# Patient Record
Sex: Female | Born: 1955 | Race: White | Hispanic: No | Smoking: Former smoker
Health system: Southern US, Community
[De-identification: ages and names within clinical notes are randomized; demographics above are authoritative.]

## PROBLEM LIST (undated history)

## (undated) DIAGNOSIS — R011 Cardiac murmur, unspecified: Secondary | ICD-10-CM

## (undated) DIAGNOSIS — D509 Iron deficiency anemia, unspecified: Secondary | ICD-10-CM

## (undated) DIAGNOSIS — R918 Other nonspecific abnormal finding of lung field: Secondary | ICD-10-CM

## (undated) DIAGNOSIS — M810 Age-related osteoporosis without current pathological fracture: Secondary | ICD-10-CM

## (undated) DIAGNOSIS — I872 Venous insufficiency (chronic) (peripheral): Secondary | ICD-10-CM

## (undated) DIAGNOSIS — D696 Thrombocytopenia, unspecified: Secondary | ICD-10-CM

## (undated) DIAGNOSIS — K7469 Other cirrhosis of liver: Secondary | ICD-10-CM

## (undated) DIAGNOSIS — K3189 Other diseases of stomach and duodenum: Secondary | ICD-10-CM

## (undated) DIAGNOSIS — D126 Benign neoplasm of colon, unspecified: Secondary | ICD-10-CM

## (undated) DIAGNOSIS — F319 Bipolar disorder, unspecified: Secondary | ICD-10-CM

## (undated) DIAGNOSIS — F419 Anxiety disorder, unspecified: Secondary | ICD-10-CM

## (undated) DIAGNOSIS — Z9289 Personal history of other medical treatment: Secondary | ICD-10-CM

## (undated) DIAGNOSIS — F32A Depression, unspecified: Secondary | ICD-10-CM

## (undated) DIAGNOSIS — E119 Type 2 diabetes mellitus without complications: Secondary | ICD-10-CM

## (undated) DIAGNOSIS — M47814 Spondylosis without myelopathy or radiculopathy, thoracic region: Secondary | ICD-10-CM

## (undated) DIAGNOSIS — Z87891 Personal history of nicotine dependence: Secondary | ICD-10-CM

## (undated) DIAGNOSIS — N200 Calculus of kidney: Secondary | ICD-10-CM

## (undated) DIAGNOSIS — I700 Atherosclerosis of aorta: Secondary | ICD-10-CM

## (undated) DIAGNOSIS — N183 Chronic kidney disease, stage 3 unspecified: Secondary | ICD-10-CM

## (undated) DIAGNOSIS — R296 Repeated falls: Secondary | ICD-10-CM

## (undated) DIAGNOSIS — Z9989 Dependence on other enabling machines and devices: Secondary | ICD-10-CM

## (undated) DIAGNOSIS — K219 Gastro-esophageal reflux disease without esophagitis: Secondary | ICD-10-CM

## (undated) DIAGNOSIS — I350 Nonrheumatic aortic (valve) stenosis: Secondary | ICD-10-CM

## (undated) DIAGNOSIS — R251 Tremor, unspecified: Secondary | ICD-10-CM

## (undated) DIAGNOSIS — I5030 Unspecified diastolic (congestive) heart failure: Secondary | ICD-10-CM

## (undated) DIAGNOSIS — Z8719 Personal history of other diseases of the digestive system: Secondary | ICD-10-CM

## (undated) DIAGNOSIS — N2889 Other specified disorders of kidney and ureter: Secondary | ICD-10-CM

## (undated) DIAGNOSIS — R161 Splenomegaly, not elsewhere classified: Secondary | ICD-10-CM

## (undated) DIAGNOSIS — I779 Disorder of arteries and arterioles, unspecified: Secondary | ICD-10-CM

## (undated) DIAGNOSIS — I10 Essential (primary) hypertension: Secondary | ICD-10-CM

## (undated) DIAGNOSIS — M1990 Unspecified osteoarthritis, unspecified site: Secondary | ICD-10-CM

## (undated) DIAGNOSIS — K439 Ventral hernia without obstruction or gangrene: Secondary | ICD-10-CM

## (undated) DIAGNOSIS — M51369 Other intervertebral disc degeneration, lumbar region without mention of lumbar back pain or lower extremity pain: Secondary | ICD-10-CM

## (undated) DIAGNOSIS — N281 Cyst of kidney, acquired: Secondary | ICD-10-CM

## (undated) DIAGNOSIS — K766 Portal hypertension: Secondary | ICD-10-CM

## (undated) DIAGNOSIS — K7460 Unspecified cirrhosis of liver: Secondary | ICD-10-CM

## (undated) DIAGNOSIS — E785 Hyperlipidemia, unspecified: Secondary | ICD-10-CM

## (undated) HISTORY — PX: JOINT REPLACEMENT: SHX530

## (undated) HISTORY — PX: ABDOMINAL HYSTERECTOMY: SHX81

## (undated) HISTORY — PX: CHOLECYSTECTOMY: SHX55

## (undated) HISTORY — DX: Nonrheumatic aortic (valve) stenosis: I35.0

## (undated) HISTORY — PX: TONSILLECTOMY: SUR1361

## (undated) MED FILL — Iron Sucrose Inj 20 MG/ML (Fe Equiv): INTRAVENOUS | Qty: 10 | Status: AC

---

## 2001-06-10 ENCOUNTER — Inpatient Hospital Stay (HOSPITAL_COMMUNITY): Admission: EM | Admit: 2001-06-10 | Discharge: 2001-06-10 | Payer: Self-pay | Admitting: *Deleted

## 2015-11-03 DIAGNOSIS — Z8614 Personal history of Methicillin resistant Staphylococcus aureus infection: Secondary | ICD-10-CM

## 2015-11-03 HISTORY — DX: Personal history of Methicillin resistant Staphylococcus aureus infection: Z86.14

## 2015-11-03 HISTORY — PX: TOTAL HIP ARTHROPLASTY: SHX124

## 2018-07-19 DIAGNOSIS — Z1211 Encounter for screening for malignant neoplasm of colon: Secondary | ICD-10-CM | POA: Insufficient documentation

## 2018-12-03 ENCOUNTER — Encounter (HOSPITAL_COMMUNITY): Payer: Self-pay | Admitting: Emergency Medicine

## 2018-12-03 ENCOUNTER — Other Ambulatory Visit: Payer: Self-pay

## 2018-12-03 ENCOUNTER — Emergency Department (HOSPITAL_COMMUNITY)
Admission: EM | Admit: 2018-12-03 | Discharge: 2018-12-03 | Disposition: A | Discharge status: Home / Self Care | Payer: Medicaid Other | Attending: Emergency Medicine | Admitting: Emergency Medicine

## 2018-12-03 ENCOUNTER — Emergency Department (HOSPITAL_COMMUNITY): Payer: Medicaid Other

## 2018-12-03 DIAGNOSIS — Y9389 Activity, other specified: Secondary | ICD-10-CM | POA: Insufficient documentation

## 2018-12-03 DIAGNOSIS — W109XXA Fall (on) (from) unspecified stairs and steps, initial encounter: Secondary | ICD-10-CM | POA: Insufficient documentation

## 2018-12-03 DIAGNOSIS — Z87891 Personal history of nicotine dependence: Secondary | ICD-10-CM | POA: Insufficient documentation

## 2018-12-03 DIAGNOSIS — S93402A Sprain of unspecified ligament of left ankle, initial encounter: Secondary | ICD-10-CM

## 2018-12-03 DIAGNOSIS — Y9289 Other specified places as the place of occurrence of the external cause: Secondary | ICD-10-CM | POA: Insufficient documentation

## 2018-12-03 DIAGNOSIS — S99912A Unspecified injury of left ankle, initial encounter: Secondary | ICD-10-CM | POA: Diagnosis present

## 2018-12-03 DIAGNOSIS — Y998 Other external cause status: Secondary | ICD-10-CM | POA: Diagnosis not present

## 2018-12-03 HISTORY — DX: Bipolar disorder, unspecified: F31.9

## 2018-12-03 HISTORY — DX: Type 2 diabetes mellitus without complications: E11.9

## 2018-12-03 HISTORY — DX: Essential (primary) hypertension: I10

## 2018-12-03 IMAGING — DX DG ANKLE COMPLETE 3+V*R*
3 series · 3 of 3 positions shown · non-contrast
Comparison: None.

CLINICAL DATA: Right ankle pain after fall yesterday.

EXAM:
RIGHT ANKLE - COMPLETE 3+ VIEW

[ankle ap]
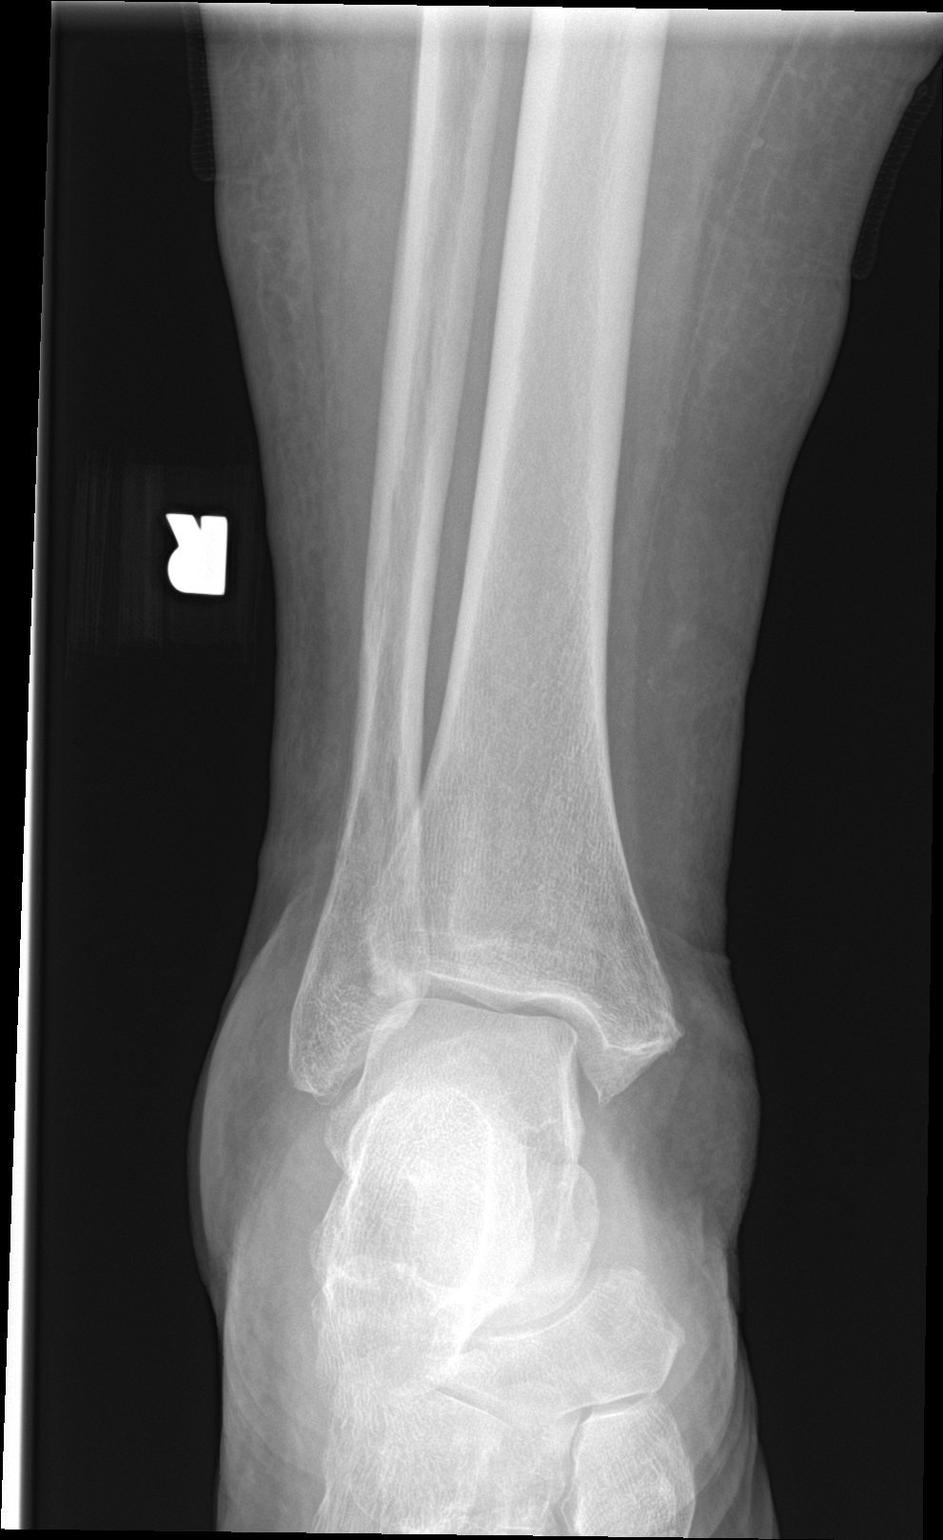

[ankle obl]
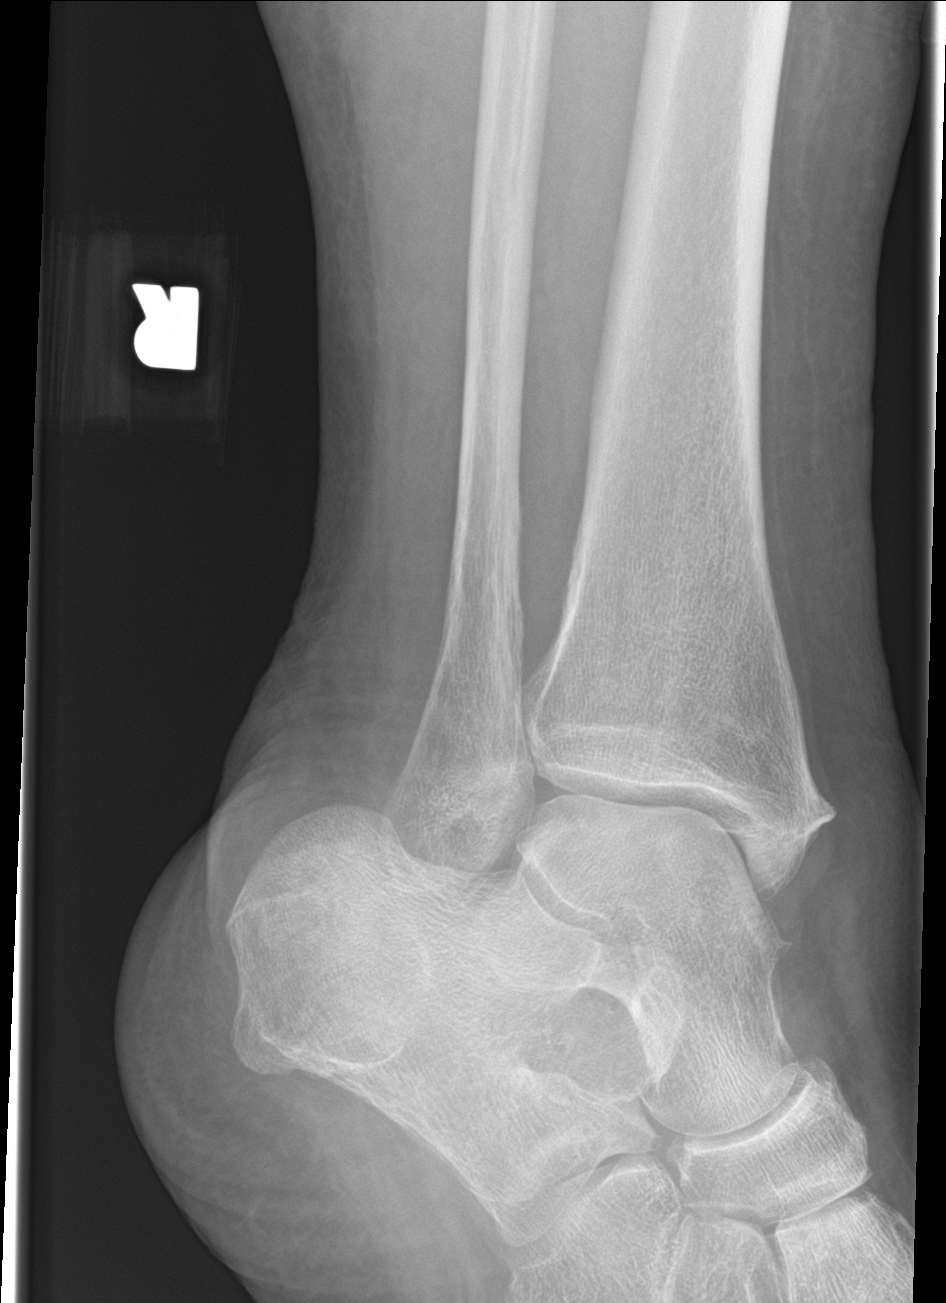

[ankle lat]
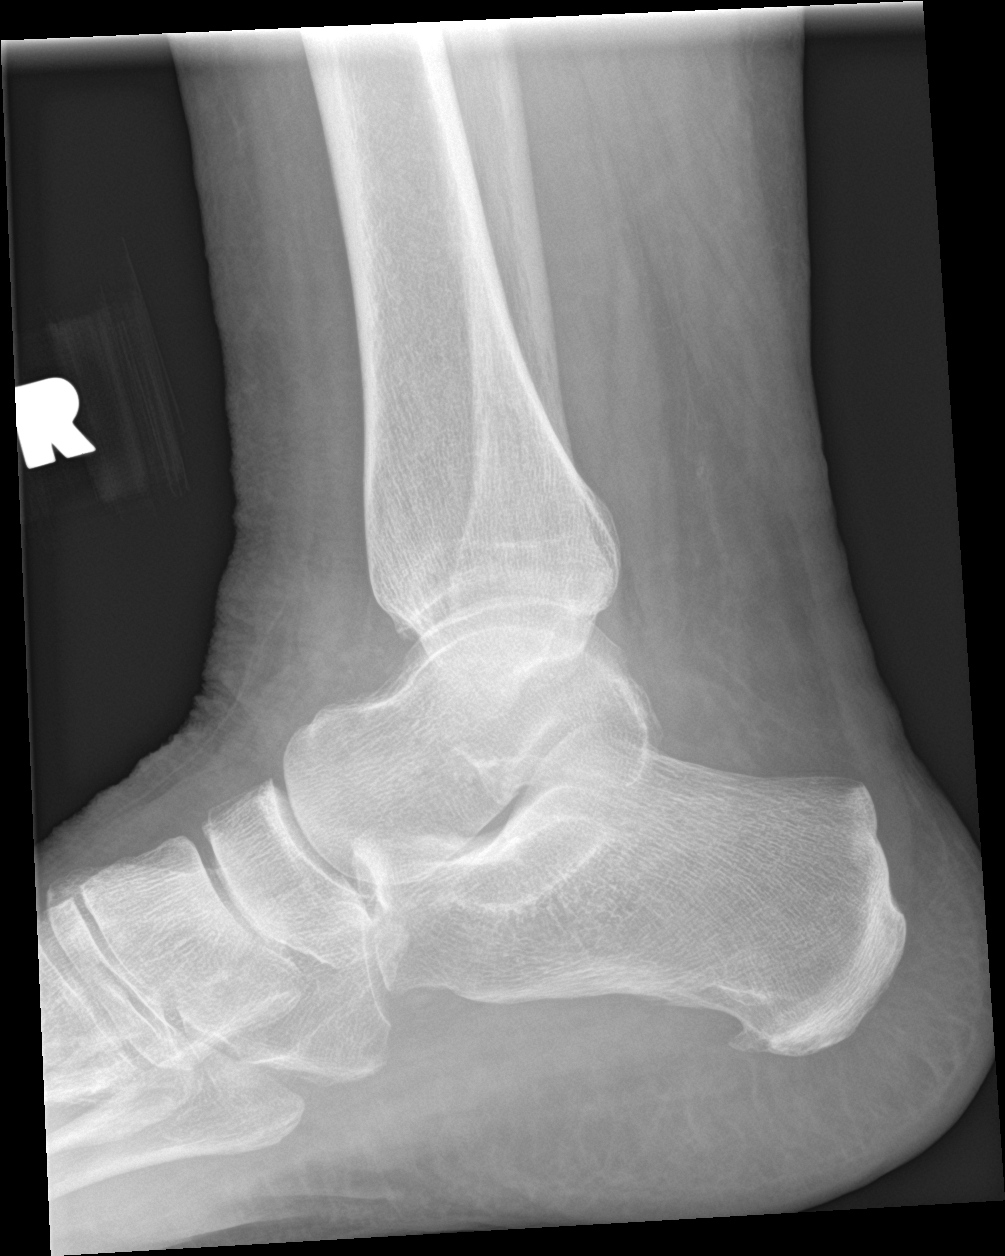

[3 of 3 positions shown; findings below may reference images not displayed]

FINDINGS: There is no evidence of fracture, dislocation, or joint effusion.
There is no evidence of arthropathy or other focal bone abnormality.
Soft tissues are unremarkable.
IMPRESSION: Negative.

## 2018-12-03 NOTE — ED Provider Notes (Signed)
[facility] EMERGENCY DEPARTMENT Provider Note   CSN: [id number] Arrival date & time: 12/03/18  1205     History   Chief Complaint Chief Complaint  Patient presents with  . Ankle Pain    HPI Margaret [name] is a 63 y.o. female.  The history is provided by the patient. No language interpreter was used.  Ankle Pain  Location:  Ankle Injury: no   Pain details:    Quality:  Aching   Radiates to:  Does not radiate   Severity:  No pain   Timing:  Constant Chronicity:  New Tetanus status:  Up to date Prior injury to area:  Yes Relieved by:  Nothing Worsened by:  Nothing Ineffective treatments:  None tried Risk factors: no recent illness   Pt complains pain and swelling in her ankle.  Pt reports she had a hip replacement and she stepped sideways on a step   Past Medical History:  Diagnosis Date  . Bipolar 1 disorder ([place])   . Diabetes mellitus without complication (HCC)    Type 2  . Hypertension     There are no active problems to display for this patient.   Past Surgical History:  Procedure Laterality Date  . ABDOMINAL HYSTERECTOMY    . CHOLECYSTECTOMY    . TONSILLECTOMY       OB History   No obstetric history on file.      Home Medications    Prior to Admission medications   Not on File    Family History No family history on file.  Social History Social History   Tobacco Use  . Smoking status: Former Smoker    Types: Cigarettes    Last attempt to quit: 03/03/2011    Years since quitting: 7.7  . Smokeless tobacco: Never Used  Substance Use Topics  . Alcohol use: Not Currently  . Drug use: Not Currently     Allergies   Penicillins and Sulfa antibiotics   Review of Systems Review of Systems  All other systems reviewed and are negative.    Physical Exam Updated Vital Signs BP (!) 141/52 (BP Location: Right Arm)   Pulse 64   Temp 98.1 F (36.7 C) (Oral)   Resp 16   Ht 5\' 1"  (1.549 m)   Wt 82.1 kg   SpO2 97%   BMI 34.20 kg/m    Physical Exam Vitals signs and nursing note reviewed.  Constitutional:      General: She is not in acute distress.    Appearance: She is well-developed.  HENT:     Head: Normocephalic and atraumatic.  Eyes:     Conjunctiva/sclera: Conjunctivae normal.  Neck:     Musculoskeletal: Neck supple.  Cardiovascular:     Rate and Rhythm: Normal rate and regular rhythm.     Heart sounds: No murmur.  Pulmonary:     Effort: Pulmonary effort is normal. No respiratory distress.     Breath sounds: Normal breath sounds.  Abdominal:     Palpations: Abdomen is soft.     Tenderness: There is no abdominal tenderness.  Musculoskeletal:        General: Swelling present.     Comments: Tender lateral malleolus, pain with range of motion  nv and ns intact   Skin:    General: Skin is warm and dry.  Neurological:     General: No focal deficit present.     Mental Status: She is alert.  Psychiatric:        Mood  and Affect: Mood normal.      ED Treatments / Results  Labs (all labs ordered are listed, but only abnormal results are displayed) Labs Reviewed - No data to display  EKG None  Radiology Dg Ankle Complete Right  Result Date: 12/03/2018 CLINICAL DATA:  Right ankle pain after fall yesterday. EXAM: RIGHT ANKLE - COMPLETE 3+ VIEW COMPARISON:  None. FINDINGS: There is no evidence of fracture, dislocation, or joint effusion. There is no evidence of arthropathy or other focal bone abnormality. Soft tissues are unremarkable. IMPRESSION: Negative. Electronically Signed   By: Marijo Conception, M.D.   On: 12/03/2018 13:13    Procedures Procedures (including critical care time)  Medications Ordered in ED Medications - No data to display   Initial Impression / Assessment and Plan / ED Course  I have reviewed the triage vital signs and the nursing notes.  Pertinent labs & imaging results that were available during my care of the patient were reviewed by me and considered in my medical  decision making (see chart for details).     Xray  No fracture.   Pt placed in an aso.  Pt has naprosyn and hydrocodone at home.   Final Clinical Impressions(s) / ED Diagnoses   Final diagnoses:  Sprain of left ankle, unspecified ligament, initial encounter    ED Discharge Orders    None    An After Visit Summary was printed and given to the patient.    Fransico Meadow, [place] 12/03/18 1641    Noemi Chapel, MD 12/04/18 [phone]

## 2018-12-03 NOTE — ED Notes (Signed)
KS in to assess 

## 2018-12-03 NOTE — Discharge Instructions (Addendum)
Use your walker.  Elevate foot and ankle.  See your Physician for recheck in 3-4 days.

## 2018-12-03 NOTE — ED Triage Notes (Signed)
Pt c/o RT ankle pain and edema that began after a fall yesterday. Pt has used ace wrap, naproxen (1000 this AM), & hydrocodone (Noon) today. Reports mild relief with interventions.

## 2018-12-03 NOTE — ED Notes (Signed)
Hip replacement 1 yr ago still gait problems Walking down stairs last pm  Knee gave out and during night, ankle pain and ankle/foot swelling  N/V intact  Swelling to R foot   Tender over lateal malleolus

## 2019-06-21 ENCOUNTER — Other Ambulatory Visit: Payer: Self-pay | Admitting: Internal Medicine

## 2019-06-21 ENCOUNTER — Inpatient Hospital Stay
Admission: EM | Admit: 2019-06-21 | Discharge: 2019-06-24 | DRG: 812 | Disposition: A | Discharge status: Home / Self Care | Payer: Medicaid Other | Attending: Internal Medicine | Admitting: Internal Medicine

## 2019-06-21 ENCOUNTER — Other Ambulatory Visit: Payer: Self-pay

## 2019-06-21 ENCOUNTER — Encounter: Payer: Self-pay | Admitting: Emergency Medicine

## 2019-06-21 ENCOUNTER — Ambulatory Visit
Admission: RE | Admit: 2019-06-21 | Discharge: 2019-06-21 | Disposition: A | Discharge status: Home / Self Care | Payer: Medicaid Other | Source: Ambulatory Visit | Attending: Internal Medicine | Admitting: Internal Medicine

## 2019-06-21 DIAGNOSIS — Z20828 Contact with and (suspected) exposure to other viral communicable diseases: Secondary | ICD-10-CM | POA: Diagnosis present

## 2019-06-21 DIAGNOSIS — E1122 Type 2 diabetes mellitus with diabetic chronic kidney disease: Secondary | ICD-10-CM | POA: Diagnosis present

## 2019-06-21 DIAGNOSIS — Z87891 Personal history of nicotine dependence: Secondary | ICD-10-CM | POA: Diagnosis not present

## 2019-06-21 DIAGNOSIS — D125 Benign neoplasm of sigmoid colon: Secondary | ICD-10-CM | POA: Diagnosis present

## 2019-06-21 DIAGNOSIS — Z79899 Other long term (current) drug therapy: Secondary | ICD-10-CM

## 2019-06-21 DIAGNOSIS — N183 Chronic kidney disease, stage 3 unspecified: Secondary | ICD-10-CM | POA: Diagnosis present

## 2019-06-21 DIAGNOSIS — M159 Polyosteoarthritis, unspecified: Secondary | ICD-10-CM | POA: Insufficient documentation

## 2019-06-21 DIAGNOSIS — D509 Iron deficiency anemia, unspecified: Secondary | ICD-10-CM

## 2019-06-21 DIAGNOSIS — F319 Bipolar disorder, unspecified: Secondary | ICD-10-CM | POA: Diagnosis present

## 2019-06-21 DIAGNOSIS — R140 Abdominal distension (gaseous): Secondary | ICD-10-CM

## 2019-06-21 DIAGNOSIS — Z90710 Acquired absence of both cervix and uterus: Secondary | ICD-10-CM | POA: Diagnosis not present

## 2019-06-21 DIAGNOSIS — I129 Hypertensive chronic kidney disease with stage 1 through stage 4 chronic kidney disease, or unspecified chronic kidney disease: Secondary | ICD-10-CM | POA: Diagnosis present

## 2019-06-21 DIAGNOSIS — R5383 Other fatigue: Secondary | ICD-10-CM | POA: Diagnosis present

## 2019-06-21 DIAGNOSIS — Z794 Long term (current) use of insulin: Secondary | ICD-10-CM | POA: Diagnosis not present

## 2019-06-21 DIAGNOSIS — I10 Essential (primary) hypertension: Secondary | ICD-10-CM | POA: Diagnosis present

## 2019-06-21 DIAGNOSIS — D128 Benign neoplasm of rectum: Secondary | ICD-10-CM | POA: Diagnosis present

## 2019-06-21 DIAGNOSIS — M1990 Unspecified osteoarthritis, unspecified site: Secondary | ICD-10-CM | POA: Diagnosis present

## 2019-06-21 DIAGNOSIS — E119 Type 2 diabetes mellitus without complications: Secondary | ICD-10-CM

## 2019-06-21 DIAGNOSIS — Z23 Encounter for immunization: Secondary | ICD-10-CM

## 2019-06-21 DIAGNOSIS — D649 Anemia, unspecified: Secondary | ICD-10-CM | POA: Diagnosis present

## 2019-06-21 DIAGNOSIS — R161 Splenomegaly, not elsewhere classified: Secondary | ICD-10-CM | POA: Diagnosis present

## 2019-06-21 DIAGNOSIS — Z791 Long term (current) use of non-steroidal anti-inflammatories (NSAID): Secondary | ICD-10-CM | POA: Diagnosis not present

## 2019-06-21 DIAGNOSIS — K7460 Unspecified cirrhosis of liver: Secondary | ICD-10-CM | POA: Diagnosis present

## 2019-06-21 DIAGNOSIS — Z880 Allergy status to penicillin: Secondary | ICD-10-CM | POA: Diagnosis not present

## 2019-06-21 DIAGNOSIS — F3177 Bipolar disorder, in partial remission, most recent episode mixed: Secondary | ICD-10-CM | POA: Insufficient documentation

## 2019-06-21 DIAGNOSIS — Z882 Allergy status to sulfonamides status: Secondary | ICD-10-CM | POA: Diagnosis not present

## 2019-06-21 DIAGNOSIS — R188 Other ascites: Secondary | ICD-10-CM | POA: Diagnosis present

## 2019-06-21 LAB — IRON AND TIBC
Iron: 13 ug/dL — ABNORMAL LOW (ref 28–170)
Saturation Ratios: 2 % — ABNORMAL LOW (ref 10.4–31.8)
TIBC: 555 ug/dL — ABNORMAL HIGH (ref 250–450)
UIBC: 542 ug/dL

## 2019-06-21 LAB — COMPREHENSIVE METABOLIC PANEL
ALT: 25 U/L (ref 0–44)
AST: 41 U/L (ref 15–41)
Albumin: 2.7 g/dL — ABNORMAL LOW (ref 3.5–5.0)
Alkaline Phosphatase: 76 U/L (ref 38–126)
Anion gap: 7 (ref 5–15)
BUN: 10 mg/dL (ref 8–23)
CO2: 22 mmol/L (ref 22–32)
Calcium: 8.2 mg/dL — ABNORMAL LOW (ref 8.9–10.3)
Chloride: 107 mmol/L (ref 98–111)
Creatinine, Ser: 0.65 mg/dL (ref 0.44–1.00)
GFR calc Af Amer: >60 mL/min (ref >60)
GFR calc non Af Amer: >60 mL/min (ref >60)
Glucose, Bld: 111 mg/dL — ABNORMAL HIGH (ref 70–99)
Potassium: 3.7 mmol/L (ref 3.5–5.1)
Sodium: 136 mmol/L (ref 135–145)
Total Bilirubin: 0.9 mg/dL (ref 0.3–1.2)
Total Protein: 5.5 g/dL — ABNORMAL LOW (ref 6.5–8.1)

## 2019-06-21 LAB — PROTIME-INR
INR: 1.2 (ref 0.8–1.2)
Prothrombin Time: 15.5 seconds — ABNORMAL HIGH (ref 11.4–15.2)

## 2019-06-21 LAB — CBC WITH DIFFERENTIAL/PLATELET
Abs Immature Granulocytes: 0.02 10*3/uL (ref 0.00–0.07)
Basophils Absolute: 0 10*3/uL (ref 0.0–0.1)
Basophils Relative: 1 %
Eosinophils Absolute: 0.1 10*3/uL (ref 0.0–0.5)
Eosinophils Relative: 2 %
HCT: 18.8 % — ABNORMAL LOW (ref 36.0–46.0)
Hemoglobin: 5.1 g/dL — ABNORMAL LOW (ref 12.0–15.0)
Immature Granulocytes: 1 %
Lymphocytes Relative: 31 %
Lymphs Abs: 1.3 10*3/uL (ref 0.7–4.0)
MCH: 18.7 pg — ABNORMAL LOW (ref 26.0–34.0)
MCHC: 27.1 g/dL — ABNORMAL LOW (ref 30.0–36.0)
MCV: 68.9 fL — ABNORMAL LOW (ref 80.0–100.0)
Monocytes Absolute: 0.6 10*3/uL (ref 0.1–1.0)
Monocytes Relative: 13 %
Neutro Abs: 2.3 10*3/uL (ref 1.7–7.7)
Neutrophils Relative %: 52 %
Platelets: 118 10*3/uL — ABNORMAL LOW (ref 150–400)
RBC: 2.73 MIL/uL — ABNORMAL LOW (ref 3.87–5.11)
RDW: 18.8 % — ABNORMAL HIGH (ref 11.5–15.5)
WBC: 4.3 10*3/uL (ref 4.0–10.5)
nRBC: 0.5 % — ABNORMAL HIGH (ref 0.0–0.2)

## 2019-06-21 LAB — ABO/RH: ABO/RH(D): B POS

## 2019-06-21 LAB — GLUCOSE, CAPILLARY
Glucose-Capillary: 124 mg/dL — ABNORMAL HIGH (ref 70–99)
Glucose-Capillary: 101 mg/dL — ABNORMAL HIGH (ref 70–99)
Glucose-Capillary: 115 mg/dL — ABNORMAL HIGH (ref 70–99)

## 2019-06-21 LAB — PREPARE RBC (CROSSMATCH)

## 2019-06-21 LAB — SARS CORONAVIRUS 2 BY RT PCR (HOSPITAL ORDER, PERFORMED IN ~~LOC~~ HOSPITAL LAB): SARS Coronavirus 2: NEGATIVE

## 2019-06-21 LAB — APTT: aPTT: 30 seconds (ref 24–36)

## 2019-06-21 LAB — LACTATE DEHYDROGENASE: LDH: 197 U/L — ABNORMAL HIGH (ref 98–192)

## 2019-06-21 LAB — FERRITIN: Ferritin: 4 ng/mL — ABNORMAL LOW (ref 11–307)

## 2019-06-21 LAB — POCT I-STAT CREATININE: Creatinine, Ser: 0.7 mg/dL (ref 0.44–1.00)

## 2019-06-21 IMAGING — CT CT ABDOMEN AND PELVIS WITH CONTRAST
2 of 5 series · 16 of 46 positions shown, 18 images · IV contrast (APPLIED)
Comparison: None.

CLINICAL DATA: Fall, possible hernia, abdominal swelling and weight
gain

EXAM:
CT ABDOMEN AND PELVIS WITH CONTRAST
TECHNIQUE: Multidetector CT imaging of the abdomen and pelvis was performed
using the standard protocol following bolus administration of
intravenous contrast.
CONTRAST:  100mL OMNIPAQUE IOHEXOL 300 MG/ML SOLN, additional oral
enteric contrast

[Series 2: axial (person_name) (person_name) · axial · 0.96mm/px · z∈[-495,-95]mm · 13 of 91 slices shown, 15 images]
[im 6/91  soft-tissue]
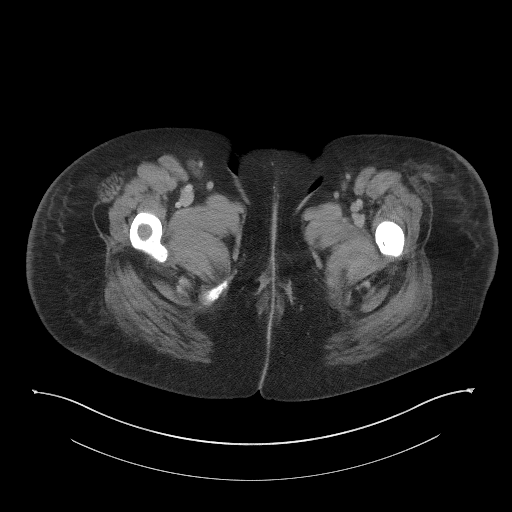
[im 6/91  bone]
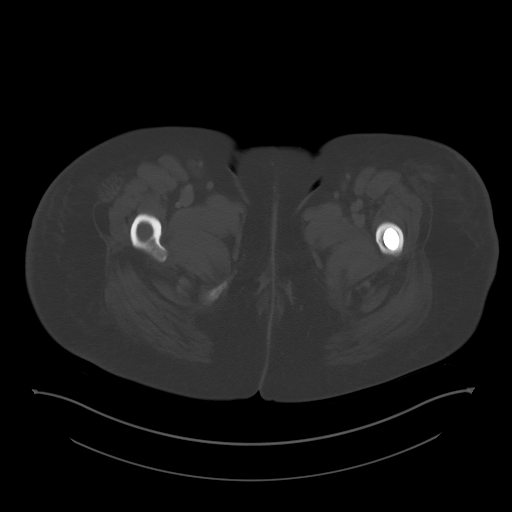
[im 11/91  soft-tissue]
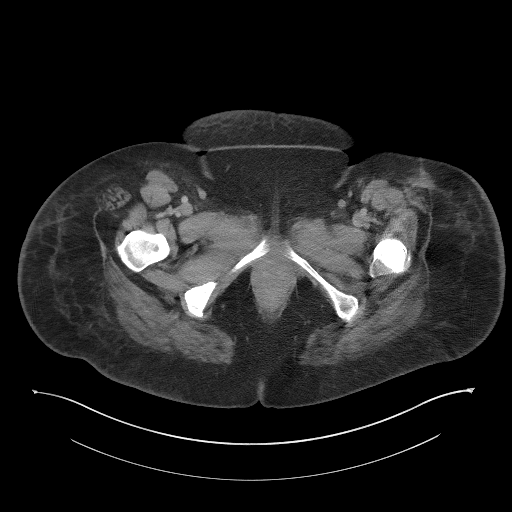
[im 21/91  soft-tissue]
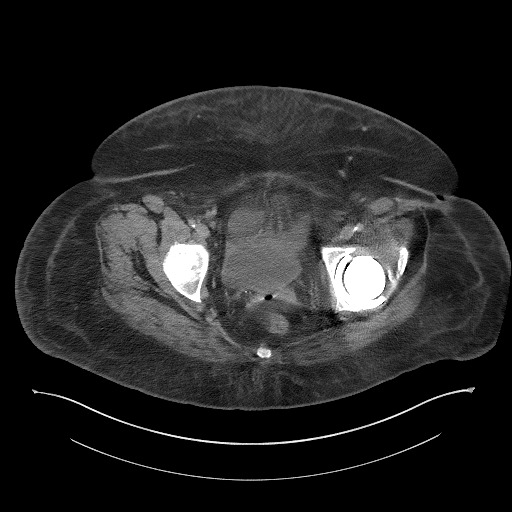
[im 26/91  soft-tissue]
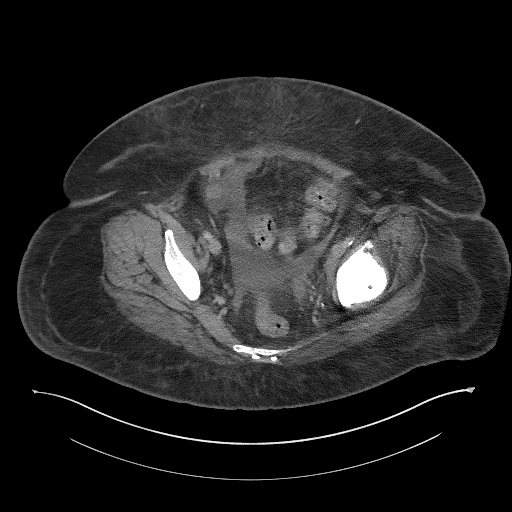
[im 31/91  soft-tissue]
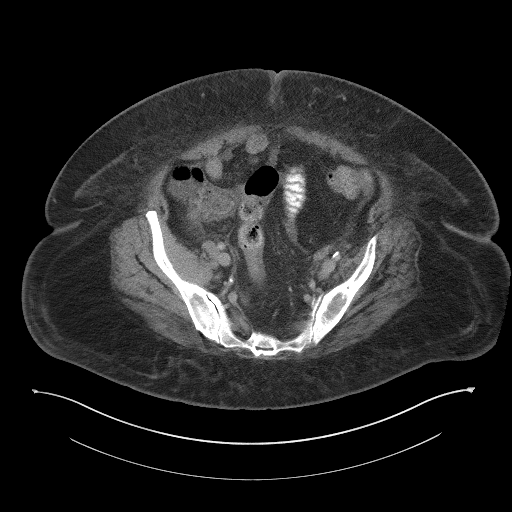
[im 41/91  soft-tissue]
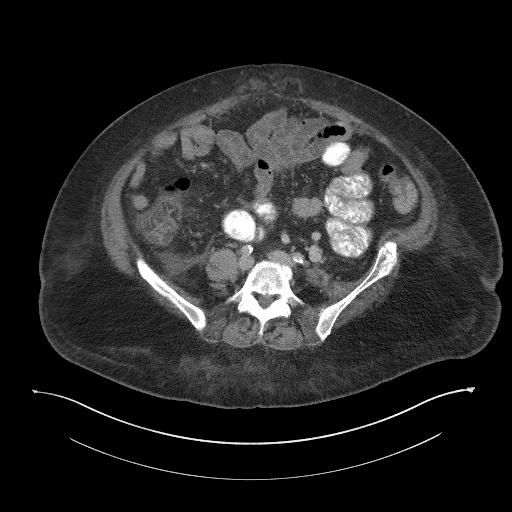
[im 46/91  soft-tissue]
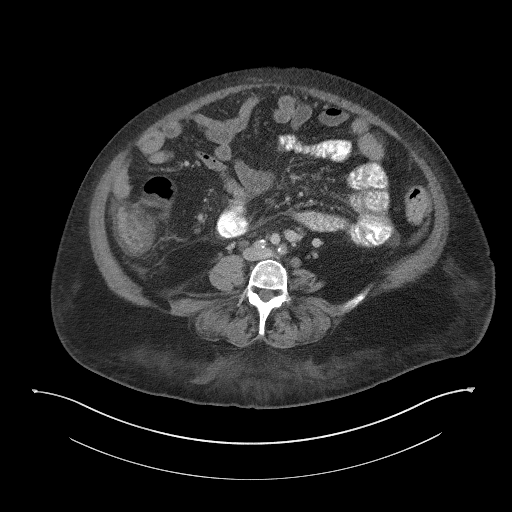
[im 51/91  soft-tissue]
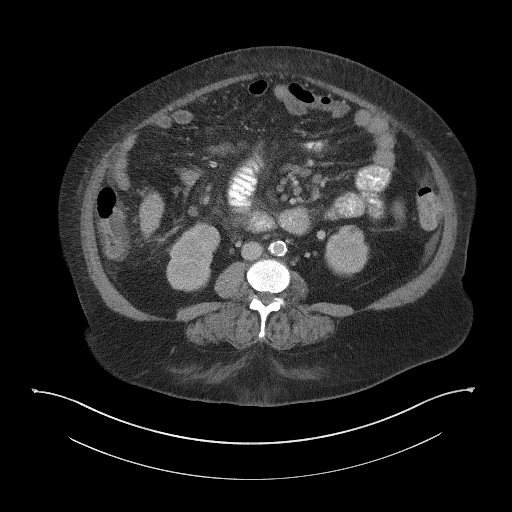
[im 61/91  soft-tissue]
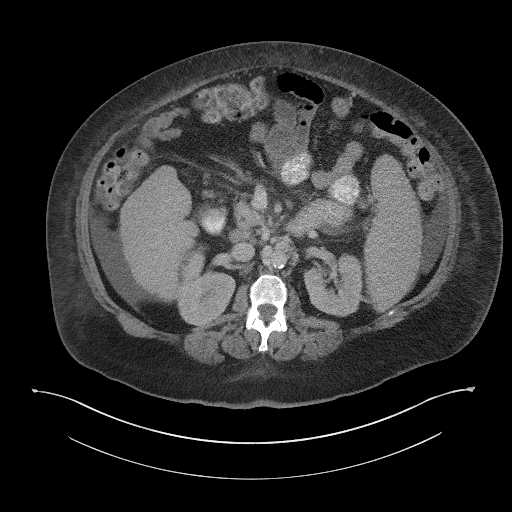
[im 61/91  bone]
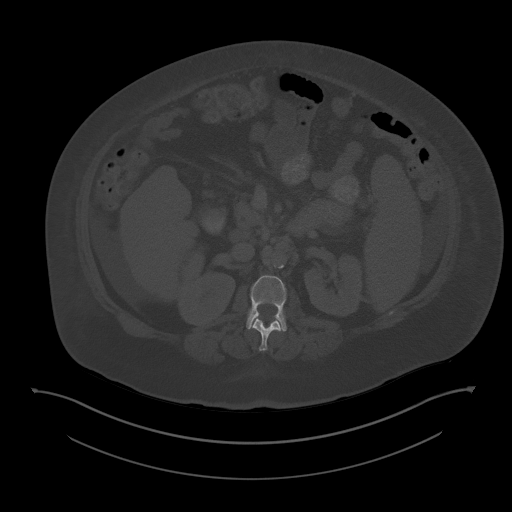
[im 66/91  soft-tissue]
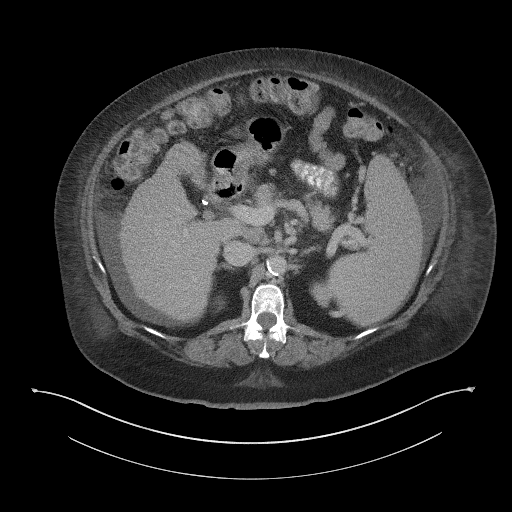
[im 71/91  soft-tissue]
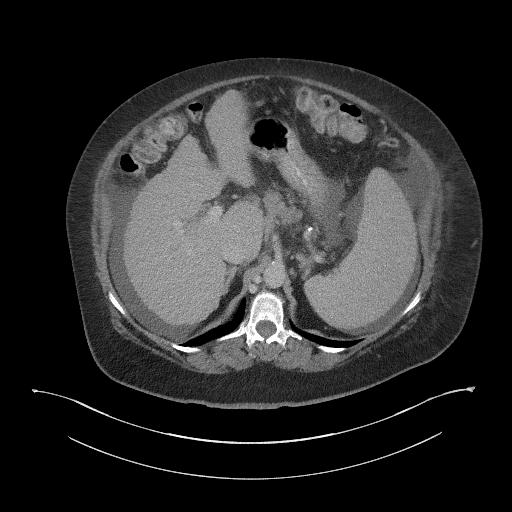
[im 81/91  soft-tissue]
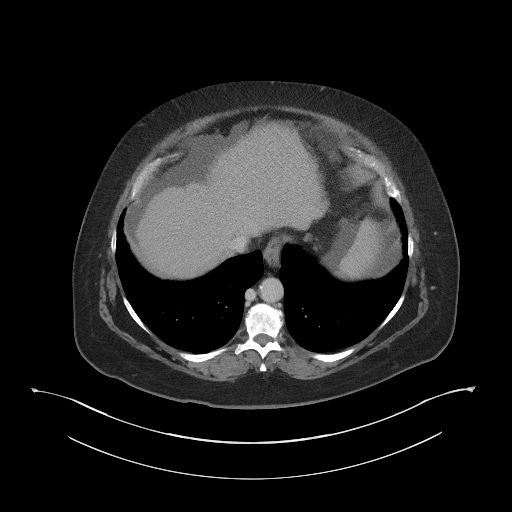
[im 86/91  soft-tissue]
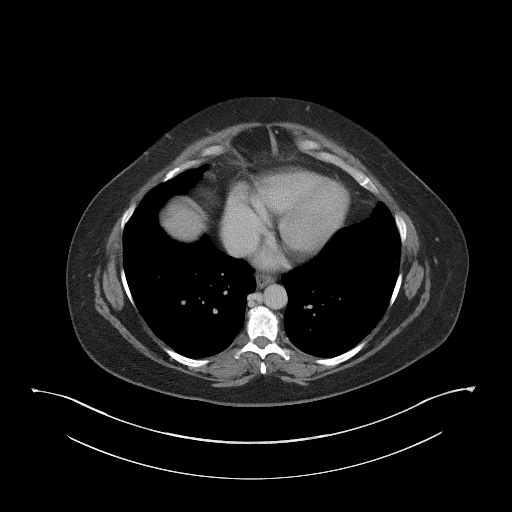

[Series 5: coronal st · coronal · 0.91mm/px · 3 of 111 slices shown]
[im 37/111  soft-tissue]
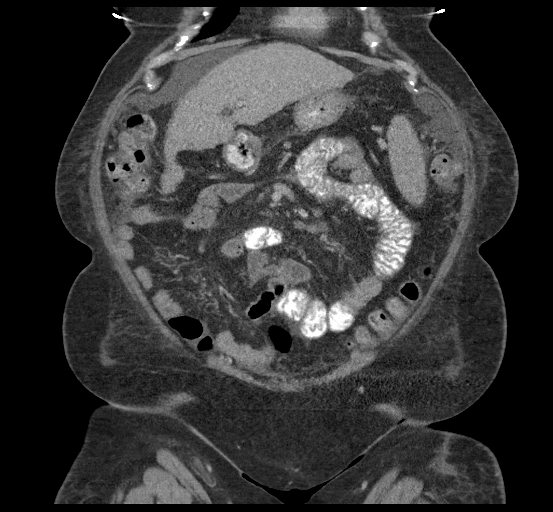
[im 49/111  soft-tissue]
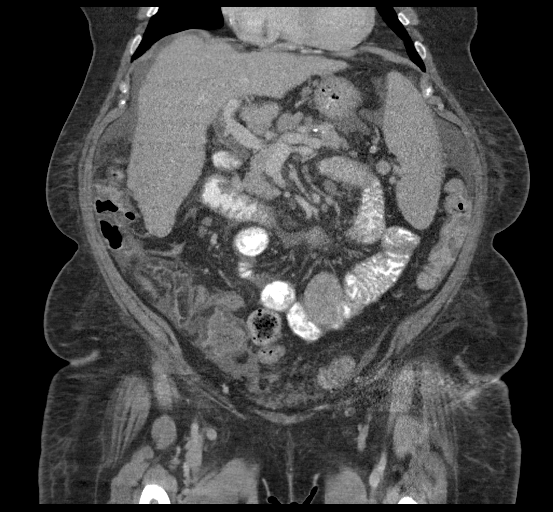
[im 62/111  soft-tissue]
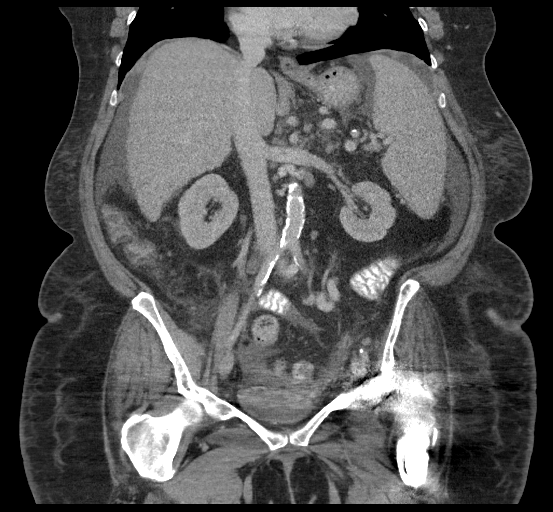

[16 of 46 positions shown; findings below may reference images not displayed]

FINDINGS: Lower chest: No acute abnormality.

Hepatobiliary: Coarse, nodular contour of the liver. Status post
cholecystectomy. No biliary ductal dilatation.

Pancreas: Unremarkable. No pancreatic ductal dilatation or
surrounding inflammatory changes.

Spleen: Splenomegaly, maximum coronal span 15.7 cm

Adrenals/Urinary Tract: Adrenal glands are unremarkable. Kidneys are
normal, without renal calculi, solid lesion, or hydronephrosis.
Bladder is unremarkable.

Stomach/Bowel: Stomach is within normal limits. Appendix appears
normal. No evidence of bowel wall thickening, distention, or
inflammatory changes.

Vascular/Lymphatic: Aortic atherosclerosis. No enlarged abdominal or
pelvic lymph nodes.

Reproductive: Status post hysterectomy.

Other: There is a small, broad-based fat containing midline ventral
hernia measuring 5.7 x 3.2 x 2.2 cm (series 2, image 53, series 6,
image 70). Small volume ascites.

Musculoskeletal: No acute or significant osseous findings. Status
post left hip total arthroplasty.
IMPRESSION: 1.  Stigmata of cirrhosis, splenomegaly, and small volume ascites.

2. There is a small, broad-based fat containing midline ventral
hernia measuring 5.7 x 3.2 x 2.2 cm (series 2, image 53, series 6,
image 70).

3. Other chronic, incidental, and postoperative findings as detailed
above.

## 2019-06-21 MED ORDER — SODIUM CHLORIDE 0.9 % IV SOLN
10.0000 mL/h | Freq: Once | INTRAVENOUS | Status: AC
  Administered 2019-06-21: 19:00:00 10 mL/h via INTRAVENOUS

## 2019-06-21 MED ORDER — ACETAMINOPHEN 650 MG RE SUPP: 650.0000 mg | Freq: Four times a day (QID) | RECTAL | Status: DC | PRN

## 2019-06-21 MED ORDER — IOHEXOL 300 MG/ML  SOLN
100.0000 mL | Freq: Once | INTRAMUSCULAR | Status: AC | PRN
  Administered 2019-06-21: 16:00:00 100 mL via INTRAVENOUS

## 2019-06-21 MED ORDER — ONDANSETRON HCL 4 MG PO TABS: 4.0000 mg | ORAL_TABLET | Freq: Four times a day (QID) | ORAL | Status: DC | PRN

## 2019-06-21 MED ORDER — ONDANSETRON HCL 4 MG/2ML IJ SOLN: 4.0000 mg | Freq: Four times a day (QID) | INTRAMUSCULAR | Status: DC | PRN

## 2019-06-21 MED ORDER — INSULIN ASPART 100 UNIT/ML ~~LOC~~ SOLN
0.0000 [IU] | Freq: Three times a day (TID) | SUBCUTANEOUS | Status: DC
  Administered 2019-06-23: 2 [IU] via SUBCUTANEOUS
  Administered 2019-06-23: 1 [IU] via SUBCUTANEOUS
  Administered 2019-06-24: 13:00:00 3 [IU] via SUBCUTANEOUS
  Administered 2019-06-24: 09:00:00 1 [IU] via SUBCUTANEOUS
  Filled 2019-06-21 (×4): qty 1

## 2019-06-21 MED ORDER — ACETAMINOPHEN 325 MG PO TABS: 650.0000 mg | ORAL_TABLET | Freq: Four times a day (QID) | ORAL | Status: DC | PRN

## 2019-06-21 MED ORDER — PANTOPRAZOLE SODIUM 40 MG PO TBEC
40.0000 mg | DELAYED_RELEASE_TABLET | Freq: Every day | ORAL | Status: DC
  Administered 2019-06-22 – 2019-06-24 (×3): 40 mg via ORAL
  Filled 2019-06-21 (×3): qty 1

## 2019-06-21 MED ORDER — ARIPIPRAZOLE 2 MG PO TABS
2.0000 mg | ORAL_TABLET | Freq: Every day | ORAL | Status: DC
  Administered 2019-06-22 – 2019-06-24 (×3): 2 mg via ORAL
  Filled 2019-06-21 (×3): qty 1

## 2019-06-21 MED ORDER — PNEUMOCOCCAL VAC POLYVALENT 25 MCG/0.5ML IJ INJ
0.5000 mL | INJECTION | INTRAMUSCULAR | Status: AC
  Administered 2019-06-22: 09:00:00 0.5 mL via INTRAMUSCULAR
  Filled 2019-06-21: qty 0.5

## 2019-06-21 MED ORDER — ARIPIPRAZOLE 5 MG PO TABS
5.0000 mg | ORAL_TABLET | Freq: Every day | ORAL | Status: DC
  Administered 2019-06-22 – 2019-06-24 (×3): 5 mg via ORAL
  Filled 2019-06-21 (×3): qty 1

## 2019-06-21 MED ORDER — CITALOPRAM HYDROBROMIDE 20 MG PO TABS
40.0000 mg | ORAL_TABLET | Freq: Every day | ORAL | Status: DC
  Administered 2019-06-22 – 2019-06-24 (×3): 40 mg via ORAL
  Filled 2019-06-21 (×3): qty 2

## 2019-06-21 MED ORDER — INSULIN ASPART 100 UNIT/ML ~~LOC~~ SOLN: 0.0000 [IU] | Freq: Every day | SUBCUTANEOUS | Status: DC

## 2019-06-21 MED ORDER — LAMOTRIGINE 100 MG PO TABS
200.0000 mg | ORAL_TABLET | Freq: Every day | ORAL | Status: DC
  Administered 2019-06-22 – 2019-06-24 (×3): 200 mg via ORAL
  Filled 2019-06-21 (×3): qty 2

## 2019-06-21 MED ORDER — AMLODIPINE BESYLATE 10 MG PO TABS
10.0000 mg | ORAL_TABLET | Freq: Every day | ORAL | Status: DC
  Administered 2019-06-22 – 2019-06-24 (×3): 10 mg via ORAL
  Filled 2019-06-21 (×3): qty 1

## 2019-06-21 NOTE — ED Notes (Signed)
Pt states she feels a lot better already. States it is much easier to breathe.

## 2019-06-21 NOTE — ED Notes (Signed)
Pt given food tray and diet soda verbal okay from [place]. Friend at bedside given water as requested.

## 2019-06-21 NOTE — ED Notes (Signed)
EDP [place] to bedside. Pt reports fall on the 1st of the month.

## 2019-06-21 NOTE — ED Provider Notes (Signed)
[facility] Emergency Department Provider Note  ____________________________________________   First MD Initiated Contact with Patient 06/21/19 1652     (approximate)  I have reviewed the triage vital signs and the nursing notes.   HISTORY  Chief Complaint Abnormal Lab    HPI Margaret [name] is a 63 y.o. female with diabetes, hypertension who presents for abnormal lab.  Patient presents for a hemoglobin of 5.7.  Patient is had progressive abdominal pain and distention after having a fall at the beginning of the month.  Due to the pain her physician ordered a CT scan to make sure there is no evidence of bleeding.  CT abdomen was ordered which showed cirrhosis, splenomegaly, small volume ascites.  Patient endorses having a history of concern for cirrhosis but had a biopsy done many years ago that was normal.  She is unaware of having this splenomegaly or ascites.  She denies any alcohol or drug use.   Patient also had a ventral hernia.  Patient denies any vaginal bleeding.  No dark stools that she knows of.  No vomiting blood.  Patient ambulates with a walker due to her hip fracture.  She is not sure if she is been having any dark stools.  Low hemoglobin unclear when it started, constant, nothing makes it better, nothing makes it worse.  No severe associated symptoms.            Past Medical History:  Diagnosis Date  . Bipolar 1 disorder ([place])   . Diabetes mellitus without complication (HCC)    Type 2  . Hypertension     There are no active problems to display for this patient.   Past Surgical History:  Procedure Laterality Date  . ABDOMINAL HYSTERECTOMY    . CHOLECYSTECTOMY    . TONSILLECTOMY      Prior to Admission medications   Not on File    Allergies Penicillins and Sulfa antibiotics  No family history on file.  Social History Social History   Tobacco Use  . Smoking status: Former Smoker    Types: Cigarettes    Quit date:  03/03/2011    Years since quitting: 8.3  . Smokeless tobacco: Never Used  Substance Use Topics  . Alcohol use: Not Currently  . Drug use: Not Currently      Review of Systems Constitutional: No fever/chills, positive abnormal lab Eyes: No visual changes. ENT: No sore throat. Cardiovascular: Denies chest pain. Respiratory: Denies shortness of breath. Gastrointestinal: Positive abdominal pain.  No nausea, no vomiting.  No diarrhea.  No constipation. Genitourinary: Negative for dysuria. Musculoskeletal: Negative for back pain. Skin: Negative for rash. Neurological: Negative for headaches, focal weakness or numbness. All other ROS negative ____________________________________________   PHYSICAL EXAM:  VITAL SIGNS: ED Triage Vitals  Enc Vitals Group     BP 06/21/19 1641 (!) 149/42     Pulse Rate 06/21/19 1641 89     Resp 06/21/19 1641 17     Temp 06/21/19 1641 98.7 F (37.1 C)     Temp Source 06/21/19 1641 Oral     SpO2 06/21/19 1641 100 %     Weight 06/21/19 1640 206 lb (93.4 kg)     Height 06/21/19 1640 5' 1.5" (1.562 m)     Head Circumference --      Peak Flow --      Pain Score 06/21/19 1640 7     Pain Loc --      Pain Edu? --  Excl. in [place]? --     Constitutional: Alert and oriented. Well appearing and in no acute distress. Eyes: Conjunctivae are normal. EOMI. Head: Atraumatic. Nose: No congestion/rhinnorhea. Mouth/Throat: Mucous membranes are moist.   Neck: No stridor. Trachea Midline. FROM Cardiovascular: Normal rate, regular rhythm. Grossly normal heart sounds.  Good peripheral circulation. Respiratory: Normal respiratory effort.  No retractions. Lungs CTAB. Gastrointestinal: Soft and nontender. No distention. No abdominal bruits.  Musculoskeletal: No lower extremity tenderness nor edema.  No joint effusions. Neurologic:  Normal speech and language. No gross focal neurologic deficits are appreciated.  Skin:  Skin is warm, dry and intact. No rash noted.  Psychiatric: Mood and affect are normal. Speech and behavior are normal. GU: Rectal exam with brown stool but Hemoccult positive ____________________________________________   LABS (all labs ordered are listed, but only abnormal results are displayed)  Labs Reviewed  GLUCOSE, CAPILLARY - Abnormal; Notable for the following components:      Result Value   Glucose-Capillary 124 (*)    All other components within normal limits  CBC WITH DIFFERENTIAL/PLATELET - Abnormal; Notable for the following components:   RBC 2.73 (*)    Hemoglobin 5.1 (*)    HCT 18.8 (*)    MCV 68.9 (*)    MCH 18.7 (*)    MCHC 27.1 (*)    RDW 18.8 (*)    Platelets 118 (*)    nRBC 0.5 (*)    All other components within normal limits  COMPREHENSIVE METABOLIC PANEL - Abnormal; Notable for the following components:   Glucose, Bld 111 (*)    Calcium 8.2 (*)    Total Protein 5.5 (*)    Albumin 2.7 (*)    All other components within normal limits  PROTIME-INR - Abnormal; Notable for the following components:   Prothrombin Time 15.5 (*)    All other components within normal limits  IRON AND TIBC - Abnormal; Notable for the following components:   Iron 13 (*)    TIBC 555 (*)    Saturation Ratios 2 (*)    All other components within normal limits  FERRITIN - Abnormal; Notable for the following components:   Ferritin 4 (*)    All other components within normal limits  LACTATE DEHYDROGENASE - Abnormal; Notable for the following components:   LDH 197 (*)    All other components within normal limits  GLUCOSE, CAPILLARY - Abnormal; Notable for the following components:   Glucose-Capillary 101 (*)    All other components within normal limits  SARS CORONAVIRUS 2 (HOSPITAL ORDER, [facility])  APTT  HAPTOGLOBIN  TYPE AND SCREEN  PREPARE RBC (CROSSMATCH)  ABO/RH   ____________________________________________    RADIOLOGY   Official radiology report(s): Ct Abdomen Pelvis W  Contrast  Result Date: 06/21/2019 CLINICAL DATA:  Fall, possible hernia, abdominal swelling and weight gain EXAM: CT ABDOMEN AND PELVIS WITH CONTRAST TECHNIQUE: Multidetector CT imaging of the abdomen and pelvis was performed using the standard protocol following bolus administration of intravenous contrast. CONTRAST:  138mL OMNIPAQUE IOHEXOL 300 MG/ML SOLN, additional oral enteric contrast COMPARISON:  None. FINDINGS: Lower chest: No acute abnormality. Hepatobiliary: Coarse, nodular contour of the liver. Status post cholecystectomy. No biliary ductal dilatation. Pancreas: Unremarkable. No pancreatic ductal dilatation or surrounding inflammatory changes. Spleen: Splenomegaly, maximum coronal span 15.7 cm Adrenals/Urinary Tract: Adrenal glands are unremarkable. Kidneys are normal, without renal calculi, solid lesion, or hydronephrosis. Bladder is unremarkable. Stomach/Bowel: Stomach is within normal limits. Appendix appears normal. No evidence  of bowel wall thickening, distention, or inflammatory changes. Vascular/Lymphatic: Aortic atherosclerosis. No enlarged abdominal or pelvic lymph nodes. Reproductive: Status post hysterectomy. Other: There is a small, broad-based fat containing midline ventral hernia measuring 5.7 x 3.2 x 2.2 cm (series 2, image 53, series 6, image 70). Small volume ascites. Musculoskeletal: No acute or significant osseous findings. Status post left hip total arthroplasty. IMPRESSION: 1.  Stigmata of cirrhosis, splenomegaly, and small volume ascites. 2. There is a small, broad-based fat containing midline ventral hernia measuring 5.7 x 3.2 x 2.2 cm (series 2, image 53, series 6, image 70). 3. Other chronic, incidental, and postoperative findings as detailed above. Electronically Signed   By: Eddie Candle M.D.   On: 06/21/2019 16:36    ____________________________________________   PROCEDURES  Procedure(s) performed (including Critical Care):  .Critical Care Performed by: Vanessa [place], MD Authorized by: Vanessa Dugger, MD   Critical care provider statement:    Critical care time (minutes):  30   Critical care was necessary to treat or prevent imminent or life-threatening deterioration of the following conditions: anemia requiring blood.   Critical care was time spent personally by me on the following activities:  Discussions with consultants, evaluation of patient's response to treatment, examination of patient, ordering and performing treatments and interventions, ordering and review of laboratory studies, ordering and review of radiographic studies, pulse oximetry, re-evaluation of patient's condition, obtaining history from patient or surrogate and review of old charts     ____________________________________________   INITIAL IMPRESSION / [place] / ED COURSE  Margaret [name] was evaluated in Emergency Department on 06/21/2019 for the symptoms described in the history of present illness. She was evaluated in the context of the global COVID-19 pandemic, which necessitated consideration that the patient might be at risk for infection with the SARS-CoV-2 virus that causes COVID-19. Institutional protocols and algorithms that pertain to the evaluation of patients at risk for COVID-19 are in a state of rapid change based on information released by regulatory bodies including the CDC and federal and state organizations. These policies and algorithms were followed during the patient's care in the ED.    Patient presents with anemia.  CT scan does not show any evidence of intra-abdominal bleeding.  Will do a rectal exam to look for melena for GI bleed.  Will get labs to evaluate for iron deficiency versus hemolytic anemia.  MCV is low therefore low suspicion for B12 or folate deficiency.  Patient's labs are notable for a low iron.  Hemoglobin was 5.1.  Rectal exam without any evidence of melena.  Was Hemoccult positive.  We will give 2 units of blood given a hemoglobin  of 5.1.  Will admit patient to the hospital team for further work-up of her anemia.     ____________________________________________   FINAL CLINICAL IMPRESSION(S) / ED DIAGNOSES   Final diagnoses:  Symptomatic anemia  Iron deficiency anemia, unspecified iron deficiency anemia type      MEDICATIONS GIVEN DURING THIS VISIT:  Medications  0.9 %  sodium chloride infusion (10 mL/hr Intravenous New Bag/Given 06/21/19 1929)     ED Discharge Orders    None       Note:  This document was prepared using Dragon voice recognition software and may include unintentional dictation errors.   Vanessa [place], MD 06/21/19 2022

## 2019-06-21 NOTE — ED Notes (Signed)
Urine sample sent to lab

## 2019-06-21 NOTE — ED Notes (Signed)
Pt reports feeling excessively tired and dizzy for months. Denies seeing blood in stool or elsewhere. Edema to lower legs bilaterally. Pt denies CP but does state recent SOB.

## 2019-06-21 NOTE — H&P (Signed)
[facility] NAME: Margaret [name]    MR#:  [id number]  DATE OF BIRTH:  02-Sep-1956  DATE OF ADMISSION:  06/21/2019  PRIMARY CARE PHYSICIAN: Rusty Aus, MD   REQUESTING/REFERRING PHYSICIAN: Jari Pigg, MD  CHIEF COMPLAINT:   Chief Complaint  Patient presents with  . Abnormal Lab    HISTORY OF PRESENT ILLNESS:  Margaret [name]  is a 63 y.o. female who presents with chief complaint as above.  Patient presents the ED after being notified by her physician that she had a hemoglobin of 5.  She does state that she has been feeling fatigued lately, and taking naps.  She just thought this is due to getting older.  She also has had some shortness of breath, though she felt that this might of been due to some swelling in her abdomen.  She denies any overt bleeding events, but does state that she was taking Pepto-Bismol for GI discomfort pretty regularly lately.  She had some dark stools and thought that this was from the [place].  She was ordered 2 units PRBC transfusion in the ED and hospitalist were called for admission  PAST MEDICAL HISTORY:   Past Medical History:  Diagnosis Date  . Bipolar 1 disorder ([place])   . Diabetes mellitus without complication (HCC)    Type 2  . Hypertension      PAST SURGICAL HISTORY:   Past Surgical History:  Procedure Laterality Date  . ABDOMINAL HYSTERECTOMY    . CHOLECYSTECTOMY    . TONSILLECTOMY       SOCIAL HISTORY:   Social History   Tobacco Use  . Smoking status: Former Smoker    Types: Cigarettes    Quit date: 03/03/2011    Years since quitting: 8.3  . Smokeless tobacco: Never Used  Substance Use Topics  . Alcohol use: Not Currently     FAMILY HISTORY:    Family history reviewed and is non-contributory DRUG ALLERGIES:   Allergies  Allergen Reactions  . Penicillins Hives    Resulted in hospitalization  . Sulfa Antibiotics Hives    Resulted in hospitalization    MEDICATIONS AT  HOME:   Prior to Admission medications   Medication Sig Start Date End Date Taking? Authorizing Provider  alendronate (FOSAMAX) 35 MG tablet Take 35 mg by mouth every 7 (seven) days. Take with a full glass of water on an empty stomach.   Yes [provider]  amLODipine (NORVASC) 10 MG tablet Take 10 mg by mouth daily.   Yes [provider]  ARIPiprazole (ABILIFY) 2 MG tablet Take 2 mg by mouth daily.   Yes [provider]  ARIPiprazole (ABILIFY) 5 MG tablet Take 5 mg by mouth daily.   Yes [provider]  citalopram (CELEXA) 40 MG tablet Take 40 mg by mouth daily.   Yes [provider]  Dulaglutide (TRULICITY) 0.17 PZ/0.2HE SOPN Inject 0.75 mg into the skin once a week. Thursday   Yes [provider]  gabapentin (NEURONTIN) 600 MG tablet Take 600 mg by mouth 3 (three) times daily.   Yes [provider]  hydrochlorothiazide (HYDRODIURIL) 25 MG tablet Take 25 mg by mouth daily. 06/21/19 06/20/20 Yes [provider]  HYDROcodone-acetaminophen (NORCO) 10-325 MG tablet Take 1 tablet by mouth 3 (three) times daily.   Yes [provider]  Insulin Glargine (LANTUS SOLOSTAR) 100 UNIT/ML Solostar Pen Inject 60 Units into the skin at bedtime.   Yes [provider]  lamoTRIgine (LAMICTAL) 200 MG tablet Take 200 mg by mouth daily.   Yes [provider]  metFORMIN (GLUCOPHAGE) 500 MG tablet Take 500 mg by mouth 2 (two) times daily with a meal.   Yes [provider]  pantoprazole (PROTONIX) 40 MG tablet Take 40 mg by mouth daily.   Yes [provider]  naproxen (NAPROSYN) 500 MG tablet Take 500 mg by mouth 2 (two) times daily with a meal.    [provider]    REVIEW OF SYSTEMS:  Review of Systems  Constitutional: Positive for malaise/fatigue. Negative for chills, fever and weight loss.  HENT: Negative for ear pain, hearing loss and tinnitus.   Eyes: Negative for blurred vision, double  vision, pain and redness.  Respiratory: Positive for shortness of breath. Negative for cough and hemoptysis.   Cardiovascular: Negative for chest pain, palpitations, orthopnea and leg swelling.  Gastrointestinal: Positive for abdominal pain. Negative for constipation, diarrhea, nausea and vomiting.  Genitourinary: Negative for dysuria, frequency and hematuria.  Musculoskeletal: Negative for back pain, joint pain and neck pain.  Skin:       No acne, rash, or lesions  Neurological: Negative for dizziness, tremors, focal weakness and weakness.  Endo/Heme/Allergies: Negative for polydipsia. Does not bruise/bleed easily.  Psychiatric/Behavioral: Negative for depression. The patient is not nervous/anxious and does not have insomnia.      VITAL SIGNS:   Vitals:   06/21/19 2208 06/21/19 2210 06/21/19 2217 06/21/19 2226  BP:  (!) 146/62    Pulse:  79 78 79  Resp:  (!) 22 18 17   Temp:      TempSrc:      SpO2: 98% 95% 96% 97%  Weight:      Height:       Wt Readings from Last 3 Encounters:  06/21/19 93.4 kg  12/03/18 82.1 kg    PHYSICAL EXAMINATION:  Physical Exam  Vitals reviewed. Constitutional: She is oriented to person, place, and time. She appears well-developed and well-nourished. No distress.  HENT:  Head: Normocephalic and atraumatic.  Mouth/Throat: Oropharynx is clear and moist.  Eyes: Pupils are equal, round, and reactive to light. EOM are normal. No scleral icterus.  Conjunctival pallor  Neck: Normal range of motion. Neck supple. No JVD present. No thyromegaly present.  Cardiovascular: Normal rate, regular rhythm and intact distal pulses. Exam reveals no gallop and no friction rub.  No murmur heard. Respiratory: Effort normal and breath sounds normal. No respiratory distress. She has no wheezes. She has no rales.  GI: Soft. Bowel sounds are normal. She exhibits no distension. There is no abdominal tenderness.  Musculoskeletal: Normal range of motion.        General: No  edema.     Comments: No arthritis, no gout  Lymphadenopathy:    She has no cervical adenopathy.  Neurological: She is alert and oriented to person, place, and time. No cranial nerve deficit.  No dysarthria, no aphasia  Skin: Skin is warm and dry. No rash noted. No erythema.  Psychiatric: She has a normal mood and affect. Her behavior is normal. Judgment and thought content normal.    LABORATORY PANEL:   CBC Recent Labs  Lab 06/21/19 1725  WBC 4.3  HGB 5.1*  HCT 18.8*  PLT 118*   ------------------------------------------------------------------------------------------------------------------  Chemistries  Recent Labs  Lab 06/21/19 1725  NA 136  K 3.7  CL 107  CO2 22  GLUCOSE 111*  BUN 10  CREATININE 0.65  CALCIUM 8.2*  AST  41  ALT 25  ALKPHOS 76  BILITOT 0.9   ------------------------------------------------------------------------------------------------------------------  Cardiac Enzymes No results for input(s): TROPONINI in the last 168 hours. ------------------------------------------------------------------------------------------------------------------  RADIOLOGY:  Ct Abdomen Pelvis W Contrast  Result Date: 06/21/2019 CLINICAL DATA:  Fall, possible hernia, abdominal swelling and weight gain EXAM: CT ABDOMEN AND PELVIS WITH CONTRAST TECHNIQUE: Multidetector CT imaging of the abdomen and pelvis was performed using the standard protocol following bolus administration of intravenous contrast. CONTRAST:  167mL OMNIPAQUE IOHEXOL 300 MG/ML SOLN, additional oral enteric contrast COMPARISON:  None. FINDINGS: Lower chest: No acute abnormality. Hepatobiliary: Coarse, nodular contour of the liver. Status post cholecystectomy. No biliary ductal dilatation. Pancreas: Unremarkable. No pancreatic ductal dilatation or surrounding inflammatory changes. Spleen: Splenomegaly, maximum coronal span 15.7 cm Adrenals/Urinary Tract: Adrenal glands are unremarkable. Kidneys are normal,  without renal calculi, solid lesion, or hydronephrosis. Bladder is unremarkable. Stomach/Bowel: Stomach is within normal limits. Appendix appears normal. No evidence of bowel wall thickening, distention, or inflammatory changes. Vascular/Lymphatic: Aortic atherosclerosis. No enlarged abdominal or pelvic lymph nodes. Reproductive: Status post hysterectomy. Other: There is a small, broad-based fat containing midline ventral hernia measuring 5.7 x 3.2 x 2.2 cm (series 2, image 53, series 6, image 70). Small volume ascites. Musculoskeletal: No acute or significant osseous findings. Status post left hip total arthroplasty. IMPRESSION: 1.  Stigmata of cirrhosis, splenomegaly, and small volume ascites. 2. There is a small, broad-based fat containing midline ventral hernia measuring 5.7 x 3.2 x 2.2 cm (series 2, image 53, series 6, image 70). 3. Other chronic, incidental, and postoperative findings as detailed above. Electronically Signed   By: Eddie Candle M.D.   On: 06/21/2019 16:36    EKG:  No orders found for this or any previous visit.  IMPRESSION AND PLAN:  Principal Problem:   Anemia -PRBC transfusion, recheck hemoglobin.  GI consult Active Problems:   HTN (hypertension) -continue home dose antihypertensives   Diabetes (HCC) -sliding scale insulin   CKD (chronic kidney disease), stage III (HCC) -at baseline, avoid nephrotoxins and monitor   Chart review performed and case discussed with ED provider. Labs, imaging and/or ECG reviewed by provider and discussed with patient/family. Management plans discussed with the patient and/or family.  COVID-19 status: Tested negative     DVT PROPHYLAXIS: Mechanical only  GI PROPHYLAXIS:  PPI   ADMISSION STATUS: Inpatient  CODE STATUS: Full  TOTAL TIME TAKING CARE OF THIS PATIENT: 45 minutes.   This patient was evaluated in the context of the global COVID-19 pandemic, which necessitated consideration that the patient might be at risk for infection  with the SARS-CoV-2 virus that causes COVID-19. Institutional protocols and algorithms that pertain to the evaluation of patients at risk for COVID-19 are in a state of rapid change based on information released by regulatory bodies including the CDC and federal and state organizations. These policies and algorithms were followed to the best of this provider's knowledge to date during the patient's care at this facility.  Ethlyn Daniels 06/21/2019, 10:28 PM  [facility]  Office  [phone]  CC: Primary care physician; Rusty Aus, MD  Note:  This document was prepared using Dragon voice recognition software and may include unintentional dictation errors.

## 2019-06-21 NOTE — ED Triage Notes (Addendum)
First RN Note: Pt presents to ED via wheelchair from CT with c/o abnormal labs, per CT tech pt referred by MD for low hgb. Pt with 20g to R AC started by CT, CT abd and pelvis performed prior to arrival to ED.   Per Care Everywhere results pt with hgb of 5.7.

## 2019-06-21 NOTE — ED Notes (Signed)
Pt tolerating blood well; denies any needs currently. Friend remains at bedside.

## 2019-06-21 NOTE — ED Notes (Signed)
ED TO INPATIENT HANDOFF REPORT  ED Nurse Name and Phone #: Metta Clines [phone]  S Name/Age/Gender Margaret [name] 63 y.o. female Room/Bed: ED16A/ED16A  Code Status   Code Status: Not on file  Home/SNF/Other Home Patient oriented to: self, place, time and situation Is this baseline? Yes   Triage Complete: Triage complete  Chief Complaint abnormal lab  Triage Note First RN Note: Pt presents to ED via wheelchair from CT with c/o abnormal labs, per CT tech pt referred by MD for low hgb. Pt with 20g to R AC started by CT, CT abd and pelvis performed prior to arrival to ED.   Per Care Everywhere results pt with hgb of 5.7.    Allergies Allergies  Allergen Reactions  . Penicillins Hives  . Sulfa Antibiotics Hives    Level of Care/Admitting Diagnosis ED Disposition    None      B Medical/Surgery History Past Medical History:  Diagnosis Date  . Bipolar 1 disorder ([place])   . Diabetes mellitus without complication (HCC)    Type 2  . Hypertension    Past Surgical History:  Procedure Laterality Date  . ABDOMINAL HYSTERECTOMY    . CHOLECYSTECTOMY    . TONSILLECTOMY       A IV Location/Drains/Wounds Patient Lines/Drains/Airways Status   Active Line/Drains/Airways    Name:   Placement date:   Placement time:   Site:   Days:   Peripheral IV 06/21/19 Right Antecubital   06/21/19    1621    Antecubital   less than 1          Intake/Output Last 24 hours No intake or output data in the 24 hours ending 06/21/19 2010  Labs/Imaging Results for orders placed or performed during the hospital encounter of 06/21/19 (from the past 48 hour(s))  Glucose, capillary     Status: Abnormal   Collection Time: 06/21/19  4:52 PM  Result Value Ref Range   Glucose-Capillary 124 (H) 70 - 99 mg/dL  CBC with Differential     Status: Abnormal   Collection Time: 06/21/19  5:25 PM  Result Value Ref Range   WBC 4.3 4.0 - 10.5 K/uL   RBC 2.73 (L) 3.87 - 5.11 MIL/uL   Hemoglobin 5.1 (L) 12.0  - 15.0 g/dL    Comment: Reticulocyte Hemoglobin testing may be clinically indicated, consider ordering this additional test [id number]    HCT 18.8 (L) 36.0 - 46.0 %   MCV 68.9 (L) 80.0 - 100.0 fL   MCH 18.7 (L) 26.0 - 34.0 pg   MCHC 27.1 (L) 30.0 - 36.0 g/dL   RDW 18.8 (H) 11.5 - 15.5 %   Platelets 118 (L) 150 - 400 K/uL    Comment: Immature Platelet Fraction may be clinically indicated, consider ordering this additional test [id number]    nRBC 0.5 (H) 0.0 - 0.2 %   Neutrophils Relative % 52 %   Neutro Abs 2.3 1.7 - 7.7 K/uL   Lymphocytes Relative 31 %   Lymphs Abs 1.3 0.7 - 4.0 K/uL   Monocytes Relative 13 %   Monocytes Absolute 0.6 0.1 - 1.0 K/uL   Eosinophils Relative 2 %   Eosinophils Absolute 0.1 0.0 - 0.5 K/uL   Basophils Relative 1 %   Basophils Absolute 0.0 0.0 - 0.1 K/uL   Immature Granulocytes 1 %   Abs Immature Granulocytes 0.02 0.00 - 0.07 K/uL    Comment: Performed at [facility], [address], [place], [place] [address]  Comprehensive metabolic  panel     Status: Abnormal   Collection Time: 06/21/19  5:25 PM  Result Value Ref Range   Sodium 136 135 - 145 mmol/L   Potassium 3.7 3.5 - 5.1 mmol/L   Chloride 107 98 - 111 mmol/L   CO2 22 22 - 32 mmol/L   Glucose, Bld 111 (H) 70 - 99 mg/dL   BUN 10 8 - 23 mg/dL   Creatinine, Ser 0.65 0.44 - 1.00 mg/dL   Calcium 8.2 (L) 8.9 - 10.3 mg/dL   Total Protein 5.5 (L) 6.5 - 8.1 g/dL   Albumin 2.7 (L) 3.5 - 5.0 g/dL   AST 41 15 - 41 U/L   ALT 25 0 - 44 U/L   Alkaline Phosphatase 76 38 - 126 U/L   Total Bilirubin 0.9 0.3 - 1.2 mg/dL   GFR calc non Af Amer >60 >60 mL/min   GFR calc Af Amer >60 >60 mL/min   Anion gap 7 5 - 15    Comment: Performed at [facility], [address] [place], [place] [address]  Protime-INR     Status: Abnormal   Collection Time: 06/21/19  5:25 PM  Result Value Ref Range   Prothrombin Time 15.5 (H) 11.4 - 15.2 seconds   INR 1.2 0.8 - 1.2    Comment: (NOTE) INR goal  varies based on device and disease states. Performed at [facility], [address] [place], [place] [address]   APTT     Status: None   Collection Time: 06/21/19  5:25 PM  Result Value Ref Range   aPTT 30 24 - 36 seconds    Comment: Performed at [facility], [address] [place], [place] [address]  Type and screen [place]     Status: None (Preliminary result)   Collection Time: 06/21/19  5:25 PM  Result Value Ref Range   ABO/RH(D) B POS    Antibody Screen NEG    Sample Expiration 06/24/2019,2359    Unit Number [id number]    Blood Component Type RBC, LR IRR    Unit division 00    Status of Unit ISSUED    Transfusion Status OK TO TRANSFUSE    Crossmatch Result      Compatible Performed at [facility], [address] [place], [place] [address]    Unit Number [id number]    Blood Component Type RED CELLS,LR    Unit division 00    Status of Unit ALLOCATED    Transfusion Status OK TO TRANSFUSE    Crossmatch Result Compatible    Unit Number [id number]    Blood Component Type RBC LR PHER1    Unit division 00    Status of Unit ALLOCATED    Transfusion Status OK TO TRANSFUSE    Crossmatch Result Compatible   Iron and TIBC     Status: Abnormal   Collection Time: 06/21/19  5:25 PM  Result Value Ref Range   Iron 13 (L) 28 - 170 ug/dL   TIBC 555 (H) 250 - 450 ug/dL   Saturation Ratios 2 (L) 10.4 - 31.8 %   UIBC 542 ug/dL    Comment: Performed at [facility], [address] [place], [place] [address]  Ferritin (Iron Binding Protein)     Status: Abnormal   Collection Time: 06/21/19  5:25 PM  Result Value Ref Range   Ferritin 4 (L) 11 - 307 ng/mL    Comment: Performed at [facility], [place]  [address], [place], [place] [address]  Lactate dehydrogenase     Status: Abnormal   Collection Time: 06/21/19  5:25 PM  Result Value Ref Range   LDH 197 (H) 98 - 192 U/L    Comment: Performed at  [facility], [address] [place], [place] [address]  SARS Coronavirus 2 [facility] order, Performed in [facility] hospital lab) Nasopharyngeal Nasopharyngeal Swab     Status: None   Collection Time: 06/21/19  5:25 PM   Specimen: Nasopharyngeal Swab  Result Value Ref Range   SARS Coronavirus 2 NEGATIVE NEGATIVE    Comment: (NOTE) If result is NEGATIVE SARS-CoV-2 target nucleic acids are NOT DETECTED. The SARS-CoV-2 RNA is generally detectable in upper and lower  respiratory specimens during the acute phase of infection. The lowest  concentration of SARS-CoV-2 viral copies this assay can detect is 250  copies / mL. A negative result does not preclude SARS-CoV-2 infection  and should not be used as the sole basis for treatment or other  patient management decisions.  A negative result may occur with  improper specimen collection / handling, submission of specimen other  than nasopharyngeal swab, presence of viral mutation(s) within the  areas targeted by this assay, and inadequate number of viral copies  (<250 copies / mL). A negative result must be combined with clinical  observations, patient history, and epidemiological information. If result is POSITIVE SARS-CoV-2 target nucleic acids are DETECTED. The SARS-CoV-2 RNA is generally detectable in upper and lower  respiratory specimens dur ing the acute phase of infection.  Positive  results are indicative of active infection with SARS-CoV-2.  Clinical  correlation with patient history and other diagnostic information is  necessary to determine patient infection status.  Positive results do  not rule out bacterial infection or co-infection with other viruses. If result is PRESUMPTIVE POSTIVE SARS-CoV-2 nucleic acids MAY BE PRESENT.   A presumptive positive result was obtained on the submitted specimen  and confirmed on repeat testing.  While 2019 novel coronavirus  (SARS-CoV-2) nucleic acids may be present in the  submitted sample  additional confirmatory testing may be necessary for epidemiological  and / or clinical management purposes  to differentiate between  SARS-CoV-2 and other Sarbecovirus currently known to infect humans.  If clinically indicated additional testing with an alternate test  methodology [phone]) is advised. The SARS-CoV-2 RNA is generally  detectable in upper and lower respiratory sp ecimens during the acute  phase of infection. The expected result is Negative. Fact Sheet for Patients:  StrictlyIdeas.no Fact Sheet for Healthcare Providers: BankingDealers.co.za This test is not yet approved or cleared by the [place] FDA and has been authorized for detection and/or diagnosis of SARS-CoV-2 by FDA under an Emergency Use Authorization (EUA).  This EUA will remain in effect (meaning this test can be used) for the duration of the COVID-19 declaration under Section 564(b)(1) of the Act, 21 U.S.C. section 360bbb-3(b)(1), unless the authorization is terminated or revoked sooner. Performed at [facility], [address] [place], [place] [address]   Glucose, capillary     Status: Abnormal   Collection Time: 06/21/19  6:14 PM  Result Value Ref Range   Glucose-Capillary 101 (H) 70 - 99 mg/dL  Prepare RBC     Status: None   Collection Time: 06/21/19  6:20 PM  Result Value Ref Range   Order Confirmation      ORDER PROCESSED BY BLOOD BANK Performed at [facility], [address] [place],  [place] [address]   ABO/Rh     Status: None   Collection Time: 06/21/19  6:30 PM  Result Value Ref Range   ABO/RH(D)      B POS Performed at [facility], [place], [place] [address]    Ct Abdomen Pelvis W Contrast  Result Date: 06/21/2019 CLINICAL DATA:  Fall, possible hernia, abdominal swelling and weight gain EXAM: CT ABDOMEN AND PELVIS WITH CONTRAST TECHNIQUE: Multidetector CT imaging of  the abdomen and pelvis was performed using the standard protocol following bolus administration of intravenous contrast. CONTRAST:  158mL OMNIPAQUE IOHEXOL 300 MG/ML SOLN, additional oral enteric contrast COMPARISON:  None. FINDINGS: Lower chest: No acute abnormality. Hepatobiliary: Coarse, nodular contour of the liver. Status post cholecystectomy. No biliary ductal dilatation. Pancreas: Unremarkable. No pancreatic ductal dilatation or surrounding inflammatory changes. Spleen: Splenomegaly, maximum coronal span 15.7 cm Adrenals/Urinary Tract: Adrenal glands are unremarkable. Kidneys are normal, without renal calculi, solid lesion, or hydronephrosis. Bladder is unremarkable. Stomach/Bowel: Stomach is within normal limits. Appendix appears normal. No evidence of bowel wall thickening, distention, or inflammatory changes. Vascular/Lymphatic: Aortic atherosclerosis. No enlarged abdominal or pelvic lymph nodes. Reproductive: Status post hysterectomy. Other: There is a small, broad-based fat containing midline ventral hernia measuring 5.7 x 3.2 x 2.2 cm (series 2, image 53, series 6, image 70). Small volume ascites. Musculoskeletal: No acute or significant osseous findings. Status post left hip total arthroplasty. IMPRESSION: 1.  Stigmata of cirrhosis, splenomegaly, and small volume ascites. 2. There is a small, broad-based fat containing midline ventral hernia measuring 5.7 x 3.2 x 2.2 cm (series 2, image 53, series 6, image 70). 3. Other chronic, incidental, and postoperative findings as detailed above. Electronically Signed   By: Eddie Candle M.D.   On: 06/21/2019 16:36    Pending Labs Unresulted Labs (From admission, onward)    Start     Ordered   06/21/19 1703  Haptoglobin  Once,   STAT     06/21/19 1702          Vitals/Pain Today's Vitals   06/21/19 1930 06/21/19 1942 06/21/19 1952 06/21/19 2000  BP: (!) 115/34 (!) 126/99 (!) 128/52 (!) 114/48  Pulse: 80 86 82 83  Resp: 19 19 16 16   Temp:   98.6  F (37 C)   TempSrc:   Oral   SpO2: 98% 98% 96% 96%  Weight:      Height:      PainSc:        Isolation Precautions No active isolations  Medications Medications  0.9 %  sodium chloride infusion (10 mL/hr Intravenous New Bag/Given 06/21/19 1929)    Mobility walks Low fall risk   Focused Assessments Pt here from doc visit d/t low hbg; confirmed low here at 5.1. Pt states has been dizzy/fatigued for months (with SOB starting yesterday) but was unsure why; denies seeing any obvious blood in stool or elsewhere.    R Recommendations: See Admitting Provider Note  Report given to:   Additional Notes:  History of iron deficiency

## 2019-06-21 NOTE — ED Notes (Signed)
Margaret [name], NT to bedside.

## 2019-06-22 DIAGNOSIS — D509 Iron deficiency anemia, unspecified: Principal | ICD-10-CM

## 2019-06-22 LAB — POCT CBG MONITORING: CBG: 105

## 2019-06-22 LAB — CBC
HCT: 26.2 % — ABNORMAL LOW (ref 36.0–46.0)
Hemoglobin: 7.7 g/dL — ABNORMAL LOW (ref 12.0–15.0)
MCH: 21.3 pg — ABNORMAL LOW (ref 26.0–34.0)
MCHC: 29.4 g/dL — ABNORMAL LOW (ref 30.0–36.0)
MCV: 72.6 fL — ABNORMAL LOW (ref 80.0–100.0)
Platelets: 119 10*3/uL — ABNORMAL LOW (ref 150–400)
RBC: 3.61 MIL/uL — ABNORMAL LOW (ref 3.87–5.11)
RDW: 19.3 % — ABNORMAL HIGH (ref 11.5–15.5)
WBC: 5.1 10*3/uL (ref 4.0–10.5)
nRBC: 0.4 % — ABNORMAL HIGH (ref 0.0–0.2)

## 2019-06-22 LAB — BASIC METABOLIC PANEL
Anion gap: 8 (ref 5–15)
BUN: 8 mg/dL (ref 8–23)
CO2: 22 mmol/L (ref 22–32)
Calcium: 8.4 mg/dL — ABNORMAL LOW (ref 8.9–10.3)
Chloride: 109 mmol/L (ref 98–111)
Creatinine, Ser: 0.69 mg/dL (ref 0.44–1.00)
GFR calc Af Amer: >60 mL/min (ref >60)
GFR calc non Af Amer: >60 mL/min (ref >60)
Glucose, Bld: 100 mg/dL — ABNORMAL HIGH (ref 70–99)
Potassium: 3.7 mmol/L (ref 3.5–5.1)
Sodium: 139 mmol/L (ref 135–145)

## 2019-06-22 LAB — IRON AND TIBC
Iron: 22 ug/dL — ABNORMAL LOW (ref 28–170)
Saturation Ratios: 4 % — ABNORMAL LOW (ref 10.4–31.8)
TIBC: 561 ug/dL — ABNORMAL HIGH (ref 250–450)
UIBC: 539 ug/dL

## 2019-06-22 LAB — VITAMIN B12: Vitamin B-12: 406 pg/mL (ref 180–914)

## 2019-06-22 LAB — FERRITIN: Ferritin: 6 ng/mL — ABNORMAL LOW (ref 11–307)

## 2019-06-22 LAB — FOLATE: Folate: 18.4 ng/mL (ref >5.9)

## 2019-06-22 LAB — HEMOGLOBIN A1C
Hgb A1c MFr Bld: 7.3 % — ABNORMAL HIGH (ref 4.8–5.6)
Mean Plasma Glucose: 162.81 mg/dL

## 2019-06-22 LAB — GLUCOSE, CAPILLARY
Glucose-Capillary: 98 mg/dL (ref 70–99)
Glucose-Capillary: 105 mg/dL — ABNORMAL HIGH (ref 70–99)

## 2019-06-22 MED ORDER — SALINE SPRAY 0.65 % NA SOLN
1.0000 | NASAL | Status: DC | PRN
  Administered 2019-06-22 – 2019-06-23 (×3): 1 via NASAL
  Filled 2019-06-22: qty 44

## 2019-06-22 MED ORDER — SODIUM CHLORIDE 0.9 % IV SOLN
INTRAVENOUS | Status: DC
  Administered 2019-06-22: 13:00:00 via INTRAVENOUS

## 2019-06-22 MED ORDER — PEG 3350-KCL-NA BICARB-NACL 420 G PO SOLR
4000.0000 mL | Freq: Once | ORAL | Status: AC
  Administered 2019-06-22: 18:00:00 4000 mL via ORAL
  Filled 2019-06-22: qty 4000

## 2019-06-22 MED ORDER — SODIUM CHLORIDE 0.9 % IV SOLN
200.0000 mg | INTRAVENOUS | Status: AC
  Administered 2019-06-22 – 2019-06-24 (×3): 200 mg via INTRAVENOUS
  Filled 2019-06-22 (×3): qty 10

## 2019-06-22 MED ORDER — LORATADINE 10 MG PO TABS
10.0000 mg | ORAL_TABLET | Freq: Every day | ORAL | Status: DC
  Administered 2019-06-22 – 2019-06-24 (×3): 10 mg via ORAL
  Filled 2019-06-22 (×3): qty 1

## 2019-06-22 MED ORDER — HYDROCODONE-ACETAMINOPHEN 5-325 MG PO TABS
1.0000 | ORAL_TABLET | Freq: Four times a day (QID) | ORAL | Status: DC | PRN
  Administered 2019-06-22 – 2019-06-23 (×3): 1 via ORAL
  Filled 2019-06-22 (×3): qty 1

## 2019-06-22 MED ORDER — FLUTICASONE PROPIONATE 50 MCG/ACT NA SUSP
2.0000 | Freq: Every day | NASAL | Status: DC
  Administered 2019-06-22 – 2019-06-23 (×2): 2 via NASAL
  Filled 2019-06-22: qty 16

## 2019-06-22 MED ORDER — DICLOFENAC SODIUM 1 % TD GEL
2.0000 g | Freq: Three times a day (TID) | TRANSDERMAL | Status: DC
  Administered 2019-06-22 – 2019-06-24 (×7): 2 g via TOPICAL
  Filled 2019-06-22: qty 100

## 2019-06-22 MED ORDER — SODIUM CHLORIDE 0.9 % IV SOLN
INTRAVENOUS | Status: DC | PRN
  Administered 2019-06-22: 30 mL via INTRAVENOUS
  Administered 2019-06-23: 12:00:00 via INTRAVENOUS

## 2019-06-22 NOTE — Progress Notes (Signed)
[facility] NAME: Margaret [name]    MR#:  [id number]  DATE OF BIRTH:  [birth year]  SUBJECTIVE:    REVIEW OF SYSTEMS:   ROS Tolerating Diet: Tolerating PT:   DRUG ALLERGIES:   Allergies  Allergen Reactions  . Penicillins Hives    Resulted in hospitalization  . Sulfa Antibiotics Hives    Resulted in hospitalization    VITALS:  Blood pressure (!) 131/54, pulse 79, temperature 98.5 F (36.9 C), temperature source Oral, resp. rate 19, height 5' 1.5" (1.562 m), weight 96.4 kg, SpO2 95 %.  PHYSICAL EXAMINATION:   Physical Exam  GENERAL:  63 y.o.-year-old patient lying in the bed with no acute distress.  EYES: Pupils equal, round, reactive to light and accommodation. No scleral icterus. Extraocular muscles intact.  HEENT: Head atraumatic, normocephalic. Oropharynx and nasopharynx clear.  NECK:  Supple, no jugular venous distention. No thyroid enlargement, no tenderness.  LUNGS: Normal breath sounds bilaterally, no wheezing, rales, rhonchi. No use of accessory muscles of respiration.  CARDIOVASCULAR: S1, S2 normal. No murmurs, rubs, or gallops.  ABDOMEN: Soft, nontender, nondistended. Bowel sounds present. No organomegaly or mass.  EXTREMITIES: No cyanosis, clubbing or edema b/l.    NEUROLOGIC: Cranial nerves II through XII are intact. No focal Motor or sensory deficits b/l.   PSYCHIATRIC:  patient is alert and oriented x 3.  SKIN: No obvious rash, lesion, or ulcer.   LABORATORY PANEL:  CBC Recent Labs  Lab 06/22/19 0502  WBC 5.1  HGB 7.7*  HCT 26.2*  PLT 119*    Chemistries  Recent Labs  Lab 06/21/19 1725 06/22/19 0502  NA 136 139  K 3.7 3.7  CL 107 109  CO2 22 22  GLUCOSE 111* 100*  BUN 10 8  CREATININE 0.65 0.69  CALCIUM 8.2* 8.4*  AST 41  --   ALT 25  --   ALKPHOS 76  --   BILITOT 0.9  --    Cardiac Enzymes No results for input(s): TROPONINI in the last 168 hours. RADIOLOGY:  Ct Abdomen  Pelvis W Contrast  Result Date: 06/21/2019 CLINICAL DATA:  Fall, possible hernia, abdominal swelling and weight gain EXAM: CT ABDOMEN AND PELVIS WITH CONTRAST TECHNIQUE: Multidetector CT imaging of the abdomen and pelvis was performed using the standard protocol following bolus administration of intravenous contrast. CONTRAST:  124mL OMNIPAQUE IOHEXOL 300 MG/ML SOLN, additional oral enteric contrast COMPARISON:  None. FINDINGS: Lower chest: No acute abnormality. Hepatobiliary: Coarse, nodular contour of the liver. Status post cholecystectomy. No biliary ductal dilatation. Pancreas: Unremarkable. No pancreatic ductal dilatation or surrounding inflammatory changes. Spleen: Splenomegaly, maximum coronal span 15.7 cm Adrenals/Urinary Tract: Adrenal glands are unremarkable. Kidneys are normal, without renal calculi, solid lesion, or hydronephrosis. Bladder is unremarkable. Stomach/Bowel: Stomach is within normal limits. Appendix appears normal. No evidence of bowel wall thickening, distention, or inflammatory changes. Vascular/Lymphatic: Aortic atherosclerosis. No enlarged abdominal or pelvic lymph nodes. Reproductive: Status post hysterectomy. Other: There is a small, broad-based fat containing midline ventral hernia measuring 5.7 x 3.2 x 2.2 cm (series 2, image 53, series 6, image 70). Small volume ascites. Musculoskeletal: No acute or significant osseous findings. Status post left hip total arthroplasty. IMPRESSION: 1.  Stigmata of cirrhosis, splenomegaly, and small volume ascites. 2. There is a small, broad-based fat containing midline ventral hernia measuring 5.7 x 3.2 x 2.2 cm (series 2, image 53, series 6, image 70). 3. Other chronic, incidental, and postoperative findings as detailed above.  Electronically Signed   By: Eddie Candle M.D.   On: 06/21/2019 16:36   ASSESSMENT AND PLAN:  Margaret [name] is a 63 y.o. female with diabetes, hypertension who presents for abnormal lab.  Patient presents for a hemoglobin  of 5.7.  Patient is had progressive abdominal pain and distention after having a fall at the beginning of the month.  Due to the pain her physician ordered a CT scan to make sure there is no evidence of bleeding.  CT abdomen was ordered which showed cirrhosis, splenomegaly, small volume ascites.  1. Iron def Anemia  -came in with Hgb of 5.7--s/p 2 PRBC transfusion--7.7 - GI consult Dr Vicente Males -patient has not had luminal evaluation in the past. 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 Cirrhosis of the liver with splenomegaly noted on CT scan of the abdomen -patient denies any alcohol abuse -patient is aware about history of cirrhosis -will check hepatitis panel -? hepatic steatosis  3.  HTN (hypertension) -continue home dose antihypertensives  4.  Diabetes ([place]) -sliding scale insulin -pt is on Lantus and metformin at home. Will resume after G.I. eval -A1c pending  5. CKD (chronic kidney disease), stage III ([place]) -at baseline, avoid nephrotoxins and monitor   Case discussed with Care Management/Social Worker. Management plans discussed with the patient and they are in agreement.  CODE STATUS: full DVT Prophylaxis: SCD  TOTAL TIME TAKING CARE OF THIS PATIENT: **30* minutes.  >50% time spent on counselling and coordination of care  POSSIBLE D/C IN **1 to 2* DAYS, DEPENDING ON CLINICAL CONDITION.  Note: This dictation was prepared with Dragon dictation along with smaller phrase technology. Any transcriptional errors that result from this process are unintentional.  Fritzi Mandes M.D on 06/22/2019 at 8:55 AM  Between 7am to 6pm - Pager - [phone]  After 6pm go to [email or web address] - password [email or web address] [facility]  Office  [phone]  CC: Primary  care physician; Rusty Aus, MDPatient ID: Margaret [name], female   DOB: 1956/05/10, 63 y.o.   MRN: [id number]

## 2019-06-22 NOTE — Consult Note (Signed)
Jonathon Bellows , MD [address], [place], [place], [place], [address] [address], [place], [place], [place], [address] Phone: [phone]  Fax: [phone]  Consultation  Referring Provider:     No ref. provider found Primary Care Physician:  Rusty Aus, MD Primary Gastroenterologist:  [facility]          Reason for Consultation:     Anemia   Date of Admission:  06/21/2019 Date of Consultation:  06/22/2019         HPI:   Margaret [name] is a 63 y.o. female admitted to the hospital after found to have a hemoglobin of 5.7 g when checked by her physician.  He had a hemoglobin checked due to feeling fatigued.  MCV was 69.4.  I do not have any old labs to compare with. Iron studies show an iron of 13, ferritin of 4.  No B12 or folate levels seen.  \Denies any rectal bleeding, hematemesis, blood in urione, nasal bleeds or vaginal bleeding. Never had a colonoscopy, mother had colon polyps, no weight loss or abdominal pain , feeling short of breath previously. Has been taking 1-2 naprosyn for hip pain a day a few times a week for 18 months.      Past Medical History:  Diagnosis Date  . Bipolar 1 disorder ([place])   . Diabetes mellitus without complication (HCC)    Type 2  . Hypertension     Past Surgical History:  Procedure Laterality Date  . ABDOMINAL HYSTERECTOMY    . CHOLECYSTECTOMY    . TONSILLECTOMY      Prior to Admission medications   Medication Sig Start Date End Date Taking? Authorizing Provider  alendronate (FOSAMAX) 35 MG tablet Take 35 mg by mouth every 7 (seven) days. Take with a full glass of water on an empty stomach.   Yes [provider]  amLODipine (NORVASC) 10 MG tablet Take 10 mg by mouth daily.   Yes [provider]  ARIPiprazole (ABILIFY) 2 MG tablet Take 2 mg by mouth daily.   Yes [provider]  ARIPiprazole (ABILIFY) 5 MG tablet Take 5 mg by mouth daily.   Yes [provider]  citalopram (CELEXA) 40 MG tablet Take  40 mg by mouth daily.   Yes [provider]  Dulaglutide (TRULICITY) 5.36 UY/4.0HK SOPN Inject 0.75 mg into the skin once a week. Thursday   Yes [provider]  gabapentin (NEURONTIN) 600 MG tablet Take 600 mg by mouth 3 (three) times daily.   Yes [provider]  hydrochlorothiazide (HYDRODIURIL) 25 MG tablet Take 25 mg by mouth daily. 06/21/19 06/20/20 Yes [provider]  HYDROcodone-acetaminophen (NORCO) 10-325 MG tablet Take 1 tablet by mouth 3 (three) times daily.   Yes [provider]  Insulin Glargine (LANTUS SOLOSTAR) 100 UNIT/ML Solostar Pen Inject 60 Units into the skin at bedtime.   Yes [provider]  lamoTRIgine (LAMICTAL) 200 MG tablet Take 200 mg by mouth daily.   Yes [provider]  metFORMIN (GLUCOPHAGE) 500 MG tablet Take 500 mg by mouth 2 (two) times daily with a meal.   Yes [provider]  pantoprazole (PROTONIX) 40 MG tablet Take 40 mg by mouth daily.   Yes [provider]  naproxen (NAPROSYN) 500 MG tablet Take 500 mg by mouth 2 (two) times daily with a meal.    [provider]    History reviewed. No pertinent family history.   Social History   Tobacco  Use  . Smoking status: Former Smoker    Types: Cigarettes    Quit date: 03/03/2011    Years since quitting: 8.3  . Smokeless tobacco: Never Used  Substance Use Topics  . Alcohol use: Not Currently  . Drug use: Not Currently    Allergies as of 06/21/2019 - Review Complete 06/21/2019  Allergen Reaction Noted  . Penicillins Hives 12/03/2018  . Sulfa antibiotics Hives 12/03/2018    Review of Systems:    All systems reviewed and negative except where noted in HPI.   Physical Exam:  Vital signs in last 24 hours: Temp:  [98.3 F (36.8 C)-99.2 F (37.3 C)] 98.5 F (36.9 C) (08/20 0333) Pulse Rate:  [75-92] 79 (08/20 0333) Resp:  [14-22] 19 (08/20 0333) BP: (110-149)/(34-99) 131/54 (08/20 0333) SpO2:  [95 %-100 %] 95 %  (08/20 0333) Weight:  [93.4 kg-96.4 kg] 96.4 kg (08/19 2322) Last BM Date: 06/22/19 General:   Pleasant, cooperative in NAD Head:  Normocephalic and atraumatic. Eyes:   No icterus.   Conjunctiva pink. PERRLA. Ears:  Normal auditory acuity. Neck:  Supple; no masses or thyroidomegaly Lungs: Respirations even and unlabored. Lungs clear to auscultation bilaterally.   No wheezes, crackles, or rhonchi.  Heart:  Regular rate and rhythm;  Without murmur, clicks, rubs or gallops Abdomen:  Soft, nondistended, nontender. Normal bowel sounds. No appreciable masses or hepatomegaly.  No rebound or guarding.  Neurologic:  Alert and oriented x3;  grossly normal neurologically. Skin:  Intact without significant lesions or rashes. Cervical Nodes:  No significant cervical adenopathy. Psych:  Alert and cooperative. Normal affect.  LAB RESULTS: Recent Labs    06/21/19 1725 06/22/19 0502  WBC 4.3 5.1  HGB 5.1* 7.7*  HCT 18.8* 26.2*  PLT 118* 119*   BMET Recent Labs    06/21/19 1622 06/21/19 1725 06/22/19 0502  NA  --  136 139  K  --  3.7 3.7  CL  --  107 109  CO2  --  22 22  GLUCOSE  --  111* 100*  BUN  --  10 8  CREATININE 0.70 0.65 0.69  CALCIUM  --  8.2* 8.4*   LFT Recent Labs    06/21/19 1725  PROT 5.5*  ALBUMIN 2.7*  AST 41  ALT 25  ALKPHOS 76  BILITOT 0.9   PT/INR Recent Labs    06/21/19 1725  LABPROT 15.5*  INR 1.2    STUDIES: Ct Abdomen Pelvis W Contrast  Result Date: 06/21/2019 CLINICAL DATA:  Fall, possible hernia, abdominal swelling and weight gain EXAM: CT ABDOMEN AND PELVIS WITH CONTRAST TECHNIQUE: Multidetector CT imaging of the abdomen and pelvis was performed using the standard protocol following bolus administration of intravenous contrast. CONTRAST:  169mL OMNIPAQUE IOHEXOL 300 MG/ML SOLN, additional oral enteric contrast COMPARISON:  None. FINDINGS: Lower chest: No acute abnormality. Hepatobiliary: Coarse, nodular contour of the liver. Status post  cholecystectomy. No biliary ductal dilatation. Pancreas: Unremarkable. No pancreatic ductal dilatation or surrounding inflammatory changes. Spleen: Splenomegaly, maximum coronal span 15.7 cm Adrenals/Urinary Tract: Adrenal glands are unremarkable. Kidneys are normal, without renal calculi, solid lesion, or hydronephrosis. Bladder is unremarkable. Stomach/Bowel: Stomach is within normal limits. Appendix appears normal. No evidence of bowel wall thickening, distention, or inflammatory changes. Vascular/Lymphatic: Aortic atherosclerosis. No enlarged abdominal or pelvic lymph nodes. Reproductive: Status post hysterectomy. Other: There is a small, broad-based fat containing midline ventral hernia measuring 5.7 x 3.2 x 2.2 cm (series 2, image 53, series 6, image 70). Small volume  ascites. Musculoskeletal: No acute or significant osseous findings. Status post left hip total arthroplasty. IMPRESSION: 1.  Stigmata of cirrhosis, splenomegaly, and small volume ascites. 2. There is a small, broad-based fat containing midline ventral hernia measuring 5.7 x 3.2 x 2.2 cm (series 2, image 53, series 6, image 70). 3. Other chronic, incidental, and postoperative findings as detailed above. Electronically Signed   By: Eddie Candle M.D.   On: 06/21/2019 16:36      Impression / Plan:   Margaret [name] is a 63 y.o. y/o female admitted to the hospital with an incidental finding of a hemoglobin of 5.7 g when checked by her PCP for evaluation of fatigue.  Labs suggest a microcytic severe iron deficiency anemia.Likely secondary to long term nsaid use . No overt blood loss.    Plan 1.  EGD plus colonoscopy and if both are negative capsule study of the small bowel. 2.  Check follow-up B12 and folate levels, celiac serology, urine analysis.  Suggest IV iron inpatient and frequently at follow-up as an outpatient.  I have discussed alternative options, risks & benefits,  which include, but are not limited to, bleeding, infection,  perforation,respiratory complication & drug reaction.  The patient agrees with this plan & written consent will be obtained.     Thank you for involving me in the care of this patient.      LOS: 1 day   Jonathon Bellows, MD  06/22/2019, 8:17 AM

## 2019-06-22 NOTE — Progress Notes (Signed)
   06/22/19 1000  Clinical Encounter Type  Visited With Patient and family together  Visit Type Initial  Ch was rounding. Pt was sitting upright and feeling good. Pt lives with her daughter and son-in-law and three children. They've been moving around a lot in the past two years but have been adjusting well. Pt hasn't been able to provide direct care to the grandchildren because of her health issues but look forward to be more actively engaged in child care soon. Pt's friend visited so ch left to give space. Ch will continue to follow up.

## 2019-06-22 NOTE — Progress Notes (Signed)
Patient refuses bed alarms at this time, patient educated about safety.

## 2019-06-23 ENCOUNTER — Encounter
Admission: EM | Disposition: A | Discharge status: Home / Self Care | Payer: Self-pay | Source: Home / Self Care | Attending: Internal Medicine

## 2019-06-23 ENCOUNTER — Inpatient Hospital Stay: Payer: Medicaid Other | Admitting: Anesthesiology

## 2019-06-23 DIAGNOSIS — D125 Benign neoplasm of sigmoid colon: Secondary | ICD-10-CM

## 2019-06-23 HISTORY — PX: ESOPHAGOGASTRODUODENOSCOPY (EGD) WITH PROPOFOL: SHX5813

## 2019-06-23 HISTORY — PX: COLONOSCOPY WITH PROPOFOL: SHX5780

## 2019-06-23 LAB — CBC
HCT: 26 % — ABNORMAL LOW (ref 36.0–46.0)
Hemoglobin: 7.6 g/dL — ABNORMAL LOW (ref 12.0–15.0)
MCH: 21.1 pg — ABNORMAL LOW (ref 26.0–34.0)
MCHC: 29.2 g/dL — ABNORMAL LOW (ref 30.0–36.0)
MCV: 72.2 fL — ABNORMAL LOW (ref 80.0–100.0)
Platelets: 109 10*3/uL — ABNORMAL LOW (ref 150–400)
RBC: 3.6 MIL/uL — ABNORMAL LOW (ref 3.87–5.11)
RDW: 19.9 % — ABNORMAL HIGH (ref 11.5–15.5)
WBC: 5.8 10*3/uL (ref 4.0–10.5)
nRBC: 0 % (ref 0.0–0.2)

## 2019-06-23 LAB — GLUCOSE, CAPILLARY
Glucose-Capillary: 123 mg/dL — ABNORMAL HIGH (ref 70–99)
Glucose-Capillary: 155 mg/dL — ABNORMAL HIGH (ref 70–99)
Glucose-Capillary: 144 mg/dL — ABNORMAL HIGH (ref 70–99)
Glucose-Capillary: 157 mg/dL — ABNORMAL HIGH (ref 70–99)
Glucose-Capillary: 153 mg/dL — ABNORMAL HIGH (ref 70–99)

## 2019-06-23 LAB — HAPTOGLOBIN: Haptoglobin: 95 mg/dL (ref 37–355)

## 2019-06-23 LAB — HIV ANTIBODY (ROUTINE TESTING W REFLEX): HIV Screen 4th Generation wRfx: NONREACTIVE

## 2019-06-23 SURGERY — ESOPHAGOGASTRODUODENOSCOPY (EGD) WITH PROPOFOL
Anesthesia: General

## 2019-06-23 MED ORDER — MORPHINE SULFATE (PF) 2 MG/ML IV SOLN
2.0000 mg | INTRAVENOUS | Status: DC | PRN
  Administered 2019-06-23: 06:00:00 2 mg via INTRAVENOUS
  Filled 2019-06-23: qty 1

## 2019-06-23 MED ORDER — GABAPENTIN 600 MG PO TABS
600.0000 mg | ORAL_TABLET | Freq: Three times a day (TID) | ORAL | Status: DC
  Administered 2019-06-23 – 2019-06-24 (×3): 600 mg via ORAL
  Filled 2019-06-23 (×4): qty 1

## 2019-06-23 MED ORDER — NYSTATIN-TRIAMCINOLONE 100000-0.1 UNIT/GM-% EX CREA
TOPICAL_CREAM | Freq: Two times a day (BID) | CUTANEOUS | Status: DC
  Administered 2019-06-23 – 2019-06-24 (×2): via TOPICAL
  Filled 2019-06-23: qty 15

## 2019-06-23 MED ORDER — PROPOFOL 500 MG/50ML IV EMUL
INTRAVENOUS | Status: DC | PRN
  Administered 2019-06-23: 150 ug/kg/min via INTRAVENOUS

## 2019-06-23 MED ORDER — PROPOFOL 10 MG/ML IV BOLUS
INTRAVENOUS | Status: DC | PRN
  Administered 2019-06-23: 20 mg via INTRAVENOUS
  Administered 2019-06-23: 50 mg via INTRAVENOUS

## 2019-06-23 MED ORDER — B COMPLEX-C PO TABS
1.0000 | ORAL_TABLET | Freq: Every day | ORAL | Status: DC
  Administered 2019-06-23 – 2019-06-24 (×2): 1 via ORAL
  Filled 2019-06-23 (×2): qty 1

## 2019-06-23 MED ORDER — LIDOCAINE HCL (PF) 2 % IJ SOLN
INTRAMUSCULAR | Status: DC | PRN
  Administered 2019-06-23: 100 mg via INTRADERMAL

## 2019-06-23 NOTE — H&P (Signed)
Jonathon Bellows, MD [address], [place], [place], [place], [address] [address], [place], [place], [place], [address] Phone: [phone]  Fax: [phone]  Primary Care Physician:  Rusty Aus, MD   Pre-Procedure History & Physical: HPI:  Margaret [name] is a 63 y.o. female is here for an endoscopy and colonoscopy    Past Medical History:  Diagnosis Date  . Bipolar 1 disorder ([place])   . Diabetes mellitus without complication (HCC)    Type 2  . Hypertension     Past Surgical History:  Procedure Laterality Date  . ABDOMINAL HYSTERECTOMY    . CHOLECYSTECTOMY    . TONSILLECTOMY      Prior to Admission medications   Medication Sig Start Date End Date Taking? Authorizing Provider  alendronate (FOSAMAX) 35 MG tablet Take 35 mg by mouth every 7 (seven) days. Take with a full glass of water on an empty stomach.   Yes [provider]  amLODipine (NORVASC) 10 MG tablet Take 10 mg by mouth daily.   Yes [provider]  ARIPiprazole (ABILIFY) 2 MG tablet Take 2 mg by mouth daily.   Yes [provider]  ARIPiprazole (ABILIFY) 5 MG tablet Take 5 mg by mouth daily.   Yes [provider]  citalopram (CELEXA) 40 MG tablet Take 40 mg by mouth daily.   Yes [provider]  Dulaglutide (TRULICITY) A999333 0000000 SOPN Inject 0.75 mg into the skin once a week. Thursday   Yes [provider]  gabapentin (NEURONTIN) 600 MG tablet Take 600 mg by mouth 3 (three) times daily.   Yes [provider]  hydrochlorothiazide (HYDRODIURIL) 25 MG tablet Take 25 mg by mouth daily. 06/21/19 06/20/20 Yes [provider]  HYDROcodone-acetaminophen (NORCO) 10-325 MG tablet Take 1 tablet by mouth 3 (three) times daily.   Yes [provider]  Insulin Glargine (LANTUS SOLOSTAR) 100 UNIT/ML Solostar Pen Inject 60 Units into the skin at bedtime.   Yes [provider]  lamoTRIgine (LAMICTAL) 200 MG tablet Take 200 mg by mouth  daily.   Yes [provider]  metFORMIN (GLUCOPHAGE) 500 MG tablet Take 500 mg by mouth 2 (two) times daily with a meal.   Yes [provider]  pantoprazole (PROTONIX) 40 MG tablet Take 40 mg by mouth daily.   Yes [provider]  naproxen (NAPROSYN) 500 MG tablet Take 500 mg by mouth 2 (two) times daily with a meal.    [provider]    Allergies as of 06/21/2019 - Review Complete 06/21/2019  Allergen Reaction Noted  . Penicillins Hives 12/03/2018  . Sulfa antibiotics Hives 12/03/2018    History reviewed. No pertinent family history.  Social History   Socioeconomic History  . Marital status: Married    Spouse name: Not on file  . Number of children: Not on file  . Years of education: Not on file  . Highest education level: Not on file  Occupational History  . Not on file  Social Needs  . Financial resource strain: Not on file  . Food insecurity    Worry: Not on file    Inability: Not on file  . Transportation needs    Medical: Not on file    Non-medical: Not on file  Tobacco Use  . Smoking status: Former Smoker    Types: Cigarettes    Quit date: 03/03/2011    Years since quitting: 8.3  . Smokeless tobacco: Never Used  Substance and Sexual  Activity  . Alcohol use: Not Currently  . Drug use: Not Currently  . Sexual activity: Not on file  Lifestyle  . Physical activity    Days per week: Not on file    Minutes per session: Not on file  . Stress: Not on file  Relationships  . Social Herbalist on phone: Not on file    Gets together: Not on file    Attends religious service: Not on file    Active member of club or organization: Not on file    Attends meetings of clubs or organizations: Not on file    Relationship status: Not on file  . Intimate partner violence    Fear of current or ex partner: Not on file    Emotionally abused: Not on file    Physically abused: Not on file    Forced sexual activity: Not on file   Other Topics Concern  . Not on file  Social History Narrative  . Not on file    Review of Systems: See HPI, otherwise negative ROS  Physical Exam: BP (!) 147/58   Pulse 90   Temp 99 F (37.2 C)   Resp 20   Ht 5' 1.5" (1.562 m)   Wt 96.4 kg   SpO2 99%   BMI 39.51 kg/m  General:   Alert,  pleasant and cooperative in NAD Head:  Normocephalic and atraumatic. Neck:  Supple; no masses or thyromegaly. Lungs:  Clear throughout to auscultation, normal respiratory effort.    Heart:  +S1, +S2, Regular rate and rhythm, No edema. Abdomen:  Soft, nontender and nondistended. Normal bowel sounds, without guarding, and without rebound.   Neurologic:  Alert and  oriented x4;  grossly normal neurologically.  Impression/Plan: Margaret [name] is here for an endoscopy and colonoscopy  to be performed for  evaluation of iron deficiency anemia    Risks, benefits, limitations, and alternatives regarding endoscopy have been reviewed with the patient.  Questions have been answered.  All parties agreeable.   Jonathon Bellows, MD  06/23/2019, 11:20 AM

## 2019-06-23 NOTE — Progress Notes (Signed)
[facility] NAME: Margaret [name]    MR#:  [id number]  DATE OF BIRTH:  10-05-1956  SUBJECTIVE:  CHIEF COMPLAINT:   Chief Complaint  Patient presents with  . Abnormal Lab   -Patient going for EGD and colonoscopy today.  Denies any complaints other than generalized weakness  REVIEW OF SYSTEMS:  Review of Systems  Constitutional: Positive for malaise/fatigue. Negative for chills and fever.  HENT: Negative for congestion, ear discharge, hearing loss and nosebleeds.   Eyes: Negative for blurred vision and double vision.  Respiratory: Negative for cough, shortness of breath and wheezing.   Cardiovascular: Negative for chest pain and palpitations.  Gastrointestinal: Negative for abdominal pain, constipation, diarrhea, nausea and vomiting.  Genitourinary: Negative for dysuria.  Musculoskeletal: Negative for myalgias.  Neurological: Positive for weakness. Negative for dizziness, speech change, seizures and headaches.  Psychiatric/Behavioral: Negative for depression.    DRUG ALLERGIES:   Allergies  Allergen Reactions  . Penicillins Hives    Resulted in hospitalization  . Sulfa Antibiotics Hives    Resulted in hospitalization    VITALS:  Blood pressure (!) 155/52, pulse 91, temperature 99 F (37.2 C), temperature source Oral, resp. rate 20, height 5' 1.5" (1.562 m), weight 96.4 kg, SpO2 100 %.  PHYSICAL EXAMINATION:  Physical Exam   GENERAL:  63 y.o.-year-old patient lying in the bed with no acute distress.  EYES: Pupils equal, round, reactive to light and accommodation. No scleral icterus. Extraocular muscles intact.  Pale conjunctiva  HEENT: Head atraumatic, normocephalic. Oropharynx and nasopharynx clear.  NECK:  Supple, no jugular venous distention. No thyroid enlargement, no tenderness.  LUNGS: Normal breath sounds bilaterally, no wheezing, rales,rhonchi or crepitation. No use of accessory muscles of respiration. Decreased  bibasilar breath sounds CARDIOVASCULAR: S1, S2 normal. No murmurs, rubs, or gallops.  ABDOMEN: Soft, nontender, nondistended. Bowel sounds present. No organomegaly or mass.  EXTREMITIES: No pedal edema, cyanosis, or clubbing.  NEUROLOGIC: Cranial nerves II through XII are intact. Muscle strength 5/5 in all extremities. Sensation intact. Gait not checked.  PSYCHIATRIC: The patient is alert and oriented x 3.  SKIN: No obvious rash, lesion, or ulcer.    LABORATORY PANEL:   CBC Recent Labs  Lab 06/23/19 0839  WBC 5.8  HGB 7.6*  HCT 26.0*  PLT 109*   ------------------------------------------------------------------------------------------------------------------  Chemistries  Recent Labs  Lab 06/21/19 1725 06/22/19 0502  NA 136 139  K 3.7 3.7  CL 107 109  CO2 22 22  GLUCOSE 111* 100*  BUN 10 8  CREATININE 0.65 0.69  CALCIUM 8.2* 8.4*  AST 41  --   ALT 25  --   ALKPHOS 76  --   BILITOT 0.9  --    ------------------------------------------------------------------------------------------------------------------  Cardiac Enzymes No results for input(s): TROPONINI in the last 168 hours. ------------------------------------------------------------------------------------------------------------------  RADIOLOGY:  Ct Abdomen Pelvis W Contrast  Result Date: 06/21/2019 CLINICAL DATA:  Fall, possible hernia, abdominal swelling and weight gain EXAM: CT ABDOMEN AND PELVIS WITH CONTRAST TECHNIQUE: Multidetector CT imaging of the abdomen and pelvis was performed using the standard protocol following bolus administration of intravenous contrast. CONTRAST:  176mL OMNIPAQUE IOHEXOL 300 MG/ML SOLN, additional oral enteric contrast COMPARISON:  None. FINDINGS: Lower chest: No acute abnormality. Hepatobiliary: Coarse, nodular contour of the liver. Status post cholecystectomy. No biliary ductal dilatation. Pancreas: Unremarkable. No pancreatic ductal dilatation or surrounding inflammatory  changes. Spleen: Splenomegaly, maximum coronal span 15.7 cm Adrenals/Urinary Tract: Adrenal glands are unremarkable. Kidneys are  normal, without renal calculi, solid lesion, or hydronephrosis. Bladder is unremarkable. Stomach/Bowel: Stomach is within normal limits. Appendix appears normal. No evidence of bowel wall thickening, distention, or inflammatory changes. Vascular/Lymphatic: Aortic atherosclerosis. No enlarged abdominal or pelvic lymph nodes. Reproductive: Status post hysterectomy. Other: There is a small, broad-based fat containing midline ventral hernia measuring 5.7 x 3.2 x 2.2 cm (series 2, image 53, series 6, image 70). Small volume ascites. Musculoskeletal: No acute or significant osseous findings. Status post left hip total arthroplasty. IMPRESSION: 1.  Stigmata of cirrhosis, splenomegaly, and small volume ascites. 2. There is a small, broad-based fat containing midline ventral hernia measuring 5.7 x 3.2 x 2.2 cm (series 2, image 53, series 6, image 70). 3. Other chronic, incidental, and postoperative findings as detailed above. Electronically Signed   By: Eddie Candle M.D.   On: 06/21/2019 16:36    EKG:  No orders found for this or any previous visit.  ASSESSMENT AND PLAN:   63 year old female with past medical history significant for diabetes, hypertension, bipolar disorder comes to the hospital sent by PCP, secondary to weakness and noted to have a hemoglobin of 5.7  1.  Acute on chronic iron deficiency anemia-concern for GI blood loss -Admitted, received 2 units of blood transfusion.  Hemoglobin at 7.6 -Started on IV iron -Appreciate GI consult.  Patient having EGD and colonoscopy today -If negative, will need to be scheduled for capsule endoscopy -Outpatient follow-up with hematology clinic for IV iron infusions -Continue Protonix.  2.  Liver cirrhosis with splenomegaly-nonalcoholic cirrhosis. -Outpatient follow-up  3.  Hypertension-on Norvasc  4.  Bipolar disorder-patient  on Abilify, Celexa, Lamictal  5.  DVT prophylaxis-teds and SCDs  Encourage ambulation.  Anticipate discharge tomorrow if stable   All the records are reviewed and case discussed with Care Management/Social Workerr. Management plans discussed with the patient, family and they are in agreement.  CODE STATUS: Full code  TOTAL TIME TAKING CARE OF THIS PATIENT: 37 minutes.   POSSIBLE D/C IN 1-2 DAYS, DEPENDING ON CLINICAL CONDITION.   Gladstone Lighter M.D on 06/23/2019 at 2:05 PM  Between 7am to 6pm - Pager - [phone]  After 6pm go to [email or web address] - password [email or web address] [facility]  Office  [phone]  CC: Primary care physician; Rusty Aus, MD

## 2019-06-23 NOTE — Op Note (Addendum)
[facility] Gastroenterology Patient Name: Margaret [name] Procedure Date: 06/23/2019 11:19 AM MRN: [id number] Account #: [id number] Date of Birth: 08/29/1956 Admit Type: Outpatient Age: 63 Room: [facility] ENDO ROOM 4 Gender: Female Note Status: Finalized Procedure:            Upper GI endoscopy Indications:          Iron deficiency anemia Providers:            Jonathon Bellows MD, MD Medicines:            Monitored Anesthesia Care Complications:        No immediate complications. Procedure:            Pre-Anesthesia Assessment:                       - Prior to the procedure, a History and Physical was                        performed, and patient medications, allergies and                        sensitivities were reviewed. The patient's tolerance of                        previous anesthesia was reviewed.                       - The risks and benefits of the procedure and the                        sedation options and risks were discussed with the                        patient. All questions were answered and informed                        consent was obtained.                       - ASA Grade Assessment: III - A patient with severe                        systemic disease.                       After obtaining informed consent, the endoscope was                        passed under direct vision. Throughout the procedure,                        the patient's blood pressure, pulse, and oxygen                        saturations were monitored continuously. The Endoscope                        was introduced through the mouth, and advanced to the                        third part of duodenum. The upper GI endoscopy was  accomplished with ease. The patient tolerated the                        procedure well. Findings:      The examined duodenum was normal. Biopsies for histology were taken with       a cold forceps for evaluation of celiac disease.   The esophagus was normal.      The stomach was normal.      The cardia and gastric fundus were normal on retroflexion. Impression:           - Normal examined duodenum. Biopsied.                       - Normal esophagus.                       - Normal stomach. Recommendation:       - Await pathology results.                       - Perform a colonoscopy today. Procedure Code(s):    --- Professional ---                       [phone], Esophagogastroduodenoscopy, flexible, transoral;                        with biopsy, single or multiple Diagnosis Code(s):    --- Professional ---                       D50.9, Iron deficiency anemia, unspecified CPT copyright 2019 American Medical Association. All rights reserved. The codes documented in this report are preliminary and upon coder review may  be revised to meet current compliance requirements. Jonathon Bellows, MD Jonathon Bellows MD, MD 06/23/2019 11:45:54 AM This report has been signed electronically. Number of Addenda: 0 Note Initiated On: 06/23/2019 11:19 AM Estimated Blood Loss: Estimated blood loss: none.      [facility]

## 2019-06-23 NOTE — Anesthesia Post-op Follow-up Note (Signed)
Anesthesia QCDR form completed.        

## 2019-06-23 NOTE — Transfer of Care (Signed)
Immediate Anesthesia Transfer of Care Note  Patient: Margaret [name]  Procedure(s) Performed: ESOPHAGOGASTRODUODENOSCOPY (EGD) WITH PROPOFOL (N/A ) COLONOSCOPY WITH PROPOFOL (N/A )  Patient Location: PACU  Anesthesia Type:General  Level of Consciousness: awake and sedated  Airway & Oxygen Therapy: Patient Spontanous Breathing and Patient connected to nasal cannula oxygen  Post-op Assessment: Report given to RN and Post -op Vital signs reviewed and stable  Post vital signs: Reviewed and stable  Last Vitals:  Vitals Value Taken Time  BP 120/53 06/23/19 1210  Temp 37.2 C 06/23/19 1210  Pulse 84 06/23/19 1210  Resp 22 06/23/19 1210  SpO2 98 % 06/23/19 1210    Last Pain:  Vitals:   06/23/19 1210  TempSrc:   PainSc: Asleep         Complications: No apparent anesthesia complications

## 2019-06-23 NOTE — Op Note (Addendum)
[facility] Gastroenterology Patient Name: Margaret [name] Procedure Date: 06/23/2019 11:19 AM MRN: [id number] Account #: [id number] Date of Birth: 05-07-1956 Admit Type: Outpatient Age: 63 Room: [facility] ENDO ROOM 4 Gender: Female Note Status: Finalized Procedure:            Colonoscopy Indications:          Iron deficiency anemia Providers:            Jonathon Bellows MD, MD Medicines:            Monitored Anesthesia Care Complications:        No immediate complications. Procedure:            Pre-Anesthesia Assessment:                       - Prior to the procedure, a History and Physical was                        performed, and patient medications, allergies and                        sensitivities were reviewed. The patient's tolerance of                        previous anesthesia was reviewed.                       - The risks and benefits of the procedure and the                        sedation options and risks were discussed with the                        patient. All questions were answered and informed                        consent was obtained.                       - ASA Grade Assessment: III - A patient with severe                        systemic disease.                       After obtaining informed consent, the colonoscope was                        passed under direct vision. Throughout the procedure,                        the patient's blood pressure, pulse, and oxygen                        saturations were monitored continuously. The                        Colonoscope was introduced through the anus and                        advanced to the the cecum, identified by the  appendiceal orifice. The colonoscopy was performed with                        ease. The patient tolerated the procedure well. The                        quality of the bowel preparation was excellent. Findings:      The perianal and digital rectal examinations  were normal.      Two sessile polyps were found in the sigmoid colon. The polyps were 6 to       8 mm in size. These polyps were removed with a cold snare. Resection and       retrieval were complete.      A 6 mm polyp was found in the rectum. The polyp was sessile. The polyp       was removed with a cold snare. Resection and retrieval were complete. To       prevent bleeding after the polypectomy, one hemostatic clip was       successfully placed. There was no bleeding at the end of the procedure.      The exam was otherwise without abnormality on direct and retroflexion       views. Impression:           - Two 6 to 8 mm polyps in the sigmoid colon, removed                        with a cold snare. Resected and retrieved.                       - One 6 mm polyp in the rectum, removed with a cold                        snare. Resected and retrieved. Clip was placed.                       - The examination was otherwise normal on direct and                        retroflexion views. Recommendation:       - Return patient to hospital ward for possible                        discharge same day.                       - Advance diet as tolerated.                       - Continue present medications.                       - Await pathology results.                       - Repeat colonoscopy for surveillance based on                        pathology results.                       - To visualize the small bowel, perform  video capsule                        endoscopy in 2 weeks.                       - Return to my office in 1 week.                       - Can go home from GI point of view                       Stop all saids Procedure Code(s):    --- Professional ---                       [phone], Colonoscopy, flexible; with removal of tumor(s),                        polyp(s), or other lesion(s) by snare technique Diagnosis Code(s):    --- Professional ---                       K63.5, Polyp of  colon                       D50.9, Iron deficiency anemia, unspecified                       K62.1, Rectal polyp CPT copyright 2019 American Medical Association. All rights reserved. The codes documented in this report are preliminary and upon coder review may  be revised to meet current compliance requirements. Jonathon Bellows, MD Jonathon Bellows MD, MD 06/23/2019 12:08:09 PM This report has been signed electronically. Number of Addenda: 0 Note Initiated On: 06/23/2019 11:19 AM Scope Withdrawal Time: 0 hours 14 minutes 13 seconds  Total Procedure Duration: 0 hours 16 minutes 6 seconds  Estimated Blood Loss: Estimated blood loss: none.      [facility]

## 2019-06-23 NOTE — Anesthesia Procedure Notes (Signed)
Date/Time: 06/23/2019 11:59 AM Performed by: Nelda Marseille, CRNA Pre-anesthesia Checklist: Patient identified, Emergency Drugs available, Suction available, Patient being monitored and Timeout performed Oxygen Delivery Method: Nasal cannula

## 2019-06-24 LAB — GLUCOSE, CAPILLARY
Glucose-Capillary: 133 mg/dL — ABNORMAL HIGH (ref 70–99)
Glucose-Capillary: 203 mg/dL — ABNORMAL HIGH (ref 70–99)

## 2019-06-24 LAB — CBC
HCT: 25.5 % — ABNORMAL LOW (ref 36.0–46.0)
Hemoglobin: 7.2 g/dL — ABNORMAL LOW (ref 12.0–15.0)
MCH: 21 pg — ABNORMAL LOW (ref 26.0–34.0)
MCHC: 28.2 g/dL — ABNORMAL LOW (ref 30.0–36.0)
MCV: 74.3 fL — ABNORMAL LOW (ref 80.0–100.0)
Platelets: 101 10*3/uL — ABNORMAL LOW (ref 150–400)
RBC: 3.43 MIL/uL — ABNORMAL LOW (ref 3.87–5.11)
RDW: 21.1 % — ABNORMAL HIGH (ref 11.5–15.5)
WBC: 5.5 10*3/uL (ref 4.0–10.5)
nRBC: 0.4 % — ABNORMAL HIGH (ref 0.0–0.2)

## 2019-06-24 LAB — CELIAC DISEASE PANEL
Endomysial Ab, IgA: NEGATIVE
IgA: 338 mg/dL (ref 87–352)
Tissue Transglutaminase Ab, IgA: <2 U/mL (ref 0–3)

## 2019-06-24 MED ORDER — NYSTATIN-TRIAMCINOLONE 100000-0.1 UNIT/GM-% EX CREA: TOPICAL_CREAM | Freq: Two times a day (BID) | CUTANEOUS | 0 refills | Status: DC

## 2019-06-24 MED ORDER — B COMPLEX-C PO TABS: 1.0000 | ORAL_TABLET | Freq: Every day | ORAL | 2 refills | Status: DC

## 2019-06-24 MED ORDER — FERROUS SULFATE 325 (65 FE) MG PO TABS: 325.0000 mg | ORAL_TABLET | Freq: Two times a day (BID) | ORAL | 0 refills | Status: DC

## 2019-06-24 NOTE — Plan of Care (Signed)
  Problem: Education: Goal: Knowledge of General Education information will improve Description: Including pain rating scale, medication(s)/side effects and non-pharmacologic comfort measures Outcome: Adequate for Discharge   Problem: Health Behavior/Discharge Planning: Goal: Ability to manage health-related needs will improve Outcome: Adequate for Discharge   Problem: Clinical Measurements: Goal: Ability to maintain clinical measurements within normal limits will improve Outcome: Adequate for Discharge Goal: Will remain free from infection Outcome: Adequate for Discharge Goal: Diagnostic test results will improve Outcome: Adequate for Discharge Goal: Respiratory complications will improve Outcome: Adequate for Discharge Goal: Cardiovascular complication will be avoided Outcome: Adequate for Discharge   Problem: Activity: Goal: Risk for activity intolerance will decrease Outcome: Adequate for Discharge   Problem: Coping: Goal: Level of anxiety will decrease Outcome: Adequate for Discharge   Problem: Pain Managment: Goal: General experience of comfort will improve Outcome: Adequate for Discharge   Problem: Safety: Goal: Ability to remain free from injury will improve Outcome: Adequate for Discharge   Problem: Skin Integrity: Goal: Risk for impaired skin integrity will decrease Outcome: Adequate for Discharge   

## 2019-06-24 NOTE — Progress Notes (Signed)
MD order received in [facility] to discharge pt home today; verbally reviewed AVS with pt, Rxs escribed to [place] in [place], [place]; no questions voiced at this time; pt's discharge pending arrival of her ride home at the [facility] entrance

## 2019-06-24 NOTE — Progress Notes (Signed)
Pt's ride present; pt discharged via wheelchair by nursing to the [facility] mall entrance 

## 2019-06-25 LAB — BPAM RBC
Blood Product Expiration Date: 202009022359
Blood Product Expiration Date: 202009032359
Blood Product Expiration Date: 202009162359
ISSUE DATE / TIME: 202008191926
ISSUE DATE / TIME: 202008192209
Unit Type and Rh: 1700
Unit Type and Rh: 5100
Unit Type and Rh: 7300

## 2019-06-25 LAB — TYPE AND SCREEN
ABO/RH(D): B POS
Antibody Screen: NEGATIVE
Unit division: 0
Unit division: 0
Unit division: 0

## 2019-06-25 NOTE — Discharge Summary (Signed)
[facility] NAME: Margaret [name]    MR#:  [id number]  DATE OF BIRTH:  06-Apr-1956  DATE OF ADMISSION:  06/21/2019   ADMITTING PHYSICIAN: Lance Coon, MD  DATE OF DISCHARGE: 06/24/2019  3:12 PM  PRIMARY CARE PHYSICIAN: Rusty Aus, MD   ADMISSION DIAGNOSIS:   Symptomatic anemia [D64.9] Iron deficiency anemia, unspecified iron deficiency anemia type [D50.9]  DISCHARGE DIAGNOSIS:   Principal Problem:   Anemia Active Problems:   HTN (hypertension)   Diabetes ([place])   CKD (chronic kidney disease), stage III ([place])   SECONDARY DIAGNOSIS:   Past Medical History:  Diagnosis Date  . Bipolar 1 disorder ([place])   . Diabetes mellitus without complication (HCC)    Type 2  . Hypertension     HOSPITAL COURSE:   63 year old female with past medical history significant for diabetes, hypertension, bipolar disorder comes to the hospital sent by PCP, secondary to weakness and noted to have a hemoglobin of 5.7  1.  Acute on chronic iron deficiency anemia-concern for GI blood loss -Admitted, received 2 units of blood transfusion.  Hemoglobin stable at 7.6 -Received 3 doses of IV iron.  Will be discharged on oral iron. -Appreciate GI consult.    Patient had EGD and colonoscopy this admission.  EGD was completely normal, colonoscopy with polyps noted throughout the colon and liver biopsied.  No active source of bleeding noted. -Schedule for capsule endoscopy as outpatient. -Cancer center follow-up for IV iron infusions as needed. -Continue proton  2.  Liver cirrhosis with splenomegaly-nonalcoholic cirrhosis. -Outpatient follow-up  3.  Hypertension-on Norvasc  4.  Bipolar disorder-patient on Abilify, Celexa, Lamictal  5.  Diabetes mellitus-on Trulicity.  Advised to follow-up with PCP regarding Lantus as her sugars were within normal limits in the hospital.  Patient also on metformin.  Patient independent at baseline.  Will be  discharged home today    DISCHARGE CONDITIONS:   Guarded  CONSULTS OBTAINED:   None  DRUG ALLERGIES:   Allergies  Allergen Reactions  . Penicillins Hives    Resulted in hospitalization  . Sulfa Antibiotics Hives    Resulted in hospitalization   DISCHARGE MEDICATIONS:   Allergies as of 06/24/2019      Reactions   Penicillins Hives   Resulted in hospitalization   Sulfa Antibiotics Hives   Resulted in hospitalization      Medication List    STOP taking these medications   hydrochlorothiazide 25 MG tablet Commonly known as: HYDRODIURIL   Lantus SoloStar 100 UNIT/ML Solostar Pen Generic drug: Insulin Glargine   naproxen 500 MG tablet Commonly known as: NAPROSYN     TAKE these medications   alendronate 35 MG tablet Commonly known as: FOSAMAX Take 35 mg by mouth every 7 (seven) days. Take with a full glass of water on an empty stomach.   amLODipine 10 MG tablet Commonly known as: NORVASC Take 10 mg by mouth daily.   ARIPiprazole 5 MG tablet Commonly known as: ABILIFY Take 5 mg by mouth daily.   ARIPiprazole 2 MG tablet Commonly known as: ABILIFY Take 2 mg by mouth daily.   B-complex with vitamin C tablet Take 1 tablet by mouth daily.   citalopram 40 MG tablet Commonly known as: CELEXA Take 40 mg by mouth daily.   ferrous sulfate 325 (65 FE) MG tablet Take 1 tablet (325 mg total) by mouth 2 (two) times daily with a meal.   gabapentin 600 MG tablet Commonly  known as: NEURONTIN Take 600 mg by mouth 3 (three) times daily.   HYDROcodone-acetaminophen 10-325 MG tablet Commonly known as: NORCO Take 1 tablet by mouth 3 (three) times daily.   lamoTRIgine 200 MG tablet Commonly known as: LAMICTAL Take 200 mg by mouth daily.   metFORMIN 500 MG tablet Commonly known as: GLUCOPHAGE Take 500 mg by mouth 2 (two) times daily with a meal.   nystatin-triamcinolone cream Commonly known as: MYCOLOG II Apply topically 2 (two) times daily.   pantoprazole  40 MG tablet Commonly known as: PROTONIX Take 40 mg by mouth daily.   Trulicity [id number] Sopn Generic drug: Dulaglutide Inject 0.75 mg into the skin once a week. Thursday        DISCHARGE INSTRUCTIONS:   1.  PCP follow-up in 1 to 2 weeks 2.  GI follow-up in 1 week for capsule endoscopy 3.  Hematology follow-up for IV iron infusions in 1 to 2 weeks  DIET:   Cardiac diet  ACTIVITY:   Activity as tolerated  OXYGEN:   Home Oxygen: No.  Oxygen Delivery: room air  DISCHARGE LOCATION:   home   If you experience worsening of your admission symptoms, develop shortness of breath, life threatening emergency, suicidal or homicidal thoughts you must seek medical attention immediately by calling 911 or calling your MD immediately  if symptoms less severe.  You Must read complete instructions/literature along with all the possible adverse reactions/side effects for all the Medicines you take and that have been prescribed to you. Take any new Medicines after you have completely understood and accpet all the possible adverse reactions/side effects.   Please note  You were cared for by a hospitalist during your hospital stay. If you have any questions about your discharge medications or the care you received while you were in the hospital after you are discharged, you can call the unit and asked to speak with the hospitalist on call if the hospitalist that took care of you is not available. Once you are discharged, your primary care physician will handle any further medical issues. Please note that NO REFILLS for any discharge medications will be authorized once you are discharged, as it is imperative that you return to your primary care physician (or establish a relationship with a primary care physician if you do not have one) for your aftercare needs so that they can reassess your need for medications and monitor your lab values.    On the day of Discharge:  VITAL SIGNS:   Blood  pressure (!) 133/58, pulse 75, temperature 98.6 F (37 C), temperature source Oral, resp. rate 16, height 5' 1.5" (1.562 m), weight 96.4 kg, SpO2 94 %.  PHYSICAL EXAMINATION:     GENERAL:  63 y.o.-year-old patient lying in the bed with no acute distress.  EYES: Pupils equal, round, reactive to light and accommodation. No scleral icterus. Extraocular muscles intact.  Pale conjunctiva  HEENT: Head atraumatic, normocephalic. Oropharynx and nasopharynx clear.  Tongue is erythematous and bald. NECK:  Supple, no jugular venous distention. No thyroid enlargement, no tenderness.  LUNGS: Normal breath sounds bilaterally, no wheezing, rales,rhonchi or crepitation. No use of accessory muscles of respiration. Decreased bibasilar breath sounds CARDIOVASCULAR: S1, S2 normal. No murmurs, rubs, or gallops.  ABDOMEN: Soft, nontender, nondistended. Bowel sounds present. No organomegaly or mass.  EXTREMITIES: No pedal edema, cyanosis, or clubbing.  NEUROLOGIC: Cranial nerves II through XII are intact. Muscle strength 5/5 in all extremities. Sensation intact. Gait not checked.  PSYCHIATRIC: The  patient is alert and oriented x 3.  SKIN: No obvious rash, lesion, or ulcer.  Macerated skin under abdominal folds noted.  DATA REVIEW:   CBC Recent Labs  Lab 06/24/19 0604  WBC 5.5  HGB 7.2*  HCT 25.5*  PLT 101*    Chemistries  Recent Labs  Lab 06/21/19 1725 06/22/19 0502  NA 136 139  K 3.7 3.7  CL 107 109  CO2 22 22  GLUCOSE 111* 100*  BUN 10 8  CREATININE 0.65 0.69  CALCIUM 8.2* 8.4*  AST 41  --   ALT 25  --   ALKPHOS 76  --   BILITOT 0.9  --      Microbiology Results  Results for orders placed or performed during the hospital encounter of 06/21/19  SARS Coronavirus 2 [facility] order, Performed in [facility] hospital lab) Nasopharyngeal Nasopharyngeal Swab     Status: None   Collection Time: 06/21/19  5:25 PM   Specimen: Nasopharyngeal Swab  Result Value Ref Range Status   SARS  Coronavirus 2 NEGATIVE NEGATIVE Final    Comment: (NOTE) If result is NEGATIVE SARS-CoV-2 target nucleic acids are NOT DETECTED. The SARS-CoV-2 RNA is generally detectable in upper and lower  respiratory specimens during the acute phase of infection. The lowest  concentration of SARS-CoV-2 viral copies this assay can detect is 250  copies / mL. A negative result does not preclude SARS-CoV-2 infection  and should not be used as the sole basis for treatment or other  patient management decisions.  A negative result may occur with  improper specimen collection / handling, submission of specimen other  than nasopharyngeal swab, presence of viral mutation(s) within the  areas targeted by this assay, and inadequate number of viral copies  (<250 copies / mL). A negative result must be combined with clinical  observations, patient history, and epidemiological information. If result is POSITIVE SARS-CoV-2 target nucleic acids are DETECTED. The SARS-CoV-2 RNA is generally detectable in upper and lower  respiratory specimens dur ing the acute phase of infection.  Positive  results are indicative of active infection with SARS-CoV-2.  Clinical  correlation with patient history and other diagnostic information is  necessary to determine patient infection status.  Positive results do  not rule out bacterial infection or co-infection with other viruses. If result is PRESUMPTIVE POSTIVE SARS-CoV-2 nucleic acids MAY BE PRESENT.   A presumptive positive result was obtained on the submitted specimen  and confirmed on repeat testing.  While 2019 novel coronavirus  (SARS-CoV-2) nucleic acids may be present in the submitted sample  additional confirmatory testing may be necessary for epidemiological  and / or clinical management purposes  to differentiate between  SARS-CoV-2 and other Sarbecovirus currently known to infect humans.  If clinically indicated additional testing with an alternate test   methodology [phone]) is advised. The SARS-CoV-2 RNA is generally  detectable in upper and lower respiratory sp ecimens during the acute  phase of infection. The expected result is Negative. Fact Sheet for Patients:  StrictlyIdeas.no Fact Sheet for Healthcare Providers: BankingDealers.co.za This test is not yet approved or cleared by the [place] FDA and has been authorized for detection and/or diagnosis of SARS-CoV-2 by FDA under an Emergency Use Authorization (EUA).  This EUA will remain in effect (meaning this test can be used) for the duration of the COVID-19 declaration under Section 564(b)(1) of the Act, 21 U.S.C. section 360bbb-3(b)(1), unless the authorization is terminated or revoked sooner. Performed at [facility], [phone]  [address] [place], [place] [address]     RADIOLOGY:  No results found.   Management plans discussed with the patient, family and they are in agreement.  CODE STATUS:  Code Status History    Date Active Date Inactive Code Status Order ID Comments User Context   06/21/2019 2321 06/24/2019 1844 Full Code [id number]  Lance Coon, MD Inpatient   Advance Care Planning Activity      TOTAL TIME TAKING CARE OF THIS PATIENT: 38 minutes.    Gladstone Lighter M.D on 06/25/2019 at 1:13 PM  Between 7am to 6pm - Pager - [phone]  After 6pm go to [email or web address] - Proofreader  [facility]  Office  [phone]  CC: Primary care physician; Rusty Aus, MD   Note: This dictation was prepared with Dragon dictation along with smaller phrase technology. Any transcriptional errors that result from this process are unintentional.

## 2019-06-26 ENCOUNTER — Encounter: Payer: Self-pay | Admitting: Gastroenterology

## 2019-06-26 LAB — SURGICAL PATHOLOGY

## 2019-06-26 NOTE — Anesthesia Preprocedure Evaluation (Signed)
Anesthesia Evaluation  Patient identified by MRN, date of birth, ID band Patient awake    Reviewed: Allergy & Precautions, H&P , NPO status , Patient's Chart, lab work & pertinent test results  Airway Mallampati: II  TM Distance: >3 FB Neck ROM: full    Dental   Pulmonary neg pulmonary ROS, former smoker,           Cardiovascular hypertension,      Neuro/Psych PSYCHIATRIC DISORDERS Bipolar Disorder negative neurological ROS     GI/Hepatic negative GI ROS, Neg liver ROS,   Endo/Other  diabetes  Renal/GU CRFRenal disease  negative genitourinary   Musculoskeletal   Abdominal   Peds  Hematology  (+) Blood dyscrasia, anemia ,   Anesthesia Other Findings Past Medical History: No date: Bipolar 1 disorder (HCC) No date: Diabetes mellitus without complication (HCC)     Comment:  Type 2 No date: Hypertension  Past Surgical History: No date: ABDOMINAL HYSTERECTOMY No date: CHOLECYSTECTOMY 06/23/2019: COLONOSCOPY WITH PROPOFOL; N/A     Comment:  Procedure: COLONOSCOPY WITH PROPOFOL;  Surgeon: Jonathon Bellows, MD;  Location: [facility] ENDOSCOPY;  Service:               Gastroenterology;  Laterality: N/A; 06/23/2019: ESOPHAGOGASTRODUODENOSCOPY (EGD) WITH PROPOFOL; N/A     Comment:  Procedure: ESOPHAGOGASTRODUODENOSCOPY (EGD) WITH               PROPOFOL;  Surgeon: Jonathon Bellows, MD;  Location: [facility]               ENDOSCOPY;  Service: Gastroenterology;  Laterality: N/A; No date: TONSILLECTOMY  BMI    Body Mass Index: 39.51 kg/m      Reproductive/Obstetrics negative OB ROS                             Anesthesia Physical Anesthesia Plan  ASA: II  Anesthesia Plan: General   Post-op Pain Management:    Induction:   PONV Risk Score and Plan: Propofol infusion and TIVA  Airway Management Planned: Nasal Cannula and Natural Airway  Additional Equipment:   Intra-op Plan:   Post-operative  Plan:   Informed Consent: I have reviewed the patients History and Physical, chart, labs and discussed the procedure including the risks, benefits and alternatives for the proposed anesthesia with the patient or authorized representative who has indicated his/her understanding and acceptance.     Dental Advisory Given  Plan Discussed with: Anesthesiologist and CRNA  Anesthesia Plan Comments:         Anesthesia Quick Evaluation

## 2019-06-26 NOTE — Anesthesia Postprocedure Evaluation (Signed)
Anesthesia Post Note  Patient: Margaret [name]  Procedure(s) Performed: ESOPHAGOGASTRODUODENOSCOPY (EGD) WITH PROPOFOL (N/A ) COLONOSCOPY WITH PROPOFOL (N/A )  Patient location during evaluation: PACU Anesthesia Type: General Level of consciousness: awake and alert Pain management: pain level controlled Vital Signs Assessment: post-procedure vital signs reviewed and stable Respiratory status: spontaneous breathing, nonlabored ventilation and respiratory function stable Cardiovascular status: blood pressure returned to baseline and stable Postop Assessment: no apparent nausea or vomiting Anesthetic complications: no     Last Vitals:  Vitals:   06/23/19 1928 06/24/19 0402  BP: (!) 137/55 (!) 133/58  Pulse: 86 75  Resp: 16 16  Temp: 37.2 C 37 C  SpO2: 99% 94%    Last Pain:  Vitals:   06/24/19 1006  TempSrc:   PainSc: 0-No pain                 Durenda Hurt

## 2019-07-04 DIAGNOSIS — D500 Iron deficiency anemia secondary to blood loss (chronic): Secondary | ICD-10-CM | POA: Insufficient documentation

## 2019-07-04 DIAGNOSIS — D369 Benign neoplasm, unspecified site: Secondary | ICD-10-CM | POA: Insufficient documentation

## 2019-07-04 DIAGNOSIS — K7469 Other cirrhosis of liver: Secondary | ICD-10-CM | POA: Insufficient documentation

## 2019-07-04 DIAGNOSIS — K7460 Unspecified cirrhosis of liver: Secondary | ICD-10-CM | POA: Insufficient documentation

## 2019-07-04 DIAGNOSIS — K7581 Nonalcoholic steatohepatitis (NASH): Secondary | ICD-10-CM | POA: Insufficient documentation

## 2019-07-06 ENCOUNTER — Encounter: Payer: Self-pay | Admitting: Gastroenterology

## 2019-07-12 ENCOUNTER — Ambulatory Visit (INDEPENDENT_AMBULATORY_CARE_PROVIDER_SITE_OTHER): Payer: Medicaid Other | Admitting: Gastroenterology

## 2019-07-12 DIAGNOSIS — Z791 Long term (current) use of non-steroidal anti-inflammatories (NSAID): Secondary | ICD-10-CM | POA: Diagnosis not present

## 2019-07-12 DIAGNOSIS — K7460 Unspecified cirrhosis of liver: Secondary | ICD-10-CM

## 2019-07-12 DIAGNOSIS — R188 Other ascites: Secondary | ICD-10-CM | POA: Diagnosis not present

## 2019-07-12 DIAGNOSIS — D509 Iron deficiency anemia, unspecified: Secondary | ICD-10-CM | POA: Diagnosis not present

## 2019-07-12 NOTE — Progress Notes (Signed)
Margaret [name] , MD [address]  [place], [place] [address]  Main: [phone]  Fax: [phone]   Primary Care Physician: Rusty Aus, MD  Virtual Visit via Video Note  I connected with patient on 07/12/19 at 11:15 AM EDT by video and verified that I am speaking with the correct person using two identifiers.   I discussed the limitations, risks, security and privacy concerns of performing an evaluation and management service by video  and the availability of in person appointments. I also discussed with the patient that there may be a patient responsible charge related to this service. The patient expressed understanding and agreed to proceed.  Location of Patient: Home Location of Provider: Home Persons involved: Patient and provider only   History of Present Illness: Hospital follow-up visit  HPI: Margaret [name] is a 63 y.o. female  She was admitted on 06/21/2019 for anemia.  I was consulted to see her for the same.  On admission was found to have a hemoglobin of 5.7 with an MCV of 69.4.  Ferritin of 4, iron of 13.  Normal B12, folate, celiac serology.  She has never had a colonoscopy.  Mother had colon polyps.  Has been taking 1-2 naproxen's for hip pain few times a day for the past 18 months which I felt was the cause of her anemia.  I performed an EGD and colonoscopy on 06/23/2019.  EGD showed no abnormalities and the colonoscopy showed 3 sessile diminutive polyps which were resected completely.  Arteries of the duodenum were normal.  3 colon polyps resected were serrated polyp x2 and a tubular adenoma x1.    Interval history 06/24/2019-07/12/2019  06/21/2019: Scan of the abdomen showed features of cirrhosis, splenomegaly and small volume ascites.  Small broad-based fat-containing midline ventral hernia.  Denies any family history of liver disease. Taking iron tablets and stool is black. On hydrocodone. Has taken an odd Naprosyn.  Feels much better feels  stronger.    Current Outpatient Medications  Medication Sig Dispense Refill  . alendronate (FOSAMAX) 35 MG tablet Take 35 mg by mouth every 7 (seven) days. Take with a full glass of water on an empty stomach.    Marland Kitchen amLODipine (NORVASC) 10 MG tablet Take 10 mg by mouth daily.    . ARIPiprazole (ABILIFY) 2 MG tablet Take 2 mg by mouth daily.    . ARIPiprazole (ABILIFY) 5 MG tablet Take 5 mg by mouth daily.    . B Complex-C (B-COMPLEX WITH VITAMIN C) tablet Take 1 tablet by mouth daily. 30 tablet 2  . citalopram (CELEXA) 40 MG tablet Take 40 mg by mouth daily.    . Dulaglutide (TRULICITY) [id number] 0000000 SOPN Inject 0.75 mg into the skin once a week. Thursday    . ferrous sulfate 325 (65 FE) MG tablet Take 1 tablet (325 mg total) by mouth 2 (two) times daily with a meal. 120 tablet 0  . gabapentin (NEURONTIN) 600 MG tablet Take 600 mg by mouth 3 (three) times daily.    Marland Kitchen HYDROcodone-acetaminophen (NORCO) 10-325 MG tablet Take 1 tablet by mouth 3 (three) times daily.    Marland Kitchen lamoTRIgine (LAMICTAL) 200 MG tablet Take 200 mg by mouth daily.    . metFORMIN (GLUCOPHAGE) 500 MG tablet Take 500 mg by mouth 2 (two) times daily with a meal.    . nystatin-triamcinolone (MYCOLOG II) cream Apply topically 2 (two) times daily. 30 g 0  . pantoprazole (PROTONIX) 40 MG tablet Take  40 mg by mouth daily.     No current facility-administered medications for this visit.     Allergies as of 07/12/2019 - Review Complete 06/23/2019  Allergen Reaction Noted  . Penicillins Hives 12/03/2018  . Sulfa antibiotics Hives 12/03/2018    Review of Systems:    All systems reviewed and negative except where noted in HPI.  General Appearance:    Alert, cooperative, no distress, appears stated age  Head:    Normocephalic, without obvious abnormality, atraumatic  Eyes:    PERRL, conjunctiva/corneas clear,  Ears:    Grossly normal hearing    Neurologic:  Grossly normal    Observations/Objective:  Labs: CMP     Component  Value Date/Time   NA 139 06/22/2019 0502   K 3.7 06/22/2019 0502   CL 109 06/22/2019 0502   CO2 22 06/22/2019 0502   GLUCOSE 100 (H) 06/22/2019 0502   BUN 8 06/22/2019 0502   CREATININE 0.69 06/22/2019 0502   CALCIUM 8.4 (L) 06/22/2019 0502   PROT 5.5 (L) 06/21/2019 1725   ALBUMIN 2.7 (L) 06/21/2019 1725   AST 41 06/21/2019 1725   ALT 25 06/21/2019 1725   ALKPHOS 76 06/21/2019 1725   BILITOT 0.9 06/21/2019 1725   GFRNONAA >60 06/22/2019 0502   GFRAA >60 06/22/2019 0502   Lab Results  Component Value Date   WBC 5.5 06/24/2019   HGB 7.2 (L) 06/24/2019   HCT 25.5 (L) 06/24/2019   MCV 74.3 (L) 06/24/2019   PLT 101 (L) 06/24/2019    Imaging Studies: Ct Abdomen Pelvis W Contrast  Result Date: 06/21/2019 CLINICAL DATA:  Fall, possible hernia, abdominal swelling and weight gain EXAM: CT ABDOMEN AND PELVIS WITH CONTRAST TECHNIQUE: Multidetector CT imaging of the abdomen and pelvis was performed using the standard protocol following bolus administration of intravenous contrast. CONTRAST:  1106mL OMNIPAQUE IOHEXOL 300 MG/ML SOLN, additional oral enteric contrast COMPARISON:  None. FINDINGS: Lower chest: No acute abnormality. Hepatobiliary: Coarse, nodular contour of the liver. Status post cholecystectomy. No biliary ductal dilatation. Pancreas: Unremarkable. No pancreatic ductal dilatation or surrounding inflammatory changes. Spleen: Splenomegaly, maximum coronal span 15.7 cm Adrenals/Urinary Tract: Adrenal glands are unremarkable. Kidneys are normal, without renal calculi, solid lesion, or hydronephrosis. Bladder is unremarkable. Stomach/Bowel: Stomach is within normal limits. Appendix appears normal. No evidence of bowel wall thickening, distention, or inflammatory changes. Vascular/Lymphatic: Aortic atherosclerosis. No enlarged abdominal or pelvic lymph nodes. Reproductive: Status post hysterectomy. Other: There is a small, broad-based fat containing midline ventral hernia measuring 5.7 x 3.2 x  2.2 cm (series 2, image 53, series 6, image 70). Small volume ascites. Musculoskeletal: No acute or significant osseous findings. Status post left hip total arthroplasty. IMPRESSION: 1.  Stigmata of cirrhosis, splenomegaly, and small volume ascites. 2. There is a small, broad-based fat containing midline ventral hernia measuring 5.7 x 3.2 x 2.2 cm (series 2, image 53, series 6, image 70). 3. Other chronic, incidental, and postoperative findings as detailed above. Electronically Signed   By: Eddie Candle M.D.   On: 06/21/2019 16:36    Assessment and Plan:   Margaret [name] is a 63 y.o. y/o female here today to follow-up for hospital discharge.  She was admitted with an incidental finding of severe anemia and was severely iron deficient.  She underwent EGD and colonoscopy which were negative.  No overt bleeding noted.  She had been on long-term NSAIDs.  Likely cause of her bleeding.  Also incidentally found to have cirrhosis of the liver with features of  portal hypertension on her CT scan of the abdomen. No esophageal varices on EGD.  No hepatic encephalopathy.  Plan 1.  Capsule study of the small bowel to complete work-up for iron deficiency anemia 2.  H. pylori breath test 3.  Hemoglobin is improved to 10.4 g.  Continue iron tablets. 4.  Perform viral hepatitis, autoimmune liver work-up, genetic disorders contributing to liver cirrhosis.  All labs will be ordered.  Obtain CMP and INR to calculate meld score. 5.  Right upper quadrant ultrasound to screen for [facility] in January 2021. 6.  EGD to screen for esophageal varices in 2023 7.  Low-sodium diet 8.  Avoid NSAIDs 9.  Diagnostic paracentesis   Risks, benefits, alternatives of Givens capsule discussed with patient to include but not limited to the rare risk of Given's capsule becoming lodged in the GI tract requiring surgical removal.  The patient agrees with this plan & consent will be obtained.     I discussed the assessment and treatment plan  with the patient. The patient was provided an opportunity to ask questions and all were answered. The patient agreed with the plan and demonstrated an understanding of the instructions.   The patient was advised to call back or seek an in-person evaluation if the symptoms worsen or if the condition fails to improve as anticipated.  Follow-up in 3 weeks  Dr Margaret Bellows MD,MRCP [facility]) Gastroenterology/Hepatology Pager: [phone]   Speech recognition software was used to dictate this note.

## 2019-07-14 ENCOUNTER — Telehealth: Payer: Self-pay

## 2019-07-14 NOTE — Telephone Encounter (Signed)
Called pt to discuss scheduling Capsule study and U/S paracentesis.   Unable to contact, LVM to return call

## 2019-07-17 ENCOUNTER — Other Ambulatory Visit: Payer: Self-pay

## 2019-07-17 DIAGNOSIS — K7460 Unspecified cirrhosis of liver: Secondary | ICD-10-CM

## 2019-07-17 DIAGNOSIS — D509 Iron deficiency anemia, unspecified: Secondary | ICD-10-CM

## 2019-07-17 MED ORDER — ALBUMIN HUMAN 25 % IV SOLN: 25.0000 g | Freq: Once | INTRAVENOUS | 0 refills | Status: AC

## 2019-07-18 ENCOUNTER — Other Ambulatory Visit: Payer: Self-pay

## 2019-07-18 NOTE — Telephone Encounter (Signed)
Pt capsule study was ordered and scheduled for 08-01-19. I verified date availability with [facility] End[facility]. Pt wants paracentesis scheduled on the week of the 12th in October. I spoke with radiology scheduling and was told they would contact pt to schedule approximately 2 weeks prior. Pt understands and agrees.

## 2019-07-19 ENCOUNTER — Encounter: Payer: Self-pay | Admitting: Oncology

## 2019-07-19 ENCOUNTER — Inpatient Hospital Stay: Payer: Medicaid Other

## 2019-07-19 ENCOUNTER — Inpatient Hospital Stay: Payer: Medicaid Other | Attending: Oncology | Admitting: Oncology

## 2019-07-19 ENCOUNTER — Other Ambulatory Visit: Payer: Self-pay

## 2019-07-19 VITALS — BP 140/87 | Ht 61.5 in | Wt 184.0 lb

## 2019-07-19 DIAGNOSIS — R161 Splenomegaly, not elsewhere classified: Secondary | ICD-10-CM | POA: Diagnosis not present

## 2019-07-19 DIAGNOSIS — R5383 Other fatigue: Secondary | ICD-10-CM | POA: Insufficient documentation

## 2019-07-19 DIAGNOSIS — K7460 Unspecified cirrhosis of liver: Secondary | ICD-10-CM | POA: Insufficient documentation

## 2019-07-19 DIAGNOSIS — D696 Thrombocytopenia, unspecified: Secondary | ICD-10-CM | POA: Insufficient documentation

## 2019-07-19 DIAGNOSIS — K7469 Other cirrhosis of liver: Secondary | ICD-10-CM | POA: Diagnosis not present

## 2019-07-19 DIAGNOSIS — D500 Iron deficiency anemia secondary to blood loss (chronic): Secondary | ICD-10-CM

## 2019-07-19 LAB — CBC WITH DIFFERENTIAL/PLATELET
Abs Immature Granulocytes: 0.03 10*3/uL (ref 0.00–0.07)
Basophils Absolute: 0 10*3/uL (ref 0.0–0.1)
Basophils Relative: 1 %
Eosinophils Absolute: 0.1 10*3/uL (ref 0.0–0.5)
Eosinophils Relative: 2 %
HCT: 36.1 % (ref 36.0–46.0)
Hemoglobin: 11 g/dL — ABNORMAL LOW (ref 12.0–15.0)
Immature Granulocytes: 1 %
Lymphocytes Relative: 25 %
Lymphs Abs: 1.5 10*3/uL (ref 0.7–4.0)
MCH: 24.8 pg — ABNORMAL LOW (ref 26.0–34.0)
MCHC: 30.5 g/dL (ref 30.0–36.0)
MCV: 81.5 fL (ref 80.0–100.0)
Monocytes Absolute: 0.6 10*3/uL (ref 0.1–1.0)
Monocytes Relative: 9 %
Neutro Abs: 3.8 10*3/uL (ref 1.7–7.7)
Neutrophils Relative %: 62 %
Platelets: 161 10*3/uL (ref 150–400)
RBC: 4.43 MIL/uL (ref 3.87–5.11)
WBC: 6.1 10*3/uL (ref 4.0–10.5)
nRBC: 0 % (ref 0.0–0.2)

## 2019-07-19 LAB — IRON AND TIBC
Iron: 211 ug/dL — ABNORMAL HIGH (ref 28–170)
Saturation Ratios: 41 % — ABNORMAL HIGH (ref 10.4–31.8)
TIBC: 516 ug/dL — ABNORMAL HIGH (ref 250–450)
UIBC: 305 ug/dL

## 2019-07-19 LAB — FERRITIN: Ferritin: 39 ng/mL (ref 11–307)

## 2019-07-19 NOTE — Progress Notes (Signed)
Patient is here today to establish care for anemia. 

## 2019-07-20 NOTE — Progress Notes (Signed)
Hematology/Oncology Consult note [facility] [phone] [phone]   Patient Care Team: Rusty Aus, MD as PCP - General (Internal Medicine)  REFERRING PROVIDER: Gladstone Lighter, MD CHIEF COMPLAINTS/REASON FOR VISIT:  Evaluation of iron deficiency anemia  HISTORY OF PRESENTING ILLNESS:  Margaret [name] is a  63 y.o.  female with PMH listed below was seen in consultation at the request of Gladstone Lighter, MD   for evaluation of iron deficiency anemia.  Patient was recently admitted from 06/21/2019-06/24/2019 due to severe anemia. She was found to have a hemoglobin of 5.7 upon admission. During the hospitalization, patient received 2 units of blood transfusion, 3 doses of IV iron. Discharged on oral iron supplementation. EGD and colonoscopy were performed during the hospitalization EGD and colonoscopy on 06/23/2019.  EGD showed no abnormalities and the colonoscopy showed 3 sessile diminutive polyps which were resected completely.  Arteries of the duodenum were normal.  3 colon polyps resected were serrated polyp x2 and a tubular adenoma x1. No active source of bleeding was noted.  Patient was scheduled for capsule endoscopy as outpatient. Today patient reports feeling well.  She has been taking oral iron supplementation.  Denies hematochezia, hematuria, hematemesis, epistaxis, black tarry stool or easy bruising.  Still feels some fatigue, not back to her baseline yet.  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  Review of Systems  Constitutional: Positive for fatigue. Negative for appetite change, chills and fever.  HENT:   Negative for hearing loss and voice change.   Eyes: Negative for eye problems.  Respiratory: Negative for chest tightness and cough.   Cardiovascular: Negative for chest pain.  Gastrointestinal:  Negative for abdominal distention, abdominal pain and blood in stool.  Endocrine: Negative for hot flashes.  Genitourinary: Negative for difficulty urinating and frequency.   Musculoskeletal: Negative for arthralgias.  Skin: Negative for itching and rash.  Neurological: Negative for extremity weakness.  Hematological: Negative for adenopathy.  Psychiatric/Behavioral: Negative for confusion.    MEDICAL HISTORY:  Past Medical History:  Diagnosis Date  . Bipolar 1 disorder ([place])   . Diabetes mellitus without complication (HCC)    Type 2  . Hypertension     SURGICAL HISTORY: Past Surgical History:  Procedure Laterality Date  . ABDOMINAL HYSTERECTOMY    . CHOLECYSTECTOMY    . COLONOSCOPY WITH PROPOFOL N/A 06/23/2019   Procedure: COLONOSCOPY WITH PROPOFOL;  Surgeon: Jonathon Bellows, MD;  Location: [facility] ENDOSCOPY;  Service: Gastroenterology;  Laterality: N/A;  . ESOPHAGOGASTRODUODENOSCOPY (EGD) WITH PROPOFOL N/A 06/23/2019   Procedure: ESOPHAGOGASTRODUODENOSCOPY (EGD) WITH PROPOFOL;  Surgeon: Jonathon Bellows, MD;  Location: [facility] ENDOSCOPY;  Service: Gastroenterology;  Laterality: N/A;  . TONSILLECTOMY      SOCIAL HISTORY: Social History   Socioeconomic History  . Marital status: Married    Spouse name: Not on file  . Number of children: Not on file  . Years of education: Not on file  . Highest education level: Not on file  Occupational History  . Not on file  Social Needs  . Financial resource strain: Not on file  . Food insecurity    Worry: Not on file    Inability: Not on file  . Transportation needs    Medical: Not on file    Non-medical: Not on file  Tobacco Use  . Smoking status: Former Smoker    Types: Cigarettes    Quit date: 03/03/2011  Years since quitting: 8.3  . Smokeless tobacco: Never Used  Substance and Sexual Activity  . Alcohol use: Not Currently  . Drug use: Not Currently  . Sexual activity: Not on file  Lifestyle  . Physical activity    Days per week:  Not on file    Minutes per session: Not on file  . Stress: Not on file  Relationships  . Social Herbalist on phone: Not on file    Gets together: Not on file    Attends religious service: Not on file    Active member of club or organization: Not on file    Attends meetings of clubs or organizations: Not on file    Relationship status: Not on file  . Intimate partner violence    Fear of current or ex partner: Not on file    Emotionally abused: Not on file    Physically abused: Not on file    Forced sexual activity: Not on file  Other Topics Concern  . Not on file  Social History Narrative  . Not on file    FAMILY HISTORY: History reviewed. No pertinent family history.  ALLERGIES:  is allergic to penicillins and sulfa antibiotics.  MEDICATIONS:  Current Outpatient Medications  Medication Sig Dispense Refill  . alendronate (FOSAMAX) 35 MG tablet Take 35 mg by mouth every 7 (seven) days. Take with a full glass of water on an empty stomach.    Marland Kitchen amLODipine (NORVASC) 10 MG tablet Take 10 mg by mouth daily.    . ARIPiprazole (ABILIFY) 2 MG tablet Take 2 mg by mouth daily.    . ARIPiprazole (ABILIFY) 5 MG tablet Take 5 mg by mouth daily.    . B Complex-C (B-COMPLEX WITH VITAMIN C) tablet Take 1 tablet by mouth daily. 30 tablet 2  . citalopram (CELEXA) 40 MG tablet Take 40 mg by mouth daily.    . Dulaglutide (TRULICITY) A999333 [id number] SOPN Inject 0.75 mg into the skin once a week. Thursday    . Ertugliflozin L-PyroglutamicAc (STEGLATRO) 15 MG TABS Take by mouth.    . ferrous sulfate 325 (65 FE) MG tablet Take 1 tablet (325 mg total) by mouth 2 (two) times daily with a meal. 120 tablet 0  . gabapentin (NEURONTIN) 600 MG tablet Take 600 mg by mouth 3 (three) times daily.    Marland Kitchen HYDROcodone-acetaminophen (NORCO) 10-325 MG tablet Take 1 tablet by mouth 3 (three) times daily.    Marland Kitchen lamoTRIgine (LAMICTAL) 200 MG tablet Take 200 mg by mouth daily.    . metFORMIN (GLUCOPHAGE) 500 MG  tablet Take 500 mg by mouth 2 (two) times daily with a meal.    . nystatin-triamcinolone (MYCOLOG II) cream Apply topically 2 (two) times daily. 30 g 0  . pantoprazole (PROTONIX) 40 MG tablet Take 40 mg by mouth daily.     No current facility-administered medications for this visit.      PHYSICAL EXAMINATION: ECOG PERFORMANCE STATUS: 1 - Symptomatic but completely ambulatory Vitals:   07/19/19 1443  BP: 140/87   Filed Weights   07/19/19 1443  Weight: 184 lb (83.5 kg)    Physical Exam Constitutional:      General: She is not in acute distress.    Appearance: She is obese.     Comments: Walks with walker.   HENT:     Head: Normocephalic and atraumatic.  Eyes:     General: No scleral icterus.    Pupils: Pupils are equal, round, and  reactive to light.  Neck:     Musculoskeletal: Normal range of motion and neck supple.  Cardiovascular:     Rate and Rhythm: Normal rate and regular rhythm.     Heart sounds: Normal heart sounds.  Pulmonary:     Effort: Pulmonary effort is normal. No respiratory distress.     Breath sounds: No wheezing.  Abdominal:     General: Bowel sounds are normal. There is no distension.     Palpations: Abdomen is soft. There is no mass.     Tenderness: There is no abdominal tenderness.  Musculoskeletal: Normal range of motion.        General: No deformity.  Skin:    General: Skin is warm and dry.     Findings: No erythema or rash.  Neurological:     Mental Status: She is alert and oriented to person, place, and time.     Cranial Nerves: No cranial nerve deficit.     Coordination: Coordination normal.  Psychiatric:        Behavior: Behavior normal.        Thought Content: Thought content normal.       CMP Latest Ref Rng & Units 06/22/2019  Glucose 70 - 99 mg/dL 100(H)  BUN 8 - 23 mg/dL 8  Creatinine 0.44 - 1.00 mg/dL 0.69  Sodium 135 - 145 mmol/L 139  Potassium 3.5 - 5.1 mmol/L 3.7  Chloride 98 - 111 mmol/L 109  CO2 22 - 32 mmol/L 22   Calcium 8.9 - 10.3 mg/dL 8.4(L)  Total Protein 6.5 - 8.1 g/dL -  Total Bilirubin 0.3 - 1.2 mg/dL -  Alkaline Phos 38 - 126 U/L -  AST 15 - 41 U/L -  ALT 0 - 44 U/L -   CBC Latest Ref Rng & Units 07/19/2019  WBC 4.0 - 10.5 K/uL 6.1  Hemoglobin 12.0 - 15.0 g/dL 11.0(L)  Hematocrit 36.0 - 46.0 % 36.1  Platelets 150 - 400 K/uL 161     LABORATORY DATA:  I have reviewed the data as listed Lab Results  Component Value Date   WBC 6.1 07/19/2019   HGB 11.0 (L) 07/19/2019   HCT 36.1 07/19/2019   MCV 81.5 07/19/2019   PLT 161 07/19/2019   Recent Labs    06/21/19 1622 06/21/19 1725 06/22/19 0502  NA  --  136 139  K  --  3.7 3.7  CL  --  107 109  CO2  --  22 22  GLUCOSE  --  111* 100*  BUN  --  10 8  CREATININE 0.70 0.65 0.69  CALCIUM  --  8.2* 8.4*  GFRNONAA  --  >60 >60  GFRAA  --  >60 >60  PROT  --  5.5*  --   ALBUMIN  --  2.7*  --   AST  --  41  --   ALT  --  25  --   ALKPHOS  --  76  --   BILITOT  --  0.9  --    Iron/TIBC/Ferritin/ %Sat    Component Value Date/Time   IRON 211 (H) 07/19/2019 1545   TIBC 516 (H) 07/19/2019 1545   FERRITIN 39 07/19/2019 1545   IRONPCTSAT 41 (H) 07/19/2019 1545     Ct Abdomen Pelvis W Contrast  Result Date: 06/21/2019 CLINICAL DATA:  Fall, possible hernia, abdominal swelling and weight gain EXAM: CT ABDOMEN AND PELVIS WITH CONTRAST TECHNIQUE: Multidetector CT imaging of the abdomen and pelvis was performed using  the standard protocol following bolus administration of intravenous contrast. CONTRAST:  158mL OMNIPAQUE IOHEXOL 300 MG/ML SOLN, additional oral enteric contrast COMPARISON:  None. FINDINGS: Lower chest: No acute abnormality. Hepatobiliary: Coarse, nodular contour of the liver. Status post cholecystectomy. No biliary ductal dilatation. Pancreas: Unremarkable. No pancreatic ductal dilatation or surrounding inflammatory changes. Spleen: Splenomegaly, maximum coronal span 15.7 cm Adrenals/Urinary Tract: Adrenal glands are  unremarkable. Kidneys are normal, without renal calculi, solid lesion, or hydronephrosis. Bladder is unremarkable. Stomach/Bowel: Stomach is within normal limits. Appendix appears normal. No evidence of bowel wall thickening, distention, or inflammatory changes. Vascular/Lymphatic: Aortic atherosclerosis. No enlarged abdominal or pelvic lymph nodes. Reproductive: Status post hysterectomy. Other: There is a small, broad-based fat containing midline ventral hernia measuring 5.7 x 3.2 x 2.2 cm (series 2, image 53, series 6, image 70). Small volume ascites. Musculoskeletal: No acute or significant osseous findings. Status post left hip total arthroplasty. IMPRESSION: 1.  Stigmata of cirrhosis, splenomegaly, and small volume ascites. 2. There is a small, broad-based fat containing midline ventral hernia measuring 5.7 x 3.2 x 2.2 cm (series 2, image 53, series 6, image 70). 3. Other chronic, incidental, and postoperative findings as detailed above. Electronically Signed   By: Eddie Candle M.D.   On: 06/21/2019 16:36      ASSESSMENT & PLAN:  1. Iron deficiency anemia due to chronic blood loss   2. Thrombocytopenia (HCC)   3. Splenomegaly   4. Other cirrhosis of liver ([place])   Previous labs were reviewed.  Consistent with iron deficiency anemia. Patient has received 2 units of PRBC transfusions and 3 doses of IV Venofer. Recommend to repeat CBC, iron, TIBC ferritin to decide if she will need any additional IV iron.  Labs are reviewed, hemoglobin has improved to 11.  MCV 81.5.  Iron panel showed improved ferritin to 39, TIBC 516.  Recommend patient to continue oral iron supplementation. Hold additional IV iron for now. Continue follow-up with gastroenterology to complete the small bowel capsule study.  Thrombocytopenia, likely secondary to splenomegaly due to cirrhosis. Platelet count has improved comparing to her counts during hospitalization.  Continue monitor.  Liver cirrhosis continue follow-up with  gastroenterology.   Orders Placed This Encounter  Procedures  . CBC with Differential/Platelet  . Ferritin  . Iron and TIBC    All questions were answered. The patient knows to call the clinic with any problems questions or concerns.  Cc Gladstone Lighter, MD  Return of visit:  3 months.  Thank you for this kind referral and the opportunity to participate in the care of this patient. A copy of today's note is routed to referring provider  Total face to face encounter time for this patient visit was 53min. >50% of the time was  spent in counseling and coordination of care.    Earlie Server, MD, PhD Hematology Oncology [facility] Pager- [phone] 07/20/2019

## 2019-07-21 ENCOUNTER — Telehealth: Payer: Self-pay

## 2019-07-21 NOTE — Telephone Encounter (Signed)
Patient informed of results.  Will get apps scheduled and let her know.

## 2019-07-21 NOTE — Telephone Encounter (Signed)
-----   Message from Earlie Server, MD sent at 07/20/2019  9:53 PM EDT ----- Please let patient know that her blood count has improved a lot and no need for IV iron for now.  Recommend her to continue taking oral iron supplementation.  Please schedule her to have Labs- [ordered], in 3 months, 1-2 days prior to MD and +/- Venofer Thanks.

## 2019-07-24 ENCOUNTER — Ambulatory Visit: Payer: Medicaid Other

## 2019-07-26 ENCOUNTER — Telehealth: Payer: Self-pay | Admitting: Gastroenterology

## 2019-07-26 NOTE — Telephone Encounter (Signed)
Larena Glassman called to let us know the patient called to Endo & stated she is schedule for a capsule study on 08-01-19. She states she does not have instruction. Please call

## 2019-07-26 NOTE — Telephone Encounter (Signed)
Spoke with pt regarding capsule study instructions. Pt states she could not find the instructions on Mychart. I then explained to pt where to locate the instruction letter. Pt was able to locate while we were speaking.

## 2019-07-31 ENCOUNTER — Telehealth: Payer: Self-pay

## 2019-07-31 NOTE — Telephone Encounter (Signed)
Spoke with pt and informed her that we will need to reschedule her Capsule study to a later date as we are still waiting for the prior auth approval from her insurance. Pt understands. We have moved pt capsule study to 08-22-19. [facility] has been notified.

## 2019-08-01 ENCOUNTER — Ambulatory Visit: Admit: 2019-08-01 | Payer: Medicaid Other | Admitting: Gastroenterology

## 2019-08-01 SURGERY — IMAGING PROCEDURE, GI TRACT, INTRALUMINAL, VIA CAPSULE
Anesthesia: General

## 2019-08-10 ENCOUNTER — Telehealth: Payer: Self-pay

## 2019-08-10 NOTE — Telephone Encounter (Signed)
Called pt to inquire if she is ready to schedule her paracentesis appointment.  Unable to contact, LVM to return call

## 2019-08-15 ENCOUNTER — Ambulatory Visit: Payer: Medicaid Other | Admitting: Gastroenterology

## 2019-08-21 ENCOUNTER — Encounter: Payer: Self-pay | Admitting: Psychiatry

## 2019-08-21 ENCOUNTER — Ambulatory Visit (INDEPENDENT_AMBULATORY_CARE_PROVIDER_SITE_OTHER): Payer: Medicaid Other | Admitting: Psychiatry

## 2019-08-21 ENCOUNTER — Other Ambulatory Visit: Payer: Self-pay

## 2019-08-21 DIAGNOSIS — G4700 Insomnia, unspecified: Secondary | ICD-10-CM | POA: Insufficient documentation

## 2019-08-21 DIAGNOSIS — Z9189 Other specified personal risk factors, not elsewhere classified: Secondary | ICD-10-CM | POA: Insufficient documentation

## 2019-08-21 DIAGNOSIS — F3174 Bipolar disorder, in full remission, most recent episode manic: Secondary | ICD-10-CM | POA: Insufficient documentation

## 2019-08-21 DIAGNOSIS — F3110 Bipolar disorder, current episode manic without psychotic features, unspecified: Secondary | ICD-10-CM

## 2019-08-21 MED ORDER — LAMOTRIGINE 200 MG PO TABS: 200.0000 mg | ORAL_TABLET | Freq: Every day | ORAL | 0 refills | Status: DC

## 2019-08-21 MED ORDER — GABAPENTIN 600 MG PO TABS: 600.0000 mg | ORAL_TABLET | Freq: Three times a day (TID) | ORAL | 0 refills | Status: DC

## 2019-08-21 MED ORDER — BELSOMRA 10 MG PO TABS: 10.0000 mg | ORAL_TABLET | Freq: Every evening | ORAL | 1 refills | Status: DC | PRN

## 2019-08-21 MED ORDER — CITALOPRAM HYDROBROMIDE 40 MG PO TABS: 40.0000 mg | ORAL_TABLET | Freq: Every day | ORAL | 0 refills | Status: DC

## 2019-08-21 MED ORDER — ARIPIPRAZOLE 10 MG PO TABS: 10.0000 mg | ORAL_TABLET | Freq: Every day | ORAL | 0 refills | Status: DC
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
## 2019-08-22 ENCOUNTER — Telehealth: Payer: Self-pay

## 2019-08-22 ENCOUNTER — Encounter: Admission: RE | Payer: Self-pay | Source: Home / Self Care

## 2019-08-22 ENCOUNTER — Ambulatory Visit: Admission: RE | Admit: 2019-08-22 | Payer: Medicaid Other | Source: Home / Self Care | Admitting: Gastroenterology

## 2019-08-22 DIAGNOSIS — G4700 Insomnia, unspecified: Secondary | ICD-10-CM

## 2019-08-22 SURGERY — IMAGING PROCEDURE, GI TRACT, INTRALUMINAL, VIA CAPSULE

## 2019-08-22 MED ORDER — ZOLPIDEM TARTRATE 5 MG PO TABS: 5.0000 mg | ORAL_TABLET | Freq: Every evening | ORAL | 0 refills | Status: DC | PRN

## 2019-08-22 NOTE — Telephone Encounter (Signed)
received fax that belsomra is not covered by patient insurance. pt has medicaid.

## 2019-08-22 NOTE — Telephone Encounter (Signed)
Called patient .Discussed Belsomra not covered by plan. Will start Ambien.

## 2019-09-19 ENCOUNTER — Other Ambulatory Visit: Payer: Self-pay

## 2019-09-19 ENCOUNTER — Ambulatory Visit (INDEPENDENT_AMBULATORY_CARE_PROVIDER_SITE_OTHER): Payer: Medicaid Other | Admitting: Psychiatry

## 2019-09-19 ENCOUNTER — Encounter: Payer: Self-pay | Admitting: Psychiatry

## 2019-09-19 DIAGNOSIS — F3110 Bipolar disorder, current episode manic without psychotic features, unspecified: Secondary | ICD-10-CM

## 2019-09-19 DIAGNOSIS — G4700 Insomnia, unspecified: Secondary | ICD-10-CM | POA: Diagnosis not present

## 2019-09-19 NOTE — Progress Notes (Signed)
Virtual Visit via Video Note  I connected with Margaret [name] on 09/19/19 at 11:30 AM EST by a video enabled telemedicine application and verified that I am speaking with the correct person using two identifiers.   I discussed the limitations of evaluation and management by telemedicine and the availability of in person appointments. The patient expressed understanding and agreed to proceed.     I discussed the assessment and treatment plan with the patient. The patient was provided an opportunity to ask questions and all were answered. The patient agreed with the plan and demonstrated an understanding of the instructions.   The patient was advised to call back or seek an in-person evaluation if the symptoms worsen or if the condition fails to improve as anticipated.   [facility] MD OP Progress Note  09/19/2019 12:57 PM Pricie Mclerran  MRN:  [id number]  Chief Complaint:  Chief Complaint    Follow-up     HPI: Margaret [name] is a 63 year old Caucasian female, on disability, lives in [place], divorced, has a history of bipolar disorder, hyperlipidemia, diabetes melitis, insomnia, chronic pain, hypertension was evaluated by telemedicine today.  Patient today reports she is currently struggling with right-sided chest pain.  She was seen in the emergency department recently.  She had several work-up done .  She currently takes Tylenol as needed and the pain is better.  Patient reports she is compliant on the higher dose of Abilify.  Her mood symptoms are better.  She however continues to struggle with sleep.  She was unable to get Belsomra due to cost.  Ambien was sent to the pharmacy [facility] however she has not picked up the prescription yet.  She however agrees to pick it up.  Patient denies any suicidality, homicidality or perceptual disturbances.  Reviewed and discussed EKG done on 09/15/2019.  Patient denies any other concerns today. Visit Diagnosis:    ICD-10-CM   1. Bipolar I disorder,  most recent episode (or current) manic ([place])  F31.10   2. Insomnia, unspecified type  G47.00     Past Psychiatric History: I have reviewed past psychiatric history from my progress note on 08/21/2019.  Past trials of Depakote, lithium, risperidone, Xanax  Past Medical History:  Past Medical History:  Diagnosis Date  . Bipolar 1 disorder ([place])   . Diabetes mellitus without complication (HCC)    Type 2  . Hypertension     Past Surgical History:  Procedure Laterality Date  . ABDOMINAL HYSTERECTOMY    . CHOLECYSTECTOMY    . COLONOSCOPY WITH PROPOFOL N/A 06/23/2019   Procedure: COLONOSCOPY WITH PROPOFOL;  Surgeon: Jonathon Bellows, MD;  Location: [facility] ENDOSCOPY;  Service: Gastroenterology;  Laterality: N/A;  . ESOPHAGOGASTRODUODENOSCOPY (EGD) WITH PROPOFOL N/A 06/23/2019   Procedure: ESOPHAGOGASTRODUODENOSCOPY (EGD) WITH PROPOFOL;  Surgeon: Jonathon Bellows, MD;  Location: [facility] ENDOSCOPY;  Service: Gastroenterology;  Laterality: N/A;  . TONSILLECTOMY      Family Psychiatric History: Reviewed family psychiatric history from my progress note on 08/21/2019  Family History:  Family History  Problem Relation Age of Onset  . Mental illness Father   . Bipolar disorder Brother   . Bipolar disorder Paternal Uncle   . Bipolar disorder Paternal Uncle     Social History: Reviewed social history from my progress note on 08/21/2019 Social History   Socioeconomic History  . Marital status: Divorced    Spouse name: Not on file  . Number of children: 2  . Years of education: Not on file  . Highest education level: Not  on file  Occupational History  . Not on file  Social Needs  . Financial resource strain: Not on file  . Food insecurity    Worry: Not on file    Inability: Not on file  . Transportation needs    Medical: Not on file    Non-medical: Not on file  Tobacco Use  . Smoking status: Former Smoker    Types: Cigarettes    Quit date: 03/03/2011    Years since quitting: 8.5  . Smokeless  tobacco: Never Used  Substance and Sexual Activity  . Alcohol use: Not Currently  . Drug use: Not Currently  . Sexual activity: Not on file  Lifestyle  . Physical activity    Days per week: Not on file    Minutes per session: Not on file  . Stress: Not on file  Relationships  . Social Herbalist on phone: Not on file    Gets together: Not on file    Attends religious service: Not on file    Active member of club or organization: Not on file    Attends meetings of clubs or organizations: Not on file    Relationship status: Not on file  Other Topics Concern  . Not on file  Social History Narrative  . Not on file    Allergies:  Allergies  Allergen Reactions  . Penicillins Hives    Resulted in hospitalization  . Sulfa Antibiotics Hives    Resulted in hospitalization    Metabolic Disorder Labs: Lab Results  Component Value Date   HGBA1C 7.3 (H) 06/22/2019   MPG 162.81 06/22/2019   No results found for: PROLACTIN No results found for: CHOL, TRIG, HDL, CHOLHDL, VLDL, LDLCALC No results found for: TSH  Therapeutic Level Labs: No results found for: LITHIUM No results found for: VALPROATE No components found for:  CBMZ  Current Medications: Current Outpatient Medications  Medication Sig Dispense Refill  . acetaminophen (TYLENOL) 500 MG tablet Take by mouth.    Marland Kitchen alendronate (FOSAMAX) 35 MG tablet Take 35 mg by mouth every 7 (seven) days. Take with a full glass of water on an empty stomach.    Marland Kitchen amLODipine (NORVASC) 10 MG tablet Take 10 mg by mouth daily.    . ARIPiprazole (ABILIFY) 10 MG tablet Take 1 tablet (10 mg total) by mouth daily. 90 tablet 0  . B Complex Vitamins (VITAMIN B-COMPLEX) TABS Take by mouth.    . B Complex-C (B-COMPLEX WITH VITAMIN C) tablet Take 1 tablet by mouth daily. 30 tablet 2  . cetirizine (ZYRTEC) 10 MG tablet Take 10 mg by mouth daily.    . citalopram (CELEXA) 40 MG tablet Take 1 tablet (40 mg total) by mouth daily. 90 tablet 0  .  Dulaglutide (TRULICITY) A999333 0000000 SOPN Inject 0.75 mg into the skin once a week. Thursday    . Ertugliflozin L-PyroglutamicAc (STEGLATRO) 15 MG TABS Take by mouth.    . fluconazole (DIFLUCAN) 100 MG tablet Take 100 mg by mouth daily.    Marland Kitchen gabapentin (NEURONTIN) 600 MG tablet Take 1 tablet (600 mg total) by mouth 3 (three) times daily. 270 tablet 0  . glimepiride (AMARYL) 2 MG tablet Take by mouth.    . hydrochlorothiazide (HYDRODIURIL) 25 MG tablet Take by mouth.    Marland Kitchen HYDROcodone-acetaminophen (NORCO) 10-325 MG tablet Take 1 tablet by mouth 3 (three) times daily.    . Insulin Glargine (LANTUS SOLOSTAR) 100 UNIT/ML Solostar Pen Inject into the skin.    Marland Kitchen  lamoTRIgine (LAMICTAL) 200 MG tablet Take 1 tablet (200 mg total) by mouth daily. 90 tablet 0  . LANTUS SOLOSTAR 100 UNIT/ML Solostar Pen SMARTSIG:60 Unit(s) SUB-Q Every Night    . lisinopril (ZESTRIL) 20 MG tablet Take 20 mg by mouth daily.    . meclizine (ANTIVERT) 25 MG tablet Take 25 mg by mouth 2 (two) times daily as needed.    . metFORMIN (GLUCOPHAGE) 500 MG tablet Take 500 mg by mouth 2 (two) times daily with a meal.    . MOVANTIK 25 MG TABS tablet Take 25 mg by mouth daily.    Marland Kitchen NOVOFINE 32G X 6 MM MISC USE TWICE DAILY ASADIRECTEDO    . olmesartan (BENICAR) 20 MG tablet Take by mouth.    . pantoprazole (PROTONIX) 40 MG tablet Take 40 mg by mouth daily.    . ferrous sulfate 325 (65 FE) MG tablet Take 1 tablet (325 mg total) by mouth 2 (two) times daily with a meal. 120 tablet 0  . metFORMIN (GLUCOPHAGE) 1000 MG tablet Take 1,000 mg by mouth 2 (two) times daily.    Marland Kitchen nystatin-triamcinolone (MYCOLOG II) cream Apply topically 2 (two) times daily. (Patient not taking: Reported on 09/19/2019) 30 g 0  . VICTOZA 18 MG/3ML SOPN Inject 1.2 mg into the skin daily.    Marland Kitchen zolpidem (AMBIEN) 5 MG tablet Take 1 tablet (5 mg total) by mouth at bedtime as needed for sleep. (Patient not taking: Reported on 09/19/2019) 30 tablet 0   No current  facility-administered medications for this visit.      Musculoskeletal: Strength & Muscle Tone: UTA Gait & Station: normal Patient leans: N/A  Psychiatric Specialty Exam: Review of Systems  Cardiovascular: Positive for chest pain.  Psychiatric/Behavioral: The patient has insomnia.   All other systems reviewed and are negative.   There were no vitals taken for this visit.There is no height or weight on file to calculate BMI.  General Appearance: Casual  Eye Contact:  Fair  Speech:  Normal Rate  Volume:  Normal  Mood:  Euthymic  Affect:  Congruent  Thought Process:  Goal Directed and Descriptions of Associations: Intact  Orientation:  Full (Time, Place, and Person)  Thought Content: Logical   Suicidal Thoughts:  No  Homicidal Thoughts:  No  Memory:  Immediate;   Fair Recent;   Fair Remote;   Fair  Judgement:  Fair  Insight:  Fair  Psychomotor Activity:  Normal  Concentration:  Concentration: Fair and Attention Span: Fair  Recall:  [employer] of Knowledge: Fair  Language: Fair  Akathisia:  No  Handed:  Right  AIMS (if indicated): Denies tremors, rigidity, stiffness  Assets:  Communication Skills Desire for Improvement Social Support  ADL's:  Intact  Cognition: WNL  Sleep:  Poor   Screenings:   Assessment and Plan: Margaret [name] is a 64 year old Caucasian female, on disability, divorced, lives in [place], has a history of bipolar disorder, chronic pain, diabetes melitis, hypertension, hyperlipidemia was evaluated by telemedicine today.  Patient is biologically predisposed given her family history as well as history of trauma.  Patient with psychosocial stressors of the pandemic as well as her own recent health issues.  Patient will continue to benefit from medication readjustment since she continues to struggle with sleep.  Plan Bipolar disorder type I manic-improving Abilify 10 mg p.o. daily Lamictal 200 mg p.o. daily Gabapentin 600 mg p.o. 3 times daily Celexa 40 mg  p.o. daily  Insomnia unspecified-likely multifactorial Start Ambien 5 to 10  mg p.o. nightly as needed.  Patient advised to pick the prescription from the pharmacy.  Reviewed EKG dated 09/15/2019-QTC within normal limits.  Follow-up in clinic in 1 month or sooner if needed.  December 17 at 10:40 AM  I have spent atleast 15 minutes non face to face with patient today. More than 50 % of the time was spent for psychoeducation and supportive psychotherapy and care coordination. This note was generated in part or whole with voice recognition software. Voice recognition is usually quite accurate but there are transcription errors that can and very often do occur. I apologize for any typographical errors that were not detected and corrected.       Ursula Alert, MD 09/19/2019, 12:57 PM

## 2019-10-11 ENCOUNTER — Telehealth: Payer: Self-pay

## 2019-10-11 DIAGNOSIS — F3110 Bipolar disorder, current episode manic without psychotic features, unspecified: Secondary | ICD-10-CM

## 2019-10-11 MED ORDER — ARIPIPRAZOLE 15 MG PO TABS: 15.0000 mg | ORAL_TABLET | Freq: Every day | ORAL | 0 refills | Status: DC

## 2019-10-11 NOTE — Telephone Encounter (Signed)
pt been sick on her stomach and she has been crying alot. and she thinks it is the medication causing all of this.  pt states she not going to take anything until she know what medication she needs to take.

## 2019-10-11 NOTE — Telephone Encounter (Signed)
Returned call to patient - she is having mood lability, crying spells . Increase Abilify 15 mg daily. Pt has appointment next week.

## 2019-10-16 ENCOUNTER — Telehealth: Payer: Self-pay

## 2019-10-16 ENCOUNTER — Inpatient Hospital Stay: Payer: Medicaid Other

## 2019-10-16 DIAGNOSIS — G4700 Insomnia, unspecified: Secondary | ICD-10-CM

## 2019-10-16 DIAGNOSIS — F3110 Bipolar disorder, current episode manic without psychotic features, unspecified: Secondary | ICD-10-CM

## 2019-10-16 MED ORDER — ARIPIPRAZOLE 5 MG PO TABS: 5.0000 mg | ORAL_TABLET | Freq: Every day | ORAL | 1 refills | Status: DC

## 2019-10-16 MED ORDER — HYDROXYZINE HCL 25 MG PO TABS: 25.0000 mg | ORAL_TABLET | Freq: Three times a day (TID) | ORAL | 0 refills | Status: DC | PRN

## 2019-10-16 NOTE — Telephone Encounter (Signed)
pt called left a message that she is having medication issues. she feels like she is about to loose her mind.

## 2019-10-16 NOTE — Telephone Encounter (Signed)
pt called back pt states that the pharmacy found rx

## 2019-10-16 NOTE — Telephone Encounter (Signed)
pt states she is having alot of anxiety and she needs something for her nervousness. she states her heart feels like it is racing

## 2019-10-16 NOTE — Telephone Encounter (Signed)
Pt called states she call pharmacy and they state that they dont have rx for hydroxyzine.    hydrOXYzine (ATARAX/VISTARIL) 25 MG tablet Medication Date: 10/16/2019 Department: [place] Ordering/Authorizing: Ursula Alert, MD  Order Providers  Prescribing Provider Encounter Provider  Ursula Alert, MD Carlynn Purl, CMA  Outpatient Medication Detail   Disp Refills Start End   hydrOXYzine (ATARAX/VISTARIL) 25 MG tablet 90 tablet 0 10/16/2019    Sig - Route: Take 1 tablet (25 mg total) by mouth 3 (three) times daily as needed for anxiety. - Oral   Sent to pharmacy as: hydrOXYzine (ATARAX/VISTARIL) 25 MG tablet   E-Prescribing Status: Receipt confirmed by pharmacy (10/16/2019 12:39 PM EST)   Associated Diagnoses  Bipolar I disorder, most recent episode (or current) manic ([place]) - [place], [address]

## 2019-10-16 NOTE — Telephone Encounter (Signed)
Thanks for letting me know!

## 2019-10-16 NOTE — Telephone Encounter (Signed)
Returned call to patient - she is anxious , has racing heart rate. Will reduce Abilify to 5 mg  Will add Vistaril 25 mg tid prn. Discussed to go to PMD for evaluation of her racing heart rate.

## 2019-10-17 ENCOUNTER — Inpatient Hospital Stay: Payer: Medicaid Other | Admitting: Oncology

## 2019-10-17 ENCOUNTER — Inpatient Hospital Stay: Payer: Medicaid Other

## 2019-10-19 ENCOUNTER — Encounter: Payer: Self-pay | Admitting: Psychiatry

## 2019-10-19 ENCOUNTER — Other Ambulatory Visit: Payer: Self-pay

## 2019-10-19 ENCOUNTER — Ambulatory Visit (INDEPENDENT_AMBULATORY_CARE_PROVIDER_SITE_OTHER): Payer: Medicaid Other | Admitting: Psychiatry

## 2019-10-19 DIAGNOSIS — F3110 Bipolar disorder, current episode manic without psychotic features, unspecified: Secondary | ICD-10-CM | POA: Diagnosis not present

## 2019-10-19 DIAGNOSIS — F5105 Insomnia due to other mental disorder: Secondary | ICD-10-CM

## 2019-10-19 NOTE — Progress Notes (Signed)
Virtual Visit via Video Note  I connected with Margaret [name] on 10/19/19 at 10:40 AM EST by a video enabled telemedicine application and verified that I am speaking with the correct person using two identifiers.   I discussed the limitations of evaluation and management by telemedicine and the availability of in person appointments. The patient expressed understanding and agreed to proceed.      I discussed the assessment and treatment plan with the patient. The patient was provided an opportunity to ask questions and all were answered. The patient agreed with the plan and demonstrated an understanding of the instructions.   The patient was advised to call back or seek an in-person evaluation if the symptoms worsen or if the condition fails to improve as anticipated.   [place] MD OP Progress Note  10/19/2019 10:56 AM Margaret [name]  MRN:  [id number]  Chief Complaint:  Chief Complaint    Follow-up     HPI: Margaret [name] is a 63 year old Caucasian female, on disability, lives in [place], divorced, has a history of bipolar disorder, hyperlipidemia, insomnia, diabetes melitis, chronic pain, hypertension was evaluated by telemedicine today.  Patient today reports she was able to take a reduced dosage of Abilify and started taking the hydroxyzine as recommended few days ago.  She reports ever since making that changes with her dosages she feels much better.  She reports she is not as anxious as she was previously.  She also does not feel like she has a GI side effects at this time.  She is not nauseous and denies any abdominal pain at this time.  She however reports the hydroxyzine 25 mg made her dizzy.  Even though it helped with her symptoms she could not for tolerate the dizziness.  Advised patient to take 1/2 tablet to see if that will help her better without the side effects of dizziness.  Patient agrees to give it a try and Biochemist, clinical know.  She reports she could not fill the Ambien due to the cost.   She reports she is currently taking melatonin and that does help with her sleep.  She wants to stay on it for now.  Patient has psychosocial stressors of her mother who is currently struggling with health problems.  Patient reports she is taking one day at a time.  She denies any suicidality, homicidality or perceptual disturbances.  Patient denies any other concerns today. Visit Diagnosis:    ICD-10-CM   1. Bipolar I disorder, most recent episode (or current) manic ([place])  F31.10   2. Insomnia due to mental condition  F51.05     Past Psychiatric History: I have reviewed past psychiatric history from my progress note on 08/21/2019.  Past trials of Depakote, lithium, risperidone, Xanax  Past Medical History:  Past Medical History:  Diagnosis Date  . Bipolar 1 disorder ([place])   . Diabetes mellitus without complication ([facility])    Type 2  . Hypertension     Past Surgical History:  Procedure Laterality Date  . ABDOMINAL HYSTERECTOMY    . CHOLECYSTECTOMY    . COLONOSCOPY WITH PROPOFOL N/A 06/23/2019   Procedure: COLONOSCOPY WITH PROPOFOL;  Surgeon: Jonathon Bellows, MD;  Location[facility] ENDOSCOPY;  Service: Gastroenterology;  Laterality: N/A;  . ESOPHAGOGASTRODUODENOSCOPY (EGD) WITH PROPOFOL N/A 06/23/2019   Procedure: ESOPHAGOGASTRODUODENOSCOPY (EGD) WITH PROPOFOL;  Surgeon: Jonathon Bellows, MD;  Location: [facility] ENDOSCOPY;  Service: Gastroenterology;  Laterality: N/A;  . TONSILLECTOMY      Family Psychiatric History: I have reviewed family psychiatric history  from my progress note on 08/21/2019.  Family History:  Family History  Problem Relation Age of Onset  . Mental illness Father   . Bipolar disorder Brother   . Bipolar disorder Paternal Uncle   . Bipolar disorder Paternal Uncle     Social History: Reviewed social history from my progress note on 08/21/2019. Social History   Socioeconomic History  . Marital status: Divorced    Spouse name: Not on file  . Number of children: 2  .  Years of education: Not on file  . Highest education level: Not on file  Occupational History  . Not on file  Tobacco Use  . Smoking status: Former Smoker    Types: Cigarettes    Quit date: 03/03/2011    Years since quitting: 8.6  . Smokeless tobacco: Never Used  Substance and Sexual Activity  . Alcohol use: Not Currently  . Drug use: Not Currently  . Sexual activity: Not on file  Other Topics Concern  . Not on file  Social History Narrative  . Not on file   Social Determinants of Health   Financial Resource Strain:   . Difficulty of Paying Living Expenses: Not on file  Food Insecurity:   . Worried About Charity fundraiser in the Last Year: Not on file  . Ran Out of Food in the Last Year: Not on file  Transportation Needs:   . Lack of Transportation (Medical): Not on file  . Lack of Transportation (Non-Medical): Not on file  Physical Activity:   . Days of Exercise per Week: Not on file  . Minutes of Exercise per Session: Not on file  Stress:   . Feeling of Stress : Not on file  Social Connections:   . Frequency of Communication with Friends and Family: Not on file  . Frequency of Social Gatherings with Friends and Family: Not on file  . Attends Religious Services: Not on file  . Active Member of Clubs or Organizations: Not on file  . Attends Archivist Meetings: Not on file  . Marital Status: Not on file    Allergies:  Allergies  Allergen Reactions  . Penicillins Hives    Resulted in hospitalization  . Sulfa Antibiotics Hives    Resulted in hospitalization    Metabolic Disorder Labs: Lab Results  Component Value Date   HGBA1C 7.3 (H) 06/22/2019   MPG 162.81 06/22/2019   No results found for: PROLACTIN No results found for: CHOL, TRIG, HDL, CHOLHDL, VLDL, LDLCALC No results found for: TSH  Therapeutic Level Labs: No results found for: LITHIUM No results found for: VALPROATE No components found for:  CBMZ  Current Medications: Current  Outpatient Medications  Medication Sig Dispense Refill  . acetaminophen (TYLENOL) 500 MG tablet Take by mouth.    [place] Kitchen alendronate (FOSAMAX) 35 MG tablet Take 35 mg by mouth every 7 (seven) days. Take with a full glass of water on an empty stomach.    [place] Kitchen amLODipine (NORVASC) 10 MG tablet Take 10 mg by mouth daily.    . ARIPiprazole (ABILIFY) 5 MG tablet Take 1 tablet (5 mg total) by mouth daily. 30 tablet 1  . B Complex Vitamins (VITAMIN B-COMPLEX) TABS Take by mouth.    . B Complex-C (B-COMPLEX WITH VITAMIN C) tablet Take 1 tablet by mouth daily. 30 tablet 2  . cetirizine (ZYRTEC) 10 MG tablet Take 10 mg by mouth daily.    . citalopram (CELEXA) 40 MG tablet Take 1 tablet (40 mg  total) by mouth daily. 90 tablet 0  . Dulaglutide (TRULICITY) [id number] 0000000 SOPN Inject 0.75 mg into the skin once a week. Thursday    . Ertugliflozin L-PyroglutamicAc (STEGLATRO) 15 MG TABS Take by mouth.    . ferrous sulfate 325 (65 FE) MG tablet Take 1 tablet (325 mg total) by mouth 2 (two) times daily with a meal. 120 tablet 0  . fluconazole (DIFLUCAN) 100 MG tablet Take 100 mg by mouth daily.    [place] Kitchen gabapentin (NEURONTIN) 600 MG tablet Take 1 tablet (600 mg total) by mouth 3 (three) times daily. 270 tablet 0  . glimepiride (AMARYL) 2 MG tablet Take by mouth.    . hydrochlorothiazide (HYDRODIURIL) 25 MG tablet Take by mouth.    [place] Kitchen HYDROcodone-acetaminophen (NORCO) 10-325 MG tablet Take 1 tablet by mouth 3 (three) times daily.    . hydrOXYzine (ATARAX/VISTARIL) 25 MG tablet Take 1 tablet (25 mg total) by mouth 3 (three) times daily as needed for anxiety. 90 tablet 0  . Insulin Glargine (LANTUS SOLOSTAR) 100 UNIT/ML Solostar Pen Inject into the skin.    [place] Kitchen lamoTRIgine (LAMICTAL) 200 MG tablet Take 1 tablet (200 mg total) by mouth daily. 90 tablet 0  . LANTUS SOLOSTAR 100 UNIT/ML Solostar Pen SMARTSIG:60 Unit(s) SUB-Q Every Night    . lisinopril (ZESTRIL) 20 MG tablet Take 20 mg by mouth daily.    . meclizine (ANTIVERT) 25 MG  tablet Take 25 mg by mouth 2 (two) times daily as needed.    . metFORMIN (GLUCOPHAGE) 1000 MG tablet Take 1,000 mg by mouth 2 (two) times daily.    . metFORMIN (GLUCOPHAGE) 500 MG tablet Take 500 mg by mouth 2 (two) times daily with a meal.    . MOVANTIK 25 MG TABS tablet Take 25 mg by mouth daily.    [place] Kitchen NOVOFINE 32G X 6 MM MISC USE TWICE DAILY ASADIRECTEDO    . nystatin-triamcinolone (MYCOLOG II) cream Apply topically 2 (two) times daily. (Patient not taking: Reported on 09/19/2019) 30 g 0  . olmesartan (BENICAR) 20 MG tablet Take by mouth.    . pantoprazole (PROTONIX) 40 MG tablet Take 40 mg by mouth daily.    [place] Kitchen VICTOZA 18 MG/3ML SOPN Inject 1.2 mg into the skin daily.     No current facility-administered medications for this visit.     Musculoskeletal: Strength & Muscle Tone: UTA Gait & Station: normal Patient leans: N/A  Psychiatric Specialty Exam: Review of Systems  Neurological: Positive for dizziness (improving).  Psychiatric/Behavioral: Positive for sleep disturbance. The patient is nervous/anxious.   All other systems reviewed and are negative.   There were no vitals taken for this visit.There is no height or weight on file to calculate BMI.  General Appearance: Casual  Eye Contact:  Fair  Speech:  Clear and Coherent  Volume:  Normal  Mood:  Anxious  Affect:  Congruent  Thought Process:  Goal Directed and Descriptions of Associations: Intact  Orientation:  Full (Time, Place, and Person)  Thought Content: Logical   Suicidal Thoughts:  No  Homicidal Thoughts:  No  Memory:  Immediate;   Fair Recent;   Fair Remote;   Fair  Judgement:  Fair  Insight:  Fair  Psychomotor Activity:  Normal  Concentration:  Concentration: Fair and Attention Span: Fair  Recall:  [employer] of Knowledge: Fair  Language: Fair  Akathisia:  No  Handed:  Right  AIMS (if indicated):denies tremors, rigidity  Assets:  Communication Skills Desire for Improvement Housing Social  Support   ADL's:  Intact  Cognition: WNL  Sleep:  Improving   Screenings:   Assessment and Plan: Margaret [name] is a 63 year old Caucasian female on disability, divorced, lives in [place], has a history of bipolar disorder, chronic pain, diabetes melitis, hypertension, hyperlipidemia was evaluated by telemedicine today.  Patient is biologically predisposed given her family history as well as history of trauma.  Patient with psychosocial stressors of her mother's health issues, current pandemic.  Patient with recent worsening of mood symptoms as well as physical symptoms of possible side effects to medication is currently making progress.  Plan as noted below.  Plan Bipolar disorder type I manic-improving Abilify reduced dosage of 5 mg p.o. daily.  She did not tolerate the higher dosage. Lamictal 200 mg p.o. daily. Gabapentin 600 mg p.o. 3 times daily Celexa 40 mg p.o. daily  Insomnia likely due to mental health problems as well as pain. She could not afford the Ambien. She is currently on melatonin.  She wants to stay on it for now.  Follow-up in clinic in 3 to 4 weeks or sooner if needed.  January 12, 9 am   I have spent atleast 15 minutes non- face to face with patient today. More than 50 % of the time was spent for psychoeducation and supportive psychotherapy and care coordination. This note was generated in part or whole with voice recognition software. Voice recognition is usually quite accurate but there are transcription errors that can and very often do occur. I apologize for any typographical errors that were not detected and corrected.       Ursula Alert, MD 10/19/2019, 10:56 AM

## 2019-10-19 NOTE — Progress Notes (Deleted)
error 

## 2019-11-08 ENCOUNTER — Telehealth: Payer: Self-pay | Admitting: *Deleted

## 2019-11-08 NOTE — Telephone Encounter (Signed)
She has appt with me end of the month. Please move her lab and appt/IV iron up. Labs prior. Thanks.

## 2019-11-08 NOTE — Telephone Encounter (Signed)
Patient called reporting that she thinks she is having iron problems. She is very tired, her tongue is sore and her lips are chapping. Please advise.

## 2019-11-08 NOTE — Telephone Encounter (Signed)
Schedule message sent to scheduling pool

## 2019-11-13 ENCOUNTER — Other Ambulatory Visit: Payer: Self-pay

## 2019-11-13 ENCOUNTER — Inpatient Hospital Stay: Payer: Medicaid Other | Attending: Oncology

## 2019-11-13 DIAGNOSIS — D509 Iron deficiency anemia, unspecified: Secondary | ICD-10-CM | POA: Diagnosis not present

## 2019-11-13 DIAGNOSIS — D500 Iron deficiency anemia secondary to blood loss (chronic): Secondary | ICD-10-CM

## 2019-11-13 DIAGNOSIS — D696 Thrombocytopenia, unspecified: Secondary | ICD-10-CM | POA: Insufficient documentation

## 2019-11-13 DIAGNOSIS — R5383 Other fatigue: Secondary | ICD-10-CM | POA: Diagnosis not present

## 2019-11-13 DIAGNOSIS — K7469 Other cirrhosis of liver: Secondary | ICD-10-CM | POA: Diagnosis not present

## 2019-11-13 DIAGNOSIS — Z87891 Personal history of nicotine dependence: Secondary | ICD-10-CM | POA: Insufficient documentation

## 2019-11-13 DIAGNOSIS — R161 Splenomegaly, not elsewhere classified: Secondary | ICD-10-CM | POA: Diagnosis not present

## 2019-11-13 LAB — CBC WITH DIFFERENTIAL/PLATELET
Abs Immature Granulocytes: 0.04 10*3/uL (ref 0.00–0.07)
Basophils Absolute: 0 10*3/uL (ref 0.0–0.1)
Basophils Relative: 0 %
Eosinophils Absolute: 0.1 10*3/uL (ref 0.0–0.5)
Eosinophils Relative: 2 %
HCT: 40.6 % (ref 36.0–46.0)
Hemoglobin: 13 g/dL (ref 12.0–15.0)
Immature Granulocytes: 1 %
Lymphocytes Relative: 30 %
Lymphs Abs: 2 10*3/uL (ref 0.7–4.0)
MCH: 29.1 pg (ref 26.0–34.0)
MCHC: 32 g/dL (ref 30.0–36.0)
MCV: 90.8 fL (ref 80.0–100.0)
Monocytes Absolute: 0.6 10*3/uL (ref 0.1–1.0)
Monocytes Relative: 9 %
Neutro Abs: 4 10*3/uL (ref 1.7–7.7)
Neutrophils Relative %: 58 %
Platelets: 117 10*3/uL — ABNORMAL LOW (ref 150–400)
RBC: 4.47 MIL/uL (ref 3.87–5.11)
RDW: 14.2 % (ref 11.5–15.5)
WBC: 6.7 10*3/uL (ref 4.0–10.5)
nRBC: 0 % (ref 0.0–0.2)

## 2019-11-13 LAB — COMPREHENSIVE METABOLIC PANEL
ALT: 43 U/L (ref 0–44)
AST: 67 U/L — ABNORMAL HIGH (ref 15–41)
Albumin: 3.9 g/dL (ref 3.5–5.0)
Alkaline Phosphatase: 105 U/L (ref 38–126)
Anion gap: 9 (ref 5–15)
BUN: 10 mg/dL (ref 8–23)
CO2: 28 mmol/L (ref 22–32)
Calcium: 9.5 mg/dL (ref 8.9–10.3)
Chloride: 101 mmol/L (ref 98–111)
Creatinine, Ser: 0.62 mg/dL (ref 0.44–1.00)
GFR calc Af Amer: >60 mL/min (ref >60)
GFR calc non Af Amer: >60 mL/min (ref >60)
Glucose, Bld: 104 mg/dL — ABNORMAL HIGH (ref 70–99)
Potassium: 3.7 mmol/L (ref 3.5–5.1)
Sodium: 138 mmol/L (ref 135–145)
Total Bilirubin: 1.4 mg/dL — ABNORMAL HIGH (ref 0.3–1.2)
Total Protein: 7.4 g/dL (ref 6.5–8.1)

## 2019-11-13 LAB — RETIC PANEL
Immature Retic Fract: 12 % (ref 2.3–15.9)
RBC.: 4.43 MIL/uL (ref 3.87–5.11)
Retic Count, Absolute: 109.4 10*3/uL (ref 19.0–186.0)
Retic Ct Pct: 2.5 % (ref 0.4–3.1)
Reticulocyte Hemoglobin: 33 pg (ref >27.9)

## 2019-11-13 LAB — URINALYSIS, COMPLETE (UACMP) WITH MICROSCOPIC
Bacteria, UA: NONE SEEN
Bilirubin Urine: NEGATIVE
Glucose, UA: NEGATIVE mg/dL
Hgb urine dipstick: NEGATIVE
Ketones, ur: NEGATIVE mg/dL
Nitrite: NEGATIVE
Protein, ur: NEGATIVE mg/dL
Specific Gravity, Urine: 1.01 (ref 1.005–1.030)
pH: 6 (ref 5.0–8.0)

## 2019-11-13 LAB — IRON AND TIBC
Iron: 147 ug/dL (ref 28–170)
Saturation Ratios: 31 % (ref 10.4–31.8)
TIBC: 479 ug/dL — ABNORMAL HIGH (ref 250–450)
UIBC: 332 ug/dL

## 2019-11-13 LAB — FERRITIN: Ferritin: 36 ng/mL (ref 11–307)

## 2019-11-14 ENCOUNTER — Encounter: Payer: Self-pay | Admitting: Psychiatry

## 2019-11-14 ENCOUNTER — Ambulatory Visit (INDEPENDENT_AMBULATORY_CARE_PROVIDER_SITE_OTHER): Payer: Medicaid Other | Admitting: Psychiatry

## 2019-11-14 DIAGNOSIS — F5105 Insomnia due to other mental disorder: Secondary | ICD-10-CM

## 2019-11-14 DIAGNOSIS — F419 Anxiety disorder, unspecified: Secondary | ICD-10-CM | POA: Diagnosis not present

## 2019-11-14 DIAGNOSIS — F3173 Bipolar disorder, in partial remission, most recent episode manic: Secondary | ICD-10-CM | POA: Diagnosis not present

## 2019-11-14 MED ORDER — PROPRANOLOL HCL 10 MG PO TABS: 10.0000 mg | ORAL_TABLET | Freq: Three times a day (TID) | ORAL | 0 refills | Status: DC | PRN

## 2019-11-14 NOTE — Progress Notes (Signed)
Virtual Visit via Video Note  I connected with Margaret [name] on 11/14/19 at  9:00 AM EST by a video enabled telemedicine application and verified that I am speaking with the correct person using two identifiers.   I discussed the limitations of evaluation and management by telemedicine and the availability of in person appointments. The patient expressed understanding and agreed to proceed.    I discussed the assessment and treatment plan with the patient. The patient was provided an opportunity to ask questions and all were answered. The patient agreed with the plan and demonstrated an understanding of the instructions.   The patient was advised to call back or seek an in-person evaluation if the symptoms worsen or if the condition fails to improve as anticipated.   [place] MD OP Progress Note  11/14/2019 12:42 PM Margaret [name]  MRN:  [id number]  Chief Complaint:  Chief Complaint    Follow-up     HPI: Margaret [name] is a 64 year old Caucasian female on disability, lives in [place], divorced, has a history of bipolar disorder, hyperlipidemia, insomnia, diabetes melitis, chronic pain, hypertension was evaluated by telemedicine today.  Patient today reports she continues to struggle with anxiety symptoms.  She reports she is trying her best to cope with it.  She however continues to have several psychosocial stressors.  Her mother is currently admitted to the hospital and she does not believe the prognosis is good.  That does make her anxious.  She reports she tried taking hydroxyzine again however it continues to make her dizzy.  She continues to be compliant on her other medications like Abilify and gabapentin.  Patient however reports her pain provider suggested she come off of gabapentin.  She however reports she is not ready to do that since she takes it for her mood symptoms and has been on it for a while.  She will have this discussion with her pain provider.  Patient denies any suicidality,  homicidality or perceptual disturbances.  Patient denies any other concerns today. Visit Diagnosis:    ICD-10-CM   1. Bipolar disorder, in partial remission, most recent episode manic (HCC)  F31.73   2. Anxiety disorder, unspecified type  F41.9 propranolol (INDERAL) 10 MG tablet  3. Insomnia due to mental condition  F51.05     Past Psychiatric History: I have reviewed past psychiatric history from my progress note on 08/21/2019.  Past trials of Depakote, lithium, risperidone, Xanax  Past Medical History:  Past Medical History:  Diagnosis Date  . Bipolar 1 disorder ([place])   . Diabetes mellitus without complication (HCC)    Type 2  . Hypertension     Past Surgical History:  Procedure Laterality Date  . ABDOMINAL HYSTERECTOMY    . CHOLECYSTECTOMY    . COLONOSCOPY WITH PROPOFOL N/A 06/23/2019   Procedure: COLONOSCOPY WITH PROPOFOL;  Surgeon: Jonathon Bellows, MD;  Location: [facility] ENDOSCOPY;  Service: Gastroenterology;  Laterality: N/A;  . ESOPHAGOGASTRODUODENOSCOPY (EGD) WITH PROPOFOL N/A 06/23/2019   Procedure: ESOPHAGOGASTRODUODENOSCOPY (EGD) WITH PROPOFOL;  Surgeon: Jonathon Bellows, MD;  Location: [facility] ENDOSCOPY;  Service: Gastroenterology;  Laterality: N/A;  . TONSILLECTOMY      Family Psychiatric History: Reviewed family psychiatric history from my progress note on 08/21/2019.  Family History:  Family History  Problem Relation Age of Onset  . Mental illness Father   . Bipolar disorder Brother   . Bipolar disorder Paternal Uncle   . Bipolar disorder Paternal Uncle     Social History: Reviewed social history from my progress note on  08/21/2019. Social History   Socioeconomic History  . Marital status: Divorced    Spouse name: Not on file  . Number of children: 2  . Years of education: Not on file  . Highest education level: Not on file  Occupational History  . Not on file  Tobacco Use  . Smoking status: Former Smoker    Types: Cigarettes    Quit date: 03/03/2011    Years  since quitting: 8.7  . Smokeless tobacco: Never Used  Substance and Sexual Activity  . Alcohol use: Not Currently  . Drug use: Not Currently  . Sexual activity: Not on file  Other Topics Concern  . Not on file  Social History Narrative  . Not on file   Social Determinants of Health   Financial Resource Strain:   . Difficulty of Paying Living Expenses: Not on file  Food Insecurity:   . Worried About Charity fundraiser in the Last Year: Not on file  . Ran Out of Food in the Last Year: Not on file  Transportation Needs:   . Lack of Transportation (Medical): Not on file  . Lack of Transportation (Non-Medical): Not on file  Physical Activity:   . Days of Exercise per Week: Not on file  . Minutes of Exercise per Session: Not on file  Stress:   . Feeling of Stress : Not on file  Social Connections:   . Frequency of Communication with Friends and Family: Not on file  . Frequency of Social Gatherings with Friends and Family: Not on file  . Attends Religious Services: Not on file  . Active Member of Clubs or Organizations: Not on file  . Attends Archivist Meetings: Not on file  . Marital Status: Not on file    Allergies:  Allergies  Allergen Reactions  . Penicillins Hives    Resulted in hospitalization  . Sulfa Antibiotics Hives    Resulted in hospitalization    Metabolic Disorder Labs: Lab Results  Component Value Date   HGBA1C 7.3 (H) 06/22/2019   MPG 162.81 06/22/2019   No results found for: PROLACTIN No results found for: CHOL, TRIG, HDL, CHOLHDL, VLDL, LDLCALC No results found for: TSH  Therapeutic Level Labs: No results found for: LITHIUM No results found for: VALPROATE No components found for:  CBMZ  Current Medications: Current Outpatient Medications  Medication Sig Dispense Refill  . acetaminophen (TYLENOL) 500 MG tablet Take by mouth.    Marland Kitchen alendronate (FOSAMAX) 35 MG tablet Take 35 mg by mouth every 7 (seven) days. Take with a full glass of  water on an empty stomach.    Marland Kitchen amLODipine (NORVASC) 10 MG tablet Take 10 mg by mouth daily.    . ARIPiprazole (ABILIFY) 5 MG tablet Take 1 tablet (5 mg total) by mouth daily. 30 tablet 1  . B Complex Vitamins (VITAMIN B-COMPLEX) TABS Take by mouth.    . B Complex-C (B-COMPLEX WITH VITAMIN C) tablet Take 1 tablet by mouth daily. 30 tablet 2  . cetirizine (ZYRTEC) 10 MG tablet Take 10 mg by mouth daily.    . citalopram (CELEXA) 40 MG tablet Take 1 tablet (40 mg total) by mouth daily. 90 tablet 0  . Dulaglutide (TRULICITY) [id number] 0000000 SOPN Inject 0.75 mg into the skin once a week. Thursday    . Ertugliflozin L-PyroglutamicAc (STEGLATRO) 15 MG TABS Take by mouth.    . ferrous sulfate 325 (65 FE) MG tablet Take 1 tablet (325 mg total) by mouth 2 (  two) times daily with a meal. 120 tablet 0  . fluconazole (DIFLUCAN) 100 MG tablet Take 100 mg by mouth daily.    Marland Kitchen gabapentin (NEURONTIN) 600 MG tablet Take 1 tablet (600 mg total) by mouth 3 (three) times daily. 270 tablet 0  . glimepiride (AMARYL) 2 MG tablet Take by mouth.    . hydrochlorothiazide (HYDRODIURIL) 25 MG tablet Take by mouth.    Marland Kitchen HYDROcodone-acetaminophen (NORCO) 10-325 MG tablet Take 1 tablet by mouth 3 (three) times daily.    . Insulin Glargine (LANTUS SOLOSTAR) 100 UNIT/ML Solostar Pen Inject into the skin.    Marland Kitchen lamoTRIgine (LAMICTAL) 200 MG tablet Take 1 tablet (200 mg total) by mouth daily. 90 tablet 0  . LANTUS SOLOSTAR 100 UNIT/ML Solostar Pen SMARTSIG:60 Unit(s) SUB-Q Every Night    . lisinopril (ZESTRIL) 20 MG tablet Take 20 mg by mouth daily.    . meclizine (ANTIVERT) 25 MG tablet Take 25 mg by mouth 2 (two) times daily as needed.    . metFORMIN (GLUCOPHAGE) 1000 MG tablet Take 1,000 mg by mouth 2 (two) times daily.    . metFORMIN (GLUCOPHAGE) 500 MG tablet Take 500 mg by mouth 2 (two) times daily with a meal.    . MOVANTIK 25 MG TABS tablet Take 25 mg by mouth daily.    Marland Kitchen NOVOFINE 32G X 6 MM MISC USE TWICE DAILY ASADIRECTEDO     . nystatin-triamcinolone (MYCOLOG II) cream Apply topically 2 (two) times daily. (Patient not taking: Reported on 09/19/2019) 30 g 0  . olmesartan (BENICAR) 20 MG tablet Take by mouth.    . pantoprazole (PROTONIX) 40 MG tablet Take 40 mg by mouth daily.    . propranolol (INDERAL) 10 MG tablet Take 1 tablet (10 mg total) by mouth 3 (three) times daily as needed. For severe anxiety attacks only 90 tablet 0  . VICTOZA 18 MG/3ML SOPN Inject 1.2 mg into the skin daily.     No current facility-administered medications for this visit.     Musculoskeletal: Strength & Muscle Tone: UTA Gait & Station: normal Patient leans: N/A  Psychiatric Specialty Exam: Review of Systems  Psychiatric/Behavioral: The patient is nervous/anxious.   All other systems reviewed and are negative.   There were no vitals taken for this visit.There is no height or weight on file to calculate BMI.  General Appearance: Casual  Eye Contact:  Fair  Speech:  Normal Rate  Volume:  Normal  Mood:  Anxious  Affect:  Full Range  Thought Process:  Goal Directed and Descriptions of Associations: Intact  Orientation:  Full (Time, Place, and Person)  Thought Content: Logical   Suicidal Thoughts:  No  Homicidal Thoughts:  No  Memory:  Immediate;   Fair Recent;   Fair Remote;   Fair  Judgement:  Fair  Insight:  Fair  Psychomotor Activity:  Normal  Concentration:  Concentration: Fair and Attention Span: Fair  Recall:  [employer] of Knowledge: Fair  Language: Fair  Akathisia:  No  Handed:  Right  AIMS (if indicated): Denies tremors, rigidity  Assets:  Communication Skills Desire for Improvement Housing  ADL's:  Intact  Cognition: WNL  Sleep:  Fair   Screenings:   Assessment and Plan: Aiyannah is a 64 year old Caucasian female on disability, divorced, lives in [place], has a history of bipolar disorder, chronic pain, diabetes melitis, hypertension, hyperlipidemia was evaluated by telemedicine today.  She is  biologically predisposed given her family history as well as history of trauma.  Patient with psychosocial stressors of her mother's health issues and the current pandemic.  She continues to struggle with anxiety symptoms although her mood swings are improving.  Plan as noted below.  Plan Bipolar disorder type I manic-in partial remission Abilify 5 mg p.o. daily.  She did not tolerate the higher dosage. Lamictal 200 mg p.o. daily. Gabapentin 600 mg p.o. 3 times daily. Celexa 40 mg p.o. daily.  Anxiety disorder unspecified-unstable Patient with several psychosocial stressors. Discontinue hydroxyzine due to side effects. Start propranolol 10 mg p.o. 3 times daily as needed for anxiety attacks only.   Insomnia likely due to mental health problems as well as pain-improving She will continue melatonin as needed. She could not afford the Ambien and has not picked up the prescription yet. She currently declines any other medications.  Follow-up in clinic in 1 month or sooner if needed.  February 11 at 1:20 PM  I have spent atleast 20 minutes non face to face with patient today. More than 50 % of the time was spent for  ordering medications and test ,psychoeducation and supportive psychotherapy and care coordination,as well as documenting clinical information in electronic health record. This note was generated in part or whole with voice recognition software. Voice recognition is usually quite accurate but there are transcription errors that can and very often do occur. I apologize for any typographical errors that were not detected and corrected.       Margaret Alert, MD 11/14/2019, 12:42 PM

## 2019-11-15 ENCOUNTER — Inpatient Hospital Stay: Payer: Medicaid Other

## 2019-11-15 NOTE — Progress Notes (Signed)
Called patient no answer left voicemail

## 2019-11-16 ENCOUNTER — Inpatient Hospital Stay (HOSPITAL_BASED_OUTPATIENT_CLINIC_OR_DEPARTMENT_OTHER): Payer: Medicaid Other | Admitting: Oncology

## 2019-11-16 ENCOUNTER — Other Ambulatory Visit: Payer: Self-pay

## 2019-11-16 ENCOUNTER — Inpatient Hospital Stay: Payer: Medicaid Other

## 2019-11-16 ENCOUNTER — Encounter: Payer: Self-pay | Admitting: Oncology

## 2019-11-16 VITALS — BP 162/83 | HR 62 | Temp 96.6°F | Resp 18 | Wt 192.6 lb

## 2019-11-16 DIAGNOSIS — D696 Thrombocytopenia, unspecified: Secondary | ICD-10-CM | POA: Diagnosis not present

## 2019-11-16 DIAGNOSIS — D500 Iron deficiency anemia secondary to blood loss (chronic): Secondary | ICD-10-CM | POA: Diagnosis not present

## 2019-11-16 DIAGNOSIS — K7469 Other cirrhosis of liver: Secondary | ICD-10-CM | POA: Diagnosis not present

## 2019-11-16 DIAGNOSIS — R161 Splenomegaly, not elsewhere classified: Secondary | ICD-10-CM | POA: Diagnosis not present

## 2019-11-16 LAB — TSH: TSH: 1.737 u[IU]/mL (ref 0.350–4.500)

## 2019-11-16 NOTE — Progress Notes (Signed)
Patient here for follow up. Pt reports being tired all the time. Pt has a 8lb weight gain since September, pt states its most likely fluid retention.

## 2019-11-16 NOTE — Progress Notes (Signed)
Hematology/Oncology follow up note [facility] [phone] [phone]   Patient Care Team: Rusty Aus, MD as PCP - General (Internal Medicine) Earlie Server, MD as Consulting Physician (Oncology)  REFERRING PROVIDER: Rusty Aus, MD CHIEF COMPLAINTS/REASON FOR VISIT:  Follow-up for iron deficiency anemia  HISTORY OF PRESENTING ILLNESS:  Margaret [name] is a  64 y.o.  female with PMH listed below was seen in consultation at the request of Rusty Aus, MD   for evaluation of iron deficiency anemia.  Patient was recently admitted from 06/21/2019-06/24/2019 due to severe anemia. She was found to have a hemoglobin of 5.7 upon admission. During the hospitalization, patient received 2 units of blood transfusion, 3 doses of IV iron. Discharged on oral iron supplementation. EGD and colonoscopy were performed during the hospitalization EGD and colonoscopy on 06/23/2019.  EGD showed no abnormalities and the colonoscopy showed 3 sessile diminutive polyps which were resected completely.  Arteries of the duodenum were normal.  3 colon polyps resected were serrated polyp x2 and a tubular adenoma x1. No active source of bleeding was noted.  Patient was scheduled for capsule endoscopy as outpatient. Today patient reports feeling well.  She has been taking oral iron supplementation.  Denies hematochezia, hematuria, hematemesis, epistaxis, black tarry stool or easy bruising.  Still feels some fatigue, not back to her baseline yet.  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  INTERVAL HISTORY Margaret [name] is a 64 y.o. female who has above history reviewed by me today presents for follow up visit for management of iron deficiency anemia Problems and complaints are listed below: Patient denies new complaints today.  She continues to feel fatigued.   Also had weight gain of 8 pounds since September.  She attributes to fluid retention.  Review of Systems  Constitutional: Positive for fatigue. Negative for appetite change, chills and fever.  HENT:   Negative for hearing loss and voice change.   Eyes: Negative for eye problems.  Respiratory: Negative for chest tightness and cough.   Cardiovascular: Negative for chest pain.  Gastrointestinal: Negative for abdominal distention, abdominal pain and blood in stool.  Endocrine: Negative for hot flashes.  Genitourinary: Negative for difficulty urinating and frequency.   Musculoskeletal: Negative for arthralgias.  Skin: Negative for itching and rash.  Neurological: Negative for extremity weakness.  Hematological: Negative for adenopathy.  Psychiatric/Behavioral: Negative for confusion.    MEDICAL HISTORY:  Past Medical History:  Diagnosis Date  . Bipolar 1 disorder ([place])   . Diabetes mellitus without complication (HCC)    Type 2  . Hypertension     SURGICAL HISTORY: Past Surgical History:  Procedure Laterality Date  . ABDOMINAL HYSTERECTOMY    . CHOLECYSTECTOMY    . COLONOSCOPY WITH PROPOFOL N/A 06/23/2019   Procedure: COLONOSCOPY WITH PROPOFOL;  Surgeon: Jonathon Bellows, MD;  Location: [facility] ENDOSCOPY;  Service: Gastroenterology;  Laterality: N/A;  . ESOPHAGOGASTRODUODENOSCOPY (EGD) WITH PROPOFOL N/A 06/23/2019   Procedure: ESOPHAGOGASTRODUODENOSCOPY (EGD) WITH PROPOFOL;  Surgeon: Jonathon Bellows, MD;  Location: [facility] ENDOSCOPY;  Service: Gastroenterology;  Laterality: N/A;  . TONSILLECTOMY      SOCIAL HISTORY: Social History   Socioeconomic History  . Marital status: Divorced    Spouse name: Not on file  . Number of children: 2  . Years of education: Not on file  . Highest education level: Not on file  Occupational History  . Not on file  Tobacco Use  . Smoking status: Former Smoker    Types: Cigarettes    Quit date: 03/03/2011    Years since quitting: 8.7  . Smokeless tobacco:  Never Used  Substance and Sexual Activity  . Alcohol use: Not Currently  . Drug use: Not Currently  . Sexual activity: Not on file  Other Topics Concern  . Not on file  Social History Narrative  . Not on file   Social Determinants of Health   Financial Resource Strain:   . Difficulty of Paying Living Expenses: Not on file  Food Insecurity:   . Worried About Charity fundraiser in the Last Year: Not on file  . Ran Out of Food in the Last Year: Not on file  Transportation Needs:   . Lack of Transportation (Medical): Not on file  . Lack of Transportation (Non-Medical): Not on file  Physical Activity:   . Days of Exercise per Week: Not on file  . Minutes of Exercise per Session: Not on file  Stress:   . Feeling of Stress : Not on file  Social Connections:   . Frequency of Communication with Friends and Family: Not on file  . Frequency of Social Gatherings with Friends and Family: Not on file  . Attends Religious Services: Not on file  . Active Member of Clubs or Organizations: Not on file  . Attends Archivist Meetings: Not on file  . Marital Status: Not on file  Intimate Partner Violence:   . Fear of Current or Ex-Partner: Not on file  . Emotionally Abused: Not on file  . Physically Abused: Not on file  . Sexually Abused: Not on file    FAMILY HISTORY: Family History  Problem Relation Age of Onset  . Mental illness Father   . Bipolar disorder Brother   . Bipolar disorder Paternal Uncle   . Bipolar disorder Paternal Uncle     ALLERGIES:  is allergic to penicillins and sulfa antibiotics.  MEDICATIONS:  Current Outpatient Medications  Medication Sig Dispense Refill  . acetaminophen (TYLENOL) 500 MG tablet Take 500 mg by mouth every 6 (six) hours as needed.     Marland Kitchen alendronate (FOSAMAX) 35 MG tablet Take 35 mg by mouth every 7 (seven) days. Take with a full glass of water on an empty stomach.    Marland Kitchen amLODipine (NORVASC) 10 MG tablet Take 10 mg by mouth daily.     . ARIPiprazole (ABILIFY) 5 MG tablet Take 1 tablet (5 mg total) by mouth daily. (Patient taking differently: Take 10 mg by mouth daily. ) 30 tablet 1  . B Complex-C (B-COMPLEX WITH VITAMIN C) tablet Take 1 tablet by mouth daily. 30 tablet 2  . cetirizine (ZYRTEC) 10 MG tablet Take 10 mg by mouth daily.    . citalopram (CELEXA) 40 MG tablet Take 1 tablet (40 mg total) by mouth daily. 90 tablet 0  . Dulaglutide (TRULICITY) A999333 0000000 SOPN Inject 0.75 mg into the skin once a week. Thursday    . Ertugliflozin L-PyroglutamicAc (STEGLATRO) 15 MG TABS Take by mouth.    . ferrous sulfate 325 (65 FE) MG tablet Take 1 tablet (325 mg total) by mouth 2 (two) times daily with a meal. 120 tablet 0  . fluconazole (DIFLUCAN) 100 MG tablet Take 100 mg by mouth daily.    Marland Kitchen gabapentin (NEURONTIN) 600 MG tablet Take 1 tablet (600 mg total) by mouth 3 (three) times daily. 270 tablet 0  . glimepiride (AMARYL) 2 MG tablet  Take 2 mg by mouth daily with breakfast.     . hydrochlorothiazide (HYDRODIURIL) 25 MG tablet Take 25 mg by mouth daily.     Marland Kitchen HYDROcodone-acetaminophen (NORCO) 10-325 MG tablet Take 1 tablet by mouth 3 (three) times daily.    . Insulin Glargine (LANTUS SOLOSTAR) 100 UNIT/ML Solostar Pen Inject 60 Units into the skin at bedtime.     . lamoTRIgine (LAMICTAL) 200 MG tablet Take 1 tablet (200 mg total) by mouth daily. 90 tablet 0  . metFORMIN (GLUCOPHAGE) 1000 MG tablet Take 1,000 mg by mouth 2 (two) times daily.    Marland Kitchen MOVANTIK 25 MG TABS tablet Take 25 mg by mouth daily.    Marland Kitchen NOVOFINE 32G X 6 MM MISC USE TWICE DAILY ASADIRECTEDO    . olmesartan (BENICAR) 20 MG tablet Take 20 mg by mouth daily.     . pantoprazole (PROTONIX) 40 MG tablet Take 40 mg by mouth daily.    . propranolol (INDERAL) 10 MG tablet Take 1 tablet (10 mg total) by mouth 3 (three) times daily as needed. For severe anxiety attacks only 90 tablet 0  . B Complex Vitamins (VITAMIN B-COMPLEX) TABS Take 1 tablet by mouth daily.     Marland Kitchen  LANTUS SOLOSTAR 100 UNIT/ML Solostar Pen SMARTSIG:60 Unit(s) SUB-Q Every Night    . lisinopril (ZESTRIL) 20 MG tablet Take 20 mg by mouth daily.    . meclizine (ANTIVERT) 25 MG tablet Take 25 mg by mouth 2 (two) times daily as needed.    . metFORMIN (GLUCOPHAGE) 500 MG tablet Take 500 mg by mouth 2 (two) times daily with a meal.    . nystatin-triamcinolone (MYCOLOG II) cream Apply topically 2 (two) times daily. (Patient not taking: Reported on 09/19/2019) 30 g 0  . VICTOZA 18 MG/3ML SOPN Inject 1.2 mg into the skin daily.     No current facility-administered medications for this visit.     PHYSICAL EXAMINATION: ECOG PERFORMANCE STATUS: 1 - Symptomatic but completely ambulatory Vitals:   11/16/19 1018  BP: (!) 162/83  Pulse: 62  Resp: 18  Temp: (!) 96.6 F (35.9 C)   Filed Weights   11/16/19 1018  Weight: 192 lb 9.6 oz (87.4 kg)    Physical Exam Constitutional:      General: She is not in acute distress.    Appearance: She is obese.  HENT:     Head: Normocephalic and atraumatic.  Eyes:     General: No scleral icterus.    Pupils: Pupils are equal, round, and reactive to light.  Cardiovascular:     Rate and Rhythm: Normal rate and regular rhythm.     Heart sounds: Normal heart sounds.  Pulmonary:     Effort: Pulmonary effort is normal. No respiratory distress.     Breath sounds: No wheezing.  Abdominal:     General: Bowel sounds are normal. There is no distension.     Palpations: Abdomen is soft. There is no mass.     Tenderness: There is no abdominal tenderness.  Musculoskeletal:        General: No deformity. Normal range of motion.     Cervical back: Normal range of motion and neck supple.  Skin:    General: Skin is warm and dry.     Findings: No erythema or rash.  Neurological:     Mental Status: She is alert and oriented to person, place, and time.     Cranial Nerves: No cranial nerve deficit.     Coordination:  Coordination normal.  Psychiatric:         Behavior: Behavior normal.        Thought Content: Thought content normal.       CMP Latest Ref Rng & Units 11/13/2019  Glucose 70 - 99 mg/dL 104(H)  BUN 8 - 23 mg/dL 10  Creatinine 0.44 - 1.00 mg/dL 0.62  Sodium 135 - 145 mmol/L 138  Potassium 3.5 - 5.1 mmol/L 3.7  Chloride 98 - 111 mmol/L 101  CO2 22 - 32 mmol/L 28  Calcium 8.9 - 10.3 mg/dL 9.5  Total Protein 6.5 - 8.1 g/dL 7.4  Total Bilirubin 0.3 - 1.2 mg/dL 1.4(H)  Alkaline Phos 38 - 126 U/L 105  AST 15 - 41 U/L 67(H)  ALT 0 - 44 U/L 43   CBC Latest Ref Rng & Units 11/13/2019  WBC 4.0 - 10.5 K/uL 6.7  Hemoglobin 12.0 - 15.0 g/dL 13.0  Hematocrit 36.0 - 46.0 % 40.6  Platelets 150 - 400 K/uL 117(L)     LABORATORY DATA:  I have reviewed the data as listed Lab Results  Component Value Date   WBC 6.7 11/13/2019   HGB 13.0 11/13/2019   HCT 40.6 11/13/2019   MCV 90.8 11/13/2019   PLT 117 (L) 11/13/2019   Recent Labs    06/21/19 1725 06/22/19 0502 11/13/19 1441  NA 136 139 138  K 3.7 3.7 3.7  CL 107 109 101  CO2 22 22 28   GLUCOSE 111* 100* 104*  BUN 10 8 10   CREATININE 0.65 0.69 0.62  CALCIUM 8.2* 8.4* 9.5  GFRNONAA >60 >60 >60  GFRAA >60 >60 >60  PROT 5.5*  --  7.4  ALBUMIN 2.7*  --  3.9  AST 41  --  67*  ALT 25  --  43  ALKPHOS 76  --  105  BILITOT 0.9  --  1.4*   Iron/TIBC/Ferritin/ %Sat    Component Value Date/Time   IRON 147 11/13/2019 1441   TIBC 479 (H) 11/13/2019 1441   FERRITIN 36 11/13/2019 1441   IRONPCTSAT 31 11/13/2019 1441     No results found.    ASSESSMENT & PLAN:  1. Thrombocytopenia ([place])   2. Iron deficiency anemia due to chronic blood loss   3. Splenomegaly   4. Other cirrhosis of liver (HCC)    #Iron deficiency anemia Labs reviewed and discussed with patient. Hemoglobin 13, Iron panel showed TIBC is 479, iron saturation 31, ferritin 36. Patient is currently taking ferrous sulfate 325 mg 2 tablets 3 times daily with meals.  Advised patient to decrease to 325 mg twice  daily. Etiology of blood loss discovered.  Status post EGD colonoscopy.  No GI bleeding source was discovered.  UA negative for hematuria.  Recommend patient to follow-up with gastroenterology for discussion small capsule study  #Fatigue, etiology unknown.  Likely secondary to multiple comorbidities.  Check TSH. #Liver cirrhosis, splenomegaly, small volume ascites.  Patient does not drink alcohol..  Etiology of cirrhosis is unknown.  Defer to gastroenterology for work-up.  Thrombocytopenia likely secondary to splenomegaly.  Counts are stable.  Orders Placed This Encounter  Procedures  . TSH    Standing Status:   Future    Number of Occurrences:   1    Standing Expiration Date:   11/15/2020    All questions were answered. The patient knows to call the clinic with any problems questions or concerns.  Cc Rusty Aus, MD  Return of visit:  4 months.  Earlie Server, MD, PhD Hematology Oncology [facility] at [facility] Pager- [phone] 11/16/2019

## 2019-11-17 ENCOUNTER — Telehealth: Payer: Self-pay | Admitting: Gastroenterology

## 2019-11-17 NOTE — Telephone Encounter (Signed)
Left vm to offer apt to  see Dr. Vicente Males for f/u Cirrhosis per Nurse note

## 2019-11-20 ENCOUNTER — Other Ambulatory Visit: Payer: Medicaid Other

## 2019-11-21 ENCOUNTER — Ambulatory Visit: Payer: Medicaid Other

## 2019-11-21 ENCOUNTER — Ambulatory Visit: Payer: Medicaid Other | Admitting: Oncology

## 2019-12-01 ENCOUNTER — Encounter: Payer: Self-pay | Admitting: Psychiatry

## 2019-12-05 ENCOUNTER — Other Ambulatory Visit: Payer: Self-pay | Admitting: Psychiatry

## 2019-12-05 DIAGNOSIS — F419 Anxiety disorder, unspecified: Secondary | ICD-10-CM

## 2019-12-06 ENCOUNTER — Other Ambulatory Visit: Payer: Self-pay

## 2019-12-06 ENCOUNTER — Ambulatory Visit: Payer: Medicaid Other | Admitting: Gastroenterology

## 2019-12-06 VITALS — BP 182/75 | HR 75 | Temp 98.3°F | Ht 61.5 in | Wt 191.0 lb

## 2019-12-06 DIAGNOSIS — D509 Iron deficiency anemia, unspecified: Secondary | ICD-10-CM

## 2019-12-06 DIAGNOSIS — K7460 Unspecified cirrhosis of liver: Secondary | ICD-10-CM

## 2019-12-06 DIAGNOSIS — R188 Other ascites: Secondary | ICD-10-CM

## 2019-12-06 NOTE — Progress Notes (Signed)
Jonathon Bellows MD, MRCP(U.K) [address]  [place]  [place], [place] [address]  Main: [phone]  Fax: [phone]   Primary Care Physician: Rusty Aus, MD  Primary Gastroenterologist:  Dr. Jonathon Bellows   No chief complaint on file.   HPI: Margaret [name] is a 64 y.o. female     Summary of history :  She was last seen in my office on 07/12/2019.  She was admitted into the hospital and 06/22/2019 for anemia. I was consulted to see her for the same.  On admission was found to have a hemoglobin of 5.7 with an MCV of 69.4.  Ferritin of 4, iron of 13.  Normal B12, folate, celiac serology.  She has never had a colonoscopy.  Mother had colon polyps.  Has been taking 1-2 naproxen's for hip pain few times a day for the past 18 months which I felt was the cause of her anemia.  I performed an EGD and colonoscopy on 06/23/2019.  EGD showed no abnormalities and the colonoscopy showed 3 sessile diminutive polyps which were resected completely.  Arteries of the duodenum were normal.  3 colon polyps resected were serrated polyp x2 and a tubular adenoma x1. 06/21/2019: Scan of the abdomen showed features of cirrhosis, splenomegaly and small volume ascites.  Small broad-based fat-containing midline ventral hernia.  Denies any family history of liver disease.   Interval history   07/12/2019-12/06/2019  After her last visit she was scheduled to undergo a capsule study of the small bowel but it never happened.  She was lost to follow-up for a while.  Labs to rule out autoimmune hepatitis and viral hepatitis were ordered at her last visit but were not obtained.  11/16/2019: TSH normal. 11/13/2019: Hemoglobin 13 g MCV 90.8, iron studies normal 11/16/2019 seen Dr. Tasia Catchings for iron deficiency anemia  Since her last visit she has been doing well.  She has been transferred over to oral iron pills 1 every other day.  No other GI symptoms.  She watches her salt in her diet.  She has lost weight.  She feels well.   No issues with sleep.  Denies any constipation. Current Outpatient Medications  Medication Sig Dispense Refill  . acetaminophen (TYLENOL) 500 MG tablet Take 500 mg by mouth every 6 (six) hours as needed.     Marland Kitchen alendronate (FOSAMAX) 35 MG tablet Take 35 mg by mouth every 7 (seven) days. Take with a full glass of water on an empty stomach.    Marland Kitchen amLODipine (NORVASC) 10 MG tablet Take 10 mg by mouth daily.    . ARIPiprazole (ABILIFY) 5 MG tablet Take 1 tablet (5 mg total) by mouth daily. (Patient taking differently: Take 10 mg by mouth daily. ) 30 tablet 1  . B Complex Vitamins (VITAMIN B-COMPLEX) TABS Take 1 tablet by mouth daily.     . B Complex-C (B-COMPLEX WITH VITAMIN C) tablet Take 1 tablet by mouth daily. 30 tablet 2  . cetirizine (ZYRTEC) 10 MG tablet Take 10 mg by mouth daily.    . citalopram (CELEXA) 40 MG tablet Take 1 tablet (40 mg total) by mouth daily. 90 tablet 0  . Dulaglutide (TRULICITY) A999333 0000000 SOPN Inject 0.75 mg into the skin once a week. Thursday    . Ertugliflozin L-PyroglutamicAc (STEGLATRO) 15 MG TABS Take by mouth.    . ferrous sulfate 325 (65 FE) MG tablet Take 1 tablet (325 mg total) by mouth 2 (two) times daily with a meal. 120 tablet  0  . fluconazole (DIFLUCAN) 100 MG tablet Take 100 mg by mouth daily.    Marland Kitchen gabapentin (NEURONTIN) 600 MG tablet Take 1 tablet (600 mg total) by mouth 3 (three) times daily. 270 tablet 0  . glimepiride (AMARYL) 2 MG tablet Take 2 mg by mouth daily with breakfast.     . hydrochlorothiazide (HYDRODIURIL) 25 MG tablet Take 25 mg by mouth daily.     Marland Kitchen HYDROcodone-acetaminophen (NORCO) 10-325 MG tablet Take 1 tablet by mouth 3 (three) times daily.    . Insulin Glargine (LANTUS SOLOSTAR) 100 UNIT/ML Solostar Pen Inject 60 Units into the skin at bedtime.     . lamoTRIgine (LAMICTAL) 200 MG tablet Take 1 tablet (200 mg total) by mouth daily. 90 tablet 0  . LANTUS SOLOSTAR 100 UNIT/ML Solostar Pen SMARTSIG:60 Unit(s) SUB-Q Every Night    .  lisinopril (ZESTRIL) 20 MG tablet Take 20 mg by mouth daily.    . meclizine (ANTIVERT) 25 MG tablet Take 25 mg by mouth 2 (two) times daily as needed.    . metFORMIN (GLUCOPHAGE) 1000 MG tablet Take 1,000 mg by mouth 2 (two) times daily.    . metFORMIN (GLUCOPHAGE) 500 MG tablet Take 500 mg by mouth 2 (two) times daily with a meal.    . MOVANTIK 25 MG TABS tablet Take 25 mg by mouth daily.    Marland Kitchen NOVOFINE 32G X 6 MM MISC USE TWICE DAILY ASADIRECTEDO    . nystatin-triamcinolone (MYCOLOG II) cream Apply topically 2 (two) times daily. (Patient not taking: Reported on 09/19/2019) 30 g 0  . olmesartan (BENICAR) 20 MG tablet Take 20 mg by mouth daily.     . pantoprazole (PROTONIX) 40 MG tablet Take 40 mg by mouth daily.    . propranolol (INDERAL) 10 MG tablet TAKE 1 TABLET BY MOUTH 3 TIMES DAILY AS NEEDED. FOR SEVERE ANXIETY ATTACKS ONLY 90 tablet 0  . VICTOZA 18 MG/3ML SOPN Inject 1.2 mg into the skin daily.     No current facility-administered medications for this visit.    Allergies as of 12/06/2019 - Review Complete 11/16/2019  Allergen Reaction Noted  . Penicillins Hives 12/03/2018  . Sulfa antibiotics Hives 12/03/2018    ROS:  General: Negative for anorexia, weight loss, fever, chills, fatigue, weakness. ENT: Negative for hoarseness, difficulty swallowing , nasal congestion. CV: Negative for chest pain, angina, palpitations, dyspnea on exertion, peripheral edema.  Respiratory: Negative for dyspnea at rest, dyspnea on exertion, cough, sputum, wheezing.  GI: See history of present illness. GU:  Negative for dysuria, hematuria, urinary incontinence, urinary frequency, nocturnal urination.  Endo: Negative for unusual weight change.    Physical Examination:   There were no vitals taken for this visit.  General: Well-nourished, well-developed in no acute distress.  Eyes: No icterus. Conjunctivae pink. Mouth: Oropharyngeal mucosa moist and pink , no lesions erythema or exudate. Lungs:  Clear to auscultation bilaterally. Non-labored. Heart: Regular rate and rhythm, no murmurs rubs or gallops.  Abdomen: Bowel sounds are normal, nontender, nondistended, no hepatosplenomegaly or masses, no abdominal bruits or hernia , no rebound or guarding.   Extremities: No lower extremity edema. No clubbing or deformities. Neuro: Alert and oriented x 3.  Grossly intact. Skin: Warm and dry, no jaundice.   Psych: Alert and cooperative, normal mood and affect.   Imaging Studies: No results found.  Assessment and Plan:   Lorilee Waldie is a 64 y.o. y/o female here to follow-up for iron deficiency anemia which was felt to be  due to long-term NSAID use.  EGD plus colonoscopy were noncontributory.  Incidentally on the CT scan of the abdomen was found to have features of cirrhosis and portal hypertension.  No esophageal varices noted and no hepatic encephalopathy.   Very likely etiology of cirrhosis secondary to nonalcoholic fatty liver disease   Plan 1.    Since anemia is resolved we can skip capsule study of the small bowel at this point of time.  It is very likely that her anemia was due to NSAID usage.  If there is a further drop in hemoglobin of iron then we will evaluate with capsule study of the small bowel. 2.  Counseled on NAFLD which would include tight control of cardiovascular risk factors, exercise, weight loss, healthy eating. 3. Perform viral hepatitis, autoimmune liver work-up, genetic disorders contributing to liver cirrhosis.  All labs will be re ordered.  Obtain CMP and INR to calculate meld score. 5.  Right upper quadrant ultrasound to screen for [facility] as well as to screen for ascites and basilar infiltrate recommend that she needs any changes to her diuretic regimen. 6.  EGD to screen for esophageal varices in 2023 7.  Low-sodium diet 8.  Avoid NSAIDs   Dr Jonathon Bellows  MD,MRCP [facility]) Follow up in 6 to 8 weeks

## 2019-12-10 LAB — PROTIME-INR
INR: 1.1 (ref 0.9–1.2)
Prothrombin Time: 11.7 s (ref 9.1–12.0)

## 2019-12-10 LAB — COMPREHENSIVE METABOLIC PANEL
ALT: 47 IU/L — ABNORMAL HIGH (ref 0–32)
AST: 48 IU/L — ABNORMAL HIGH (ref 0–40)
Albumin/Globulin Ratio: 1.5 (ref 1.2–2.2)
Albumin: 3.7 g/dL — ABNORMAL LOW (ref 3.8–4.8)
Alkaline Phosphatase: 122 IU/L — ABNORMAL HIGH (ref 39–117)
BUN/Creatinine Ratio: 19 (ref 12–28)
BUN: 11 mg/dL (ref 8–27)
Bilirubin Total: 0.7 mg/dL (ref 0.0–1.2)
CO2: 22 mmol/L (ref 20–29)
Calcium: 9.7 mg/dL (ref 8.7–10.3)
Chloride: 101 mmol/L (ref 96–106)
Creatinine, Ser: 0.59 mg/dL (ref 0.57–1.00)
GFR calc Af Amer: 114 mL/min/{1.73_m2} (ref >59)
GFR calc non Af Amer: 99 mL/min/{1.73_m2} (ref >59)
Globulin, Total: 2.4 g/dL (ref 1.5–4.5)
Glucose: 211 mg/dL — ABNORMAL HIGH (ref 65–99)
Potassium: 3.9 mmol/L (ref 3.5–5.2)
Sodium: 137 mmol/L (ref 134–144)
Total Protein: 6.1 g/dL (ref 6.0–8.5)

## 2019-12-10 LAB — CELIAC DISEASE AB SCREEN W/RFX
Antigliadin Abs, IgA: 4 units (ref 0–19)
Transglutaminase IgA: <2 U/mL (ref 0–3)

## 2019-12-10 LAB — CERULOPLASMIN: Ceruloplasmin: 24.8 mg/dL (ref 19.0–39.0)

## 2019-12-10 LAB — HEPATITIS C ANTIBODY: Hep C Virus Ab: <0.1 s/co ratio (ref 0.0–0.9)

## 2019-12-10 LAB — IMMUNOGLOBULINS A/E/G/M, SERUM
IgA/Immunoglobulin A, Serum: 361 mg/dL — ABNORMAL HIGH (ref 87–352)
IgE (Immunoglobulin E), Serum: 31 IU/mL (ref 6–495)
IgG (Immunoglobin G), Serum: 803 mg/dL (ref 586–1602)
IgM (Immunoglobulin M), Srm: 60 mg/dL (ref 26–217)

## 2019-12-10 LAB — HEPATITIS B SURFACE ANTIBODY,QUALITATIVE: Hep B Surface Ab, Qual: NONREACTIVE

## 2019-12-10 LAB — HEPATITIS B SURFACE ANTIGEN: Hepatitis B Surface Ag: NEGATIVE

## 2019-12-10 LAB — HEPATITIS B CORE ANTIBODY, TOTAL: Hep B Core Total Ab: NEGATIVE

## 2019-12-10 LAB — IRON,TIBC AND FERRITIN PANEL
Ferritin: 45 ng/mL (ref 15–150)
Iron Saturation: 9 % — CL (ref 15–55)
Iron: 37 ug/dL (ref 27–139)
Total Iron Binding Capacity: 428 ug/dL (ref 250–450)
UIBC: 391 ug/dL — ABNORMAL HIGH (ref 118–369)

## 2019-12-10 LAB — ANTI-MICROSOMAL ANTIBODY LIVER / KIDNEY: LKM1 Ab: 1.4 Units (ref 0.0–20.0)

## 2019-12-10 LAB — HIV ANTIBODY (ROUTINE TESTING W REFLEX): HIV Screen 4th Generation wRfx: NONREACTIVE

## 2019-12-10 LAB — CK: Total CK: 66 U/L (ref 32–182)

## 2019-12-10 LAB — HEPATITIS A ANTIBODY, TOTAL: hep A Total Ab: NEGATIVE

## 2019-12-10 LAB — MITOCHONDRIAL/SMOOTH MUSCLE AB PNL
Mitochondrial Ab: <20 Units (ref 0.0–20.0)
Smooth Muscle Ab: 7 Units (ref 0–19)

## 2019-12-10 LAB — ALPHA-1-ANTITRYPSIN: A-1 Antitrypsin: 208 mg/dL — ABNORMAL HIGH (ref 101–187)

## 2019-12-10 LAB — HEPATITIS B E ANTIBODY: Hep B E Ab: NEGATIVE

## 2019-12-10 LAB — HEPATITIS B E ANTIGEN: Hep B E Ag: NEGATIVE

## 2019-12-10 LAB — ANA: Anti Nuclear Antibody (ANA): NEGATIVE

## 2019-12-12 ENCOUNTER — Telehealth: Payer: Self-pay

## 2019-12-12 NOTE — Telephone Encounter (Signed)
Inform patient that she is not immune to hepatitis A and B and needs a vaccine.  Her iron studies suggest that the ferritin has improved from what it was previously.  Suggest to repeat it in 3 to 4 months to ensure that it is heading in the right direction.  Other test for work-up of liver disease was negative.  If possible that her liver disease is secondary to nonalcoholic fatty liver disease.  I suggest that we await the results of her ultrasound and return to the office to discuss further.

## 2019-12-12 NOTE — Telephone Encounter (Signed)
Called pt to inform her of Dr. Georgeann Oppenheim recommendations.  Unable to contact, LVM to return call

## 2019-12-12 NOTE — Telephone Encounter (Signed)
Pt called to request clarification of recent lab results. I explained that Dr. Vicente Males has not reviewed the results yet but once he reviews and gives his recommendations we will contact her to inform. She understands and agrees.

## 2019-12-14 ENCOUNTER — Ambulatory Visit (INDEPENDENT_AMBULATORY_CARE_PROVIDER_SITE_OTHER): Payer: Medicaid Other | Admitting: Psychiatry

## 2019-12-14 ENCOUNTER — Other Ambulatory Visit: Payer: Self-pay

## 2019-12-14 ENCOUNTER — Encounter: Payer: Self-pay | Admitting: Psychiatry

## 2019-12-14 DIAGNOSIS — F3173 Bipolar disorder, in partial remission, most recent episode manic: Secondary | ICD-10-CM

## 2019-12-14 DIAGNOSIS — Z634 Disappearance and death of family member: Secondary | ICD-10-CM

## 2019-12-14 DIAGNOSIS — F5105 Insomnia due to other mental disorder: Secondary | ICD-10-CM | POA: Diagnosis not present

## 2019-12-14 MED ORDER — CITALOPRAM HYDROBROMIDE 20 MG PO TABS: 20.0000 mg | ORAL_TABLET | Freq: Every day | ORAL | 0 refills | Status: DC

## 2019-12-14 MED ORDER — MIRTAZAPINE 7.5 MG PO TABS: 7.5000 mg | ORAL_TABLET | Freq: Every day | ORAL | 1 refills | Status: DC

## 2019-12-14 NOTE — Progress Notes (Signed)
Provider Location : [facility] Patient Location : Home   Virtual Visit via Video Note  I connected with Margaret [name] on 12/14/19 at  1:20 PM EST by a video enabled telemedicine application and verified that I am speaking with the correct person using two identifiers.   I discussed the limitations of evaluation and management by telemedicine and the availability of in person appointments. The patient expressed understanding and agreed to proceed.   I discussed the assessment and treatment plan with the patient. The patient was provided an opportunity to ask questions and all were answered. The patient agreed with the plan and demonstrated an understanding of the instructions.   The patient was advised to call back or seek an in-person evaluation if the symptoms worsen or if the condition fails to improve as anticipated.   [facility] MD OP Progress Note  12/14/2019 5:09 PM Margaret [name]  MRN:  [id number]  Chief Complaint:  Chief Complaint    Follow-up     HPI: Margaret [name] is a 64 year old Caucasian female, disability, lives in [place], divorced, has a history of bipolar disorder, hyperlipidemia, insomnia, diabetes melitis, chronic pain, hypertension was evaluated by telemedicine today.  Patient today reports she is currently grieving the loss of her mother.  Her mother passed away on 12/20/2022.  She reports she does not know what she feels at this time.  She has been keeping herself busy taking care of her mother's estate.  She reports she has been distracting herself and is trying to be there for everybody else however has not taken time for herself to think about how she may be feeling.  She reports she however has been struggling with sleep issues since her mother passed.  Patient denies any suicidality, homicidality or perceptual disturbances.  Patient reports she has good support system from her friends.  Patient denies any other concerns today. Visit Diagnosis:    ICD-10-CM   1. Bipolar  disorder, in partial remission, most recent episode manic (HCC)  F31.73 citalopram (CELEXA) 20 MG tablet  2. Bereavement  Z63.4 citalopram (CELEXA) 20 MG tablet  3. Insomnia due to mental condition  F51.05 mirtazapine (REMERON) 7.5 MG tablet    Past Psychiatric History: I have reviewed past psychiatric history from my progress note on 08/21/2019.  Past trials of Depakote, lithium, risperidone, Xanax.  Past Medical History:  Past Medical History:  Diagnosis Date  . Bipolar 1 disorder ([place])   . Diabetes mellitus without complication (HCC)    Type 2  . Hypertension     Past Surgical History:  Procedure Laterality Date  . ABDOMINAL HYSTERECTOMY    . CHOLECYSTECTOMY    . COLONOSCOPY WITH PROPOFOL N/A 06/23/2019   Procedure: COLONOSCOPY WITH PROPOFOL;  Surgeon: Jonathon Bellows, MD;  Location: [facility] ENDOSCOPY;  Service: Gastroenterology;  Laterality: N/A;  . ESOPHAGOGASTRODUODENOSCOPY (EGD) WITH PROPOFOL N/A 06/23/2019   Procedure: ESOPHAGOGASTRODUODENOSCOPY (EGD) WITH PROPOFOL;  Surgeon: Jonathon Bellows, MD;  Location[facility] ENDOSCOPY;  Service: Gastroenterology;  Laterality: N/A;  . TONSILLECTOMY      Family Psychiatric History: I have reviewed family psychiatric history from my progress note on 08/21/2019.  Family History:  Family History  Problem Relation Age of Onset  . Mental illness Father   . Bipolar disorder Brother   . Bipolar disorder Paternal Uncle   . Bipolar disorder Paternal Uncle     Social History: Reviewed social history from my progress note on 08/21/2019. Social History   Socioeconomic History  . Marital status: Divorced  Spouse name: Not on file  . Number of children: 2  . Years of education: Not on file  . Highest education level: Not on file  Occupational History  . Not on file  Tobacco Use  . Smoking status: Former Smoker    Types: Cigarettes    Quit date: 03/03/2011    Years since quitting: 8.7  . Smokeless tobacco: Never Used  Substance and Sexual Activity   . Alcohol use: Not Currently  . Drug use: Not Currently  . Sexual activity: Not on file  Other Topics Concern  . Not on file  Social History Narrative  . Not on file   Social Determinants of Health   Financial Resource Strain:   . Difficulty of Paying Living Expenses: Not on file  Food Insecurity:   . Worried About Charity fundraiser in the Last Year: Not on file  . Ran Out of Food in the Last Year: Not on file  Transportation Needs:   . Lack of Transportation (Medical): Not on file  . Lack of Transportation (Non-Medical): Not on file  Physical Activity:   . Days of Exercise per Week: Not on file  . Minutes of Exercise per Session: Not on file  Stress:   . Feeling of Stress : Not on file  Social Connections:   . Frequency of Communication with Friends and Family: Not on file  . Frequency of Social Gatherings with Friends and Family: Not on file  . Attends Religious Services: Not on file  . Active Member of Clubs or Organizations: Not on file  . Attends Archivist Meetings: Not on file  . Marital Status: Not on file    Allergies:  Allergies  Allergen Reactions  . Penicillins Hives    Resulted in hospitalization  . Sulfa Antibiotics Hives    Resulted in hospitalization    Metabolic Disorder Labs: Lab Results  Component Value Date   HGBA1C 7.3 (H) 06/22/2019   MPG 162.81 06/22/2019   No results found for: PROLACTIN No results found for: CHOL, TRIG, HDL, CHOLHDL, VLDL, LDLCALC Lab Results  Component Value Date   TSH 1.737 11/16/2019    Therapeutic Level Labs: No results found for: LITHIUM No results found for: VALPROATE No components found for:  CBMZ  Current Medications: Current Outpatient Medications  Medication Sig Dispense Refill  . amLODipine (NORVASC) 10 MG tablet Take by mouth.    . losartan (COZAAR) 50 MG tablet Take by mouth.    . meloxicam (MOBIC) 7.5 MG tablet TAKE 1 TABLET BY MOUTH EVERY DAY    . acetaminophen (TYLENOL) 500 MG  tablet Take 500 mg by mouth every 6 (six) hours as needed.     Marland Kitchen alendronate (FOSAMAX) 35 MG tablet Take 35 mg by mouth every 7 (seven) days. Take with a full glass of water on an empty stomach.    Marland Kitchen amLODipine (NORVASC) 10 MG tablet Take 10 mg by mouth daily.    . ARIPiprazole (ABILIFY) 5 MG tablet Take 1 tablet (5 mg total) by mouth daily. (Patient taking differently: Take 10 mg by mouth daily. ) 30 tablet 1  . B Complex Vitamins (VITAMIN B-COMPLEX) TABS Take 1 tablet by mouth daily.     . B Complex-C (B-COMPLEX WITH VITAMIN C) tablet Take 1 tablet by mouth daily. 30 tablet 2  . cetirizine (ZYRTEC) 10 MG tablet Take 10 mg by mouth daily.    . citalopram (CELEXA) 20 MG tablet Take 1 tablet (20 mg total)  by mouth daily. 90 tablet 0  . Dulaglutide (TRULICITY) [id number] [id number] SOPN Inject 0.75 mg into the skin once a week. Thursday    . Ertugliflozin L-PyroglutamicAc (STEGLATRO) 15 MG TABS Take by mouth.    . ferrous sulfate 325 (65 FE) MG tablet Take 1 tablet (325 mg total) by mouth 2 (two) times daily with a meal. 120 tablet 0  . fluconazole (DIFLUCAN) 100 MG tablet Take 100 mg by mouth daily.    Marland Kitchen gabapentin (NEURONTIN) 600 MG tablet Take 1 tablet (600 mg total) by mouth 3 (three) times daily. 270 tablet 0  . glimepiride (AMARYL) 2 MG tablet Take 2 mg by mouth daily with breakfast.     . hydrochlorothiazide (HYDRODIURIL) 25 MG tablet Take 25 mg by mouth daily.     Marland Kitchen HYDROcodone-acetaminophen (NORCO) 10-325 MG tablet Take 1 tablet by mouth 3 (three) times daily.    . Insulin Glargine (LANTUS SOLOSTAR) 100 UNIT/ML Solostar Pen Inject 60 Units into the skin at bedtime.     . lamoTRIgine (LAMICTAL) 200 MG tablet Take 1 tablet (200 mg total) by mouth daily. 90 tablet 0  . LANTUS SOLOSTAR 100 UNIT/ML Solostar Pen SMARTSIG:60 Unit(s) SUB-Q Every Night    . lisinopril (ZESTRIL) 20 MG tablet Take 20 mg by mouth daily.    Marland Kitchen losartan (COZAAR) 50 MG tablet Take 50 mg by mouth daily.    . meclizine (ANTIVERT)  25 MG tablet Take 25 mg by mouth 2 (two) times daily as needed.    . metFORMIN (GLUCOPHAGE) 1000 MG tablet Take 1,000 mg by mouth 2 (two) times daily.    . metFORMIN (GLUCOPHAGE) 500 MG tablet Take 500 mg by mouth 2 (two) times daily with a meal.    . mirtazapine (REMERON) 7.5 MG tablet Take 1 tablet (7.5 mg total) by mouth at bedtime. Mood and sleep 30 tablet 1  . MOVANTIK 25 MG TABS tablet Take 25 mg by mouth daily.    Marland Kitchen NOVOFINE 32G X 6 MM MISC USE TWICE DAILY ASADIRECTEDO    . nystatin-triamcinolone (MYCOLOG II) cream Apply topically 2 (two) times daily. (Patient not taking: Reported on 09/19/2019) 30 g 0  . olmesartan (BENICAR) 20 MG tablet Take 20 mg by mouth daily.     . pantoprazole (PROTONIX) 40 MG tablet Take 40 mg by mouth daily.    . propranolol (INDERAL) 10 MG tablet TAKE 1 TABLET BY MOUTH 3 TIMES DAILY AS NEEDED. FOR SEVERE ANXIETY ATTACKS ONLY 90 tablet 0  . VICTOZA 18 MG/3ML SOPN Inject 1.2 mg into the skin daily.     No current facility-administered medications for this visit.     Musculoskeletal: Strength & Muscle Tone: UTA Gait & Station: normal Patient leans: N/A  Psychiatric Specialty Exam: Review of Systems  Psychiatric/Behavioral: Positive for sleep disturbance. The patient is nervous/anxious.   All other systems reviewed and are negative.   There were no vitals taken for this visit.There is no height or weight on file to calculate BMI.  General Appearance: Casual  Eye Contact:  Fair  Speech:  Clear and Coherent  Volume:  Normal  Mood:  Anxious  Affect:  Congruent  Thought Process:  Goal Directed and Descriptions of Associations: Intact  Orientation:  Full (Time, Place, and Person)  Thought Content: Logical   Suicidal Thoughts:  No  Homicidal Thoughts:  No  Memory:  Immediate;   Fair Recent;   Fair Remote;   Fair  Judgement:  Fair  Insight:  Fair  Psychomotor Activity:  Normal  Concentration:  Concentration: Fair and Attention Span: Fair  Recall:   [employer] of Knowledge: Fair  Language: Fair  Akathisia:  No  Handed:  Right  AIMS (if indicated): Denies tremors, rigidity  Assets:  Communication Skills Desire for Improvement Housing Social Support  ADL's:  Intact  Cognition: WNL  Sleep:  Poor   Screenings:   Assessment and Plan: Margaret [name] is a 64 year old Caucasian female on disability, divorced, lives in [place], has a history of bipolar disorder, chronic pain, diabetes melitis, hypertension, hyperlipidemia was evaluated by telemedicine today.  She is biologically predisposed given her family history as well as history of trauma.  Patient with psychosocial stressors of her mother's death and the current pandemic.  Patient will benefit from medication readjustment due to her sleep issues.  Plan as noted below.  Plan Bipolar disorder type I manic in partial remission Abilify at reduced dose to 5 mg p.o. daily.  She did not tolerate the higher dosage. Lamictal 200 mg p.o. daily Gabapentin 600 mg p.o. 3 times daily Celexa as prescribed  Insomnia likely due to mental health problems, pain as well as recent grief. Reduce Celexa to 20 mg and add Remeron 7.5 mg at bedtime for sleep   Bereavement-unstable Patient is currently not interested in referral for psychotherapy sessions.  She will talk to her friends and make use of her support system.  Due to connection problem writer had to leave a message for patient to call back to schedule an appointment in 2 to 3 weeks from now.  I have spent atleast 20 minutes non face to face with patient today. More than 50 % of the time was spent for ordering medications and test ,psychoeducation and supportive psychotherapy and care coordination,as well as documenting clinical information in electronic health record. This note was generated in part or whole with voice recognition software. Voice recognition is usually quite accurate but there are transcription errors that can and very often do  occur. I apologize for any typographical errors that were not detected and corrected.         Ursula Alert, MD 12/14/2019, 5:09 PM

## 2019-12-18 ENCOUNTER — Telehealth: Payer: Self-pay | Admitting: Gastroenterology

## 2019-12-18 NOTE — Telephone Encounter (Signed)
This scan is approved. Informed pre service center

## 2019-12-18 NOTE — Telephone Encounter (Signed)
Gabriel Cirri called with [facility] stated this patient is having an u/s abd complete an has Medicaid needs prior authorization.

## 2019-12-20 ENCOUNTER — Other Ambulatory Visit: Payer: Self-pay

## 2019-12-20 ENCOUNTER — Ambulatory Visit
Admission: RE | Admit: 2019-12-20 | Discharge: 2019-12-20 | Disposition: A | Discharge status: Home / Self Care | Payer: Medicaid Other | Source: Ambulatory Visit | Attending: Gastroenterology | Admitting: Gastroenterology

## 2019-12-20 DIAGNOSIS — R188 Other ascites: Secondary | ICD-10-CM | POA: Diagnosis present

## 2019-12-20 DIAGNOSIS — K7460 Unspecified cirrhosis of liver: Secondary | ICD-10-CM | POA: Insufficient documentation

## 2019-12-20 IMAGING — US US ABDOMEN COMPLETE
1 series · 13 of 25 positions shown · non-contrast
Comparison: [DATE].

CLINICAL DATA: History of cirrhosis

EXAM:
ABDOMEN ULTRASOUND COMPLETE

[Series 1: us abdomen complete · 13 of 88 slices shown]
[im 1/88]
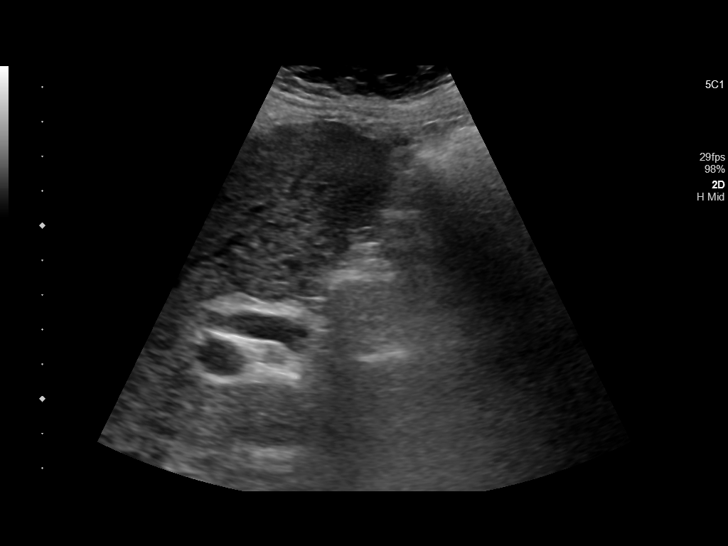
[im 8/88]
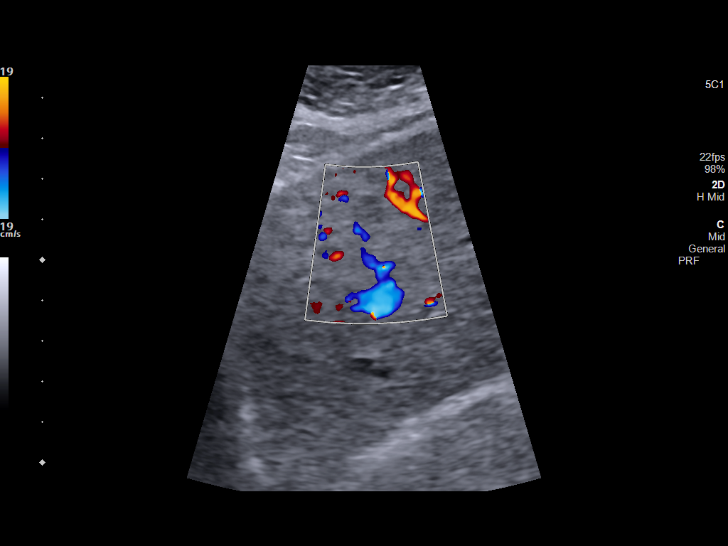
[im 15/88]
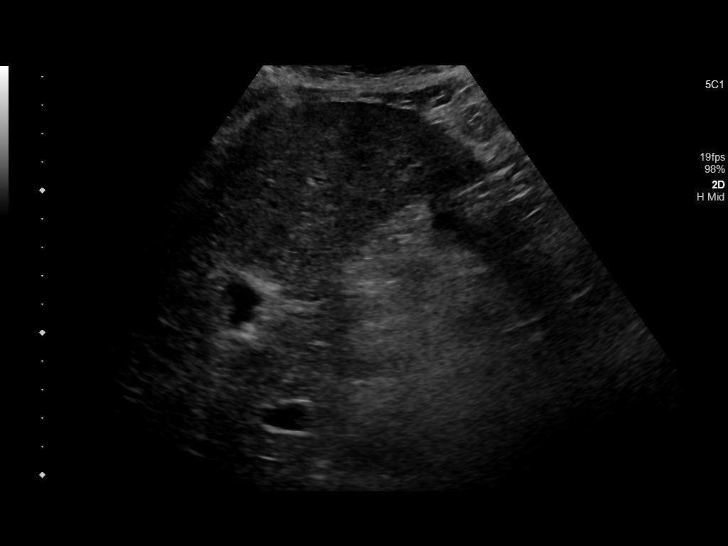
[im 22/88]
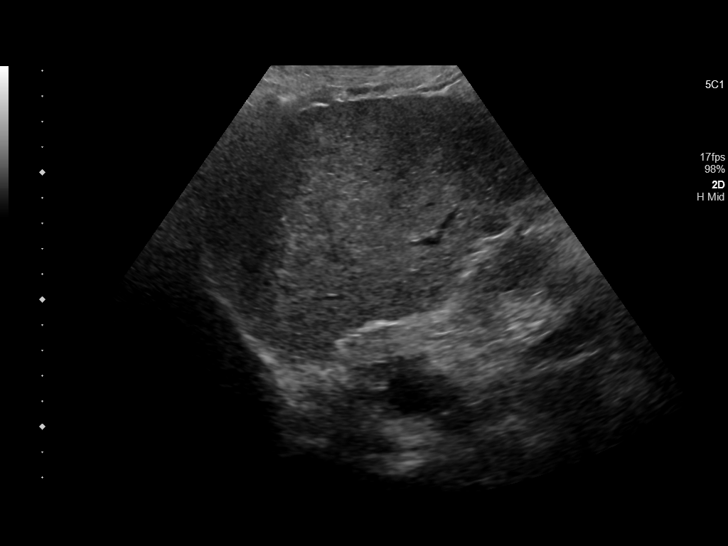
[im 30/88]
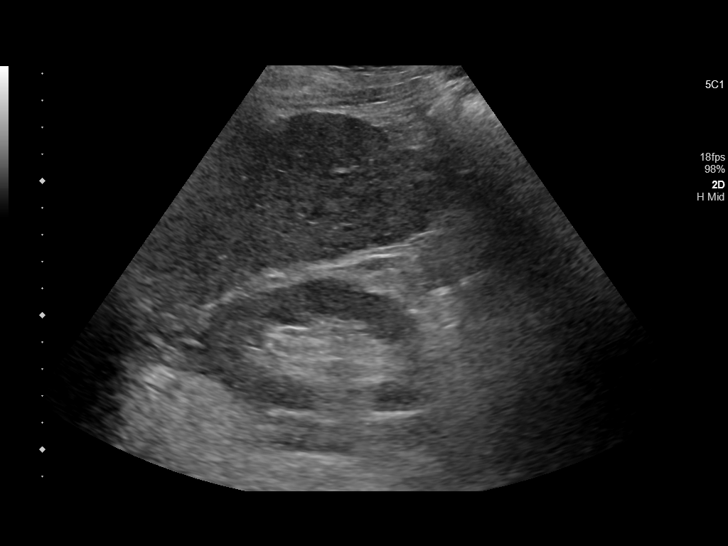
[im 37/88]
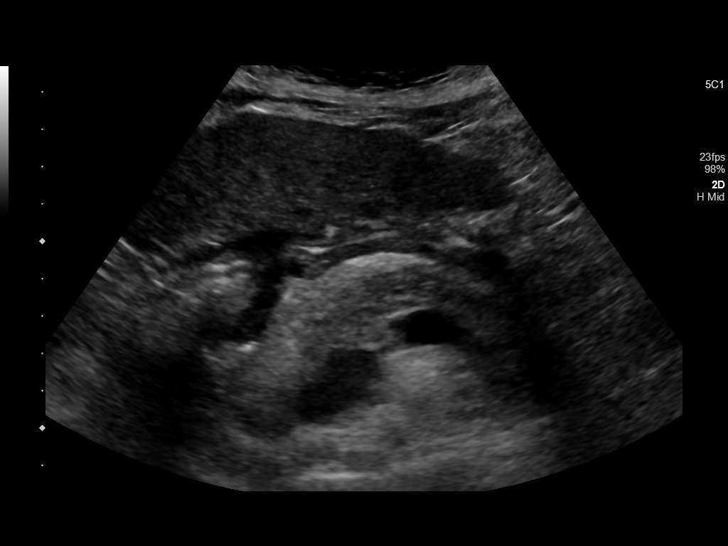
[im 44/88]
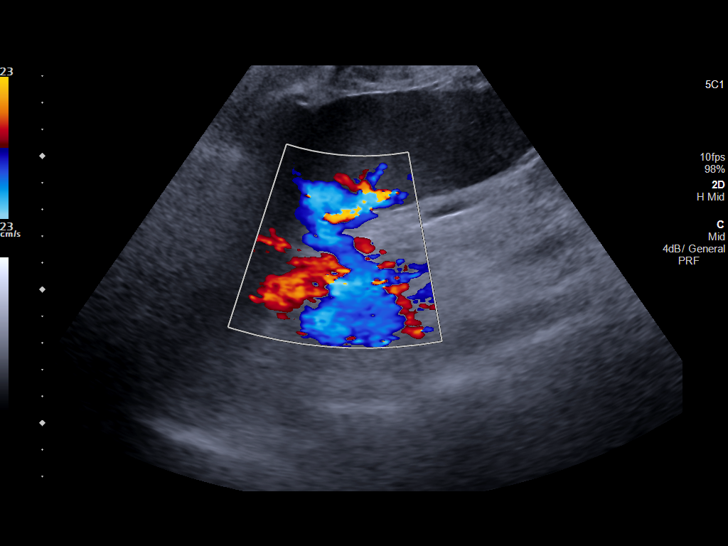
[im 51/88]
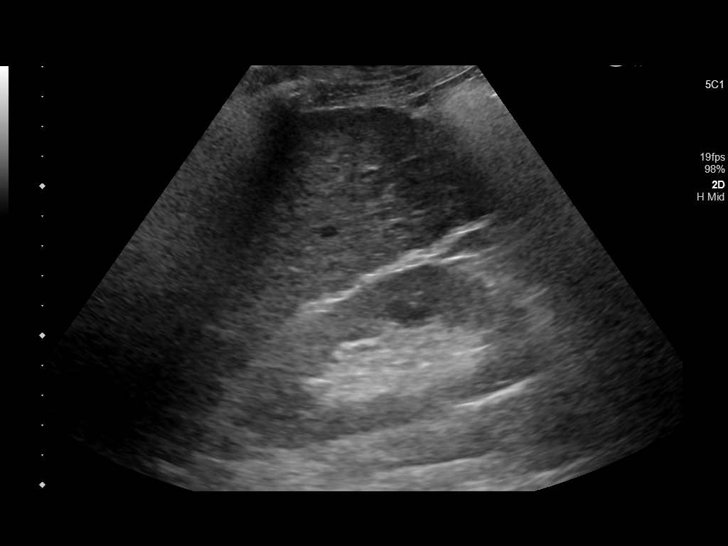
[im 59/88]
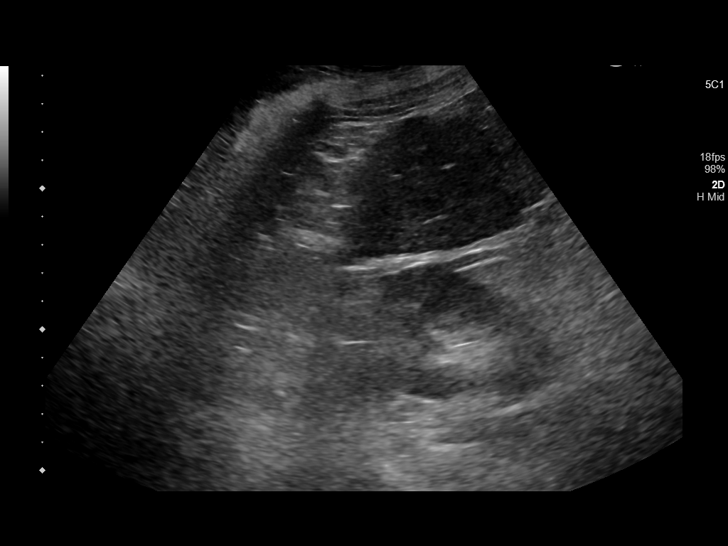
[im 66/88]
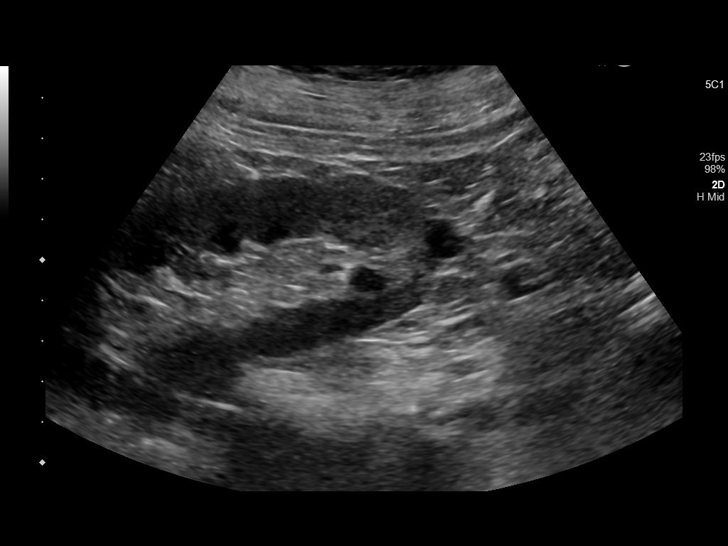
[im 73/88]
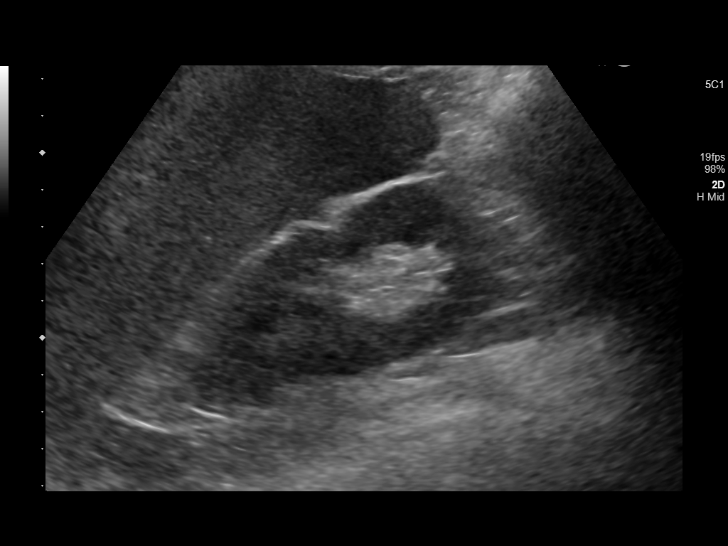
[im 80/88]
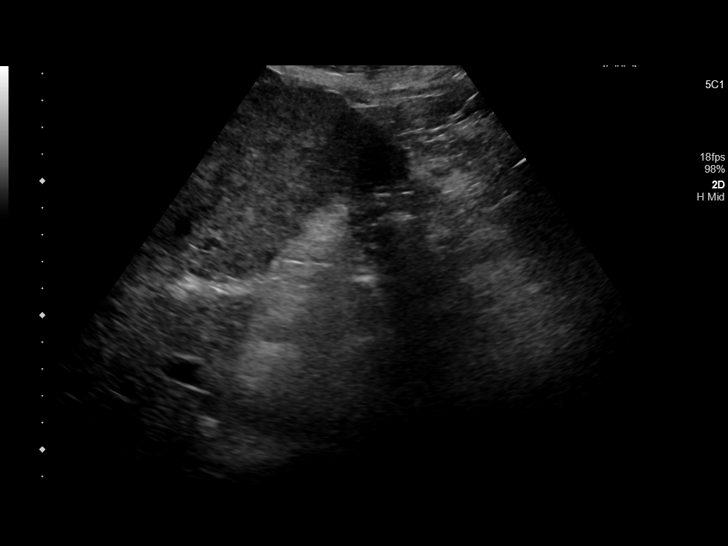
[im 88/88]
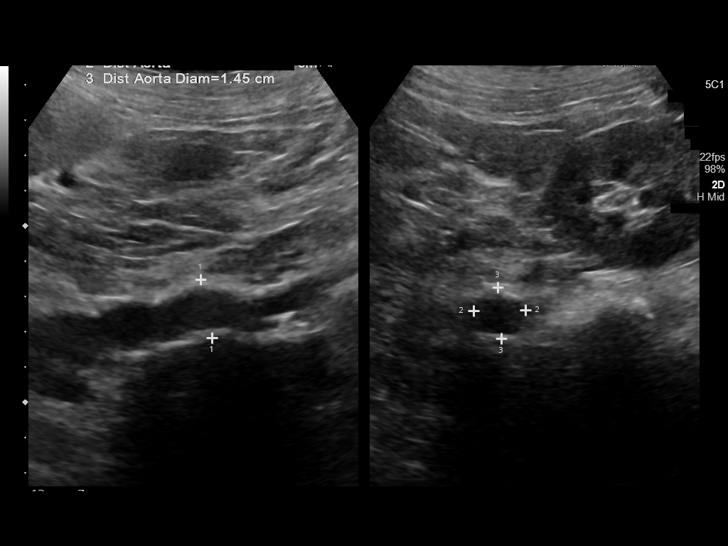

[13 of 25 positions shown; findings below may reference images not displayed]

FINDINGS: Gallbladder: The patient is status post cholecystectomy

Common bile duct: Diameter:

1.8 mm

Liver: There is a heterogeneous echotexture with nodular contour to
the liver parenchyma. There is question of a 1 cm hypoechoic nodule
within the left liver lobe. No internal vascularity seen within this
lesion. Portal vein is patent on color Doppler imaging with normal
direction of blood flow towards the liver.

IVC: No abnormality visualized.

Pancreas: Visualized portion unremarkable.

Spleen: The spleen is enlarged measuring 16.7 cm. No focal splenic
lesion.

Right Kidney: Length: 10.6 cm. Echogenicity within normal limits. No
mass or hydronephrosis visualized.

Left Kidney: Length: 11.3 cm. Echogenicity within normal limits.
There is a 9 mm anechoic cyst seen within the left kidney.

Abdominal aorta: No aneurysm visualized. Calcified atherosclerotic
plaque is seen.

Other findings: None.
IMPRESSION: Nonspecific 1 cm hypoechoic nodule within the left liver lobe. For
further evaluation would recommend CT liver mass protocol or MRI
with contrast.

Findings consistent with cirrhosis and splenomegaly

## 2019-12-24 ENCOUNTER — Encounter: Payer: Self-pay | Admitting: Gastroenterology

## 2019-12-25 ENCOUNTER — Telehealth: Payer: Self-pay

## 2019-12-25 DIAGNOSIS — F3110 Bipolar disorder, current episode manic without psychotic features, unspecified: Secondary | ICD-10-CM

## 2019-12-25 MED ORDER — LAMOTRIGINE 200 MG PO TABS: 200.0000 mg | ORAL_TABLET | Freq: Every day | ORAL | 0 refills | Status: DC

## 2019-12-25 NOTE — Telephone Encounter (Signed)
pt called asked for a refill on lamictal and also wanted to know if you can give her a rx for movantix states she does not see the doctor that gave her that medication anymore.

## 2019-12-25 NOTE — Telephone Encounter (Signed)
We will sent Lamictal to her pharmacy for 90-day supply.  I will not be able to prescribe her Movantik-she needs to talk to her provider who prescribes it to her.

## 2019-12-27 ENCOUNTER — Telehealth: Payer: Self-pay

## 2019-12-27 DIAGNOSIS — K769 Liver disease, unspecified: Secondary | ICD-10-CM

## 2019-12-27 NOTE — Telephone Encounter (Signed)
-----   Message from Jonathon Bellows, MD sent at 12/24/2019  9:33 AM EST ----- Sherald Hess inform  1. Liver USG shows 1 cm lesion ine the liver- need MRI to get more information about it - please schedule  C/c Rusty Aus, MD   Dr Jonathon Bellows MD,MRCP [facility]) Gastroenterology/Hepatology Pager: [phone]

## 2019-12-27 NOTE — Telephone Encounter (Signed)
Patient verbalized understanding of results. Order MRI for patient. Patient is scheduled  On 01/06/2020 arrived at 12:30pm for a 1:00 appointment. It is at the [facility]. Nothing to eat or drank for 4 hours prior to the procedure

## 2020-01-03 ENCOUNTER — Other Ambulatory Visit: Payer: Self-pay

## 2020-01-03 MED ORDER — ALPRAZOLAM 0.5 MG PO TBDP: 0.5000 mg | ORAL_TABLET | Freq: Once | ORAL | 0 refills | Status: AC

## 2020-01-06 ENCOUNTER — Ambulatory Visit
Admission: RE | Admit: 2020-01-06 | Discharge: 2020-01-06 | Disposition: A | Discharge status: Home / Self Care | Payer: Medicaid Other | Source: Ambulatory Visit | Attending: Gastroenterology | Admitting: Gastroenterology

## 2020-01-06 ENCOUNTER — Other Ambulatory Visit: Payer: Self-pay

## 2020-01-06 DIAGNOSIS — K769 Liver disease, unspecified: Secondary | ICD-10-CM | POA: Diagnosis present

## 2020-01-06 IMAGING — MR MR ABDOMEN WO/W CM
18 series · 48 of 48 positions shown · IV contrast (gadavist)
Comparison: Ultrasound on [DATE] and CT on [DATE]

CLINICAL DATA: Cirrhosis. Indeterminate liver lesion on recent
ultrasound.

EXAM:
MRI ABDOMEN WITHOUT AND WITH CONTRAST
TECHNIQUE: Multiplanar multisequence MR imaging of the abdomen was performed
both before and after the administration of intravenous contrast.
CONTRAST:  10mL GADAVIST GADOBUTROL 1 MMOL/ML IV SOLN

[Series 2: T2 · coronal · 6.0mm · 1.19mm/px · 3 of 40 slices shown (1 of 2)]
[im 1/40]
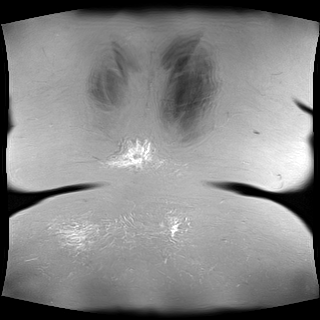
[im 20/40]
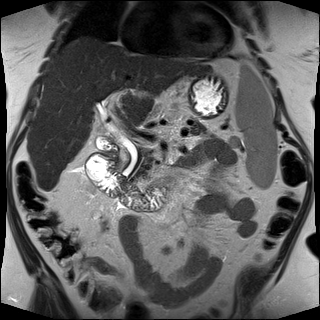
[im 40/40]
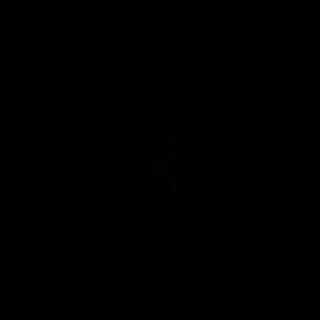

[Series 3: T2 · axial · 6.0mm · 1.19mm/px · z∈[-62,+204]mm · 2 of 38 slices shown (2 of 2)]
[im 1/38]
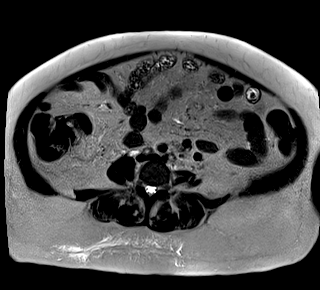
[im 38/38]
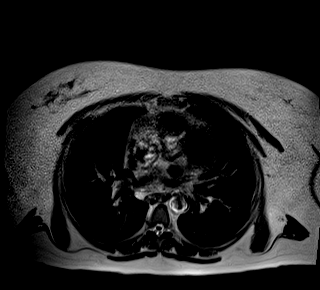

[Series 5: T2 fat-sat · axial · 6.0mm · 1.19mm/px · z∈[-53,+213]mm · 2 of 38 slices shown]
[im 1/38]
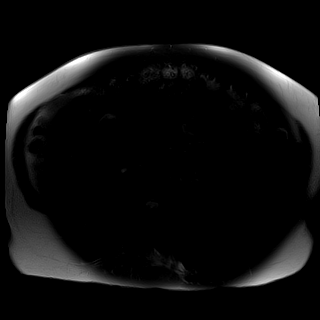
[im 38/38]
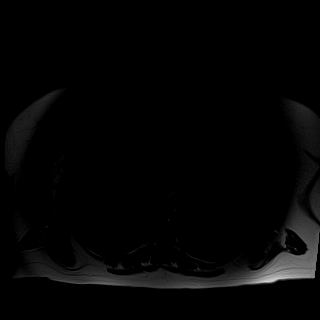

[Series 6: ax dwi_tracew · axial · 6.0mm · 1.42mm/px · z∈[-53,+213]mm · 5 of 114 slices shown]
[im 1/114]
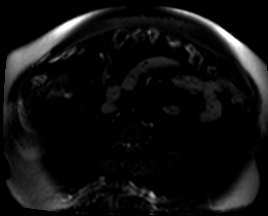
[im 29/114]
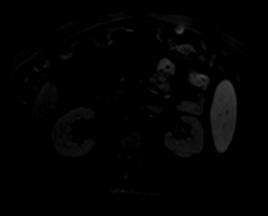
[im 57/114]
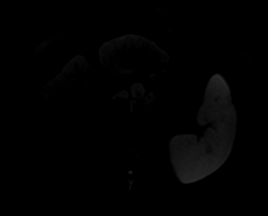
[im 85/114]
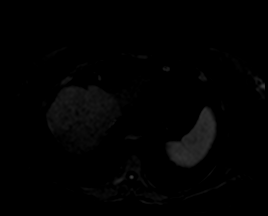
[im 114/114]
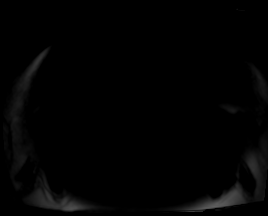

[Series 7: ax dwi_adc · axial · 6.0mm · 1.42mm/px · z∈[-53,+213]mm · 2 of 38 slices shown]
[im 1/38]
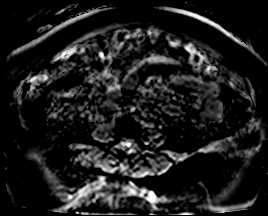
[im 38/38]
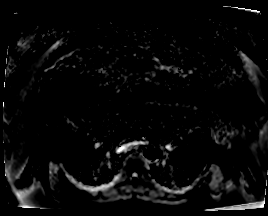

[Series 8: T1 · axial · 6.0mm · 0.74mm/px · z∈[-62,+204]mm · 2 of 38 slices shown (1 of 2)]
[im 1/38]
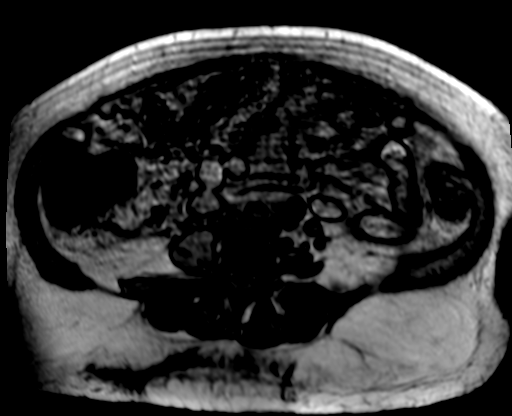
[im 38/38]
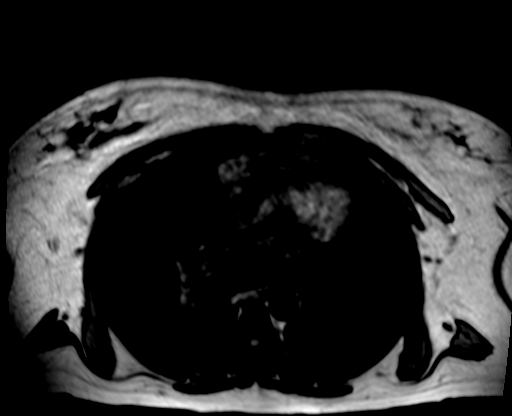

[Series 8: T1 · axial · 6.0mm · 0.74mm/px · 1 of 38 slices shown (2 of 2)]
[im 1/38]
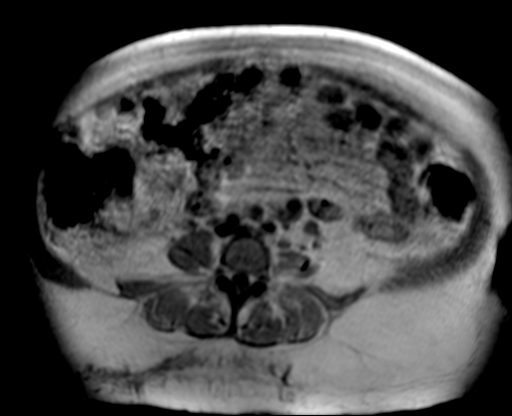

[Series 9: bSSFP · axial · 6.0mm · 0.74mm/px · 1 of 38 slices shown]
[im 1/38]
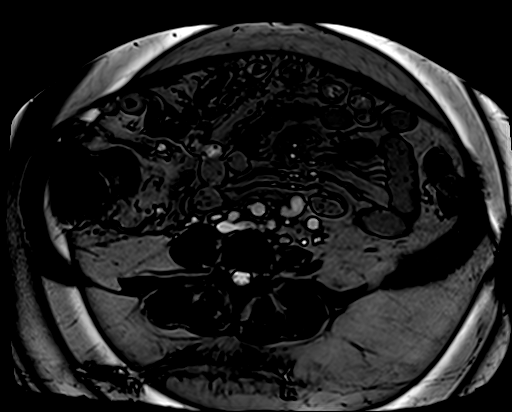

[Series 10: T1 dynamic fat-sat · axial · non-contrast · 3.0mm · 1.19mm/px · z∈[-54,+207]mm · 3 of 88 slices shown (1 of 5)]
[im 1/88]
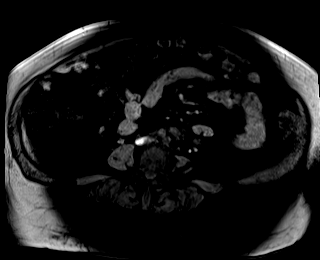
[im 44/88]
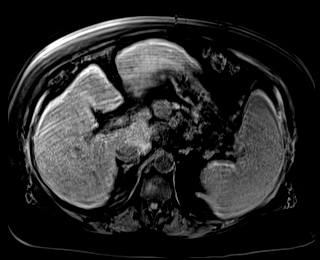
[im 88/88]
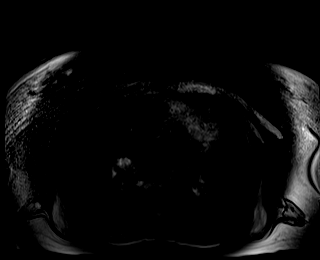

[Series 11: T1 dynamic fat-sat post-contrast · axial · 3.0mm · 1.19mm/px · z∈[-54,+207]mm · 3 of 88 slices shown (1 of 4)]
[im 1/88]
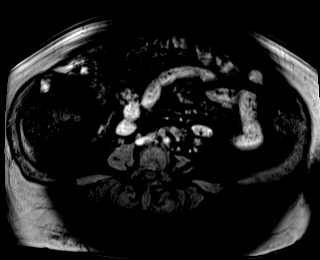
[im 44/88]
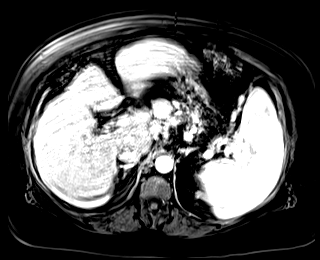
[im 88/88]
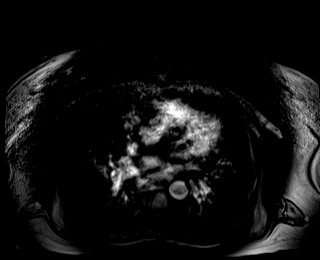

[Series 12: T1 dynamic fat-sat · axial · 3.0mm · 1.19mm/px · z∈[-54,+207]mm · 3 of 88 slices shown (2 of 5)]
[im 1/88]
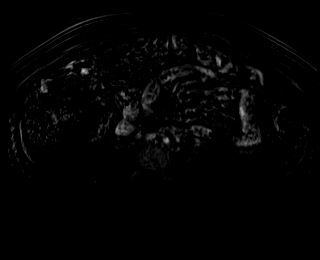
[im 44/88]
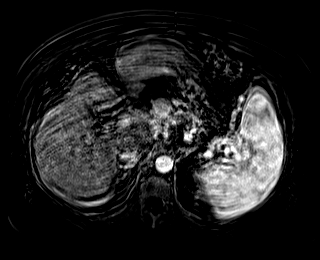
[im 88/88]
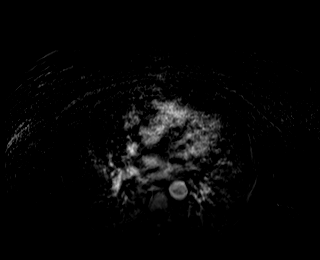

[Series 13: T1 dynamic fat-sat post-contrast · axial · 3.0mm · 1.19mm/px · z∈[-54,+207]mm · 3 of 88 slices shown (2 of 4)]
[im 1/88]
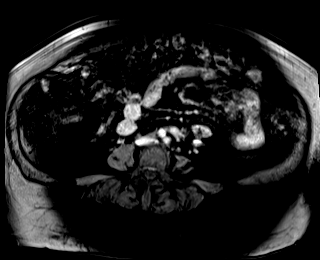
[im 44/88]
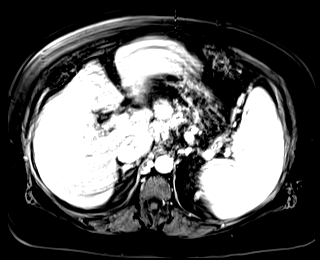
[im 88/88]
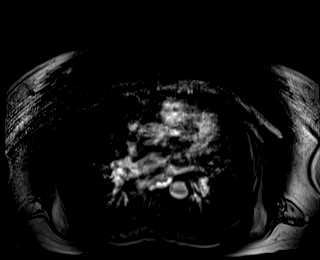

[Series 14: T1 dynamic fat-sat · axial · 3.0mm · 1.19mm/px · z∈[-54,+207]mm · 3 of 88 slices shown (3 of 5)]
[im 1/88]
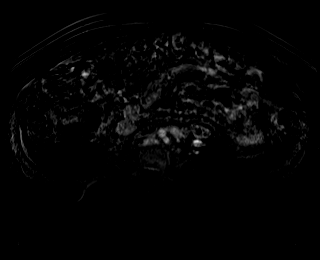
[im 44/88]
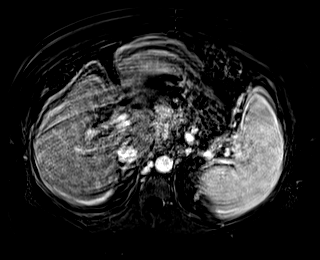
[im 88/88]
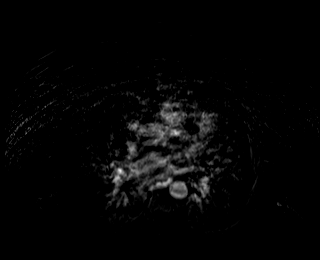

[Series 15: T1 dynamic fat-sat post-contrast · axial · 3.0mm · 1.19mm/px · z∈[-54,+207]mm · 3 of 88 slices shown (3 of 4)]
[im 1/88]
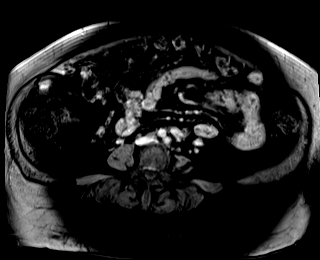
[im 44/88]
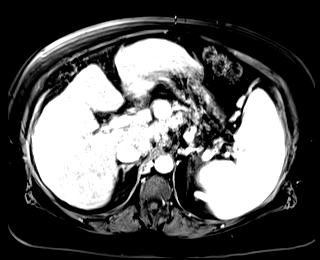
[im 88/88]
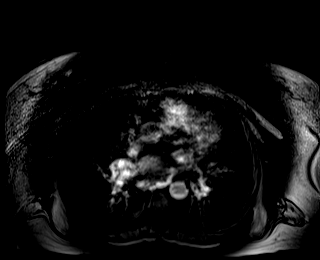

[Series 16: T1 dynamic fat-sat · axial · 3.0mm · 1.19mm/px · z∈[-54,+207]mm · 3 of 88 slices shown (4 of 5)]
[im 1/88]
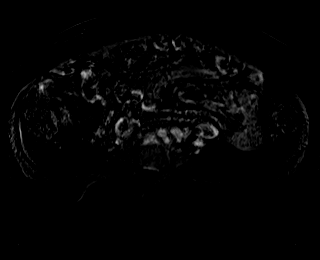
[im 44/88]
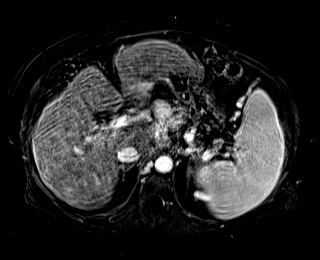
[im 88/88]
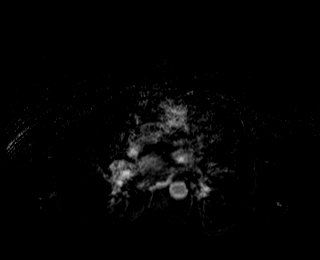

[Series 18: T1 dynamic fat-sat post-contrast · axial · 3.0mm · 1.19mm/px · z∈[-54,+207]mm · 3 of 88 slices shown (4 of 4)]
[im 1/88]
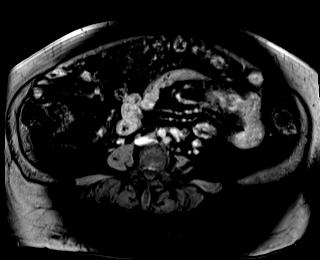
[im 44/88]
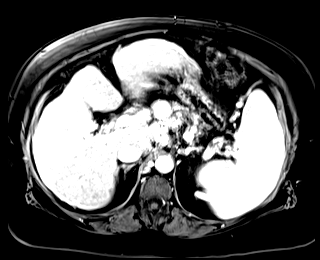
[im 88/88]
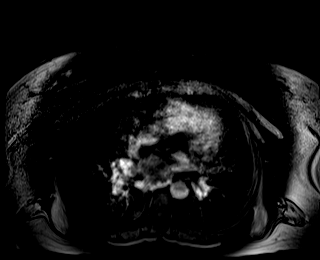

[Series 19: T1 dynamic fat-sat · axial · 3.0mm · 1.19mm/px · z∈[-54,+207]mm · 3 of 88 slices shown (5 of 5)]
[im 1/88]
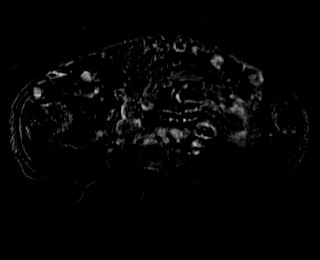
[im 44/88]
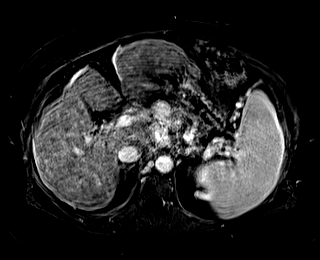
[im 88/88]
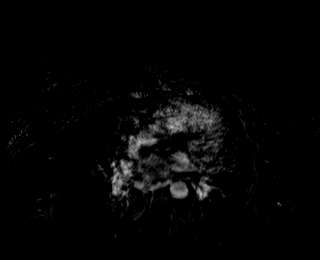

[Series 20: T1 dynamic post-contrast · coronal · 3.0mm · 1.31mm/px · 3 of 88 slices shown]
[im 1/88]
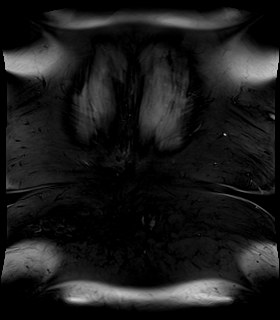
[im 44/88]
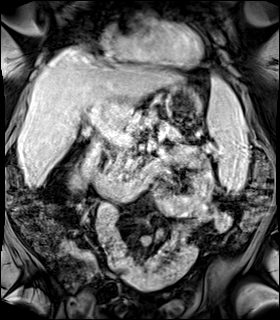
[im 88/88]
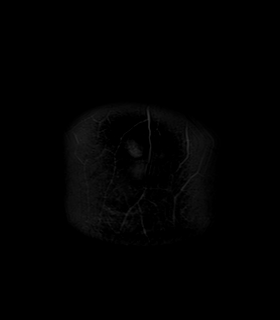

[48 of 48 positions shown; findings below may reference images not displayed]

FINDINGS: Lower chest: No acute findings.

Hepatobiliary: Image degradation by motion artifact noted. Hepatic
cirrhosis again demonstrated. No liver masses identified. Prior
cholecystectomy noted. No evidence of biliary obstruction.

Pancreas:  No mass or inflammatory changes.

Spleen: Moderate splenomegaly again seen measuring approximately 16
cm in length. No splenic masses identified.

Adrenals/Urinary Tract: No masses identified. A few tiny sub-cm
renal cysts are noted bilaterally. No evidence of hydronephrosis.

Stomach/Bowel: Visualized portion unremarkable.

Vascular/Lymphatic: Mild lymphadenopathy is seen in the
gastrohepatic ligament, porta hepatis, portacaval space, likely
reactive in etiology.

Other:  None.

Musculoskeletal:  No suspicious bone lesions identified.
IMPRESSION: 1. Image degradation by motion artifact noted. Hepatic cirrhosis. No
evidence of hepatic neoplasm.
2. Moderate splenomegaly, consistent with portal venous
hypertension.
3. Mild lymphadenopathy in gastrohepatic ligament, porta hepatis,
and portacaval space, likely reactive in etiology.

## 2020-01-06 MED ORDER — GADOBUTROL 1 MMOL/ML IV SOLN
8.0000 mL | Freq: Once | INTRAVENOUS | Status: AC | PRN
  Administered 2020-01-06: 10 mL via INTRAVENOUS

## 2020-01-09 ENCOUNTER — Encounter: Payer: Self-pay | Admitting: Gastroenterology

## 2020-01-10 ENCOUNTER — Telehealth: Payer: Self-pay

## 2020-01-10 NOTE — Telephone Encounter (Signed)
Called pt to inform her of MRI results.  Unable to contact, LVM to return call

## 2020-01-10 NOTE — Telephone Encounter (Signed)
-----   Message from Jonathon Bellows, MD sent at 01/09/2020 12:06 PM EST ----- Margaret [name]   Inform no liver mass seen.  Features of cirrhosis noted.   Dr Jonathon Bellows MD,MRCP [facility]) Gastroenterology/Hepatology Pager: [phone]

## 2020-01-10 NOTE — Telephone Encounter (Signed)
Spoke with pt and informed her of MRI results.

## 2020-01-11 ENCOUNTER — Other Ambulatory Visit: Payer: Self-pay

## 2020-01-11 ENCOUNTER — Encounter: Payer: Self-pay | Admitting: Psychiatry

## 2020-01-11 ENCOUNTER — Ambulatory Visit (INDEPENDENT_AMBULATORY_CARE_PROVIDER_SITE_OTHER): Payer: Medicaid Other | Admitting: Psychiatry

## 2020-01-11 DIAGNOSIS — F5105 Insomnia due to other mental disorder: Secondary | ICD-10-CM

## 2020-01-11 DIAGNOSIS — F3174 Bipolar disorder, in full remission, most recent episode manic: Secondary | ICD-10-CM | POA: Diagnosis not present

## 2020-01-11 DIAGNOSIS — Z634 Disappearance and death of family member: Secondary | ICD-10-CM | POA: Diagnosis not present

## 2020-01-11 MED ORDER — MIRTAZAPINE 7.5 MG PO TABS: 7.5000 mg | ORAL_TABLET | Freq: Every day | ORAL | 1 refills | Status: DC

## 2020-01-11 MED ORDER — ARIPIPRAZOLE 15 MG PO TABS: 15.0000 mg | ORAL_TABLET | Freq: Every day | ORAL | 1 refills | Status: DC

## 2020-01-11 NOTE — Progress Notes (Signed)
Provider Location : [facility] Patient Location : [facility] Visit via Video Note  I connected with Margaret [name] on 01/11/20 at  1:30 PM EST by a video enabled telemedicine application and verified that I am speaking with the correct person using two identifiers.   I discussed the limitations of evaluation and management by telemedicine and the availability of in person appointments. The patient expressed understanding and agreed to proceed. I discussed the assessment and treatment plan with the patient. The patient was provided an opportunity to ask questions and all were answered. The patient agreed with the plan and demonstrated an understanding of the instructions.   The patient was advised to call back or seek an in-person evaluation if the symptoms worsen or if the condition fails to improve as anticipated.   BH MD/PA/NP OP Progress Note  01/11/2020 3:54 PM Margaret [name]  MRN:  [id number]  Chief Complaint:  Chief Complaint    Follow-up     HPI: Margaret [name] is a 64 year old Caucasian female, on disability, lives in [place], divorced, has a history of bipolar disorder, hyperlipidemia, insomnia, diabetes melitis, chronic pain, hypertension was evaluated by telemedicine today.  A video call was attempted however it had to be changed to a phone call due to connection problem.  Patient today reports she is coping with the loss of her mother okay.  Patient denies any suicidality, homicidality or perceptual disturbances.  Patient reports she currently takes Abilify 15 mg since she had a lot of 15 mg left from a previous prescription.  She reports she currently believes the 15 mg is not giving her any side effects like it did back in December when she tried it for the first time.  She reports she has been very motivated and has been able to do a lot more things .  She reports she wants to continue to feel this way and does not want to go back to that phase when she felt depressed and could not do  anything.  She hence wants to stay on this dosage.  She reports sleep is good.  She continues to take Remeron.  Patient denies any side effects to her medications.  Patient reports she was recently diagnosed with liver cirrhosis and had multiple lab work done.  Patient denies any other concerns today. Visit Diagnosis:    ICD-10-CM   1. Bipolar disorder, in full remission, most recent episode manic (HCC)  F31.74 ARIPiprazole (ABILIFY) 15 MG tablet  2. Bereavement  Z63.4   3. Insomnia due to mental condition  F51.05 mirtazapine (REMERON) 7.5 MG tablet    Past Psychiatric History: I have reviewed past psychiatric history from my progress note on 08/21/2019.  Past trials of Depakote, lithium, risperidone, Xanax  Past Medical History:  Past Medical History:  Diagnosis Date  . Bipolar 1 disorder ([place])   . Diabetes mellitus without complication (HCC)    Type 2  . Hypertension     Past Surgical History:  Procedure Laterality Date  . ABDOMINAL HYSTERECTOMY    . CHOLECYSTECTOMY    . COLONOSCOPY WITH PROPOFOL N/A 06/23/2019   Procedure: COLONOSCOPY WITH PROPOFOL;  Surgeon: Jonathon Bellows, MD;  Location: [facility] ENDOSCOPY;  Service: Gastroenterology;  Laterality: N/A;  . ESOPHAGOGASTRODUODENOSCOPY (EGD) WITH PROPOFOL N/A 06/23/2019   Procedure: ESOPHAGOGASTRODUODENOSCOPY (EGD) WITH PROPOFOL;  Surgeon: Jonathon Bellows, MD;  Location: [facility] ENDOSCOPY;  Service: Gastroenterology;  Laterality: N/A;  . TONSILLECTOMY      Family Psychiatric History: I have reviewed family psychiatric history from my  progress note on 08/21/2019  Family History:  Family History  Problem Relation Age of Onset  . Mental illness Father   . Bipolar disorder Brother   . Bipolar disorder Paternal Uncle   . Bipolar disorder Paternal Uncle     Social History: Reviewed social history from my progress note on 08/21/2019 Social History   Socioeconomic History  . Marital status: Divorced    Spouse name: Not on file  .  Number of children: 2  . Years of education: Not on file  . Highest education level: Not on file  Occupational History  . Not on file  Tobacco Use  . Smoking status: Former Smoker    Types: Cigarettes    Quit date: 03/03/2011    Years since quitting: 8.8  . Smokeless tobacco: Never Used  Substance and Sexual Activity  . Alcohol use: Not Currently  . Drug use: Not Currently  . Sexual activity: Not on file  Other Topics Concern  . Not on file  Social History Narrative  . Not on file   Social Determinants of Health   Financial Resource Strain:   . Difficulty of Paying Living Expenses:   Food Insecurity:   . Worried About Charity fundraiser in the Last Year:   . Arboriculturist in the Last Year:   Transportation Needs:   . Film/video editor (Medical):   Marland Kitchen Lack of Transportation (Non-Medical):   Physical Activity:   . Days of Exercise per Week:   . Minutes of Exercise per Session:   Stress:   . Feeling of Stress :   Social Connections:   . Frequency of Communication with Friends and Family:   . Frequency of Social Gatherings with Friends and Family:   . Attends Religious Services:   . Active Member of Clubs or Organizations:   . Attends Archivist Meetings:   Marland Kitchen Marital Status:     Allergies:  Allergies  Allergen Reactions  . Penicillins Hives    Resulted in hospitalization  . Sulfa Antibiotics Hives    Resulted in hospitalization    Metabolic Disorder Labs: Lab Results  Component Value Date   HGBA1C 7.3 (H) 06/22/2019   MPG 162.81 06/22/2019   No results found for: PROLACTIN No results found for: CHOL, TRIG, HDL, CHOLHDL, VLDL, LDLCALC Lab Results  Component Value Date   TSH 1.737 11/16/2019    Therapeutic Level Labs: No results found for: LITHIUM No results found for: VALPROATE No components found for:  CBMZ  Current Medications: Current Outpatient Medications  Medication Sig Dispense Refill  . acetaminophen (TYLENOL) 500 MG tablet  Take 500 mg by mouth every 6 (six) hours as needed.     Marland Kitchen alendronate (FOSAMAX) 35 MG tablet Take 35 mg by mouth every 7 (seven) days. Take with a full glass of water on an empty stomach.    Marland Kitchen amLODipine (NORVASC) 10 MG tablet Take 10 mg by mouth daily.    Marland Kitchen amLODipine (NORVASC) 10 MG tablet Take by mouth.    . B Complex Vitamins (VITAMIN B-COMPLEX) TABS Take 1 tablet by mouth daily.     . B Complex-C (B-COMPLEX WITH VITAMIN C) tablet Take 1 tablet by mouth daily. 30 tablet 2  . cetirizine (ZYRTEC) 10 MG tablet Take 10 mg by mouth daily.    . citalopram (CELEXA) 20 MG tablet Take 1 tablet (20 mg total) by mouth daily. 90 tablet 0  . Dulaglutide (TRULICITY) A999333 0000000 SOPN Inject 0.75  mg into the skin once a week. Thursday    . Ertugliflozin L-PyroglutamicAc (STEGLATRO) 15 MG TABS Take by mouth.    . fluconazole (DIFLUCAN) 100 MG tablet Take 100 mg by mouth daily.    Marland Kitchen gabapentin (NEURONTIN) 600 MG tablet Take 1 tablet (600 mg total) by mouth 3 (three) times daily. 270 tablet 0  . glimepiride (AMARYL) 2 MG tablet Take 2 mg by mouth daily with breakfast.     . hydrochlorothiazide (HYDRODIURIL) 25 MG tablet Take 25 mg by mouth daily.     Marland Kitchen HYDROcodone-acetaminophen (NORCO) 10-325 MG tablet Take 1 tablet by mouth 3 (three) times daily.    . Insulin Glargine (LANTUS SOLOSTAR) 100 UNIT/ML Solostar Pen Inject 60 Units into the skin at bedtime.     . lamoTRIgine (LAMICTAL) 200 MG tablet Take 1 tablet (200 mg total) by mouth daily. 90 tablet 0  . LANTUS SOLOSTAR 100 UNIT/ML Solostar Pen SMARTSIG:60 Unit(s) SUB-Q Every Night    . losartan (COZAAR) 25 MG tablet Take 25 mg by mouth daily. Takes differently    . losartan (COZAAR) 50 MG tablet Take 50 mg by mouth daily.    Marland Kitchen losartan (COZAAR) 50 MG tablet Take by mouth.    . meclizine (ANTIVERT) 25 MG tablet Take 25 mg by mouth 2 (two) times daily as needed.    . meloxicam (MOBIC) 7.5 MG tablet TAKE 1 TABLET BY MOUTH EVERY DAY    . metFORMIN (GLUCOPHAGE)  1000 MG tablet Take 1,000 mg by mouth 2 (two) times daily.    . metFORMIN (GLUCOPHAGE) 500 MG tablet Take 500 mg by mouth 2 (two) times daily with a meal.    . mirtazapine (REMERON) 7.5 MG tablet Take 1 tablet (7.5 mg total) by mouth at bedtime. Mood and sleep 30 tablet 1  . MOVANTIK 25 MG TABS tablet Take 25 mg by mouth daily.    Marland Kitchen NOVOFINE 32G X 6 MM MISC USE TWICE DAILY ASADIRECTEDO    . nystatin-triamcinolone (MYCOLOG II) cream Apply topically 2 (two) times daily. 30 g 0  . olmesartan (BENICAR) 20 MG tablet Take 20 mg by mouth daily.     . pantoprazole (PROTONIX) 40 MG tablet Take 40 mg by mouth daily.    . propranolol (INDERAL) 10 MG tablet TAKE 1 TABLET BY MOUTH 3 TIMES DAILY AS NEEDED. FOR SEVERE ANXIETY ATTACKS ONLY 90 tablet 0  . VICTOZA 18 MG/3ML SOPN Inject 1.2 mg into the skin daily.    . ARIPiprazole (ABILIFY) 15 MG tablet Take 1 tablet (15 mg total) by mouth daily. 30 tablet 1  . ferrous sulfate 325 (65 FE) MG tablet Take 1 tablet (325 mg total) by mouth 2 (two) times daily with a meal. 120 tablet 0  . lisinopril (ZESTRIL) 20 MG tablet Take 20 mg by mouth daily.     No current facility-administered medications for this visit.     Musculoskeletal: Strength & Muscle Tone: UTA Gait & Station: Reports as WNL Patient leans: N/A  Psychiatric Specialty Exam: Review of Systems  Psychiatric/Behavioral: Positive for sleep disturbance. Negative for agitation, behavioral problems, confusion, decreased concentration, dysphoric mood, hallucinations, self-injury and suicidal ideas. The patient is not nervous/anxious and is not hyperactive.   All other systems reviewed and are negative.   There were no vitals taken for this visit.There is no height or weight on file to calculate BMI.  General Appearance: UTA  Eye Contact:  UTA  Speech:  Clear and Coherent  Volume:  Normal  Mood:  Euthymic  Affect:  UTA  Thought Process:  Goal Directed and Descriptions of Associations: Intact   Orientation:  Full (Time, Place, and Person)  Thought Content: Logical   Suicidal Thoughts:  No  Homicidal Thoughts:  No  Memory:  Immediate;   Fair Recent;   Fair Remote;   Fair  Judgement:  Fair  Insight:  Fair  Psychomotor Activity:  UTA  Concentration:  Concentration: Fair and Attention Span: Fair  Recall:  [employer] of Knowledge: Fair  Language: Fair  Akathisia:  No  Handed:  Right  AIMS (if indicated): UTA  Assets:  Communication Skills Desire for Improvement Housing Social Support Talents/Skills  ADL's:  Intact  Cognition: WNL  Sleep:  Restless since she has to urinate a lot at night   Screenings:   Assessment and Plan: Margaret [name] is a 64 year old Caucasian female on disability, divorced, lives in [place], has a history of bipolar disorder, chronic pain, diabetes melitis, hypertension, hyperlipidemia was evaluated by telemedicine today.  She is biologically predisposed given her family history, history of trauma.  She also has psychosocial stressors of her mother's death recently and current pandemic.  Patient currently reports she feels more stable on the current medication regimen.  Plan as noted below.  Plan Bipolar disorder type I manic in remission Patient reports she went back to taking Abilify 15 mg p.o. daily and is currently tolerating it well.  We will continue the same dosage. Lamictal 200 mg p.o. daily Gabapentin 600 mg p.o. 3 times daily Celexa as prescribed.  Insomnia likely due to mental health problems-stable Remeron 7.5 mg p.o. nightly for sleep  Bereavement-improving We will continue to monitor closely  I have reviewed the following labs in E [facility]-TSH-dated 06/11/2019-within normal limits, CMP-AST's-ALT and alkaline phosphatase elevated-11/13/2019-she will continue to follow-up with her providers who recently diagnosed her with cirrhosis of liver.  Hemoglobin A1c-dated 11/13/2019-elevated.  She will follow-up with her providers for the same.  Have  reviewed EKG in E HR dated 12/01/2019-QTC within normal limits.  Follow-up in clinic in 4 to 6 weeks or sooner if needed.  I have spent atleast 20 minutes non face to face with patient today. More than 50 % of the time was spent for preparing to see the patient ( e.g., review of test, records ), ordering medications and test ,psychoeducation and supportive psychotherapy and care coordination,as well as documenting clinical information in electronic health record,interpreting results of test and communication of results This note was generated in part or whole with voice recognition software. Voice recognition is usually quite accurate but there are transcription errors that can and very often do occur. I apologize for any typographical errors that were not detected and corrected.       Ursula Alert, MD 01/11/2020, 3:54 PM

## 2020-01-12 ENCOUNTER — Telehealth: Payer: Self-pay | Admitting: Psychiatry

## 2020-01-12 NOTE — Telephone Encounter (Signed)
I have reviewed results from her EKG done on 01 Dec 2019-QTC-within normal limits, normal sinus rhythm

## 2020-01-17 ENCOUNTER — Encounter: Payer: Self-pay | Admitting: Gastroenterology

## 2020-01-18 ENCOUNTER — Encounter: Payer: Self-pay | Admitting: Gastroenterology

## 2020-01-18 ENCOUNTER — Ambulatory Visit: Payer: Medicaid Other | Admitting: Gastroenterology

## 2020-01-18 ENCOUNTER — Other Ambulatory Visit: Payer: Self-pay

## 2020-01-18 VITALS — BP 109/55 | HR 65 | Temp 98.0°F

## 2020-01-18 DIAGNOSIS — K7460 Unspecified cirrhosis of liver: Secondary | ICD-10-CM

## 2020-01-18 DIAGNOSIS — Z23 Encounter for immunization: Secondary | ICD-10-CM | POA: Diagnosis not present

## 2020-01-18 DIAGNOSIS — D509 Iron deficiency anemia, unspecified: Secondary | ICD-10-CM | POA: Diagnosis not present

## 2020-01-18 DIAGNOSIS — K769 Liver disease, unspecified: Secondary | ICD-10-CM | POA: Diagnosis not present

## 2020-01-18 DIAGNOSIS — R188 Other ascites: Secondary | ICD-10-CM | POA: Diagnosis not present

## 2020-01-18 NOTE — Addendum Note (Signed)
Addended by: Dorethea Clan on: 01/18/2020 02:33 PM   Modules accepted: Orders

## 2020-01-18 NOTE — Progress Notes (Signed)
Jonathon Bellows MD, MRCP(U.K) [address]  [place], [place] [address]  Main: [phone]  Fax: [phone]   Primary Care Physician: Rusty Aus, MD  Primary Gastroenterologist:  Dr. Jonathon Bellows   Chief Complaint  Patient presents with  . Follow-up    f/u Cirrhosis    HPI: Margaret [name] is a 64 y.o. female    Summary of history :  She was last seen in my office on 12/06/2019 for liver cirrhosis incidentally found on imaging in 06/22/2019.  She was admitted into the hospital and 06/22/2019 for anemia.I was consulted to see her for the same. On admission was found to have a hemoglobin of 5.7 with an MCV of 69.4. Ferritin of 4, iron of 13. Normal B12, folate, celiac serology. She has never had a colonoscopy. Mother had colon polyps. Has been taking 1-2 naproxen's for hip pain few times a day for the past 18 months which I felt was the cause of her anemia. I performed an EGD and colonoscopy on 06/23/2019. EGD showed no abnormalities and the colonoscopy showed 3 sessile diminutive polyps which were resected completely. Biopsies  of the duodenum were normal. 3 colon polyps resected were serrated polyp x2 and a tubular adenoma x1.   06/21/2019: Scan of the abdomen showed features of cirrhosis, splenomegaly and small volume ascites. Small broad-based fat-containing midline ventral hernia. 11/16/2019: TSH normal. 11/13/2019: Hemoglobin 13 g MCV 90.8, iron studies normal 11/16/2019 seen Dr. Tasia Catchings for iron deficiency anemia Anemia attributed to NSAID use and since hemoglobin is stable did not proceed with a capsule study of the small bowel.Denies any family history of liver disease.   Interval history   12/06/2019-01/18/2020  12/06/2019: CMP shows an alkaline phosphatase of 122, AST 48, ALT 47, albumin 3.7, creatinine of 0.59, total bilirubin of 0.7.  Not immune to hepatitis A and B needs immunization ferritin 45.  Celiac serology normal.  Otherwise autoimmune work-up is  negative INR 1.1.  12/20/2019: Ultrasound abdomen shows a 1 cm hypoechoic nodule within the liver lobe CT recommended 01/06/2020: MRI of the liver demonstrates hepatic cirrhosis moderate splenomegaly consistent with portal venous hypertension.  Mild lymphadenopathy.  No evidence of any liver mass.  Since her last visit she has been doing well.    No constipation no confusion.  No other issues.  Not on any NSAIDs.     Current Outpatient Medications  Medication Sig Dispense Refill  . acetaminophen (TYLENOL) 500 MG tablet Take 500 mg by mouth every 6 (six) hours as needed.     Marland Kitchen alendronate (FOSAMAX) 35 MG tablet Take 35 mg by mouth every 7 (seven) days. Take with a full glass of water on an empty stomach.    Marland Kitchen amLODipine (NORVASC) 10 MG tablet Take 10 mg by mouth daily.    Marland Kitchen amLODipine (NORVASC) 10 MG tablet Take by mouth.    . ARIPiprazole (ABILIFY) 15 MG tablet Take 1 tablet (15 mg total) by mouth daily. 30 tablet 1  . B Complex Vitamins (VITAMIN B-COMPLEX) TABS Take 1 tablet by mouth daily.     . B Complex-C (B-COMPLEX WITH VITAMIN C) tablet Take 1 tablet by mouth daily. 30 tablet 2  . cetirizine (ZYRTEC) 10 MG tablet Take 10 mg by mouth daily.    . citalopram (CELEXA) 20 MG tablet Take 1 tablet (20 mg total) by mouth daily. 90 tablet 0  . Dulaglutide (TRULICITY) A999333 0000000 SOPN Inject 0.75 mg into the skin once a week. Thursday    .  Ertugliflozin L-PyroglutamicAc (STEGLATRO) 15 MG TABS Take by mouth.    . fluconazole (DIFLUCAN) 100 MG tablet Take 100 mg by mouth daily.    Marland Kitchen gabapentin (NEURONTIN) 600 MG tablet Take 1 tablet (600 mg total) by mouth 3 (three) times daily. 270 tablet 0  . glimepiride (AMARYL) 2 MG tablet Take 2 mg by mouth daily with breakfast.     . HYDROcodone-acetaminophen (NORCO) 10-325 MG tablet Take 1 tablet by mouth 3 (three) times daily.    . Insulin Glargine (LANTUS SOLOSTAR) 100 UNIT/ML Solostar Pen Inject 60 Units into the skin at bedtime.     . lamoTRIgine  (LAMICTAL) 200 MG tablet Take 1 tablet (200 mg total) by mouth daily. 90 tablet 0  . LANTUS SOLOSTAR 100 UNIT/ML Solostar Pen SMARTSIG:60 Unit(s) SUB-Q Every Night    . losartan (COZAAR) 25 MG tablet Take 25 mg by mouth daily. Takes differently    . meloxicam (MOBIC) 7.5 MG tablet TAKE 1 TABLET BY MOUTH EVERY DAY    . metFORMIN (GLUCOPHAGE) 1000 MG tablet Take 1,000 mg by mouth 2 (two) times daily.    . metFORMIN (GLUCOPHAGE) 500 MG tablet Take 500 mg by mouth 2 (two) times daily with a meal.    . MOVANTIK 25 MG TABS tablet Take 25 mg by mouth daily.    Marland Kitchen NOVOFINE 32G X 6 MM MISC USE TWICE DAILY ASADIRECTEDO    . olmesartan (BENICAR) 20 MG tablet Take 20 mg by mouth daily.     . pantoprazole (PROTONIX) 40 MG tablet Take 40 mg by mouth daily.    . propranolol (INDERAL) 10 MG tablet TAKE 1 TABLET BY MOUTH 3 TIMES DAILY AS NEEDED. FOR SEVERE ANXIETY ATTACKS ONLY 90 tablet 0  . ferrous sulfate 325 (65 FE) MG tablet Take 1 tablet (325 mg total) by mouth 2 (two) times daily with a meal. 120 tablet 0  . hydrochlorothiazide (HYDRODIURIL) 25 MG tablet Take 25 mg by mouth daily.     Marland Kitchen lisinopril (ZESTRIL) 20 MG tablet Take 20 mg by mouth daily.    Marland Kitchen losartan (COZAAR) 50 MG tablet Take 50 mg by mouth daily.    Marland Kitchen losartan (COZAAR) 50 MG tablet Take by mouth.    . meclizine (ANTIVERT) 25 MG tablet Take 25 mg by mouth 2 (two) times daily as needed.    . mirtazapine (REMERON) 7.5 MG tablet Take 1 tablet (7.5 mg total) by mouth at bedtime. Mood and sleep 30 tablet 1  . nystatin-triamcinolone (MYCOLOG II) cream Apply topically 2 (two) times daily. (Patient not taking: Reported on 01/18/2020) 30 g 0  . VICTOZA 18 MG/3ML SOPN Inject 1.2 mg into the skin daily.     No current facility-administered medications for this visit.    Allergies as of 01/18/2020 - Review Complete 01/18/2020  Allergen Reaction Noted  . Penicillins Hives 12/03/2018  . Sulfa antibiotics Hives 12/03/2018    ROS:  General: Negative  for anorexia, weight loss, fever, chills, fatigue, weakness. ENT: Negative for hoarseness, difficulty swallowing , nasal congestion. CV: Negative for chest pain, angina, palpitations, dyspnea on exertion, peripheral edema.  Respiratory: Negative for dyspnea at rest, dyspnea on exertion, cough, sputum, wheezing.  GI: See history of present illness. GU:  Negative for dysuria, hematuria, urinary incontinence, urinary frequency, nocturnal urination.  Endo: Negative for unusual weight change.    Physical Examination:   BP (!) 109/55 (BP Location: Right Arm, Patient Position: Sitting, Cuff Size: Large)   Pulse 65   Temp 98  F (36.7 C) (Oral)   General: Well-nourished, well-developed in no acute distress.  Eyes: No icterus. Conjunctivae pink. Extremities: No lower extremity edema. No clubbing or deformities. Neuro: Alert and oriented x 3.  Grossly intact. Psych: Alert and cooperative, normal mood and affect.   Imaging Studies: US Abdomen Complete  Result Date: 12/20/2019 CLINICAL DATA:  History of cirrhosis EXAM: ABDOMEN ULTRASOUND COMPLETE COMPARISON:  June 21, 2019. FINDINGS: Gallbladder: The patient is status post cholecystectomy Common bile duct: Diameter: 1.8 mm Liver: There is a heterogeneous echotexture with nodular contour to the liver parenchyma. There is question of a 1 cm hypoechoic nodule within the left liver lobe. No internal vascularity seen within this lesion. Portal vein is patent on color Doppler imaging with normal direction of blood flow towards the liver. IVC: No abnormality visualized. Pancreas: Visualized portion unremarkable. Spleen: The spleen is enlarged measuring 16.7 cm. No focal splenic lesion. Right Kidney: Length: 10.6 cm. Echogenicity within normal limits. No mass or hydronephrosis visualized. Left Kidney: Length: 11.3 cm. Echogenicity within normal limits. There is a 9 mm anechoic cyst seen within the left kidney. Abdominal aorta: No aneurysm visualized. Calcified  atherosclerotic plaque is seen. Other findings: None. IMPRESSION: Nonspecific 1 cm hypoechoic nodule within the left liver lobe. For further evaluation would recommend CT liver mass protocol or MRI with contrast. Findings consistent with cirrhosis and splenomegaly Electronically Signed   By: Prudencio Pair M.D.   On: 12/20/2019 16:00   MR LIVER W WO CONTRAST  Result Date: 01/06/2020 CLINICAL DATA:  Cirrhosis. Indeterminate liver lesion on recent ultrasound. EXAM: MRI ABDOMEN WITHOUT AND WITH CONTRAST TECHNIQUE: Multiplanar multisequence MR imaging of the abdomen was performed both before and after the administration of intravenous contrast. CONTRAST:  61mL GADAVIST GADOBUTROL 1 MMOL/ML IV SOLN COMPARISON:  Ultrasound on 12/20/2019 and CT on 06/21/2019 FINDINGS: Lower chest: No acute findings. Hepatobiliary: Image degradation by motion artifact noted. Hepatic cirrhosis again demonstrated. No liver masses identified. Prior cholecystectomy noted. No evidence of biliary obstruction. Pancreas:  No mass or inflammatory changes. Spleen: Moderate splenomegaly again seen measuring approximately 16 cm in length. No splenic masses identified. Adrenals/Urinary Tract: No masses identified. A few tiny sub-cm renal cysts are noted bilaterally. No evidence of hydronephrosis. Stomach/Bowel: Visualized portion unremarkable. Vascular/Lymphatic: Mild lymphadenopathy is seen in the gastrohepatic ligament, porta hepatis, portacaval space, likely reactive in etiology. Other:  None. Musculoskeletal:  No suspicious bone lesions identified. IMPRESSION: 1. Image degradation by motion artifact noted. Hepatic cirrhosis. No evidence of hepatic neoplasm. 2. Moderate splenomegaly, consistent with portal venous hypertension. 3. Mild lymphadenopathy in gastrohepatic ligament, porta hepatis, and portacaval space, likely reactive in etiology. Electronically Signed   By: Marlaine Hind M.D.   On: 01/06/2020 17:16    Assessment and Plan:   Shaguana Chai is a 64 y.o. y/o female here to follow-up for iron deficiency anemia which was felt to be due to long-term NSAID use.  EGD plus colonoscopy were noncontributory.  Incidentally on the CT scan of the abdomen was found to have features of cirrhosis and portal hypertension compensated.  No esophageal varices noted and no hepatic encephalopathy.  Very likely etiology of cirrhosis secondary to nonalcoholic fatty liver disease   Plan 1.   Since anemia is resolved we can skip capsule study of the small bowel at this point of time.  It is very likely that her anemia was due to NSAID usage.  If there is a further drop in hemoglobin of iron then we will evaluate with  capsule study of the small bowel. 2.  Counseled on NAFLD which would include tight control of cardiovascular risk factors, exercise, weight loss, healthy eating. 3.Right upper quadrant ultrasound in 07/22/2020 to screen for [place] 4.  Hepatitis a and B vaccination 5.  Avoid NSAIDs 6. EGD to screen for esophageal varices in 2023 7. Low-sodium diet   Dr Jonathon Bellows  MD,MRCP [facility]) Follow up in 6 months

## 2020-02-07 ENCOUNTER — Encounter: Payer: Self-pay | Admitting: Psychiatry

## 2020-02-09 ENCOUNTER — Telehealth: Payer: Self-pay

## 2020-02-09 DIAGNOSIS — F3110 Bipolar disorder, current episode manic without psychotic features, unspecified: Secondary | ICD-10-CM

## 2020-02-09 MED ORDER — GABAPENTIN 600 MG PO TABS: 600.0000 mg | ORAL_TABLET | Freq: Three times a day (TID) | ORAL | 0 refills | Status: DC

## 2020-02-09 NOTE — Telephone Encounter (Signed)
Patient called requesting a refill on her Gabapentin 600mg  to be sent to [facility] on [address] in [place]. She has a scheduled appointment on 02/22/20. Thank you.

## 2020-02-09 NOTE — Telephone Encounter (Signed)
I have sent gabapentin to her pharmacy.

## 2020-02-22 ENCOUNTER — Encounter: Payer: Self-pay | Admitting: Psychiatry

## 2020-02-22 ENCOUNTER — Other Ambulatory Visit: Payer: Self-pay

## 2020-02-22 ENCOUNTER — Telehealth (INDEPENDENT_AMBULATORY_CARE_PROVIDER_SITE_OTHER): Payer: Medicaid Other | Admitting: Psychiatry

## 2020-02-22 DIAGNOSIS — F3174 Bipolar disorder, in full remission, most recent episode manic: Secondary | ICD-10-CM

## 2020-02-22 DIAGNOSIS — Z634 Disappearance and death of family member: Secondary | ICD-10-CM

## 2020-02-22 DIAGNOSIS — F5105 Insomnia due to other mental disorder: Secondary | ICD-10-CM

## 2020-02-22 DIAGNOSIS — F419 Anxiety disorder, unspecified: Secondary | ICD-10-CM | POA: Diagnosis not present

## 2020-02-22 MED ORDER — LAMOTRIGINE 200 MG PO TABS: 200.0000 mg | ORAL_TABLET | Freq: Every day | ORAL | 0 refills | Status: DC

## 2020-02-22 MED ORDER — MIRTAZAPINE 7.5 MG PO TABS: 7.5000 mg | ORAL_TABLET | Freq: Every day | ORAL | 1 refills | Status: DC

## 2020-02-22 MED ORDER — PROPRANOLOL HCL 10 MG PO TABS: ORAL_TABLET | ORAL | 2 refills | Status: DC

## 2020-02-22 MED ORDER — CITALOPRAM HYDROBROMIDE 20 MG PO TABS: 10.0000 mg | ORAL_TABLET | Freq: Every day | ORAL | 0 refills | Status: DC

## 2020-02-22 NOTE — Patient Instructions (Signed)
Follow-up in clinic on May 25 at 11 AM

## 2020-02-22 NOTE — Progress Notes (Signed)
Provider Location : [facility] Patient Location : [facility]  Virtual Visit via Video Note  I connected with Margaret [name] on 02/22/20 at 11:00 AM EDT by a video enabled telemedicine application and verified that I am speaking with the correct person using two identifiers.   I discussed the limitations of evaluation and management by telemedicine and the availability of in person appointments. The patient expressed understanding and agreed to proceed.    I discussed the assessment and treatment plan with the patient. The patient was provided an opportunity to ask questions and all were answered. The patient agreed with the plan and demonstrated an understanding of the instructions.   The patient was advised to call back or seek an in-person evaluation if the symptoms worsen or if the condition fails to improve as anticipated.                                                                                                                                       [facility] MD OP Progress Note  02/22/2020 12:04 PM Margaret [name]  MRN:  [id number]  Chief Complaint:  Chief Complaint    Follow-up     HPI: Margaret [name] is a 64 year old Caucasian female on disability, lives in [place], divorced, has a history of bipolar disorder, hyperlipidemia, insomnia, diabetes melitis, chronic pain, hypertension was evaluated by telemedicine today.  Patient today reports she is currently making progress with her grief.  She is coping better with the loss of her mother.  She has good social support system.  She is currently spending time with her mother's brother and his family.  She reports she is compliant on all her medications as prescribed.  She reports she is sleeping better on the mirtazapine.  She denies side effects.  She continues to be compliant on all her medications as prescribed and denies side effects.  She does report vertigo on and off.  She however had this before and used to take meclizine.  She reports she  would like to reach out to her provider to restart her on meclizine.  Patient denies any suicidality, homicidality or perceptual disturbances.  Patient denies any other concerns today. Visit Diagnosis:    ICD-10-CM   1. Bipolar disorder, in full remission, most recent episode manic (HCC)  F31.74 citalopram (CELEXA) 20 MG tablet  2. Bereavement  Z63.4 lamoTRIgine (LAMICTAL) 200 MG tablet  3. Insomnia due to mental condition  F51.05 mirtazapine (REMERON) 7.5 MG tablet  4. Anxiety disorder, unspecified type  F41.9 propranolol (INDERAL) 10 MG tablet    Past Psychiatric History: I have reviewed past psychiatric history from my progress note on 08/21/2019.  Past trials of Depakote, lithium, risperidone, Xanax  Past Medical History:  Past Medical History:  Diagnosis Date  . Bipolar 1 disorder ([place])   . Diabetes mellitus without complication (HCC)    Type 2  . Hypertension     Past Surgical  History:  Procedure Laterality Date  . ABDOMINAL HYSTERECTOMY    . CHOLECYSTECTOMY    . COLONOSCOPY WITH PROPOFOL N/A 06/23/2019   Procedure: COLONOSCOPY WITH PROPOFOL;  Surgeon: Jonathon Bellows, MD;  Location: [facility] ENDOSCOPY;  Service: Gastroenterology;  Laterality: N/A;  . ESOPHAGOGASTRODUODENOSCOPY (EGD) WITH PROPOFOL N/A 06/23/2019   Procedure: ESOPHAGOGASTRODUODENOSCOPY (EGD) WITH PROPOFOL;  Surgeon: Jonathon Bellows, MD;  Location: [facility] ENDOSCOPY;  Service: Gastroenterology;  Laterality: N/A;  . TONSILLECTOMY      Family Psychiatric History: I have reviewed family psychiatric history from my progress note on 08/21/2019  Family History:  Family History  Problem Relation Age of Onset  . Mental illness Father   . Bipolar disorder Brother   . Bipolar disorder Paternal Uncle   . Bipolar disorder Paternal Uncle     Social History: I have reviewed social history from my progress note on 08/21/2019 Social History   Socioeconomic History  . Marital status: Divorced    Spouse name: Not on file  .  Number of children: 2  . Years of education: Not on file  . Highest education level: Not on file  Occupational History  . Not on file  Tobacco Use  . Smoking status: Former Smoker    Types: Cigarettes    Quit date: 03/03/2011    Years since quitting: 8.9  . Smokeless tobacco: Never Used  Substance and Sexual Activity  . Alcohol use: Not Currently  . Drug use: Not Currently  . Sexual activity: Not on file  Other Topics Concern  . Not on file  Social History Narrative  . Not on file   Social Determinants of Health   Financial Resource Strain:   . Difficulty of Paying Living Expenses:   Food Insecurity:   . Worried About Charity fundraiser in the Last Year:   . Arboriculturist in the Last Year:   Transportation Needs:   . Film/video editor (Medical):   Marland Kitchen Lack of Transportation (Non-Medical):   Physical Activity:   . Days of Exercise per Week:   . Minutes of Exercise per Session:   Stress:   . Feeling of Stress :   Social Connections:   . Frequency of Communication with Friends and Family:   . Frequency of Social Gatherings with Friends and Family:   . Attends Religious Services:   . Active Member of Clubs or Organizations:   . Attends Archivist Meetings:   Marland Kitchen Marital Status:     Allergies:  Allergies  Allergen Reactions  . Penicillins Hives    Resulted in hospitalization  . Sulfa Antibiotics Hives    Resulted in hospitalization    Metabolic Disorder Labs: Lab Results  Component Value Date   HGBA1C 7.3 (H) 06/22/2019   MPG 162.81 06/22/2019   No results found for: PROLACTIN No results found for: CHOL, TRIG, HDL, CHOLHDL, VLDL, LDLCALC Lab Results  Component Value Date   TSH 1.737 11/16/2019    Therapeutic Level Labs: No results found for: LITHIUM No results found for: VALPROATE No components found for:  CBMZ  Current Medications: Current Outpatient Medications  Medication Sig Dispense Refill  . acetaminophen (TYLENOL) 500 MG tablet  Take 500 mg by mouth every 6 (six) hours as needed.     Marland Kitchen alendronate (FOSAMAX) 35 MG tablet Take 35 mg by mouth every 7 (seven) days. Take with a full glass of water on an empty stomach.    . ARIPiprazole (ABILIFY) 15 MG tablet Take 1 tablet (  15 mg total) by mouth daily. 30 tablet 1  . azithromycin (ZITHROMAX) 250 MG tablet TAKE AS DIRECTED PEROPACKAGE INSTRUCTIONS.    . B Complex Vitamins (VITAMIN B-COMPLEX) TABS Take 1 tablet by mouth daily.     . B Complex-C (B-COMPLEX WITH VITAMIN C) tablet Take 1 tablet by mouth daily. 30 tablet 2  . cetirizine (ZYRTEC) 10 MG tablet Take 10 mg by mouth daily.    . citalopram (CELEXA) 20 MG tablet Take 0.5 tablets (10 mg total) by mouth daily. Stop taking after 10 days 10 tablet 0  . clindamycin (CLEOCIN) 150 MG capsule TAKE 4 CAPSULES 1 HOURABEFORE DENTAL APPOINTMENT.    . Dulaglutide (TRULICITY) A999333 0000000 SOPN Inject 0.75 mg into the skin once a week. Thursday    . Ertugliflozin L-PyroglutamicAc (STEGLATRO) 15 MG TABS Take by mouth.    . fluconazole (DIFLUCAN) 100 MG tablet Take 100 mg by mouth daily.    . furosemide (LASIX) 20 MG tablet Take 10 mg by mouth daily as needed.    . gabapentin (NEURONTIN) 600 MG tablet Take 1 tablet (600 mg total) by mouth 3 (three) times daily. 270 tablet 0  . glimepiride (AMARYL) 2 MG tablet Take 2 mg by mouth daily with breakfast.     . hydrochlorothiazide (HYDRODIURIL) 25 MG tablet Take 25 mg by mouth daily.     Marland Kitchen HYDROcodone-acetaminophen (NORCO) 10-325 MG tablet Take 1 tablet by mouth 3 (three) times daily.    . Insulin Glargine (LANTUS SOLOSTAR) 100 UNIT/ML Solostar Pen Inject 60 Units into the skin at bedtime.     . lamoTRIgine (LAMICTAL) 200 MG tablet Take 1 tablet (200 mg total) by mouth daily. 90 tablet 0  . LANTUS SOLOSTAR 100 UNIT/ML Solostar Pen SMARTSIG:60 Unit(s) SUB-Q Every Night    . losartan (COZAAR) 25 MG tablet Take 25 mg by mouth daily. Takes differently    . meloxicam (MOBIC) 7.5 MG tablet TAKE 1  TABLET BY MOUTH EVERY DAY    . metFORMIN (GLUCOPHAGE) 500 MG tablet Take 500 mg by mouth 2 (two) times daily with a meal.    . mirtazapine (REMERON) 7.5 MG tablet Take 1 tablet (7.5 mg total) by mouth at bedtime. Mood and sleep 30 tablet 1  . NOVOFINE 32G X 6 MM MISC USE TWICE DAILY ASADIRECTEDO    . pantoprazole (PROTONIX) 40 MG tablet Take 40 mg by mouth daily.    . propranolol (INDERAL) 10 MG tablet TAKE 1 TABLET BY MOUTH 3 TIMES DAILY AS NEEDED. FOR SEVERE ANXIETY ATTACKS ONLY 90 tablet 2  . ferrous sulfate 325 (65 FE) MG tablet Take 1 tablet (325 mg total) by mouth 2 (two) times daily with a meal. 120 tablet 0  . losartan (COZAAR) 50 MG tablet Take 50 mg by mouth daily.    Marland Kitchen losartan (COZAAR) 50 MG tablet Take by mouth.    . meclizine (ANTIVERT) 25 MG tablet Take 25 mg by mouth 2 (two) times daily as needed.    . metFORMIN (GLUCOPHAGE) 1000 MG tablet Take 1,000 mg by mouth 2 (two) times daily.     No current facility-administered medications for this visit.     Musculoskeletal: Strength & Muscle Tone: UTA Gait & Station: normal Patient leans: N/A  Psychiatric Specialty Exam: Review of Systems  Neurological:       Reports vertigo on and off  Psychiatric/Behavioral: Negative for agitation, behavioral problems, confusion, decreased concentration, dysphoric mood, hallucinations, self-injury, sleep disturbance and suicidal ideas. The patient is not nervous/anxious  and is not hyperactive.   All other systems reviewed and are negative.   There were no vitals taken for this visit.There is no height or weight on file to calculate BMI.  General Appearance: Casual  Eye Contact:  Fair  Speech:  Clear and Coherent  Volume:  Normal  Mood:  Euthymic  Affect:  Congruent  Thought Process:  Goal Directed and Descriptions of Associations: Intact  Orientation:  Full (Time, Place, and Person)  Thought Content: Logical   Suicidal Thoughts:  No  Homicidal Thoughts:  No  Memory:  Immediate;    Fair Recent;   Fair Remote;   Fair  Judgement:  Fair  Insight:  Fair  Psychomotor Activity:  Normal  Concentration:  Concentration: Fair and Attention Span: Fair  Recall:  [employer] of Knowledge: Fair  Language: Fair  Akathisia:  No  Handed:  Right  AIMS (if indicated): UTA  Assets:  Communication Skills Desire for Improvement Housing Social Support  ADL's:  Intact  Cognition: WNL  Sleep:  Fair   Screenings:   Assessment and Plan: Lashundria is a 64 year old Caucasian female on disability, divorced, lives in [place], has a history of bipolar disorder, chronic pain, diabetes melitis, hypertension, hyperlipidemia was evaluated by telemedicine today.  Patient is biologically predisposed given her family history, history of trauma.  Patient with psychosocial stressors of her mother's death recently and the current pandemic.  She however is currently making progress.  Plan as noted below.  Plan Bipolar disorder type I manic in remission Continue Lamictal 200 mg p.o. daily Continue Abilify 15 mg p.o. daily Gabapentin 600 mg p.o. 3 times daily Reduce Celexa to 10 mg p.o. daily for the next 10 days and stop taking it.  Celexa is being tapered off now that she is on mirtazapine.  Insomnia likely due to mental health problems-stable Remeron 7.5 mg p.o. nightly for sleep  Bereavement-improving Will monitor closely  Discussed with patient to reach out to her primary care provider for her vertigo.  Follow-up in clinic in 1 month or sooner if needed.  I have spent atleast 20 minutes non face to face with patient today. More than 50 % of the time was spent for preparing to see the patient ( e.g., review of test, records ), ordering medications and test ,psychoeducation and supportive psychotherapy and care coordination,as well as documenting clinical information in electronic health record. This note was generated in part or whole with voice recognition software. Voice recognition is  usually quite accurate but there are transcription errors that can and very often do occur. I apologize for any typographical errors that were not detected and corrected.      Ursula Alert, MD 02/22/2020, 12:04 PM

## 2020-02-23 ENCOUNTER — Ambulatory Visit: Payer: Medicaid Other

## 2020-03-08 ENCOUNTER — Telehealth: Payer: Self-pay | Admitting: *Deleted

## 2020-03-08 NOTE — Telephone Encounter (Signed)
Per pt request to cx her 03/13/20 lab and 03/15/20 MD appts due to being out of town and stated that she would call back to R/S at a later date.

## 2020-03-13 ENCOUNTER — Inpatient Hospital Stay: Payer: Medicaid Other

## 2020-03-15 ENCOUNTER — Inpatient Hospital Stay: Payer: Medicaid Other | Admitting: Oncology

## 2020-03-26 ENCOUNTER — Other Ambulatory Visit: Payer: Self-pay

## 2020-03-26 ENCOUNTER — Encounter: Payer: Self-pay | Admitting: Psychiatry

## 2020-03-26 ENCOUNTER — Telehealth (INDEPENDENT_AMBULATORY_CARE_PROVIDER_SITE_OTHER): Payer: Medicaid Other | Admitting: Psychiatry

## 2020-03-26 DIAGNOSIS — F5105 Insomnia due to other mental disorder: Secondary | ICD-10-CM | POA: Diagnosis not present

## 2020-03-26 DIAGNOSIS — Z634 Disappearance and death of family member: Secondary | ICD-10-CM | POA: Diagnosis not present

## 2020-03-26 DIAGNOSIS — F3174 Bipolar disorder, in full remission, most recent episode manic: Secondary | ICD-10-CM

## 2020-03-26 MED ORDER — ARIPIPRAZOLE 15 MG PO TABS: 15.0000 mg | ORAL_TABLET | Freq: Every day | ORAL | 0 refills | Status: DC

## 2020-03-26 NOTE — Progress Notes (Signed)
Provider Location : [facility] Patient Location : Home  Virtual Visit via Video Note  I connected with Margaret [name] on 03/26/20 at 11:00 AM EDT by a video enabled telemedicine application and verified that I am speaking with the correct person using two identifiers.   I discussed the limitations of evaluation and management by telemedicine and the availability of in person appointments. The patient expressed understanding and agreed to proceed.   I discussed the assessment and treatment plan with the patient. The patient was provided an opportunity to ask questions and all were answered. The patient agreed with the plan and demonstrated an understanding of the instructions.   The patient was advised to call back or seek an in-person evaluation if the symptoms worsen or if the condition fails to improve as anticipated.  [facility] MD OP Progress Note  03/26/2020 3:46 PM Margaret [name]  MRN:  [id number]  Chief Complaint:  Chief Complaint    Follow-up     HPI: Margaret [name] is a 64 year old Caucasian female on disability, lives in [place], divorced, has a history of bipolar disorder, hyperlipidemia, insomnia, diabetes melitis, chronic pain, hypertension was evaluated by telemedicine today.  Patient today reports she is currently doing well with regards to her mood.  She denies any mood swings.  She denies any psychosis.  She denies any suicidality or homicidality.  She reports sleep is good.  She reports she is not taking the mirtazapine currently since she is sleeping okay.  She reports appetite as fair.  She reports she is currently taking care of her aunt who needs help.  Her aunt was recently discharged from the hospital.  Patient reports she is compliant on her other medications and denies side effects.  Patient denies any other concerns today.  Visit Diagnosis:    ICD-10-CM   1. Bipolar disorder, in full remission, most recent episode manic (HCC)  F31.74 ARIPiprazole (ABILIFY) 15 MG  tablet  2. Bereavement  Z63.4   3. Insomnia due to mental condition  F51.05     Past Psychiatric History: I have reviewed past psychiatric history from my progress note on 08/21/2019.  Past trials of Depakote, lithium, risperidone, Xanax  Past Medical History:  Past Medical History:  Diagnosis Date  . Bipolar 1 disorder ([place])   . Diabetes mellitus without complication (HCC)    Type 2  . Hypertension     Past Surgical History:  Procedure Laterality Date  . ABDOMINAL HYSTERECTOMY    . CHOLECYSTECTOMY    . COLONOSCOPY WITH PROPOFOL N/A 06/23/2019   Procedure: COLONOSCOPY WITH PROPOFOL;  Surgeon: Jonathon Bellows, MD;  Location: [facility] ENDOSCOPY;  Service: Gastroenterology;  Laterality: N/A;  . ESOPHAGOGASTRODUODENOSCOPY (EGD) WITH PROPOFOL N/A 06/23/2019   Procedure: ESOPHAGOGASTRODUODENOSCOPY (EGD) WITH PROPOFOL;  Surgeon: Jonathon Bellows, MD;  Location: [facility] ENDOSCOPY;  Service: Gastroenterology;  Laterality: N/A;  . TONSILLECTOMY      Family Psychiatric History: I have reviewed family psychiatric history from my progress note on 08/21/2019  Family History:  Family History  Problem Relation Age of Onset  . Mental illness Father   . Bipolar disorder Brother   . Bipolar disorder Paternal Uncle   . Bipolar disorder Paternal Uncle     Social History: I have reviewed social history from my progress note on 08/21/2019 Social History   Socioeconomic History  . Marital status: Divorced    Spouse name: Not on file  . Number of children: 2  . Years of education: Not on file  . Highest education level:  Not on file  Occupational History  . Not on file  Tobacco Use  . Smoking status: Former Smoker    Types: Cigarettes    Quit date: 03/03/2011    Years since quitting: 9.0  . Smokeless tobacco: Never Used  Substance and Sexual Activity  . Alcohol use: Not Currently  . Drug use: Not Currently  . Sexual activity: Not on file  Other Topics Concern  . Not on file  Social History Narrative   . Not on file   Social Determinants of Health   Financial Resource Strain:   . Difficulty of Paying Living Expenses:   Food Insecurity:   . Worried About Charity fundraiser in the Last Year:   . Arboriculturist in the Last Year:   Transportation Needs:   . Film/video editor (Medical):   Marland Kitchen Lack of Transportation (Non-Medical):   Physical Activity:   . Days of Exercise per Week:   . Minutes of Exercise per Session:   Stress:   . Feeling of Stress :   Social Connections:   . Frequency of Communication with Friends and Family:   . Frequency of Social Gatherings with Friends and Family:   . Attends Religious Services:   . Active Member of Clubs or Organizations:   . Attends Archivist Meetings:   Marland Kitchen Marital Status:     Allergies:  Allergies  Allergen Reactions  . Penicillins Hives    Resulted in hospitalization  . Sulfa Antibiotics Hives    Resulted in hospitalization    Metabolic Disorder Labs: Lab Results  Component Value Date   HGBA1C 7.3 (H) 06/22/2019   MPG 162.81 06/22/2019   No results found for: PROLACTIN No results found for: CHOL, TRIG, HDL, CHOLHDL, VLDL, LDLCALC Lab Results  Component Value Date   TSH 1.737 11/16/2019    Therapeutic Level Labs: No results found for: LITHIUM No results found for: VALPROATE No components found for:  CBMZ  Current Medications: Current Outpatient Medications  Medication Sig Dispense Refill  . acetaminophen (TYLENOL) 500 MG tablet Take 500 mg by mouth every 6 (six) hours as needed.     Marland Kitchen alendronate (FOSAMAX) 35 MG tablet Take 35 mg by mouth every 7 (seven) days. Take with a full glass of water on an empty stomach.    . ARIPiprazole (ABILIFY) 15 MG tablet Take 1 tablet (15 mg total) by mouth daily. 90 tablet 0  . azithromycin (ZITHROMAX) 250 MG tablet TAKE AS DIRECTED PEROPACKAGE INSTRUCTIONS.    . B Complex Vitamins (VITAMIN B-COMPLEX) TABS Take 1 tablet by mouth daily.     . B Complex-C (B-COMPLEX WITH  VITAMIN C) tablet Take 1 tablet by mouth daily. 30 tablet 2  . cetirizine (ZYRTEC) 10 MG tablet Take 10 mg by mouth daily.    . clindamycin (CLEOCIN) 150 MG capsule TAKE 4 CAPSULES 1 HOURABEFORE DENTAL APPOINTMENT.    . Dulaglutide (TRULICITY) A999333 0000000 SOPN Inject 0.75 mg into the skin once a week. Thursday    . Ertugliflozin L-PyroglutamicAc (STEGLATRO) 15 MG TABS Take by mouth.    . fluconazole (DIFLUCAN) 100 MG tablet Take 100 mg by mouth daily.    . furosemide (LASIX) 20 MG tablet Take 10 mg by mouth daily as needed.    . gabapentin (NEURONTIN) 600 MG tablet Take 1 tablet (600 mg total) by mouth 3 (three) times daily. 270 tablet 0  . glimepiride (AMARYL) 2 MG tablet Take 2 mg by mouth daily with  breakfast.     . hydrochlorothiazide (HYDRODIURIL) 25 MG tablet Take 25 mg by mouth daily.     Marland Kitchen HYDROcodone-acetaminophen (NORCO) 10-325 MG tablet Take 1 tablet by mouth 3 (three) times daily.    . Insulin Glargine (LANTUS SOLOSTAR) 100 UNIT/ML Solostar Pen Inject 60 Units into the skin at bedtime.     . lamoTRIgine (LAMICTAL) 200 MG tablet Take 1 tablet (200 mg total) by mouth daily. 90 tablet 0  . LANTUS SOLOSTAR 100 UNIT/ML Solostar Pen SMARTSIG:60 Unit(s) SUB-Q Every Night    . losartan (COZAAR) 25 MG tablet Take 25 mg by mouth daily. Takes differently    . meclizine (ANTIVERT) 25 MG tablet Take 25 mg by mouth 2 (two) times daily as needed.    . meloxicam (MOBIC) 7.5 MG tablet TAKE 1 TABLET BY MOUTH EVERY DAY    . metFORMIN (GLUCOPHAGE) 500 MG tablet Take 500 mg by mouth 2 (two) times daily with a meal.    . NOVOFINE 32G X 6 MM MISC USE TWICE DAILY ASADIRECTEDO    . pantoprazole (PROTONIX) 40 MG tablet Take 40 mg by mouth daily.    . propranolol (INDERAL) 10 MG tablet TAKE 1 TABLET BY MOUTH 3 TIMES DAILY AS NEEDED. FOR SEVERE ANXIETY ATTACKS ONLY 90 tablet 2  . ferrous sulfate 325 (65 FE) MG tablet Take 1 tablet (325 mg total) by mouth 2 (two) times daily with a meal. 120 tablet 0  .  losartan (COZAAR) 50 MG tablet Take 50 mg by mouth daily.    Marland Kitchen losartan (COZAAR) 50 MG tablet Take by mouth.    . mirtazapine (REMERON) 7.5 MG tablet Take 1 tablet (7.5 mg total) by mouth at bedtime. Mood and sleep (Patient not taking: Reported on 03/26/2020) 30 tablet 1   No current facility-administered medications for this visit.     Musculoskeletal: Strength & Muscle Tone: UTA Gait & Station: normal Patient leans: N/A  Psychiatric Specialty Exam: Review of Systems  Psychiatric/Behavioral: Negative for agitation, behavioral problems, confusion, decreased concentration, dysphoric mood, hallucinations, self-injury, sleep disturbance and suicidal ideas. The patient is not nervous/anxious and is not hyperactive.   All other systems reviewed and are negative.   There were no vitals taken for this visit.There is no height or weight on file to calculate BMI.  General Appearance: Casual  Eye Contact:  Fair  Speech:  Clear and Coherent  Volume:  Normal  Mood:  Euthymic  Affect:  Congruent  Thought Process:  Goal Directed and Descriptions of Associations: Intact  Orientation:  Full (Time, Place, and Person)  Thought Content: Logical   Suicidal Thoughts:  No  Homicidal Thoughts:  No  Memory:  Immediate;   Fair Recent;   Fair Remote;   Fair  Judgement:  Fair  Insight:  Fair  Psychomotor Activity:  Normal  Concentration:  Concentration: Fair and Attention Span: Fair  Recall:  [employer] of Knowledge: Fair  Language: Fair  Akathisia:  No  Handed:  Right  AIMS (if indicated): UTA  Assets:  Communication Skills Desire for Improvement Housing Social Support  ADL's:  Intact  Cognition: WNL  Sleep:  Fair   Screenings:   Assessment and Plan: Braylinn Am is a 64 year old Caucasian female on disability, divorced, lives in [place], has a history of bipolar disorder, chronic pain, diabetes melitis, hypertension, hyperlipidemia was evaluated by telemedicine today.  Patient is  biologically predisposed given her family history, history of trauma.  Patient with psychosocial stressors of her mother's death,  the current pandemic and her aunt who currently struggles with health issues.  Patient however is currently stable on medications.  Plan as noted below.  Plan Bipolar disorder type I manic in remission Lamictal 200 mg p.o. daily Abilify 15 mg p.o. daily Gabapentin 600 mg p.o. 3 times daily   Insomnia likely due to mental health problems-stable She is not compliant on the mirtazapine.  She reports sleep is okay without any medications.  Bereavement-improving We will monitor closely  Follow-up in clinic in 2 to 3 months or sooner if needed.  I have spent atleast 20 minutes non face to face with patient today. More than 50 % of the time was spent for preparing to see the patient ( e.g., review of test, records ), ordering medications and test ,psychoeducation and supportive psychotherapy and care coordination,as well as documenting clinical information in electronic health record. This note was generated in part or whole with voice recognition software. Voice recognition is usually quite accurate but there are transcription errors that can and very often do occur. I apologize for any typographical errors that were not detected and corrected.       Ursula Alert, MD 03/26/2020, 3:46 PM

## 2020-05-06 ENCOUNTER — Other Ambulatory Visit
Admission: RE | Admit: 2020-05-06 | Discharge: 2020-05-06 | Disposition: A | Discharge status: Home / Self Care | Payer: Medicaid Other | Source: Ambulatory Visit | Attending: Internal Medicine | Admitting: Internal Medicine

## 2020-05-06 DIAGNOSIS — E1169 Type 2 diabetes mellitus with other specified complication: Secondary | ICD-10-CM | POA: Insufficient documentation

## 2020-05-06 DIAGNOSIS — F331 Major depressive disorder, recurrent, moderate: Secondary | ICD-10-CM | POA: Insufficient documentation

## 2020-05-06 DIAGNOSIS — K219 Gastro-esophageal reflux disease without esophagitis: Secondary | ICD-10-CM | POA: Insufficient documentation

## 2020-05-06 DIAGNOSIS — M810 Age-related osteoporosis without current pathological fracture: Secondary | ICD-10-CM | POA: Insufficient documentation

## 2020-05-06 DIAGNOSIS — R5381 Other malaise: Secondary | ICD-10-CM | POA: Diagnosis not present

## 2020-05-06 DIAGNOSIS — K760 Fatty (change of) liver, not elsewhere classified: Secondary | ICD-10-CM | POA: Insufficient documentation

## 2020-05-06 DIAGNOSIS — E785 Hyperlipidemia, unspecified: Secondary | ICD-10-CM | POA: Insufficient documentation

## 2020-05-06 LAB — PROTIME-INR
INR: 1.2 (ref 0.8–1.2)
Prothrombin Time: 14.8 seconds (ref 11.4–15.2)

## 2020-05-10 ENCOUNTER — Other Ambulatory Visit: Payer: Self-pay | Admitting: Psychiatry

## 2020-05-10 DIAGNOSIS — F3110 Bipolar disorder, current episode manic without psychotic features, unspecified: Secondary | ICD-10-CM

## 2020-05-27 ENCOUNTER — Telehealth: Payer: Self-pay

## 2020-05-27 NOTE — Telephone Encounter (Signed)
Thank you :)

## 2020-05-27 NOTE — Telephone Encounter (Signed)
Medication management - Telephone call from [place], a pharmacist with [facility] stating pt was switching all her medications over to them and needed a new Citalopram order. Informed pt no longer on this per chart review and to call if questions. Pharmacist agreed to pass this along to the patient and to have her call if she has still been taking.

## 2020-06-06 ENCOUNTER — Other Ambulatory Visit (HOSPITAL_COMMUNITY): Payer: Self-pay | Admitting: Physical Medicine and Rehabilitation

## 2020-06-06 ENCOUNTER — Other Ambulatory Visit (HOSPITAL_COMMUNITY): Payer: Self-pay | Admitting: Physician Assistant

## 2020-06-06 DIAGNOSIS — M5416 Radiculopathy, lumbar region: Secondary | ICD-10-CM

## 2020-06-11 ENCOUNTER — Other Ambulatory Visit: Payer: Self-pay | Admitting: Psychiatry

## 2020-06-11 DIAGNOSIS — Z634 Disappearance and death of family member: Secondary | ICD-10-CM

## 2020-06-12 NOTE — Telephone Encounter (Signed)
I have sent Lamictal to pharmacy.  Patient was tapered off of the Celexa.

## 2020-06-19 NOTE — Progress Notes (Signed)
Called to confirm appt. For 06/20/20. Patient states that she has filledthe history form and will bring tomorrow. I also instructed her to bring in a list of medications.

## 2020-06-20 ENCOUNTER — Encounter: Payer: Self-pay | Admitting: Student in an Organized Health Care Education/Training Program

## 2020-06-20 ENCOUNTER — Other Ambulatory Visit: Payer: Self-pay

## 2020-06-20 ENCOUNTER — Ambulatory Visit
Payer: Medicaid Other | Attending: Student in an Organized Health Care Education/Training Program | Admitting: Student in an Organized Health Care Education/Training Program

## 2020-06-20 VITALS — BP 132/58 | HR 60 | Temp 96.9°F | Ht 61.0 in | Wt 199.0 lb

## 2020-06-20 DIAGNOSIS — M170 Bilateral primary osteoarthritis of knee: Secondary | ICD-10-CM

## 2020-06-20 DIAGNOSIS — Z79891 Long term (current) use of opiate analgesic: Secondary | ICD-10-CM | POA: Diagnosis not present

## 2020-06-20 DIAGNOSIS — M533 Sacrococcygeal disorders, not elsewhere classified: Secondary | ICD-10-CM | POA: Diagnosis not present

## 2020-06-20 DIAGNOSIS — G894 Chronic pain syndrome: Secondary | ICD-10-CM

## 2020-06-20 DIAGNOSIS — Z96642 Presence of left artificial hip joint: Secondary | ICD-10-CM

## 2020-06-20 NOTE — Progress Notes (Signed)
Patient: Margaret [name]  Service Category: E/M  Provider: Gillis Santa, MD  DOB: [birth year]  DOS: 06/20/2020  Referring Provider: Benna Dunks  MRN: [id number]  Setting: Ambulatory outpatient  PCP: Rusty Aus, MD  Type: New Patient  Specialty: Interventional Pain Management    Location: Office  Delivery: Face-to-face     Primary Reason(s) for Visit: Encounter for initial evaluation of one or more chronic problems (new to examiner) potentially causing chronic pain, and posing a threat to normal musculoskeletal function. (Level of risk: High) CC: Hip Pain  HPI  Margaret [name] is a 64 y.o. year old, female patient, who comes for the first time to our practice referred by Mickeal Skinner, PA-C [address] [place],  [place] [id number], for our initial evaluation of her chronic pain. She has Anemia; HTN (hypertension); Diabetes ([place]); CKD (chronic kidney disease), stage III; Bipolar disorder, in partial remission, most recent episode mixed ([place]); Controlled type 2 diabetes mellitus without complication, with long-term current use of insulin ([place]); Encounter for screening colonoscopy; Generalized osteoarthritis; Iron deficiency anemia due to chronic blood loss; 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H (nonalcoholic steatohepatitis) ([place]); Tubular adenoma; Bipolar disorder, in full remission, most recent episode manic ([place]); At risk for long QT syndrome; Insomnia; Anxiety disorder; Insomnia due to mental condition; Bereavement; Chronic pain syndrome; Encounter for long-term opiate analgesic use; Bilateral primary osteoarthritis of knee; Sacroiliac joint pain; and History of left hip replacement on their problem list. Today she comes in for evaluation of her Hip Pain  Pain Assessment: Location: Left Hip (leg) Radiating: pain radiaites down left hip, down left leg to ankle Onset: More than a month ago Duration: Chronic pain Quality: Aching, Sharp, Burning, Discomfort, Throbbing Severity: 8 /10 (subjective, self-reported  pain score)  Effect on ADL: limits my daily activities Timing: Constant Modifying factors: meds, leg message BP: (!) 132/58  HR: 60  Onset and Duration: Gradual and Date of onset: 7 years Cause of pain: hip, iron, legs and feet Severity: Getting worse, NAS-11 at its worse: 8/10, NAS-11 at its best: 7/10, NAS-11 now: 7/10 and NAS-11 on the average: 8/10 Timing: Afternoon and Evening Aggravating Factors: Kneeling, Lifiting, Motion, Prolonged sitting, Prolonged standing, Squatting, Stooping , Walking, Walking uphill and Walking downhill Alleviating Factors: Lying down, Resting, Sitting, Sleeping, TENS, Warm showers or baths and PT Associated Problems: Dizziness, Fatigue, Inability to concentrate, Spasms, Swelling, Weakness and Pain that does not allow patient to sleep Quality of Pain: Aching, Agonizing, Annoying, Disabling, Exhausting, Getting longer, Nagging, Pulsating, Sharp, Shooting, Stabbing and Tingling Previous Examinations or Tests: The patient denies . Previous Treatments: Epidural steroid injections, Narcotic medications, Pool exercises and TENS  Margaret [name] is a pleasant 64 year old female who presents with a chief complaint of left hip pain that radiates into her left buttock and left lateral thigh.  Patient has a history of left hip total arthroplasty that was complicated by postop infection which was done in 2018 in [place].  Patient also has low back pain as well as left greater than right knee pain and right SI joint pain.  [facility] [place]  She lives in [place].  She is hoping to have her pain management care transferred closer to home.  She has had x-rays of her lumbar spine performed in January 2020 that show facet degeneration and arthritis with bilateral knee x-ray showing medial compartmental disease.  She has an lumbar MRI that is ordered that she will get later  this month.  She has not tried epidural steroid injection or  facet joint nerve blocks.  Of note she did have a left sacroiliac joint injection performed on 02/19/2020 which was not effective for her pain.  Her insurance will not cover PT although she has done PT in the past for her left hip and knees.  She did obtain a TENS unit for her left knee on 11/10/2018 and has been responding well to this.  She is currently on hydrocodone 10 mg 3 times daily as needed which does provide her with analgesic benefit.  She has tried buprenorphine in the past which was not effective.   Historic Controlled Substance Pharmacotherapy Review   Hydrocodone 10 mg 3 times daily as needed, quantity 90/month MME equals 30  Historical Monitoring: The patient  reports previous drug use. List of all UDS Test(s): No results found for: MDMA, COCAINSCRNUR, [place], [place], CANNABQUANT, [place], [place] List of other Serum/Urine Drug Screening Test(s):  No results found for: AMPHSCRSER, BARBSCRSER, BENZOSCRSER, COCAINSCRSER, COCAINSCRNUR, PCPSCRSER, PCPQUANT, THCSCRSER, THCU, CANNABQUANT, OPIATESCRSER, OXYSCRSER, PROPOXSCRSER, ETH Historical Background Evaluation: [place] PMP: PDMP not reviewed this encounter. Online review of the past 38-month period conducted.             [place] Department of public safety, offender search: Editor, commissioning Information) Non-contributory Risk Assessment Profile: Aberrant behavior: None observed or detected today Risk factors for fatal opioid overdose: None identified today Fatal overdose hazard ratio (HR): Calculation deferred Non-fatal overdose hazard ratio (HR): Calculation deferred Risk of opioid abuse or dependence: 0.7-3.0% with doses ? 36 MME/day and 6.1-26% with doses ? 120 MME/day. Substance use disorder (SUD) risk level: Moderate however patient does see Dr. Shea Evans with psychiatry for bipolar management  Pharmacologic Plan: As per protocol, I have not taken over any controlled substance management, pending the results of ordered tests and/or consults.             Initial impression: Pending review of available data and ordered tests.  Meds   Current Outpatient Medications:  .  alendronate (FOSAMAX) 35 MG tablet, Take 35 mg by mouth every 7 (seven) days. Take with a full glass of water on an empty stomach., Disp: , Rfl:  .  ARIPiprazole (ABILIFY) 15 MG tablet, Take 1 tablet (15 mg total) by mouth daily., Disp: 90 tablet, Rfl: 0 .  B Complex Vitamins (VITAMIN B-COMPLEX) TABS, Take 1 tablet by mouth daily. , Disp: , Rfl:  .  B Complex-C (B-COMPLEX WITH VITAMIN C) tablet, Take 1 tablet by mouth daily., Disp: 30 tablet, Rfl: 2 .  bisacodyl (DULCOLAX) 5 MG EC tablet, Take 5 mg by mouth daily as needed for moderate constipation., Disp: , Rfl:  .  cetirizine (ZYRTEC) 10 MG tablet, Take 10 mg by mouth daily., Disp: , Rfl:  .  citalopram (CELEXA) 40 MG tablet, Take 40 mg by mouth daily., Disp: , Rfl:  .  Dulaglutide (TRULICITY) 1.27 NT/7.0YF SOPN, Inject 0.75 mg into the skin once a week. Thursday, Disp: , Rfl:  .  furosemide (LASIX) 20 MG tablet, Take 10 mg by mouth daily as needed., Disp: , Rfl:  .  gabapentin (NEURONTIN) 600 MG tablet, TAKE (1) TABLET BY MOUTH (3) TIMES DAILY., Disp: 270 tablet, Rfl: 0 .  glimepiride (AMARYL) 2 MG tablet, Take 2 mg by mouth daily with breakfast. , Disp: , Rfl:  .  HYDROcodone-acetaminophen (NORCO) 10-325 MG tablet, Take 1 tablet by mouth 3 (three) times daily., Disp: , Rfl:  .  Insulin Glargine (LANTUS  SOLOSTAR) 100 UNIT/ML Solostar Pen, Inject 60 Units into the skin at bedtime. , Disp: , Rfl:  .  lamoTRIgine (LAMICTAL) 200 MG tablet, TAKE ONE TABLET BY MOUTH ONCE DAILY., Disp: 90 tablet, Rfl: 0 .  LANTUS SOLOSTAR 100 UNIT/ML Solostar Pen, SMARTSIG:60 Unit(s) SUB-Q Every Night, Disp: , Rfl:  .  losartan (COZAAR) 25 MG tablet, Take 25 mg by mouth daily. Takes differently, Disp: , Rfl:  .  losartan (COZAAR) 50 MG tablet, Take 50 mg by mouth daily., Disp: , Rfl:  .  meclizine (ANTIVERT) 25 MG tablet, Take 25 mg by mouth 2 (two)  times daily as needed., Disp: , Rfl:  .  meloxicam (MOBIC) 7.5 MG tablet, TAKE 1 TABLET BY MOUTH EVERY DAY, Disp: , Rfl:  .  metFORMIN (GLUCOPHAGE) 500 MG tablet, Take 500 mg by mouth 2 (two) times daily with a meal., Disp: , Rfl:  .  pantoprazole (PROTONIX) 40 MG tablet, Take 40 mg by mouth daily., Disp: , Rfl:  .  spironolactone (ALDACTONE) 25 MG tablet, Take 25 mg by mouth daily., Disp: , Rfl:  .  acetaminophen (TYLENOL) 500 MG tablet, Take 500 mg by mouth every 6 (six) hours as needed.  (Patient not taking: Reported on 06/20/2020), Disp: , Rfl:  .  azithromycin (ZITHROMAX) 250 MG tablet, TAKE AS DIRECTED PEROPACKAGE INSTRUCTIONS. (Patient not taking: Reported on 06/20/2020), Disp: , Rfl:  .  clindamycin (CLEOCIN) 150 MG capsule, TAKE 4 CAPSULES 1 HOURABEFORE DENTAL APPOINTMENT. (Patient not taking: Reported on 06/20/2020), Disp: , Rfl:  .  Ertugliflozin L-PyroglutamicAc (STEGLATRO) 15 MG TABS, Take by mouth. (Patient not taking: Reported on 06/20/2020), Disp: , Rfl:  .  ferrous sulfate 325 (65 FE) MG tablet, Take 1 tablet (325 mg total) by mouth 2 (two) times daily with a meal., Disp: 120 tablet, Rfl: 0 .  fluconazole (DIFLUCAN) 100 MG tablet, Take 100 mg by mouth daily. (Patient not taking: Reported on 06/20/2020), Disp: , Rfl:  .  hydrochlorothiazide (HYDRODIURIL) 25 MG tablet, Take 25 mg by mouth daily.  (Patient not taking: Reported on 06/20/2020), Disp: , Rfl:  .  losartan (COZAAR) 50 MG tablet, Take by mouth. (Patient not taking: Reported on 06/20/2020), Disp: , Rfl:  .  mirtazapine (REMERON) 7.5 MG tablet, Take 1 tablet (7.5 mg total) by mouth at bedtime. Mood and sleep (Patient not taking: Reported on 03/26/2020), Disp: 30 tablet, Rfl: 1 .  NOVOFINE 32G X 6 MM MISC, USE TWICE DAILY ASADIRECTEDO (Patient not taking: Reported on 06/20/2020), Disp: , Rfl:  .  propranolol (INDERAL) 10 MG tablet, TAKE 1 TABLET BY MOUTH 3 TIMES DAILY AS NEEDED. FOR SEVERE ANXIETY ATTACKS ONLY (Patient not taking:  Reported on 06/20/2020), Disp: 90 tablet, Rfl: 2  Imaging Review   Ankle Imaging: Ankle-R DG Complete: Results for orders placed during the hospital encounter of 12/03/18  DG Ankle Complete Right  Narrative CLINICAL DATA:  Right ankle pain after fall yesterday.  EXAM: RIGHT ANKLE - COMPLETE 3+ VIEW  COMPARISON:  None.  FINDINGS: There is no evidence of fracture, dislocation, or joint effusion. There is no evidence of arthropathy or other focal bone abnormality. Soft tissues are unremarkable.  IMPRESSION: Negative.   Electronically Signed By: Marijo Conception, M.D. On: 12/03/2018 13:13    Complexity Note: Imaging results reviewed. Results shared with Ms. Caris, using Layman's terms.                         ROS  Cardiovascular: High blood pressure and Heart murmur Pulmonary or Respiratory: Lung problems, Shortness of breath, Smoking and Snoring  Neurological: No reported neurological signs or symptoms such as seizures, abnormal skin sensations, urinary and/or fecal incontinence, being born with an abnormal open spine and/or a tethered spinal cord Psychological-Psychiatric: Psychiatric disorder, Anxiousness, History of abuse and Difficulty sleeping and or falling asleep Gastrointestinal: Vomiting blood (Ulcers), Heartburn due to stomach pushing into lungs (Hiatal hernia), Reflux or heatburn and Scarred liver (Cirrhosis) Genitourinary: Kidney disease Hematological: Brusing easily, Bleeding easily and Coagulation disorder Endocrine: High blood sugar requiring insulin (IDDM) Rheumatologic: Joint aches and or swelling due to excess weight (Osteoarthritis), Rheumatoid arthritis, Generalized muscle aches (Fibromyalgia) and Constant unexplained fatigue (Chronic Fatigue Syndrome) Musculoskeletal: Myasthenia Gravis Work History: Disabled for 16 years  Allergies  Ms. Gulino is allergic to penicillins and sulfa antibiotics.  Laboratory Chemistry Profile   Renal Lab Results   Component Value Date   BUN 11 12/06/2019   CREATININE 0.59 12/06/2019   BCR 19 12/06/2019   GFRAA 114 12/06/2019   GFRNONAA 99 12/06/2019   PROTEINUR NEGATIVE 11/13/2019     Electrolytes Lab Results  Component Value Date   NA 137 12/06/2019   K 3.9 12/06/2019   CL 101 12/06/2019   CALCIUM 9.7 12/06/2019     Hepatic Lab Results  Component Value Date   AST 48 (H) 12/06/2019   ALT 47 (H) 12/06/2019   ALBUMIN 3.7 (L) 12/06/2019   ALKPHOS 122 (H) 12/06/2019     ID Lab Results  Component Value Date   HIV Non Reactive 12/06/2019   SARSCOV2NAA NEGATIVE 06/21/2019     Bone No results found for: VD25OH, JS283TD1VOH, YW7371GG2, IR4854OE7, 25OHVITD1, 25OHVITD2, 25OHVITD3, TESTOFREE, TESTOSTERONE   Endocrine Lab Results  Component Value Date   GLUCOSE 211 (H) 12/06/2019   GLUCOSEU NEGATIVE 11/13/2019   HGBA1C 7.3 (H) 06/22/2019   TSH 1.737 11/16/2019     Neuropathy Lab Results  Component Value Date   VITAMINB12 406 06/22/2019   FOLATE 18.4 06/22/2019   HGBA1C 7.3 (H) 06/22/2019   HIV Non Reactive 12/06/2019     CNS No results found for: COLORCSF, APPEARCSF, RBCCOUNTCSF, WBCCSF, POLYSCSF, LYMPHSCSF, EOSCSF, PROTEINCSF, GLUCCSF, JCVIRUS, CSFOLI, IGGCSF, LABACHR, ACETBL, LABACHR, ACETBL   Inflammation (CRP: Acute  ESR: Chronic) No results found for: CRP, ESRSEDRATE, LATICACIDVEN   Rheumatology Lab Results  Component Value Date   ANA Negative 12/06/2019     Coagulation Lab Results  Component Value Date   INR 1.2 05/06/2020   LABPROT 14.8 05/06/2020   APTT 30 06/21/2019   PLT 117 (L) 11/13/2019     Cardiovascular Lab Results  Component Value Date   CKTOTAL 66 12/06/2019   HGB 13.0 11/13/2019   HCT 40.6 11/13/2019     Screening Lab Results  Component Value Date   SARSCOV2NAA NEGATIVE 06/21/2019   HIV Non Reactive 12/06/2019     Cancer No results found for: CEA, CA125, LABCA2   Allergens No results found for: ALMOND, APPLE, ASPARAGUS, AVOCADO,  BANANA, BARLEY, BASIL, BAYLEAF, GREENBEAN, LIMABEAN, WHITEBEAN, BEEFIGE, REDBEET, BLUEBERRY, BROCCOLI, CABBAGE, MELON, CARROT, CASEIN, CASHEWNUT, CAULIFLOWER, CELERY     Note: Lab results reviewed.  PFSH  Drug: Ms. Prew  reports previous drug use. Alcohol:  reports previous alcohol use. Tobacco:  reports that she quit smoking about 9 years ago. Her smoking use included cigarettes. She has never used smokeless tobacco. Medical:  has a past medical history of Bipolar 1 disorder ([place]), Diabetes mellitus without complication ([place]), and  Hypertension. Family: family history includes Bipolar disorder in her brother, paternal uncle, and paternal uncle; Mental illness in her father.  Past Surgical History:  Procedure Laterality Date  . ABDOMINAL HYSTERECTOMY    . CHOLECYSTECTOMY    . COLONOSCOPY WITH PROPOFOL N/A 06/23/2019   Procedure: COLONOSCOPY WITH PROPOFOL;  Surgeon: Jonathon Bellows, MD;  Location: [facility] ENDOSCOPY;  Service: Gastroenterology;  Laterality: N/A;  . ESOPHAGOGASTRODUODENOSCOPY (EGD) WITH PROPOFOL N/A 06/23/2019   Procedure: ESOPHAGOGASTRODUODENOSCOPY (EGD) WITH PROPOFOL;  Surgeon: Jonathon Bellows, MD;  Location: [facility] ENDOSCOPY;  Service: Gastroenterology;  Laterality: N/A;  . TONSILLECTOMY     Active Ambulatory Problems    Diagnosis Date Noted  . Anemia 06/21/2019  . HTN (hypertension) 06/21/2019  . Diabetes ([place]) 06/21/2019  . CKD (chronic kidney disease), stage III 06/21/2019  . Bipolar disorder, in partial remission, most recent episode mixed ([place]) 06/21/2019  . Controlled type 2 diabetes mellitus without complication, with long-term current use of insulin ([place]) 06/21/2019  . Encounter for screening colonoscopy 07/19/2018  . Generalized osteoarthritis 06/21/2019  . Iron deficiency anemia due to chronic blood loss 07/04/2019  . 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H (nonalcoholic steatohepatitis) ([place]) 07/04/2019  . Tubular adenoma 07/04/2019  . Bipolar disorder, in full remission,  most recent episode manic ([place]) 08/21/2019  . At risk for long QT syndrome 08/21/2019  . Insomnia 08/21/2019  . Anxiety disorder 11/14/2019  . Insomnia due to mental condition 11/14/2019  . Bereavement 01/11/2020  . Chronic pain syndrome 06/20/2020  . Encounter for long-term opiate analgesic use 06/20/2020  . Bilateral primary osteoarthritis of knee 06/20/2020  . Sacroiliac joint pain 06/20/2020  . History of left hip replacement 06/20/2020   Resolved Ambulatory Problems    Diagnosis Date Noted  . No Resolved Ambulatory Problems   Past Medical History:  Diagnosis Date  . Bipolar 1 disorder ([place])   . Diabetes mellitus without complication ([place])   . Hypertension    Constitutional Exam  General appearance: Well nourished, well developed, and well hydrated. In no apparent acute distress Vitals:   06/20/20 1004  BP: (!) 132/58  Pulse: 60  Temp: (!) 96.9 F (36.1 C)  SpO2: 99%  Weight: 199 lb (90.3 kg)  Height: _0  (1.549 m)   BMI Assessment: Estimated body mass index is 37.6 kg/m as calculated from the following:   Height as of this encounter: _1  (1.549 m).   Weight as of this encounter: 199 lb (90.3 kg).  BMI interpretation table: BMI level Category Range association with higher incidence of chronic pain  <18 kg/m2 Underweight   18.5-24.9 kg/m2 Ideal body weight   25-29.9 kg/m2 Overweight Increased incidence by 20%  30-34.9 kg/m2 Obese (Class I) Increased incidence by 68%  35-39.9 kg/m2 Severe obesity (Class II) Increased incidence by 136%  >40 kg/m2 Extreme obesity (Class III) Increased incidence by 254%   Patient's current BMI Ideal Body weight  Body mass index is 37.6 kg/m. Ideal body weight: 47.8 kg (105 lb 6.1 oz) Adjusted ideal body weight: 64.8 kg (142 lb 13.2 oz)   BMI Readings from Last 4 Encounters:  06/20/20 37.60 kg/m  12/06/19 35.50 kg/m  11/16/19 35.80 kg/m  07/19/19 34.20 kg/m   Wt Readings from Last 4 Encounters:  06/20/20 199 lb (90.3  kg)  12/06/19 191 lb (86.6 kg)  11/16/19 192 lb 9.6 oz (87.4 kg)  07/19/19 184 lb (83.5 kg)    Psych/Mental status: Alert, oriented x 3 (person, place, & time)  Eyes: PERLA Respiratory: No evidence of acute respiratory distress           Thoracic Spine Area Exam  Skin & Axial Inspection: No masses, redness, or swelling Alignment: Symmetrical Functional ROM: Unrestricted ROM Stability: No instability detected Muscle Tone/Strength: Functionally intact. No obvious neuro-muscular anomalies detected. Sensory (Neurological): Unimpaired Muscle strength & Tone: No palpable anomalies  Lumbar Exam  Skin & Axial Inspection: No masses, redness, or swelling Alignment: Symmetrical Functional ROM: Pain restricted ROM affecting both sides Stability: No instability detected Muscle Tone/Strength: Functionally intact. No obvious neuro-muscular anomalies detected. Sensory (Neurological): Musculoskeletal pain pattern  Provocative Tests: Hyperextension/rotation test: (+) bilaterally for facet joint pain. Lumbar quadrant test (Kemp's test): (+) bilaterally for facet joint pain. Lateral bending test: (+) due to pain. Patrick's Maneuver: deferred today                   FABER* test: deferred today                   S-I anterior distraction/compression test: deferred today         S-I lateral compression test: deferred today         S-I Thigh-thrust test: deferred today         S-I Gaenslen's test: deferred today         *(Flexion, ABduction and External Rotation)  Gait & Posture Assessment  Ambulation: Patient came in today in a wheel chair Gait: Limited. Using assistive device to ambulate Posture: Antalgic   Lower Extremity Exam    Side: Right lower extremity  Side: Left lower extremity  Stability: No instability observed          Stability: No instability observed          Skin & Extremity Inspection: Skin color, temperature, and hair growth are WNL. No peripheral edema or cyanosis.  No masses, redness, swelling, asymmetry, or associated skin lesions. No contractures.  Skin & Extremity Inspection: Skin color, temperature, and hair growth are WNL. No peripheral edema or cyanosis. No masses, redness, swelling, asymmetry, or associated skin lesions. No contractures.  Functional ROM: Pain restricted ROM for knee joint          Functional ROM: Pain restricted ROM for hip and knee joints          Muscle Tone/Strength: Functionally intact. No obvious neuro-muscular anomalies detected.  Muscle Tone/Strength: Functionally intact. No obvious neuro-muscular anomalies detected.  Sensory (Neurological): Arthropathic arthralgia        Sensory (Neurological): Arthropathic arthralgia        DTR: Patellar: deferred today Achilles: deferred today Plantar: deferred today  DTR: Patellar: deferred today Achilles: deferred today Plantar: deferred today  Palpation: No palpable anomalies  Palpation: No palpable anomalies   Assessment  Primary Diagnosis & Pertinent Problem List: The primary encounter diagnosis was Chronic pain syndrome. Diagnoses of Encounter for long-term opiate analgesic use, Bilateral primary osteoarthritis of knee, Sacroiliac joint pain, and History of left hip replacement were also pertinent to this visit.  Visit Diagnosis (New problems to examiner): 1. Chronic pain syndrome   2. Encounter for long-term opiate analgesic use   3. Bilateral primary osteoarthritis of knee   4. Sacroiliac joint pain   5. History of left hip replacement    Plan of Care (Initial workup plan)  Note: Ms. Wechter was reminded that as per protocol, today's visit has been an evaluation only. We have not taken over the patient's controlled substance management.   Lab Orders  Compliance Drug Analysis, Ur  1.  Recommend patient continue with lumbar MRI.  She states that she is having it done at [facility].  Will discuss results with her at her next visit and develop  treatment plan which may include lumbar epidural steroid injection, lumbar facet medial branch nerve blocks, radiofrequency ablation, stimulation procedure  2.  Chronic pain syndrome on long-term opioid therapy, hydrocodone 10 mg 3 times daily as needed.  [facility] however the drive for the patient is too long and she would like to transfer her care here.  She sees Dr. Shea Evans for bipolar and psychiatric management.  Urine toxicology screen and so long as this is appropriate, can take over the patient for chronic opioid therapy.  She will sign pain contract at that time  Interventional management options: Ms. Ballester was informed that there is no guarantee that she would be a candidate for interventional therapies. The decision will be based on the results of diagnostic studies, as well as Ms. Shelnutt's risk profile.  Procedure(s) under consideration:  Pending lumbar MRI: Lumbar epidural steroid injection Lumbar facet medial branch nerve block possible RFA Knee steroid versus Hyalgan injection Genicular nerve block   Provider-requested follow-up: Return in about 2 weeks (around 07/04/2020) for Medication Management, in person.  Future Appointments  Date Time Provider [place]  06/25/2020 10:00 AM [facility]-MR 1 [facility] [place] [facility]  06/27/2020 11:00 AM Ursula Alert, MD [facility] None    Note by: Gillis Santa, MD Date: 06/20/2020; Time: 10:41 AM

## 2020-06-24 LAB — COMPLIANCE DRUG ANALYSIS, UR

## 2020-06-25 ENCOUNTER — Encounter (HOSPITAL_COMMUNITY): Payer: Self-pay

## 2020-06-25 ENCOUNTER — Ambulatory Visit (HOSPITAL_COMMUNITY): Payer: Medicaid Other

## 2020-06-27 ENCOUNTER — Telehealth (INDEPENDENT_AMBULATORY_CARE_PROVIDER_SITE_OTHER): Payer: Medicaid Other | Admitting: Psychiatry

## 2020-06-27 ENCOUNTER — Other Ambulatory Visit: Payer: Self-pay

## 2020-06-27 ENCOUNTER — Encounter: Payer: Self-pay | Admitting: Psychiatry

## 2020-06-27 ENCOUNTER — Telehealth: Payer: Self-pay

## 2020-06-27 DIAGNOSIS — R259 Unspecified abnormal involuntary movements: Secondary | ICD-10-CM

## 2020-06-27 DIAGNOSIS — F3174 Bipolar disorder, in full remission, most recent episode manic: Secondary | ICD-10-CM | POA: Diagnosis not present

## 2020-06-27 DIAGNOSIS — Z634 Disappearance and death of family member: Secondary | ICD-10-CM | POA: Diagnosis not present

## 2020-06-27 DIAGNOSIS — F5105 Insomnia due to other mental disorder: Secondary | ICD-10-CM

## 2020-06-27 DIAGNOSIS — M279 Disease of jaws, unspecified: Secondary | ICD-10-CM | POA: Insufficient documentation

## 2020-06-27 DIAGNOSIS — M2550 Pain in unspecified joint: Secondary | ICD-10-CM | POA: Insufficient documentation

## 2020-06-27 DIAGNOSIS — M545 Low back pain, unspecified: Secondary | ICD-10-CM | POA: Insufficient documentation

## 2020-06-27 MED ORDER — ARIPIPRAZOLE 15 MG PO TABS: 7.5000 mg | ORAL_TABLET | Freq: Every day | ORAL | 0 refills | Status: DC

## 2020-06-27 NOTE — Progress Notes (Signed)
Provider Location : [facility] Patient Location : Home  Participants: Patient , Provider  Virtual Visit via Telephone Note  I connected with Margaret [name] on 06/27/20 at 11:00 AM EDT by telephone and verified that I am speaking with the correct person using two identifiers.   I discussed the limitations, risks, security and privacy concerns of performing an evaluation and management service by telephone and the availability of in person appointments. I also discussed with the patient that there may be a patient responsible charge related to this service. The patient expressed understanding and agreed to proceed.     I discussed the assessment and treatment plan with the patient. The patient was provided an opportunity to ask questions and all were answered. The patient agreed with the plan and demonstrated an understanding of the instructions.   The patient was advised to call back or seek an in-person evaluation if the symptoms worsen or if the condition fails to improve as anticipated.    [facility] MD OP Progress Note  06/27/2020 12:15 PM Margaret [name]  MRN:  [id number]  Chief Complaint:  Chief Complaint    Follow-up     HPI: Margaret [name] is a 64 year old Caucasian female on disability, divorced ,lives in [place], has a history of bipolar disorder, hyperlipidemia, insomnia, diabetes melitis, chronic pain, hypertension was evaluated by telemedicine today.  Patient today reports she is currently doing well.  She denies any mood swings.  She denies any significant anxiety attacks.  She reports sleep is good.  She takes melatonin and is not compliant on the mirtazapine.  Patient reports she is compliant on all her medications.  She does report occasional tremors of her bilateral hands and wonders if the medication could be contributing to it.  Patient denies any suicidality, homicidality or perceptual disturbances.  Patient denies any other concerns today.    Visit Diagnosis:     ICD-10-CM   1. Bipolar disorder, in full remission, most recent episode manic (HCC)  F31.74 ARIPiprazole (ABILIFY) 15 MG tablet  2. Bereavement  Z63.4   3. Insomnia due to mental condition  F51.05   4. Abnormal involuntary movement  R25.9     Past Psychiatric History: I have reviewed past psychiatric history from my progress note on 08/21/2019.  Past trials of Depakote, lithium, risperidone, Xanax.  Past Medical History:  Past Medical History:  Diagnosis Date   Bipolar 1 disorder ([place])    Diabetes mellitus without complication ([place])    Type 2   Hypertension     Past Surgical History:  Procedure Laterality Date   ABDOMINAL HYSTERECTOMY     CHOLECYSTECTOMY     COLONOSCOPY WITH PROPOFOL N/A 06/23/2019   Procedure: COLONOSCOPY WITH PROPOFOL;  Surgeon: Jonathon Bellows, MD;  Location: [facility] ENDOSCOPY;  Service: Gastroenterology;  Laterality: N/A;   ESOPHAGOGASTRODUODENOSCOPY (EGD) WITH PROPOFOL N/A 06/23/2019   Procedure: ESOPHAGOGASTRODUODENOSCOPY (EGD) WITH PROPOFOL;  Surgeon: Jonathon Bellows, MD;  Location: [facility] ENDOSCOPY;  Service: Gastroenterology;  Laterality: N/A;   TONSILLECTOMY      Family Psychiatric History: I have reviewed family psychiatric history from my progress note on 08/21/2019.  Family History:  Family History  Problem Relation Age of Onset   Mental illness Father    Bipolar disorder Brother    Bipolar disorder Paternal Uncle    Bipolar disorder Paternal Uncle     Social History: I have reviewed social history from my progress note on 08/21/2019 Social History   Socioeconomic History   Marital status: Divorced    Spouse  name: Not on file   Number of children: 2   Years of education: Not on file   Highest education level: Not on file  Occupational History   Not on file  Tobacco Use   Smoking status: Former Smoker    Types: Cigarettes    Quit date: 03/03/2011    Years since quitting: 9.3   Smokeless tobacco: Never Used  Vaping Use   Vaping  Use: Never used  Substance and Sexual Activity   Alcohol use: Not Currently   Drug use: Not Currently   Sexual activity: Not on file  Other Topics Concern   Not on file  Social History Narrative   Not on file   Social Determinants of Health   Financial Resource Strain:    Difficulty of Paying Living Expenses: Not on file  Food Insecurity:    Worried About [place] in the Last Year: Not on file   [place] in the Last Year: Not on file  Transportation Needs:    Lack of Transportation (Medical): Not on file   Lack of Transportation (Non-Medical): Not on file  Physical Activity:    Days of Exercise per Week: Not on file   Minutes of Exercise per Session: Not on file  Stress:    Feeling of Stress : Not on file  Social Connections:    Frequency of Communication with Friends and Family: Not on file   Frequency of Social Gatherings with Friends and Family: Not on file   Attends Religious Services: Not on file   Active Member of Clubs or Organizations: Not on file   Attends Archivist Meetings: Not on file   Marital Status: Not on file    Allergies:  Allergies  Allergen Reactions   Lithium Nausea And Vomiting   Penicillins Hives    Resulted in hospitalization   Sulfa Antibiotics Hives    Resulted in hospitalization    Metabolic Disorder Labs: Lab Results  Component Value Date   HGBA1C 7.3 (H) 06/22/2019   MPG 162.81 06/22/2019   No results found for: PROLACTIN No results found for: CHOL, TRIG, HDL, CHOLHDL, VLDL, LDLCALC Lab Results  Component Value Date   TSH 1.737 11/16/2019    Therapeutic Level Labs: No results found for: LITHIUM No results found for: VALPROATE No components found for:  CBMZ  Current Medications: Current Outpatient Medications  Medication Sig Dispense Refill   bisoprolol-hydrochlorothiazide (ZIAC) 5-6.25 MG tablet Take 1 tablet by mouth daily.     acetaminophen (TYLENOL) 500 MG tablet Take  500 mg by mouth every 6 (six) hours as needed.  (Patient not taking: Reported on 06/20/2020)     alendronate (FOSAMAX) 35 MG tablet Take 35 mg by mouth every 7 (seven) days. Take with a full glass of water on an empty stomach.     amLODipine (NORVASC) 10 MG tablet Take 10 mg by mouth daily.     ARIPiprazole (ABILIFY) 15 MG tablet Take 0.5 tablets (7.5 mg total) by mouth daily. 45 tablet 0   azithromycin (ZITHROMAX) 250 MG tablet TAKE AS DIRECTED PEROPACKAGE INSTRUCTIONS. (Patient not taking: Reported on 06/20/2020)     B Complex Vitamins (VITAMIN B-COMPLEX) TABS Take 1 tablet by mouth daily.      B Complex-C (B-COMPLEX WITH VITAMIN C) tablet Take 1 tablet by mouth daily. 30 tablet 2   bisacodyl (DULCOLAX) 5 MG EC tablet Take 5 mg by mouth daily as needed for moderate constipation.  cetirizine (ZYRTEC) 10 MG tablet Take 10 mg by mouth daily.     clindamycin (CLEOCIN) 150 MG capsule TAKE 4 CAPSULES 1 HOURABEFORE DENTAL APPOINTMENT. (Patient not taking: Reported on 06/20/2020)     cloNIDine (CATAPRES) 0.1 MG tablet clonidine HCl 0.1 mg tablet  TAKE 1 TABLET BY MOUTH 2 TIMES A DAY     Dulaglutide (TRULICITY) 7.82 NF/6.2ZH SOPN Inject 0.75 mg into the skin once a week. Thursday     Ertugliflozin L-PyroglutamicAc (STEGLATRO) 15 MG TABS Take by mouth. (Patient not taking: Reported on 06/20/2020)     ferrous sulfate 325 (65 FE) MG tablet Take 1 tablet (325 mg total) by mouth 2 (two) times daily with a meal. 120 tablet 0   fluconazole (DIFLUCAN) 100 MG tablet Take 100 mg by mouth daily. (Patient not taking: Reported on 06/20/2020)     furosemide (LASIX) 20 MG tablet Take 10 mg by mouth daily as needed.     gabapentin (NEURONTIN) 600 MG tablet TAKE (1) TABLET BY MOUTH (3) TIMES DAILY. 270 tablet 0   glimepiride (AMARYL) 2 MG tablet Take 2 mg by mouth daily with breakfast.      hydrochlorothiazide (HYDRODIURIL) 25 MG tablet Take 25 mg by mouth daily.  (Patient not taking: Reported on  06/20/2020)     HYDROcodone-acetaminophen (NORCO) 10-325 MG tablet Take 1 tablet by mouth 3 (three) times daily.     Insulin Glargine (LANTUS SOLOSTAR) 100 UNIT/ML Solostar Pen Inject 60 Units into the skin at bedtime.      lamoTRIgine (LAMICTAL) 200 MG tablet TAKE ONE TABLET BY MOUTH ONCE DAILY. 90 tablet 0   LANTUS SOLOSTAR 100 UNIT/ML Solostar Pen SMARTSIG:60 Unit(s) SUB-Q Every Night     losartan (COZAAR) 25 MG tablet Take 25 mg by mouth daily. Takes differently     losartan (COZAAR) 50 MG tablet Take 50 mg by mouth daily.     losartan (COZAAR) 50 MG tablet Take by mouth. (Patient not taking: Reported on 06/20/2020)     meclizine (ANTIVERT) 25 MG tablet Take 25 mg by mouth 2 (two) times daily as needed.     meloxicam (MOBIC) 7.5 MG tablet TAKE 1 TABLET BY MOUTH EVERY DAY     metFORMIN (GLUCOPHAGE) 500 MG tablet Take 500 mg by mouth 2 (two) times daily with a meal.     NOVOFINE 32G X 6 MM MISC USE TWICE DAILY ASADIRECTEDO (Patient not taking: Reported on 06/20/2020)     pantoprazole (PROTONIX) 40 MG tablet Take 40 mg by mouth daily.     propranolol (INDERAL) 10 MG tablet TAKE 1 TABLET BY MOUTH 3 TIMES DAILY AS NEEDED. FOR SEVERE ANXIETY ATTACKS ONLY (Patient not taking: Reported on 06/20/2020) 90 tablet 2   spironolactone (ALDACTONE) 25 MG tablet Take 25 mg by mouth daily.     No current facility-administered medications for this visit.     Musculoskeletal: Strength & Muscle Tone: UTA Gait & Station: UTA Patient leans: N/A  Psychiatric Specialty Exam: Review of Systems  Neurological: Positive for tremors.  Psychiatric/Behavioral: Negative for agitation, behavioral problems, confusion, decreased concentration, dysphoric mood, hallucinations, self-injury, sleep disturbance and suicidal ideas. The patient is not nervous/anxious and is not hyperactive.   All other systems reviewed and are negative.   There were no vitals taken for this visit.There is no height or weight on  file to calculate BMI.  General Appearance: UTA  Eye Contact:  UTA  Speech:  Clear and Coherent  Volume:  Normal  Mood:  Euthymic  Affect:  UTA  Thought Process:  Goal Directed and Descriptions of Associations: Intact  Orientation:  Full (Time, Place, and Person)  Thought Content: Logical   Suicidal Thoughts:  No  Homicidal Thoughts:  No  Memory:  Immediate;   Fair Recent;   Fair Remote;   Fair  Judgement:  Fair  Insight:  Fair  Psychomotor Activity:  Reports tremors of hands on and off  Concentration:  Concentration: Fair and Attention Span: Fair  Recall:  [employer] of Knowledge: Fair  Language: Fair  Akathisia:  No  Handed:  Right  AIMS (if indicated):UTA  Assets:  Communication Skills Desire for Improvement Housing Social Support  ADL's:  Intact  Cognition: WNL  Sleep:  Fair   Screenings:   Assessment and Plan: Margaret [name] is a 64 year old Caucasian female on disability, divorced, lives in [place], has a history of bipolar disorder, chronic pain, diabetes melitis, hypertension, hyperlipidemia was evaluated by telemedicine today.  Patient is biologically predisposed given her family history, history of trauma.  Patient with psychosocial stressors of her mother's death, current pandemic and her aunt who currently struggles with health issues.  Patient is currently stable on medications.  Patient does report possible tremors, likely medication induced.  Plan as noted below.  Plan Bipolar disorder type I manic in remission Lamictal 200 mg p.o. daily Will reduce Abilify to 7.5 mg po daily due to tremors. Gabapentin 600 mg p.o. 3 times daily.  Insomnia likely due to mental health problems-stable She is not compliant on the mirtazapine.  She reports sleep is okay.  We will monitor closely.  Bereavement-improving We will monitor closely  Abnormal involuntary movement- unstable Reduce Abilify to 7.5 mg p.o. daily. We will monitor her closely. If she continues to  have tremors or if they worsen will refer to neurology.  This was discussed with patient and she is agreeable.  Follow-up in clinic in 1-2 months or sooner if needed.  I have spent atleast 20 minutes non face to face with patient today. More than 50 % of the time was spent for preparing to see the patient ( e.g., review of test, records ), ordering medications and test ,psychoeducation and supportive psychotherapy and care coordination,as well as documenting clinical information in electronic health record. This note was generated in part or whole with voice recognition software. Voice recognition is usually quite accurate but there are transcription errors that can and very often do occur. I apologize for any typographical errors that were not detected and corrected.      Ursula Alert, MD 06/27/2020, 12:15 PM

## 2020-06-27 NOTE — Telephone Encounter (Signed)
submitted prior auth to [email or web address]- pending

## 2020-06-27 NOTE — Telephone Encounter (Signed)
prior auth needed for the aripiprazole

## 2020-06-27 NOTE — Telephone Encounter (Signed)
approved pa case # [id number]  from 06-27-20 to 06-27-21

## 2020-07-09 ENCOUNTER — Telehealth: Payer: Self-pay

## 2020-07-09 NOTE — Telephone Encounter (Signed)
pt called states that the medications needs to be changed it is not agreeing with her is there some thing else

## 2020-07-09 NOTE — Telephone Encounter (Signed)
Returned call to patient, left voicemail. 

## 2020-07-10 ENCOUNTER — Telehealth: Payer: Self-pay

## 2020-07-10 DIAGNOSIS — F3174 Bipolar disorder, in full remission, most recent episode manic: Secondary | ICD-10-CM

## 2020-07-10 MED ORDER — LAMOTRIGINE 25 MG PO TABS: 25.0000 mg | ORAL_TABLET | Freq: Every day | ORAL | 1 refills | Status: DC

## 2020-07-10 MED ORDER — ARIPIPRAZOLE 15 MG PO TABS: 15.0000 mg | ORAL_TABLET | Freq: Every day | ORAL | 0 refills | Status: DC

## 2020-07-10 NOTE — Telephone Encounter (Signed)
pt called left message that she is having issues with the lamitcal and she wanted to speak with dr. Shea Evans about making changes.

## 2020-07-10 NOTE — Telephone Encounter (Signed)
Returned call to patient.  She reports she is having mood swings, crying spells.  She hence went back on the Abilify to 15 mg yesterday.  When she reduced the Abilify dosage to 7.5 her tremors went away.  She however reports she wants to try the 15 mg again.  Discussed with patient to stay on the 15 mg for now.  However if tremors returns advised her to call writer back.  In the meantime will increase her Lamictal to 225 mg p.o. daily.

## 2020-07-16 ENCOUNTER — Telehealth: Payer: Self-pay

## 2020-07-16 NOTE — Telephone Encounter (Signed)
-----   Message from Rushie Chestnut, [place] sent at 01/22/2020  9:19 AM EDT ----- Regarding: Pt due for liver U/S Pt is due for RUQ U/S of liver to screen for [facility] in September.

## 2020-07-16 NOTE — Telephone Encounter (Signed)
Spoke with pt and reminded her that she's due for her RUQ ultrasound. Pt states she'd like to hold off on scheduling for another month or so as she has a lot going on at this time.

## 2020-07-18 ENCOUNTER — Ambulatory Visit: Payer: Medicaid Other

## 2020-07-26 ENCOUNTER — Ambulatory Visit (HOSPITAL_COMMUNITY): Payer: Medicaid Other

## 2020-07-30 ENCOUNTER — Ambulatory Visit
Payer: Medicaid Other | Attending: Student in an Organized Health Care Education/Training Program | Admitting: Student in an Organized Health Care Education/Training Program

## 2020-07-30 ENCOUNTER — Other Ambulatory Visit: Payer: Self-pay

## 2020-07-30 ENCOUNTER — Encounter: Payer: Self-pay | Admitting: Student in an Organized Health Care Education/Training Program

## 2020-07-30 VITALS — BP 108/58 | HR 58 | Temp 97.1°F | Resp 18 | Ht 61.0 in | Wt 200.0 lb

## 2020-07-30 DIAGNOSIS — G8929 Other chronic pain: Secondary | ICD-10-CM | POA: Insufficient documentation

## 2020-07-30 DIAGNOSIS — Z0289 Encounter for other administrative examinations: Secondary | ICD-10-CM | POA: Diagnosis not present

## 2020-07-30 DIAGNOSIS — M47816 Spondylosis without myelopathy or radiculopathy, lumbar region: Secondary | ICD-10-CM | POA: Diagnosis present

## 2020-07-30 DIAGNOSIS — M5410 Radiculopathy, site unspecified: Secondary | ICD-10-CM | POA: Diagnosis not present

## 2020-07-30 DIAGNOSIS — M170 Bilateral primary osteoarthritis of knee: Secondary | ICD-10-CM | POA: Diagnosis present

## 2020-07-30 DIAGNOSIS — G894 Chronic pain syndrome: Secondary | ICD-10-CM | POA: Diagnosis not present

## 2020-07-30 DIAGNOSIS — Z79891 Long term (current) use of opiate analgesic: Secondary | ICD-10-CM | POA: Diagnosis not present

## 2020-07-30 DIAGNOSIS — M5412 Radiculopathy, cervical region: Secondary | ICD-10-CM | POA: Insufficient documentation

## 2020-07-30 MED ORDER — HYDROCODONE-ACETAMINOPHEN 10-325 MG PO TABS: 1.0000 | ORAL_TABLET | Freq: Three times a day (TID) | ORAL | 0 refills | Status: AC

## 2020-07-30 MED ORDER — HYDROCODONE-ACETAMINOPHEN 10-325 MG PO TABS: 1.0000 | ORAL_TABLET | Freq: Three times a day (TID) | ORAL | 0 refills | Status: DC

## 2020-07-30 NOTE — Patient Instructions (Signed)

## 2020-07-30 NOTE — Progress Notes (Signed)
Safety precautions to be maintained throughout the outpatient stay will include: orient to surroundings, keep bed in low position, maintain call bell within reach at all times, provide assistance with transfer out of bed and ambulation.  

## 2020-07-30 NOTE — Progress Notes (Signed)
PROVIDER NOTE: Information contained herein reflects review and annotations entered in association with encounter. Interpretation of such information and data should be left to medically-trained personnel. Information provided to patient can be located elsewhere in the medical record under "Patient Instructions". Document created using STT-dictation technology, any transcriptional errors that may result from process are unintentional.    Patient: Margaret [name]  Service Category: E/M  Provider: Gillis Santa, MD  DOB: [birth year]  DOS: 07/30/2020  Specialty: Interventional Pain Management  MRN: [id number]  Setting: Ambulatory outpatient  PCP: Rusty Aus, MD  Type: Established Patient    Referring Provider: Rusty Aus, MD  Location: Office  Delivery: Face-to-face     Primary Reason(s) for Visit: Encounter for evaluation before starting new chronic pain management plan of care (Level of risk: moderate) CC: Leg Pain (bilateral) and Hip Pain (left)  HPI  Margaret [name] is a 64 y.o. year old, female patient, who comes today for a follow-up evaluation to review the test results and decide on a treatment plan. She has Anemia; HTN (hypertension); Diabetes ([place]); CKD (chronic kidney disease), stage III; Bipolar disorder, in partial remission, most recent episode mixed ([place]); Controlled type 2 diabetes mellitus without complication, with long-term current use of insulin ([facility]); Encounter for screening colonoscopy; Generalized osteoarthritis; Iron deficiency anemia due to chronic blood loss; 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H (nonalcoholic steatohepatitis) ([place]); Tubular adenoma; Bipolar disorder, in full remission, most recent episode manic ([place]); At risk for long QT syndrome; Insomnia; Anxiety disorder; Insomnia due to mental condition; Bereavement; Chronic pain syndrome; Encounter for long-term opiate analgesic use; Bilateral primary osteoarthritis of knee; Sacroiliac joint pain; History of left hip replacement;  Steatosis of liver; Recurrent major depressive episodes, moderate ([place]); Osteoporosis; Low back pain; Joint pain; Hyperlipidemia; Gastroesophageal reflux disease without esophagitis; Disorder of jaw; Abnormal involuntary movement; Radicular pain of left lower extremity; Pain management contract signed; and Lumbar facet arthropathy on their problem list. Her primarily concern today is the Leg Pain (bilateral) and Hip Pain (left)  Pain Assessment: Location: Right, Left Leg Radiating: pain from thigh to ankles Onset: More than a month ago Duration: Chronic pain Quality: Aching Severity: 8 /10 (subjective, self-reported pain score)  Effect on ADL: limits activities Timing: Constant Modifying factors: medication BP: (!) 108/58  HR: (!) 58  Margaret [name] comes in today for a follow-up visit after her initial evaluation on 06/20/2020. Today we went over the results of her tests. These were explained in "Layman's terms". During today's appointment we went over my diagnostic impression, as well as the proposed treatment plan.  HPI from initial visit: Margaret [name] is a pleasant 64 year old female who presents with a chief complaint of left hip pain that radiates into her left buttock and left lateral thigh.  Patient has a history of left hip total arthroplasty that was complicated by postop infection which was done in 2018 in [place].  Patient also has low back pain as well as left greater than right knee pain and right SI joint pain.  [facility] [place].  She lives in [place].  She is hoping to have her pain management care transferred closer to home.  She has had x-rays of her lumbar spine performed in January 2020 that show facet degeneration and arthritis with bilateral knee x-ray showing medial compartmental disease.  She has an lumbar MRI that is ordered that she will get later this month.  She has not tried epidural steroid injection or  facet joint nerve  blocks.  Of note she did have a left sacroiliac joint injection performed on 02/19/2020 which was not effective for her pain.  Her insurance will not cover PT although she has done PT in the past for her left hip and knees.  She did obtain a TENS unit for her left knee on 11/10/2018 and has been responding well to this.  She is currently on hydrocodone 10 mg 3 times daily as needed which does provide her with analgesic benefit.  She has tried buprenorphine in the past which was not effective.  No change in history since her initial visit.  An MRI that was ordered by another provider was denied by the patient's insurance company.  Will reorder today as I think it is important to get an updated lumbar MRI to evaluate her symptoms of lower extremity weakness, numbness and tingling.  She continues to endorse bilateral knee pain related to knee osteoarthritis.  We discussed intra-articular knee Hyalgan injections for her knee pain.  UDS up-to-date and appropriate.  We will have patient sign pain contract and take over her chronic opioid therapy as below.  Controlled Substance Pharmacotherapy Assessment REMS (Risk Evaluation and Mitigation Strategy)  Analgesic: Norco 10 mg TID prn  Pill Count: None expected due to no prior prescriptions written by our practice. Dewayne Shorter, RN  07/30/2020  9:56 AM  Signed Safety precautions to be maintained throughout the outpatient stay will include: orient to surroundings, keep bed in low position, maintain call bell within reach at all times, provide assistance with transfer out of bed and ambulation.   Pharmacokinetics: Liberation and absorption (onset of action): WNL Distribution (time to peak effect): WNL Metabolism and excretion (duration of action): WNL         Pharmacodynamics: Desired effects: Analgesia: Margaret [name] reports >50% benefit. Functional ability: Patient reports that medication allows her to accomplish basic ADLs Clinically meaningful improvement in  function (CMIF): Sustained CMIF goals met Perceived effectiveness: Described as relatively effective, allowing for increase in activities of daily living (ADL) Undesirable effects: Side-effects or Adverse reactions: None reported Monitoring: [place] PMP: PDMP not reviewed this encounter. Online review of the past 32-monthperiod previously conducted. Not applicable at this point since we have not taken over the patient's medication management yet. List of other Serum/Urine Drug Screening Test(s):  No results found for: AMPHSCRSER, BARBSCRSER, BENZOSCRSER, COCAINSCRSER, COCAINSCRNUR, PCPSCRSER, THCSCRSER, THCU, CANNABQUANT, OMilo OBeaver [place]sworth EViroquaList of all UDS test(s) done:  Lab Results  Component Value Date   SUMMARY Note 06/20/2020   Last UDS on record: Summary  Date Value Ref Range Status  06/20/2020 Note  Final    Comment:    ==================================================================== Compliance Drug Analysis, Ur ==================================================================== Test                             Result       Flag       Units  Drug Present and Declared for Prescription Verification   Hydrocodone                    2260         EXPECTED   ng/mg creat   Norhydrocodone                 2128         EXPECTED   ng/mg creat    Sources of hydrocodone include scheduled prescription medications.  Norhydrocodone is an expected metabolite of hydrocodone.    Gabapentin                     PRESENT      EXPECTED   Lamotrigine                    PRESENT      EXPECTED   Citalopram                     PRESENT      EXPECTED   Desmethylcitalopram            PRESENT      EXPECTED    Desmethylcitalopram is an expected metabolite of citalopram or the    enantiomeric form, escitalopram.    Aripiprazole                   PRESENT      EXPECTED   Acetaminophen                  PRESENT      EXPECTED  Drug Present not Declared for Prescription Verification    Naproxen                       PRESENT      UNEXPECTED  Drug Absent but Declared for Prescription Verification   Mirtazapine                    Not Detected UNEXPECTED   Propranolol                    Not Detected UNEXPECTED ==================================================================== Test                      Result    Flag   Units      Ref Range   Creatinine              25               mg/dL      >=20 ==================================================================== Declared Medications:  The flagging and interpretation on this report are based on the  following declared medications.  Unexpected results may arise from  inaccuracies in the declared medications.   **Note: The testing scope of this panel includes these medications:   Aripiprazole (Abilify)  Citalopram (Celexa)  Gabapentin  Hydrocodone (Norco)  Lamotrigine (Lamictal)  Mirtazapine (Remeron)  Propranolol   **Note: The testing scope of this panel does not include small to  moderate amounts of these reported medications:   Acetaminophen (Tylenol)  Acetaminophen (Norco)   **Note: The testing scope of this panel does not include the  following reported medications:   Alendronate (Fosamax)  Azithromycin  Cetirizine (Zyrtec)  Clindamycin (Cleocin)  Dulaglutide (Trulicity)  Ertugliflozin (Steglatro)  Fluconazole (Diflucan)  Furosemide (Lasix)  Glimepiride  Hydrochlorothiazide  Insulin (Lantus)  Losartan (Cozaar)  Meclizine (Antivert)  Meloxicam (Mobic)  Metformin  Pantoprazole (Protonix)  Spironolactone  Vitamin B  Vitamin C ==================================================================== For clinical consultation, please call [phone]. ====================================================================    UDS interpretation: No unexpected findings.          Medication Assessment Form: Patient introduced to form today Treatment compliance: Treatment may start today if patient  agrees with proposed plan. Evaluation of compliance is not applicable at this point Risk Assessment Profile: Aberrant behavior: See initial evaluations. None observed or detected today Comorbid factors increasing  risk of overdose: See initial evaluation. No additional risks detected today Opioid risk tool (ORT):  Opioid Risk  07/30/2020  Alcohol 0  Illegal Drugs 0  Rx Drugs 0  Alcohol 0  Illegal Drugs 0  Rx Drugs 0  Age between 16-45 years  0  History of Preadolescent Sexual Abuse 0  Psychological Disease 2  Bipolar Positive  Opioid Risk Tool Scoring 2  Opioid Risk Interpretation Low Risk    ORT Scoring interpretation table:  Score <3 = Low Risk for SUD  Score between 4-7 = Moderate Risk for SUD  Score >8 = High Risk for Opioid Abuse   Risk of substance use disorder (SUD): Low  Risk Mitigation Strategies:  Patient opioid safety counseling: Completed today. Counseling provided to patient as per "Patient Counseling Document". Document signed by patient, attesting to counseling and understanding Patient-Prescriber Agreement (PPA): Obtained today.  Controlled substance notification to other providers: Written and sent today.  Pharmacologic Plan: Today we may be taking over the patient's pharmacological regimen. See below.             Laboratory Chemistry Profile   Renal Lab Results  Component Value Date   BUN 11 12/06/2019   CREATININE 0.59 12/06/2019   BCR 19 12/06/2019   GFRAA 114 12/06/2019   GFRNONAA 99 12/06/2019   PROTEINUR NEGATIVE 11/13/2019     Electrolytes Lab Results  Component Value Date   NA 137 12/06/2019   K 3.9 12/06/2019   CL 101 12/06/2019   CALCIUM 9.7 12/06/2019     Hepatic Lab Results  Component Value Date   AST 48 (H) 12/06/2019   ALT 47 (H) 12/06/2019   ALBUMIN 3.7 (L) 12/06/2019   ALKPHOS 122 (H) 12/06/2019     ID Lab Results  Component Value Date   HIV Non Reactive 12/06/2019   SARSCOV2NAA NEGATIVE 06/21/2019     Bone No  results found for: VD25OH, ZO109UE4VWU, JW1191YN8, GN5621HY8, 25OHVITD1, 25OHVITD2, 25OHVITD3, TESTOFREE, TESTOSTERONE   Endocrine Lab Results  Component Value Date   GLUCOSE 211 (H) 12/06/2019   GLUCOSEU NEGATIVE 11/13/2019   HGBA1C 7.3 (H) 06/22/2019   TSH 1.737 11/16/2019     Neuropathy Lab Results  Component Value Date   VITAMINB12 406 06/22/2019   FOLATE 18.4 06/22/2019   HGBA1C 7.3 (H) 06/22/2019   HIV Non Reactive 12/06/2019     CNS No results found for: COLORCSF, APPEARCSF, RBCCOUNTCSF, WBCCSF, POLYSCSF, LYMPHSCSF, EOSCSF, PROTEINCSF, GLUCCSF, JCVIRUS, CSFOLI, IGGCSF, LABACHR, ACETBL, LABACHR, ACETBL   Inflammation (CRP: Acute  ESR: Chronic) No results found for: CRP, ESRSEDRATE, LATICACIDVEN   Rheumatology Lab Results  Component Value Date   ANA Negative 12/06/2019     Coagulation Lab Results  Component Value Date   INR 1.2 05/06/2020   LABPROT 14.8 05/06/2020   APTT 30 06/21/2019   PLT 117 (L) 11/13/2019     Cardiovascular Lab Results  Component Value Date   CKTOTAL 66 12/06/2019   HGB 13.0 11/13/2019   HCT 40.6 11/13/2019     Screening Lab Results  Component Value Date   SARSCOV2NAA NEGATIVE 06/21/2019   HIV Non Reactive 12/06/2019     Cancer No results found for: CEA, CA125, LABCA2   Allergens No results found for: ALMOND, APPLE, ASPARAGUS, AVOCADO, BANANA, BARLEY, BASIL, BAYLEAF, GREENBEAN, LIMABEAN, WHITEBEAN, BEEFIGE, REDBEET, BLUEBERRY, BROCCOLI, CABBAGE, MELON, CARROT, CASEIN, CASHEWNUT, CAULIFLOWER, CELERY     Note: Lab results reviewed.  Recent Diagnostic Imaging Review  Ankle Imaging: Ankle-R DG Complete: Results for orders placed  during the hospital encounter of 12/03/18  DG Ankle Complete Right  Narrative CLINICAL DATA:  Right ankle pain after fall yesterday.  EXAM: RIGHT ANKLE - COMPLETE 3+ VIEW  COMPARISON:  None.  FINDINGS: There is no evidence of fracture, dislocation, or joint effusion. There is no evidence of  arthropathy or other focal bone abnormality. Soft tissues are unremarkable.  IMPRESSION: Negative.   Electronically Signed By: Marijo Conception, M.D. On: 12/03/2018 13:13    Complexity Note: Imaging results reviewed. Results shared with Ms. Lucia, using Layman's terms.                         Meds   Current Outpatient Medications:  .  acetaminophen (TYLENOL) 500 MG tablet, Take 500 mg by mouth every 6 (six) hours as needed. , Disp: , Rfl:  .  alendronate (FOSAMAX) 35 MG tablet, Take 35 mg by mouth every 7 (seven) days. Take with a full glass of water on an empty stomach., Disp: , Rfl:  .  amLODipine (NORVASC) 10 MG tablet, Take 10 mg by mouth daily., Disp: , Rfl:  .  ARIPiprazole (ABILIFY) 15 MG tablet, Take 1 tablet (15 mg total) by mouth daily., Disp: 30 tablet, Rfl: 0 .  azithromycin (ZITHROMAX) 250 MG tablet, TAKE AS DIRECTED PEROPACKAGE INSTRUCTIONS., Disp: , Rfl:  .  B Complex Vitamins (VITAMIN B-COMPLEX) TABS, Take 1 tablet by mouth daily. , Disp: , Rfl:  .  B Complex-C (B-COMPLEX WITH VITAMIN C) tablet, Take 1 tablet by mouth daily., Disp: 30 tablet, Rfl: 2 .  bisacodyl (DULCOLAX) 5 MG EC tablet, Take 5 mg by mouth daily as needed for moderate constipation., Disp: , Rfl:  .  bisoprolol-hydrochlorothiazide (ZIAC) 5-6.25 MG tablet, Take 1 tablet by mouth daily., Disp: , Rfl:  .  cetirizine (ZYRTEC) 10 MG tablet, Take 10 mg by mouth daily., Disp: , Rfl:  .  clindamycin (CLEOCIN) 150 MG capsule, TAKE 4 CAPSULES 1 HOURABEFORE DENTAL APPOINTMENT., Disp: , Rfl:  .  cloNIDine (CATAPRES) 0.1 MG tablet, clonidine HCl 0.1 mg tablet  TAKE 1 TABLET BY MOUTH 2 TIMES A DAY, Disp: , Rfl:  .  Dulaglutide (TRULICITY) 0.96 GE/3.6OQ SOPN, Inject 0.75 mg into the skin once a week. Thursday, Disp: , Rfl:  .  Ertugliflozin L-PyroglutamicAc (STEGLATRO) 15 MG TABS, Take by mouth. , Disp: , Rfl:  .  fluconazole (DIFLUCAN) 100 MG tablet, Take 100 mg by mouth daily. , Disp: , Rfl:  .  furosemide  (LASIX) 20 MG tablet, Take 10 mg by mouth daily as needed., Disp: , Rfl:  .  gabapentin (NEURONTIN) 600 MG tablet, TAKE (1) TABLET BY MOUTH (3) TIMES DAILY., Disp: 270 tablet, Rfl: 0 .  hydrochlorothiazide (HYDRODIURIL) 25 MG tablet, Take 25 mg by mouth daily. , Disp: , Rfl:  .  [START ON 08/11/2020] HYDROcodone-acetaminophen (NORCO) 10-325 MG tablet, Take 1 tablet by mouth 3 (three) times daily. For chronic pain syndrome. To last for 30 days from fill date., Disp: 90 tablet, Rfl: 0 .  [START ON 09/10/2020] HYDROcodone-acetaminophen (NORCO) 10-325 MG tablet, Take 1 tablet by mouth 3 (three) times daily. For chronic pain syndrome. To last for 30 days from fill date., Disp: 90 tablet, Rfl: 0 .  Insulin Glargine (LANTUS SOLOSTAR) 100 UNIT/ML Solostar Pen, Inject 60 Units into the skin at bedtime. , Disp: , Rfl:  .  lamoTRIgine (LAMICTAL) 200 MG tablet, TAKE ONE TABLET BY MOUTH ONCE DAILY., Disp: 90 tablet, Rfl: 0 .  lamoTRIgine (LAMICTAL) 25 MG tablet, Take 1 tablet (25 mg total) by mouth daily. To be combined with 200 mg daily, Disp: 30 tablet, Rfl: 1 .  LANTUS SOLOSTAR 100 UNIT/ML Solostar Pen, SMARTSIG:60 Unit(s) SUB-Q Every Night, Disp: , Rfl:  .  losartan (COZAAR) 25 MG tablet, Take 25 mg by mouth daily. Takes differently, Disp: , Rfl:  .  meclizine (ANTIVERT) 25 MG tablet, Take 25 mg by mouth 2 (two) times daily as needed., Disp: , Rfl:  .  meloxicam (MOBIC) 7.5 MG tablet, TAKE 1 TABLET BY MOUTH EVERY DAY, Disp: , Rfl:  .  metFORMIN (GLUCOPHAGE) 500 MG tablet, Take 500 mg by mouth 2 (two) times daily with a meal., Disp: , Rfl:  .  pantoprazole (PROTONIX) 40 MG tablet, Take 40 mg by mouth daily., Disp: , Rfl:  .  spironolactone (ALDACTONE) 25 MG tablet, Take 25 mg by mouth daily., Disp: , Rfl:  .  ferrous sulfate 325 (65 FE) MG tablet, Take 1 tablet (325 mg total) by mouth 2 (two) times daily with a meal., Disp: 120 tablet, Rfl: 0 .  glimepiride (AMARYL) 2 MG tablet, Take 2 mg by mouth daily with  breakfast. , Disp: , Rfl:   ROS  Constitutional: Denies any fever or chills Gastrointestinal: No reported hemesis, hematochezia, vomiting, or acute GI distress Musculoskeletal: Denies any acute onset joint swelling, redness, loss of ROM, or weakness Neurological: No reported episodes of acute onset apraxia, aphasia, dysarthria, agnosia, amnesia, paralysis, loss of coordination, or loss of consciousness  Allergies  Ms. Tat is allergic to lithium, penicillins, and sulfa antibiotics.  PFSH  Drug: Ms. Rotenberg  reports previous drug use. Alcohol:  reports previous alcohol use. Tobacco:  reports that she quit smoking about 9 years ago. Her smoking use included cigarettes. She has never used smokeless tobacco. Medical:  has a past medical history of Bipolar 1 disorder ([place]), Diabetes mellitus without complication ([place]), and Hypertension. Surgical: Ms. Killman  has a past surgical history that includes Abdominal hysterectomy; Tonsillectomy; Cholecystectomy; Esophagogastroduodenoscopy (egd) with propofol (N/A, 06/23/2019); and Colonoscopy with propofol (N/A, 06/23/2019). Family: family history includes Bipolar disorder in her brother, paternal uncle, and paternal uncle; Mental illness in her father.  Constitutional Exam  General appearance: Well nourished, well developed, and well hydrated. In no apparent acute distress Vitals:   07/30/20 0946 07/30/20 0948  BP:  (!) 108/58  Pulse:  (!) 58  Resp: 18   Temp: (!) 97.1 F (36.2 C) (!) 97.1 F (36.2 C)  SpO2:  98%  Weight: 200 lb (90.7 kg)   Height: _0  (1.549 m)    BMI Assessment: Estimated body mass index is 37.79 kg/m as calculated from the following:   Height as of this encounter: _1  (1.549 m).   Weight as of this encounter: 200 lb (90.7 kg).  BMI interpretation table: BMI level Category Range association with higher incidence of chronic pain  <18 kg/m2 Underweight   18.5-24.9 kg/m2 Ideal body weight   25-29.9 kg/m2 Overweight  Increased incidence by 20%  30-34.9 kg/m2 Obese (Class I) Increased incidence by 68%  35-39.9 kg/m2 Severe obesity (Class II) Increased incidence by 136%  >40 kg/m2 Extreme obesity (Class III) Increased incidence by 254%   Patient's current BMI Ideal Body weight  Body mass index is 37.79 kg/m. Ideal body weight: 47.8 kg (105 lb 6.1 oz) Adjusted ideal body weight: 65 kg (143 lb 3.6 oz)   BMI Readings from Last 4 Encounters:  07/30/20 37.79 kg/m  06/20/20 37.60 kg/m  12/06/19 35.50 kg/m  11/16/19 35.80 kg/m   Wt Readings from Last 4 Encounters:  07/30/20 200 lb (90.7 kg)  06/20/20 199 lb (90.3 kg)  12/06/19 191 lb (86.6 kg)  11/16/19 192 lb 9.6 oz (87.4 kg)    Psych/Mental status: Alert, oriented x 3 (person, place, & time)       Eyes: PERLA Respiratory: No evidence of acute respiratory distress          Thoracic Spine Area Exam  Skin & Axial Inspection: No masses, redness, or swelling Alignment: Symmetrical Functional ROM: Unrestricted ROM Stability: No instability detected Muscle Tone/Strength: Functionally intact. No obvious neuro-muscular anomalies detected. Sensory (Neurological): Unimpaired Muscle strength & Tone: No palpable anomalies  Lumbar Exam  Skin & Axial Inspection: No masses, redness, or swelling Alignment: Symmetrical Functional ROM: Pain restricted ROM affecting both sides Stability: No instability detected Muscle Tone/Strength: Functionally intact. No obvious neuro-muscular anomalies detected. Sensory (Neurological): Musculoskeletal pain pattern and dermatomal pain pattern at left L4-L5  Provocative Tests: Hyperextension/rotation test: (+) bilaterally for facet joint pain. Lumbar quadrant test (Kemp's test): (+) bilaterally for facet joint pain. Lateral bending test: (+) due to pain.,  Positive straight leg raise test Patrick's Maneuver: deferred today                   FABER* test: deferred today                   S-I anterior  distraction/compression test: deferred today         S-I lateral compression test: deferred today         S-I Thigh-thrust test: deferred today         S-I Gaenslen's test: deferred today         *(Flexion, ABduction and External Rotation)  Gait & Posture Assessment  Ambulation: Patient came in today in a wheel chair Gait: Limited. Using assistive device to ambulate Posture: Antalgic   Lower Extremity Exam    Side: Right lower extremity  Side: Left lower extremity  Stability: No instability observed          Stability: No instability observed          Skin & Extremity Inspection: Skin color, temperature, and hair growth are WNL. No peripheral edema or cyanosis. No masses, redness, swelling, asymmetry, or associated skin lesions. No contractures.  Skin & Extremity Inspection: Skin color, temperature, and hair growth are WNL. No peripheral edema or cyanosis. No masses, redness, swelling, asymmetry, or associated skin lesions. No contractures.  Functional ROM: Pain restricted ROM for knee joint          Functional ROM: Pain restricted ROM for hip and knee joints, positive straight leg raise test          Muscle Tone/Strength: Functionally intact. No obvious neuro-muscular anomalies detected.  Muscle Tone/Strength: Functionally intact. No obvious neuro-muscular anomalies detected.  Sensory (Neurological): Arthropathic arthralgia        Sensory (Neurological): Arthropathic arthralgia   and neurogenic      DTR: Patellar: deferred today Achilles: deferred today Plantar: deferred today  DTR: Patellar: deferred today Achilles: deferred today Plantar: deferred today  Palpation: No palpable anomalies  Palpation: No palpable anomalies     Assessment & Plan  Primary Diagnosis & Pertinent Problem List: The primary encounter diagnosis was Chronic pain syndrome. Diagnoses of Radicular pain of left lower extremity, Encounter for long-term opiate analgesic use, Pain management contract  signed, Lumbar facet arthropathy, and Bilateral  primary osteoarthritis of knee were also pertinent to this visit.  Visit Diagnosis: 1. Chronic pain syndrome   2. Radicular pain of left lower extremity   3. Encounter for long-term opiate analgesic use   4. Pain management contract signed   5. Lumbar facet arthropathy   6. Bilateral primary osteoarthritis of knee    Problems updated and reviewed during this visit: Problem  Radicular Pain of Left Lower Extremity  Pain Management Contract Signed  Lumbar Facet Arthropathy    Plan of Care  Pharmacotherapy (Medications Ordered): Meds ordered this encounter  Medications  . HYDROcodone-acetaminophen (NORCO) 10-325 MG tablet    Sig: Take 1 tablet by mouth 3 (three) times daily. For chronic pain syndrome. To last for 30 days from fill date.    Dispense:  90 tablet    Refill:  0    Ok to fill 1 day early if pharmacy closed on fill date  . HYDROcodone-acetaminophen (NORCO) 10-325 MG tablet    Sig: Take 1 tablet by mouth 3 (three) times daily. For chronic pain syndrome. To last for 30 days from fill date.    Dispense:  90 tablet    Refill:  0    Ok to fill 1 day early if pharmacy closed on fill date    Procedure Orders     KNEE INJECTION  Pharmacological management options:  Opioid Analgesics: We'll take over management today. See above orders Membrane stabilizer: Currently on a membrane stabilizer gabapentin 600 mg 3 times daily Muscle relaxant: We have discussed the possibility of a trial NSAID: Currently on a NSAID Other analgesic(s): To be determined at a later time    Interventional management options: Planned, scheduled, and/or pending:    Intra-articular knee Hyalgan series   Considering:   Lumbar epidural steroid injection, lumbar facet medial branch nerve block pending lumbar MRI   PRN Procedures:   None at this time    Provider-requested follow-up: Return in about 2 weeks (around 08/13/2020) for B/L knee hyalgan  #1. Recent Visits Date Type Provider Dept  06/20/20 Office Visit Gillis Santa, MD [facility]  Showing recent visits within past 90 days and meeting all other requirements Today's Visits Date Type Provider Dept  07/30/20 Office Visit Gillis Santa, MD [facility]  Showing today's visits and meeting all other requirements Future Appointments Date Type Provider Dept  09/18/20 Appointment Gillis Santa, MD [facility]  Showing future appointments within next 90 days and meeting all other requirements  Primary Care Physician: Rusty Aus, MD Note by: Gillis Santa, MD Date: 07/30/2020; Time: 10:31 AM

## 2020-08-08 ENCOUNTER — Telehealth: Payer: Medicaid Other | Admitting: Psychiatry

## 2020-08-08 ENCOUNTER — Other Ambulatory Visit: Payer: Self-pay

## 2020-08-08 ENCOUNTER — Encounter: Payer: Self-pay | Admitting: Psychiatry

## 2020-08-08 ENCOUNTER — Telehealth (INDEPENDENT_AMBULATORY_CARE_PROVIDER_SITE_OTHER): Payer: Medicaid Other | Admitting: Psychiatry

## 2020-08-08 DIAGNOSIS — Z634 Disappearance and death of family member: Secondary | ICD-10-CM | POA: Diagnosis not present

## 2020-08-08 DIAGNOSIS — F5105 Insomnia due to other mental disorder: Secondary | ICD-10-CM

## 2020-08-08 DIAGNOSIS — F3174 Bipolar disorder, in full remission, most recent episode manic: Secondary | ICD-10-CM

## 2020-08-08 MED ORDER — ARIPIPRAZOLE 15 MG PO TABS: 7.5000 mg | ORAL_TABLET | Freq: Every day | ORAL | 0 refills | Status: DC

## 2020-08-08 MED ORDER — GABAPENTIN 600 MG PO TABS: ORAL_TABLET | ORAL | 0 refills | Status: DC

## 2020-08-08 NOTE — Progress Notes (Signed)
Provider Location : [facility] Patient Location : Home  Participants: Patient , Provider  Virtual Visit via Video Note  I connected with Margaret [name] on 08/08/20 at 10:20 AM EDT by a video enabled telemedicine application and verified that I am speaking with the correct person using two identifiers.   I discussed the limitations of evaluation and management by telemedicine and the availability of in person appointments. The patient expressed understanding and agreed to proceed.    I discussed the assessment and treatment plan with the patient. The patient was provided an opportunity to ask questions and all were answered. The patient agreed with the plan and demonstrated an understanding of the instructions.   The patient was advised to call back or seek an in-person evaluation if the symptoms worsen or if the condition fails to improve as anticipated.  Elk Horn MD OP Progress Note  08/08/2020 12:33 PM Margaret [name]  MRN:  [id number]  Chief Complaint:  Chief Complaint    Follow-up     HPI: Margaret [name] is a 64 year old Caucasian female on disability, divorced, lives in [place], has a history of bipolar disorder, hyperlipidemia, insomnia, diabetes melitis, chronic pain, hypertension was evaluated by telemedicine today.  Patient today reports she is currently relocating to a new place.  She reports that she just wanted to get her own place and that is why she decided to go.  She continues to have support system from a friend who lives close by.  She reports she continues to get emotional at times and certain situations can make her cry.  She however reports that does not last too long and usually she is able to feel better within a few minutes.  She does not feel overtly depressed or anxious at this time.  She reports she went back to taking the Abilify half tablet of 7.5 mg daily since she felt she did not need a higher dosage.  She currently denies any tremors from the Abilify.  She is  compliant on the higher dosage of Lamictal as discussed last visit.  She reports sleep as better.  She currently sleeps 6 to 7 hours at night.  Patient denies any suicidality, homicidality or perceptual disturbances.  Visit Diagnosis:    ICD-10-CM   1. Bipolar disorder, in full remission, most recent episode manic (HCC)  F31.74 ARIPiprazole (ABILIFY) 15 MG tablet  2. Bereavement  Z63.4 gabapentin (NEURONTIN) 600 MG tablet  3. Insomnia due to mental condition  F51.05     Past Psychiatric History: I have reviewed past psychiatric history from my progress note on 08/21/2019.  Past trials of Depakote, lithium, risperidone, Xanax  Past Medical History:  Past Medical History:  Diagnosis Date  . Bipolar 1 disorder ([place])   . Diabetes mellitus without complication (HCC)    Type 2  . Hypertension     Past Surgical History:  Procedure Laterality Date  . ABDOMINAL HYSTERECTOMY    . CHOLECYSTECTOMY    . COLONOSCOPY WITH PROPOFOL N/A 06/23/2019   Procedure: COLONOSCOPY WITH PROPOFOL;  Surgeon: Jonathon Bellows, MD;  Location: [facility] ENDOSCOPY;  Service: Gastroenterology;  Laterality: N/A;  . ESOPHAGOGASTRODUODENOSCOPY (EGD) WITH PROPOFOL N/A 06/23/2019   Procedure: ESOPHAGOGASTRODUODENOSCOPY (EGD) WITH PROPOFOL;  Surgeon: Jonathon Bellows, MD;  Location: [facility] ENDOSCOPY;  Service: Gastroenterology;  Laterality: N/A;  . TONSILLECTOMY      Family Psychiatric History: I have reviewed family psychiatric history from my progress note on 08/21/2019  Family History:  Family History  Problem Relation Age of Onset  .  Mental illness Father   . Bipolar disorder Brother   . Bipolar disorder Paternal Uncle   . Bipolar disorder Paternal Uncle     Social History: Reviewed social history from my progress note on 08/21/2019 Social History   Socioeconomic History  . Marital status: Divorced    Spouse name: Not on file  . Number of children: 2  . Years of education: Not on file  . Highest education level: Not  on file  Occupational History  . Not on file  Tobacco Use  . Smoking status: Former Smoker    Types: Cigarettes    Quit date: 03/03/2011    Years since quitting: 9.4  . Smokeless tobacco: Never Used  Vaping Use  . Vaping Use: Never used  Substance and Sexual Activity  . Alcohol use: Not Currently  . Drug use: Not Currently  . Sexual activity: Not on file  Other Topics Concern  . Not on file  Social History Narrative  . Not on file   Social Determinants of Health   Financial Resource Strain:   . Difficulty of Paying Living Expenses: Not on file  Food Insecurity:   . Worried About Charity fundraiser in the Last Year: Not on file  . Ran Out of Food in the Last Year: Not on file  Transportation Needs:   . Lack of Transportation (Medical): Not on file  . Lack of Transportation (Non-Medical): Not on file  Physical Activity:   . Days of Exercise per Week: Not on file  . Minutes of Exercise per Session: Not on file  Stress:   . Feeling of Stress : Not on file  Social Connections:   . Frequency of Communication with Friends and Family: Not on file  . Frequency of Social Gatherings with Friends and Family: Not on file  . Attends Religious Services: Not on file  . Active Member of Clubs or Organizations: Not on file  . Attends Archivist Meetings: Not on file  . Marital Status: Not on file    Allergies:  Allergies  Allergen Reactions  . Lithium Nausea And Vomiting  . Penicillins Hives    Resulted in hospitalization  . Sulfa Antibiotics Hives    Resulted in hospitalization    Metabolic Disorder Labs: Lab Results  Component Value Date   HGBA1C 7.3 (H) 06/22/2019   MPG 162.81 06/22/2019   No results found for: PROLACTIN No results found for: CHOL, TRIG, HDL, CHOLHDL, VLDL, LDLCALC Lab Results  Component Value Date   TSH 1.737 11/16/2019    Therapeutic Level Labs: No results found for: LITHIUM No results found for: VALPROATE No components found for:   CBMZ  Current Medications: Current Outpatient Medications  Medication Sig Dispense Refill  . acetaminophen (TYLENOL) 500 MG tablet Take 500 mg by mouth every 6 (six) hours as needed.     Marland Kitchen alendronate (FOSAMAX) 35 MG tablet Take 35 mg by mouth every 7 (seven) days. Take with a full glass of water on an empty stomach.    Marland Kitchen amLODipine (NORVASC) 10 MG tablet Take 10 mg by mouth daily.    . ARIPiprazole (ABILIFY) 15 MG tablet Take 0.5 tablets (7.5 mg total) by mouth daily. 30 tablet 0  . azithromycin (ZITHROMAX) 250 MG tablet TAKE AS DIRECTED PEROPACKAGE INSTRUCTIONS.    . B Complex Vitamins (VITAMIN B-COMPLEX) TABS Take 1 tablet by mouth daily.     . B Complex-C (B-COMPLEX WITH VITAMIN C) tablet Take 1 tablet by mouth daily.  30 tablet 2  . bisacodyl (DULCOLAX) 5 MG EC tablet Take 5 mg by mouth daily as needed for moderate constipation.    . bisoprolol-hydrochlorothiazide (ZIAC) 5-6.25 MG tablet Take 1 tablet by mouth daily.    . cetirizine (ZYRTEC) 10 MG tablet Take 10 mg by mouth daily.    . clindamycin (CLEOCIN) 150 MG capsule TAKE 4 CAPSULES 1 HOURABEFORE DENTAL APPOINTMENT.    . cloNIDine (CATAPRES) 0.1 MG tablet clonidine HCl 0.1 mg tablet  TAKE 1 TABLET BY MOUTH 2 TIMES A DAY    . Dulaglutide (TRULICITY) 8.67 YP/9.5KD SOPN Inject 0.75 mg into the skin once a week. Thursday    . Ertugliflozin L-PyroglutamicAc (STEGLATRO) 15 MG TABS Take by mouth.     . ferrous sulfate 325 (65 FE) MG tablet Take 1 tablet (325 mg total) by mouth 2 (two) times daily with a meal. 120 tablet 0  . fluconazole (DIFLUCAN) 100 MG tablet Take 100 mg by mouth daily.     . furosemide (LASIX) 20 MG tablet Take 10 mg by mouth daily as needed.    . gabapentin (NEURONTIN) 600 MG tablet TAKE (1) TABLET BY MOUTH (3) TIMES DAILY. 270 tablet 0  . glimepiride (AMARYL) 2 MG tablet Take 2 mg by mouth daily with breakfast.     . hydrochlorothiazide (HYDRODIURIL) 25 MG tablet Take 25 mg by mouth daily.     Derrill Memo ON 08/11/2020]  HYDROcodone-acetaminophen (NORCO) 10-325 MG tablet Take 1 tablet by mouth 3 (three) times daily. For chronic pain syndrome. To last for 30 days from fill date. 90 tablet 0  . [START ON 09/10/2020] HYDROcodone-acetaminophen (NORCO) 10-325 MG tablet Take 1 tablet by mouth 3 (three) times daily. For chronic pain syndrome. To last for 30 days from fill date. 90 tablet 0  . Insulin Glargine (LANTUS SOLOSTAR) 100 UNIT/ML Solostar Pen Inject 60 Units into the skin at bedtime.     . lamoTRIgine (LAMICTAL) 200 MG tablet TAKE ONE TABLET BY MOUTH ONCE DAILY. 90 tablet 0  . lamoTRIgine (LAMICTAL) 25 MG tablet Take 1 tablet (25 mg total) by mouth daily. To be combined with 200 mg daily 30 tablet 1  . LANTUS SOLOSTAR 100 UNIT/ML Solostar Pen SMARTSIG:60 Unit(s) SUB-Q Every Night    . losartan (COZAAR) 25 MG tablet Take 25 mg by mouth daily. Takes differently    . meclizine (ANTIVERT) 25 MG tablet Take 25 mg by mouth 2 (two) times daily as needed.    . meloxicam (MOBIC) 7.5 MG tablet TAKE 1 TABLET BY MOUTH EVERY DAY    . metFORMIN (GLUCOPHAGE) 500 MG tablet Take 500 mg by mouth 2 (two) times daily with a meal.    . pantoprazole (PROTONIX) 40 MG tablet Take 40 mg by mouth daily.    Marland Kitchen spironolactone (ALDACTONE) 25 MG tablet Take 25 mg by mouth daily.     No current facility-administered medications for this visit.     Musculoskeletal: Strength & Muscle Tone: UTA Gait & Station: normal Patient leans: N/A  Psychiatric Specialty Exam: Review of Systems  Psychiatric/Behavioral: Negative for agitation, behavioral problems, confusion, decreased concentration, dysphoric mood, hallucinations, self-injury, sleep disturbance and suicidal ideas. The patient is not nervous/anxious and is not hyperactive.   All other systems reviewed and are negative.   There were no vitals taken for this visit.There is no height or weight on file to calculate BMI.  General Appearance: Casual  Eye Contact:  Fair  Speech:  Clear and  Coherent  Volume:  Normal  Mood:  Euthymic does report crying spells which are brief and situational  Affect:  Congruent  Thought Process:  Goal Directed and Descriptions of Associations: Intact  Orientation:  Full (Time, Place, and Person)  Thought Content: Logical   Suicidal Thoughts:  No  Homicidal Thoughts:  No  Memory:  Immediate;   Fair Recent;   Fair Remote;   Fair  Judgement:  Fair  Insight:  Fair  Psychomotor Activity:  Normal  Concentration:  Concentration: Fair and Attention Span: Fair  Recall:  [employer] of Knowledge: Fair  Language: Fair  Akathisia:  No  Handed:  Right  AIMS (if indicated): UTA  Assets:  Communication Skills Desire for Improvement Housing Social Support  ADL's:  Intact  Cognition: WNL  Sleep:  Fair   Screenings:   Assessment and Plan: Nashley Cordoba is a 64 year old Caucasian female on disability, divorced, lives in [place], has a history of bipolar disorder, chronic pain, diabetes melitis, hypertension, hyperlipidemia was evaluated by telemedicine today.  Patient is biologically predisposed given her family history, history of trauma.  Patient with psychosocial stressors of her mother's death, current pandemic and her current relocation.  Patient however is currently stable on current medication regimen.  Plan as noted below.  Plan  Bipolar disorder type I manic in remission Lamotrigine 225 mg p.o. daily Abilify 7.5 mg p.o. daily. Gabapentin 600 mg p.o. 3 times daily   Insomnia likely due to mental health problems-stable She is sleeping better.  Bereavement-improving We will monitor closely  Follow-up in clinic in 1 month or sooner if needed.  I have spent atleast 20 minutes face to face by video with patient today. More than 50 % of the time was spent for preparing to see the patient ( e.g., review of test, records ), ordering medications and test ,psychoeducation and supportive psychotherapy and care coordination,as well as  documenting clinical information in electronic health record. This note was generated in part or whole with voice recognition software. Voice recognition is usually quite accurate but there are transcription errors that can and very often do occur. I apologize for any typographical errors that were not detected and corrected.     Ursula Alert, MD 08/08/2020, 12:33 PM

## 2020-08-28 ENCOUNTER — Encounter: Payer: Self-pay | Admitting: Student in an Organized Health Care Education/Training Program

## 2020-08-28 ENCOUNTER — Ambulatory Visit
Payer: Medicaid Other | Attending: Student in an Organized Health Care Education/Training Program | Admitting: Student in an Organized Health Care Education/Training Program

## 2020-08-28 ENCOUNTER — Other Ambulatory Visit: Payer: Self-pay

## 2020-08-28 DIAGNOSIS — M170 Bilateral primary osteoarthritis of knee: Secondary | ICD-10-CM

## 2020-08-28 MED ORDER — SODIUM HYALURONATE (VISCOSUP) 20 MG/2ML IX SOSY
2.0000 mL | PREFILLED_SYRINGE | Freq: Once | INTRA_ARTICULAR | Status: AC
  Administered 2020-08-28: 2 mL via INTRA_ARTICULAR

## 2020-08-28 MED ORDER — LIDOCAINE HCL 2 % IJ SOLN
20.0000 mL | Freq: Once | INTRAMUSCULAR | Status: AC
  Administered 2020-08-28: 400 mg

## 2020-08-28 MED ORDER — LIDOCAINE HCL 2 % IJ SOLN
INTRAMUSCULAR | Status: AC
  Filled 2020-08-28: qty 20

## 2020-08-28 NOTE — Progress Notes (Signed)
Nursing Pain Medication Assessment:  Safety precautions to be maintained throughout the outpatient stay will include: orient to surroundings, keep bed in low position, maintain call bell within reach at all times, provide assistance with transfer out of bed and ambulation.  Medication Inspection Compliance: Ms. Kaufman did not comply with our request to bring her pills to be counted. She was reminded that bringing the medication bottles, even when empty, is a requirement.  Medication: None brought in. Pill/Patch Count: None available to be counted. Bottle Appearance: No container available. Did not bring bottle(s) to appointment. Filled Date: N/A Last Medication intake:  Today 

## 2020-08-28 NOTE — Progress Notes (Signed)
PROVIDER NOTE: Information contained herein reflects review and annotations entered in association with encounter. Interpretation of such information and data should be left to medically-trained personnel. Information provided to patient can be located elsewhere in the medical record under "Patient Instructions". Document created using STT-dictation technology, any transcriptional errors that may result from process are unintentional.    Patient: Margaret [name]  Service Category: Procedure  Provider: Gillis Santa, MD  DOB: January 11, 1956  DOS: 08/28/2020  Location: [facility]  MRN: [id number]  Setting: Ambulatory - outpatient  Referring Provider: Gillis Santa, MD  Type: Established Patient  Specialty: Interventional Pain Management  PCP: Rusty Aus, MD   Primary Reason for Visit: Interventional Pain Management Treatment. CC: Knee Pain (bilateral) and Hip Pain (left)  Procedure:          Anesthesia, Analgesia, Anxiolysis:  Type: Diagnostic Intra-Articular Hyalgan Knee Injection #1  Region: Medial infrapatellar Knee Region Level: Knee Joint Laterality: Bilateral  Type: Local Anesthesia Indication(s): Analgesia         Local Anesthetic: Lidocaine 1-2% Route: Infiltration (Taft/IM) IV Access: Declined Sedation: Declined   Position: Sitting   Indications: 1. Bilateral primary osteoarthritis of knee    Pain Score: Pre-procedure: 7 /10 Post-procedure: 7 /10   Pre-op Assessment:  Margaret [name] is a 64 y.o. (year old), female patient, seen today for interventional treatment. She  has a past surgical history that includes Abdominal hysterectomy; Tonsillectomy; Cholecystectomy; Esophagogastroduodenoscopy (egd) with propofol (N/A, 06/23/2019); and Colonoscopy with propofol (N/A, 06/23/2019). Margaret [name] has a current medication list which includes the following prescription(s): acetaminophen, alendronate, amlodipine, aripiprazole, b-complex with vitamin c, bisacodyl,  bisoprolol-hydrochlorothiazide, cetirizine, clindamycin, clonidine, trulicity, steglatro, ferrous sulfate, furosemide, gabapentin, glimepiride, hydrochlorothiazide, hydrocodone-acetaminophen, [START ON 09/10/2020] hydrocodone-acetaminophen, lantus solostar, lamotrigine, lamotrigine, lantus solostar, losartan, losartan, meclizine, meloxicam, metformin, pantoprazole, spironolactone, azithromycin, vitamin b-complex, and fluconazole. Her primarily concern today is the Knee Pain (bilateral) and Hip Pain (left)  Initial Vital Signs:  Pulse/HCG Rate: (!) 56  Temp: 98.8 F (37.1 C) Resp: 16 BP: (!) 143/56 SpO2: 99 %  BMI: Estimated body mass index is 39.06 kg/m as calculated from the following:   Height as of this encounter: 5' (1.524 m).   Weight as of this encounter: 200 lb (90.7 kg).  Risk Assessment: Allergies: Reviewed. She is allergic to lithium, penicillins, and sulfa antibiotics.  Allergy Precautions: None required Coagulopathies: Reviewed. None identified.  Blood-thinner therapy: None at this time Active Infection(s): Reviewed. None identified. Margaret [name] is afebrile  Site Confirmation: Margaret [name] was asked to confirm the procedure and laterality before marking the site Procedure checklist: Completed Consent: Before the procedure and under the influence of no sedative(s), amnesic(s), or anxiolytics, the patient was informed of the treatment options, risks and possible complications. To fulfill our ethical and legal obligations, as recommended by the American Medical Association's Code of Ethics, I have informed the patient of my clinical impression; the nature and purpose of the treatment or procedure; the risks, benefits, and possible complications of the intervention; the alternatives, including doing nothing; the risk(s) and benefit(s) of the alternative treatment(s) or procedure(s); and the risk(s) and benefit(s) of doing nothing. The patient was provided information about the general  risks and possible complications associated with the procedure. These may include, but are not limited to: failure to achieve desired goals, infection, bleeding, organ or nerve damage, allergic reactions, paralysis, and death. In addition, the patient was informed of those risks and complications associated to the procedure, such as failure to decrease pain;  infection; bleeding; organ or nerve damage with subsequent damage to sensory, motor, and/or autonomic systems, resulting in permanent pain, numbness, and/or weakness of one or several areas of the body; allergic reactions; (i.e.: anaphylactic reaction); and/or death. Furthermore, the patient was informed of those risks and complications associated with the medications. These include, but are not limited to: allergic reactions (i.e.: anaphylactic or anaphylactoid reaction(s)); adrenal axis suppression; blood sugar elevation that in diabetics may result in ketoacidosis or comma; water retention that in patients with history of congestive heart failure may result in shortness of breath, pulmonary edema, and decompensation with resultant heart failure; weight gain; swelling or edema; medication-induced neural toxicity; particulate matter embolism and blood vessel occlusion with resultant organ, and/or nervous system infarction; and/or aseptic necrosis of one or more joints. Finally, the patient was informed that Medicine is not an exact science; therefore, there is also the possibility of unforeseen or unpredictable risks and/or possible complications that may result in a catastrophic outcome. The patient indicated having understood very clearly. We have given the patient no guarantees and we have made no promises. Enough time was given to the patient to ask questions, all of which were answered to the patient's satisfaction. Margaret [name] has indicated that she wanted to continue with the procedure. Attestation: I, the ordering provider, attest that I have  discussed with the patient the benefits, risks, side-effects, alternatives, likelihood of achieving goals, and potential problems during recovery for the procedure that I have provided informed consent. Date  Time: 08/28/2020 10:54 AM  Pre-Procedure Preparation:  Monitoring: As per clinic protocol. Respiration, ETCO2, SpO2, BP, heart rate and rhythm monitor placed and checked for adequate function Safety Precautions: Patient was assessed for positional comfort and pressure points before starting the procedure. Time-out: I initiated and conducted the "Time-out" before starting the procedure, as per protocol. The patient was asked to participate by confirming the accuracy of the "Time Out" information. Verification of the correct person, site, and procedure were performed and confirmed by me, the nursing staff, and the patient. "Time-out" conducted as per Joint Commission's Universal Protocol (UP.01.01.01). Time: 1125  Description of Procedure:          Target Area: Knee Joint Approach: Just above the Medial tibial plateau, lateral to the infrapatellar tendon. Area Prepped: Entire knee area, from the mid-thigh to the mid-shin. DuraPrep (Iodine Povacrylex [0.7% available iodine] and Isopropyl Alcohol, 74% w/w) Safety Precautions: Aspiration looking for blood return was conducted prior to all injections. At no point did we inject any substances, as a needle was being advanced. No attempts were made at seeking any paresthesias. Safe injection practices and needle disposal techniques used. Medications properly checked for expiration dates. SDV (single dose vial) medications used. Description of the Procedure: Protocol guidelines were followed. The patient was placed in position over the fluoroscopy table. The target area was identified and the area prepped in the usual manner. Skin & deeper tissues infiltrated with local anesthetic. Appropriate amount of time allowed to pass for local anesthetics to take  effect. The procedure needles were then advanced to the target area. Proper needle placement secured. Negative aspiration confirmed. Solution injected in intermittent fashion, asking for systemic symptoms every 0.5cc of injectate. The needles were then removed and the area cleansed, making sure to leave some of the prepping solution back to take advantage of its long term bactericidal properties. Vitals:   08/28/20 1100  BP: (!) 143/56  Pulse: (!) 56  Resp: 16  Temp: 98.8 F (37.1 C)  SpO2: 99%  Weight: 200 lb (90.7 kg)  Height: 5' (1.524 m)    Start Time: 1125 hrs. End Time: 1130 hrs. Materials:  Needle(s) Type: Regular needle Gauge: 25G Length: 1.5-in Medication(s): Please see orders for medications and dosing details.  Imaging Guidance:          Type of Imaging Technique: None used Indication(s): N/A Exposure Time: No patient exposure Contrast: None used. Fluoroscopic Guidance: N/A Ultrasound Guidance: N/A Interpretation: N/A  Antibiotic Prophylaxis:   Anti-infectives (From admission, onward)   None     Indication(s): None identified  Post-operative Assessment:  Post-procedure Vital Signs:  Pulse/HCG Rate: (!) 56  Temp: 98.8 F (37.1 C) Resp: 16 BP: (!) 143/56 SpO2: 99 %  EBL: None  Complications: No immediate post-treatment complications observed by team, or reported by patient.  Note: The patient tolerated the entire procedure well. A repeat set of vitals were taken after the procedure and the patient was kept under observation following institutional policy, for this type of procedure. Post-procedural neurological assessment was performed, showing return to baseline, prior to discharge. The patient was provided with post-procedure discharge instructions, including a section on how to identify potential problems. Should any problems arise concerning this procedure, the patient was given instructions to immediately contact us, at any time, without hesitation. In  any case, we plan to contact the patient by telephone for a follow-up status report regarding this interventional procedure.  Comments:  No additional relevant information.  Plan of Care  Orders:  Orders Placed This Encounter  Procedures  . KNEE INJECTION    Hyalgan knee injection. Please order Hyalgan.    Standing Status:   Future    Standing Expiration Date:   09/28/2020    Scheduling Instructions:     Procedure: Intra-articular Hyalgan Knee injection   #2       Side: Bilateral     Sedation: None     Timeframe: in 4 weeks    Order Specific Question:   Where will this procedure be performed?    Answer:   [facility]   Chronic Opioid Analgesic:  Norco 10 mg TID prn   Medications ordered for procedure: Meds ordered this encounter  Medications  . Sodium Hyaluronate SOSY 2 mL  . Sodium Hyaluronate SOSY 2 mL  . lidocaine (XYLOCAINE) 2 % (with pres) injection 400 mg   Medications administered: We administered Sodium Hyaluronate, Sodium Hyaluronate, and lidocaine.  See the medical record for exact dosing, route, and time of administration.  Follow-up plan:   Return in about 4 weeks (around 09/25/2020) for B/L knee Hyalgan No. 2.       Interventional management options: Planned, scheduled, and/or pending:    Intra-articular knee Hyalgan series #1 done 08/28/20   Considering:   Lumbar epidural steroid injection, lumbar facet medial branch nerve block pending lumbar MRI   PRN Procedures:   None at this time     Recent Visits Date Type Provider Dept  07/30/20 Office Visit Gillis Santa, MD [facility]  06/20/20 Office Visit Gillis Santa, MD [facility]  Showing recent visits within past 90 days and meeting all other requirements Today's Visits Date Type Provider Dept  08/28/20 Procedure visit Gillis Santa, MD [facility]  Showing today's visits and meeting all other requirements Future Appointments Date Type Provider Dept   09/18/20 Appointment Gillis Santa, MD [facility]  Showing future appointments within next 90 days and meeting all other requirements  Disposition: Discharge home  Discharge (Date  Time): 08/28/2020; 1140 hrs.   Primary Care Physician: Rusty Aus, MD Location: [facility] Note by: Gillis Santa, MD Date: 08/28/2020; Time: 11:55 AM  Disclaimer:  Medicine is not an exact science. The only guarantee in medicine is that nothing is guaranteed. It is important to note that the decision to proceed with this intervention was based on the information collected from the patient. The Data and conclusions were drawn from the patient's questionnaire, the interview, and the physical examination. Because the information was provided in large part by the patient, it cannot be guaranteed that it has not been purposely or unconsciously manipulated. Every effort has been made to obtain as much relevant data as possible for this evaluation. It is important to note that the conclusions that lead to this procedure are derived in large part from the available data. Always take into account that the treatment will also be dependent on availability of resources and existing treatment guidelines, considered by other Pain Management Practitioners as being common knowledge and practice, at the time of the intervention. For Medico-Legal purposes, it is also important to point out that variation in procedural techniques and pharmacological choices are the acceptable norm. The indications, contraindications, technique, and results of the above procedure should only be interpreted and judged by a Board-Certified Interventional Pain Specialist with extensive familiarity and expertise in the same exact procedure and technique.

## 2020-08-28 NOTE — Patient Instructions (Signed)

## 2020-08-29 ENCOUNTER — Telehealth: Payer: Self-pay

## 2020-08-29 NOTE — Telephone Encounter (Signed)
Post procedure phone call.  Patient states she is doing well.  

## 2020-09-09 ENCOUNTER — Other Ambulatory Visit: Payer: Self-pay

## 2020-09-09 ENCOUNTER — Encounter: Payer: Self-pay | Admitting: Psychiatry

## 2020-09-09 ENCOUNTER — Telehealth (INDEPENDENT_AMBULATORY_CARE_PROVIDER_SITE_OTHER): Payer: Medicaid Other | Admitting: Psychiatry

## 2020-09-09 DIAGNOSIS — Z634 Disappearance and death of family member: Secondary | ICD-10-CM

## 2020-09-09 DIAGNOSIS — F5105 Insomnia due to other mental disorder: Secondary | ICD-10-CM | POA: Diagnosis not present

## 2020-09-09 DIAGNOSIS — F3131 Bipolar disorder, current episode depressed, mild: Secondary | ICD-10-CM | POA: Diagnosis not present

## 2020-09-09 MED ORDER — ARIPIPRAZOLE 15 MG PO TABS: 7.5000 mg | ORAL_TABLET | Freq: Every day | ORAL | 0 refills | Status: DC

## 2020-09-09 MED ORDER — LAMOTRIGINE 200 MG PO TABS: 200.0000 mg | ORAL_TABLET | Freq: Every day | ORAL | 0 refills | Status: DC

## 2020-09-09 MED ORDER — LAMOTRIGINE 25 MG PO TABS: 25.0000 mg | ORAL_TABLET | Freq: Two times a day (BID) | ORAL | 1 refills | Status: DC

## 2020-09-09 NOTE — Progress Notes (Signed)
Virtual Visit via Video Note  I connected with Margaret [name] on 09/09/20 at 10:20 AM EST by a video enabled telemedicine application and verified that I am speaking with the correct person using two identifiers.  Location Provider Location : [facility] Patient Location : Home  Participants: Patient , Provider    I discussed the limitations of evaluation and management by telemedicine and the availability of in person appointments. The patient expressed understanding and agreed to proceed.    I discussed the assessment and treatment plan with the patient. The patient was provided an opportunity to ask questions and all were answered. The patient agreed with the plan and demonstrated an understanding of the instructions.   The patient was advised to call back or seek an in-person evaluation if the symptoms worsen or if the condition fails to improve as anticipated.   Margaret [name] OP Progress Note  09/09/2020 2:00 PM Margaret [name]  MRN:  [id number]  Chief Complaint:  Chief Complaint    Follow-up     HPI: Margaret [name] is a 64 year old Caucasian female on disability, divorced, lives in [place], has a history of bipolar disorder, hyperlipidemia, insomnia, diabetes melitis, chronic pain, hypertension was evaluated by telemedicine today.  Patient today reports she is currently at her new place, recently moved.  She reports she enjoys the new community that she lives in.  She has already made a few friends.  Patient however reports she continues to have these crying spells and does not know why.  She denies feeling depressed.  She reports it can come out of nowhere and sometimes triggered by situations.  She reports her daughter moved to [place] and that does worry her.  She reports she will not get to see her daughter and grandchildren much anymore.  She however does have a son who lives in [place] and that does help.  Patient reports sleep is good.  Patient denies any suicidality, homicidality or  perceptual disturbances.  Patient reports she does struggle with pain however is currently with her pain provider in [place] and that does help.  She recently got injections for pain.  She is compliant on her psychotropic medications and denies side effects.  Patient denies any other concerns today.  Visit Diagnosis:    ICD-10-CM   1. Bipolar 1 disorder, depressed, mild (HCC)  F31.31 lamoTRIgine (LAMICTAL) 25 MG tablet    ARIPiprazole (ABILIFY) 15 MG tablet  2. Bereavement  Z63.4 lamoTRIgine (LAMICTAL) 200 MG tablet  3. Insomnia due to mental condition  F51.05     Past Psychiatric History: I have reviewed past psychiatric history from my progress note on 2019-08-21.  Past trials of Depakote, lithium, risperidone, Xanax.  Past Medical History:  Past Medical History:  Diagnosis Date  . Bipolar 1 disorder ([place])   . Diabetes mellitus without complication (HCC)    Type 2  . Hypertension     Past Surgical History:  Procedure Laterality Date  . ABDOMINAL HYSTERECTOMY    . CHOLECYSTECTOMY    . COLONOSCOPY WITH PROPOFOL N/A 06/23/2019   Procedure: COLONOSCOPY WITH PROPOFOL;  Surgeon: Jonathon Bellows, [name];  Location[facility]COPY;  Service: Gastroenterology;  Laterality: N/A;  . ESOPHAGOGASTRODUODENOSCOPY (EGD) WITH PROPOFOL N/A 06/23/2019   Procedure: ESOPHAGOGASTRODUODENOSCOPY (EGD) WITH PROPOFOL;  Surgeon: Jonathon Bellows, [name];  Location: [facility] ENDOSCOPY;  Service: Gastroenterology;  Laterality: N/A;  . TONSILLECTOMY      Family Psychiatric History: I have reviewed family psychiatric history from my progress note on 2019-08-21.  Family History:  Family  History  Problem Relation Age of Onset  . Mental illness Father   . Bipolar disorder Brother   . Bipolar disorder Paternal Uncle   . Bipolar disorder Paternal Uncle     Social History: I have reviewed social history from my progress note on 2019-08-21. Social History   Socioeconomic History  . Marital status: Divorced    Spouse  name: Not on file  . Number of children: 2  . Years of education: Not on file  . Highest education level: Not on file  Occupational History  . Not on file  Tobacco Use  . Smoking status: Former Smoker    Types: Cigarettes    Quit date: 03/03/2011    Years since quitting: 9.5  . Smokeless tobacco: Never Used  Vaping Use  . Vaping Use: Never used  Substance and Sexual Activity  . Alcohol use: Not Currently  . Drug use: Not Currently  . Sexual activity: Not on file  Other Topics Concern  . Not on file  Social History Narrative  . Not on file   Social Determinants of Health   Financial Resource Strain:   . Difficulty of Paying Living Expenses: Not on file  Food Insecurity:   . Worried About Charity fundraiser in the Last Year: Not on file  . Ran Out of Food in the Last Year: Not on file  Transportation Needs:   . Lack of Transportation (Medical): Not on file  . Lack of Transportation (Non-Medical): Not on file  Physical Activity:   . Days of Exercise per Week: Not on file  . Minutes of Exercise per Session: Not on file  Stress:   . Feeling of Stress : Not on file  Social Connections:   . Frequency of Communication with Friends and Family: Not on file  . Frequency of Social Gatherings with Friends and Family: Not on file  . Attends Religious Services: Not on file  . Active Member of Clubs or Organizations: Not on file  . Attends Archivist Meetings: Not on file  . Marital Status: Not on file    Allergies:  Allergies  Allergen Reactions  . Lithium Nausea And Vomiting  . Penicillins Hives    Resulted in hospitalization  . Sulfa Antibiotics Hives    Resulted in hospitalization    Metabolic Disorder Labs: Lab Results  Component Value Date   HGBA1C 7.3 (H) 06/22/2019   MPG 162.81 06/22/2019   No results found for: PROLACTIN No results found for: CHOL, TRIG, HDL, CHOLHDL, VLDL, LDLCALC Lab Results  Component Value Date   TSH 1.737 11/16/2019     Therapeutic Level Labs: No results found for: LITHIUM No results found for: VALPROATE No components found for:  CBMZ  Current Medications: Current Outpatient Medications  Medication Sig Dispense Refill  . acetaminophen (TYLENOL) 500 MG tablet Take 500 mg by mouth every 6 (six) hours as needed.     Marland Kitchen alendronate (FOSAMAX) 35 MG tablet Take 35 mg by mouth every 7 (seven) days. Take with a full glass of water on an empty stomach.    Marland Kitchen amLODipine (NORVASC) 10 MG tablet Take 10 mg by mouth daily.    . ARIPiprazole (ABILIFY) 15 MG tablet Take 0.5 tablets (7.5 mg total) by mouth daily. 45 tablet 0  . azithromycin (ZITHROMAX) 250 MG tablet TAKE AS DIRECTED PEROPACKAGE INSTRUCTIONS. (Patient not taking: Reported on 08/28/2020)    . B Complex Vitamins (VITAMIN B-COMPLEX) TABS Take 1 tablet by mouth daily.  (  Patient not taking: Reported on 08/28/2020)    . B Complex-C (B-COMPLEX WITH VITAMIN C) tablet Take 1 tablet by mouth daily. 30 tablet 2  . bisacodyl (DULCOLAX) 5 MG EC tablet Take 5 mg by mouth daily as needed for moderate constipation.    . bisoprolol-hydrochlorothiazide (ZIAC) 5-6.25 MG tablet Take 1 tablet by mouth daily.    . cetirizine (ZYRTEC) 10 MG tablet Take 10 mg by mouth daily.    . clindamycin (CLEOCIN) 150 MG capsule TAKE 4 CAPSULES 1 HOURABEFORE DENTAL APPOINTMENT.    . cloNIDine (CATAPRES) 0.1 MG tablet clonidine HCl 0.1 mg tablet  TAKE 1 TABLET BY MOUTH 2 TIMES A DAY    . Dulaglutide (TRULICITY) 5.95 GL/8.7FI SOPN Inject 0.75 mg into the skin once a week. Thursday    . Ertugliflozin L-PyroglutamicAc (STEGLATRO) 15 MG TABS Take by mouth.     . ferrous sulfate 325 (65 FE) MG tablet Take 1 tablet (325 mg total) by mouth 2 (two) times daily with a meal. 120 tablet 0  . fluconazole (DIFLUCAN) 100 MG tablet Take 100 mg by mouth daily.  (Patient not taking: Reported on 08/28/2020)    . furosemide (LASIX) 20 MG tablet Take 10 mg by mouth daily as needed.    . gabapentin (NEURONTIN)  600 MG tablet TAKE (1) TABLET BY MOUTH (3) TIMES DAILY. 270 tablet 0  . glimepiride (AMARYL) 2 MG tablet Take 2 mg by mouth daily with breakfast.     . hydrochlorothiazide (HYDRODIURIL) 25 MG tablet Take 25 mg by mouth daily.     Marland Kitchen HYDROcodone-acetaminophen (NORCO) 10-325 MG tablet Take 1 tablet by mouth 3 (three) times daily. For chronic pain syndrome. To last for 30 days from fill date. 90 tablet 0  . [START ON 09/10/2020] HYDROcodone-acetaminophen (NORCO) 10-325 MG tablet Take 1 tablet by mouth 3 (three) times daily. For chronic pain syndrome. To last for 30 days from fill date. 90 tablet 0  . Insulin Glargine (LANTUS SOLOSTAR) 100 UNIT/ML Solostar Pen Inject 60 Units into the skin at bedtime.     . lamoTRIgine (LAMICTAL) 200 MG tablet Take 1 tablet (200 mg total) by mouth daily. To be combined with 50 mg Lamictal 90 tablet 0  . lamoTRIgine (LAMICTAL) 25 MG tablet Take 1 tablet (25 mg total) by mouth 2 (two) times daily. To be combined with 200 mg daily 30 tablet 1  . LANTUS SOLOSTAR 100 UNIT/ML Solostar Pen SMARTSIG:60 Unit(s) SUB-Q Every Night    . losartan (COZAAR) 25 MG tablet Take 25 mg by mouth daily. Takes differently    . losartan (COZAAR) 50 MG tablet Take 50 mg by mouth daily.    . meclizine (ANTIVERT) 25 MG tablet Take 25 mg by mouth 2 (two) times daily as needed.    . meloxicam (MOBIC) 7.5 MG tablet TAKE 1 TABLET BY MOUTH EVERY DAY    . metFORMIN (GLUCOPHAGE) 500 MG tablet Take 500 mg by mouth 2 (two) times daily with a meal.    . pantoprazole (PROTONIX) 40 MG tablet Take 40 mg by mouth daily.    Marland Kitchen spironolactone (ALDACTONE) 25 MG tablet Take 25 mg by mouth daily.     No current facility-administered medications for this visit.     Musculoskeletal: Strength & Muscle Tone: UTA Gait & Station: UTA Patient leans: N/A  Psychiatric Specialty Exam: Review of Systems  Musculoskeletal:       Knee pain  Psychiatric/Behavioral:       Crying spells  All other  systems reviewed and  are negative.   There were no vitals taken for this visit.There is no height or weight on file to calculate BMI.  General Appearance: Casual  Eye Contact:  Fair  Speech:  Normal Rate  Volume:  Normal  Mood:  Mood swings- crying spells  Affect:  Appropriate  Thought Process:  Goal Directed and Descriptions of Associations: Intact  Orientation:  Full (Time, Place, and Person)  Thought Content: Logical   Suicidal Thoughts:  No  Homicidal Thoughts:  No  Memory:  Immediate;   Fair Recent;   Fair Remote;   Fair  Judgement:  Fair  Insight:  Fair  Psychomotor Activity:  Normal  Concentration:  Concentration: Fair and Attention Span: Fair  Recall:  [employer] of Knowledge: Fair  Language: Fair  Akathisia:  No  Handed:  Right  AIMS (if indicated): UTA  Assets:  Communication Skills Desire for Improvement Housing Social Support  ADL's:  Intact  Cognition: WNL  Sleep:  Fair   Screenings: PHQ2-9     Procedure visit from 08/28/2020 in [facility]  PHQ-2 Total Score 0       Assessment and Plan: Dayzha Pogosyan is a 64 year old Caucasian female on disability, divorced, currently lives in [place], has a history of bipolar disorder, chronic pain, insomnia, diabetes melitis, hypertension, hyperlipidemia was evaluated by telemedicine today.  Patient is biologically predisposed given her family history, history of trauma.  Patient with psychosocial stressors of her mother's death, current pandemic, daughter's relocation.  Patient continues to struggle with crying spells and will benefit from medication readjustment and psychotherapy session referral.  Plan Bipolar disorder type I depressed, mild-unstable Increase lamotrigine to 250 mg p.o. daily Abilify 7.5 mg p.o. daily Gabapentin 600 mg p.o. 3 times daily  Insomnia-stable We'll monitor closely  Patient to be referred for CBT.  Patient advised to make use of psychotherapy  sessions.  Follow-up in clinic in 3 to 4 weeks or sooner if needed.  I have spent atleast 20 minutes face to face by video with patient today. More than 50 % of the time was spent for preparing to see the patient ( e.g., review of test, records ), ordering medications and test ,psychoeducation and supportive psychotherapy and care coordination,as well as documenting clinical information in electronic health record. This note was generated in part or whole with voice recognition software. Voice recognition is usually quite accurate but there are transcription errors that can and very often do occur. I apologize for any typographical errors that were not detected and corrected.        Ursula Alert, [name] 09/09/2020, 2:00 PM

## 2020-09-17 ENCOUNTER — Other Ambulatory Visit: Payer: Self-pay

## 2020-09-17 ENCOUNTER — Ambulatory Visit (INDEPENDENT_AMBULATORY_CARE_PROVIDER_SITE_OTHER): Payer: Medicaid Other | Admitting: Licensed Clinical Social Worker

## 2020-09-17 ENCOUNTER — Encounter: Payer: Self-pay | Admitting: Licensed Clinical Social Worker

## 2020-09-17 DIAGNOSIS — Z634 Disappearance and death of family member: Secondary | ICD-10-CM | POA: Diagnosis not present

## 2020-09-17 DIAGNOSIS — F3131 Bipolar disorder, current episode depressed, mild: Secondary | ICD-10-CM
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
## 2020-09-18 ENCOUNTER — Encounter: Payer: Self-pay | Admitting: Student in an Organized Health Care Education/Training Program

## 2020-09-18 ENCOUNTER — Ambulatory Visit
Admission: RE | Admit: 2020-09-18 | Discharge: 2020-09-18 | Disposition: A | Discharge status: Home / Self Care | Payer: Medicaid Other | Source: Ambulatory Visit | Attending: Student in an Organized Health Care Education/Training Program | Admitting: Student in an Organized Health Care Education/Training Program

## 2020-09-18 ENCOUNTER — Ambulatory Visit (HOSPITAL_BASED_OUTPATIENT_CLINIC_OR_DEPARTMENT_OTHER): Payer: Medicaid Other | Admitting: Student in an Organized Health Care Education/Training Program

## 2020-09-18 VITALS — BP 141/40 | HR 56 | Temp 97.8°F | Resp 16 | Ht 61.0 in | Wt 200.0 lb

## 2020-09-18 DIAGNOSIS — G8929 Other chronic pain: Secondary | ICD-10-CM | POA: Insufficient documentation

## 2020-09-18 DIAGNOSIS — G894 Chronic pain syndrome: Secondary | ICD-10-CM

## 2020-09-18 DIAGNOSIS — M542 Cervicalgia: Secondary | ICD-10-CM

## 2020-09-18 DIAGNOSIS — Z0289 Encounter for other administrative examinations: Secondary | ICD-10-CM

## 2020-09-18 DIAGNOSIS — M25511 Pain in right shoulder: Secondary | ICD-10-CM | POA: Diagnosis present

## 2020-09-18 DIAGNOSIS — Z79891 Long term (current) use of opiate analgesic: Secondary | ICD-10-CM | POA: Insufficient documentation

## 2020-09-18 DIAGNOSIS — M170 Bilateral primary osteoarthritis of knee: Secondary | ICD-10-CM | POA: Diagnosis not present

## 2020-09-18 IMAGING — CR DG CERVICAL SPINE WITH FLEX & EXTEND
7 series · 7 of 7 positions shown · non-contrast
Comparison: None.

CLINICAL DATA: Chronic neck and right shoulder pain without known
injury.

EXAM:
CERVICAL SPINE COMPLETE WITH FLEXION AND EXTENSION VIEWS

[c-spine lat]
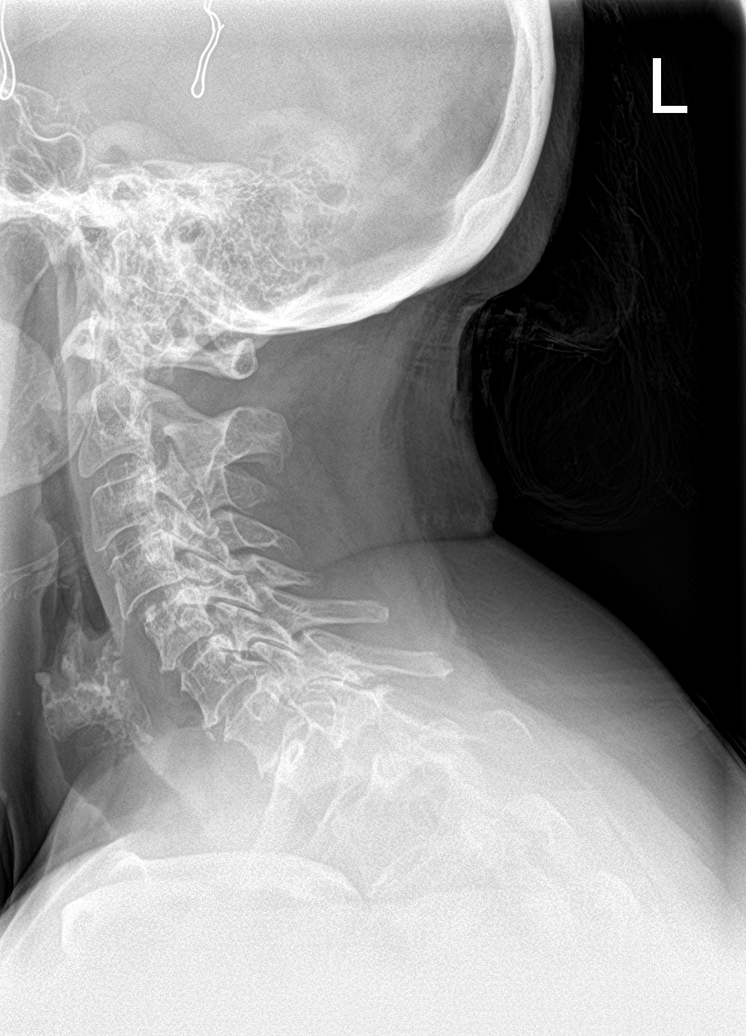

[c-spine flex]
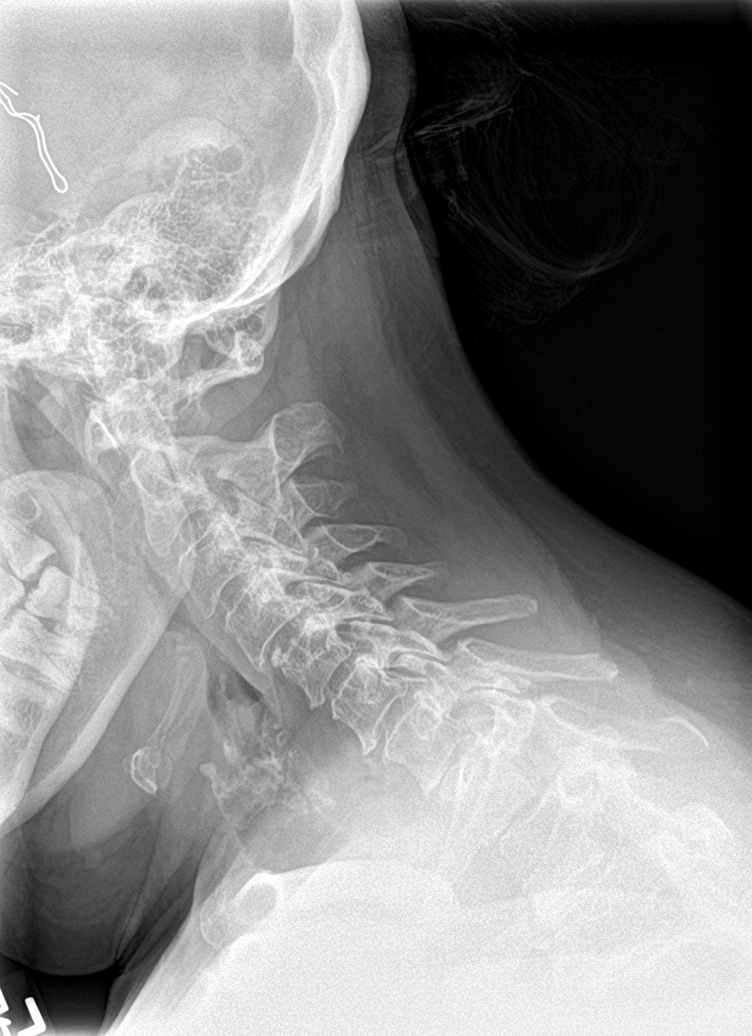

[c-spine ext]
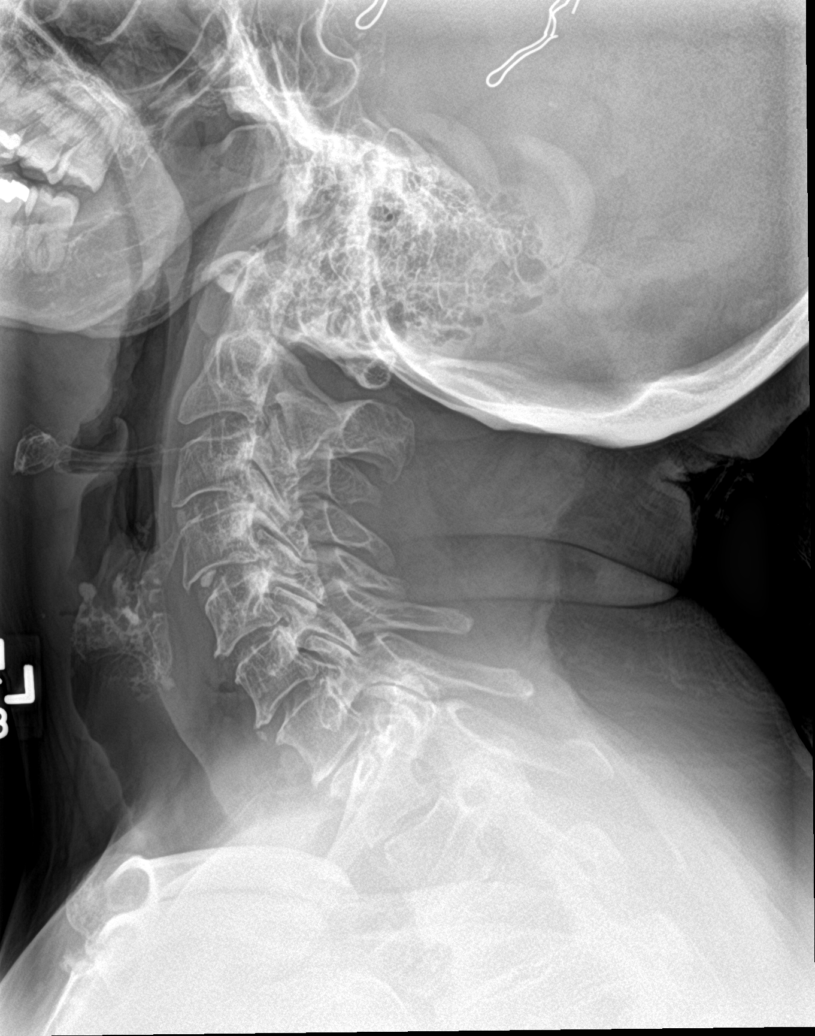

[c-spine obl (1 of 2)]
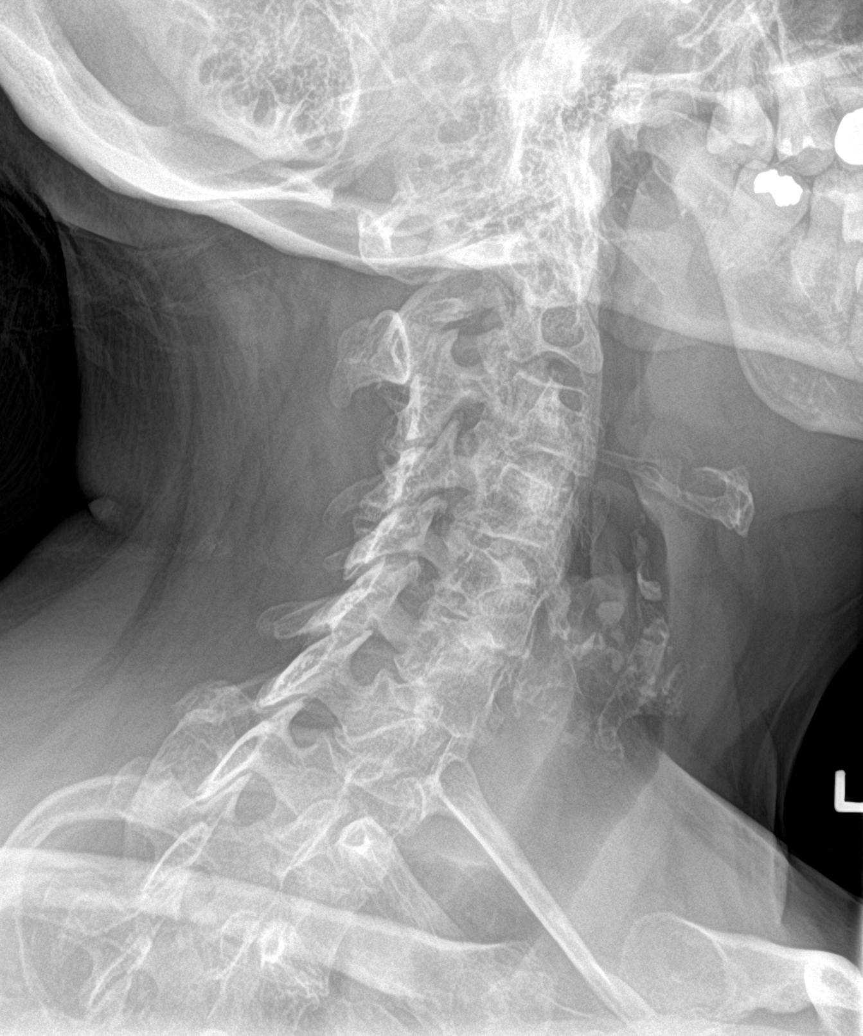

[c-spine obl (2 of 2)]
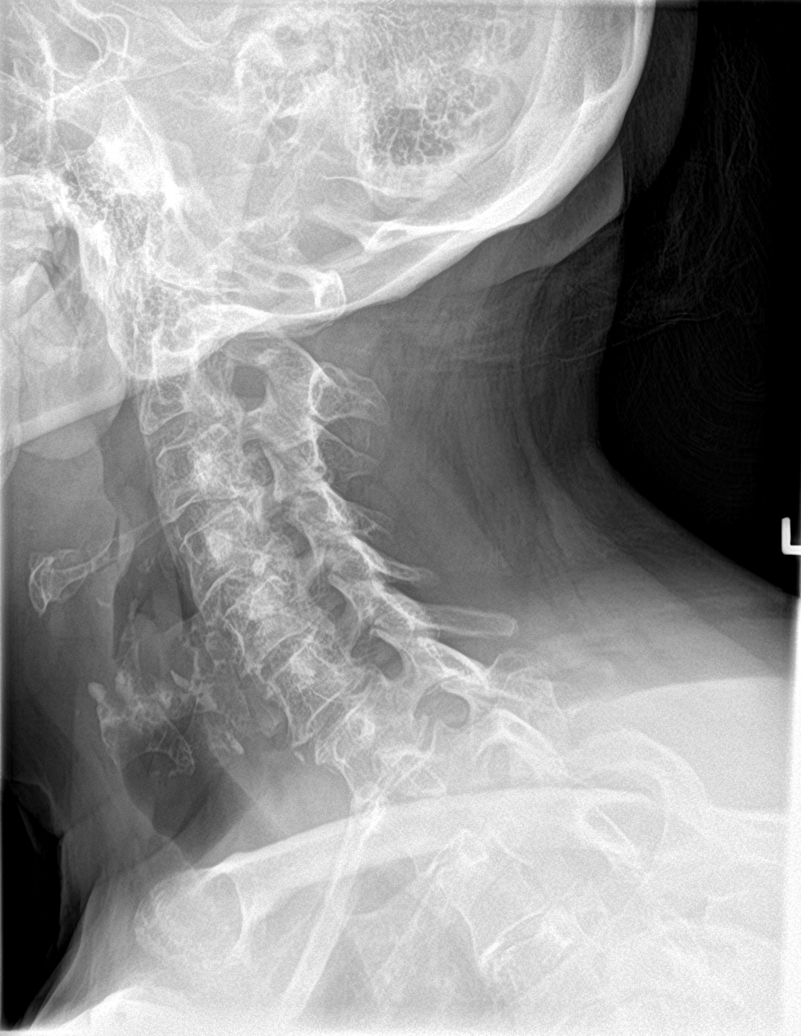

[c-spine ap]
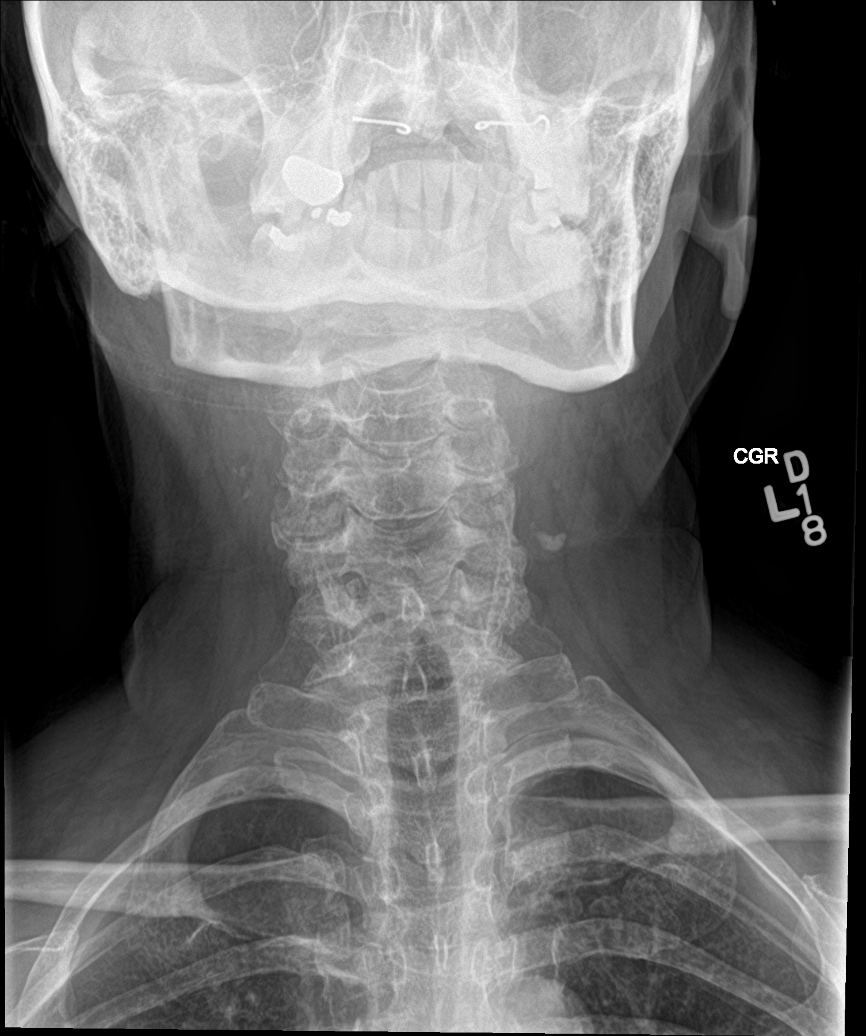

[c-spine open mouth]
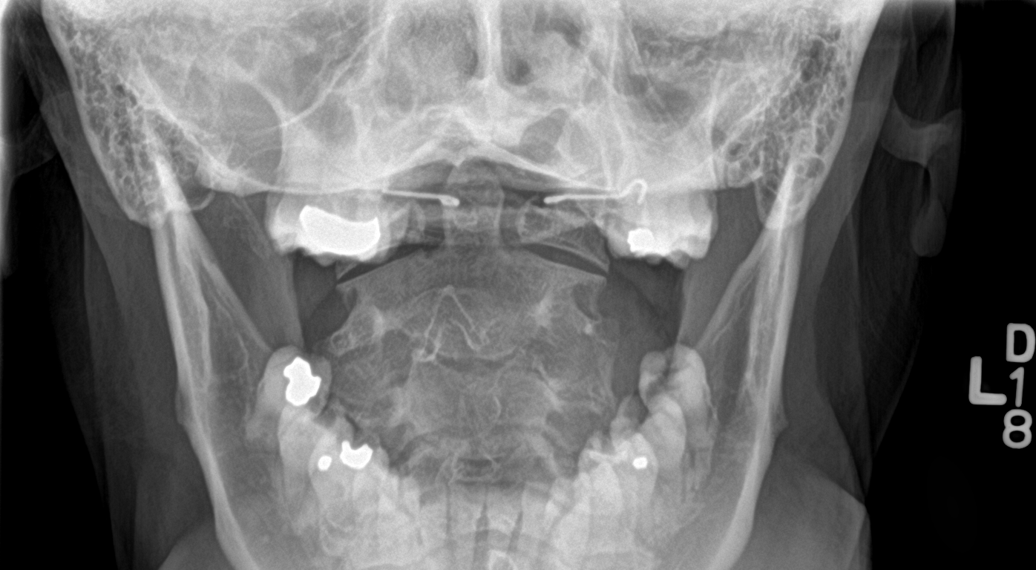

[7 of 7 positions shown; findings below may reference images not displayed]

FINDINGS: Minimal grade 1 anterolisthesis of C4-5 is noted. No definite
fracture is noted. No significant change in vertebral body alignment
is noted on flexion or extension views. Mild to moderate
degenerative disc disease is noted at C4-5, C5-6, C6-7 and C7-T1.
Moderate bilateral neural foraminal stenosis is noted at C3-4, C4-5
and C5-6 secondary to uncovertebral spurring. Possible carotid
artery calcifications are seen in the left cervical soft tissues.
IMPRESSION: Minimal grade 1 anterolisthesis of C4-5 is noted. Mild to moderate
multilevel degenerative disc disease is noted. Moderate bilateral
neural foraminal stenosis is noted secondary to uncovertebral
spurring. No acute abnormality seen in the cervical spine.

Possible carotid artery calcifications seen in the left cervical
soft tissues. Carotid ultrasound is recommended for further
evaluation.

## 2020-09-18 IMAGING — CR DG SHOULDER 2+V*R*
3 series · 3 of 3 positions shown · non-contrast
Comparison: None.

CLINICAL DATA: Patient c/o neck and right shoulder pain x years.
With no trauma

EXAM:
RIGHT SHOULDER - 2+ VIEW

[shoulder grashey]
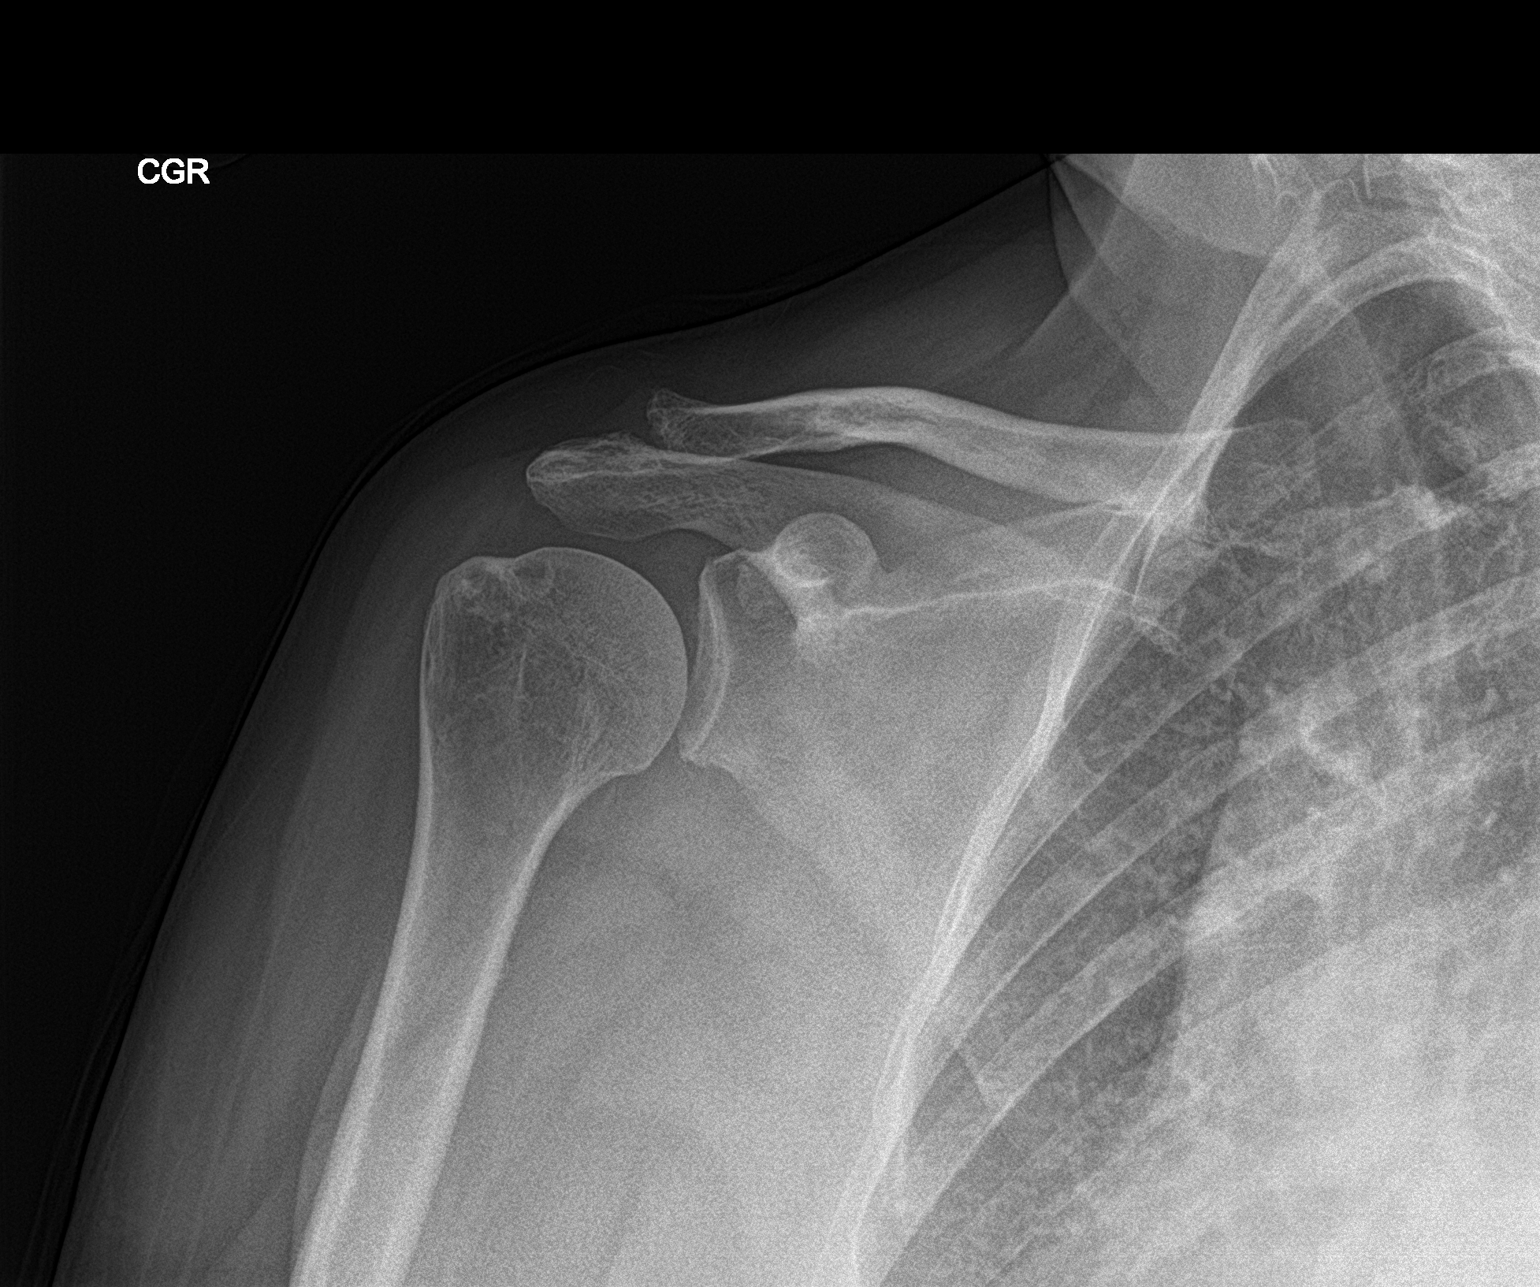

[shoulder y view]
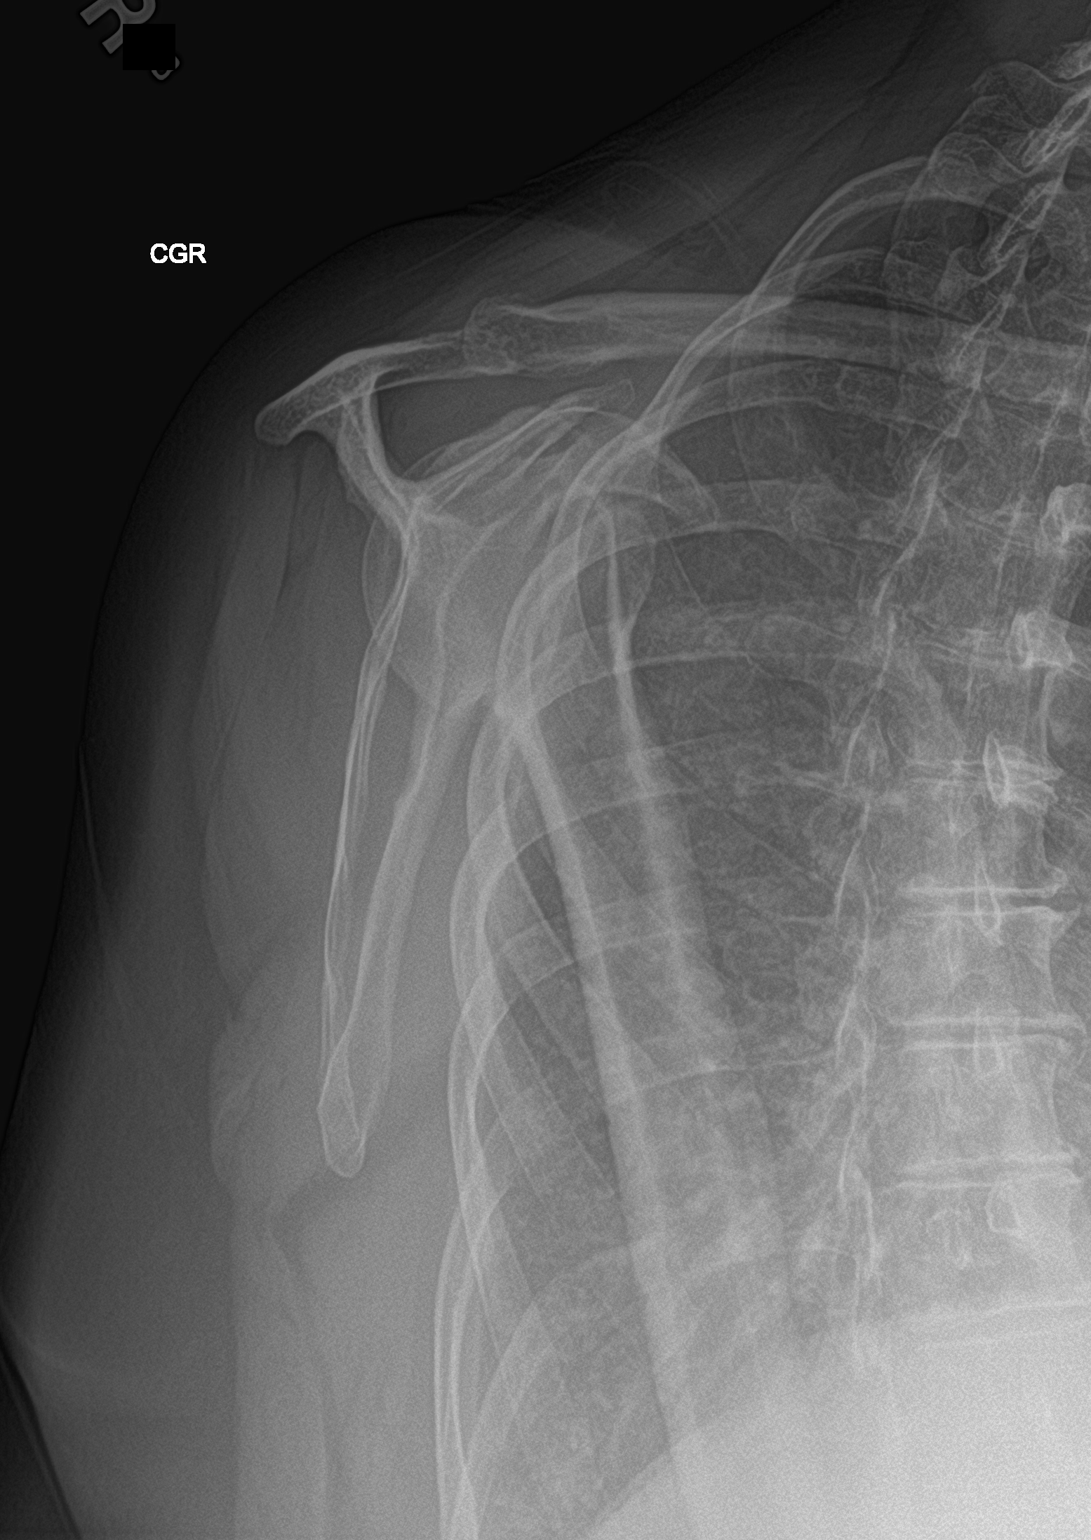

[shoulder axillary]
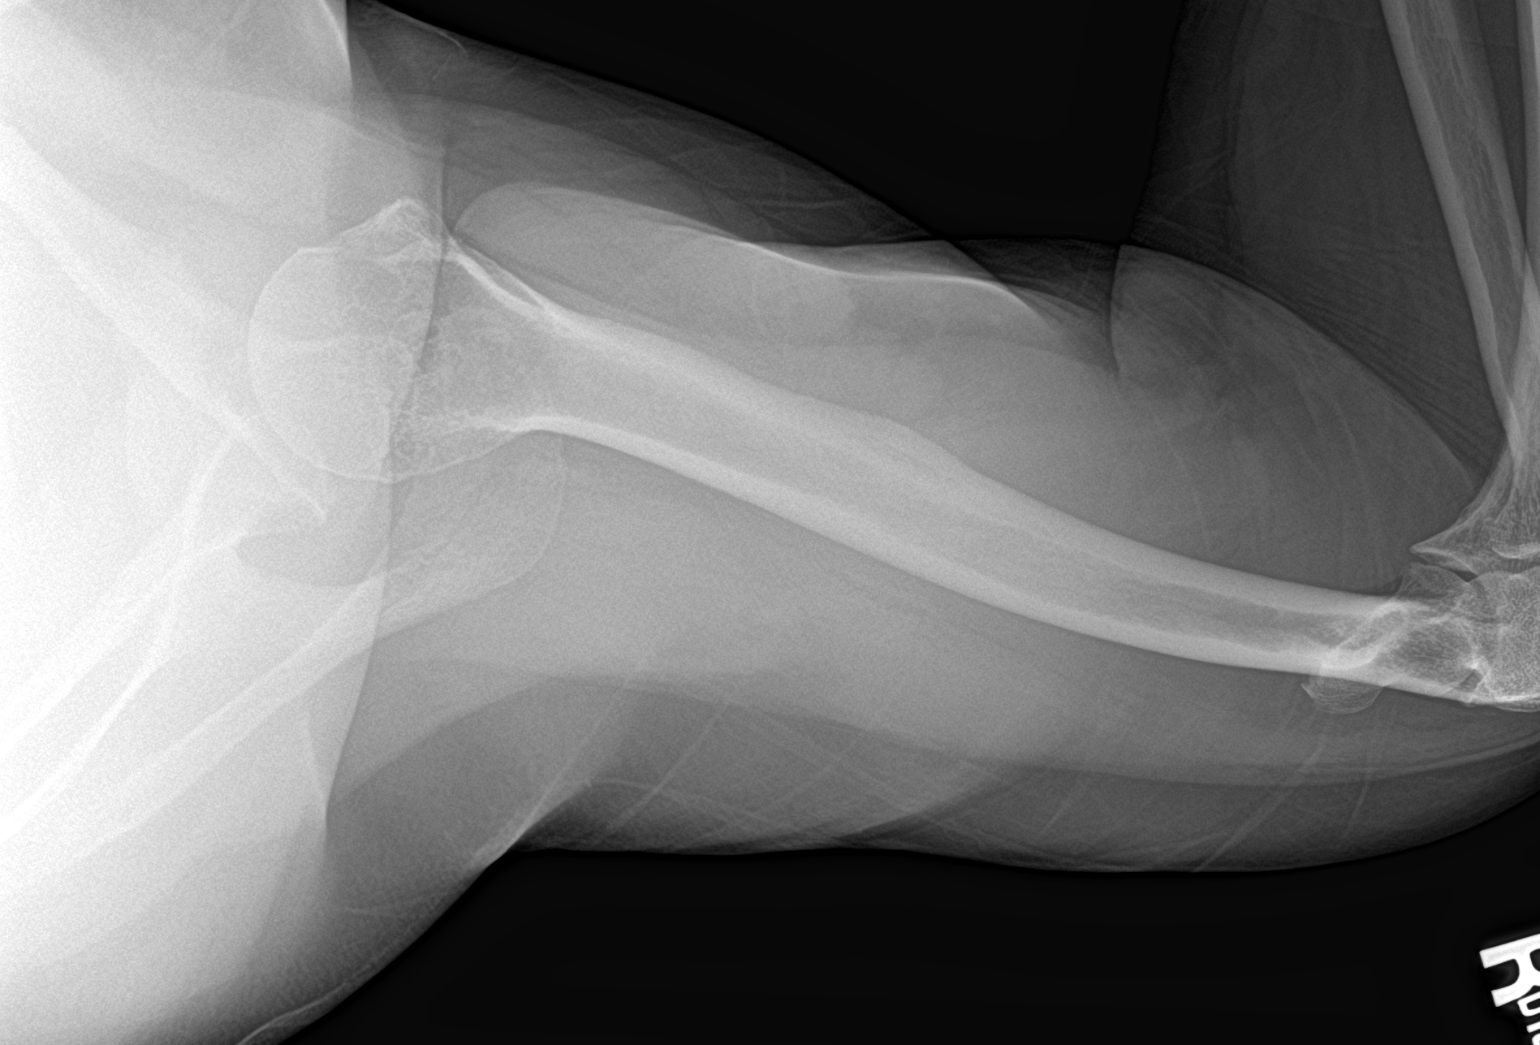

[3 of 3 positions shown; findings below may reference images not displayed]

FINDINGS: There is no evidence of fracture or dislocation. There are mild
degenerative changes at the glenohumeral and acromioclavicular
joints. No aggressive osseous lesion. Regional soft tissues are
unremarkable.
IMPRESSION: No acute osseous abnormality in the right shoulder. Mild
glenohumeral and AC joint degenerative changes.

## 2020-09-18 MED ORDER — HYDROCODONE-ACETAMINOPHEN 10-325 MG PO TABS: 1.0000 | ORAL_TABLET | Freq: Three times a day (TID) | ORAL | 0 refills | Status: DC

## 2020-09-18 MED ORDER — HYDROCODONE-ACETAMINOPHEN 10-325 MG PO TABS: 1.0000 | ORAL_TABLET | Freq: Three times a day (TID) | ORAL | 0 refills | Status: AC

## 2020-09-18 NOTE — Progress Notes (Signed)
Nursing Pain Medication Assessment:  Safety precautions to be maintained throughout the outpatient stay will include: orient to surroundings, keep bed in low position, maintain call bell within reach at all times, provide assistance with transfer out of bed and ambulation.  Medication Inspection Compliance: Ms. Silvestri did not comply with our request to bring her pills to be counted. She was reminded that bringing the medication bottles, even when empty, is a requirement.  Medication: None brought in. Pill/Patch Count: None available to be counted. Bottle Appearance: No container available. Did not bring bottle(s) to appointment. Filled Date: N/A Last Medication intake:  Today  Reminded to bring pills for count

## 2020-09-18 NOTE — Progress Notes (Signed)
PROVIDER NOTE: Information contained herein reflects review and annotations entered in association with encounter. Interpretation of such information and data should be left to medically-trained personnel. Information provided to patient can be located elsewhere in the medical record under "Patient Instructions". Document created using STT-dictation technology, any transcriptional errors that may result from process are unintentional.    Patient: Margaret [name]  Service Category: E/M  Provider: Gillis Santa, MD  DOB: Aug 17, 1956  DOS: 09/18/2020  Specialty: Interventional Pain Management  MRN: [id number]  Setting: Ambulatory outpatient  PCP: Rusty Aus, MD  Type: Established Patient    Referring Provider: Rusty Aus, MD  Location: Office  Delivery: Face-to-face     HPI  Margaret [name], a 64 y.o. year old female, is here today because of her Bilateral primary osteoarthritis of knee [M17.0]. Margaret [name] primary complain today is Hip Pain (left), Leg Pain (left), and Arm Pain (bilateral) Last encounter: My last encounter with her was on 08/28/2020. Pertinent problems: Margaret [name] does not have any pertinent problems on file. Pain Assessment: Severity of Chronic pain is reported as a 7 /10. Location: Hip Left/radiates into left leg down to ankle. Onset: More than a month ago. Quality: Aching. Timing: Constant. Modifying factor(s): meds. Vitals:  height is 5' 1" (1.549 m) and weight is 200 lb (90.7 kg). Her temperature is 97.8 F (36.6 C). Her blood pressure is 141/40 (abnormal) and her pulse is 56 (abnormal). Her respiration is 16 and oxygen saturation is 99%.   Reason for encounter: medication management.   Patient presents today for medication management and she was scheduled for her second bilateral intra-articular Hyalgan injection. She endorses no knee pain and states that she is doing well from a knee standpoint after her first intra-articular Hyalgan injection. We can repeat this in the  future should she have return of knee pain. She is now endorsing worsening left hip pain. History of left hip replacement. Is having records sent from [place] to her orthopedist here locally to assess. She is also endorsing right neck right shoulder and right bicep pain. No pain with abduction. Will obtain x-rays of her cervical spine and shoulder. We will also refill her Norco as below. No change in dose.  Pharmacotherapy Assessment   Analgesic: Norco 10 mg TID prn   Monitoring: [place] PMP: PDMP not reviewed this encounter.       Pharmacotherapy: No side-effects or adverse reactions reported. Compliance: No problems identified. Effectiveness: Clinically acceptable.  Dewayne Shorter, RN  09/18/2020 10:30 AM  Signed Nursing Pain Medication Assessment:  Safety precautions to be maintained throughout the outpatient stay will include: orient to surroundings, keep bed in low position, maintain call bell within reach at all times, provide assistance with transfer out of bed and ambulation.  Medication Inspection Compliance: Margaret [name] did not comply with our request to bring her pills to be counted. She was reminded that bringing the medication bottles, even when empty, is a requirement.  Medication: None brought in. Pill/Patch Count: None available to be counted. Bottle Appearance: No container available. Did not bring bottle(s) to appointment. Filled Date: N/A Last Medication intake:  Today  Reminded to bring pills for count    UDS:  Summary  Date Value Ref Range Status  06/20/2020 Note  Final    Comment:    ==================================================================== Compliance Drug Analysis, Ur ==================================================================== Test  Result       Flag       Units  Drug Present and Declared for Prescription Verification   Hydrocodone                    2260         EXPECTED   ng/mg creat   Norhydrocodone                  2128         EXPECTED   ng/mg creat    Sources of hydrocodone include scheduled prescription medications.    Norhydrocodone is an expected metabolite of hydrocodone.    Gabapentin                     PRESENT      EXPECTED   Lamotrigine                    PRESENT      EXPECTED   Citalopram                     PRESENT      EXPECTED   Desmethylcitalopram            PRESENT      EXPECTED    Desmethylcitalopram is an expected metabolite of citalopram or the    enantiomeric form, escitalopram.    Aripiprazole                   PRESENT      EXPECTED   Acetaminophen                  PRESENT      EXPECTED  Drug Present not Declared for Prescription Verification   Naproxen                       PRESENT      UNEXPECTED  Drug Absent but Declared for Prescription Verification   Mirtazapine                    Not Detected UNEXPECTED   Propranolol                    Not Detected UNEXPECTED ==================================================================== Test                      Result    Flag   Units      Ref Range   Creatinine              25               mg/dL      >=20 ==================================================================== Declared Medications:  The flagging and interpretation on this report are based on the  following declared medications.  Unexpected results may arise from  inaccuracies in the declared medications.   **Note: The testing scope of this panel includes these medications:   Aripiprazole (Abilify)  Citalopram (Celexa)  Gabapentin  Hydrocodone (Norco)  Lamotrigine (Lamictal)  Mirtazapine (Remeron)  Propranolol   **Note: The testing scope of this panel does not include small to  moderate amounts of these reported medications:   Acetaminophen (Tylenol)  Acetaminophen (Norco)   **Note: The testing scope of this panel does not include the  following reported medications:   Alendronate (Fosamax)  Azithromycin  Cetirizine (Zyrtec)  Clindamycin  (Cleocin)  Dulaglutide (Trulicity)  Ertugliflozin Actuary)  Fluconazole (Diflucan)  Furosemide (Lasix)  Glimepiride  Hydrochlorothiazide  Insulin (Lantus)  Losartan (Cozaar)  Meclizine (Antivert)  Meloxicam (Mobic)  Metformin  Pantoprazole (Protonix)  Spironolactone  Vitamin B  Vitamin C ==================================================================== For clinical consultation, please call [phone]. ====================================================================      ROS  Constitutional: Denies any fever or chills Gastrointestinal: No reported hemesis, hematochezia, vomiting, or acute GI distress Musculoskeletal: Right neck, right arm, right shoulder pain, left hip pain Neurological: No reported episodes of acute onset apraxia, aphasia, dysarthria, agnosia, amnesia, paralysis, loss of coordination, or loss of consciousness  Medication Review  ARIPiprazole, B-complex with vitamin C, Dulaglutide, HYDROcodone-acetaminophen, acetaminophen, alendronate, amLODipine, bisacodyl, bisoprolol-hydrochlorothiazide, cetirizine, clindamycin, cloNIDine, ertugliflozin L-PyroglutamicAc, ferrous sulfate, furosemide, gabapentin, glimepiride, hydrochlorothiazide, insulin glargine, lamoTRIgine, losartan, meclizine, meloxicam, metFORMIN, pantoprazole, and spironolactone  History Review  Allergy: Margaret [name] is allergic to lithium, penicillins, and sulfa antibiotics. Drug: Margaret [name]  reports previous drug use. Alcohol:  reports previous alcohol use. Tobacco:  reports that she quit smoking about 9 years ago. Her smoking use included cigarettes. She has never used smokeless tobacco. Social: Margaret [name]  reports that she quit smoking about 9 years ago. Her smoking use included cigarettes. She has never used smokeless tobacco. She reports previous alcohol use. She reports previous drug use. Medical:  has a past medical history of Bipolar 1 disorder ([place]), Diabetes mellitus without  complication ([place]), and Hypertension. Surgical: Margaret [name]  has a past surgical history that includes Abdominal hysterectomy; Tonsillectomy; Cholecystectomy; Esophagogastroduodenoscopy (egd) with propofol (N/A, 06/23/2019); and Colonoscopy with propofol (N/A, 06/23/2019). Family: family history includes Bipolar disorder in her brother, paternal uncle, and paternal uncle; Mental illness in her father.  Laboratory Chemistry Profile   Renal Lab Results  Component Value Date   BUN 11 12/06/2019   CREATININE 0.59 12/06/2019   BCR 19 12/06/2019   GFRAA 114 12/06/2019   GFRNONAA 99 12/06/2019     Hepatic Lab Results  Component Value Date   AST 48 (H) 12/06/2019   ALT 47 (H) 12/06/2019   ALBUMIN 3.7 (L) 12/06/2019   ALKPHOS 122 (H) 12/06/2019     Electrolytes Lab Results  Component Value Date   NA 137 12/06/2019   K 3.9 12/06/2019   CL 101 12/06/2019   CALCIUM 9.7 12/06/2019     Bone No results found for: VD25OH, VD125OH2TOT, RJ1884ZY6, AY3016WF0, 25OHVITD1, 25OHVITD2, 25OHVITD3, TESTOFREE, TESTOSTERONE   Inflammation (CRP: Acute Phase) (ESR: Chronic Phase) No results found for: CRP, ESRSEDRATE, LATICACIDVEN     Note: Above Lab results reviewed.  Recent Imaging Review  MR LIVER W WO CONTRAST CLINICAL DATA:  Cirrhosis. Indeterminate liver lesion on recent ultrasound.  EXAM: MRI ABDOMEN WITHOUT AND WITH CONTRAST  TECHNIQUE: Multiplanar multisequence MR imaging of the abdomen was performed both before and after the administration of intravenous contrast.  CONTRAST:  37m GADAVIST GADOBUTROL 1 MMOL/ML IV SOLN  COMPARISON:  Ultrasound on 12/20/2019 and CT on 06/21/2019  FINDINGS: Lower chest: No acute findings.  Hepatobiliary: Image degradation by motion artifact noted. Hepatic cirrhosis again demonstrated. No liver masses identified. Prior cholecystectomy noted. No evidence of biliary obstruction.  Pancreas:  No mass or inflammatory changes.  Spleen: Moderate  splenomegaly again seen measuring approximately 16 cm in length. No splenic masses identified.  Adrenals/Urinary Tract: No masses identified. A few tiny sub-cm renal cysts are noted bilaterally. No evidence of hydronephrosis.  Stomach/Bowel: Visualized portion unremarkable.  Vascular/Lymphatic: Mild lymphadenopathy is seen in the gastrohepatic ligament, porta hepatis, portacaval space, likely reactive in etiology.  Other:  None.  Musculoskeletal:  No suspicious bone lesions identified.  IMPRESSION: 1. Image degradation by motion artifact noted. Hepatic cirrhosis. No evidence of hepatic neoplasm. 2. Moderate splenomegaly, consistent with portal venous hypertension. 3. Mild lymphadenopathy in gastrohepatic ligament, porta hepatis, and portacaval space, likely reactive in etiology.  Electronically Signed   By: Marlaine Hind M.D.   On: 01/06/2020 17:16 Note: Reviewed        Physical Exam  General appearance: Well nourished, well developed, and well hydrated. In no apparent acute distress Mental status: Alert, oriented x 3 (person, place, & time)       Respiratory: No evidence of acute respiratory distress Eyes: PERLA Vitals: BP (!) 141/40    Pulse (!) 56    Temp 97.8 F (36.6 C)    Resp 16    Ht 5' 1" (1.549 m)    Wt 200 lb (90.7 kg)    SpO2 99%    BMI 37.79 kg/m  BMI: Estimated body mass index is 37.79 kg/m as calculated from the following:   Height as of this encounter: 5' 1" (1.549 m).   Weight as of this encounter: 200 lb (90.7 kg). Ideal: Ideal body weight: 47.8 kg (105 lb 6.1 oz) Adjusted ideal body weight: 65 kg (143 lb 3.6 oz)  Cervical Spine Area Exam  Skin & Axial Inspection: No masses, redness, edema, swelling, or associated skin lesions Alignment: Symmetrical Functional ROM: Pain restricted ROM, to the right Stability: No instability detected Muscle Tone/Strength: Functionally intact. No obvious neuro-muscular anomalies detected. Sensory (Neurological):  Musculoskeletal pain pattern Palpation: No palpable anomalies             Upper Extremity (UE) Exam    Side: Right upper extremity  Side: Left upper extremity  Skin & Extremity Inspection: Skin color, temperature, and hair growth are WNL. No peripheral edema or cyanosis. No masses, redness, swelling, asymmetry, or associated skin lesions. No contractures.  Skin & Extremity Inspection: Skin color, temperature, and hair growth are WNL. No peripheral edema or cyanosis. No masses, redness, swelling, asymmetry, or associated skin lesions. No contractures.  Functional ROM: Pain restricted ROM for shoulder  Functional ROM: Unrestricted ROM          Muscle Tone/Strength: Functionally intact. No obvious neuro-muscular anomalies detected.  Muscle Tone/Strength: Functionally intact. No obvious neuro-muscular anomalies detected.  Sensory (Neurological): Musculoskeletal pain pattern          Sensory (Neurological): Unimpaired          Palpation: No palpable anomalies              Palpation: No palpable anomalies              Provocative Test(s):  Phalen's test: deferred Tinel's test: deferred Apley's scratch test (touch opposite shoulder):  Action 1 (Across chest): deferred Action 2 (Overhead): deferred Action 3 (LB reach): deferred   Provocative Test(s):  Phalen's test: deferred Tinel's test: deferred Apley's scratch test (touch opposite shoulder):  Action 1 (Across chest): deferred Action 2 (Overhead): deferred Action 3 (LB reach): deferred    Gait & Posture Assessment  Ambulation: Patient came in today in a wheel chair Gait: Limited. Using assistive device to ambulate Posture: Difficulty standing up straight, due to pain  Lower Extremity Exam    Side: Right lower extremity  Side: Left lower extremity  Stability: No instability observed          Stability: No instability observed          Skin & Extremity  Inspection: Skin color, temperature, and hair growth are WNL. No peripheral edema or  cyanosis. No masses, redness, swelling, asymmetry, or associated skin lesions. No contractures.  Skin & Extremity Inspection: Evidence of prior arthroplastic surgery  Functional ROM: Unrestricted ROM                  Functional ROM: Pain restricted ROM for hip joint          Muscle Tone/Strength: Functionally intact. No obvious neuro-muscular anomalies detected.  Muscle Tone/Strength: Functionally intact. No obvious neuro-muscular anomalies detected.  Sensory (Neurological): Unimpaired        Sensory (Neurological): Arthropathic arthralgia        DTR: Patellar: deferred today Achilles: deferred today Plantar: deferred today  DTR: Patellar: deferred today Achilles: deferred today Plantar: deferred today  Palpation: No palpable anomalies  Palpation: No palpable anomalies    Assessment   Status Diagnosis  Controlled Controlled Controlled 1. Bilateral primary osteoarthritis of knee   2. Pain management contract signed   3. Encounter for long-term opiate analgesic use   4. Neck pain on right side   5. Pain in joint of right shoulder   6. Chronic pain syndrome      Updated Problems: Problem  Neck Pain On Right Side  Pain in Joint of Right Shoulder    Plan of Care  Margaret [name] has a current medication list which includes the following long-term medication(s): aripiprazole, bisoprolol-hydrochlorothiazide, cetirizine, clonidine, furosemide, gabapentin, lamotrigine, lamotrigine, losartan, losartan, metformin, pantoprazole, spironolactone, ferrous sulfate, and glimepiride.  Pharmacotherapy (Medications Ordered): Meds ordered this encounter  Medications   HYDROcodone-acetaminophen (NORCO) 10-325 MG tablet    Sig: Take 1 tablet by mouth 3 (three) times daily. For chronic pain syndrome. To last for 30 days from fill date.    Dispense:  90 tablet    Refill:  0    Ok to fill 1 day early if pharmacy closed on fill date   HYDROcodone-acetaminophen (NORCO) 10-325 MG tablet    Sig:  Take 1 tablet by mouth 3 (three) times daily. For chronic pain syndrome. To last for 30 days from fill date.    Dispense:  90 tablet    Refill:  0    Ok to fill 1 day early if pharmacy closed on fill date   Orders:  Orders Placed This Encounter  Procedures   DG Shoulder Right    Standing Status:   Future    Standing Expiration Date:   09/18/2021    Order Specific Question:   Reason for Exam (SYMPTOM  OR DIAGNOSIS REQUIRED)    Answer:   right arm pain    Order Specific Question:   Preferred imaging location?    Answer:   [facility]   DG Cervical Spine With Flex & Extend    Patient presents with axial pain with possible radicular component.  In addition to any acute findings, please report on:  1. Facet (Zygapophyseal) joint DJD (Hypertrophy, space narrowing, subchondral sclerosis, and/or osteophyte formation) 2. DDD and/or IVDD (Loss of disc height, desiccation or "Black disc disease") 3. Pars defects 4. Spondylolisthesis, spondylosis, and/or spondyloarthropathies (include Degree/Grade of displacement in mm) 5. Vertebral body Fractures, including age (old, new/acute) 76. Modic Type Changes 7. Demineralization 8. Bone pathology 9. Central, Lateral Recess, and/or Foraminal Stenosis (include AP diameter of stenosis in mm) 10. Surgical changes (hardware type, status, and presence of fibrosis) NOTE: Please specify level(s) and laterality. If applicable: Please indicate ROM and/or evidence of instability (>20m displacement  between flexion and extension views)    Standing Status:   Future    Standing Expiration Date:   12/19/2020    Scheduling Instructions:     Please evaluate for any evidence of cervical spine instability. Describe the presence of any spondylolisthesis (Antero- or retrolisthesis). If present, provide displacement "Grade" and measurement in cm. Please describe presence and specific location (Level & Laterality) of any signs of      osteoarthritis, zygapophyseal  (Facet) joints DJD (including decreased joint space and/or osteophytosis), DDD, Foraminal narrowing, as well as any sclerosis and/or cyst formation. Please comment on ROM.    Order Specific Question:   Reason for Exam (SYMPTOM  OR DIAGNOSIS REQUIRED)    Answer:   Cervicalgia    Order Specific Question:   Preferred imaging location?    Answer:   [facility]    Order Specific Question:   Call Results- Best Contact Number?    Answer:   [phone] ([facility])    Order Specific Question:   Radiology Contrast Protocol - do NOT remove file path    Answer:   [email or web address]   Follow-up plan:   Return in about 2 months (around 12/03/2020) for Medication Management, in person.      Interventional management options: Planned, scheduled, and/or pending:    Intra-articular knee Hyalgan series #1 done 08/28/20 helped significantly, repeat as needed   Considering:   Lumbar epidural steroid injection, lumbar facet medial branch nerve block pending lumbar MRI   PRN Procedures:   Repeat bilateral intra-articular Hyalgan injection      Recent Visits Date Type Provider Dept  08/28/20 Procedure visit Gillis Santa, MD [facility]  07/30/20 Office Visit Gillis Santa, MD [facility]  06/20/20 Office Visit Gillis Santa, MD [facility]  Showing recent visits within past 90 days and meeting all other requirements Today's Visits Date Type Provider Dept  09/18/20 Procedure visit Gillis Santa, MD [facility]  Showing today's visits and meeting all other requirements Future Appointments No visits were found meeting these conditions. Showing future appointments within next 90 days and meeting all other requirements  I discussed the assessment and treatment plan with the patient. The patient was provided an opportunity to ask questions and all were answered. The patient agreed with the plan and demonstrated an  understanding of the instructions.  Patient advised to call back or seek an in-person evaluation if the symptoms or condition worsens.  Duration of encounter:30 minutes.  Note by: Gillis Santa, MD Date: 09/18/2020; Time: 10:48 AM

## 2020-09-19 ENCOUNTER — Telehealth: Payer: Self-pay | Admitting: *Deleted

## 2020-09-19 ENCOUNTER — Encounter: Payer: Self-pay | Admitting: Student in an Organized Health Care Education/Training Program

## 2020-09-19 ENCOUNTER — Other Ambulatory Visit: Payer: Self-pay | Admitting: Student in an Organized Health Care Education/Training Program

## 2020-09-19 ENCOUNTER — Telehealth: Payer: Self-pay

## 2020-09-19 DIAGNOSIS — M5412 Radiculopathy, cervical region: Secondary | ICD-10-CM

## 2020-09-19 DIAGNOSIS — M47812 Spondylosis without myelopathy or radiculopathy, cervical region: Secondary | ICD-10-CM

## 2020-09-19 NOTE — Telephone Encounter (Signed)
She called and said Dr. Holley Raring told her to call in to get results from Margaret [name]. Please call her.

## 2020-09-19 NOTE — Telephone Encounter (Signed)
Returned patient call re; xrays done on yesterday.  Appears there are no acute findings, however I will ask Dr Holley Raring to review and if there is anything he would do differently I will call her back and let her know.  Otherwise he will discuss in depth at her next visit.  Patient is agreeable to this.

## 2020-09-19 NOTE — Telephone Encounter (Signed)
Spoke with patient re; post procedure call.  She did not have procedure on yesterday as her knee was doing well.  She did have some questions re; appt times and medication refills.  Those questions were answered and she will see [place] at her next appt.

## 2020-10-07 ENCOUNTER — Encounter: Payer: Self-pay | Admitting: *Deleted

## 2020-10-08 ENCOUNTER — Encounter: Payer: Self-pay | Admitting: Psychiatry

## 2020-10-08 ENCOUNTER — Other Ambulatory Visit: Payer: Self-pay

## 2020-10-08 ENCOUNTER — Telehealth (INDEPENDENT_AMBULATORY_CARE_PROVIDER_SITE_OTHER): Payer: Medicaid Other | Admitting: Psychiatry

## 2020-10-08 DIAGNOSIS — Z634 Disappearance and death of family member: Secondary | ICD-10-CM | POA: Diagnosis not present

## 2020-10-08 DIAGNOSIS — F5105 Insomnia due to other mental disorder: Secondary | ICD-10-CM | POA: Diagnosis not present

## 2020-10-08 DIAGNOSIS — F3131 Bipolar disorder, current episode depressed, mild: Secondary | ICD-10-CM

## 2020-10-08 MED ORDER — LAMOTRIGINE 150 MG PO TABS: 150.0000 mg | ORAL_TABLET | Freq: Two times a day (BID) | ORAL | 1 refills | Status: DC

## 2020-10-08 NOTE — Progress Notes (Signed)
Virtual Visit via Video Note  I connected with [name] on 10/08/20 at  1:00 PM EST by a video enabled telemedicine application and verified that I am speaking with the correct person using two identifiers.  Location Provider Location : [facility] Patient Location : [place]  Participants: Patient , Provider   I discussed the limitations of evaluation and management by telemedicine and the availability of in person appointments. The patient expressed understanding and agreed to proceed.   I discussed the assessment and treatment plan with the patient. The patient was provided an opportunity to ask questions and all were answered. The patient agreed with the plan and demonstrated an understanding of the instructions.   The patient was advised to call back or seek an in-person evaluation if the symptoms worsen or if the condition fails to improve as anticipated.    [facility] MD OP Progress Note  10/08/2020 1:21 PM Coumba Kellison  MRN:  [id number]  Chief Complaint:  Chief Complaint    Follow-up     HPI: Margaret [name] is a 64 year old Caucasian female on disability, divorced, lives in [place], has a history of bipolar disorder, hyperlipidemia, insomnia, diabetes melitis, chronic pain, hypertension was evaluated by telemedicine today.  Patient today reports she continues to have crying spells on and off.  Recently she was putting up her Christmas tree and had a crying episode.  She reports she had memories of her childhood, and also missed her mother.  She reports she is taking the Lamictal as prescribed.  She denies side effects.  She has started psychotherapy sessions and is motivated to stay in therapy.  She reports sleep as good.  She denies any suicidality, homicidality or perceptual disturbances.  Patient reports she has good support system from family and friends.  She is currently spending time with a friend.  She denies any other concerns today.  Visit Diagnosis:    ICD-10-CM   1.  Bipolar 1 disorder, depressed, mild (HCC)  F31.31 lamoTRIgine (LAMICTAL) 150 MG tablet  2. Bereavement  Z63.4   3. Insomnia due to mental condition  F51.05    grief, mood disorder    Past Psychiatric History: I have reviewed past psychiatric history from my progress note on 08/21/2019.  Past trials of Depakote, lithium, risperidone, Xanax  Past Medical History:  Past Medical History:  Diagnosis Date  . Bipolar 1 disorder ([place])   . Diabetes mellitus without complication (HCC)    Type 2  . Hypertension     Past Surgical History:  Procedure Laterality Date  . ABDOMINAL HYSTERECTOMY    . CHOLECYSTECTOMY    . COLONOSCOPY WITH PROPOFOL N/A 06/23/2019   Procedure: COLONOSCOPY WITH PROPOFOL;  Surgeon: Jonathon Bellows, MD;  Location: [facility] ENDOSCOPY;  Service: Gastroenterology;  Laterality: N/A;  . ESOPHAGOGASTRODUODENOSCOPY (EGD) WITH PROPOFOL N/A 06/23/2019   Procedure: ESOPHAGOGASTRODUODENOSCOPY (EGD) WITH PROPOFOL;  Surgeon: Jonathon Bellows, MD;  Location: [facility] ENDOSCOPY;  Service: Gastroenterology;  Laterality: N/A;  . TONSILLECTOMY      Family Psychiatric History: I have reviewed family psychiatric history from my progress note on 08/21/2019  Family History:  Family History  Problem Relation Age of Onset  . Mental illness Father   . Bipolar disorder Brother   . Bipolar disorder Paternal Uncle   . Bipolar disorder Paternal Uncle     Social History: Reviewed social history from my progress note on 08/21/2019 Social History   Socioeconomic History  . Marital status: Divorced    Spouse name: Not on file  .  Number of children: 2  . Years of education: Not on file  . Highest education level: Not on file  Occupational History  . Not on file  Tobacco Use  . Smoking status: Former Smoker    Types: Cigarettes    Quit date: 03/03/2011    Years since quitting: 9.6  . Smokeless tobacco: Never Used  Vaping Use  . Vaping Use: Never used  Substance and Sexual Activity  . Alcohol use: Not  Currently  . Drug use: Not Currently  . Sexual activity: Not on file  Other Topics Concern  . Not on file  Social History Narrative  . Not on file   Social Determinants of Health   Financial Resource Strain:   . Difficulty of Paying Living Expenses: Not on file  Food Insecurity:   . Worried About Charity fundraiser in the Last Year: Not on file  . Ran Out of Food in the Last Year: Not on file  Transportation Needs:   . Lack of Transportation (Medical): Not on file  . Lack of Transportation (Non-Medical): Not on file  Physical Activity:   . Days of Exercise per Week: Not on file  . Minutes of Exercise per Session: Not on file  Stress:   . Feeling of Stress : Not on file  Social Connections:   . Frequency of Communication with Friends and Family: Not on file  . Frequency of Social Gatherings with Friends and Family: Not on file  . Attends Religious Services: Not on file  . Active Member of Clubs or Organizations: Not on file  . Attends Archivist Meetings: Not on file  . Marital Status: Not on file    Allergies:  Allergies  Allergen Reactions  . Lithium Nausea And Vomiting  . Penicillins Hives    Resulted in hospitalization  . Sulfa Antibiotics Hives    Resulted in hospitalization    Metabolic Disorder Labs: Lab Results  Component Value Date   HGBA1C 7.3 (H) 06/22/2019   MPG 162.81 06/22/2019   Margaret results found for: PROLACTIN Margaret results found for: CHOL, TRIG, HDL, CHOLHDL, VLDL, LDLCALC Lab Results  Component Value Date   TSH 1.737 11/16/2019    Therapeutic Level Labs: Margaret results found for: LITHIUM Margaret results found for: VALPROATE Margaret components found for:  CBMZ  Current Medications: Current Outpatient Medications  Medication Sig Dispense Refill  . acetaminophen (TYLENOL) 500 MG tablet Take 500 mg by mouth every 6 (six) hours as needed.     Marland Kitchen alendronate (FOSAMAX) 35 MG tablet Take 35 mg by mouth every 7 (seven) days. Take with a full glass of  water on an empty stomach.    Marland Kitchen amLODipine (NORVASC) 10 MG tablet Take 10 mg by mouth daily.    . ARIPiprazole (ABILIFY) 15 MG tablet Take 0.5 tablets (7.5 mg total) by mouth daily. 45 tablet 0  . B Complex-C (B-COMPLEX WITH VITAMIN C) tablet Take 1 tablet by mouth daily. 30 tablet 2  . bisacodyl (DULCOLAX) 5 MG EC tablet Take 5 mg by mouth daily as needed for moderate constipation.    . bisoprolol-hydrochlorothiazide (ZIAC) 5-6.25 MG tablet Take 1 tablet by mouth daily.    . cetirizine (ZYRTEC) 10 MG tablet Take 10 mg by mouth daily.    . clindamycin (CLEOCIN) 150 MG capsule TAKE 4 CAPSULES 1 HOURABEFORE DENTAL APPOINTMENT.    . cloNIDine (CATAPRES) 0.1 MG tablet clonidine HCl 0.1 mg tablet  TAKE 1 TABLET BY MOUTH 2 TIMES  A DAY    . Dulaglutide (TRULICITY) 1.66 AY/3.0ZS SOPN Inject 0.75 mg into the skin once a week. Thursday    . Ertugliflozin L-PyroglutamicAc (STEGLATRO) 15 MG TABS Take by mouth.     . ferrous sulfate 325 (65 FE) MG tablet Take 1 tablet (325 mg total) by mouth 2 (two) times daily with a meal. 120 tablet 0  . furosemide (LASIX) 20 MG tablet Take 10 mg by mouth daily as needed.    . gabapentin (NEURONTIN) 600 MG tablet TAKE (1) TABLET BY MOUTH (3) TIMES DAILY. 270 tablet 0  . glimepiride (AMARYL) 2 MG tablet Take 2 mg by mouth daily with breakfast.     . hydrochlorothiazide (HYDRODIURIL) 25 MG tablet Take 25 mg by mouth daily.     Derrill Memo ON 10/09/2020] HYDROcodone-acetaminophen (NORCO) 10-325 MG tablet Take 1 tablet by mouth 3 (three) times daily. For chronic pain syndrome. To last for 30 days from fill date. 90 tablet 0  . [START ON 11/08/2020] HYDROcodone-acetaminophen (NORCO) 10-325 MG tablet Take 1 tablet by mouth 3 (three) times daily. For chronic pain syndrome. To last for 30 days from fill date. 90 tablet 0  . Insulin Glargine (LANTUS SOLOSTAR) 100 UNIT/ML Solostar Pen Inject 60 Units into the skin at bedtime.     . lamoTRIgine (LAMICTAL) 150 MG tablet Take 1 tablet (150 mg  total) by mouth 2 (two) times daily. 60 tablet 1  . LANTUS SOLOSTAR 100 UNIT/ML Solostar Pen SMARTSIG:60 Unit(s) SUB-Q Every Night    . losartan (COZAAR) 25 MG tablet Take 25 mg by mouth daily. Takes differently    . losartan (COZAAR) 50 MG tablet Take 50 mg by mouth daily.    . meclizine (ANTIVERT) 25 MG tablet Take 25 mg by mouth 2 (two) times daily as needed.    . meloxicam (MOBIC) 7.5 MG tablet TAKE 1 TABLET BY MOUTH EVERY DAY    . metFORMIN (GLUCOPHAGE) 500 MG tablet Take 500 mg by mouth 2 (two) times daily with a meal.    . pantoprazole (PROTONIX) 40 MG tablet Take 40 mg by mouth daily.    Marland Kitchen spironolactone (ALDACTONE) 25 MG tablet Take 25 mg by mouth daily.     Margaret current facility-administered medications for this visit.     Musculoskeletal: Strength & Muscle Tone: UTA Gait & Station: UTA Patient leans: N/A  Psychiatric Specialty Exam: Review of Systems  Psychiatric/Behavioral: Positive for dysphoric mood.  All other systems reviewed and are negative.   There were Margaret vitals taken for this visit.There is Margaret height or weight on file to calculate BMI.  General Appearance: Casual  Eye Contact:  Fair  Speech:  Clear and Coherent  Volume:  Normal  Mood:  Depressed  Affect:  Appropriate  Thought Process:  Goal Directed and Descriptions of Associations: Intact  Orientation:  Full (Time, Place, and Person)  Thought Content: Logical   Suicidal Thoughts:  Margaret  Homicidal Thoughts:  Margaret  Memory:  Immediate;   Fair Recent;   Fair Remote;   Fair  Judgement:  Fair  Insight:  Fair  Psychomotor Activity:  Normal  Concentration:  Concentration: Fair and Attention Span: Fair  Recall:  [employer] of Knowledge: Fair  Language: Fair  Akathisia:  Margaret  Handed:  Right  AIMS (if indicated): UTA  Assets:  Communication Skills Desire for Improvement Housing Social Support  ADL's:  Intact  Cognition: WNL  Sleep:  Fair   Screenings: PHQ2-9     Procedure  visit from 09/18/2020 in  [facility] Procedure visit from 08/28/2020 in [facility]  PHQ-2 Total Score 0 0       Assessment and Plan: Kemba Hoppes is a 64 year old Caucasian female on disability, divorced, currently lives in [place], has a history of bipolar disorder, chronic pain, insomnia, diabetes melitis, hypertension, hyperlipidemia was evaluated by telemedicine today.  Patient is biologically predisposed given her family history, history of trauma.  Patient with psychosocial stressors of mother's death, current pandemic, the holidays, daughter's relocation.  Patient continues to struggle with mood swings and will benefit from medication readjustment and continued psychotherapy sessions.  Plan as noted below.  Plan Bipolar disorder type I depressed, mild-some progress Increase lamotrigine to 150 mg p.o. twice daily. Abilify 7.5 mg p.o. daily Gabapentin 600 mg p.o. 3 times daily  Insomnia-stable We will monitor closely  Continue CBT with Ms. Zadie Rhine.  Follow-up in clinic in 1 month or sooner if needed.  I have spent atleast 20 minutes face to face by video with patient today. More than 50 % of the time was spent for preparing to see the patient ( e.g., review of test, records ), ordering medications and test ,psychoeducation and supportive psychotherapy and care coordination,as well as documenting clinical information in electronic health record.  This note was generated in part or whole with voice recognition software. Voice recognition is usually quite accurate but there are transcription errors that can and very often do occur. I apologize for any typographical errors that were not detected and corrected.        Ursula Alert, MD 10/08/2020, 1:21 PM

## 2020-10-15 ENCOUNTER — Other Ambulatory Visit: Payer: Self-pay

## 2020-10-15 ENCOUNTER — Ambulatory Visit (INDEPENDENT_AMBULATORY_CARE_PROVIDER_SITE_OTHER): Payer: Medicaid Other | Admitting: Licensed Clinical Social Worker

## 2020-10-15 ENCOUNTER — Telehealth: Payer: Self-pay | Admitting: Student in an Organized Health Care Education/Training Program

## 2020-10-15 ENCOUNTER — Encounter: Payer: Self-pay | Admitting: Licensed Clinical Social Worker

## 2020-10-15 DIAGNOSIS — Z634 Disappearance and death of family member: Secondary | ICD-10-CM

## 2020-10-15 DIAGNOSIS — F3131 Bipolar disorder, current episode depressed, mild: Secondary | ICD-10-CM

## 2020-10-15 NOTE — Telephone Encounter (Signed)
Patient would like to know what medication was injected in her knee

## 2020-10-15 NOTE — Progress Notes (Signed)
Virtual Visit via Video Note  I connected with Margaret [name] on 10/15/20 at 11:00 AM EST by a video enabled telemedicine application and verified that I am speaking with the correct person using two identifiers.  Participating Parties Patient Provider  Location: Patient: Home Provider: Home Office   I discussed the limitations of evaluation and management by telemedicine and the availability of in person appointments. The patient expressed understanding and agreed to proceed.  THERAPY PROGRESS NOTE  Session Time: 32 Minutes  Participation Level: Active  Behavioral Response: Well GroomedAlertDepressed  Type of Therapy: Individual Therapy  Treatment Goals addressed: Coping  Interventions: CBT  Summary: Margaret [name] is a 64 y.o. female who presents with depression sxs. Pt reported feeling better today after recovering from tooth infection and enjoyed going out of town with her friends for several days during the Thanksgiving holiday. Pt reported she often feels like she has to be "the strong one" and was brought up to push away feelings like sadness because it was a sign of "weakness" in her family. Pt reported difficulty keeping in her sadness over the loss of her mother and often finds herself pushing through to avoid crying, however tearfulness is commonly experienced several times throughout the week. Pt reported she often copes by being with her supports, keeping busy and laughter. Pt is also adjusting to physical limitations requiring less driving and use of walker with one fall since last session.   Suicidal/Homicidal: No  Therapist Response: Therapist met with patient for follow up after completing CCA. Therapist and patient reviewed treatment plan and goals. Pt in agreement. Therapist and patient explored current stressors and grief/loss issues as well as attempts to cope. Therapist provided psychoeducation on stages of grief and normalized patient reactions to mother's death.  Therapist supported patient's efforts to cope and provided feedback. Pt was receptive.  Plan: Return again in 1 month.  Diagnosis: Axis I: Bipolar, Depressed and Bereavement    Axis II: N/A  Josephine Igo, LCSW, LCAS 10/15/2020

## 2020-10-15 NOTE — Telephone Encounter (Signed)
[place]. Patient called and informed.

## 2020-10-29 ENCOUNTER — Encounter (HOSPITAL_COMMUNITY): Payer: Self-pay | Admitting: Physical Therapy

## 2020-10-29 ENCOUNTER — Ambulatory Visit (HOSPITAL_COMMUNITY)
Payer: Medicaid Other | Attending: Student in an Organized Health Care Education/Training Program | Admitting: Physical Therapy

## 2020-10-29 ENCOUNTER — Other Ambulatory Visit: Payer: Self-pay

## 2020-10-29 DIAGNOSIS — M542 Cervicalgia: Secondary | ICD-10-CM | POA: Diagnosis present

## 2020-10-29 DIAGNOSIS — R29898 Other symptoms and signs involving the musculoskeletal system: Secondary | ICD-10-CM | POA: Diagnosis present

## 2020-10-29 DIAGNOSIS — R2689 Other abnormalities of gait and mobility: Secondary | ICD-10-CM | POA: Diagnosis present

## 2020-10-29 NOTE — Therapy (Signed)
[facility] [address] [place], [place], [address] Phone: [phone]   Fax:  [phone]  Physical Therapy Evaluation  Patient Details  Name: Margaret [name] MRN: [id number] Date of Birth: 1956-03-27 Referring Provider (PT): Gillis Santa MD   Encounter Date: 10/29/2020   PT End of Session - 10/29/20 1615    Visit Number 1    Number of Visits 8    Date for PT Re-Evaluation 12/03/20    Authorization Type [employer]    Authorization Time Period 3 visits requested 11/02/20- 2/1/22check auth, will need to request more per POC    Authorization - Visit Number 0    Authorization - Number of Visits 3    PT Start Time 1535    PT Stop Time 1614    PT Time Calculation (min) 39 min    Activity Tolerance Patient tolerated treatment well    Behavior During Therapy [facility] for tasks assessed/performed           Past Medical History:  Diagnosis Date   Bipolar 1 disorder ([place])    Diabetes mellitus without complication ([place])    Type 2   Hypertension     Past Surgical History:  Procedure Laterality Date   ABDOMINAL HYSTERECTOMY     CHOLECYSTECTOMY     COLONOSCOPY WITH PROPOFOL N/A 06/23/2019   Procedure: COLONOSCOPY WITH PROPOFOL;  Surgeon: Jonathon Bellows, MD;  Location[facility] ENDOSCOPY;  Service: Gastroenterology;  Laterality: N/A;   ESOPHAGOGASTRODUODENOSCOPY (EGD) WITH PROPOFOL N/A 06/23/2019   Procedure: ESOPHAGOGASTRODUODENOSCOPY (EGD) WITH PROPOFOL;  Surgeon: Jonathon Bellows, MD;  Location: [facility] ENDOSCOPY;  Service: Gastroenterology;  Laterality: N/A;   TONSILLECTOMY      There were no vitals filed for this visit.    Subjective Assessment - 10/29/20 1534    Subjective Patient is a 64 y.o. female who presents to physical therapy with c/o cervical radicular pain. Patient states she has a knot in her neck. She once had a knot in her neck and they gave her a shot and it went away. Another year she had shoulder pain and they gave her a shot  and that went away. She has aching in her neck on the one side in the right upper trap. She feels tension in her shoulder muscles. She has trouble withy lifting and pulling. Patient states her main goal is to improve R shoulder pain.    Limitations Lifting;House hold activities    Patient Stated Goals improve R shoulder/ Neck pain    Currently in Pain? Yes    Pain Score 3     Pain Location Neck    Pain Orientation Right    Pain Descriptors / Indicators Aching              [facility] PT Assessment - 10/29/20 0001      Assessment   Medical Diagnosis Cervical radicular pain    Referring Provider (PT) Gillis Santa MD    Onset Date/Surgical Date 10/30/14    Next MD Visit Nov 28, 2020    Prior Therapy none      Precautions   Precautions None      Restrictions   Weight Bearing Restrictions No      Balance Screen   Has the patient fallen in the past 6 months Yes    How many times? 2    Has the patient had a decrease in activity level because of a fear of falling?  No    Is the patient reluctant  to leave their home because of a fear of falling?  No      Prior Function   Level of Independence Independent    Vocation On disability      Cognition   Overall Cognitive Status Within Functional Limits for tasks assessed      Observation/Other Assessments   Observations Ambulates with RW    Focus on Therapeutic Outcomes (FOTO)  n/a      Sensation   Light Touch Appears Intact      ROM / Strength   AROM / PROM / Strength AROM;Strength      AROM   AROM Assessment Site Cervical    Cervical Flexion 0% limited, concordant pain from neck to shoulder    Cervical Extension 25% limited    Cervical - Right Side Bend 0% limited    Cervical - Left Side Bend 50% limited, pulling in UT    Cervical - Right Rotation 0% limited    Cervical - Left Rotation 0% limited      Strength   Strength Assessment Site Shoulder;Wrist;Hand;Elbow    Right/Left Shoulder Right;Left    Right Shoulder ABduction  4-/5   pain in shoulder and UT   Left Shoulder ABduction 4+/5    Right/Left Elbow Right;Left    Right Elbow Flexion 5/5    Right Elbow Extension 5/5    Left Elbow Flexion 5/5    Left Elbow Extension 5/5    Right/Left Wrist Right;Left    Right Wrist Flexion 5/5    Right Wrist Extension 5/5    Left Wrist Flexion 5/5    Left Wrist Extension 5/5    Right/Left hand Right;Left    Right Hand Gross Grasp Functional    Left Hand Gross Grasp Functional      Palpation   Spinal mobility Hypomobile and tender throughout c/sp R>L    Palpation comment TTP R cervical paraspinals, levator scapulae, UT      Special Tests   Other special tests Traction decrease radicular symptoms.                      Objective measurements completed on examination: See above findings.               PT Education - 10/29/20 1534    Education Details Patient educated on exam findings, POC, scope of PT    Person(s) Educated Patient    Methods Explanation;Demonstration    Comprehension Verbalized understanding;Returned demonstration            PT Short Term Goals - 10/29/20 1629      PT SHORT TERM GOAL #1   Title Patient will be independent with HEP in order to improve functional outcomes.    Time 2    Period Weeks    Status New    Target Date 11/19/20      PT SHORT TERM GOAL #2   Title Patient will report at least 25% improvement in symptoms for improved quality of life.    Time 2    Period Weeks    Status New    Target Date 11/12/20             PT Long Term Goals - 10/29/20 1630      PT LONG TERM GOAL #1   Title Patient will report at least 75% improvement in symptoms for improved quality of life.    Time 4    Period Weeks    Status New  Target Date 11/26/20      PT LONG TERM GOAL #2   Title Patient will demonstrate at least 25% improvement in cervical ROM in all restricted planes for imporved mobility to move neck while at home.    Time 4    Period Weeks     Status New    Target Date 11/26/20                  Plan - 10/29/20 1622    Clinical Impression Statement Patient is a 64 y.o. female who presents to physical therapy with c/o cervical radicular pain. She presents with pain limited deficits in cervical spine and R shoulder strength, ROM, postural impairments, spinal mobility, hyperactive and tender musculature, and functional mobility with ADL. She is having to modify and restrict ADL as indicated by subjective information and objective measures which is affecting overall participation. Patient will benefit from skilled physical therapy in order to improve function and reduce impairment.    Personal Factors and Comorbidities Age;Fitness;Past/Current Experience;Comorbidity 3+;Time since onset of injury/illness/exacerbation    Comorbidities bipolar, DM, HTN    Examination-Activity Limitations Bend;Carry;Lift    Examination-Participation Restrictions Meal Prep;Laundry;Cleaning    Stability/Clinical Decision Making Stable/Uncomplicated    Clinical Decision Making Low    Rehab Potential Good    PT Frequency 2x / week    PT Duration 4 weeks   beginning in Jan.   PT Treatment/Interventions ADLs/Self Care Home Management;Cryotherapy;Electrical Stimulation;Moist Heat;Traction;Ultrasound;Stair training;DME Instruction;Functional mobility training;Gait training;Therapeutic activities;Therapeutic exercise;Balance training;Neuromuscular re-education;Patient/family education;Manual techniques;Dry needling;Energy conservation;Joint Manipulations;Spinal Manipulations;Vestibular;Passive range of motion    PT Next Visit Plan begin manual for pain/mobility, postural strengthening, cervical spine mobility    PT Home Exercise Plan initiate next session    Consulted and Agree with Plan of Care Patient           Patient will benefit from skilled therapeutic intervention in order to improve the following deficits and impairments:  Decreased range of  motion,Decreased activity tolerance,Pain,Hypomobility,Decreased mobility,Decreased strength,Postural dysfunction  Visit Diagnosis: Cervicalgia  Other abnormalities of gait and mobility  Other symptoms and signs involving the musculoskeletal system     Problem List Patient Active Problem List   Diagnosis Date Noted   Bipolar 1 disorder, depressed, mild ([place]) 10/08/2020   Neck pain on right side 09/18/2020   Pain in joint of right shoulder 09/18/2020   Radicular pain of left lower extremity 07/30/2020   Pain management contract signed 07/30/2020   Lumbar facet arthropathy 07/30/2020   Low back pain 06/27/2020   Joint pain 06/27/2020   Disorder of jaw 06/27/2020   Abnormal involuntary movement 06/27/2020   Chronic pain syndrome 06/20/2020   Encounter for long-term opiate analgesic use 06/20/2020   Bilateral primary osteoarthritis of knee 06/20/2020   Sacroiliac joint pain 06/20/2020   History of left hip replacement 06/20/2020   Steatosis of liver 05/06/2020   Recurrent major depressive episodes, moderate ([place]) 05/06/2020   Osteoporosis 05/06/2020   Hyperlipidemia 05/06/2020   Gastroesophageal reflux disease without esophagitis 05/06/2020   Bereavement 01/11/2020   Anxiety disorder 11/14/2019   Insomnia due to mental condition 11/14/2019   Bipolar disorder, in full remission, most recent episode manic ([place]) 08/21/2019   At risk for long QT syndrome 08/21/2019   Insomnia 08/21/2019   Iron deficiency anemia due to chronic blood loss 07/04/2019   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H (nonalcoholic steatohepatitis) ([place]) 07/04/2019   Tubular adenoma 07/04/2019   Anemia 06/21/2019   HTN (hypertension) 06/21/2019   Diabetes ([place])  06/21/2019   CKD (chronic kidney disease), stage III (HCC) 06/21/2019   Bipolar disorder, in partial remission, most recent episode mixed (HCC) 06/21/2019   Controlled type 2 diabetes mellitus without complication, with  long-term current use of insulin (HCC) 06/21/2019   Generalized osteoarthritis 06/21/2019   Encounter for screening colonoscopy 07/19/2018   4:35 PM, 10/29/20 Wyman Songster PT, DPT Physical Therapist at [facility]   [facility] [address] [place], [place], [address] Phone: [phone]   Fax:  [phone]  Name: Charitie Hinote MRN: [id number] Date of Birth: February 23, 1956

## 2020-11-06 ENCOUNTER — Other Ambulatory Visit: Payer: Self-pay

## 2020-11-06 ENCOUNTER — Telehealth (INDEPENDENT_AMBULATORY_CARE_PROVIDER_SITE_OTHER): Payer: Medicaid Other | Admitting: Psychiatry

## 2020-11-06 ENCOUNTER — Encounter (HOSPITAL_COMMUNITY): Payer: Self-pay | Admitting: Physical Therapy

## 2020-11-06 ENCOUNTER — Ambulatory Visit (HOSPITAL_COMMUNITY)
Payer: Medicaid Other | Attending: Student in an Organized Health Care Education/Training Program | Admitting: Physical Therapy

## 2020-11-06 DIAGNOSIS — M542 Cervicalgia: Secondary | ICD-10-CM | POA: Insufficient documentation

## 2020-11-06 DIAGNOSIS — Z634 Disappearance and death of family member: Secondary | ICD-10-CM

## 2020-11-06 DIAGNOSIS — R2689 Other abnormalities of gait and mobility: Secondary | ICD-10-CM | POA: Insufficient documentation

## 2020-11-06 DIAGNOSIS — G479 Sleep disorder, unspecified: Secondary | ICD-10-CM | POA: Diagnosis not present

## 2020-11-06 DIAGNOSIS — F3131 Bipolar disorder, current episode depressed, mild: Secondary | ICD-10-CM

## 2020-11-06 DIAGNOSIS — F410 Panic disorder [episodic paroxysmal anxiety] without agoraphobia: Secondary | ICD-10-CM

## 2020-11-06 DIAGNOSIS — R29898 Other symptoms and signs involving the musculoskeletal system: Secondary | ICD-10-CM | POA: Diagnosis present

## 2020-11-06 MED ORDER — HYDROXYZINE PAMOATE 25 MG PO CAPS: 25.0000 mg | ORAL_CAPSULE | Freq: Every day | ORAL | 1 refills | Status: DC | PRN

## 2020-11-06 MED ORDER — ESCITALOPRAM OXALATE 5 MG PO TABS: 5.0000 mg | ORAL_TABLET | Freq: Every day | ORAL | 1 refills | Status: DC

## 2020-11-06 MED ORDER — GABAPENTIN 600 MG PO TABS: ORAL_TABLET | ORAL | 0 refills | Status: DC

## 2020-11-06 NOTE — Therapy (Signed)
[place], [place], [address] Phone: [phone]   Fax:  [phone]  Physical Therapy Treatment  Patient Details  Name: Margaret [name] MRN: [id number] Date of Birth: [birth year] Referring Provider (PT): Gillis Santa MD   Encounter Date: 11/06/2020   PT End of Session - 11/06/20 1315    Visit Number 2    Number of Visits 8    Date for PT Re-Evaluation 12/03/20    Authorization Type [employer]    Authorization Time Period 3 visits requested 11/02/20- 2/1/22check auth, will need to request more per POC    Authorization - Visit Number 1    Authorization - Number of Visits 3    PT Start Time [id number]    PT Stop Time 1355    PT Time Calculation (min) 40 min    Activity Tolerance Patient tolerated treatment well    Behavior During Therapy [facility] for tasks assessed/performed           Past Medical History:  Diagnosis Date  . Bipolar 1 disorder ([place])   . Diabetes mellitus without complication (HCC)    Type 2  . Hypertension     Past Surgical History:  Procedure Laterality Date  . ABDOMINAL HYSTERECTOMY    . CHOLECYSTECTOMY    . COLONOSCOPY WITH PROPOFOL N/A 06/23/2019   Procedure: COLONOSCOPY WITH PROPOFOL;  Surgeon: Jonathon Bellows, MD;  Location[facility] ENDOSCOPY;  Service: Gastroenterology;  Laterality: N/A;  . ESOPHAGOGASTRODUODENOSCOPY (EGD) WITH PROPOFOL N/A 06/23/2019   Procedure: ESOPHAGOGASTRODUODENOSCOPY (EGD) WITH PROPOFOL;  Surgeon: Jonathon Bellows, MD;  Location: [facility] ENDOSCOPY;  Service: Gastroenterology;  Laterality: N/A;  . TONSILLECTOMY      There were no vitals filed for this visit.   Subjective Assessment - 11/06/20 1314    Subjective Patient states she was sore after the evaluation which lasted about half a day.    Limitations Lifting;House hold activities    Patient Stated Goals improve R shoulder/ Neck pain    Currently in Pain? No/denies                             [facility] Adult PT  Treatment/Exercise - 11/06/20 0001      Exercises   Exercises Neck      Neck Exercises: Seated   Other Seated Exercise UT stretch 3x 20 seconds bilateral    Other Seated Exercise scap retraction 2x15; t/sp ext over chair 10x 5 second      Neck Exercises: Supine   Neck Retraction 10 reps;3 secs    Neck Retraction Limitations 2 sets      Manual Therapy   Manual Therapy Soft tissue mobilization    Manual therapy comments completed independently from all other aspects of treatment    Soft tissue mobilization R UT, levator scapulae                  PT Education - 11/06/20 1314    Education Details Patient educated on HEP, exercise mechanics    Person(s) Educated Patient    Methods Explanation;Demonstration    Comprehension Verbalized understanding;Returned demonstration            PT Short Term Goals - 10/29/20 1629      PT SHORT TERM GOAL #1   Title Patient will be independent with HEP in order to improve functional outcomes.    Time 2    Period Weeks    Status New  Target Date 11/19/20      PT SHORT TERM GOAL #2   Title Patient will report at least 25% improvement in symptoms for improved quality of life.    Time 2    Period Weeks    Status New    Target Date 11/12/20             PT Long Term Goals - 10/29/20 1630      PT LONG TERM GOAL #1   Title Patient will report at least 75% improvement in symptoms for improved quality of life.    Time 4    Period Weeks    Status New    Target Date 11/26/20      PT LONG TERM GOAL #2   Title Patient will demonstrate at least 25% improvement in cervical ROM in all restricted planes for imporved mobility to move neck while at home.    Time 4    Period Weeks    Status New    Target Date 11/26/20                 Plan - 11/06/20 1316    Clinical Impression Statement Patient with tender and hyperactive R UT and levator scapulae. Patient tolerates manual therapy well with decrease in tissue tension  following soft tissue mobilization. Patient feels reduction in symptoms following manual therapy intervention. Patient requires min verbal and tactile cueing for supine retractions with good carry over with mechanics. Patient initially has difficulty performing scap retractions in seated but is able to perform well following further instruction and cueing. Patient will continue to benefit from skilled physical therapy in order to reduce impairment and improve function.    Personal Factors and Comorbidities Age;Fitness;Past/Current Experience;Comorbidity 3+;Time since onset of injury/illness/exacerbation    Comorbidities bipolar, DM, HTN    Examination-Activity Limitations Bend;Carry;Lift    Examination-Participation Restrictions Meal Prep;Laundry;Cleaning    Stability/Clinical Decision Making Stable/Uncomplicated    Rehab Potential Good    PT Frequency 2x / week    PT Duration 4 weeks   beginning in Jan.   PT Treatment/Interventions ADLs/Self Care Home Management;Cryotherapy;Electrical Stimulation;Moist Heat;Traction;Ultrasound;Stair training;DME Instruction;Functional mobility training;Gait training;Therapeutic activities;Therapeutic exercise;Balance training;Neuromuscular re-education;Patient/family education;Manual techniques;Dry needling;Energy conservation;Joint Manipulations;Spinal Manipulations;Vestibular;Passive range of motion    PT Next Visit Plan continue manual for pain/mobility, postural strengthening, cervical spine mobility    PT Home Exercise Plan 1/4 cervical retraction in supine, scap retraction in seated, UT stretch    Consulted and Agree with Plan of Care Patient           Patient will benefit from skilled therapeutic intervention in order to improve the following deficits and impairments:  Decreased range of motion,Decreased activity tolerance,Pain,Hypomobility,Decreased mobility,Decreased strength,Postural dysfunction  Visit Diagnosis: Cervicalgia  Other abnormalities of  gait and mobility  Other symptoms and signs involving the musculoskeletal system     Problem List Patient Active Problem List   Diagnosis Date Noted  . Panic attacks 11/06/2020  . Bipolar 1 disorder, depressed, mild (HCC) 10/08/2020  . Neck pain on right side 09/18/2020  . Pain in joint of right shoulder 09/18/2020  . Radicular pain of left lower extremity 07/30/2020  . Pain management contract signed 07/30/2020  . Lumbar facet arthropathy 07/30/2020  . Low back pain 06/27/2020  . Joint pain 06/27/2020  . Disorder of jaw 06/27/2020  . Abnormal involuntary movement 06/27/2020  . Chronic pain syndrome 06/20/2020  . Encounter for long-term opiate analgesic use 06/20/2020  . Bilateral primary osteoarthritis of knee 06/20/2020  .  Sacroiliac joint pain 06/20/2020  . History of left hip replacement 06/20/2020  . Steatosis of liver 05/06/2020  . Recurrent major depressive episodes, moderate ([place]) 05/06/2020  . Osteoporosis 05/06/2020  . Hyperlipidemia 05/06/2020  . Gastroesophageal reflux disease without esophagitis 05/06/2020  . Bereavement 01/11/2020  . Anxiety disorder 11/14/2019  . Insomnia due to mental condition 11/14/2019  . Bipolar disorder, in full remission, most recent episode manic ([place]) 08/21/2019  . At risk for long QT syndrome 08/21/2019  . Insomnia 08/21/2019  . Iron deficiency anemia due to chronic blood loss 07/04/2019  . 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H (nonalcoholic steatohepatitis) ([place]) 07/04/2019  . Tubular adenoma 07/04/2019  . Anemia 06/21/2019  . HTN (hypertension) 06/21/2019  . Diabetes ([place]) 06/21/2019  . CKD (chronic kidney disease), stage III ([place]) 06/21/2019  . Bipolar disorder, in partial remission, most recent episode mixed ([place]) 06/21/2019  . Controlled type 2 diabetes mellitus without complication, with long-term current use of insulin ([place]) 06/21/2019  . Generalized osteoarthritis 06/21/2019  . Encounter for screening colonoscopy 07/19/2018     1:55 PM, 11/06/20 Mearl Latin PT, DPT Physical Therapist at [place], [place], [address] Phone: [phone]   Fax:  [phone]  Name: Margaret [name] MRN: [id number] Date of Birth: [birth year]

## 2020-11-06 NOTE — Progress Notes (Signed)
Virtual Visit via Video Note  I connected with Margaret [name] on 11/06/20 at 11:30 AM EST by a video enabled telemedicine application and verified that I am speaking with the correct person using two identifiers.  Location Provider Location : [facility] Patient Location : Home  Participants: Patient , Provider    I discussed the limitations of evaluation and management by telemedicine and the availability of in person appointments. The patient expressed understanding and agreed to proceed.    I discussed the assessment and treatment plan with the patient. The patient was provided an opportunity to ask questions and all were answered. The patient agreed with the plan and demonstrated an understanding of the instructions.   The patient was advised to call back or seek an in-person evaluation if the symptoms worsen or if the condition fails to improve as anticipated.   BH MD OP Progress Note  11/06/2020 12:48 PM Margaret [name]  MRN:  [id number]  Chief Complaint:  Chief Complaint    Follow-up     HPI: Margaret [name] is a 65 year old Caucasian female on disability, divorced, lives in [place], has a history of bipolar disorder, hyperlipidemia, insomnia, diabetes melitis, chronic pain, hypertension was evaluated by telemedicine today.  Patient today reports she is currently struggling with panic attacks.  She reports she has had anxiety attacks all her life however recently she had an episode when she could not get out of her house.  She reports she went into a panic mode.  She reports when she has these panic symptoms she has crying spells, racing thoughts and is unable to function.  She reports her son was able to talk her down and that did help.  She has tried medications in the past for panic symptoms however she does not remember the names.  She continues to be very emotional and reports she cries easily.  She however does not believe she is depressed.  She does report feeling tired, having low  energy during the day and easily dozing off.  She snores at night.  She reports if she remains inactive she dozes off quickly.  She hence has to make sure she keeps talking to someone to keep herself awake.  This has been getting worse.  She has never had a sleep study done before.  She is compliant on her medications and denies side effects.  She reports she is interested in getting her COVID-19 vaccine however wants to know more about it before she makes that decision.  Patient denies any suicidality, homicidality or perceptual disturbances.  Patient denies any other concerns today.  Visit Diagnosis:    ICD-10-CM   1. Bipolar 1 disorder, depressed, mild (HCC)  F31.31   2. Bereavement  Z63.4 gabapentin (NEURONTIN) 600 MG tablet  3. Sleep disorder  G47.9    insomnia and hypersomnia  4. Panic attacks  F41.0 escitalopram (LEXAPRO) 5 MG tablet    hydrOXYzine (VISTARIL) 25 MG capsule    Past Psychiatric History: I have reviewed past psychiatric history from my progress note on 08/21/2019.  Past trials of Depakote, lithium, risperidone, Xanax  Past Medical History:  Past Medical History:  Diagnosis Date  . Bipolar 1 disorder (HCC)   . Diabetes mellitus without complication (HCC)    Type 2  . Hypertension     Past Surgical History:  Procedure Laterality Date  . ABDOMINAL HYSTERECTOMY    . CHOLECYSTECTOMY    . COLONOSCOPY WITH PROPOFOL N/A 06/23/2019   Procedure: COLONOSCOPY WITH PROPOFOL;  Surgeon: Tobi Bastos,  Bailey Mech, MD;  Location: [facility];  Service: Gastroenterology;  Laterality: N/A;  . ESOPHAGOGASTRODUODENOSCOPY (EGD) WITH PROPOFOL N/A 06/23/2019   Procedure: ESOPHAGOGASTRODUODENOSCOPY (EGD) WITH PROPOFOL;  Surgeon: Jonathon Bellows, MD;  Location[facility] ENDOSCOPY;  Service: Gastroenterology;  Laterality: N/A;  . TONSILLECTOMY      Family Psychiatric History: I have reviewed family psychiatric history from my progress note on 08/21/2019  Family History:  Family History  Problem  Relation Age of Onset  . Mental illness Father   . Bipolar disorder Brother   . Bipolar disorder Paternal Uncle   . Bipolar disorder Paternal Uncle     Social History: Reviewed social history from my progress note on 08/21/2019 Social History   Socioeconomic History  . Marital status: Divorced    Spouse name: Not on file  . Number of children: 2  . Years of education: Not on file  . Highest education level: Not on file  Occupational History  . Not on file  Tobacco Use  . Smoking status: Former Smoker    Types: Cigarettes    Quit date: 03/03/2011    Years since quitting: 9.6  . Smokeless tobacco: Never Used  Vaping Use  . Vaping Use: Never used  Substance and Sexual Activity  . Alcohol use: Not Currently  . Drug use: Not Currently  . Sexual activity: Not on file  Other Topics Concern  . Not on file  Social History Narrative  . Not on file   Social Determinants of Health   Financial Resource Strain: Not on file  Food Insecurity: Not on file  Transportation Needs: Not on file  Physical Activity: Not on file  Stress: Not on file  Social Connections: Not on file    Allergies:  Allergies  Allergen Reactions  . Lithium Nausea And Vomiting  . Penicillins Hives    Resulted in hospitalization  . Sulfa Antibiotics Hives    Resulted in hospitalization    Metabolic Disorder Labs: Lab Results  Component Value Date   HGBA1C 7.3 (H) 06/22/2019   MPG 162.81 06/22/2019   No results found for: PROLACTIN No results found for: CHOL, TRIG, HDL, CHOLHDL, VLDL, LDLCALC Lab Results  Component Value Date   TSH 1.737 11/16/2019    Therapeutic Level Labs: No results found for: LITHIUM No results found for: VALPROATE No components found for:  CBMZ  Current Medications: Current Outpatient Medications  Medication Sig Dispense Refill  . escitalopram (LEXAPRO) 5 MG tablet Take 1 tablet (5 mg total) by mouth daily with supper. 30 tablet 1  . hydrOXYzine (VISTARIL) 25 MG  capsule Take 1-2 capsules (25-50 mg total) by mouth daily as needed for anxiety. Limit use - take only for severe panic attacks 60 capsule 1  . acetaminophen (TYLENOL) 500 MG tablet Take 500 mg by mouth every 6 (six) hours as needed.     Marland Kitchen alendronate (FOSAMAX) 35 MG tablet Take 35 mg by mouth every 7 (seven) days. Take with a full glass of water on an empty stomach.    Marland Kitchen amLODipine (NORVASC) 10 MG tablet Take 10 mg by mouth daily.    . ARIPiprazole (ABILIFY) 15 MG tablet Take 0.5 tablets (7.5 mg total) by mouth daily. 45 tablet 0  . B Complex-C (B-COMPLEX WITH VITAMIN C) tablet Take 1 tablet by mouth daily. 30 tablet 2  . bisacodyl (DULCOLAX) 5 MG EC tablet Take 5 mg by mouth daily as needed for moderate constipation.    . bisoprolol-hydrochlorothiazide (ZIAC) 5-6.25 MG tablet Take 1  tablet by mouth daily.    . cetirizine (ZYRTEC) 10 MG tablet Take 10 mg by mouth daily.    . clindamycin (CLEOCIN) 150 MG capsule TAKE 4 CAPSULES 1 HOURABEFORE DENTAL APPOINTMENT.    . cloNIDine (CATAPRES) 0.1 MG tablet clonidine HCl 0.1 mg tablet  TAKE 1 TABLET BY MOUTH 2 TIMES A DAY    . Dulaglutide (TRULICITY) [id number] 0000000 SOPN Inject 0.75 mg into the skin once a week. Thursday    . Ertugliflozin L-PyroglutamicAc (STEGLATRO) 15 MG TABS Take by mouth.     . ferrous sulfate 325 (65 FE) MG tablet Take 1 tablet (325 mg total) by mouth 2 (two) times daily with a meal. 120 tablet 0  . furosemide (LASIX) 20 MG tablet Take 10 mg by mouth daily as needed.    . gabapentin (NEURONTIN) 600 MG tablet TAKE (1) TABLET BY MOUTH (3) TIMES DAILY. 270 tablet 0  . glimepiride (AMARYL) 2 MG tablet Take 2 mg by mouth daily with breakfast.     . hydrochlorothiazide (HYDRODIURIL) 25 MG tablet Take 25 mg by mouth daily.     Marland Kitchen HYDROcodone-acetaminophen (NORCO) 10-325 MG tablet Take 1 tablet by mouth 3 (three) times daily. For chronic pain syndrome. To last for 30 days from fill date. 90 tablet 0  . [START ON 11/08/2020]  HYDROcodone-acetaminophen (NORCO) 10-325 MG tablet Take 1 tablet by mouth 3 (three) times daily. For chronic pain syndrome. To last for 30 days from fill date. 90 tablet 0  . Insulin Glargine (LANTUS SOLOSTAR) 100 UNIT/ML Solostar Pen Inject 60 Units into the skin at bedtime.     . lamoTRIgine (LAMICTAL) 150 MG tablet Take 1 tablet (150 mg total) by mouth 2 (two) times daily. 60 tablet 1  . LANTUS SOLOSTAR 100 UNIT/ML Solostar Pen SMARTSIG:60 Unit(s) SUB-Q Every Night    . losartan (COZAAR) 25 MG tablet Take 25 mg by mouth daily. Takes differently    . losartan (COZAAR) 50 MG tablet Take 50 mg by mouth daily.    . meclizine (ANTIVERT) 25 MG tablet Take 25 mg by mouth 2 (two) times daily as needed.    . meloxicam (MOBIC) 7.5 MG tablet TAKE 1 TABLET BY MOUTH EVERY DAY    . metFORMIN (GLUCOPHAGE) 500 MG tablet Take 500 mg by mouth 2 (two) times daily with a meal.    . pantoprazole (PROTONIX) 40 MG tablet Take 40 mg by mouth daily.    Marland Kitchen spironolactone (ALDACTONE) 25 MG tablet Take 25 mg by mouth daily.     No current facility-administered medications for this visit.     Musculoskeletal: Strength & Muscle Tone: UTA Gait & Station: UTA Patient leans: N/A  Psychiatric Specialty Exam: Review of Systems  Psychiatric/Behavioral: Positive for sleep disturbance. The patient is nervous/anxious.        Mood swings  All other systems reviewed and are negative.   There were no vitals taken for this visit.There is no height or weight on file to calculate BMI.  General Appearance: Casual  Eye Contact:  Fair  Speech:  Normal Rate  Volume:  Normal  Mood:  Anxious  Affect:  Tearful  Thought Process:  Goal Directed and Descriptions of Associations: Intact  Orientation:  Full (Time, Place, and Person)  Thought Content: Logical   Suicidal Thoughts:  No  Homicidal Thoughts:  No  Memory:  Immediate;   Fair Recent;   Fair Remote;   Fair  Judgement:  Fair  Insight:  Fair  Psychomotor Activity:  Normal  Concentration:  Concentration: Fair and Attention Span: Fair  Recall:  [employer] of Knowledge: Fair  Language: Fair  Akathisia:  No  Handed:  Right  AIMS (if indicated):UTA  Assets:  Communication Skills Desire for Improvement Housing Social Support  ADL's:  Intact  Cognition: WNL  Sleep:  Excessive during the day   Screenings: PHQ2-9   Flowsheet Row Procedure visit from 09/18/2020 in [facility] Procedure visit from 08/28/2020 in [facility]  PHQ-2 Total Score 0 0       Assessment and Plan: Margaret [name] is a 65 year old Caucasian female on disability, divorced, currently lives in [place], has a history of bipolar disorder, chronic pain, insomnia, diabetes melitis, hypertension, hyperlipidemia was evaluated by telemedicine today.  Patient is biologically predisposed given her family history, history of trauma.  Patient with psychosocial stressors of mother's death, current pandemic, holidays, daughter's relocation.  Patient is currently struggling with panic attacks, excessive daytime sleepiness.  She will benefit from medication readjustment as well as sleep study referral.  Plan Bipolar disorder type I depressed, mild-improving Lamotrigine 150 mg p.o. twice daily Abilify 7.5 mg p.o. daily Gabapentin 600 mg p.o. 3 times daily  Sleep disorder-unstable Patient reports sleep is okay at night. She however does struggle with excessive daytime sleepiness and also has snoring. Will refer for sleep study. Epworth sleep scale completed today-17  Panic attacks-unstable We will start Lexapro 5 mg p.o. daily with supper Start hydroxyzine 25 to 50 mg p.o. daily as needed for severe panic attacks only. Continue psychotherapy sessions.  Follow-up in clinic in 3 weeks or sooner if needed.  I have spent atleast 20 minutes face to face by video with patient today. More than 50 % of the time  was spent for preparing to see the patient ( e.g., review of test, records ),  ordering medications and test ,psychoeducation and supportive psychotherapy and care coordination,as well as documenting clinical information in electronic health record. This note was generated in part or whole with voice recognition software. Voice recognition is usually quite accurate but there are transcription errors that can and very often do occur. I apologize for any typographical errors that were not detected and corrected.        Ursula Alert, MD 11/07/2020, 9:45 AM

## 2020-11-06 NOTE — Patient Instructions (Signed)
Hydroxyzine capsules or tablets What is this medicine? HYDROXYZINE (hye [place] i zeen) is an antihistamine. This medicine is used to treat allergy symptoms. It is also used to treat anxiety and tension. This medicine can be used with other medicines to induce sleep before surgery. This medicine may be used for other purposes; ask your health care provider or pharmacist if you have questions. COMMON BRAND NAME(S): ANX, Atarax, Rezine, Vistaril What should I tell my health care provider before I take this medicine? They need to know if you have any of these conditions:  glaucoma  heart disease  history of irregular heartbeat  kidney disease  liver disease  lung or breathing disease, like asthma  stomach or intestine problems  thyroid disease  trouble passing urine  an unusual or allergic reaction to hydroxyzine, cetirizine, other medicines, foods, dyes or preservatives  pregnant or trying to get pregnant  breast-feeding How should I use this medicine? Take this medicine by mouth with a full glass of water. Follow the directions on the prescription label. You may take this medicine with food or on an empty stomach. Take your medicine at regular intervals. Do not take your medicine more often than directed. Talk to your pediatrician regarding the use of this medicine in children. Special care may be needed. While this drug may be prescribed for children as young as 73 years of age for selected conditions, precautions do apply. Patients over 36 years old may have a stronger reaction and need a smaller dose. Overdosage: If you think you have taken too much of this medicine contact a poison control center or emergency room at once. NOTE: This medicine is only for you. Do not share this medicine with others. What if I miss a dose? If you miss a dose, take it as soon as you can. If it is almost time for your next dose, take only that dose. Do not take double or extra doses. What may  interact with this medicine? Do not take this medicine with any of the following medications:  cisapride  dronedarone  pimozide  thioridazine This medicine may also interact with the following medications:  alcohol  antihistamines for allergy, cough, and cold  atropine  barbiturate medicines for sleep or seizures, like phenobarbital  certain antibiotics like erythromycin or clarithromycin  certain medicines for anxiety or sleep  certain medicines for bladder problems like oxybutynin, tolterodine  certain medicines for depression or psychotic disturbances  certain medicines for irregular heart beat  certain medicines for Parkinson's disease like benztropine, trihexyphenidyl  certain medicines for seizures like phenobarbital, primidone  certain medicines for stomach problems like dicyclomine, hyoscyamine  certain medicines for travel sickness like scopolamine  ipratropium  narcotic medicines for pain  other medicines that prolong the QT interval (an abnormal heart rhythm) like dofetilide This list may not describe all possible interactions. Give your health care provider a list of all the medicines, herbs, non-prescription drugs, or dietary supplements you use. Also tell them if you smoke, drink alcohol, or use illegal drugs. Some items may interact with your medicine. What should I watch for while using this medicine? Tell your doctor or health care professional if your symptoms do not improve. You may get drowsy or dizzy. Do not drive, use machinery, or do anything that needs mental alertness until you know how this medicine affects you. Do not stand or sit up quickly, especially if you are an older patient. This reduces the risk of dizzy or fainting spells. Alcohol may  interfere with the effect of this medicine. Avoid alcoholic drinks. Your mouth may get dry. Chewing sugarless gum or sucking hard candy, and drinking plenty of water may help. Contact your doctor if the  problem does not go away or is severe. This medicine may cause dry eyes and blurred vision. If you wear contact lenses you may feel some discomfort. Lubricating drops may help. See your eye doctor if the problem does not go away or is severe. If you are receiving skin tests for allergies, tell your doctor you are using this medicine. What side effects may I notice from receiving this medicine? Side effects that you should report to your doctor or health care professional as soon as possible:  allergic reactions like skin rash, itching or hives, swelling of the face, lips, or tongue  changes in vision  confusion  fast, irregular heartbeat  seizures  tremor  trouble passing urine or change in the amount of urine Side effects that usually do not require medical attention (report to your doctor or health care professional if they continue or are bothersome):  constipation  drowsiness  dry mouth  headache  tiredness This list may not describe all possible side effects. Call your doctor for medical advice about side effects. You may report side effects to FDA at 1-800-FDA-1088. Where should I keep my medicine? Keep out of the reach of children. Store at room temperature between 15 and 30 degrees C (59 and 86 degrees F). Keep container tightly closed. Throw away any unused medicine after the expiration date. NOTE: This sheet is a summary. It may not cover all possible information. If you have questions about this medicine, talk to your doctor, pharmacist, or health care provider.  2020 Elsevier/Gold Standard (2018-10-10 13:19:55) Escitalopram tablets What is this medicine? ESCITALOPRAM (es sye TAL oh pram) is used to treat depression and certain types of anxiety. This medicine may be used for other purposes; ask your health care provider or pharmacist if you have questions. COMMON BRAND NAME(S): Lexapro What should I tell my health care provider before I take this medicine? They  need to know if you have any of these conditions:  bipolar disorder or a family history of bipolar disorder  diabetes  glaucoma  heart disease  kidney or liver disease  receiving electroconvulsive therapy  seizures (convulsions)  suicidal thoughts, plans, or attempt by you or a family member  an unusual or allergic reaction to escitalopram, the related drug citalopram, other medicines, foods, dyes, or preservatives  pregnant or trying to become pregnant  breast-feeding How should I use this medicine? Take this medicine by mouth with a glass of water. Follow the directions on the prescription label. You can take it with or without food. If it upsets your stomach, take it with food. Take your medicine at regular intervals. Do not take it more often than directed. Do not stop taking this medicine suddenly except upon the advice of your doctor. Stopping this medicine too quickly may cause serious side effects or your condition may worsen. A special MedGuide will be given to you by the pharmacist with each prescription and refill. Be sure to read this information carefully each time. Talk to your pediatrician regarding the use of this medicine in children. Special care may be needed. Overdosage: If you think you have taken too much of this medicine contact a poison control center or emergency room at once. NOTE: This medicine is only for you. Do not share this medicine with others.  What if I miss a dose? If you miss a dose, take it as soon as you can. If it is almost time for your next dose, take only that dose. Do not take double or extra doses. What may interact with this medicine? Do not take this medicine with any of the following medications:  certain medicines for fungal infections like fluconazole, itraconazole, ketoconazole, posaconazole, voriconazole  cisapride  citalopram  dronedarone  linezolid  MAOIs like Carbex, Eldepryl, Marplan, Nardil, and Parnate  methylene  blue (injected into a vein)  pimozide  thioridazine This medicine may also interact with the following medications:  alcohol  amphetamines  aspirin and aspirin-like medicines  carbamazepine  certain medicines for depression, anxiety, or psychotic disturbances  certain medicines for migraine headache like almotriptan, eletriptan, frovatriptan, naratriptan, rizatriptan, sumatriptan, zolmitriptan  certain medicines for sleep  certain medicines that treat or prevent blood clots like warfarin, enoxaparin, dalteparin  cimetidine  diuretics  dofetilide  fentanyl  furazolidone  isoniazid  lithium  metoprolol  NSAIDs, medicines for pain and inflammation, like ibuprofen or naproxen  other medicines that prolong the QT interval (cause an abnormal heart rhythm)  procarbazine  rasagiline  supplements like St. John's wort, kava kava, valerian  tramadol  tryptophan  ziprasidone This list may not describe all possible interactions. Give your health care provider a list of all the medicines, herbs, non-prescription drugs, or dietary supplements you use. Also tell them if you smoke, drink alcohol, or use illegal drugs. Some items may interact with your medicine. What should I watch for while using this medicine? Tell your doctor if your symptoms do not get better or if they get worse. Visit your doctor or health care professional for regular checks on your progress. Because it may take several weeks to see the full effects of this medicine, it is important to continue your treatment as prescribed by your doctor. Patients and their families should watch out for new or worsening thoughts of suicide or depression. Also watch out for sudden changes in feelings such as feeling anxious, agitated, panicky, irritable, hostile, aggressive, impulsive, severely restless, overly excited and hyperactive, or not being able to sleep. If this happens, especially at the beginning of treatment  or after a change in dose, call your health care professional. Dennis Bast may get drowsy or dizzy. Do not drive, use machinery, or do anything that needs mental alertness until you know how this medicine affects you. Do not stand or sit up quickly, especially if you are an older patient. This reduces the risk of dizzy or fainting spells. Alcohol may interfere with the effect of this medicine. Avoid alcoholic drinks. Your mouth may get dry. Chewing sugarless gum or sucking hard candy, and drinking plenty of water may help. Contact your doctor if the problem does not go away or is severe. What side effects may I notice from receiving this medicine? Side effects that you should report to your doctor or health care professional as soon as possible:  allergic reactions like skin rash, itching or hives, swelling of the face, lips, or tongue  anxious  black, tarry stools  changes in vision  confusion  elevated mood, decreased need for sleep, racing thoughts, impulsive behavior  eye pain  fast, irregular heartbeat  feeling faint or lightheaded, falls  feeling agitated, angry, or irritable  hallucination, loss of contact with reality  loss of balance or coordination  loss of memory  painful or prolonged erections  restlessness, pacing, inability to keep still  seizures  stiff muscles  suicidal thoughts or other mood changes  trouble sleeping  unusual bleeding or bruising  unusually weak or tired  vomiting Side effects that usually do not require medical attention (report to your doctor or health care professional if they continue or are bothersome):  changes in appetite  change in sex drive or performance  headache  increased sweating  indigestion, nausea  tremors This list may not describe all possible side effects. Call your doctor for medical advice about side effects. You may report side effects to FDA at 1-800-FDA-1088. Where should I keep my medicine? Keep out of  reach of children. Store at room temperature between 15 and 30 degrees C (59 and 86 degrees F). Throw away any unused medicine after the expiration date. NOTE: This sheet is a summary. It may not cover all possible information. If you have questions about this medicine, talk to your doctor, pharmacist, or health care provider.  2020 Elsevier/Gold Standard (2018-10-10 11:21:44)

## 2020-11-06 NOTE — Patient Instructions (Signed)
Access Code: [email or web address] URL: [email or web address] Date: 11/06/2020 Prepared by: Greig Castilla Santosha Jividen  Exercises Supine Chin Tuck - 1 x daily - 7 x weekly - 2 sets - 10 reps Seated Upper Trapezius Stretch - 1 x daily - 7 x weekly - 3 reps - 20 second hold Seated Scapular Retraction - 1 x daily - 7 x weekly - 2 sets - 10 reps

## 2020-11-07 ENCOUNTER — Encounter (HOSPITAL_COMMUNITY): Payer: Self-pay | Admitting: Physical Therapy

## 2020-11-07 ENCOUNTER — Encounter: Payer: Self-pay | Admitting: Psychiatry

## 2020-11-07 ENCOUNTER — Ambulatory Visit (HOSPITAL_COMMUNITY): Payer: Medicaid Other | Admitting: Physical Therapy

## 2020-11-07 DIAGNOSIS — R2689 Other abnormalities of gait and mobility: Secondary | ICD-10-CM

## 2020-11-07 DIAGNOSIS — M542 Cervicalgia: Secondary | ICD-10-CM

## 2020-11-07 DIAGNOSIS — R29898 Other symptoms and signs involving the musculoskeletal system: Secondary | ICD-10-CM

## 2020-11-07 NOTE — Therapy (Signed)
[place], [place], [address] Phone: [phone]   Fax:  [phone]  Physical Therapy Treatment  Patient Details  Name: Margaret [name] MRN: [id number] Date of Birth: Mar 28, 1956 Referring Provider (PT): Gillis Santa MD   Encounter Date: 11/07/2020   PT End of Session - 11/07/20 1448    Visit Number 3    Number of Visits 8    Date for PT Re-Evaluation 12/03/20    Authorization Type [employer]    Authorization Time Period 3 visits requested 11/02/20- 2/1/22check auth, will need to request more per POC    Authorization - Visit Number 2    Authorization - Number of Visits 3    PT Start Time [id number]    PT Stop Time 1524    PT Time Calculation (min) 38 min    Activity Tolerance Patient tolerated treatment well    Behavior During Therapy [facility] for tasks assessed/performed           Past Medical History:  Diagnosis Date  . Bipolar 1 disorder ([place])   . Diabetes mellitus without complication (HCC)    Type 2  . Hypertension     Past Surgical History:  Procedure Laterality Date  . ABDOMINAL HYSTERECTOMY    . CHOLECYSTECTOMY    . COLONOSCOPY WITH PROPOFOL N/A 06/23/2019   Procedure: COLONOSCOPY WITH PROPOFOL;  Surgeon: Jonathon Bellows, MD;  Location: [facility] ENDOSCOPY;  Service: Gastroenterology;  Laterality: N/A;  . ESOPHAGOGASTRODUODENOSCOPY (EGD) WITH PROPOFOL N/A 06/23/2019   Procedure: ESOPHAGOGASTRODUODENOSCOPY (EGD) WITH PROPOFOL;  Surgeon: Jonathon Bellows, MD;  Location: [facility] ENDOSCOPY;  Service: Gastroenterology;  Laterality: N/A;  . TONSILLECTOMY      There were no vitals filed for this visit.   Subjective Assessment - 11/07/20 1449    Subjective Patient states she was sore in the same places when she woke up this morning. She felt like what we did yesterday helped.    Limitations Lifting;House hold activities    Patient Stated Goals improve R shoulder/ Neck pain    Currently in Pain? No/denies                              [facility] Adult PT Treatment/Exercise - 11/07/20 0001      Neck Exercises: Seated   Other Seated Exercise UT stretch 3x 20 seconds bilateral    Other Seated Exercise scap retraction 2x15; t/sp ext over chair 10x 5 second' rows red band 2x 10      Manual Therapy   Manual Therapy Soft tissue mobilization    Manual therapy comments completed independently from all other aspects of treatment    Soft tissue mobilization R UT, levator scapulae; self STM with cane                  PT Education - 11/07/20 1449    Education Details Patient educated on HEP, exercise mechanics    Person(s) Educated Patient    Methods Explanation;Demonstration    Comprehension Verbalized understanding;Returned demonstration            PT Short Term Goals - 10/29/20 1629      PT SHORT TERM GOAL #1   Title Patient will be independent with HEP in order to improve functional outcomes.    Time 2    Period Weeks    Status New    Target Date 11/19/20      PT SHORT TERM GOAL #2  Title Patient will report at least 25% improvement in symptoms for improved quality of life.    Time 2    Period Weeks    Status New    Target Date 11/12/20             PT Long Term Goals - 10/29/20 1630      PT LONG TERM GOAL #1   Title Patient will report at least 75% improvement in symptoms for improved quality of life.    Time 4    Period Weeks    Status New    Target Date 11/26/20      PT LONG TERM GOAL #2   Title Patient will demonstrate at least 25% improvement in cervical ROM in all restricted planes for imporved mobility to move neck while at home.    Time 4    Period Weeks    Status New    Target Date 11/26/20                 Plan - 11/07/20 1448    Clinical Impression Statement Patient tolerates manual therapy well with reduce in tissue tension noted following. Patient educated on and able to perform self STM with [facility]. Patient requires verbal cueing and  demonstration to perform previously performed exercises. Patient tolerates additional postural strengthening well. Patient will continue to benefit skilled physical therapy in order to reduce impairment and improve function.    Personal Factors and Comorbidities Age;Fitness;Past/Current Experience;Comorbidity 3+;Time since onset of injury/illness/exacerbation    Comorbidities bipolar, DM, HTN    Examination-Activity Limitations Bend;Carry;Lift    Examination-Participation Restrictions Meal Prep;Laundry;Cleaning    Stability/Clinical Decision Making Stable/Uncomplicated    Rehab Potential Good    PT Frequency 2x / week    PT Duration 4 weeks   beginning in Jan.   PT Treatment/Interventions ADLs/Self Care Home Management;Cryotherapy;Electrical Stimulation;Moist Heat;Traction;Ultrasound;Stair training;DME Instruction;Functional mobility training;Gait training;Therapeutic activities;Therapeutic exercise;Balance training;Neuromuscular re-education;Patient/family education;Manual techniques;Dry needling;Energy conservation;Joint Manipulations;Spinal Manipulations;Vestibular;Passive range of motion    PT Next Visit Plan continue manual for pain/mobility, postural strengthening, cervical spine mobility    PT Home Exercise Plan 1/4 cervical retraction in supine, scap retraction in seated, UT stretch    Consulted and Agree with Plan of Care Patient           Patient will benefit from skilled therapeutic intervention in order to improve the following deficits and impairments:  Decreased range of motion,Decreased activity tolerance,Pain,Hypomobility,Decreased mobility,Decreased strength,Postural dysfunction  Visit Diagnosis: Cervicalgia  Other abnormalities of gait and mobility  Other symptoms and signs involving the musculoskeletal system     Problem List Patient Active Problem List   Diagnosis Date Noted  . Panic attacks 11/06/2020  . Bipolar 1 disorder, depressed, mild ([place]) 10/08/2020  .  Neck pain on right side 09/18/2020  . Pain in joint of right shoulder 09/18/2020  . Radicular pain of left lower extremity 07/30/2020  . Pain management contract signed 07/30/2020  . Lumbar facet arthropathy 07/30/2020  . Low back pain 06/27/2020  . Joint pain 06/27/2020  . Disorder of jaw 06/27/2020  . Abnormal involuntary movement 06/27/2020  . Chronic pain syndrome 06/20/2020  . Encounter for long-term opiate analgesic use 06/20/2020  . Bilateral primary osteoarthritis of knee 06/20/2020  . Sacroiliac joint pain 06/20/2020  . History of left hip replacement 06/20/2020  . Steatosis of liver 05/06/2020  . Recurrent major depressive episodes, moderate ([place]) 05/06/2020  . Osteoporosis 05/06/2020  . Hyperlipidemia 05/06/2020  . Gastroesophageal reflux disease without esophagitis 05/06/2020  .  Bereavement 01/11/2020  . Anxiety disorder 11/14/2019  . Insomnia due to mental condition 11/14/2019  . Bipolar disorder, in full remission, most recent episode manic (HCC) 08/21/2019  . At risk for long QT syndrome 08/21/2019  . Insomnia 08/21/2019  . Iron deficiency anemia due to chronic blood loss 07/04/2019  . 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H (nonalcoholic steatohepatitis) (HCC) 07/04/2019  . Tubular adenoma 07/04/2019  . Anemia 06/21/2019  . HTN (hypertension) 06/21/2019  . Diabetes (HCC) 06/21/2019  . CKD (chronic kidney disease), stage III (HCC) 06/21/2019  . Bipolar disorder, in partial remission, most recent episode mixed (HCC) 06/21/2019  . Controlled type 2 diabetes mellitus without complication, with long-term current use of insulin (HCC) 06/21/2019  . Generalized osteoarthritis 06/21/2019  . Encounter for screening colonoscopy 07/19/2018    3:21 PM, 11/07/20 Wyman Songster PT, DPT Physical Therapist at [facility]   [facility] [address] [place], [place], [address] Phone: [phone]   Fax:   [phone]  Name: Margaret [name] MRN: [id number] Date of Birth: 16-Oct-1956

## 2020-11-08 ENCOUNTER — Telehealth: Payer: Self-pay

## 2020-11-08 NOTE — Telephone Encounter (Signed)
Medication management - Faxed sleep study referral and Dr. Shea Evans progress note from visit 11/06/20, to the [facility], located at [address] as pt. gave verbal consent by phone for items sent, to set up sleep study for patient.  Patient agreed to call our office back if had not heard back from the [facility] to schedule within the next week and faxed items to confirmed best fax # at the facility, [phone].  Copy of items faxed also sent to scan.

## 2020-11-11 ENCOUNTER — Telehealth (HOSPITAL_COMMUNITY): Payer: Self-pay | Admitting: Physical Therapy

## 2020-11-11 ENCOUNTER — Ambulatory Visit (HOSPITAL_COMMUNITY): Payer: Medicaid Other | Admitting: Physical Therapy

## 2020-11-11 ENCOUNTER — Telehealth: Payer: Self-pay

## 2020-11-11 NOTE — Telephone Encounter (Signed)
Medication management - Telephone call with pt who stated she has been experiencing a panic attack today and yesterday. Stated she used hydoxyzine and it helped. Pt. has to go out to take someone to the store today and is worried about taking and driving.  Acknowledged to pt this may cause some drowsiness and stated she felt she would be okay until she returns later to take then.  Patient stated plan to use deep breathing techniques to help until she returns home this evening and then will use hydroxyzine as prescribed if still having some increased anxiety.  Patient will call back if anxiety worsens or needs anything else.  Patient stated she is calmer after speaking and denies any suicidal or homicidal ideations and no emergency medical condition at this time.

## 2020-11-11 NOTE — Telephone Encounter (Signed)
pt cancelled appt for today and wednesday because she has something going on

## 2020-11-12 ENCOUNTER — Encounter: Payer: Self-pay | Admitting: Licensed Clinical Social Worker

## 2020-11-12 ENCOUNTER — Other Ambulatory Visit: Payer: Self-pay

## 2020-11-12 ENCOUNTER — Ambulatory Visit (INDEPENDENT_AMBULATORY_CARE_PROVIDER_SITE_OTHER): Payer: Medicaid Other | Admitting: Licensed Clinical Social Worker

## 2020-11-12 DIAGNOSIS — F3131 Bipolar disorder, current episode depressed, mild: Secondary | ICD-10-CM

## 2020-11-12 DIAGNOSIS — Z634 Disappearance and death of family member: Secondary | ICD-10-CM

## 2020-11-12 DIAGNOSIS — F410 Panic disorder [episodic paroxysmal anxiety] without agoraphobia: Secondary | ICD-10-CM | POA: Diagnosis not present

## 2020-11-12 NOTE — Progress Notes (Signed)
Virtual Visit via Video Note  I connected with Margaret [name] on 11/12/20 at 11:00 AM EST by a video enabled telemedicine application and verified that I am speaking with the correct person using two identifiers.  Participating Parties Patient Provider  Location: Patient: Home Provider: Home Office   I discussed the limitations of evaluation and management by telemedicine and the availability of in person appointments. The patient expressed understanding and agreed to proceed.  THERAPY PROGRESS NOTE  Session Time: 45 Minutes  Participation Level: Active  Behavioral Response: Well GroomedAlertAnxious  Type of Therapy: Individual Therapy  Treatment Goals addressed: Anxiety and Coping  Interventions: CBT  Summary: Margaret [name] is a 64 y.o. female who presents with depression and anxiety sxs. Pt reported "things were going okay for a while, but the last 3-4 days I have been having panic attacks. Dr. Eappen prescribed hydroxyzine for me. I took one and felt like I was drunk". Pt identified sxs of panic she experienced and reported that it first started the day before Christmas. Pt reported "today I feel okay, I don't know why this happened". Pt reported she has been experiencing more stress and feelings of overwhelm lately and attributes this with having too many appointments to keep up with. Pt reported she coped by staying home and talking to her supports. Pt reported she often is "hard on myself" and was taught from an early age to hide her emotions and to be concerned with how she appeared to others. Pt reported she is often the supportive friend to others and likes to make other people laugh. Pt reported at times feeling pressured to be there for others and described an example with a friend she made about 3 months ago who calls her daily.  Suicidal/Homicidal: No  Therapist Response: Therapist met with patient for follow up. Therapist and patient explored sxs and reactions to stressors  since last session. Therapist provided psychoeducation around panic attacks and ways to cope. Therapist led patient in grounding exercise. Pt was receptive.  Plan: Return again in 1 month.  Diagnosis: Axis I: Bipolar, Depressed, Bereavement and Panic Attacks    Axis II: N/A   P O'Reilly, LCSW, LCAS 11/12/2020  

## 2020-11-13 ENCOUNTER — Encounter (HOSPITAL_COMMUNITY): Payer: Medicaid Other | Admitting: Physical Therapy

## 2020-11-19 ENCOUNTER — Ambulatory Visit (HOSPITAL_COMMUNITY): Payer: Medicaid Other | Admitting: Physical Therapy

## 2020-11-20 ENCOUNTER — Telehealth (INDEPENDENT_AMBULATORY_CARE_PROVIDER_SITE_OTHER): Payer: Medicaid Other | Admitting: Psychiatry

## 2020-11-20 ENCOUNTER — Telehealth: Payer: Self-pay

## 2020-11-20 ENCOUNTER — Other Ambulatory Visit (HOSPITAL_COMMUNITY): Payer: Self-pay | Admitting: Internal Medicine

## 2020-11-20 ENCOUNTER — Other Ambulatory Visit: Payer: Self-pay

## 2020-11-20 ENCOUNTER — Other Ambulatory Visit: Payer: Self-pay | Admitting: Internal Medicine

## 2020-11-20 ENCOUNTER — Other Ambulatory Visit: Payer: Self-pay | Admitting: Psychiatry

## 2020-11-20 ENCOUNTER — Encounter: Payer: Self-pay | Admitting: Psychiatry

## 2020-11-20 DIAGNOSIS — F3131 Bipolar disorder, current episode depressed, mild: Secondary | ICD-10-CM

## 2020-11-20 DIAGNOSIS — G479 Sleep disorder, unspecified: Secondary | ICD-10-CM | POA: Diagnosis not present

## 2020-11-20 DIAGNOSIS — F410 Panic disorder [episodic paroxysmal anxiety] without agoraphobia: Secondary | ICD-10-CM | POA: Diagnosis not present

## 2020-11-20 DIAGNOSIS — Z634 Disappearance and death of family member: Secondary | ICD-10-CM

## 2020-11-20 DIAGNOSIS — I6523 Occlusion and stenosis of bilateral carotid arteries: Secondary | ICD-10-CM

## 2020-11-20 MED ORDER — ESCITALOPRAM OXALATE 10 MG PO TABS: 10.0000 mg | ORAL_TABLET | Freq: Every day | ORAL | 1 refills | Status: DC

## 2020-11-20 NOTE — Progress Notes (Signed)
Virtual Visit via Video Note  I connected with Margaret [name] on 11/20/20 at  3:20 PM EST by a video enabled telemedicine application and verified that I am speaking with the correct person using two identifiers.  Location Provider Location : [facility] Patient Location : Home  Participants: Patient , Provider    I discussed the limitations of evaluation and management by telemedicine and the availability of in person appointments. The patient expressed understanding and agreed to proceed.    I discussed the assessment and treatment plan with the patient. The patient was provided an opportunity to ask questions and all were answered. The patient agreed with the plan and demonstrated an understanding of the instructions.   The patient was advised to call back or seek an in-person evaluation if the symptoms worsen or if the condition fails to improve as anticipated.   [place] MD OP Progress Note  11/20/2020 3:49 PM Margaret [name]  MRN:  [id number]  Chief Complaint:  Chief Complaint    Follow-up     HPI: Margaret [name] is a 65 year old Caucasian female on disability, divorced, lives in [place], has a history of bipolar disorder, hyperlipidemia, insomnia, diabetes mellitus, chronic pain, hypertension was evaluated by telemedicine today.  Patient today reports she continues to struggle with anxiety symptoms.  She reports she has this nervous feeling in her stomach.  She reports she has been taking the hydroxyzine which helps.  Some days she needs 2 dosages of the hydroxyzine and that helps better.  She is compliant on the Lexapro and denies any side effects.  The Lexapro was started 2 weeks ago.  Patient reports she continues to have mood swings on and off.  She however reports her crying spells as improved compared to how it was previously.  Patient continues to be in psychotherapy sessions and reports therapy sessions as beneficial.  Patient denies any suicidality, homicidality or perceptual  disturbances.  Patient reports she has multiple doctors appointments every week and wonders if that is making her too overwhelmed and anxious.  She reports she is planning to take a break from all doctors office visits for 2 weeks in February and is planning to spend time with family and friends.  She is hoping that will help her with her mood.  Patient reports she continues to struggle with sleep.  She has not heard back from sleep clinic yet.  Patient denies any other concerns today.  Visit Diagnosis:    ICD-10-CM   1. Bipolar 1 disorder, depressed, mild ([place])  F31.31   2. Bereavement  Z63.4   3. Sleep disorder  G47.9    insomnia and hypersomnia  4. Panic attacks  F41.0 escitalopram (LEXAPRO) 10 MG tablet    Past Psychiatric History: I have reviewed past psychiatric history from my progress note on 08/21/2019.  Past trials of Depakote, lithium, risperidone, Xanax  Past Medical History:  Past Medical History:  Diagnosis Date  . Bipolar 1 disorder ([place])   . Diabetes mellitus without complication (HCC)    Type 2  . Hypertension     Past Surgical History:  Procedure Laterality Date  . ABDOMINAL HYSTERECTOMY    . CHOLECYSTECTOMY    . COLONOSCOPY WITH PROPOFOL N/A 06/23/2019   Procedure: COLONOSCOPY WITH PROPOFOL;  Surgeon: Jonathon Bellows, MD;  Location: [facility] ENDOSCOPY;  Service: Gastroenterology;  Laterality: N/A;  . ESOPHAGOGASTRODUODENOSCOPY (EGD) WITH PROPOFOL N/A 06/23/2019   Procedure: ESOPHAGOGASTRODUODENOSCOPY (EGD) WITH PROPOFOL;  Surgeon: Jonathon Bellows, MD;  Location: [facility] ENDOSCOPY;  Service: Gastroenterology;  Laterality: N/A;  . TONSILLECTOMY      Family Psychiatric History: I have reviewed family psychiatric history from my progress note on 08/21/2019  Family History:  Family History  Problem Relation Age of Onset  . Mental illness Father   . Bipolar disorder Brother   . Bipolar disorder Paternal Uncle   . Bipolar disorder Paternal Uncle     Social History:  Reviewed social history from my progress note on 08/21/2019 Social History   Socioeconomic History  . Marital status: Divorced    Spouse name: Not on file  . Number of children: 2  . Years of education: Not on file  . Highest education level: Not on file  Occupational History  . Not on file  Tobacco Use  . Smoking status: Former Smoker    Types: Cigarettes    Quit date: 03/03/2011    Years since quitting: 9.7  . Smokeless tobacco: Never Used  Vaping Use  . Vaping Use: Never used  Substance and Sexual Activity  . Alcohol use: Not Currently  . Drug use: Not Currently  . Sexual activity: Not on file  Other Topics Concern  . Not on file  Social History Narrative  . Not on file   Social Determinants of Health   Financial Resource Strain: Not on file  Food Insecurity: Not on file  Transportation Needs: Not on file  Physical Activity: Not on file  Stress: Not on file  Social Connections: Not on file    Allergies:  Allergies  Allergen Reactions  . Lithium Nausea And Vomiting  . Penicillins Hives    Resulted in hospitalization  . Sulfa Antibiotics Hives    Resulted in hospitalization    Metabolic Disorder Labs: Lab Results  Component Value Date   HGBA1C 7.3 (H) 06/22/2019   MPG 162.81 06/22/2019   No results found for: PROLACTIN No results found for: CHOL, TRIG, HDL, CHOLHDL, VLDL, LDLCALC Lab Results  Component Value Date   TSH 1.737 11/16/2019    Therapeutic Level Labs: No results found for: LITHIUM No results found for: VALPROATE No components found for:  CBMZ  Current Medications: Current Outpatient Medications  Medication Sig Dispense Refill  . escitalopram (LEXAPRO) 10 MG tablet Take 1 tablet (10 mg total) by mouth daily. 30 tablet 1  . acetaminophen (TYLENOL) 500 MG tablet Take 500 mg by mouth every 6 (six) hours as needed.     Marland Kitchen alendronate (FOSAMAX) 35 MG tablet Take 35 mg by mouth every 7 (seven) days. Take with a full glass of water on an empty  stomach.    Marland Kitchen amLODipine (NORVASC) 10 MG tablet Take 10 mg by mouth daily.    . ARIPiprazole (ABILIFY) 15 MG tablet Take 0.5 tablets (7.5 mg total) by mouth daily. 45 tablet 0  . B Complex-C (B-COMPLEX WITH VITAMIN C) tablet Take 1 tablet by mouth daily. 30 tablet 2  . bisacodyl (DULCOLAX) 5 MG EC tablet Take 5 mg by mouth daily as needed for moderate constipation.    . bisoprolol-hydrochlorothiazide (ZIAC) 5-6.25 MG tablet Take 1 tablet by mouth daily.    . cetirizine (ZYRTEC) 10 MG tablet Take 10 mg by mouth daily.    . clindamycin (CLEOCIN) 150 MG capsule TAKE 4 CAPSULES 1 HOURABEFORE DENTAL APPOINTMENT.    . cloNIDine (CATAPRES) 0.1 MG tablet clonidine HCl 0.1 mg tablet  TAKE 1 TABLET BY MOUTH 2 TIMES A DAY    . Dulaglutide (TRULICITY) A999333 0000000 SOPN Inject 0.75 mg into the skin  once a week. Thursday    . Ertugliflozin L-PyroglutamicAc (STEGLATRO) 15 MG TABS Take by mouth.     . ferrous sulfate 325 (65 FE) MG tablet Take 1 tablet (325 mg total) by mouth 2 (two) times daily with a meal. 120 tablet 0  . furosemide (LASIX) 20 MG tablet Take 10 mg by mouth daily as needed.    . gabapentin (NEURONTIN) 600 MG tablet TAKE (1) TABLET BY MOUTH (3) TIMES DAILY. 270 tablet 0  . glimepiride (AMARYL) 2 MG tablet Take 2 mg by mouth daily with breakfast.     . hydrochlorothiazide (HYDRODIURIL) 25 MG tablet Take 25 mg by mouth daily.     Marland Kitchen HYDROcodone-acetaminophen (NORCO) 10-325 MG tablet Take 1 tablet by mouth 3 (three) times daily. For chronic pain syndrome. To last for 30 days from fill date. 90 tablet 0  . hydrOXYzine (VISTARIL) 25 MG capsule Take 1-2 capsules (25-50 mg total) by mouth daily as needed for anxiety. Limit use - take only for severe panic attacks 60 capsule 1  . Insulin Glargine (LANTUS SOLOSTAR) 100 UNIT/ML Solostar Pen Inject 60 Units into the skin at bedtime.     . lamoTRIgine (LAMICTAL) 150 MG tablet Take 1 tablet (150 mg total) by mouth 2 (two) times daily. 60 tablet 1  . LANTUS  SOLOSTAR 100 UNIT/ML Solostar Pen SMARTSIG:60 Unit(s) SUB-Q Every Night    . losartan (COZAAR) 25 MG tablet Take 25 mg by mouth daily. Takes differently    . losartan (COZAAR) 50 MG tablet Take 50 mg by mouth daily.    . meclizine (ANTIVERT) 25 MG tablet Take 25 mg by mouth 2 (two) times daily as needed.    . meloxicam (MOBIC) 7.5 MG tablet TAKE 1 TABLET BY MOUTH EVERY DAY    . metFORMIN (GLUCOPHAGE) 500 MG tablet Take 500 mg by mouth 2 (two) times daily with a meal.    . pantoprazole (PROTONIX) 40 MG tablet Take 40 mg by mouth daily.    Marland Kitchen spironolactone (ALDACTONE) 25 MG tablet Take 25 mg by mouth daily.     No current facility-administered medications for this visit.     Musculoskeletal: Strength & Muscle Tone: UTA Gait & Station: UTA Patient leans: N/A  Psychiatric Specialty Exam: Review of Systems  Psychiatric/Behavioral: Positive for sleep disturbance. The patient is nervous/anxious.   All other systems reviewed and are negative.   There were no vitals taken for this visit.There is no height or weight on file to calculate BMI.  General Appearance: Casual  Eye Contact:  Fair  Speech:  Clear and Coherent  Volume:  Normal  Mood:  Anxious  Affect:  Congruent  Thought Process:  Goal Directed and Descriptions of Associations: Intact  Orientation:  Full (Time, Place, and Person)  Thought Content: Logical   Suicidal Thoughts:  No  Homicidal Thoughts:  No  Memory:  Immediate;   Fair Recent;   Fair Remote;   Fair  Judgement:  Fair  Insight:  Fair  Psychomotor Activity:  Normal  Concentration:  Concentration: Fair and Attention Span: Fair  Recall:  [employer] of Knowledge: Fair  Language: Fair  Akathisia:  No  Handed:  Right  AIMS (if indicated):UTA  Assets:  Communication Skills Desire for Improvement Housing Social Support  ADL's:  Intact  Cognition: WNL  Sleep:  Poor    Screenings: PHQ2-9   Flowsheet Row Procedure visit from 09/18/2020 in [facility] Procedure visit from 08/28/2020 in  [facility]  PHQ-2 Total Score 0 0       Assessment and Plan: Margaret [name] is a 65 year old Caucasian female on disability, divorced, currently lives in [place], has a history of bipolar disorder, chronic pain, insomnia, diabetes melitis, hypertension, hyperlipidemia was evaluated by telemedicine today.  She is biologically predisposed given her family history, history of trauma.  Patient with psychosocial stressors of mother's death, current pandemic, holidays, daughter's relocation.  Patient continues to struggle with anxiety symptoms, sleep problems and will benefit from the following plan.  Plan Bipolar disorder type I depressed, mild- improving Lamotrigine 150 mg p.o. twice daily. Abilify 7.5 mg p.o. daily Gabapentin 600 mg p.o. 3 times daily  Sleep disorder-unstable Patient has been referred for sleep study-pending Epworth sleep scale- 11/06/2020-17  Panic attacks- unstable Increase Lexapro to 10 mg p.o. daily with supper Hydroxyzine 25-50 mg p.o. daily as needed for severe panic attacks. Continue psychotherapy sessions.  Supportive psychotherapy was provided.  Patient advised to work on IT trainer.  Follow-up in clinic in 2 to 3 weeks or sooner if needed.  I have spent atleast 20 minutes face to face by video with patient today. More than 50 % of the time was spent for preparing to see the patient ( e.g., review of test, records ), ordering medications and test ,psychoeducation and supportive psychotherapy and care coordination,as well as documenting clinical information in electronic health record. This note was generated in part or whole with voice recognition software. Voice recognition is usually quite accurate but there are transcription errors that can and very often do occur. I apologize for any typographical errors that were not detected and  corrected.     Ursula Alert, MD 11/21/2020, 9:37 AM

## 2020-11-20 NOTE — Telephone Encounter (Signed)
Medication management - Telephone call with Merrilee Seashore at the [facility] to follow up on patient's status with recent sleep study referral. Collateral confirmed that they had received everything and had sent this off to patient's new [facility] plan for authorization on 11/14/20.  Collateral stated those typically take about a week to get back and verified once they receive the approval, they will call patient and set up the time for the sleep study.  Called patient to make her aware and she will call back in one week if has heard anything by then.

## 2020-11-20 NOTE — Telephone Encounter (Signed)
pt called left message that she is taking losartan 50mg 

## 2020-11-21 ENCOUNTER — Encounter (HOSPITAL_COMMUNITY): Payer: Self-pay | Admitting: Physical Therapy

## 2020-11-21 ENCOUNTER — Ambulatory Visit (HOSPITAL_COMMUNITY): Payer: Medicaid Other | Admitting: Physical Therapy

## 2020-11-21 DIAGNOSIS — R29898 Other symptoms and signs involving the musculoskeletal system: Secondary | ICD-10-CM

## 2020-11-21 DIAGNOSIS — R2689 Other abnormalities of gait and mobility: Secondary | ICD-10-CM

## 2020-11-21 DIAGNOSIS — M542 Cervicalgia: Secondary | ICD-10-CM | POA: Diagnosis not present

## 2020-11-21 NOTE — Therapy (Signed)
[place], [place], [address] Phone: [phone]   Fax:  [phone]  Physical Therapy Treatment  Patient Details  Name: Margaret [name] MRN: [id number] Date of Birth: September 16, 1956 Referring Provider (PT): Gillis Santa MD   Encounter Date: 11/21/2020   PT End of Session - 11/21/20 1315    Visit Number 4    Number of Visits 8    Date for PT Re-Evaluation 12/03/20    Authorization Type [employer]    Authorization Time Period 3 visits requested 11/02/20- 2/1/22check auth, additional visits requested - waiting for auth    Authorization - Visit Number 3    Authorization - Number of Visits 3    PT Start Time 8921    PT Stop Time 1355    PT Time Calculation (min) 40 min    Activity Tolerance Patient tolerated treatment well    Behavior During Therapy [facility] for tasks assessed/performed           Past Medical History:  Diagnosis Date  . Bipolar 1 disorder ([place])   . Diabetes mellitus without complication (HCC)    Type 2  . Hypertension     Past Surgical History:  Procedure Laterality Date  . ABDOMINAL HYSTERECTOMY    . CHOLECYSTECTOMY    . COLONOSCOPY WITH PROPOFOL N/A 06/23/2019   Procedure: COLONOSCOPY WITH PROPOFOL;  Surgeon: Jonathon Bellows, MD;  Location: [facility] ENDOSCOPY;  Service: Gastroenterology;  Laterality: N/A;  . ESOPHAGOGASTRODUODENOSCOPY (EGD) WITH PROPOFOL N/A 06/23/2019   Procedure: ESOPHAGOGASTRODUODENOSCOPY (EGD) WITH PROPOFOL;  Surgeon: Jonathon Bellows, MD;  Location: [facility] ENDOSCOPY;  Service: Gastroenterology;  Laterality: N/A;  . TONSILLECTOMY      There were no vitals filed for this visit.   Subjective Assessment - 11/21/20 1317    Subjective Patient states she feels more relaxed today. She has been doing her exercises a little bit. She feels relief when she does the exercise when having symptoms.    Limitations Lifting;House hold activities    Patient Stated Goals improve R shoulder/ Neck pain     Currently in Pain? No/denies                             [facility] Adult PT Treatment/Exercise - 11/21/20 0001      Neck Exercises: Seated   Other Seated Exercise UT stretch 3x 20 seconds bilateral    Other Seated Exercise scap retraction 2x15; t/sp ext over chair 10x 5 second' rows and shoulder ext  green band 2x 10 each, scapular retraction with [place] [facility] with green band 2x10                  PT Education - 11/21/20 1316    Education Details Patient educated on HEP, posture, exercise progressions, relaxation when completing exercises, avoiding compensations    Person(s) Educated Patient    Methods Explanation;Demonstration;Handout    Comprehension Verbalized understanding;Returned demonstration            PT Short Term Goals - 10/29/20 1629      PT SHORT TERM GOAL #1   Title Patient will be independent with HEP in order to improve functional outcomes.    Time 2    Period Weeks    Status New    Target Date 11/19/20      PT SHORT TERM GOAL #2   Title Patient will report at least 25% improvement in symptoms for improved quality of life.  Time 2    Period Weeks    Status New    Target Date 11/12/20             PT Long Term Goals - 10/29/20 1630      PT LONG TERM GOAL #1   Title Patient will report at least 75% improvement in symptoms for improved quality of life.    Time 4    Period Weeks    Status New    Target Date 11/26/20      PT LONG TERM GOAL #2   Title Patient will demonstrate at least 25% improvement in cervical ROM in all restricted planes for imporved mobility to move neck while at home.    Time 4    Period Weeks    Status New    Target Date 11/26/20                 Plan - 11/21/20 1316    Clinical Impression Statement Submitted for additional visits following last visit. Patient continues to require cueing for previously completed exercises that are in HEP for hold times and mechanics. Patient completes additional  postural strengthening exercises with increased resistance today with good mechanics with frequent verbal cueing and reminder for posture. Added postural strengthening of rows and extensions to HEP.  Educated patient on exercise progressions. Patient will continue to benefit from skilled physical therapy in order to reduce impairment and improve function.    Personal Factors and Comorbidities Age;Fitness;Past/Current Experience;Comorbidity 3+;Time since onset of injury/illness/exacerbation    Comorbidities bipolar, DM, HTN    Examination-Activity Limitations Bend;Carry;Lift    Examination-Participation Restrictions Meal Prep;Laundry;Cleaning    Stability/Clinical Decision Making Stable/Uncomplicated    Rehab Potential Good    PT Frequency 2x / week    PT Duration 4 weeks   beginning in Jan.   PT Treatment/Interventions ADLs/Self Care Home Management;Cryotherapy;Electrical Stimulation;Moist Heat;Traction;Ultrasound;Stair training;DME Instruction;Functional mobility training;Gait training;Therapeutic activities;Therapeutic exercise;Balance training;Neuromuscular re-education;Patient/family education;Manual techniques;Dry needling;Energy conservation;Joint Manipulations;Spinal Manipulations;Vestibular;Passive range of motion    PT Next Visit Plan continue manual for pain/mobility, postural strengthening, cervical spine mobility    PT Home Exercise Plan 1/4 cervical retraction in supine, scap retraction in seated, UT stretch 1/20 Row, ext with band    Consulted and Agree with Plan of Care Patient           Patient will benefit from skilled therapeutic intervention in order to improve the following deficits and impairments:  Decreased range of motion,Decreased activity tolerance,Pain,Hypomobility,Decreased mobility,Decreased strength,Postural dysfunction  Visit Diagnosis: Cervicalgia  Other abnormalities of gait and mobility  Other symptoms and signs involving the musculoskeletal  system     Problem List Patient Active Problem List   Diagnosis Date Noted  . Panic attacks 11/06/2020  . Bipolar 1 disorder, depressed, mild ([place]) 10/08/2020  . Neck pain on right side 09/18/2020  . Pain in joint of right shoulder 09/18/2020  . Radicular pain of left lower extremity 07/30/2020  . Pain management contract signed 07/30/2020  . Lumbar facet arthropathy 07/30/2020  . Low back pain 06/27/2020  . Joint pain 06/27/2020  . Disorder of jaw 06/27/2020  . Abnormal involuntary movement 06/27/2020  . Chronic pain syndrome 06/20/2020  . Encounter for long-term opiate analgesic use 06/20/2020  . Bilateral primary osteoarthritis of knee 06/20/2020  . Sacroiliac joint pain 06/20/2020  . History of left hip replacement 06/20/2020  . Steatosis of liver 05/06/2020  . Recurrent major depressive episodes, moderate ([place]) 05/06/2020  . Osteoporosis 05/06/2020  . Hyperlipidemia 05/06/2020  .  Gastroesophageal reflux disease without esophagitis 05/06/2020  . Bereavement 01/11/2020  . Anxiety disorder 11/14/2019  . Insomnia due to mental condition 11/14/2019  . Bipolar disorder, in full remission, most recent episode manic ([place]) 08/21/2019  . At risk for long QT syndrome 08/21/2019  . Insomnia 08/21/2019  . Iron deficiency anemia due to chronic blood loss 07/04/2019  . 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H (nonalcoholic steatohepatitis) ([place]) 07/04/2019  . Tubular adenoma 07/04/2019  . Anemia 06/21/2019  . HTN (hypertension) 06/21/2019  . Diabetes ([place]) 06/21/2019  . CKD (chronic kidney disease), stage III ([place]) 06/21/2019  . Bipolar disorder, in partial remission, most recent episode mixed ([place]) 06/21/2019  . Controlled type 2 diabetes mellitus without complication, with long-term current use of insulin ([place]) 06/21/2019  . Generalized osteoarthritis 06/21/2019  . Encounter for screening colonoscopy 07/19/2018    1:59 PM, 11/21/20 Mearl Latin PT, DPT Physical Therapist at  [facility] [place], [place], [address] Phone: [phone]   Fax:  [phone]  Name: Margaret [name] MRN: [id number] Date of Birth: January 17, 1956

## 2020-11-21 NOTE — Patient Instructions (Signed)
Access Code: [email or web address] URL: [email or web address] Date: 11/21/2020 Prepared by: [facility] Aramis Zobel  Exercises Seated Shoulder Row with Anchored Resistance - 1 x daily - 7 x weekly - 2 sets - 10 reps Shoulder Extension with Resistance - 1 x daily - 7 x weekly - 2 sets - 10 reps

## 2020-11-25 ENCOUNTER — Ambulatory Visit (HOSPITAL_COMMUNITY): Payer: Medicaid Other | Admitting: Physical Therapy

## 2020-11-25 ENCOUNTER — Encounter (HOSPITAL_COMMUNITY): Payer: Self-pay

## 2020-11-25 ENCOUNTER — Telehealth (HOSPITAL_COMMUNITY): Payer: Self-pay | Admitting: Physical Therapy

## 2020-11-25 NOTE — Telephone Encounter (Addendum)
Patient no show, left message for patient making her aware of missed appointment and reminded her of next appointment.  3:55 PM, 11/25/20 Mearl Latin PT, DPT Physical Therapist at [facility]  Patient returned phone call stating she left message on voicemail to cancel appointment for today. She also wanted to cancel next appointment and f/u with MD. Patient stated she had a lot of appointment going on and would call back if she wanted to return.  4:02 PM, 11/25/20 Mearl Latin PT, DPT Physical Therapist at [facility]

## 2020-11-27 ENCOUNTER — Ambulatory Visit (HOSPITAL_COMMUNITY): Payer: Medicaid Other | Admitting: Physical Therapy

## 2020-11-28 ENCOUNTER — Ambulatory Visit
Payer: Medicaid Other | Attending: Student in an Organized Health Care Education/Training Program | Admitting: Student in an Organized Health Care Education/Training Program

## 2020-11-28 ENCOUNTER — Encounter: Payer: Self-pay | Admitting: Student in an Organized Health Care Education/Training Program

## 2020-11-28 ENCOUNTER — Other Ambulatory Visit: Payer: Self-pay

## 2020-11-28 VITALS — BP 142/55 | HR 63 | Temp 97.4°F | Resp 18 | Ht 60.0 in | Wt 203.0 lb

## 2020-11-28 DIAGNOSIS — M47812 Spondylosis without myelopathy or radiculopathy, cervical region: Secondary | ICD-10-CM

## 2020-11-28 DIAGNOSIS — M25511 Pain in right shoulder: Secondary | ICD-10-CM

## 2020-11-28 DIAGNOSIS — Z0289 Encounter for other administrative examinations: Secondary | ICD-10-CM

## 2020-11-28 DIAGNOSIS — G894 Chronic pain syndrome: Secondary | ICD-10-CM | POA: Diagnosis present

## 2020-11-28 DIAGNOSIS — Z79891 Long term (current) use of opiate analgesic: Secondary | ICD-10-CM

## 2020-11-28 DIAGNOSIS — M5412 Radiculopathy, cervical region: Secondary | ICD-10-CM

## 2020-11-28 DIAGNOSIS — M170 Bilateral primary osteoarthritis of knee: Secondary | ICD-10-CM

## 2020-11-28 DIAGNOSIS — M47816 Spondylosis without myelopathy or radiculopathy, lumbar region: Secondary | ICD-10-CM | POA: Diagnosis present

## 2020-11-28 DIAGNOSIS — M542 Cervicalgia: Secondary | ICD-10-CM

## 2020-11-28 MED ORDER — HYDROCODONE-ACETAMINOPHEN 10-325 MG PO TABS: 1.0000 | ORAL_TABLET | Freq: Three times a day (TID) | ORAL | 0 refills | Status: AC

## 2020-11-28 MED ORDER — HYDROCODONE-ACETAMINOPHEN 10-325 MG PO TABS
1.0000 | ORAL_TABLET | Freq: Three times a day (TID) | ORAL | 0 refills | Status: DC
Start: 2021-02-06 — End: 2021-02-25

## 2020-11-28 NOTE — Patient Instructions (Signed)
Hydrocodone/APAP to last until 03/08/21 has been escribed to your pharmacy.

## 2020-11-28 NOTE — Progress Notes (Signed)
Nursing Pain Medication Assessment:  Safety precautions to be maintained throughout the outpatient stay will include: orient to surroundings, keep bed in low position, maintain call bell within reach at all times, provide assistance with transfer out of bed and ambulation.  Medication Inspection Compliance: Pill count conducted under aseptic conditions, in front of the patient. Neither the pills nor the bottle was removed from the patient's sight at any time. Once count was completed pills were immediately returned to the patient in their original bottle.  Medication: Hydrocodone/APAP Pill/Patch Count: 38 of 90 pills remain Pill/Patch Appearance: Markings consistent with prescribed medication Bottle Appearance: Standard pharmacy container. Clearly labeled. Filled Date: 1 / 7 / 22 Last Medication intake:  Today

## 2020-11-28 NOTE — Progress Notes (Signed)
PROVIDER NOTE: Information contained herein reflects review and annotations entered in association with encounter. Interpretation of such information and data should be left to medically-trained personnel. Information provided to patient can be located elsewhere in the medical record under "Patient Instructions". Document created using STT-dictation technology, any transcriptional errors that may result from process are unintentional.    Patient: Margaret [name]  Service Category: E/M  Provider: Gillis Santa, MD  DOB: October 08, 1956  DOS: 11/28/2020  Specialty: Interventional Pain Management  MRN: [id number]  Setting: Ambulatory outpatient  PCP: Rusty Aus, MD  Type: Established Patient    Referring Provider: Rusty Aus, MD  Location: Office  Delivery: Face-to-face     HPI  Margaret [name], a 65 y.o. year old female, is here today because of her Cervical facet joint syndrome [M47.812]. [name] primary complain today is Hip Pain (left) Last encounter: My last encounter with her was on 10/15/2020. Pertinent problems: Ms. Mancusi does not have any pertinent problems on file. Pain Assessment: Severity of Chronic pain is reported as a 6 /10. Location: Hip Left/denies. Onset: More than a month ago. Quality: Sharp,Shooting. Timing: Constant. Modifying factor(s): meds. Vitals:  height is 5' (1.524 m) and weight is 203 lb (92.1 kg). Her temporal temperature is 97.4 F (36.3 C) (abnormal). Her blood pressure is 142/55 (abnormal) and her pulse is 63. Her respiration is 18 and oxygen saturation is 99%.   Reason for encounter: both, medication management and post-procedure assessment.     Has completed PT for her neck, will do exercises at home Continues to endorse left hip pain, think it could be related to cold weather Knees are doing very well in regards to pain after b/l hyalgan injection  Refill Hydrocodone for 3 months  Post-Procedure Evaluation  Procedure bilateral knee Hyalgan injection #1  08/28/2020 Sedation: Please see nurses note.  Effectiveness during initial hour after procedure(Ultra-Short Term Relief): 100 %   Local anesthetic used: Long-acting (4-6 hours) Effectiveness: Defined as any analgesic benefit obtained secondary to the administration of local anesthetics. This carries significant diagnostic value as to the etiological location, or anatomical origin, of the pain. Duration of benefit is expected to coincide with the duration of the local anesthetic used.  Effectiveness during initial 4-6 hours after procedure(Short-Term Relief): 100 %   Long-term benefit: Defined as any relief past the pharmacologic duration of the local anesthetics.  Effectiveness past the initial 6 hours after procedure(Long-Term Relief): 100 %  Current benefits: Defined as benefit that persist at this time.   Analgesia:  Somewhat improved Function: Somewhat improved ROM: Somewhat improved   Pharmacotherapy Assessment   Analgesic: Norco 10 mg TID prn   Monitoring: [place] PMP: PDMP reviewed during this encounter.       Pharmacotherapy: No side-effects or adverse reactions reported. Compliance: No problems identified. Effectiveness: Clinically acceptable.  [name], RN  11/28/2020  1:48 PM  Sign when Signing Visit Nursing Pain Medication Assessment:  Safety precautions to be maintained throughout the outpatient stay will include: orient to surroundings, keep bed in low position, maintain call bell within reach at all times, provide assistance with transfer out of bed and ambulation.  Medication Inspection Compliance: Pill count conducted under aseptic conditions, in front of the patient. Neither the pills nor the bottle was removed from the patient's sight at any time. Once count was completed pills were immediately returned to the patient in their original bottle.  Medication: Hydrocodone/APAP Pill/Patch Count: 38 of 90 pills remain Pill/Patch Appearance: Markings consistent  with  prescribed medication Bottle Appearance: Standard pharmacy container. Clearly labeled. Filled Date: 1 / 7 / 22 Last Medication intake:  Today    UDS:  Summary  Date Value Ref Range Status  06/20/2020 Note  Final    Comment:    ==================================================================== Compliance Drug Analysis, Ur ==================================================================== Test                             Result       Flag       Units  Drug Present and Declared for Prescription Verification   Hydrocodone                    2260         EXPECTED   ng/mg creat   Norhydrocodone                 2128         EXPECTED   ng/mg creat    Sources of hydrocodone include scheduled prescription medications.    Norhydrocodone is an expected metabolite of hydrocodone.    Gabapentin                     PRESENT      EXPECTED   Lamotrigine                    PRESENT      EXPECTED   Citalopram                     PRESENT      EXPECTED   Desmethylcitalopram            PRESENT      EXPECTED    Desmethylcitalopram is an expected metabolite of citalopram or the    enantiomeric form, escitalopram.    Aripiprazole                   PRESENT      EXPECTED   Acetaminophen                  PRESENT      EXPECTED  Drug Present not Declared for Prescription Verification   Naproxen                       PRESENT      UNEXPECTED  Drug Absent but Declared for Prescription Verification   Mirtazapine                    Not Detected UNEXPECTED   Propranolol                    Not Detected UNEXPECTED ==================================================================== Test                      Result    Flag   Units      Ref Range   Creatinine              25               mg/dL      >=20 ==================================================================== Declared Medications:  The flagging and interpretation on this report are based on the  following declared medications.  Unexpected results  may arise from  inaccuracies in the declared medications.   **Note: The testing scope of this panel includes these medications:   Aripiprazole (  Abilify)  Citalopram (Celexa)  Gabapentin  Hydrocodone (Norco)  Lamotrigine (Lamictal)  Mirtazapine (Remeron)  Propranolol   **Note: The testing scope of this panel does not include small to  moderate amounts of these reported medications:   Acetaminophen (Tylenol)  Acetaminophen (Norco)   **Note: The testing scope of this panel does not include the  following reported medications:   Alendronate (Fosamax)  Azithromycin  Cetirizine (Zyrtec)  Clindamycin (Cleocin)  Dulaglutide (Trulicity)  Ertugliflozin (Steglatro)  Fluconazole (Diflucan)  Furosemide (Lasix)  Glimepiride  Hydrochlorothiazide  Insulin (Lantus)  Losartan (Cozaar)  Meclizine (Antivert)  Meloxicam (Mobic)  Metformin  Pantoprazole (Protonix)  Spironolactone  Vitamin B  Vitamin C ==================================================================== For clinical consultation, please call [phone]. ====================================================================      ROS  Constitutional: Denies any fever or chills Gastrointestinal: No reported hemesis, hematochezia, vomiting, or acute GI distress Musculoskeletal: Left hip pain Neurological: No reported episodes of acute onset apraxia, aphasia, dysarthria, agnosia, amnesia, paralysis, loss of coordination, or loss of consciousness  Medication Review  ARIPiprazole, B-complex with vitamin C, Dulaglutide, HYDROcodone-acetaminophen, acetaminophen, alendronate, amLODipine, bisacodyl, bisoprolol-hydrochlorothiazide, cetirizine, clindamycin, cloNIDine, ertugliflozin L-PyroglutamicAc, escitalopram, ferrous sulfate, furosemide, gabapentin, glimepiride, hydrOXYzine, hydrochlorothiazide, insulin glargine, lamoTRIgine, losartan, meclizine, meloxicam, metFORMIN, pantoprazole, and spironolactone  History Review   Allergy: Ms. Hillesheim is allergic to lithium, penicillins, and sulfa antibiotics. Drug: Ms. Shuford  reports previous drug use. Alcohol:  reports previous alcohol use. Tobacco:  reports that she quit smoking about 9 years ago. Her smoking use included cigarettes. She has never used smokeless tobacco. Social: Ms. Spratling  reports that she quit smoking about 9 years ago. Her smoking use included cigarettes. She has never used smokeless tobacco. She reports previous alcohol use. She reports previous drug use. Medical:  has a past medical history of Bipolar 1 disorder ([place]), Diabetes mellitus without complication ([place]), and Hypertension. Surgical: Ms. Mcmartin  has a past surgical history that includes Abdominal hysterectomy; Tonsillectomy; Cholecystectomy; Esophagogastroduodenoscopy (egd) with propofol (N/A, 06/23/2019); and Colonoscopy with propofol (N/A, 06/23/2019). Family: family history includes Bipolar disorder in her brother, paternal uncle, and paternal uncle; Mental illness in her father.  Laboratory Chemistry Profile   Renal Lab Results  Component Value Date   BUN 11 12/06/2019   CREATININE 0.59 12/06/2019   BCR 19 12/06/2019   GFRAA 114 12/06/2019   GFRNONAA 99 12/06/2019     Hepatic Lab Results  Component Value Date   AST 48 (H) 12/06/2019   ALT 47 (H) 12/06/2019   ALBUMIN 3.7 (L) 12/06/2019   ALKPHOS 122 (H) 12/06/2019     Electrolytes Lab Results  Component Value Date   NA 137 12/06/2019   K 3.9 12/06/2019   CL 101 12/06/2019   CALCIUM 9.7 12/06/2019     Bone No results found for: VD25OH, VD125OH2TOT, PP5093OI7, TI4580DX8, 25OHVITD1, 25OHVITD2, 25OHVITD3, TESTOFREE, TESTOSTERONE   Inflammation (CRP: Acute Phase) (ESR: Chronic Phase) No results found for: CRP, ESRSEDRATE, LATICACIDVEN     Note: Above Lab results reviewed.  Recent Imaging Review  DG Cervical Spine With Flex & Extend CLINICAL DATA:  Chronic neck and right shoulder pain without  known injury.  EXAM: CERVICAL SPINE COMPLETE WITH FLEXION AND EXTENSION VIEWS  COMPARISON:  None.  FINDINGS: Minimal grade 1 anterolisthesis of C4-5 is noted. No definite fracture is noted. No significant change in vertebral body alignment is noted on flexion or extension views. Mild to moderate degenerative disc disease is noted at C4-5, C5-6, C6-7 and C7-T1. Moderate bilateral neural foraminal stenosis is noted at C3-4, C4-5  and C5-6 secondary to uncovertebral spurring. Possible carotid artery calcifications are seen in the left cervical soft tissues.  IMPRESSION: Minimal grade 1 anterolisthesis of C4-5 is noted. Mild to moderate multilevel degenerative disc disease is noted. Moderate bilateral neural foraminal stenosis is noted secondary to uncovertebral spurring. No acute abnormality seen in the cervical spine.  Possible carotid artery calcifications seen in the left cervical soft tissues. Carotid ultrasound is recommended for further evaluation.  Electronically Signed   By: Marijo Conception M.D.   On: 09/19/2020 09:37 DG Shoulder Right CLINICAL DATA:  Patient c/o neck and right shoulder pain x years. With no trauma  EXAM: RIGHT SHOULDER - 2+ VIEW  COMPARISON:  None.  FINDINGS: There is no evidence of fracture or dislocation. There are mild degenerative changes at the glenohumeral and acromioclavicular joints. No aggressive osseous lesion. Regional soft tissues are unremarkable.  IMPRESSION: No acute osseous abnormality in the right shoulder. Mild glenohumeral and AC joint degenerative changes.  Electronically Signed   By: Audie Pinto M.D.   On: 09/19/2020 08:30 Note: Reviewed        Physical Exam  General appearance: Well nourished, well developed, and well hydrated. In no apparent acute distress Mental status: Alert, oriented x 3 (person, place, & time)       Respiratory: No evidence of acute respiratory distress Eyes: PERLA Vitals: BP (!) 142/55    Pulse 63   Temp (!) 97.4 F (36.3 C) (Temporal)   Resp 18   Ht 5' (1.524 m)   Wt 203 lb (92.1 kg)   SpO2 99%   BMI 39.65 kg/m  BMI: Estimated body mass index is 39.65 kg/m as calculated from the following:   Height as of this encounter: 5' (1.524 m).   Weight as of this encounter: 203 lb (92.1 kg). Ideal: Ideal body weight: 45.5 kg (100 lb 4.9 oz) Adjusted ideal body weight: 64.1 kg (141 lb 6.2 oz)   Lumbar Exam  Skin & Axial Inspection:No masses, redness, or swelling Alignment:Symmetrical Functional BVQ:XIHW restricted ROMaffecting both sides Stability:No instability detected Muscle Tone/Strength:Functionally intact. No obvious neuro-muscular anomalies detected. Sensory (Neurological):Musculoskeletal pain pattern and dermatomal pain pattern at left L4-L5  Provocative Tests: Hyperextension/rotation test:(+)bilaterally for facet joint pain. Lumbar quadrant test (Kemp's test):(+)bilaterally for facet joint pain. Lateral bending test:(+)due to pain.,  Positive straight leg raise test Patrick's Maneuver:deferred today FABER* test:deferred today S-I anterior distraction/compression test:deferred today S-I lateral compression test:deferred today S-I Thigh-thrust test:deferred today S-I Gaenslen's test:deferred today *(Flexion, ABduction and External Rotation)  Gait & Posture Assessment  Ambulation:Patient came in today in a wheel chair Gait:Limited. Using assistive device to ambulate Posture:Antalgic  Lower Extremity Exam    Side:Right lower extremity  Side:Left lower extremity  Stability:No instability observed  Stability:No instability observed  Skin & Extremity Inspection:Skin color, temperature, and hair growth are WNL. No peripheral edema or cyanosis. No masses, redness, swelling, asymmetry, or associated skin lesions. No contractures.  Skin &  Extremity Inspection:Skin color, temperature, and hair growth are WNL. No peripheral edema or cyanosis. No masses, redness, swelling, asymmetry, or associated skin lesions. No contractures.  Functional TUU:EKCM restricted ROMfor knee joint   Functional KLK:JZPH restricted ROMfor hip and knee joints, positive straight leg raise test   Muscle Tone/Strength:Functionally intact. No obvious neuro-muscular anomalies detected.  Muscle Tone/Strength:Functionally intact. No obvious neuro-muscular anomalies detected.  Sensory (Neurological):Arthropathic arthralgia  Sensory (Neurological):Arthropathic arthralgia and neurogenic  DTR: Patellar:deferred today Achilles:deferred today Plantar:deferred today  DTR: Patellar:deferred today Achilles:deferred today Plantar:deferred today  Palpation:No palpable anomalies  Palpation:No palpable anomalies   Assessment   Status Diagnosis  Controlled Controlled Controlled 1. Cervical facet joint syndrome   2. Cervical radicular pain   3. Bilateral primary osteoarthritis of knee   4. Encounter for long-term opiate analgesic use   5. Neck pain on right side   6. Pain in joint of right shoulder   7. Pain management contract signed   8. Lumbar facet arthropathy   9. Chronic pain syndrome       Plan of Care  Ms. Zionah Criswell has a current medication list which includes the following long-term medication(s): aripiprazole, bisoprolol-hydrochlorothiazide, cetirizine, clonidine, escitalopram, ferrous sulfate, furosemide, gabapentin, glimepiride, lamotrigine, losartan, losartan, metformin, pantoprazole, and spironolactone.  Pharmacotherapy (Medications Ordered): Meds ordered this encounter  Medications  . HYDROcodone-acetaminophen (NORCO) 10-325 MG tablet    Sig: Take 1 tablet by mouth 3 (three) times daily. For chronic pain syndrome. To last for 30 days from fill date.    Dispense:  90 tablet    Refill:  0     Ok to fill 1 day early if pharmacy closed on fill date  . HYDROcodone-acetaminophen (NORCO) 10-325 MG tablet    Sig: Take 1 tablet by mouth 3 (three) times daily. For chronic pain syndrome. To last for 30 days from fill date.    Dispense:  90 tablet    Refill:  0    Ok to fill 1 day early if pharmacy closed on fill date  . HYDROcodone-acetaminophen (NORCO) 10-325 MG tablet    Sig: Take 1 tablet by mouth 3 (three) times daily. For chronic pain syndrome. To last for 30 days from fill date.    Dispense:  90 tablet    Refill:  0    Ok to fill 1 day early if pharmacy closed on fill date    Follow-up plan:   Return in about 3 months (around 02/26/2021) for Medication Management, in person.      Interventional management options: Planned, scheduled, and/or pending:    Intra-articular knee Hyalgan series #1 done 08/28/20 helped significantly, repeat as needed   Considering:   Lumbar epidural steroid injection, lumbar facet medial branch nerve block pending lumbar MRI   PRN Procedures:   Repeat bilateral intra-articular Hyalgan injection       Recent Visits Date Type Provider Dept  09/18/20 Procedure visit Gillis Santa, MD [place] recent visits within past 90 days and meeting all other requirements Today's Visits Date Type Provider Dept  11/28/20 Office Visit Gillis Santa, MD [facility]  Showing today's visits and meeting all other requirements Future Appointments No visits were found meeting these conditions. Showing future appointments within next 90 days and meeting all other requirements  I discussed the assessment and treatment plan with the patient. The patient was provided an opportunity to ask questions and all were answered. The patient agreed with the plan and demonstrated an understanding of the instructions.  Patient advised to call back or seek an in-person evaluation if the symptoms or condition worsens.  Duration of encounter: 30  minutes.  Note by: Gillis Santa, MD Date: 11/28/2020; Time: 2:17 PM

## 2020-12-02 ENCOUNTER — Telehealth: Payer: Self-pay

## 2020-12-03 ENCOUNTER — Other Ambulatory Visit: Payer: Self-pay

## 2020-12-03 ENCOUNTER — Telehealth (INDEPENDENT_AMBULATORY_CARE_PROVIDER_SITE_OTHER): Payer: Medicaid Other | Admitting: Psychiatry

## 2020-12-03 ENCOUNTER — Encounter: Payer: Self-pay | Admitting: Psychiatry

## 2020-12-03 ENCOUNTER — Encounter: Payer: Medicaid Other | Admitting: Student in an Organized Health Care Education/Training Program

## 2020-12-03 DIAGNOSIS — F410 Panic disorder [episodic paroxysmal anxiety] without agoraphobia: Secondary | ICD-10-CM

## 2020-12-03 DIAGNOSIS — F3175 Bipolar disorder, in partial remission, most recent episode depressed: Secondary | ICD-10-CM | POA: Diagnosis not present

## 2020-12-03 DIAGNOSIS — Z634 Disappearance and death of family member: Secondary | ICD-10-CM | POA: Diagnosis not present

## 2020-12-03 DIAGNOSIS — G479 Sleep disorder, unspecified: Secondary | ICD-10-CM | POA: Diagnosis not present

## 2020-12-03 MED ORDER — ESCITALOPRAM OXALATE 10 MG PO TABS: 15.0000 mg | ORAL_TABLET | Freq: Every day | ORAL | 1 refills | Status: DC

## 2020-12-03 NOTE — Progress Notes (Signed)
Virtual Visit via Video Note  I connected with Margaret [name] on 12/03/20 at  2:00 PM EST by a video enabled telemedicine application and verified that I am speaking with the correct person using two identifiers.  Location Provider Location : [facility] Patient Location : Home  Participants: Patient , Provider   I discussed the limitations of evaluation and management by telemedicine and the availability of in person appointments. The patient expressed understanding and agreed to proceed.  I discussed the assessment and treatment plan with the patient. The patient was provided an opportunity to ask questions and all were answered. The patient agreed with the plan and demonstrated an understanding of the instructions.   The patient was advised to call back or seek an in-person evaluation if the symptoms worsen or if the condition fails to improve as anticipated.   [place] MD OP Progress Note  12/03/2020 2:52 PM Aubree Doody  MRN:  [id number]  Chief Complaint:  Chief Complaint    Follow-up     HPI: Margaret [name] is a 65 year old Caucasian female on disability, divorced, lives in [place], has a history of bipolar disorder, hyperlipidemia, bereavement, panic attacks, chronic pain, hypertension was evaluated by telemedicine today.  Patient today reports she is currently struggling with anxiety attacks.  She reports whenever she has to leave her house she goes into a panic mode.  She reports she has racing heart rate, feels nervous and has to sit down.  She reports she has to take her hydroxyzine when she feels that way and that helps her to relax in an hour or so.  Patient reports sleep is overall okay at this time.  She was referred for a sleep study last visit.  She however reports she wants to hold off on that since she feels her sleep is okay for now.  She reports if she tries to stay active during the day she is able to not doze off too much.  Patient denies any suicidality, homicidality or  perceptual disturbances.  Patient is compliant on her medications.  Patient continues to be in psychotherapy sessions on a monthly basis.  She reports therapy sessions as beneficial.  Patient denies any other concerns today.  Visit Diagnosis:    ICD-10-CM   1. Bipolar disorder, in partial remission, most recent episode depressed ([place])  F31.75   2. Bereavement  Z63.4   3. Panic attacks  F41.0 escitalopram (LEXAPRO) 10 MG tablet  4. Sleep disorder  G47.9     Past Psychiatric History: I have reviewed past psychiatric history from my progress note on 08/21/2019.  Past trials of Depakote, lithium, risperidone, Xanax  Past Medical History:  Past Medical History:  Diagnosis Date  . Bipolar 1 disorder ([place])   . Diabetes mellitus without complication (H[facility])    Type 2  . Hypertension     Past Surgical History:  Procedure Laterality Date  . ABDOMINAL HYSTERECTOMY    . CHOLECYSTECTOMY    . COLONOSCOPY WITH PROPOFOL N/A 06/23/2019   Procedure: COLONOSCOPY WITH PROPOFOL;  Surgeon: Jonathon Bellows, MD;  Location[facility] ENDOSCOPY;  Service: Gastroenterology;  Laterality: N/A;  . ESOPHAGOGASTRODUODENOSCOPY (EGD) WITH PROPOFOL N/A 06/23/2019   Procedure: ESOPHAGOGASTRODUODENOSCOPY (EGD) WITH PROPOFOL;  Surgeon: Jonathon Bellows, MD;  Location: [facility] ENDOSCOPY;  Service: Gastroenterology;  Laterality: N/A;  . TONSILLECTOMY      Family Psychiatric History: I have reviewed family psychiatric history from my progress note on 08/21/2019  Family History:  Family History  Problem Relation Age of Onset  .  Mental illness Father   . Bipolar disorder Brother   . Bipolar disorder Paternal Uncle   . Bipolar disorder Paternal Uncle     Social History: I have reviewed social history from my progress note on 08/21/2019 Social History   Socioeconomic History  . Marital status: Divorced    Spouse name: Not on file  . Number of children: 2  . Years of education: Not on file  . Highest education level: Not on  file  Occupational History  . Not on file  Tobacco Use  . Smoking status: Former Smoker    Types: Cigarettes    Quit date: 03/03/2011    Years since quitting: 9.7  . Smokeless tobacco: Never Used  Vaping Use  . Vaping Use: Never used  Substance and Sexual Activity  . Alcohol use: Not Currently  . Drug use: Not Currently  . Sexual activity: Not on file  Other Topics Concern  . Not on file  Social History Narrative  . Not on file   Social Determinants of Health   Financial Resource Strain: Not on file  Food Insecurity: Not on file  Transportation Needs: Not on file  Physical Activity: Not on file  Stress: Not on file  Social Connections: Not on file    Allergies:  Allergies  Allergen Reactions  . Lithium Nausea And Vomiting  . Penicillins Hives    Resulted in hospitalization  . Sulfa Antibiotics Hives    Resulted in hospitalization    Metabolic Disorder Labs: Lab Results  Component Value Date   HGBA1C 7.3 (H) 06/22/2019   MPG 162.81 06/22/2019   No results found for: PROLACTIN No results found for: CHOL, TRIG, HDL, CHOLHDL, VLDL, LDLCALC Lab Results  Component Value Date   TSH 1.737 11/16/2019    Therapeutic Level Labs: No results found for: LITHIUM No results found for: VALPROATE No components found for:  CBMZ  Current Medications: Current Outpatient Medications  Medication Sig Dispense Refill  . acetaminophen (TYLENOL) 500 MG tablet Take 500 mg by mouth every 6 (six) hours as needed.     Marland Kitchen alendronate (FOSAMAX) 35 MG tablet Take 35 mg by mouth every 7 (seven) days. Take with a full glass of water on an empty stomach.    Marland Kitchen amLODipine (NORVASC) 10 MG tablet Take 10 mg by mouth daily.    . ARIPiprazole (ABILIFY) 15 MG tablet Take 0.5 tablets (7.5 mg total) by mouth daily. 45 tablet 0  . B Complex-C (B-COMPLEX WITH VITAMIN C) tablet Take 1 tablet by mouth daily. 30 tablet 2  . bisacodyl (DULCOLAX) 5 MG EC tablet Take 5 mg by mouth daily as needed for  moderate constipation.    . bisoprolol-hydrochlorothiazide (ZIAC) 5-6.25 MG tablet Take 1 tablet by mouth daily.    . cetirizine (ZYRTEC) 10 MG tablet Take 10 mg by mouth daily.    . clindamycin (CLEOCIN) 150 MG capsule TAKE 4 CAPSULES 1 HOURABEFORE DENTAL APPOINTMENT.    . cloNIDine (CATAPRES) 0.1 MG tablet clonidine HCl 0.1 mg tablet  TAKE 1 TABLET BY MOUTH 2 TIMES A DAY    . Dulaglutide (TRULICITY) A999333 0000000 SOPN Inject 0.75 mg into the skin once a week. Thursday    . Ertugliflozin L-PyroglutamicAc (STEGLATRO) 15 MG TABS Take by mouth.     . escitalopram (LEXAPRO) 10 MG tablet Take 1.5 tablets (15 mg total) by mouth daily. 45 tablet 1  . ferrous sulfate 325 (65 FE) MG tablet Take 1 tablet (325 mg total) by mouth  2 (two) times daily with a meal. 120 tablet 0  . furosemide (LASIX) 20 MG tablet Take 10 mg by mouth daily as needed.    . gabapentin (NEURONTIN) 600 MG tablet TAKE (1) TABLET BY MOUTH (3) TIMES DAILY. 270 tablet 0  . glimepiride (AMARYL) 2 MG tablet Take 2 mg by mouth daily with breakfast.     . hydrochlorothiazide (HYDRODIURIL) 25 MG tablet Take 25 mg by mouth daily.     Derrill Memo ON 12/08/2020] HYDROcodone-acetaminophen (NORCO) 10-325 MG tablet Take 1 tablet by mouth 3 (three) times daily. For chronic pain syndrome. To last for 30 days from fill date. 90 tablet 0  . [START ON 01/07/2021] HYDROcodone-acetaminophen (NORCO) 10-325 MG tablet Take 1 tablet by mouth 3 (three) times daily. For chronic pain syndrome. To last for 30 days from fill date. 90 tablet 0  . [START ON 02/06/2021] HYDROcodone-acetaminophen (NORCO) 10-325 MG tablet Take 1 tablet by mouth 3 (three) times daily. For chronic pain syndrome. To last for 30 days from fill date. 90 tablet 0  . hydrOXYzine (VISTARIL) 25 MG capsule Take 1-2 capsules (25-50 mg total) by mouth daily as needed for anxiety. Limit use - take only for severe panic attacks 60 capsule 1  . Insulin Glargine (LANTUS SOLOSTAR) 100 UNIT/ML Solostar Pen Inject  60 Units into the skin at bedtime.     . lamoTRIgine (LAMICTAL) 150 MG tablet Take 1 tablet (150 mg total) by mouth 2 (two) times daily. 60 tablet 1  . LANTUS SOLOSTAR 100 UNIT/ML Solostar Pen SMARTSIG:60 Unit(s) SUB-Q Every Night    . losartan (COZAAR) 25 MG tablet Take 25 mg by mouth daily. Takes differently    . losartan (COZAAR) 50 MG tablet Take 50 mg by mouth daily.    . meclizine (ANTIVERT) 25 MG tablet Take 25 mg by mouth 2 (two) times daily as needed.    . meloxicam (MOBIC) 7.5 MG tablet TAKE 1 TABLET BY MOUTH EVERY DAY    . metFORMIN (GLUCOPHAGE) 500 MG tablet Take 500 mg by mouth 2 (two) times daily with a meal.    . pantoprazole (PROTONIX) 40 MG tablet Take 40 mg by mouth daily.    Marland Kitchen spironolactone (ALDACTONE) 25 MG tablet Take 25 mg by mouth daily.     No current facility-administered medications for this visit.     Musculoskeletal: Strength & Muscle Tone: UTA Gait & Station: UTA Patient leans: N/A  Psychiatric Specialty Exam: Review of Systems  Psychiatric/Behavioral: The patient is nervous/anxious.   All other systems reviewed and are negative.   There were no vitals taken for this visit.There is no height or weight on file to calculate BMI.  General Appearance: Casual  Eye Contact:  Fair  Speech:  Normal Rate  Volume:  Normal  Mood:  Anxious  Affect:  Congruent  Thought Process:  Goal Directed and Descriptions of Associations: Intact  Orientation:  Full (Time, Place, and Person)  Thought Content: Logical   Suicidal Thoughts:  No  Homicidal Thoughts:  No  Memory:  Immediate;   Fair Recent;   Fair Remote;   Fair  Judgement:  Fair  Insight:  Fair  Psychomotor Activity:  Normal  Concentration:  Concentration: Fair and Attention Span: Fair  Recall:  [employer] of Knowledge: Fair  Language: Fair  Akathisia:  No  Handed:  Right  AIMS (if indicated): UTA  Assets:  Communication Skills Desire for Improvement Housing Social Support  ADL's:  Intact   Cognition: WNL  Sleep:  Fair   Screenings: PHQ2-9   Flowsheet Row Procedure visit from 09/18/2020 in [facility] Procedure visit from 08/28/2020 in [facility]  PHQ-2 Total Score 0 0       Assessment and Plan: Margaret [name] is a 65 year old Caucasian female on disability, divorced, currently lives in [place], has a history of bipolar disorder, chronic pain, insomnia, diabetes melitis, hypertension, hyperlipidemia was evaluated by telemedicine today.  Patient is biologically predisposed given her family history of mental health problems, history of trauma.  Patient with psychosocial stressors of mother's death, current pandemic, daughter's relocation.  Patient is currently struggling with panic attacks.  Plan as noted below.  Plan Bipolar disorder depressed-in partial remission Lamotrigine 150 mg p.o. twice daily Abilify 7.5 mg p.o. daily Gabapentin 600 mg p.o. 3 times daily  Panic attacks-unstable Patient advised to have psychotherapy sessions more frequently.  She will have a discussion with her therapist Ms. Zadie Rhine Continue hydroxyzine 25-50 mg p.o. daily as needed for severe panic attacks Increase Lexapro to 15 mg p.o. daily with supper She also has propranolol available as needed.  Sleep disorder-improving Patient was referred for sleep study-noncompliant.  She wants to hold off. She will let writer know if she is interested.  She currently reports sleep is improving.  Follow-up in clinic in 2 to 3 weeks or sooner if needed.   I have spent atleast 20 minutes face to face by video with patient today. More than 50 % of the time was spent for preparing to see the patient ( e.g., review of test, records ), ordering medications and test ,psychoeducation and supportive psychotherapy and care coordination,as well as documenting clinical information in electronic health record. This note  was generated in part or whole with voice recognition software. Voice recognition is usually quite accurate but there are transcription errors that can and very often do occur. I apologize for any typographical errors that were not detected and corrected.        Ursula Alert, MD 12/03/2020, 2:52 PM

## 2020-12-09 ENCOUNTER — Other Ambulatory Visit: Payer: Self-pay

## 2020-12-09 ENCOUNTER — Encounter: Payer: Self-pay | Admitting: Licensed Clinical Social Worker

## 2020-12-09 ENCOUNTER — Ambulatory Visit (INDEPENDENT_AMBULATORY_CARE_PROVIDER_SITE_OTHER): Payer: Medicaid Other | Admitting: Licensed Clinical Social Worker

## 2020-12-09 ENCOUNTER — Telehealth: Payer: Self-pay

## 2020-12-09 DIAGNOSIS — F3175 Bipolar disorder, in partial remission, most recent episode depressed: Secondary | ICD-10-CM

## 2020-12-09 DIAGNOSIS — F419 Anxiety disorder, unspecified: Secondary | ICD-10-CM | POA: Diagnosis not present

## 2020-12-09 DIAGNOSIS — F410 Panic disorder [episodic paroxysmal anxiety] without agoraphobia: Secondary | ICD-10-CM | POA: Diagnosis not present

## 2020-12-09 NOTE — Progress Notes (Signed)
Virtual Visit via Video Note  I connected with Margaret [name] on 12/09/20 at 11:00 AM EST by a video enabled telemedicine application and verified that I am speaking with the correct person using two identifiers.  Participating Parties Patient Provider  Location: Patient: Home Provider: Home Office   I discussed the limitations of evaluation and management by telemedicine and the availability of in person appointments. The patient expressed understanding and agreed to proceed.  THERAPY PROGRESS NOTE  Session Time: 30 Minutes  Participation Level: Active  Behavioral Response: Neat and Well GroomedAlertAnxious  Type of Therapy: Individual Therapy  Treatment Goals addressed: Anxiety and Coping  Interventions: CBT  Summary: Margaret [name] is a 65 y.o. female who presents with depression and anxiety sxs. Pt reported "long story short I am having a lot of anxiety feelings. Dr. Shea Evans put me on hydroxyzine for now". Pt reported that she experiences anxiety as tightness in the body and "uneasiness" which has been occurring daily mostly in the mornings. Pt reported this is even on days "I didn't have to do anything. My pajama days". Pt reported "no crying spells. That is over". Pt reported that she has decided "for the next 2 weeks not to schedule any appointments" to see if that has been the trigger for anxiety. Pt reported "when I take the medicine, I feel more relaxed. I talk to friends and give advice. My mind is occupied". Pt acknowledged "I am hard on myself" and a need to be "in control". After returning from patient's established "no appointment" break, patient agrees to meet more frequently than once per month.  Suicidal/Homicidal: No  Therapist Response: Therapist met with patient for follow up. Therapist and patient explored continued anxiety sxs and potential triggers. Therapist provided psychoeducation on use of relaxation and other mindfulness skills. Therapist and patient reviewed  PHQ-9 and C-SRSS. Therapist assigned patient homework to review and practice coping strategies discussed between now and next session. Pt was receptive.   Plan: Return again in 1 month.  Diagnosis: Axis I: Anxiety Disorder NOS, Bipolar, Depressed and Panic Attacks    Axis II: N/A  Josephine Igo, LCSW, LCAS 12/09/2020

## 2020-12-09 NOTE — Telephone Encounter (Signed)
[id number]  PA sent via [facility]

## 2020-12-09 NOTE — Telephone Encounter (Signed)
Pt states that she needs a prior auth. Sot hat her meds are $3 instead of $30

## 2020-12-10 NOTE — Telephone Encounter (Signed)
error 

## 2020-12-11 ENCOUNTER — Telehealth: Payer: Self-pay | Admitting: *Deleted

## 2020-12-11 NOTE — Telephone Encounter (Signed)
Called patient to let her know that we have received approval for hydrocodone - apap 10-325 mg

## 2020-12-23 ENCOUNTER — Encounter: Payer: Self-pay | Admitting: Psychiatry

## 2020-12-23 ENCOUNTER — Telehealth (INDEPENDENT_AMBULATORY_CARE_PROVIDER_SITE_OTHER): Payer: Medicaid Other | Admitting: Psychiatry

## 2020-12-23 ENCOUNTER — Other Ambulatory Visit: Payer: Self-pay

## 2020-12-23 DIAGNOSIS — Z634 Disappearance and death of family member: Secondary | ICD-10-CM | POA: Diagnosis not present

## 2020-12-23 DIAGNOSIS — F410 Panic disorder [episodic paroxysmal anxiety] without agoraphobia: Secondary | ICD-10-CM

## 2020-12-23 DIAGNOSIS — F3176 Bipolar disorder, in full remission, most recent episode depressed: Secondary | ICD-10-CM | POA: Diagnosis not present

## 2020-12-23 MED ORDER — LAMOTRIGINE 200 MG PO TABS: 200.0000 mg | ORAL_TABLET | Freq: Every day | ORAL | 0 refills | Status: DC

## 2020-12-23 NOTE — Progress Notes (Signed)
Virtual Visit via Video Note  I connected with Margaret [name] on 12/23/20 at  1:00 PM EST by a video enabled telemedicine application and verified that I am speaking with the correct person using two identifiers.  Location Provider Location : [facility] Patient Location : Home  Participants: Patient , Provider    I discussed the limitations of evaluation and management by telemedicine and the availability of in person appointments. The patient expressed understanding and agreed to proceed.   I discussed the assessment and treatment plan with the patient. The patient was provided an opportunity to ask questions and all were answered. The patient agreed with the plan and demonstrated an understanding of the instructions.   The patient was advised to call back or seek an in-person evaluation if the symptoms worsen or if the condition fails to improve as anticipated.   [place] MD OP Progress Note  12/23/2020 8:37 PM Margaret [name]  MRN:  [id number]  Chief Complaint:  Chief Complaint    Follow-up     HPI: Margaret [name] is a 65 year old Caucasian female on disability, divorced, lives in [place], has a history of bipolar disorder, hyperlipidemia, bereavement, panic attacks, chronic pain, was evaluated by telemedicine today.  Patient today reports she is currently doing well with regards to her anxiety and panic symptoms.  The higher dosage of Lexapro has definitely helped.  She denies any side effects.  Patient also reports she continues to take hydroxyzine at least once a day which helps with her anxiety attacks.  Patient denies any side effects to these medications.  Patient denies any significant mood swings, hypomanic, manic symptoms or depressive symptoms at this time.  She is compliant on her medications except for the Lamictal which she takes at reduced dose of 200 mg daily.  She denies any suicidality, homicidality or perceptual disturbances.  Patient continues to be in psychotherapy  sessions and reports therapy sessions as beneficial.  Patient denies any other concerns today.  Visit Diagnosis:    ICD-10-CM   1. Bipolar disorder, in full remission, most recent episode depressed (HCC)  F31.76 lamoTRIgine (LAMICTAL) 200 MG tablet  2. Bereavement  Z63.4   3. Panic attacks  F41.0 lamoTRIgine (LAMICTAL) 200 MG tablet    Past Psychiatric History: I have reviewed past psychiatric history from my progress note on 08/21/2019.  Past trials of Depakote, lithium, risperidone, Xanax  Past Medical History:  Past Medical History:  Diagnosis Date  . Bipolar 1 disorder ([place])   . Diabetes mellitus without complication (HCC)    Type 2  . Hypertension     Past Surgical History:  Procedure Laterality Date  . ABDOMINAL HYSTERECTOMY    . CHOLECYSTECTOMY    . COLONOSCOPY WITH PROPOFOL N/A 06/23/2019   Procedure: COLONOSCOPY WITH PROPOFOL;  Surgeon: Jonathon Bellows, MD;  Location: [facility] ENDOSCOPY;  Service: Gastroenterology;  Laterality: N/A;  . ESOPHAGOGASTRODUODENOSCOPY (EGD) WITH PROPOFOL N/A 06/23/2019   Procedure: ESOPHAGOGASTRODUODENOSCOPY (EGD) WITH PROPOFOL;  Surgeon: Jonathon Bellows, MD;  Location: [facility] ENDOSCOPY;  Service: Gastroenterology;  Laterality: N/A;  . TONSILLECTOMY      Family Psychiatric History: I have reviewed family psychiatric history from my progress note on 08/21/2019  Family History:  Family History  Problem Relation Age of Onset  . Mental illness Father   . Bipolar disorder Brother   . Bipolar disorder Paternal Uncle   . Bipolar disorder Paternal Uncle     Social History: Reviewed social history from my progress note on 08/21/2019 Social History   Socioeconomic History  .  Marital status: Divorced    Spouse name: Not on file  . Number of children: 2  . Years of education: Not on file  . Highest education level: Not on file  Occupational History  . Not on file  Tobacco Use  . Smoking status: Former Smoker    Types: Cigarettes    Quit date: 03/03/2011     Years since quitting: 9.8  . Smokeless tobacco: Never Used  Vaping Use  . Vaping Use: Never used  Substance and Sexual Activity  . Alcohol use: Not Currently  . Drug use: Not Currently  . Sexual activity: Not on file  Other Topics Concern  . Not on file  Social History Narrative  . Not on file   Social Determinants of Health   Financial Resource Strain: Not on file  Food Insecurity: Not on file  Transportation Needs: Not on file  Physical Activity: Not on file  Stress: Not on file  Social Connections: Not on file    Allergies:  Allergies  Allergen Reactions  . Lithium Nausea And Vomiting  . Penicillins Hives    Resulted in hospitalization  . Sulfa Antibiotics Hives    Resulted in hospitalization    Metabolic Disorder Labs: Lab Results  Component Value Date   HGBA1C 7.3 (H) 06/22/2019   MPG 162.81 06/22/2019   No results found for: PROLACTIN No results found for: CHOL, TRIG, HDL, CHOLHDL, VLDL, LDLCALC Lab Results  Component Value Date   TSH 1.737 11/16/2019    Therapeutic Level Labs: No results found for: LITHIUM No results found for: VALPROATE No components found for:  CBMZ  Current Medications: Current Outpatient Medications  Medication Sig Dispense Refill  . acetaminophen (TYLENOL) 500 MG tablet Take 500 mg by mouth every 6 (six) hours as needed.     Marland Kitchen alendronate (FOSAMAX) 35 MG tablet Take 35 mg by mouth every 7 (seven) days. Take with a full glass of water on an empty stomach.    Marland Kitchen amLODipine (NORVASC) 5 MG tablet Take by mouth.    . ARIPiprazole (ABILIFY) 15 MG tablet Take 0.5 tablets (7.5 mg total) by mouth daily. 45 tablet 0  . B Complex-C (B-COMPLEX WITH VITAMIN C) tablet Take 1 tablet by mouth daily. 30 tablet 2  . bisacodyl (DULCOLAX) 5 MG EC tablet Take 5 mg by mouth daily as needed for moderate constipation.    . bisoprolol-hydrochlorothiazide (ZIAC) 5-6.25 MG tablet Take 1 tablet by mouth daily.    . cetirizine (ZYRTEC) 10 MG tablet Take  10 mg by mouth daily.    . cloNIDine (CATAPRES) 0.1 MG tablet clonidine HCl 0.1 mg tablet  TAKE 1 TABLET BY MOUTH 2 TIMES A DAY    . cyclobenzaprine (FLEXERIL) 10 MG tablet TAKE 1 TABLET BY MOUTHATHREE TIMES A DAY AS NEEDED.    . Dulaglutide (TRULICITY) 2.94 TM/5.4YT SOPN Inject 0.75 mg into the skin once a week. Thursday    . escitalopram (LEXAPRO) 10 MG tablet Take 1.5 tablets (15 mg total) by mouth daily. 45 tablet 1  . furosemide (LASIX) 20 MG tablet Take 10 mg by mouth daily as needed.    . gabapentin (NEURONTIN) 600 MG tablet TAKE (1) TABLET BY MOUTH (3) TIMES DAILY. 270 tablet 0  . glimepiride (AMARYL) 2 MG tablet Take 2 mg by mouth daily with breakfast.     . HYDROcodone-acetaminophen (NORCO) 10-325 MG tablet Take 1 tablet by mouth 3 (three) times daily. For chronic pain syndrome. To last for 30 days from  fill date. 90 tablet 0  . [START ON 01/07/2021] HYDROcodone-acetaminophen (NORCO) 10-325 MG tablet Take 1 tablet by mouth 3 (three) times daily. For chronic pain syndrome. To last for 30 days from fill date. 90 tablet 0  . [START ON 02/06/2021] HYDROcodone-acetaminophen (NORCO) 10-325 MG tablet Take 1 tablet by mouth 3 (three) times daily. For chronic pain syndrome. To last for 30 days from fill date. 90 tablet 0  . Insulin Glargine (LANTUS SOLOSTAR) 100 UNIT/ML Solostar Pen Inject 60 Units into the skin at bedtime.     . insulin glargine (LANTUS) 100 UNIT/ML Solostar Pen Inject into the skin.    Marland Kitchen lamoTRIgine (LAMICTAL) 200 MG tablet Take 1 tablet (200 mg total) by mouth daily. 90 tablet 0  . LANTUS SOLOSTAR 100 UNIT/ML Solostar Pen SMARTSIG:60 Unit(s) SUB-Q Every Night    . meclizine (ANTIVERT) 25 MG tablet Take 25 mg by mouth 2 (two) times daily as needed.    . meloxicam (MOBIC) 7.5 MG tablet TAKE 1 TABLET BY MOUTH EVERY DAY    . metFORMIN (GLUCOPHAGE) 500 MG tablet Take 500 mg by mouth 2 (two) times daily with a meal.    . predniSONE (STERAPRED UNI-PAK 21 TAB) 5 MG (21) TBPK tablet Take  by mouth as directed.    Marland Kitchen spironolactone (ALDACTONE) 25 MG tablet Take 25 mg by mouth daily.    Marland Kitchen amLODipine (NORVASC) 10 MG tablet Take 10 mg by mouth daily. (Patient not taking: Reported on 12/23/2020)    . clindamycin (CLEOCIN) 150 MG capsule TAKE 4 CAPSULES 1 [place]. (Patient not taking: Reported on 12/23/2020)    . Ertugliflozin L-PyroglutamicAc (STEGLATRO) 15 MG TABS Take by mouth.  (Patient not taking: Reported on 12/23/2020)    . ferrous sulfate 325 (65 FE) MG tablet Take 1 tablet (325 mg total) by mouth 2 (two) times daily with a meal. 120 tablet 0  . hydrochlorothiazide (HYDRODIURIL) 25 MG tablet Take 25 mg by mouth daily.  (Patient not taking: Reported on 12/23/2020)    . hydrOXYzine (VISTARIL) 25 MG capsule Take 1-2 capsules (25-50 mg total) by mouth daily as needed for anxiety. Limit use - take only for severe panic attacks 60 capsule 1  . losartan (COZAAR) 25 MG tablet Take 25 mg by mouth daily. Takes differently (Patient not taking: Reported on 12/23/2020)    . losartan (COZAAR) 50 MG tablet Take 50 mg by mouth daily. (Patient not taking: Reported on 12/23/2020)    . pantoprazole (PROTONIX) 40 MG tablet Take 40 mg by mouth daily. (Patient not taking: Reported on 12/23/2020)     No current facility-administered medications for this visit.     Musculoskeletal: Strength & Muscle Tone: UTA Gait & Station: normal Patient leans: N/A  Psychiatric Specialty Exam: Review of Systems  Psychiatric/Behavioral: Negative for agitation, behavioral problems, confusion, decreased concentration, dysphoric mood, hallucinations, self-injury, sleep disturbance and suicidal ideas. The patient is nervous/anxious. The patient is not hyperactive.   All other systems reviewed and are negative.   There were no vitals taken for this visit.There is no height or weight on file to calculate BMI.  General Appearance: Casual  Eye Contact:  Fair  Speech:  Clear and Coherent  Volume:   Normal  Mood:  Anxious  Affect:  Congruent  Thought Process:  Goal Directed and Descriptions of Associations: Intact  Orientation:  Full (Time, Place, and Person)  Thought Content: Logical   Suicidal Thoughts:  No  Homicidal Thoughts:  No  Memory:  Immediate;  Fair Recent;   Fair Remote;   fair  Judgement:  Fair  Insight:  Fair  Psychomotor Activity:  Normal  Concentration:  Concentration: Fair and Attention Span: Fair  Recall:  [employer] of Knowledge: Fair  Language: Fair  Akathisia:  No  Handed:  Right  AIMS (if indicated): UTA  Assets:  Communication Skills Desire for Improvement Housing Social Support  ADL's:  Intact  Cognition: WNL  Sleep:  Fair   Screenings: PHQ2-9   Flowsheet Row Video Visit from 12/23/2020 in [facility] Counselor from 12/09/2020 in [facility] Procedure visit from 09/18/2020 in [facility] Procedure visit from 08/28/2020 in [facility]  PHQ-2 Total Score 0 5 0 0  PHQ-9 Total Score - 15 - -    Flowsheet Row Video Visit from 12/23/2020 in [facility] Counselor from 12/09/2020 in [place] No Risk No Risk       Assessment and Plan: Kimarie Coor is a 65 year old Caucasian female on disability, divorced, currently lives in [place], has a history of bipolar disorder, chronic pain, insomnia, diabetes, hypertension, hyperlipidemia was evaluated by telemedicine today.  She is biologically predisposed given her family history, history of trauma.  Patient with psychosocial stressors of the current pandemic, daughter's relocation.  She however is currently making progress.  Plan as noted below.  Plan  Bipolar disorder depressed in remission Reduce lamotrigine to 200 mg p.o. daily since patient as been noncompliant . Patient although  prescribed 300 mg daily was taking the lower dosage. Abilify 7.5 mg p.o. daily Gabapentin 600 mg p.o. 3 times daily  Panic attacks-improving Continue hydroxyzine 25-50 mg p.o. daily as needed for severe panic symptoms Advised patient to not use cetrizine the days that she use hydroxyzine due to drug to drug interaction. Continue Lexapro 15 mg p.o. daily with supper  For bereavement-improving Continue CBT  Follow-up in clinic in 4 weeks or sooner if needed.  I have spent atleast 20 minutes face to face by video with patient today. More than 50 % of the time was spent for preparing to see the patient ( e.g., review of test, records ),ordering medications and test ,psychoeducation and supportive psychotherapy and care coordination,as well as documenting clinical information in electronic health record. This note was generated in part or whole with voice recognition software. Voice recognition is usually quite accurate but there are transcription errors that can and very often do occur. I apologize for any typographical errors that were not detected and corrected.         Ursula Alert, MD 12/23/2020, 8:37 PM

## 2020-12-25 ENCOUNTER — Ambulatory Visit (HOSPITAL_COMMUNITY)
Admission: RE | Admit: 2020-12-25 | Discharge: 2020-12-25 | Disposition: A | Discharge status: Home / Self Care | Payer: Medicaid Other | Source: Ambulatory Visit | Attending: Internal Medicine | Admitting: Internal Medicine

## 2020-12-25 ENCOUNTER — Other Ambulatory Visit: Payer: Self-pay

## 2020-12-25 DIAGNOSIS — I6523 Occlusion and stenosis of bilateral carotid arteries: Secondary | ICD-10-CM | POA: Insufficient documentation

## 2020-12-25 IMAGING — US US CAROTID DUPLEX BILAT
1 series · 13 of 24 positions shown · non-contrast
Comparison: None.

CLINICAL DATA: Carotid atherosclerosis, hypertension, diabetes

EXAM:
BILATERAL CAROTID DUPLEX ULTRASOUND
TECHNIQUE: Gray scale imaging, color Doppler and duplex ultrasound were
performed of bilateral carotid and vertebral arteries in the neck.

[Series 1: us carotid bilateral · 13 of 68 slices shown]
[im 1/68]
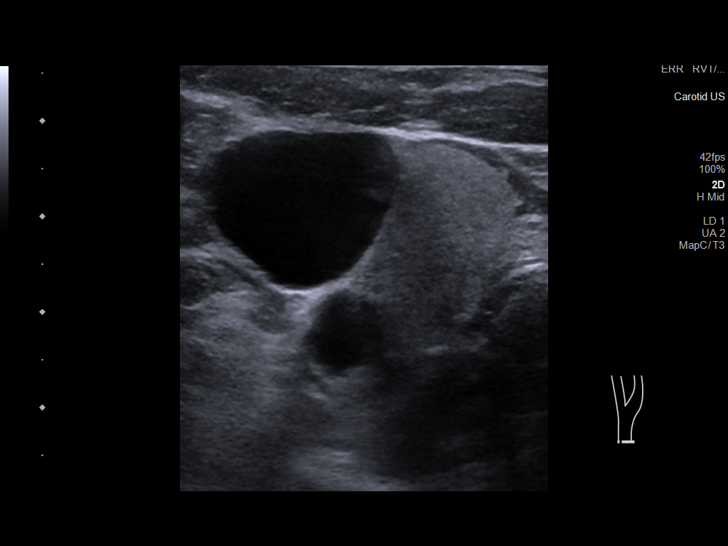
[im 6/68]
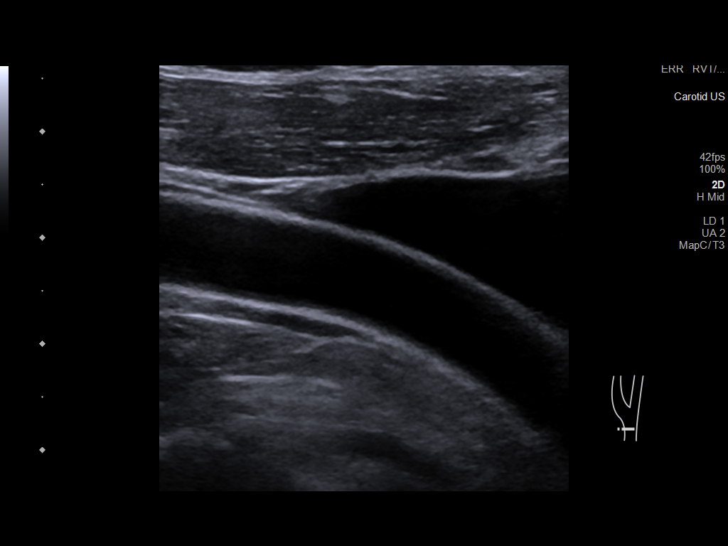
[im 12/68]
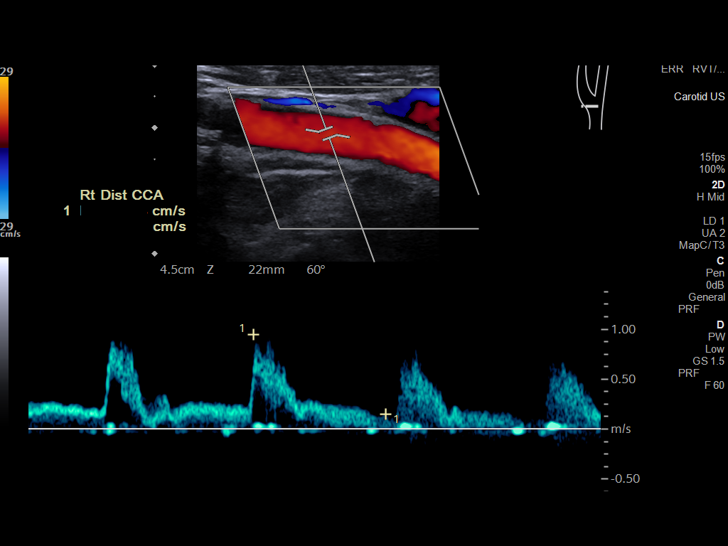
[im 18/68]
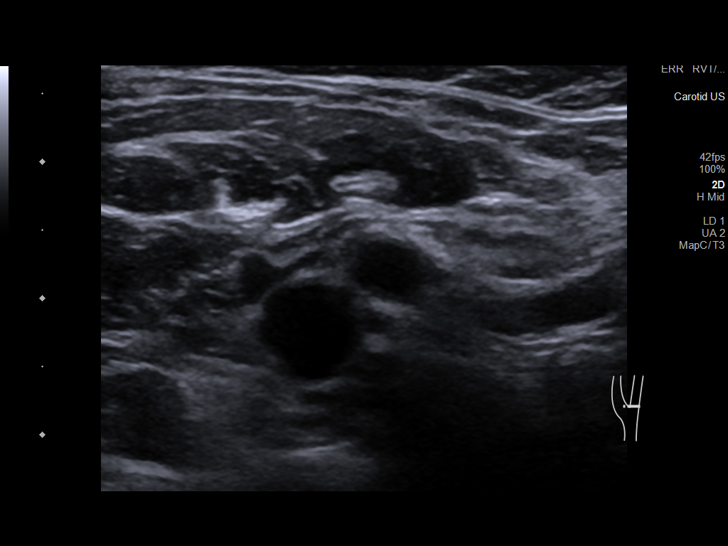
[im 24/68]
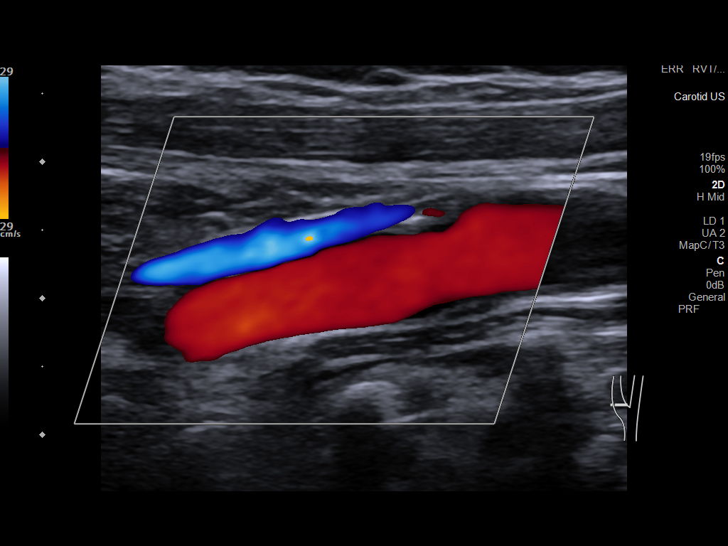
[im 30/68]
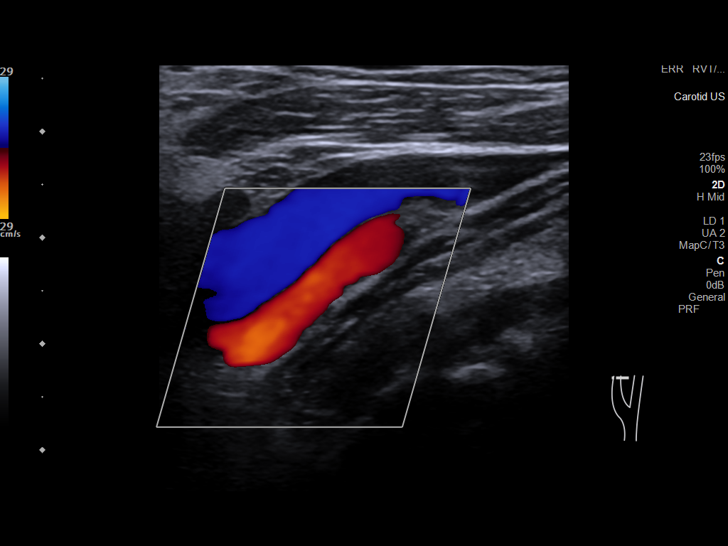
[im 35/68]
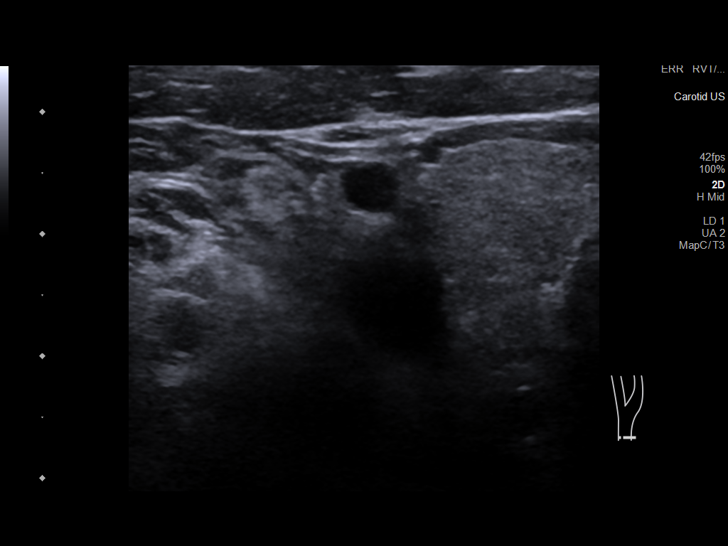
[im 38/68]
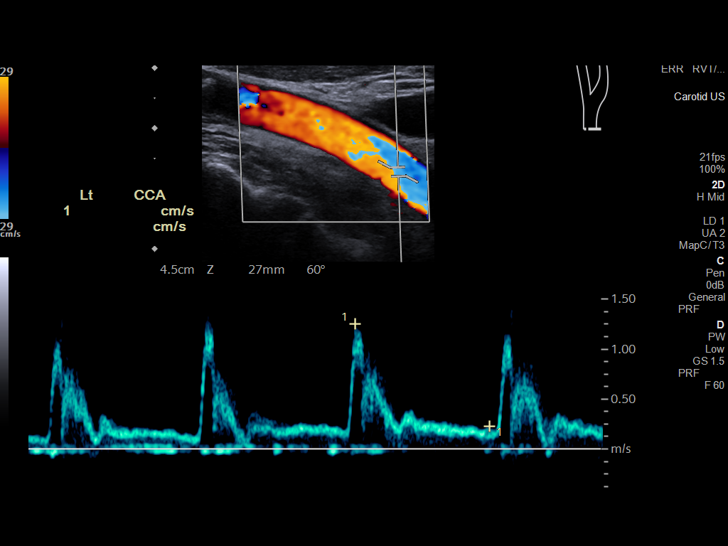
[im 44/68]
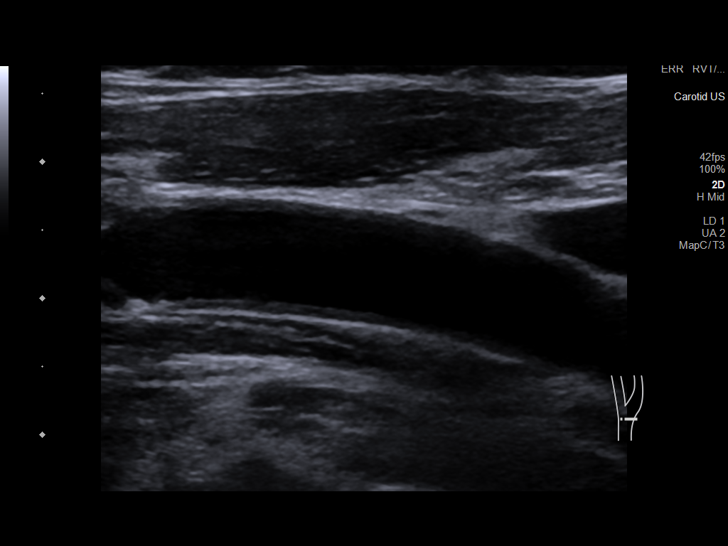
[im 50/68]
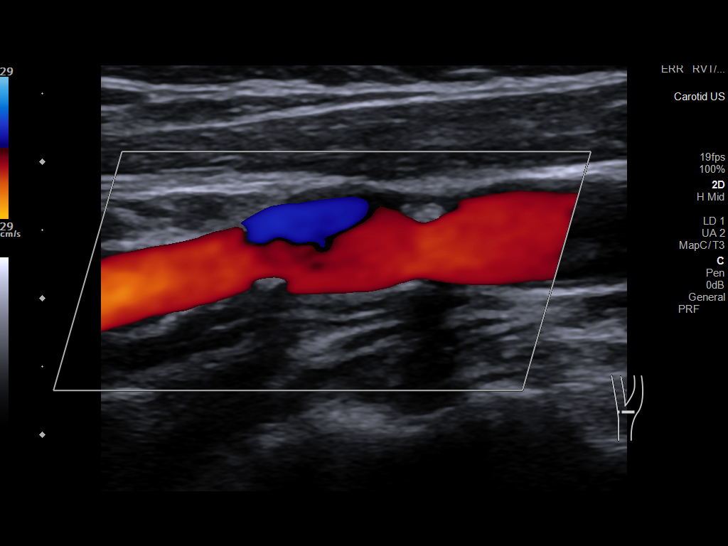
[im 56/68]
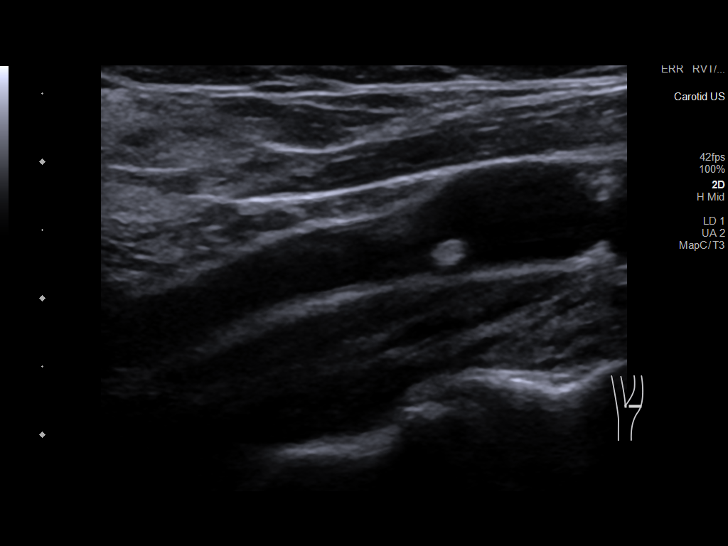
[im 62/68]
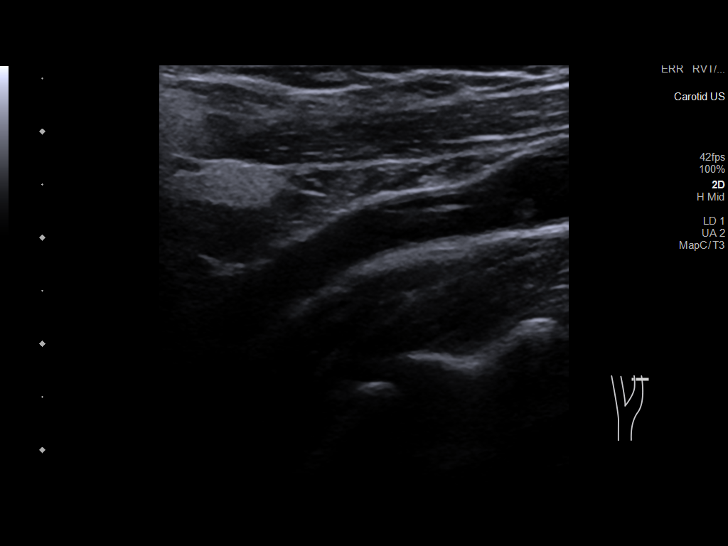
[im 68/68]
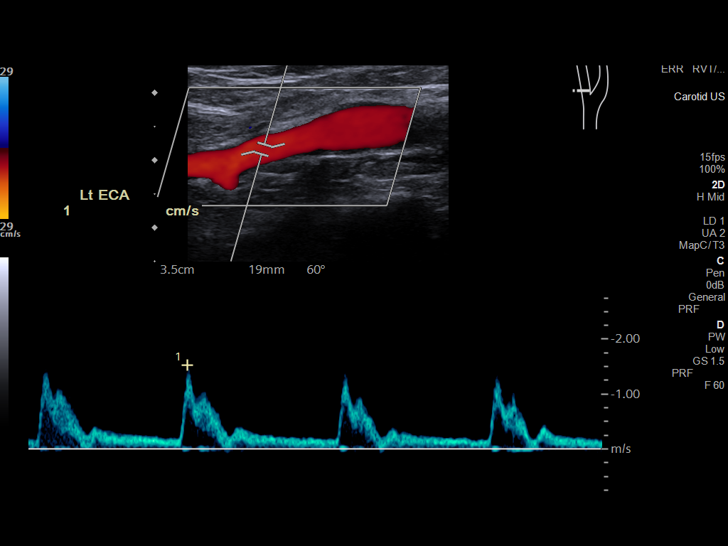

[13 of 24 positions shown; findings below may reference images not displayed]

FINDINGS: Criteria: Quantification of carotid stenosis is based on velocity
parameters that correlate the residual internal carotid diameter
with NASCET-based stenosis levels, using the diameter of the distal
internal carotid lumen as the denominator for stenosis measurement.

The following velocity measurements were obtained:

RIGHT

ICA: 130/38 cm/sec

CCA: 121/26 cm/sec

SYSTOLIC ICA/CCA RATIO:

ECA: 226 cm/sec

LEFT

ICA: 175/41 cm/sec

CCA: 115/24 cm/sec

SYSTOLIC ICA/CCA RATIO:

ECA: 153 cm/sec

RIGHT CAROTID ARTERY: Diffuse mild intimal thickening and minor
atherosclerotic change. No hemodynamically significant right ICA
stenosis, velocity elevation, turbulent flow. Degree of narrowing
less than 50% by ultrasound criteria.

RIGHT VERTEBRAL ARTERY:  Normal antegrade flow

LEFT CAROTID ARTERY: Similar intimal thickening and minor
atherosclerotic change. Slight distal ICA velocity elevation as
above but no significant grayscale abnormality, luminal narrowing,
turbulent flow, or spectral broadening. Degree of stenosis still
estimated less than 50%.

LEFT VERTEBRAL ARTERY:  Normal antegrade flow
IMPRESSION: Bilateral carotid atherosclerosis. No hemodynamically significant
ICA stenosis. Degree of narrowing less than 50% bilaterally by
ultrasound criteria.

Patent antegrade vertebral flow bilaterally

## 2020-12-30 ENCOUNTER — Other Ambulatory Visit: Payer: Self-pay | Admitting: Psychiatry

## 2020-12-30 DIAGNOSIS — F410 Panic disorder [episodic paroxysmal anxiety] without agoraphobia: Secondary | ICD-10-CM

## 2021-01-06 ENCOUNTER — Encounter: Payer: Self-pay | Admitting: Licensed Clinical Social Worker

## 2021-01-06 ENCOUNTER — Ambulatory Visit (INDEPENDENT_AMBULATORY_CARE_PROVIDER_SITE_OTHER): Payer: Medicaid Other | Admitting: Licensed Clinical Social Worker

## 2021-01-06 ENCOUNTER — Other Ambulatory Visit: Payer: Self-pay

## 2021-01-06 DIAGNOSIS — F3176 Bipolar disorder, in full remission, most recent episode depressed: Secondary | ICD-10-CM | POA: Diagnosis not present

## 2021-01-06 DIAGNOSIS — F410 Panic disorder [episodic paroxysmal anxiety] without agoraphobia: Secondary | ICD-10-CM

## 2021-01-06 NOTE — Progress Notes (Signed)
Virtual Visit via Video Note  I connected with Margaret [name] on 01/06/21 at 11:00 AM EST by a video enabled telemedicine application and verified that I am speaking with the correct person using two identifiers.  Participating Parties Patient Provider  Location: Patient: Home Provider: Home Office   I discussed the limitations of evaluation and management by telemedicine and the availability of in person appointments. The patient expressed understanding and agreed to proceed.  THERAPY PROGRESS NOTE  Session Time: 30 Minutes  Participation Level: Active  Behavioral Response: Well GroomedAlertEuthymic  Type of Therapy: Individual Therapy  Treatment Goals addressed: Anxiety and Coping  Interventions: CBT  Summary: Margaret [name] is a 65 y.o. female who presents with minimal depression and anxiety sxs. Pt reported "I am doing really good" since last session. Pt reported significant reduction in anxiety sxs and believes this is due to her medication. Pt reported no longer experiencing stomach aches or feeling anxious. Pt reported feeling sense of "relief and able to have more than one appointment"/task per day w/out sense of dread. Pt also reported no crying spells. Pt reported she is reading her devotions and talks often with close friends. Pt reported she is learning to put her own needs first and challenging tendency to be "too nice and want to help everyone" and provided example of how she was able to set boundaries with a neighboring resident who often comes over to talk for hours without notice. Pt reported no other concerns at this time and agreed to follow up in 2 weeks.  Suicidal/Homicidal: No  Therapist Response: Therapist met with patient for follow up. Therapist and patient explored improvement in mood and significant reduction in anxiety sxs. Therapist and patient discussed how patient is implementing coping skills in daily life. Therapist validated patient's feelings and  attempts to set healthy boundaries/expectations for self.   Plan: Return again in 2 weeks.  Diagnosis: Axis I: Bipolar, Depressed and Panic Attacks - In current remission    Axis II: N/A  Josephine Igo, LCSW, LCAS 01/06/2021

## 2021-01-20 ENCOUNTER — Other Ambulatory Visit: Payer: Self-pay

## 2021-01-20 ENCOUNTER — Encounter: Payer: Self-pay | Admitting: Psychiatry

## 2021-01-20 ENCOUNTER — Telehealth: Payer: Medicaid Other | Admitting: Psychiatry

## 2021-01-20 ENCOUNTER — Telehealth (INDEPENDENT_AMBULATORY_CARE_PROVIDER_SITE_OTHER): Payer: Medicaid Other | Admitting: Psychiatry

## 2021-01-20 DIAGNOSIS — F3176 Bipolar disorder, in full remission, most recent episode depressed: Secondary | ICD-10-CM | POA: Diagnosis not present

## 2021-01-20 DIAGNOSIS — F410 Panic disorder [episodic paroxysmal anxiety] without agoraphobia: Secondary | ICD-10-CM

## 2021-01-20 DIAGNOSIS — Z634 Disappearance and death of family member: Secondary | ICD-10-CM | POA: Diagnosis not present

## 2021-01-20 MED ORDER — ARIPIPRAZOLE 15 MG PO TABS: 7.5000 mg | ORAL_TABLET | Freq: Every day | ORAL | 0 refills | Status: DC

## 2021-01-20 MED ORDER — ESCITALOPRAM OXALATE 10 MG PO TABS: 15.0000 mg | ORAL_TABLET | Freq: Every day | ORAL | 0 refills | Status: DC

## 2021-01-20 NOTE — Progress Notes (Signed)
Virtual Visit via Video Note  I connected with [name] on 01/20/21 at  9:30 AM EDT by a video enabled telemedicine application and verified that I am speaking with the correct person using two identifiers.  Location Provider Location : [facility] Patient Location : Home  Participants: Patient , Provider    I discussed the limitations of evaluation and management by telemedicine and the availability of in person appointments. The patient expressed understanding and agreed to proceed.   I discussed the assessment and treatment plan with the patient. The patient was provided an opportunity to ask questions and all were answered. The patient agreed with the plan and demonstrated an understanding of the instructions.   The patient was advised to call back or seek an in-person evaluation if the symptoms worsen or if the condition fails to improve as anticipated.   [facility] MD OP Progress Note  01/20/2021 10:04 AM Mariateresa Batra  MRN:  [id number]  Chief Complaint:  Chief Complaint    Follow-up; Anxiety     HPI: Margaret [name] is a 65 year old Caucasian female on disability, divorced, lives in [place], has a history of bipolar disorder, hyperlipidemia, bereavement, panic attacks, chronic pain was evaluated by telemedicine today.  Patient today reports since being on the Lexapro she has noticed improvement in her anxiety symptoms.  She has not had any panic attacks in the past few weeks.  She is tolerating the Lexapro well.  Patient reports sleep is overall okay.  She does wake up to urinate several times.  She however is able to fall back asleep.  She is on medications which increases her urinary frequency at night.  Patient reports she had a fall in February and sprained her right foot.  She however reports she is currently doing well.  She did not fracture her foot which is a relief.  Patient denies any mood swings.  She denies prior suicidality, homicidality or perceptual  disturbances.  Patient denies any other concerns today.  Visit Diagnosis:    ICD-10-CM   1. Bipolar disorder, in full remission, most recent episode depressed (HCC)  F31.76 ARIPiprazole (ABILIFY) 15 MG tablet  2. Panic attacks  F41.0 escitalopram (LEXAPRO) 10 MG tablet  3. Bereavement  Z63.4     Past Psychiatric History: I have reviewed past psychiatric history from my progress note on 08/21/2019.  Past trials of Depakote, lithium, risperidone, Xanax  Past Medical History:  Past Medical History:  Diagnosis Date  . Bipolar 1 disorder ([place])   . Diabetes mellitus without complication (HCC)    Type 2  . Hypertension     Past Surgical History:  Procedure Laterality Date  . ABDOMINAL HYSTERECTOMY    . CHOLECYSTECTOMY    . COLONOSCOPY WITH PROPOFOL N/A 06/23/2019   Procedure: COLONOSCOPY WITH PROPOFOL;  Surgeon: Jonathon Bellows, MD;  Location: [facility] ENDOSCOPY;  Service: Gastroenterology;  Laterality: N/A;  . ESOPHAGOGASTRODUODENOSCOPY (EGD) WITH PROPOFOL N/A 06/23/2019   Procedure: ESOPHAGOGASTRODUODENOSCOPY (EGD) WITH PROPOFOL;  Surgeon: Jonathon Bellows, MD;  Location[facility] ENDOSCOPY;  Service: Gastroenterology;  Laterality: N/A;  . TONSILLECTOMY      Family Psychiatric History: I have reviewed family psychiatric history from my progress note on 08/21/2019  Family History:  Family History  Problem Relation Age of Onset  . Mental illness Father   . Bipolar disorder Brother   . Bipolar disorder Paternal Uncle   . Bipolar disorder Paternal Uncle     Social History: Reviewed social history from my progress note on 08/21/2019 Social History  Socioeconomic History  . Marital status: Divorced    Spouse name: Not on file  . Number of children: 2  . Years of education: Not on file  . Highest education level: Not on file  Occupational History  . Not on file  Tobacco Use  . Smoking status: Former Smoker    Types: Cigarettes    Quit date: 03/03/2011    Years since quitting: 9.8  .  Smokeless tobacco: Never Used  Vaping Use  . Vaping Use: Never used  Substance and Sexual Activity  . Alcohol use: Not Currently  . Drug use: Not Currently  . Sexual activity: Not on file  Other Topics Concern  . Not on file  Social History Narrative  . Not on file   Social Determinants of Health   Financial Resource Strain: Not on file  Food Insecurity: Not on file  Transportation Needs: Not on file  Physical Activity: Not on file  Stress: Not on file  Social Connections: Not on file    Allergies:  Allergies  Allergen Reactions  . Lithium Nausea And Vomiting  . Penicillins Hives    Resulted in hospitalization  . Sulfa Antibiotics Hives    Resulted in hospitalization    Metabolic Disorder Labs: Lab Results  Component Value Date   HGBA1C 7.3 (H) 06/22/2019   MPG 162.81 06/22/2019   No results found for: PROLACTIN No results found for: CHOL, TRIG, HDL, CHOLHDL, VLDL, LDLCALC Lab Results  Component Value Date   TSH 1.737 11/16/2019    Therapeutic Level Labs: No results found for: LITHIUM No results found for: VALPROATE No components found for:  CBMZ  Current Medications: Current Outpatient Medications  Medication Sig Dispense Refill  . tiZANidine (ZANAFLEX) 2 MG tablet Take by mouth.    Marland Kitchen acetaminophen (TYLENOL) 500 MG tablet Take 500 mg by mouth every 6 (six) hours as needed.     Marland Kitchen alendronate (FOSAMAX) 35 MG tablet Take 35 mg by mouth every 7 (seven) days. Take with a full glass of water on an empty stomach.    Marland Kitchen amLODipine (NORVASC) 10 MG tablet Take 10 mg by mouth daily. (Patient not taking: Reported on 12/23/2020)    . amLODipine (NORVASC) 5 MG tablet Take by mouth.    . ARIPiprazole (ABILIFY) 15 MG tablet Take 0.5 tablets (7.5 mg total) by mouth daily. 45 tablet 0  . B Complex-C (B-COMPLEX WITH VITAMIN C) tablet Take 1 tablet by mouth daily. 30 tablet 2  . bisacodyl (DULCOLAX) 5 MG EC tablet Take 5 mg by mouth daily as needed for moderate constipation.     . bisoprolol-hydrochlorothiazide (ZIAC) 5-6.25 MG tablet Take 1 tablet by mouth daily.    . cetirizine (ZYRTEC) 10 MG tablet Take 10 mg by mouth daily.    . clindamycin (CLEOCIN) 150 MG capsule TAKE 4 CAPSULES 1 HOURABEFORE DENTAL APPOINTMENT. (Patient not taking: Reported on 12/23/2020)    . cloNIDine (CATAPRES) 0.1 MG tablet clonidine HCl 0.1 mg tablet  TAKE 1 TABLET BY MOUTH 2 TIMES A DAY    . cyclobenzaprine (FLEXERIL) 10 MG tablet TAKE 1 TABLET BY MOUTHATHREE TIMES A DAY AS NEEDED.    . Dulaglutide (TRULICITY) 3.15 QM/0.8QP SOPN Inject 0.75 mg into the skin once a week. Thursday    . Ertugliflozin L-PyroglutamicAc (STEGLATRO) 15 MG TABS Take by mouth.  (Patient not taking: Reported on 12/23/2020)    . escitalopram (LEXAPRO) 10 MG tablet Take 1.5 tablets (15 mg total) by mouth daily. 135 tablet 0  .  ferrous sulfate 325 (65 FE) MG tablet Take 1 tablet (325 mg total) by mouth 2 (two) times daily with a meal. 120 tablet 0  . furosemide (LASIX) 20 MG tablet Take 10 mg by mouth daily as needed.    . gabapentin (NEURONTIN) 600 MG tablet TAKE (1) TABLET BY MOUTH (3) TIMES DAILY. 270 tablet 0  . glimepiride (AMARYL) 2 MG tablet Take 2 mg by mouth daily with breakfast.     . hydrochlorothiazide (HYDRODIURIL) 25 MG tablet Take 25 mg by mouth daily.  (Patient not taking: Reported on 12/23/2020)    . HYDROcodone-acetaminophen (NORCO) 10-325 MG tablet Take 1 tablet by mouth 3 (three) times daily. For chronic pain syndrome. To last for 30 days from fill date. 90 tablet 0  . [START ON 02/06/2021] HYDROcodone-acetaminophen (NORCO) 10-325 MG tablet Take 1 tablet by mouth 3 (three) times daily. For chronic pain syndrome. To last for 30 days from fill date. 90 tablet 0  . hydrOXYzine (VISTARIL) 25 MG capsule TAKE 1 OR 2 CAPSULES BY MOUTH ONCE DAILY AS NEEDED. TAKE ONLY FOR SEVERE PANIC ATTACKS. 60 capsule 0  . Insulin Glargine (LANTUS SOLOSTAR) 100 UNIT/ML Solostar Pen Inject 60 Units into the skin at bedtime.     .  insulin glargine (LANTUS) 100 UNIT/ML Solostar Pen Inject into the skin.    Marland Kitchen lamoTRIgine (LAMICTAL) 200 MG tablet Take 1 tablet (200 mg total) by mouth daily. 90 tablet 0  . LANTUS SOLOSTAR 100 UNIT/ML Solostar Pen SMARTSIG:60 Unit(s) SUB-Q Every Night    . losartan (COZAAR) 25 MG tablet Take 25 mg by mouth daily. Takes differently (Patient not taking: Reported on 12/23/2020)    . losartan (COZAAR) 50 MG tablet Take 50 mg by mouth daily. (Patient not taking: Reported on 12/23/2020)    . meclizine (ANTIVERT) 25 MG tablet Take 25 mg by mouth 2 (two) times daily as needed.    . meloxicam (MOBIC) 7.5 MG tablet TAKE 1 TABLET BY MOUTH EVERY DAY    . metFORMIN (GLUCOPHAGE) 500 MG tablet Take 500 mg by mouth 2 (two) times daily with a meal.    . pantoprazole (PROTONIX) 40 MG tablet Take 40 mg by mouth daily. (Patient not taking: Reported on 12/23/2020)    . predniSONE (STERAPRED UNI-PAK 21 TAB) 5 MG (21) TBPK tablet Take by mouth as directed.    Marland Kitchen spironolactone (ALDACTONE) 25 MG tablet Take 25 mg by mouth daily.     No current facility-administered medications for this visit.     Musculoskeletal: Strength & Muscle Tone: UTA Gait & Station: UTA Patient leans: N/A  Psychiatric Specialty Exam: Review of Systems  Genitourinary: Positive for frequency (chronic).  Musculoskeletal:       Rt.foot muscle spasms - after a fall - improving  Psychiatric/Behavioral: The patient is nervous/anxious.   All other systems reviewed and are negative.   There were no vitals taken for this visit.There is no height or weight on file to calculate BMI.  General Appearance: Casual  Eye Contact:  Fair  Speech:  Clear and Coherent  Volume:  Normal  Mood:  Anxious improving  Affect:  Appropriate  Thought Process:  Goal Directed and Descriptions of Associations: Intact  Orientation:  Full (Time, Place, and Person)  Thought Content: Logical   Suicidal Thoughts:  No  Homicidal Thoughts:  No  Memory:  Immediate;    Fair Recent;   Fair Remote;   Fair  Judgement:  Fair  Insight:  Fair  Psychomotor Activity:  Normal  Concentration:  Concentration: Fair and Attention Span: Fair  Recall:  [employer] of Knowledge: Fair  Language: Fair  Akathisia:  No  Handed:  Right  AIMS (if indicated): UTA  Assets:  Communication Skills Desire for Improvement Housing Social Support  ADL's:  Intact  Cognition: WNL  Sleep:  Fair   Screenings: PHQ2-9   Flowsheet Row Video Visit from 12/23/2020 in [facility] Counselor from 12/09/2020 in [facility] Procedure visit from 09/18/2020 in [facility] Procedure visit from 08/28/2020 in [facility]  PHQ-2 Total Score 0 5 0 0  PHQ-9 Total Score -- 15 -- --    Flowsheet Row Video Visit from 12/23/2020 in [facility] from 12/09/2020 in [facility] No Risk No Risk       Assessment and Plan: Margaret [name] is a 65 year old Caucasian female on disability, divorced, currently lives in [place], has a history of bipolar disorder, chronic pain, insomnia, diabetes, hypertension, hyperlipidemia was evaluated by telemedicine today.  Patient is biologically predisposed given her family history, history of trauma.  Patient with psychosocial stressors of the current pandemic, daughter's relocation.  Patient however is currently making progress.  Plan as noted below.  Plan Bipolar disorder in remission Lamotrigine 200 mg p.o. daily. Abilify 7.5 mg p.o. daily Gabapentin 600 mg p.o. 3 times daily.  Panic attacks-improving Lexapro 15 mg p.o. daily with supper Hydroxyzine 25 to 50 mg as needed for severe panic attacks.  She reports she has not been using it since she has not had panic attacks recently.  For bereavement-improving Continue CBT  Follow-up in  clinic in 4 to 5 weeks or sooner in person.  This note was generated in part or whole with voice recognition software. Voice recognition is usually quite accurate but there are transcription errors that can and very often do occur. I apologize for any typographical errors that were not detected and corrected.     Ursula Alert, MD 01/21/2021, 8:27 AM

## 2021-01-21 ENCOUNTER — Encounter: Payer: Self-pay | Admitting: Licensed Clinical Social Worker

## 2021-01-21 ENCOUNTER — Ambulatory Visit (INDEPENDENT_AMBULATORY_CARE_PROVIDER_SITE_OTHER): Payer: Medicaid Other | Admitting: Licensed Clinical Social Worker

## 2021-01-21 DIAGNOSIS — F3176 Bipolar disorder, in full remission, most recent episode depressed: Secondary | ICD-10-CM

## 2021-01-21 DIAGNOSIS — F410 Panic disorder [episodic paroxysmal anxiety] without agoraphobia: Secondary | ICD-10-CM | POA: Diagnosis not present

## 2021-01-21 NOTE — Progress Notes (Signed)
Virtual Visit via Video Note  I connected with Margaret [name] on 01/21/21 at 11:00 AM EDT by a video enabled telemedicine application and verified that I am speaking with the correct person using two identifiers.  Participating Parties Patient Provider  Location: Patient: Home Provider: Home Office   I discussed the limitations of evaluation and management by telemedicine and the availability of in person appointments. The patient expressed understanding and agreed to proceed.  THERAPY PROGRESS NOTE  Session Time: 5 Minutes  Participation Level: Active  Behavioral Response: Well GroomedAlertEuthymic  Type of Therapy: Individual Therapy  Treatment Goals addressed: Anxiety and Coping  Interventions: Supportive  Summary: Margaret [name] is a 65 y.o. female who presents with minimal depression and anxiety sxs. Pt reported she continues to experience no anxiety or depression symptoms since changing medication. Pt reported she talked to psychiatrist about taking a break from therapy "as I have nothing to talk about" now that sxs are in remission. Pt reported the neighbor she was concerned about coming over unannounced has stopped and apologized. Pt reported that she continues to be a source of support for others, however no longer feels overwhelmed by this. Pt reported "I feel human" again. Pt agreed to follow up if sxs return or worsen. Pt confirmed understanding that this therapist will be leaving the practice in 5 weeks. Pt reported no other concerns or questions at this time.   Suicidal/Homicidal: No  Therapist Response: Therapist met with patient for follow up. Therapist and patient remission in sxs and maintained improvement in overall mood with medication management. Therapist informed patient regarding initial phase of terminating services with this therapist due to leaving the practice in the next 5 weeks. Pt denied any concerns around this and had no questions at this time.     Plan:  Return again as needed  Diagnosis: Axis I: Bipolar, Depressed and Panic Attacks - In Remission    Axis II: N/A  Josephine Igo, LCSW, LCAS 01/21/2021

## 2021-02-20 ENCOUNTER — Encounter (HOSPITAL_COMMUNITY): Payer: Self-pay | Admitting: Physical Therapy

## 2021-02-20 NOTE — Therapy (Signed)
[facility] [place], [place], [address] Phone: [phone]   Fax:  [phone]  Patient Details  Name: Margaret [name] MRN: [id number] Date of Birth: Oct 28, 1956 Referring Provider:  No ref. provider found  Encounter Date: 02/20/2021   PHYSICAL THERAPY DISCHARGE SUMMARY  Visits from Start of Care: 4  Current functional level related to goals / functional outcomes: Unknown as patient has not returned.    Remaining deficits: Unknown as patient has not returned.   Education / Equipment: HEP  Plan: Patient agrees to discharge.  Patient goals were not met. Patient is being discharged due to not returning since the last visit.  ?????     11:13 AM, 02/20/21 Mearl Latin PT, DPT Physical Therapist at [facility] [place], [place], [address] Phone: [phone]   Fax:  [phone]

## 2021-02-24 ENCOUNTER — Other Ambulatory Visit: Payer: Self-pay | Admitting: Psychiatry

## 2021-02-24 DIAGNOSIS — F3176 Bipolar disorder, in full remission, most recent episode depressed: Secondary | ICD-10-CM

## 2021-02-25 ENCOUNTER — Other Ambulatory Visit: Payer: Self-pay

## 2021-02-25 ENCOUNTER — Encounter: Payer: Self-pay | Admitting: Student in an Organized Health Care Education/Training Program

## 2021-02-25 ENCOUNTER — Ambulatory Visit
Payer: Medicaid Other | Attending: Student in an Organized Health Care Education/Training Program | Admitting: Student in an Organized Health Care Education/Training Program

## 2021-02-25 VITALS — BP 127/52 | HR 60 | Temp 91.4°F | Resp 16 | Ht 60.0 in | Wt 194.0 lb

## 2021-02-25 DIAGNOSIS — G894 Chronic pain syndrome: Secondary | ICD-10-CM | POA: Insufficient documentation

## 2021-02-25 DIAGNOSIS — M542 Cervicalgia: Secondary | ICD-10-CM | POA: Insufficient documentation

## 2021-02-25 DIAGNOSIS — M5412 Radiculopathy, cervical region: Secondary | ICD-10-CM | POA: Diagnosis not present

## 2021-02-25 DIAGNOSIS — M47812 Spondylosis without myelopathy or radiculopathy, cervical region: Secondary | ICD-10-CM | POA: Insufficient documentation

## 2021-02-25 DIAGNOSIS — M170 Bilateral primary osteoarthritis of knee: Secondary | ICD-10-CM | POA: Diagnosis not present

## 2021-02-25 DIAGNOSIS — M25511 Pain in right shoulder: Secondary | ICD-10-CM | POA: Insufficient documentation

## 2021-02-25 DIAGNOSIS — Z79891 Long term (current) use of opiate analgesic: Secondary | ICD-10-CM | POA: Insufficient documentation

## 2021-02-25 DIAGNOSIS — M47816 Spondylosis without myelopathy or radiculopathy, lumbar region: Secondary | ICD-10-CM | POA: Diagnosis present

## 2021-02-25 MED ORDER — HYDROCODONE-ACETAMINOPHEN 10-325 MG PO TABS: 1.0000 | ORAL_TABLET | Freq: Three times a day (TID) | ORAL | 0 refills | Status: DC

## 2021-02-25 MED ORDER — HYDROCODONE-ACETAMINOPHEN 10-325 MG PO TABS
1.0000 | ORAL_TABLET | Freq: Three times a day (TID) | ORAL | 0 refills | Status: DC
Start: 2021-03-10 — End: 2021-03-04

## 2021-02-25 NOTE — Progress Notes (Signed)
PROVIDER NOTE: Information contained herein reflects review and annotations entered in association with encounter. Interpretation of such information and data should be left to medically-trained personnel. Information provided to patient can be located elsewhere in the medical record under "Patient Instructions". Document created using STT-dictation technology, any transcriptional errors that may result from process are unintentional.    Patient: Margaret [name]  Service Category: E/M  Provider: Gillis Santa, MD  DOB: 05-06-1956  DOS: 02/25/2021  Specialty: Interventional Pain Management  MRN: [id number]  Setting: Ambulatory outpatient  PCP: Rusty Aus, MD  Type: Established Patient    Referring Provider: Rusty Aus, MD  Location: Office  Delivery: Face-to-face     HPI  [name], a 65 y.o. year old female, is here today because of her Bilateral primary osteoarthritis of knee [M17.0]. [name] primary complain today is Knee Pain (bilat) Last encounter: My last encounter with her was on 11/28/2020. Pertinent problems: [name] has Chronic pain syndrome; Encounter for long-term opiate analgesic use; Bilateral primary osteoarthritis of knee; Sacroiliac joint pain; History of left hip replacement; Recurrent major depressive episodes, moderate ([place]); Cervical radicular pain; Pain management contract signed; Lumbar facet arthropathy; Pain in joint of right shoulder; and Cervical facet joint syndrome on their pertinent problem list. Pain Assessment: Severity of Chronic pain is reported as a 5 /10. Location: Knee Right,Left/to left hip and through legs to feet. Onset: More than a month ago. Quality: Burning. Timing: Constant. Modifying factor(s): meds help "take edge off". Vitals:  height is 5' (1.524 m) and weight is 194 lb (88 kg). Her temporal temperature is 91.4 F (33 C) (abnormal). Her blood pressure is 127/52 (abnormal) and her pulse is 60. Her respiration is 16 and oxygen saturation is  97%.   Reason for encounter: medication management. and worsening bilateral knee pain (s/p fall; s/p IA  Patient presents today for medication management.  Since her last clinic visit, patient did sustain a fall in early March. She states she was at an urgent care and as she was lifting her foot to get on the curb, she fell backwards onto her buttocks. She did not hit her head. She did not los consciousness. She reports that her lower back started hurting and her right foot after the fall.  She was seen by her primary care provider for this.  X-rays of her right foot and lumbar spine were unremarkable.  Right foot sprain.  She endorses bilateral knee pain.  She is status post bilateral intra-articular knee Hyalgan injection #1 on 08/28/2020 that provided her with 100% pain relief for approximately 4 months.  She has noticed worsening knee pain that increased after her fall in early March.  She is also seeing psychiatry.  Continues Lexapro for anxiety.  She is also on Lamictal 20 mg daily as well as Abilify.  She has reduced her gabapentin to 600 mg in the evening.  This is managed by psychiatry.  Stopped PT for cervical pain, did approximately 4 sessions and felt that she got all the benefit she could after 4 sessions.   Pharmacotherapy Assessment   02/08/2021  11/28/2020   3  Hydrocodone-Acetamin 10-325 Mg  90.00  30  Bi Lat  [id number]  Car ([id number])  0/0  30.00 MME  Medicaid  [place]      Analgesic: Norco 10 mg TID prn   Monitoring: [place] PMP: PDMP reviewed during this encounter.       Pharmacotherapy: No side-effects or adverse reactions reported. Compliance: No  problems identified. Effectiveness: Clinically acceptable.  [name], RN  02/25/2021  1:15 PM  Sign when Signing Visit Nursing Pain Medication Assessment:  Safety precautions to be maintained throughout the outpatient stay will include: orient to surroundings, keep bed in low position, maintain call bell within reach at all times,  provide assistance with transfer out of bed and ambulation.  Medication Inspection Compliance: Pill count conducted under aseptic conditions, in front of the patient. Neither the pills nor the bottle was removed from the patient's sight at any time. Once count was completed pills were immediately returned to the patient in their original bottle.  Medication: Hydrocodone/APAP Pill/Patch Count: 42 of 90 pills remain Pill/Patch Appearance: Markings consistent with prescribed medication Bottle Appearance: Standard pharmacy container. Clearly labeled. Filled Date: 04 / 09 / 2022 Last Medication intake:  Today    UDS:  Summary  Date Value Ref Range Status  06/20/2020 Note  Final    Comment:    ==================================================================== Compliance Drug Analysis, Ur ==================================================================== Test                             Result       Flag       Units  Drug Present and Declared for Prescription Verification   Hydrocodone                    2260         EXPECTED   ng/mg creat   Norhydrocodone                 2128         EXPECTED   ng/mg creat    Sources of hydrocodone include scheduled prescription medications.    Norhydrocodone is an expected metabolite of hydrocodone.    Gabapentin                     PRESENT      EXPECTED   Lamotrigine                    PRESENT      EXPECTED   Citalopram                     PRESENT      EXPECTED   Desmethylcitalopram            PRESENT      EXPECTED    Desmethylcitalopram is an expected metabolite of citalopram or the    enantiomeric form, escitalopram.    Aripiprazole                   PRESENT      EXPECTED   Acetaminophen                  PRESENT      EXPECTED  Drug Present not Declared for Prescription Verification   Naproxen                       PRESENT      UNEXPECTED  Drug Absent but Declared for Prescription Verification   Mirtazapine                    Not Detected  UNEXPECTED   Propranolol                    Not Detected UNEXPECTED ==================================================================== Test  Result    Flag   Units      Ref Range   Creatinine              25               mg/dL      >=20 ==================================================================== Declared Medications:  The flagging and interpretation on this report are based on the  following declared medications.  Unexpected results may arise from  inaccuracies in the declared medications.   **Note: The testing scope of this panel includes these medications:   Aripiprazole (Abilify)  Citalopram (Celexa)  Gabapentin  Hydrocodone (Norco)  Lamotrigine (Lamictal)  Mirtazapine (Remeron)  Propranolol   **Note: The testing scope of this panel does not include small to  moderate amounts of these reported medications:   Acetaminophen (Tylenol)  Acetaminophen (Norco)   **Note: The testing scope of this panel does not include the  following reported medications:   Alendronate (Fosamax)  Azithromycin  Cetirizine (Zyrtec)  Clindamycin (Cleocin)  Dulaglutide (Trulicity)  Ertugliflozin (Steglatro)  Fluconazole (Diflucan)  Furosemide (Lasix)  Glimepiride  Hydrochlorothiazide  Insulin (Lantus)  Losartan (Cozaar)  Meclizine (Antivert)  Meloxicam (Mobic)  Metformin  Pantoprazole (Protonix)  Spironolactone  Vitamin B  Vitamin C ==================================================================== For clinical consultation, please call [phone]. ====================================================================      ROS  Constitutional: Denies any fever or chills Gastrointestinal: No reported hemesis, hematochezia, vomiting, or acute GI distress Musculoskeletal: Bilateral knee pain Neurological: No reported episodes of acute onset apraxia, aphasia, dysarthria, agnosia, amnesia, paralysis, loss of coordination, or loss of  consciousness  Medication Review  ARIPiprazole, B-complex with vitamin C, Dulaglutide, HYDROcodone-acetaminophen, acetaminophen, alendronate, amLODipine, bisacodyl, bisoprolol-hydrochlorothiazide, cetirizine, cyclobenzaprine, escitalopram, ferrous sulfate, furosemide, gabapentin, glimepiride, hydrOXYzine, insulin glargine, lamoTRIgine, losartan, meclizine, meloxicam, metFORMIN, pantoprazole, spironolactone, and tiZANidine  History Review  Allergy: [name] is allergic to lithium, penicillins, and sulfa antibiotics. Drug: [name]  reports previous drug use. Alcohol:  reports previous alcohol use. Tobacco:  reports that she quit smoking about 9 years ago. Her smoking use included cigarettes. She has never used smokeless tobacco. Social: [name]  reports that she quit smoking about 9 years ago. Her smoking use included cigarettes. She has never used smokeless tobacco. She reports previous alcohol use. She reports previous drug use. Medical:  has a past medical history of Bipolar 1 disorder ([place]), Diabetes mellitus without complication ([place]), and Hypertension. Surgical: Ms. Seabury  has a past surgical history that includes Abdominal hysterectomy; Tonsillectomy; Cholecystectomy; Esophagogastroduodenoscopy (egd) with propofol (N/A, 06/23/2019); and Colonoscopy with propofol (N/A, 06/23/2019). Family: family history includes Bipolar disorder in her brother, paternal uncle, and paternal uncle; Mental illness in her father.  Laboratory Chemistry Profile   Renal Lab Results  Component Value Date   BUN 11 12/06/2019   CREATININE 0.59 12/06/2019   BCR 19 12/06/2019   GFRAA 114 12/06/2019   GFRNONAA 99 12/06/2019     Hepatic Lab Results  Component Value Date   AST 48 (H) 12/06/2019   ALT 47 (H) 12/06/2019   ALBUMIN 3.7 (L) 12/06/2019   ALKPHOS 122 (H) 12/06/2019     Electrolytes Lab Results  Component Value Date   NA 137 12/06/2019   K 3.9 12/06/2019   CL 101 12/06/2019    CALCIUM 9.7 12/06/2019     Bone No results found for: VD25OH, VD125OH2TOT, [id number], [id number], 25OHVITD1, 25OHVITD2, 25OHVITD3, TESTOFREE, TESTOSTERONE   Inflammation (CRP: Acute Phase) (ESR: Chronic Phase) No results found for: CRP, ESRSEDRATE, LATICACIDVEN  Note: Above Lab results reviewed.  Recent Imaging Review  US Carotid Bilateral CLINICAL DATA:  Carotid atherosclerosis, hypertension, diabetes  EXAM: BILATERAL CAROTID DUPLEX ULTRASOUND  TECHNIQUE: Pearline Cables scale imaging, color Doppler and duplex ultrasound were performed of bilateral carotid and vertebral arteries in the neck.  COMPARISON:  None.  FINDINGS: Criteria: Quantification of carotid stenosis is based on velocity parameters that correlate the residual internal carotid diameter with NASCET-based stenosis levels, using the diameter of the distal internal carotid lumen as the denominator for stenosis measurement.  The following velocity measurements were obtained:  RIGHT  ICA: 130/38 cm/sec  CCA: 607/37 cm/sec  SYSTOLIC ICA/CCA RATIO:  1.1  ECA: 226 cm/sec  LEFT  ICA: 175/41 cm/sec  CCA: 106/26 cm/sec  SYSTOLIC ICA/CCA RATIO:  1.5  ECA: 153 cm/sec  RIGHT CAROTID ARTERY: Diffuse mild intimal thickening and minor atherosclerotic change. No hemodynamically significant right ICA stenosis, velocity elevation, turbulent flow. Degree of narrowing less than 50% by ultrasound criteria.  RIGHT VERTEBRAL ARTERY:  Normal antegrade flow  LEFT CAROTID ARTERY: Similar intimal thickening and minor atherosclerotic change. Slight distal ICA velocity elevation as above but no significant grayscale abnormality, luminal narrowing, turbulent flow, or spectral broadening. Degree of stenosis still estimated less than 50%.  LEFT VERTEBRAL ARTERY:  Normal antegrade flow  IMPRESSION: Bilateral carotid atherosclerosis. No hemodynamically significant ICA stenosis. Degree of narrowing less than 50% bilaterally  by ultrasound criteria.  Patent antegrade vertebral flow bilaterally  Electronically Signed   By: Jerilynn Mages.  Shick M.D.   On: 12/25/2020 14:44 Note: Reviewed        Physical Exam  General appearance: Well nourished, well developed, and well hydrated. In no apparent acute distress Mental status: Alert, oriented x 3 (person, place, & time)       Respiratory: No evidence of acute respiratory distress Eyes: PERLA Vitals: BP (!) 127/52   Pulse 60   Temp (!) 91.4 F (33 C) (Temporal)   Resp 16   Ht 5' (1.524 m)   Wt 194 lb (88 kg)   SpO2 97%   BMI 37.89 kg/m  BMI: Estimated body mass index is 37.89 kg/m as calculated from the following:   Height as of this encounter: 5' (1.524 m).   Weight as of this encounter: 194 lb (88 kg). Ideal: Ideal body weight: 45.5 kg (100 lb 4.9 oz) Adjusted ideal body weight: 62.5 kg (137 lb 12.6 oz)   Lumbar Exam  Skin & Axial Inspection:No masses, redness, or swelling Alignment:Symmetrical Functional RSW:NIOE restricted ROMaffecting both sides Stability:No instability detected Muscle Tone/Strength:Functionally intact. No obvious neuro-muscular anomalies detected. Sensory (Neurological):Musculoskeletal pain patternand dermatomal pain pattern at left L4-L5  Provocative Tests: Hyperextension/rotation test:(+)bilaterally for facet joint pain. Lumbar quadrant test (Kemp's test):(+)bilaterally for facet joint pain. Lateral bending test:(+)due to pain., Positive straight leg raise test Patrick's Maneuver:deferred today FABER* test:deferred today S-I anterior distraction/compression test:deferred today S-I lateral compression test:deferred today S-I Thigh-thrust test:deferred today S-I Gaenslen's test:deferred today *(Flexion, ABduction and External Rotation)  Gait & Posture Assessment  Ambulation:Patient came in today in a wheel chair Gait:Limited. Using  assistive device to ambulate Posture:Antalgic  Lower Extremity Exam    Side:Right lower extremity  Side:Left lower extremity  Stability:No instability observed  Stability:No instability observed  Skin & Extremity Inspection:Skin color, temperature, and hair growth are WNL. No peripheral edema or cyanosis. No masses, redness, swelling, asymmetry, or associated skin lesions. No contractures.  Skin & Extremity Inspection:Skin color, temperature, and hair growth are WNL. No peripheral edema or cyanosis. No  masses, redness, swelling, asymmetry, or associated skin lesions. No contractures.  Functional VQM:GQQP restricted ROMfor knee joint   Functional YPP:JKDT restricted ROMfor hip and knee joints, positive straight leg raise test   Muscle Tone/Strength:Functionally intact. No obvious neuro-muscular anomalies detected.  Muscle Tone/Strength:Functionally intact. No obvious neuro-muscular anomalies detected.  Sensory (Neurological):Arthropathic arthralgia knee right foot pain  Sensory (Neurological):Arthropathic arthralgia for the kneeand neurogenic  DTR: Patellar:deferred today Achilles:deferred today Plantar:deferred today  DTR: Patellar:deferred today Achilles:deferred today Plantar:deferred today  Palpation:No palpable anomalies  Palpation:No palpable anomalies      Assessment   Status Diagnosis  Having a Flare-up Controlled Controlled 1. Bilateral primary osteoarthritis of knee   2. Cervical facet joint syndrome   3. Cervical radicular pain   4. Encounter for long-term opiate analgesic use   5. Neck pain on right side   6. Pain in joint of right shoulder   7. Lumbar facet arthropathy   8. Chronic pain syndrome      Updated Problems: Problem  Cervical Facet Joint Syndrome  Pain in Joint of Right Shoulder  Cervical Radicular Pain  Pain Management Contract Signed  Lumbar Facet Arthropathy  Chronic  Pain Syndrome  Encounter for Long-Term Opiate Analgesic Use  Bilateral Primary Osteoarthritis of Knee  Sacroiliac Joint Pain  History of Left Hip Replacement  Recurrent Major Depressive Episodes, Moderate ([place])    Plan of Care   Margaret [name] has a current medication list which includes the following long-term medication(s): aripiprazole, bisoprolol-hydrochlorothiazide, cetirizine, escitalopram, ferrous sulfate, furosemide, gabapentin, glimepiride, lamotrigine, losartan, losartan, metformin, pantoprazole, and spironolactone.  Pharmacotherapy (Medications Ordered): Meds ordered this encounter  Medications  . HYDROcodone-acetaminophen (NORCO) 10-325 MG tablet    Sig: Take 1 tablet by mouth 3 (three) times daily. For chronic pain syndrome. To last for 30 days from fill date.    Dispense:  90 tablet    Refill:  0    Ok to fill 1 day early if pharmacy closed on fill date  . HYDROcodone-acetaminophen (NORCO) 10-325 MG tablet    Sig: Take 1 tablet by mouth 3 (three) times daily. For chronic pain syndrome. To last for 30 days from fill date.    Dispense:  90 tablet    Refill:  0    Ok to fill 1 day early if pharmacy closed on fill date  . HYDROcodone-acetaminophen (NORCO) 10-325 MG tablet    Sig: Take 1 tablet by mouth 3 (three) times daily. For chronic pain syndrome. To last for 30 days from fill date.    Dispense:  90 tablet    Refill:  0    Ok to fill 1 day early if pharmacy closed on fill date   Orders:  Orders Placed This Encounter  Procedures  . KNEE INJECTION    Hyalgan knee injection. Please order Hyalgan.    Standing Status:   Future    Standing Expiration Date:   03/27/2021    Scheduling Instructions:     Procedure: Intra-articular Hyalgan Knee injection            Side: Bilateral     Sedation: None     Timeframe: in two (2) weeks    Order Specific Question:   Where will this procedure be performed?    Answer:   [facility]   Follow-up plan:   Return in  about 3 weeks (around 03/18/2021) for B/L knee hyalgan #2.      Interventional management options: Planned, scheduled, and/or pending:    Intra-articular  knee Hyalgan series #1 done 08/28/20 helped significantly (100% pain relief for 4 months), repeat as needed   Considering:   Lumbar epidural steroid injection, lumbar facet medial branch nerve block pending lumbar MRI   PRN Procedures:   Repeat bilateral intra-articular Hyalgan injection        Recent Visits Date Type Provider Dept  11/28/20 Office Visit Gillis Santa, MD [facility]  Showing recent visits within past 90 days and meeting all other requirements Today's Visits Date Type Provider Dept  02/25/21 Office Visit Gillis Santa, MD [facility]  Showing today's visits and meeting all other requirements Future Appointments No visits were found meeting these conditions. Showing future appointments within next 90 days and meeting all other requirements  I discussed the assessment and treatment plan with the patient. The patient was provided an opportunity to ask questions and all were answered. The patient agreed with the plan and demonstrated an understanding of the instructions.  Patient advised to call back or seek an in-person evaluation if the symptoms or condition worsens.  Duration of encounter: 66mnutes.  Note by: BGillis Santa MD Date: 02/25/2021; Time: 1:20 PM

## 2021-02-25 NOTE — Progress Notes (Signed)
Nursing Pain Medication Assessment:  Safety precautions to be maintained throughout the outpatient stay will include: orient to surroundings, keep bed in low position, maintain call bell within reach at all times, provide assistance with transfer out of bed and ambulation.  Medication Inspection Compliance: Pill count conducted under aseptic conditions, in front of the patient. Neither the pills nor the bottle was removed from the patient's sight at any time. Once count was completed pills were immediately returned to the patient in their original bottle.  Medication: Hydrocodone/APAP Pill/Patch Count: 42 of 90 pills remain Pill/Patch Appearance: Markings consistent with prescribed medication Bottle Appearance: Standard pharmacy container. Clearly labeled. Filled Date: 04 / 09 / 2022 Last Medication intake:  Today

## 2021-02-25 NOTE — Patient Instructions (Signed)
Hydrocodone to last until 05/09/2021 has been escribed to your pharmacy.

## 2021-03-04 ENCOUNTER — Ambulatory Visit (INDEPENDENT_AMBULATORY_CARE_PROVIDER_SITE_OTHER): Payer: Medicaid Other | Admitting: Psychiatry

## 2021-03-04 ENCOUNTER — Other Ambulatory Visit: Payer: Self-pay

## 2021-03-04 ENCOUNTER — Encounter: Payer: Self-pay | Admitting: Psychiatry

## 2021-03-04 VITALS — BP 116/68 | HR 61

## 2021-03-04 DIAGNOSIS — R251 Tremor, unspecified: Secondary | ICD-10-CM

## 2021-03-04 DIAGNOSIS — F3176 Bipolar disorder, in full remission, most recent episode depressed: Secondary | ICD-10-CM | POA: Diagnosis not present

## 2021-03-04 DIAGNOSIS — F410 Panic disorder [episodic paroxysmal anxiety] without agoraphobia: Secondary | ICD-10-CM

## 2021-03-04 MED ORDER — BENZTROPINE MESYLATE 0.5 MG PO TABS: 0.5000 mg | ORAL_TABLET | Freq: Every day | ORAL | 1 refills | Status: DC | PRN

## 2021-03-04 MED ORDER — HYDROXYZINE PAMOATE 25 MG PO CAPS: ORAL_CAPSULE | ORAL | 0 refills | Status: DC

## 2021-03-04 MED ORDER — LAMOTRIGINE 200 MG PO TABS: 200.0000 mg | ORAL_TABLET | Freq: Every day | ORAL | 0 refills | Status: DC

## 2021-03-04 NOTE — Progress Notes (Signed)
[facility] MD OP Progress Note  03/04/2021 5:17 PM Margaret [name]  MRN:  [id number]  Chief Complaint:  Chief Complaint    Follow-up; Depression; Anxiety     HPI: Margaret [name] is a 65 year old Caucasian female on disability, divorced, lives in [place], has a history of bipolar disorder, hyperlipidemia, bereavement, panic attacks, chronic pain was evaluated in office today.  Patient today reports she is currently overall doing okay with regards to her mood.  She reports she does have support from her son who lives in [place] as well as her friend.  She also has a friend who comes in to clean up and help her since she cannot stand up on her feet for too long.  She uses a walker to walk.  She cannot walk long distances.  She reports she has burning pain of her right sided foot and she also has left-sided hip pain, status post surgery in the past.  It does radiate down to both legs.  She is currently following up with providers for management of her pain.  She is due for an injection.  Patient denies any suicidality, homicidality or perceptual disturbances.  She reports she has to wake up several times at night since she is on medications that make her urinate at night.  She  reports sleep is interrupted.  Overall she gets around 6 hours of sleep.  Patient reports she has noticed tremors of bilateral hands which is worse in the morning.  She wonders whether it is medication induced or not.  She reports she is getting better with regards to her grief.  She misses her mother a lot since they were very close.  She however reports she has been coping better and has been making use of her support system.  It has been more than a year since her mother passed away.  Patient denies any other concerns today.  Visit Diagnosis:    ICD-10-CM   1. Bipolar disorder, in full remission, most recent episode depressed (HCC)  F31.76 lamoTRIgine (LAMICTAL) 200 MG tablet  2. Panic attacks  F41.0 hydrOXYzine (VISTARIL) 25  MG capsule    lamoTRIgine (LAMICTAL) 200 MG tablet  3. Tremor  R25.1 benztropine (COGENTIN) 0.5 MG tablet    Past Psychiatric History: I have reviewed past psychiatric history from my progress note on 08/21/2019.  Past trials of Depakote, lithium, risperidone, Xanax  Past Medical History:  Past Medical History:  Diagnosis Date  . Bipolar 1 disorder ([place])   . Diabetes mellitus without complication (HCC)    Type 2  . Hypertension     Past Surgical History:  Procedure Laterality Date  . ABDOMINAL HYSTERECTOMY    . CHOLECYSTECTOMY    . COLONOSCOPY WITH PROPOFOL N/A 06/23/2019   Procedure: COLONOSCOPY WITH PROPOFOL;  Surgeon: Jonathon Bellows, MD;  Location: [facility] ENDOSCOPY;  Service: Gastroenterology;  Laterality: N/A;  . ESOPHAGOGASTRODUODENOSCOPY (EGD) WITH PROPOFOL N/A 06/23/2019   Procedure: ESOPHAGOGASTRODUODENOSCOPY (EGD) WITH PROPOFOL;  Surgeon: Jonathon Bellows, MD;  Location: [facility] ENDOSCOPY;  Service: Gastroenterology;  Laterality: N/A;  . TONSILLECTOMY      Family Psychiatric History: I have reviewed family psychiatric history from progress note on 08/21/2019  Family History:  Family History  Problem Relation Age of Onset  . Mental illness Father   . Bipolar disorder Brother   . Bipolar disorder Paternal Uncle   . Bipolar disorder Paternal Uncle     Social History: Reviewed social history from progress note on 08/21/2019 Social History   Socioeconomic History  .  Marital status: Divorced    Spouse name: Not on file  . Number of children: 2  . Years of education: Not on file  . Highest education level: Not on file  Occupational History  . Not on file  Tobacco Use  . Smoking status: Former Smoker    Types: Cigarettes    Quit date: 03/03/2011    Years since quitting: 10.0  . Smokeless tobacco: Never Used  Vaping Use  . Vaping Use: Never used  Substance and Sexual Activity  . Alcohol use: Not Currently  . Drug use: Not Currently  . Sexual activity: Not on file  Other  Topics Concern  . Not on file  Social History Narrative  . Not on file   Social Determinants of Health   Financial Resource Strain: Not on file  Food Insecurity: Not on file  Transportation Needs: Not on file  Physical Activity: Not on file  Stress: Not on file  Social Connections: Not on file    Allergies:  Allergies  Allergen Reactions  . Lithium Nausea And Vomiting  . Penicillins Hives    Resulted in hospitalization  . Sulfa Antibiotics Hives    Resulted in hospitalization    Metabolic Disorder Labs: Lab Results  Component Value Date   HGBA1C 7.3 (H) 06/22/2019   MPG 162.81 06/22/2019   No results found for: PROLACTIN No results found for: CHOL, TRIG, HDL, CHOLHDL, VLDL, LDLCALC Lab Results  Component Value Date   TSH 1.737 11/16/2019    Therapeutic Level Labs: No results found for: LITHIUM No results found for: VALPROATE No components found for:  CBMZ  Current Medications: Current Outpatient Medications  Medication Sig Dispense Refill  . acetaminophen (TYLENOL) 500 MG tablet Take 500 mg by mouth every 6 (six) hours as needed.     Marland Kitchen alendronate (FOSAMAX) 35 MG tablet Take 35 mg by mouth every 7 (seven) days. Take with a full glass of water on an empty stomach.    Marland Kitchen amLODipine (NORVASC) 5 MG tablet Take by mouth.    . ARIPiprazole (ABILIFY) 15 MG tablet Take 0.5 tablets (7.5 mg total) by mouth daily. 45 tablet 0  . B Complex-C (B-COMPLEX WITH VITAMIN C) tablet Take 1 tablet by mouth daily. 30 tablet 2  . benztropine (COGENTIN) 0.5 MG tablet Take 1 tablet (0.5 mg total) by mouth daily as needed for tremors. 15 tablet 1  . bisacodyl (DULCOLAX) 5 MG EC tablet Take 5 mg by mouth daily as needed for moderate constipation.    . bisoprolol-hydrochlorothiazide (ZIAC) 5-6.25 MG tablet Take 1 tablet by mouth daily.    . cetirizine (ZYRTEC) 10 MG tablet Take 10 mg by mouth daily.    . cyclobenzaprine (FLEXERIL) 10 MG tablet TAKE 1 TABLET BY MOUTHATHREE TIMES A DAY AS  NEEDED.    . Dulaglutide (TRULICITY) 0.63 KZ/6.0FU SOPN Inject 0.75 mg into the skin once a week. Thursday    . escitalopram (LEXAPRO) 10 MG tablet Take 1.5 tablets (15 mg total) by mouth daily. 135 tablet 0  . furosemide (LASIX) 20 MG tablet Take 10 mg by mouth daily as needed.    . gabapentin (NEURONTIN) 600 MG tablet TAKE (1) TABLET BY MOUTH (3) TIMES DAILY. 270 tablet 0  . [START ON 05/09/2021] HYDROcodone-acetaminophen (NORCO) 10-325 MG tablet Take 1 tablet by mouth 3 (three) times daily. For chronic pain syndrome. To last for 30 days from fill date. 90 tablet 0  . insulin glargine (LANTUS) 100 UNIT/ML Solostar Pen Inject into  the skin.    Marland Kitchen losartan (COZAAR) 25 MG tablet Take 25 mg by mouth daily. Takes differently    . meclizine (ANTIVERT) 25 MG tablet Take 25 mg by mouth 2 (two) times daily as needed.    . meloxicam (MOBIC) 7.5 MG tablet TAKE 1 TABLET BY MOUTH EVERY DAY    . metFORMIN (GLUCOPHAGE) 500 MG tablet Take 500 mg by mouth 2 (two) times daily with a meal.    . pantoprazole (PROTONIX) 40 MG tablet Take 40 mg by mouth daily.    Marland Kitchen spironolactone (ALDACTONE) 25 MG tablet Take 25 mg by mouth daily.    Marland Kitchen tiZANidine (ZANAFLEX) 2 MG tablet Take by mouth.    . ferrous sulfate 325 (65 FE) MG tablet Take 1 tablet (325 mg total) by mouth 2 (two) times daily with a meal. 120 tablet 0  . glimepiride (AMARYL) 2 MG tablet Take 2 mg by mouth daily with breakfast.     . hydrOXYzine (VISTARIL) 25 MG capsule TAKE 1 OR 2 CAPSULES BY MOUTH ONCE DAILY AS NEEDED. TAKE ONLY FOR SEVERE PANIC ATTACKS. 60 capsule 0  . lamoTRIgine (LAMICTAL) 200 MG tablet Take 1 tablet (200 mg total) by mouth daily. 90 tablet 0   No current facility-administered medications for this visit.     Musculoskeletal: Strength & Muscle Tone: UTA Gait & Station: Wheelchair bound Patient leans: N/A  Psychiatric Specialty Exam: Review of Systems  Musculoskeletal: Positive for arthralgias.       Left sided hip pain, BL leg  pain, Right sided foot pain  Neurological: Positive for tremors.  Psychiatric/Behavioral: Positive for sleep disturbance.    Blood pressure 116/68, pulse 61.There is no height or weight on file to calculate BMI.  General Appearance: Casual  Eye Contact:  Fair  Speech:  Clear and Coherent  Volume:  Normal  Mood:  Euthymic  Affect:  Congruent  Thought Process:  Goal Directed and Descriptions of Associations: Intact  Orientation:  Full (Time, Place, and Person)  Thought Content: Logical   Suicidal Thoughts:  No  Homicidal Thoughts:  No  Memory:  Immediate;   Fair Recent;   Fair Remote;   Fair  Judgement:  Fair  Insight:  Fair  Psychomotor Activity:  Normal  Concentration:  Concentration: Fair and Attention Span: Fair  Recall:  [employer] of Knowledge: Fair  Language: Fair  Akathisia:  No  Handed:  Right  AIMS (if indicated): done  Assets:  Communication Skills Desire for Improvement Housing Social Support  ADL's:  Intact  Cognition: WNL  Sleep:  Restless   Screenings: [place] Office Visit from 03/04/2021 in [facility] Total Score 0    PHQ2-9   [place] Visit from 03/04/2021 in [facility] Video Visit from 12/23/2020 in [facility] Counselor from 12/09/2020 in [facility] Procedure visit from 09/18/2020 in [facility] Procedure visit from 08/28/2020 in [facility]  PHQ-2 Total Score 1 0 5 0 0  PHQ-9 Total Score -- -- 15 -- --    [place] Office Visit from 03/04/2021 in [facility] Video Visit from 12/23/2020 in [facility] Counselor from 12/09/2020 in [facility] No Risk No Risk No Risk       Assessment and Plan: Margaret [name] is a  66 year old Caucasian female on disability,  divorced, currently lives in [place], has a history of bipolar disorder, chronic pain, insomnia, diabetes, hypertension, hyperlipidemia was evaluated in office today.  Patient is biologically predisposed given family history, history of trauma.  Patient with psychosocial stressors at the current pandemic, daughter's relocation.  Patient is currently stable on current medication regimen.  She however does have tremors unknown if this is due to her medications like Abilify.  Plan as noted below.  Plan Bipolar disorder in remission Lamotrigine 200 mg p.o. daily Abilify 7.5 mg p.o. daily Gabapentin 600 mg p.o. 3 times daily  Panic attacks-improving Lexapro 15 mg p.o. daily with supper Hydroxyzine 25-50 mg as needed for severe panic attacks.  Tremors-likely induced by psychotropics-unstable Will start benztropine 0.5 mg as needed for severe tremors. Provided education.  Advised patient to use it only as needed and to limit use.  Patient to continue to follow-up with her pain provider for management of pain.  Follow-up in clinic in 1 month or sooner if needed.  This note was generated in part or whole with voice recognition software. Voice recognition is usually quite accurate but there are transcription errors that can and very often do occur. I apologize for any typographical errors that were not detected and corrected.       Ursula Alert, MD 03/05/2021, 12:47 PM

## 2021-03-04 NOTE — Patient Instructions (Signed)
Benztropine tablets What is this medicine? BENZTROPINE (BENZ troe peen) is for certain movement problems due to Parkinson's disease, certain medicines, or other causes. This medicine may be used for other purposes; ask your health care provider or pharmacist if you have questions. COMMON BRAND NAME(S): Cogentin What should I tell my health care provider before I take this medicine? They need to know if you have any of these conditions:  glaucoma  heart disease or a rapid heartbeat  mental problems  prostate trouble  tardive dyskinesia  an unusual or allergic reaction to benztropine, other medicines, lactose, foods, dyes, or preservatives  pregnant or trying to get pregnant  breast-feeding How should I use this medicine? Take this medicine by mouth with a full glass of water. Follow the directions on the prescription label. Take your medicine at regular intervals. Do not take your medicine more often than directed. Talk to your pediatrician regarding the use of this medicine in children. While this drug may be prescribed for children as young as 3 years for selected conditions, precautions do apply. Overdosage: If you think you have taken too much of this medicine contact a poison control center or emergency room at once. NOTE: This medicine is only for you. Do not share this medicine with others. What if I miss a dose? If you miss a dose, take it as soon as you can. If it is almost time for your next dose, take only that dose. Do not take double or extra doses. What may interact with this medicine?  haloperidol  medicines for movement abnormalities like Parkinson's disease  phenothiazines like chlorpromazine, mesoridazine, prochlorperazine, thioridazine  some antidepressants like amitriptyline, desipramine, doxepin, nortriptyline  stimulant medicines for attention, weight loss, and to stay awake  tegaserod This list may not describe all possible interactions. Give your  health care provider a list of all the medicines, herbs, non-prescription drugs, or dietary supplements you use. Also tell them if you smoke, drink alcohol, or use illegal drugs. Some items may interact with your medicine. What should I watch for while using this medicine? Visit your doctor or health care professional for regular checks on your progress. You may get drowsy or dizzy. Do not drive, use machinery, or do anything that needs mental alertness until you know how this medicine affects you. Do not stand or sit up quickly, especially if you are an older patient. This reduces the risk of dizzy or fainting spells. Alcohol may interfere with the effect of this medicine. Avoid alcoholic drinks. Your mouth may get dry. Chewing sugarless gum or sucking hard candy, and drinking plenty of water may help. Contact your doctor if the problem does not go away or is severe. This medicine may cause dry eyes and blurred vision. If you wear contact lenses you may feel some discomfort. Lubricating drops may help. See your eye doctor if the problem does not go away or is severe. You may sweat less than usual while you are taking this medicine. As a result your body temperature could rise to a dangerous level. Be careful not to get overheated during exercise or in hot weather. You could get heat stroke. Avoid taking hot baths and using hot tubs and saunas. What side effects may I notice from receiving this medicine? Side effects that you should report to your doctor or health care professional as soon as possible:  allergic reactions like skin rash, itching or hives, swelling of the face, lips, or tongue  changes in vision  confusion    decreased sweating or heat intolerance  depression  fast, irregular heartbeat  hallucinations  memory loss  muscle weakness  pain or difficulty passing urine  vomiting Side effects that usually do not require medical attention (report to your doctor or health care  professional if they continue or are bothersome):  constipation  dry mouth  nausea This list may not describe all possible side effects. Call your doctor for medical advice about side effects. You may report side effects to FDA at 1-800-FDA-1088. Where should I keep my medicine? Keep out of the reach of children. Store below 30 degrees C (86 degrees F). Keep container tightly closed. Throw away any unused medicine after the expiration date. NOTE: This sheet is a summary. It may not cover all possible information. If you have questions about this medicine, talk to your doctor, pharmacist, or health care provider.  2021 Elsevier/Gold Standard (2008-01-18 15:38:20)  

## 2021-03-19 ENCOUNTER — Other Ambulatory Visit: Payer: Self-pay

## 2021-03-19 ENCOUNTER — Encounter: Payer: Self-pay | Admitting: Student in an Organized Health Care Education/Training Program

## 2021-03-19 ENCOUNTER — Ambulatory Visit
Payer: Medicaid Other | Attending: Student in an Organized Health Care Education/Training Program | Admitting: Student in an Organized Health Care Education/Training Program

## 2021-03-19 VITALS — BP 112/46 | HR 58 | Temp 97.2°F | Resp 16 | Ht 60.0 in | Wt 194.0 lb

## 2021-03-19 DIAGNOSIS — G894 Chronic pain syndrome: Secondary | ICD-10-CM | POA: Diagnosis not present

## 2021-03-19 DIAGNOSIS — M170 Bilateral primary osteoarthritis of knee: Secondary | ICD-10-CM | POA: Insufficient documentation

## 2021-03-19 MED ORDER — SODIUM HYALURONATE (VISCOSUP) 20 MG/2ML IX SOSY: 2.0000 mL | PREFILLED_SYRINGE | Freq: Once | INTRA_ARTICULAR | Status: DC

## 2021-03-19 MED ORDER — LIDOCAINE HCL (PF) 1 % IJ SOLN: 5.0000 mL | Freq: Once | INTRAMUSCULAR | Status: DC

## 2021-03-19 NOTE — Progress Notes (Signed)
Safety precautions to be maintained throughout the outpatient stay will include: orient to surroundings, keep bed in low position, maintain call bell within reach at all times, provide assistance with transfer out of bed and ambulation.  

## 2021-03-19 NOTE — Addendum Note (Signed)
Addended by: [name] on: 03/19/2021 01:23 PM   Modules accepted: Orders

## 2021-03-19 NOTE — Patient Instructions (Signed)
Virtual appt. In 3 weeks

## 2021-03-19 NOTE — Progress Notes (Signed)
PROVIDER NOTE: Information contained herein reflects review and annotations entered in association with encounter. Interpretation of such information and data should be left to medically-trained personnel. Information provided to patient can be located elsewhere in the medical record under "Patient Instructions". Document created using STT-dictation technology, any transcriptional errors that may result from process are unintentional.    Patient: Margaret [name]  Service Category: Procedure  Provider: Gillis Santa, MD  DOB: 1956-08-31  DOS: 03/19/2021  Location: [facility]  MRN: [id number]  Setting: Ambulatory - outpatient  Referring Provider: Rusty Aus, MD  Type: Established Patient  Specialty: Interventional Pain Management  PCP: Rusty Aus, MD   Primary Reason for Visit: Interventional Pain Management Treatment. CC: Knee Pain (bilateral)  Procedure:          Anesthesia, Analgesia, Anxiolysis:  Type: Therapeutic Intra-Articular Hyalgan Knee Injection #2 (#1 done in October 2021) Region: Medial infrapatellar Knee Region Level: Knee Joint Laterality: Bilateral  Type: Local Anesthesia Indication(s): Analgesia         Local Anesthetic: Lidocaine 1-2% Route: Infiltration ([place]/IM) IV Access: Declined Sedation: Declined   Position: Sitting   Indications: 1. Bilateral primary osteoarthritis of knee   2. Chronic pain syndrome    Pain Score: Pre-procedure: 8 /10 Post-procedure: 8 /10   Pre-op Assessment:  Margaret [name] is a 65 y.o. (year old), female patient, seen today for interventional treatment. She  has a past surgical history that includes Abdominal hysterectomy; Tonsillectomy; Cholecystectomy; Esophagogastroduodenoscopy (egd) with propofol (N/A, 06/23/2019); and Colonoscopy with propofol (N/A, 06/23/2019). Margaret [name] has a current medication list which includes the following prescription(s): acetaminophen, alendronate, amlodipine, aripiprazole, b-complex with vitamin  c, benztropine, bisacodyl, bisoprolol-hydrochlorothiazide, cetirizine, cyclobenzaprine, trulicity, escitalopram, ferrous sulfate, furosemide, gabapentin, glimepiride, [START ON 05/09/2021] hydrocodone-acetaminophen, hydroxyzine, insulin glargine, lamotrigine, losartan, meclizine, meloxicam, metformin, pantoprazole, spironolactone, and tizanidine, and the following Facility-Administered Medications: lidocaine (pf), sodium hyaluronate, and sodium hyaluronate. Her primarily concern today is the Knee Pain (bilateral)  Initial Vital Signs:  Pulse/HCG Rate: (!) 58  Temp: (!) 96.8 F (36 C) Resp: 16 BP: (!) 114/44 SpO2: 96 %  BMI: Estimated body mass index is 37.89 kg/m as calculated from the following:   Height as of this encounter: 5' (1.524 m).   Weight as of this encounter: 194 lb (88 kg).  Risk Assessment: Allergies: Reviewed. She is allergic to lithium, penicillins, and sulfa antibiotics.  Allergy Precautions: None required Coagulopathies: Reviewed. None identified.  Blood-thinner therapy: None at this time Active Infection(s): Reviewed. None identified. Margaret [name] is afebrile  Site Confirmation: Margaret [name] was asked to confirm the procedure and laterality before marking the site Procedure checklist: Completed Consent: Before the procedure and under the influence of no sedative(s), amnesic(s), or anxiolytics, the patient was informed of the treatment options, risks and possible complications. To fulfill our ethical and legal obligations, as recommended by the American Medical Association's Code of Ethics, I have informed the patient of my clinical impression; the nature and purpose of the treatment or procedure; the risks, benefits, and possible complications of the intervention; the alternatives, including doing nothing; the risk(s) and benefit(s) of the alternative treatment(s) or procedure(s); and the risk(s) and benefit(s) of doing nothing. The patient was provided information about the  general risks and possible complications associated with the procedure. These may include, but are not limited to: failure to achieve desired goals, infection, bleeding, organ or nerve damage, allergic reactions, paralysis, and death. In addition, the patient was informed of those risks and complications associated to  the procedure, such as failure to decrease pain; infection; bleeding; organ or nerve damage with subsequent damage to sensory, motor, and/or autonomic systems, resulting in permanent pain, numbness, and/or weakness of one or several areas of the body; allergic reactions; (i.e.: anaphylactic reaction); and/or death. Furthermore, the patient was informed of those risks and complications associated with the medications. These include, but are not limited to: allergic reactions (i.e.: anaphylactic or anaphylactoid reaction(s)); adrenal axis suppression; blood sugar elevation that in diabetics may result in ketoacidosis or comma; water retention that in patients with history of congestive heart failure may result in shortness of breath, pulmonary edema, and decompensation with resultant heart failure; weight gain; swelling or edema; medication-induced neural toxicity; particulate matter embolism and blood vessel occlusion with resultant organ, and/or nervous system infarction; and/or aseptic necrosis of one or more joints. Finally, the patient was informed that Medicine is not an exact science; therefore, there is also the possibility of unforeseen or unpredictable risks and/or possible complications that may result in a catastrophic outcome. The patient indicated having understood very clearly. We have given the patient no guarantees and we have made no promises. Enough time was given to the patient to ask questions, all of which were answered to the patient's satisfaction. Margaret [name] has indicated that she wanted to continue with the procedure. Attestation: I, the ordering provider, attest that I  have discussed with the patient the benefits, risks, side-effects, alternatives, likelihood of achieving goals, and potential problems during recovery for the procedure that I have provided informed consent. Date  Time: 03/19/2021 11:17 AM  Pre-Procedure Preparation:  Monitoring: As per clinic protocol. Respiration, ETCO2, SpO2, BP, heart rate and rhythm monitor placed and checked for adequate function Safety Precautions: Patient was assessed for positional comfort and pressure points before starting the procedure. Time-out: I initiated and conducted the "Time-out" before starting the procedure, as per protocol. The patient was asked to participate by confirming the accuracy of the "Time Out" information. Verification of the correct person, site, and procedure were performed and confirmed by me, the nursing staff, and the patient. "Time-out" conducted as per Joint Commission's Universal Protocol (UP.01.01.01). Time: 1148  Description of Procedure:          Target Area: Knee Joint Approach: Just above the Medial tibial plateau, lateral to the infrapatellar tendon. Area Prepped: Entire knee area, from the mid-thigh to the mid-shin. DuraPrep (Iodine Povacrylex [0.7% available iodine] and Isopropyl Alcohol, 74% w/w) Safety Precautions: Aspiration looking for blood return was conducted prior to all injections. At no point did we inject any substances, as a needle was being advanced. No attempts were made at seeking any paresthesias. Safe injection practices and needle disposal techniques used. Medications properly checked for expiration dates. SDV (single dose vial) medications used. Description of the Procedure: Protocol guidelines were followed. The patient was placed in position over the fluoroscopy table. The target area was identified and the area prepped in the usual manner. Skin & deeper tissues infiltrated with local anesthetic. Appropriate amount of time allowed to pass for local anesthetics to  take effect. The procedure needles were then advanced to the target area. Proper needle placement secured. Negative aspiration confirmed. Solution injected in intermittent fashion, asking for systemic symptoms every 0.5cc of injectate. The needles were then removed and the area cleansed, making sure to leave some of the prepping solution back to take advantage of its long term bactericidal properties. Vitals:   03/19/21 1123 03/19/21 1129  BP: (!) 114/44 (!) 112/46  Pulse: (!) 58 (!) 58  Resp: 16 16  Temp: (!) 96.8 F (36 C) (!) 97.2 F (36.2 C)  TempSrc: Temporal Temporal  SpO2: 96% 100%  Weight: 194 lb (88 kg)   Height: 5' (1.524 m)     Start Time: 1149 hrs. End Time: 1149 hrs. Materials:  Needle(s) Type: Regular needle Gauge: 25G Length: 1.5-in Medication(s): Please see orders for medications and dosing details.  Post-operative Assessment:  Post-procedure Vital Signs:  Pulse/HCG Rate: (!) 58  Temp: (!) 97.2 F (36.2 C) Resp: 16 BP: (!) 112/46 SpO2: 100 %  EBL: None  Complications: No immediate post-treatment complications observed by team, or reported by patient.  Note: The patient tolerated the entire procedure well. A repeat set of vitals were taken after the procedure and the patient was kept under observation following institutional policy, for this type of procedure. Post-procedural neurological assessment was performed, showing return to baseline, prior to discharge. The patient was provided with post-procedure discharge instructions, including a section on how to identify potential problems. Should any problems arise concerning this procedure, the patient was given instructions to immediately contact us, at any time, without hesitation. In any case, we plan to contact the patient by telephone for a follow-up status report regarding this interventional procedure.  Comments:  No additional relevant information.  Plan of Care  Chronic Opioid Analgesic:  Norco 10 mg TID  prn   Medications ordered for procedure: Meds ordered this encounter  Medications  . Sodium Hyaluronate SOSY 2 mL  . Sodium Hyaluronate SOSY 2 mL  . lidocaine (PF) (XYLOCAINE) 1 % injection 5 mL   Medications administered: Margaret [name] had no medications administered during this visit.  See the medical record for exact dosing, route, and time of administration.  Follow-up plan:   Return in 3 weeks (on 04/09/2021), or VIRTUAL F/U, for Keep sch. appt.       Interventional management options: Planned, scheduled, and/or pending:    Intra-articular knee Hyalgan series #1 done 08/28/20   Considering:   Lumbar epidural steroid injection, lumbar facet medial branch nerve block pending lumbar MRI   PRN Procedures:   None at this time     Recent Visits Date Type Provider Dept  02/25/21 Office Visit Gillis Santa, MD [facility]  Showing recent visits within past 90 days and meeting all other requirements Today's Visits Date Type Provider Dept  03/19/21 Procedure visit Gillis Santa, MD [facility]  Showing today's visits and meeting all other requirements Future Appointments Date Type Provider Dept  04/09/21 Appointment Gillis Santa, MD [facility]  05/27/21 Appointment Gillis Santa, MD [facility]  Showing future appointments within next 90 days and meeting all other requirements  Disposition: Discharge home  Discharge (Date  Time): 03/19/2021; 1158 hrs.   Primary Care Physician: Rusty Aus, MD Location: [facility] Note by: Gillis Santa, MD Date: 03/19/2021; Time: 12:51 PM  Disclaimer:  Medicine is not an exact science. The only guarantee in medicine is that nothing is guaranteed. It is important to note that the decision to proceed with this intervention was based on the information collected from the patient. The Data and conclusions were drawn from the patient's questionnaire, the interview, and the  physical examination. Because the information was provided in large part by the patient, it cannot be guaranteed that it has not been purposely or unconsciously manipulated. Every effort has been made to obtain as much relevant data as possible for  this evaluation. It is important to note that the conclusions that lead to this procedure are derived in large part from the available data. Always take into account that the treatment will also be dependent on availability of resources and existing treatment guidelines, considered by other Pain Management Practitioners as being common knowledge and practice, at the time of the intervention. For Medico-Legal purposes, it is also important to point out that variation in procedural techniques and pharmacological choices are the acceptable norm. The indications, contraindications, technique, and results of the above procedure should only be interpreted and judged by a Board-Certified Interventional Pain Specialist with extensive familiarity and expertise in the same exact procedure and technique.

## 2021-03-20 ENCOUNTER — Telehealth: Payer: Self-pay | Admitting: *Deleted

## 2021-03-20 ENCOUNTER — Telehealth: Payer: Self-pay

## 2021-03-20 NOTE — Telephone Encounter (Signed)
No problems post procedure. 

## 2021-03-20 NOTE — Telephone Encounter (Signed)
Received request from Pain Management that patient was in need of resources.  Spoke with patient who reports some mobility issues needing assistance with cleaning and errands such as grocery shopping.  Patient may benefit from personal care services to assist.  Will mail resource list of PCS agencies to patient.  Encouraged her to call them for details regarding pricing.

## 2021-04-04 ENCOUNTER — Telehealth: Payer: Self-pay | Admitting: Psychiatry

## 2021-04-04 ENCOUNTER — Other Ambulatory Visit: Payer: Self-pay

## 2021-04-04 ENCOUNTER — Telehealth: Payer: Medicaid Other | Admitting: Psychiatry

## 2021-04-04 NOTE — Telephone Encounter (Signed)
Contacted patient since I received a message from staff this morning that patient has been laying on the floor for 2 hours and wanted to cancel her appointment for today.  Patient picked up the phone when writer contacted her.  She appeared to be alert, oriented and was able to answer questions appropriately.  She reports she got help and is currently OK and she is going to call her primary doctor for an evaluation.  She feels fine at this time.  Patient to call back to reschedule this appointment.

## 2021-04-09 ENCOUNTER — Ambulatory Visit
Payer: Medicaid Other | Attending: Student in an Organized Health Care Education/Training Program | Admitting: Student in an Organized Health Care Education/Training Program

## 2021-04-09 ENCOUNTER — Other Ambulatory Visit: Payer: Self-pay

## 2021-04-09 DIAGNOSIS — M542 Cervicalgia: Secondary | ICD-10-CM | POA: Diagnosis not present

## 2021-04-09 DIAGNOSIS — M5412 Radiculopathy, cervical region: Secondary | ICD-10-CM

## 2021-04-09 DIAGNOSIS — M170 Bilateral primary osteoarthritis of knee: Secondary | ICD-10-CM | POA: Diagnosis not present

## 2021-04-09 DIAGNOSIS — M47816 Spondylosis without myelopathy or radiculopathy, lumbar region: Secondary | ICD-10-CM

## 2021-04-09 DIAGNOSIS — M25511 Pain in right shoulder: Secondary | ICD-10-CM | POA: Diagnosis not present

## 2021-04-09 DIAGNOSIS — G894 Chronic pain syndrome: Secondary | ICD-10-CM

## 2021-04-09 DIAGNOSIS — Z79891 Long term (current) use of opiate analgesic: Secondary | ICD-10-CM

## 2021-04-09 NOTE — Progress Notes (Signed)
Patient: Margaret [name]  Service Category: E/M  Provider: Gillis Santa, MD  DOB: 1956/09/09  DOS: 04/09/2021  Location: Office  MRN: [id number]  Setting: Ambulatory outpatient  Referring Provider: Rusty Aus, MD  Type: Established Patient  Specialty: Interventional Pain Management  PCP: Rusty Aus, MD  Location: Home  Delivery: TeleHealth     Virtual Encounter - Pain Management PROVIDER NOTE: Information contained herein reflects review and annotations entered in association with encounter. Interpretation of such information and data should be left to medically-trained personnel. Information provided to patient can be located elsewhere in the medical record under "Patient Instructions". Document created using STT-dictation technology, any transcriptional errors that may result from process are unintentional.    Contact & Pharmacy Preferred: [place]: [phone] (home) Mobile: [phone] (mobile) E-mail: [email or web address]  [place], [place] [address] Phone: [phone] Fax: [phone]   Pre-screening  Margaret [name] offered "in-person" vs "virtual" encounter. She indicated preferring virtual for this encounter.   Reason COVID-19*  Social distancing based on CDC and AMA recommendations.   I contacted Margaret [name] on 04/09/2021 via video conference.      I clearly identified myself as Gillis Santa, MD. I verified that I was speaking with the correct person using two identifiers (Name: Margaret [name], and date of birth: [birth year]).  Consent I sought verbal advanced consent from Margaret [name] for virtual visit interactions. I informed Margaret [name] of possible security and privacy concerns, risks, and limitations associated with providing "not-in-person" medical evaluation and management services. I also informed Margaret [name] of the availability of "in-person" appointments. Finally, I informed her that there would be a  charge for the virtual visit and that she could be  personally, fully or partially, financially responsible for it. Margaret [name] expressed understanding and agreed to proceed.   Historic Elements   Margaret [name] is a 65 y.o. year old, female patient evaluated today after our last contact on 03/19/2021. Margaret [name]  has a past medical history of Bipolar 1 disorder ([place]), Diabetes mellitus without complication ([place]), and Hypertension. She also  has a past surgical history that includes Abdominal hysterectomy; Tonsillectomy; Cholecystectomy; Esophagogastroduodenoscopy (egd) with propofol (N/A, 06/23/2019); and Colonoscopy with propofol (N/A, 06/23/2019). Margaret [name] has a current medication list which includes the following prescription(s): acetaminophen, alendronate, amlodipine, aripiprazole, b-complex with vitamin c, benztropine, bisacodyl, bisoprolol-hydrochlorothiazide, cetirizine, cyclobenzaprine, trulicity, escitalopram, furosemide, gabapentin, [START ON 05/09/2021] hydrocodone-acetaminophen, hydroxyzine, insulin glargine, lamotrigine, losartan, meclizine, meloxicam, metformin, pantoprazole, spironolactone, tizanidine, ferrous sulfate, and glimepiride. She  reports that she quit smoking about 10 years ago. Her smoking use included cigarettes. She has never used smokeless tobacco. She reports previous alcohol use. She reports previous drug use. Margaret [name] is allergic to lithium, penicillins, and sulfa antibiotics.   HPI  Today, she is being contacted for a post-procedure assessment.    Post-Procedure Evaluation  Procedure (03/19/2021):   Type: Therapeutic Intra-Articular Hyalgan Knee Injection #2 (#1 done in October 2021) Region: Medial infrapatellar Knee Region Level: Knee Joint Laterality: Bilateral  Sedation: Please see nurses note.  Effectiveness during initial hour after procedure(Ultra-Short Term Relief): 0 %   Local anesthetic used: Long-acting (4-6 hours) Effectiveness: Defined as any  analgesic benefit obtained secondary to the administration of local anesthetics. This carries significant diagnostic value as to the etiological location, or anatomical origin, of the pain. Duration of benefit is expected to coincide with the duration of the local anesthetic used.  Effectiveness during initial  4-6 hours after procedure(Short-Term Relief): 0 %   Long-term benefit: Defined as any relief past the pharmacologic duration of the local anesthetics.  Effectiveness past the initial 6 hours after procedure(Long-Term Relief): 100 % (the knee improvement did not happen until 2 weeks after injections.  patient has fallen on this past Friday and is now back in a great deal of pain.  is having to use a walker to ambulate.)  Current benefits: Defined as benefit that persist at this time.   Analgesia:  <50% better Function: Somewhat improved  Pharmacotherapy Assessment  Analgesic: Norco 10 mg TID prn   Monitoring: [place] PMP: PDMP reviewed during this encounter.       Pharmacotherapy: No side-effects or adverse reactions reported. Compliance: No problems identified. Effectiveness: Clinically acceptable. Plan: Refer to "POC".  UDS:  Summary  Date Value Ref Range Status  06/20/2020 Note  Final    Comment:    ==================================================================== Compliance Drug Analysis, Ur ==================================================================== Test                             Result       Flag       Units  Drug Present and Declared for Prescription Verification   Hydrocodone                    2260         EXPECTED   ng/mg creat   Norhydrocodone                 2128         EXPECTED   ng/mg creat    Sources of hydrocodone include scheduled prescription medications.    Norhydrocodone is an expected metabolite of hydrocodone.    Gabapentin                     PRESENT      EXPECTED   Lamotrigine                    PRESENT      EXPECTED   Citalopram                      PRESENT      EXPECTED   Desmethylcitalopram            PRESENT      EXPECTED    Desmethylcitalopram is an expected metabolite of citalopram or the    enantiomeric form, escitalopram.    Aripiprazole                   PRESENT      EXPECTED   Acetaminophen                  PRESENT      EXPECTED  Drug Present not Declared for Prescription Verification   Naproxen                       PRESENT      UNEXPECTED  Drug Absent but Declared for Prescription Verification   Mirtazapine                    Not Detected UNEXPECTED   Propranolol                    Not Detected UNEXPECTED ==================================================================== Test  Result    Flag   Units      Ref Range   Creatinine              25               mg/dL      >=20 ==================================================================== Declared Medications:  The flagging and interpretation on this report are based on the  following declared medications.  Unexpected results may arise from  inaccuracies in the declared medications.   **Note: The testing scope of this panel includes these medications:   Aripiprazole (Abilify)  Citalopram (Celexa)  Gabapentin  Hydrocodone (Norco)  Lamotrigine (Lamictal)  Mirtazapine (Remeron)  Propranolol   **Note: The testing scope of this panel does not include small to  moderate amounts of these reported medications:   Acetaminophen (Tylenol)  Acetaminophen (Norco)   **Note: The testing scope of this panel does not include the  following reported medications:   Alendronate (Fosamax)  Azithromycin  Cetirizine (Zyrtec)  Clindamycin (Cleocin)  Dulaglutide (Trulicity)  Ertugliflozin (Steglatro)  Fluconazole (Diflucan)  Furosemide (Lasix)  Glimepiride  Hydrochlorothiazide  Insulin (Lantus)  Losartan (Cozaar)  Meclizine (Antivert)  Meloxicam (Mobic)  Metformin  Pantoprazole (Protonix)  Spironolactone  Vitamin B  Vitamin  C ==================================================================== For clinical consultation, please call [phone]. ====================================================================     Laboratory Chemistry Profile   Renal Lab Results  Component Value Date   BUN 11 12/06/2019   CREATININE 0.59 12/06/2019   BCR 19 12/06/2019   GFRAA 114 12/06/2019   GFRNONAA 99 12/06/2019     Hepatic Lab Results  Component Value Date   AST 48 (H) 12/06/2019   ALT 47 (H) 12/06/2019   ALBUMIN 3.7 (L) 12/06/2019   ALKPHOS 122 (H) 12/06/2019     Electrolytes Lab Results  Component Value Date   NA 137 12/06/2019   K 3.9 12/06/2019   CL 101 12/06/2019   CALCIUM 9.7 12/06/2019     Bone No results found for: VD25OH, VD125OH2TOT, WC5852DP8, EU2353IR4, 25OHVITD1, 25OHVITD2, 25OHVITD3, TESTOFREE, TESTOSTERONE   Inflammation (CRP: Acute Phase) (ESR: Chronic Phase) No results found for: CRP, ESRSEDRATE, LATICACIDVEN     Note: Above Lab results reviewed.  Imaging  US Carotid Bilateral CLINICAL DATA:  Carotid atherosclerosis, hypertension, diabetes  EXAM: BILATERAL CAROTID DUPLEX ULTRASOUND  TECHNIQUE: Pearline Cables scale imaging, color Doppler and duplex ultrasound were performed of bilateral carotid and vertebral arteries in the neck.  COMPARISON:  None.  FINDINGS: Criteria: Quantification of carotid stenosis is based on velocity parameters that correlate the residual internal carotid diameter with NASCET-based stenosis levels, using the diameter of the distal internal carotid lumen as the denominator for stenosis measurement.  The following velocity measurements were obtained:  RIGHT  ICA: 130/38 cm/sec  CCA: 431/54 cm/sec  SYSTOLIC ICA/CCA RATIO:  1.1  ECA: 226 cm/sec  LEFT  ICA: 175/41 cm/sec  CCA: 008/67 cm/sec  SYSTOLIC ICA/CCA RATIO:  1.5  ECA: 153 cm/sec  RIGHT CAROTID ARTERY: Diffuse mild intimal thickening and minor atherosclerotic change. No  hemodynamically significant right ICA stenosis, velocity elevation, turbulent flow. Degree of narrowing less than 50% by ultrasound criteria.  RIGHT VERTEBRAL ARTERY:  Normal antegrade flow  LEFT CAROTID ARTERY: Similar intimal thickening and minor atherosclerotic change. Slight distal ICA velocity elevation as above but no significant grayscale abnormality, luminal narrowing, turbulent flow, or spectral broadening. Degree of stenosis still estimated less than 50%.  LEFT VERTEBRAL ARTERY:  Normal antegrade flow  IMPRESSION: Bilateral carotid atherosclerosis. No hemodynamically significant ICA stenosis. Degree  of narrowing less than 50% bilaterally by ultrasound criteria.  Patent antegrade vertebral flow bilaterally  Electronically Signed   By: Jerilynn Mages.  Shick M.D.   On: 12/25/2020 14:44  Assessment  The primary encounter diagnosis was Bilateral primary osteoarthritis of knee. Diagnoses of Cervical radicular pain, Neck pain on right side, Pain in joint of right shoulder, Lumbar facet arthropathy, Encounter for long-term opiate analgesic use, and Chronic pain syndrome were also pertinent to this visit.  Plan of Care  Ms. Nehemiah Montee has a current medication list which includes the following long-term medication(s): aripiprazole, benztropine, bisoprolol-hydrochlorothiazide, cetirizine, escitalopram, ferrous sulfate, furosemide, gabapentin, glimepiride, lamotrigine, losartan, metformin, pantoprazole, and spironolactone.  As needed order placed for repeat intra-articular viscosupplementation for bilateral knee, we will continue to monitor how she does Otherwise keep scheduled appointment for medication management next month  Orders:  Orders Placed This Encounter  Procedures  . KNEE INJECTION    Monovisc    Standing Status:   Standing    Number of Occurrences:   3    Standing Expiration Date:   07/10/2021    Scheduling Instructions:     Procedure: Intra-articular Monovisc Knee injection             Side: Bilateral     Sedation: None     prn    Order Specific Question:   Where will this procedure be performed?    Answer:   [facility]   Follow-up plan:   Return for [facility]. appt.      Interventional management options: Planned, scheduled, and/or pending:    Intra-articular knee Hyalgan series #1 done 08/28/20, #2 03/19/21   Considering:   Lumbar epidural steroid injection, lumbar facet medial branch nerve block pending lumbar MRI   PRN Procedures:   None at this time      Recent Visits Date Type Provider Dept  03/19/21 Procedure visit Gillis Santa, MD [facility]  02/25/21 Office Visit Gillis Santa, MD [facility]  Showing recent visits within past 90 days and meeting all other requirements Today's Visits Date Type Provider Dept  04/09/21 Telemedicine Gillis Santa, MD [facility]  Showing today's visits and meeting all other requirements Future Appointments Date Type Provider Dept  05/27/21 Appointment Gillis Santa, MD [facility]  Showing future appointments within next 90 days and meeting all other requirements  I discussed the assessment and treatment plan with the patient. The patient was provided an opportunity to ask questions and all were answered. The patient agreed with the plan and demonstrated an understanding of the instructions.  Patient advised to call back or seek an in-person evaluation if the symptoms or condition worsens.  Duration of encounter: 20 minutes.  Note by: Gillis Santa, MD Date: 04/09/2021; Time: 1:51 PM

## 2021-04-21 ENCOUNTER — Other Ambulatory Visit: Payer: Self-pay

## 2021-04-21 ENCOUNTER — Encounter: Payer: Self-pay | Admitting: Psychiatry

## 2021-04-21 ENCOUNTER — Telehealth (INDEPENDENT_AMBULATORY_CARE_PROVIDER_SITE_OTHER): Payer: Medicaid Other | Admitting: Psychiatry

## 2021-04-21 DIAGNOSIS — R251 Tremor, unspecified: Secondary | ICD-10-CM | POA: Diagnosis not present

## 2021-04-21 DIAGNOSIS — F410 Panic disorder [episodic paroxysmal anxiety] without agoraphobia: Secondary | ICD-10-CM | POA: Diagnosis not present

## 2021-04-21 DIAGNOSIS — F3176 Bipolar disorder, in full remission, most recent episode depressed: Secondary | ICD-10-CM | POA: Diagnosis not present

## 2021-04-21 MED ORDER — HYDROXYZINE PAMOATE 25 MG PO CAPS
ORAL_CAPSULE | ORAL | 0 refills | Status: DC
Start: 2021-04-21 — End: 2021-09-09

## 2021-04-21 MED ORDER — ESCITALOPRAM OXALATE 10 MG PO TABS: 15.0000 mg | ORAL_TABLET | Freq: Every day | ORAL | 0 refills | Status: DC

## 2021-04-21 MED ORDER — GABAPENTIN 600 MG PO TABS: 600.0000 mg | ORAL_TABLET | Freq: Every day | ORAL | 0 refills | Status: DC

## 2021-04-21 MED ORDER — ARIPIPRAZOLE 15 MG PO TABS: 7.5000 mg | ORAL_TABLET | Freq: Every day | ORAL | 0 refills | Status: DC

## 2021-04-21 NOTE — Progress Notes (Signed)
Virtual Visit via Video Note  I connected with Margaret [name] on 04/21/21 at 10:40 AM EDT by a video enabled telemedicine application and verified that I am speaking with the correct person using two identifiers.  Location Provider Location : [facility] Patient Location : Home  Participants: Patient , Provider   I discussed the limitations of evaluation and management by telemedicine and the availability of in person appointments. The patient expressed understanding and agreed to proceed.    I discussed the assessment and treatment plan with the patient. The patient was provided an opportunity to ask questions and all were answered. The patient agreed with the plan and demonstrated an understanding of the instructions.   The patient was advised to call back or seek an in-person evaluation if the symptoms worsen or if the condition fails to improve as anticipated.                                                               [place] MD OP Progress Note  04/21/2021 10:53 AM Margaret [name]  MRN:  [id number]  Chief Complaint:  Chief Complaint   Follow-up; Anxiety; Depression    HPI: Margaret [name] is a 65 year old Caucasian female on disability, divorced, lives in [place], has a history of bipolar disorder, hyperlipidemia, bereavement, panic attacks, chronic pain was evaluated by telemedicine today.  Patient today reports she is currently making progress with regards to her mood.  Denies any significant depression or panic attacks.  Reports sleep is overall good.  She is compliant on medications as prescribed.  Patient does report tremors unknown if tremors are medication induced or not.  She is taking benztropine as needed which helps.  The tremors are not present all the time, is worsened by anxiety or social situations.  She had a fall a few weeks ago, continues to struggle with back pain and hip pain.  She will continue to follow-up with pain management as well as a primary care provider.  She  had recent medication readjustment done, her blood pressure medication amlodipine was reduced.  Patient denies any other concerns today.  Visit Diagnosis:    ICD-10-CM   1. Bipolar disorder, in full remission, most recent episode depressed (HCC)  F31.76 ARIPiprazole (ABILIFY) 15 MG tablet    2. Panic attacks  F41.0 escitalopram (LEXAPRO) 10 MG tablet    hydrOXYzine (VISTARIL) 25 MG capsule    3. Tremor  R25.1 gabapentin (NEURONTIN) 600 MG tablet      Past Psychiatric History: I have reviewed past psychiatric history from progress note on 08/21/2019.  Past trials of Depakote, lithium, risperidone, Xanax  Past Medical History:  Past Medical History:  Diagnosis Date   Bipolar 1 disorder ([place])    Diabetes mellitus without complication ([place])    Type 2   Hypertension     Past Surgical History:  Procedure Laterality Date   ABDOMINAL HYSTERECTOMY     CHOLECYSTECTOMY     COLONOSCOPY WITH PROPOFOL N/A 06/23/2019   Procedure: COLONOSCOPY WITH PROPOFOL;  Surgeon: Jonathon Bellows, MD;  [facility] ENDOSCOPY;  Service: Gastroenterology;  Laterality: N/A;   ESOPHAGOGASTRODUODENOSCOPY (EGD) WITH PROPOFOL N/A 06/23/2019   Procedure: ESOPHAGOGASTRODUODENOSCOPY (EGD) WITH PROPOFOL;  Surgeon: Jonathon Bellows, MD;  Location: [facility] ENDOSCOPY;  Service: Gastroenterology;  Laterality: N/A;   TONSILLECTOMY  Family Psychiatric History: Reviewed family psychiatric history from progress note on 08/21/2019  Family History:  Family History  Problem Relation Age of Onset   Mental illness Father    Bipolar disorder Brother    Bipolar disorder Paternal Uncle    Bipolar disorder Paternal Uncle     Social History: Reviewed social history from progress note on 08/21/2019 Social History   Socioeconomic History   Marital status: Divorced    Spouse name: Not on file   Number of children: 2   Years of education: Not on file   Highest education level: Not on file  Occupational History   Not on file   Tobacco Use   Smoking status: Former    Pack years: 0.00    Types: Cigarettes    Quit date: 03/03/2011    Years since quitting: 10.1   Smokeless tobacco: Never  Vaping Use   Vaping Use: Never used  Substance and Sexual Activity   Alcohol use: Not Currently   Drug use: Not Currently   Sexual activity: Not on file  Other Topics Concern   Not on file  Social History Narrative   Not on file   Social Determinants of Health   Financial Resource Strain: Not on file  Food Insecurity: Not on file  Transportation Needs: Not on file  Physical Activity: Not on file  Stress: Not on file  Social Connections: Not on file    Allergies:  Allergies  Allergen Reactions   Lithium Nausea And Vomiting   Penicillins Hives    Resulted in hospitalization   Sulfa Antibiotics Hives    Resulted in hospitalization    Metabolic Disorder Labs: Lab Results  Component Value Date   HGBA1C 7.3 (H) 06/22/2019   MPG 162.81 06/22/2019   No results found for: PROLACTIN No results found for: CHOL, TRIG, HDL, CHOLHDL, VLDL, LDLCALC Lab Results  Component Value Date   TSH 1.737 11/16/2019    Therapeutic Level Labs: No results found for: LITHIUM No results found for: VALPROATE No components found for:  CBMZ  Current Medications: Current Outpatient Medications  Medication Sig Dispense Refill   acetaminophen (TYLENOL) 500 MG tablet Take 500 mg by mouth every 6 (six) hours as needed.      alendronate (FOSAMAX) 35 MG tablet Take 35 mg by mouth every 7 (seven) days. Take with a full glass of water on an empty stomach.     amLODipine (NORVASC) 5 MG tablet Take 2.5 mg by mouth.     amLODipine (NORVASC) 5 MG tablet Take by mouth. (Patient not taking: Reported on 04/21/2021)     ARIPiprazole (ABILIFY) 15 MG tablet Take 0.5 tablets (7.5 mg total) by mouth daily. 45 tablet 0   B Complex-C (B-COMPLEX WITH VITAMIN C) tablet Take 1 tablet by mouth daily. 30 tablet 2   benztropine (COGENTIN) 0.5 MG tablet Take  1 tablet (0.5 mg total) by mouth daily as needed for tremors. 15 tablet 1   bisacodyl (DULCOLAX) 5 MG EC tablet Take 5 mg by mouth daily as needed for moderate constipation.     bisoprolol-hydrochlorothiazide (ZIAC) 5-6.25 MG tablet Take 1 tablet by mouth daily.     cetirizine (ZYRTEC) 10 MG tablet Take 10 mg by mouth daily.     cyclobenzaprine (FLEXERIL) 10 MG tablet TAKE 1 TABLET BY MOUTHATHREE TIMES A DAY AS NEEDED.     Dulaglutide (TRULICITY) 7.01 XB/9.3JQ SOPN Inject 0.75 mg into the skin once a week. Thursday     escitalopram (LEXAPRO)  10 MG tablet Take 1.5 tablets (15 mg total) by mouth daily. 135 tablet 0   ferrous sulfate 325 (65 FE) MG tablet Take 1 tablet (325 mg total) by mouth 2 (two) times daily with a meal. 120 tablet 0   furosemide (LASIX) 20 MG tablet Take 10 mg by mouth daily as needed.     gabapentin (NEURONTIN) 600 MG tablet Take 1 tablet (600 mg total) by mouth daily. 270 tablet 0   glimepiride (AMARYL) 2 MG tablet Take 2 mg by mouth daily with breakfast.      [START ON 05/09/2021] HYDROcodone-acetaminophen (NORCO) 10-325 MG tablet Take 1 tablet by mouth 3 (three) times daily. For chronic pain syndrome. To last for 30 days from fill date. 90 tablet 0   hydrOXYzine (VISTARIL) 25 MG capsule TAKE 1 OR 2 CAPSULES BY MOUTH ONCE DAILY AS NEEDED. TAKE ONLY FOR SEVERE PANIC ATTACKS. 60 capsule 0   insulin glargine (LANTUS) 100 UNIT/ML Solostar Pen Inject into the skin.     lamoTRIgine (LAMICTAL) 200 MG tablet Take 1 tablet (200 mg total) by mouth daily. 90 tablet 0   losartan (COZAAR) 25 MG tablet Take 25 mg by mouth daily. Takes differently     meclizine (ANTIVERT) 25 MG tablet Take 25 mg by mouth 2 (two) times daily as needed.     meloxicam (MOBIC) 7.5 MG tablet TAKE 1 TABLET BY MOUTH EVERY DAY     metFORMIN (GLUCOPHAGE) 500 MG tablet Take 500 mg by mouth 2 (two) times daily with a meal.     pantoprazole (PROTONIX) 40 MG tablet Take 40 mg by mouth daily.     spironolactone  (ALDACTONE) 25 MG tablet Take 25 mg by mouth daily.     tiZANidine (ZANAFLEX) 2 MG tablet Take by mouth.     No current facility-administered medications for this visit.     Musculoskeletal: Strength & Muscle Tone:  UTA Gait & Station:  UTA Patient leans: N/A  Psychiatric Specialty Exam: Review of Systems  Musculoskeletal:  Positive for back pain.  Neurological:  Positive for tremors.  Psychiatric/Behavioral:  The patient is nervous/anxious.   All other systems reviewed and are negative.  There were no vitals taken for this visit.There is no height or weight on file to calculate BMI.  General Appearance: Fairly Groomed  Eye Contact:  Fair  Speech:  Clear and Coherent  Volume:  Normal  Mood:  Anxious Coping well  Affect:  Appropriate  Thought Process:  Goal Directed and Descriptions of Associations: Intact  Orientation:  Full (Time, Place, and Person)  Thought Content: Logical   Suicidal Thoughts:  No  Homicidal Thoughts:  No  Memory:  Immediate;   Fair Recent;   Fair Remote;   Fair  Judgement:  Fair  Insight:  Fair  Psychomotor Activity:  Tremor  Concentration:  Concentration: Fair and Attention Span: Fair  Recall:  [employer] of Knowledge: Fair  Language: Fair  Akathisia:  No  Handed:  Right  AIMS (if indicated): not done  Assets:  Communication Skills Desire for Improvement Housing Resilience Social Support Talents/Skills  ADL's:  Intact  Cognition: WNL  Sleep:  Fair   Screenings: [facility] Office Visit from 03/04/2021 in [facility] Total Score 0      PHQ2-9    [facility] Visit from 03/04/2021 in [place] Video Visit from 12/23/2020 in [facility] [place]or from [facility]09[facility]2020 in [facility] Procedure visit from 09/18/2020 in [facility] [place] Procedure visit from 08/28/2020 in  [facility]  PHQ-2 Total Score 1 0 5 0 0  PHQ-9 Total Score -- -- 15 -- --      [facility] Office Visit from 03/04/2021 in [place] Video Visit from 12/23/2020 in [facility] Counselor from 12/09/2020 in [facility] No Risk No Risk No Risk        Assessment and Plan: Margaret [name] is a 65 year old Caucasian female on disability, divorced, lives in [place], has a history of bipolar disorder, chronic pain, multiple medical problems was evaluated by telemedicine today.  Patient is currently making progress however continues to struggle with tremor.  Plan as noted below.  Plan Bipolar disorder in remission Lamotrigine 200 mg p.o. daily Abilify 7.5 mg p.o. daily Gabapentin 600 mg p.o. daily.  She is only taking it once a day.  Panic attacks-improving Lexapro 15 mg p.o. 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 Will reevaluate patient-consider neurology referral.  Patient to continue to follow-up with pain provider.  Follow-up in clinic in office in 2 months.  This note was generated in part or whole with voice recognition software. Voice recognition is usually quite accurate but there are transcription errors that can and very often do occur. I apologize for any typographical errors that were not detected and corrected.      Ursula Alert, MD 04/22/2021, 8:43 AM

## 2021-05-21 ENCOUNTER — Other Ambulatory Visit: Payer: Self-pay | Admitting: Psychiatry

## 2021-05-21 DIAGNOSIS — F410 Panic disorder [episodic paroxysmal anxiety] without agoraphobia: Secondary | ICD-10-CM

## 2021-05-27 ENCOUNTER — Encounter: Payer: Self-pay | Admitting: Student in an Organized Health Care Education/Training Program

## 2021-05-27 ENCOUNTER — Ambulatory Visit
Payer: Medicaid Other | Attending: Student in an Organized Health Care Education/Training Program | Admitting: Student in an Organized Health Care Education/Training Program

## 2021-05-27 ENCOUNTER — Other Ambulatory Visit: Payer: Self-pay

## 2021-05-27 VITALS — BP 124/43 | HR 76 | Temp 96.6°F | Resp 16 | Ht 60.0 in | Wt 200.0 lb

## 2021-05-27 DIAGNOSIS — M5412 Radiculopathy, cervical region: Secondary | ICD-10-CM | POA: Diagnosis not present

## 2021-05-27 DIAGNOSIS — M5410 Radiculopathy, site unspecified: Secondary | ICD-10-CM | POA: Insufficient documentation

## 2021-05-27 DIAGNOSIS — Z79891 Long term (current) use of opiate analgesic: Secondary | ICD-10-CM | POA: Diagnosis not present

## 2021-05-27 DIAGNOSIS — M47816 Spondylosis without myelopathy or radiculopathy, lumbar region: Secondary | ICD-10-CM | POA: Diagnosis present

## 2021-05-27 DIAGNOSIS — G8929 Other chronic pain: Secondary | ICD-10-CM | POA: Insufficient documentation

## 2021-05-27 DIAGNOSIS — G894 Chronic pain syndrome: Secondary | ICD-10-CM | POA: Insufficient documentation

## 2021-05-27 DIAGNOSIS — M5416 Radiculopathy, lumbar region: Secondary | ICD-10-CM | POA: Diagnosis not present

## 2021-05-27 MED ORDER — HYDROCODONE-ACETAMINOPHEN 10-325 MG PO TABS: 1.0000 | ORAL_TABLET | Freq: Three times a day (TID) | ORAL | 0 refills | Status: AC | PRN

## 2021-05-27 MED ORDER — HYDROCODONE-ACETAMINOPHEN 10-325 MG PO TABS: 1.0000 | ORAL_TABLET | Freq: Three times a day (TID) | ORAL | 0 refills | Status: DC | PRN

## 2021-05-27 NOTE — Progress Notes (Signed)
Nursing Pain Medication Assessment:  Safety precautions to be maintained throughout the outpatient stay will include: orient to surroundings, keep bed in low position, maintain call bell within reach at all times, provide assistance with transfer out of bed and ambulation.  Medication Inspection Compliance: Pill count conducted under aseptic conditions, in front of the patient. Neither the pills nor the bottle was removed from the patient's sight at any time. Once count was completed pills were immediately returned to the patient in their original bottle.  Medication: Hydrocodone/APAP Pill/Patch Count:  43 of 90 pills remain Pill/Patch Appearance: Markings consistent with prescribed medication Bottle Appearance: Standard pharmacy container. Clearly labeled. Filled Date: 07 / 08 / 2022 Last Medication intake:  Today

## 2021-05-27 NOTE — Progress Notes (Signed)
PROVIDER NOTE: Information contained herein reflects review and annotations entered in association with encounter. Interpretation of such information and data should be left to medically-trained personnel. Information provided to patient can be located elsewhere in the medical record under "Patient Instructions". Document created using STT-dictation technology, any transcriptional errors that may result from process are unintentional.    Patient: Margaret [name]  Service Category: E/M  Provider: Gillis Santa, MD  DOB: 1956/01/31  DOS: 05/27/2021  Specialty: Interventional Pain Management  MRN: [id number]  Setting: Ambulatory outpatient  PCP: Rusty Aus, MD  Type: Established Patient    Referring Provider: Rusty Aus, MD  Location: Office  Delivery: Face-to-face     HPI  Ms. Jezel Basto, a 65 y.o. year old female, is here today because of her Radicular pain of left lower extremity [M54.10]. Ms. Sautter primary complain today is Hip Pain (Left ), Leg Pain (Bilateral ), and Back Pain (Lumbar bilateral ) Last encounter: My last encounter with her was on 04/09/2021. Pertinent problems: Ms. Haring has Chronic pain syndrome; Encounter for long-term opiate analgesic use; Bilateral primary osteoarthritis of knee; Sacroiliac joint pain; History of left hip replacement; Recurrent major depressive episodes, moderate ([place]); Chronic radicular lumbar pain; Pain management contract signed; Lumbar facet arthropathy; Pain in joint of right shoulder; and Cervical facet joint syndrome on their pertinent problem list. Pain Assessment: Severity of Chronic pain is reported as a 7 /10. Location: Hip (see visit info for additional pain sites.) Left/back pain into the hips. Onset: More than a month ago. Quality: Discomfort, Constant, Aching, Throbbing. Timing: Constant. Modifying factor(s): medications takes the edge off.. Vitals:  height is 5' (1.524 m) and weight is 200 lb (90.7 kg). Her temporal temperature is 96.6 F  (35.9 C) (abnormal). Her blood pressure is 124/43 (abnormal) and her pulse is 76. Her respiration is 16 and oxygen saturation is 98%.   Reason for encounter: medication management.   No change in medical history since last visit.  Patient continues multimodal pain regimen as prescribed.    Patient is endorsing increased low back pain radiation into the left hip and bilateral legs in a dermatomal fashion in a posterior lateral distribution.  We discussed a lumbar epidural steroid injection at L4-L5 her symptoms.  Risks and benefits reviewed and patient would like to proceed.  We will also refill her hydrocodone as below and renew her urine toxicology screen for annual medication compliance monitoring.  Pharmacotherapy Assessment   02/08/2021  11/28/2020   3  Hydrocodone-Acetamin 10-325 Mg  90.00  30  [place]  [id number]  Car ([id number])  0/0  30.00 MME  Medicaid  [place]      Analgesic: Norco 10 mg TID prn   Monitoring:  PMP: PDMP reviewed during this encounter.       Pharmacotherapy: No side-effects or adverse reactions reported. Compliance: No problems identified. Effectiveness: Clinically acceptable.  Janett Billow, RN  05/27/2021  1:18 PM  Sign when Signing Visit Nursing Pain Medication Assessment:  Safety precautions to be maintained throughout the outpatient stay will include: orient to surroundings, keep bed in low position, maintain call bell within reach at all times, provide assistance with transfer out of bed and ambulation.  Medication Inspection Compliance: Pill count conducted under aseptic conditions, in front of the patient. Neither the pills nor the bottle was removed from the patient's sight at any time. Once count was completed pills were immediately returned to the patient in their original bottle.  Medication: Hydrocodone/APAP Pill/Patch Count:  43 of 90 pills remain Pill/Patch Appearance: Markings consistent with prescribed medication Bottle Appearance: Standard  pharmacy container. Clearly labeled. Filled Date: 07 / 08 / 2022 Last Medication intake:  Today      UDS:  Summary  Date Value Ref Range Status  06/20/2020 Note  Final    Comment:    ==================================================================== Compliance Drug Analysis, Ur ==================================================================== Test                             Result       Flag       Units  Drug Present and Declared for Prescription Verification   Hydrocodone                    2260         EXPECTED   ng/mg creat   Norhydrocodone                 2128         EXPECTED   ng/mg creat    Sources of hydrocodone include scheduled prescription medications.    Norhydrocodone is an expected metabolite of hydrocodone.    Gabapentin                     PRESENT      EXPECTED   Lamotrigine                    PRESENT      EXPECTED   Citalopram                     PRESENT      EXPECTED   Desmethylcitalopram            PRESENT      EXPECTED    Desmethylcitalopram is an expected metabolite of citalopram or the    enantiomeric form, escitalopram.    Aripiprazole                   PRESENT      EXPECTED   Acetaminophen                  PRESENT      EXPECTED  Drug Present not Declared for Prescription Verification   Naproxen                       PRESENT      UNEXPECTED  Drug Absent but Declared for Prescription Verification   Mirtazapine                    Not Detected UNEXPECTED   Propranolol                    Not Detected UNEXPECTED ==================================================================== Test                      Result    Flag   Units      Ref Range   Creatinine              25               mg/dL      >=20 ==================================================================== Declared Medications:  The flagging and interpretation on this report are based on the  following declared medications.  Unexpected results may arise from  inaccuracies in the declared  medications.   **Note: The  testing scope of this panel includes these medications:   Aripiprazole (Abilify)  Citalopram (Celexa)  Gabapentin  Hydrocodone (Norco)  Lamotrigine (Lamictal)  Mirtazapine (Remeron)  Propranolol   **Note: The testing scope of this panel does not include small to  moderate amounts of these reported medications:   Acetaminophen (Tylenol)  Acetaminophen (Norco)   **Note: The testing scope of this panel does not include the  following reported medications:   Alendronate (Fosamax)  Azithromycin  Cetirizine (Zyrtec)  Clindamycin (Cleocin)  Dulaglutide (Trulicity)  Ertugliflozin (Steglatro)  Fluconazole (Diflucan)  Furosemide (Lasix)  Glimepiride  Hydrochlorothiazide  Insulin (Lantus)  Losartan (Cozaar)  Meclizine (Antivert)  Meloxicam (Mobic)  Metformin  Pantoprazole (Protonix)  Spironolactone  Vitamin B  Vitamin C ==================================================================== For clinical consultation, please call [phone]. ====================================================================      ROS  Constitutional: Denies any fever or chills Gastrointestinal: No reported hemesis, hematochezia, vomiting, or acute GI distress Musculoskeletal:  Bilateral knee pain left hip, low back pain Neurological: No reported episodes of acute onset apraxia, aphasia, dysarthria, agnosia, amnesia, paralysis, loss of coordination, or loss of consciousness  Medication Review  ARIPiprazole, B-complex with vitamin C, Dulaglutide, HYDROcodone-acetaminophen, acetaminophen, alendronate, amLODipine, benztropine, bisacodyl, bisoprolol-hydrochlorothiazide, cetirizine, cyclobenzaprine, escitalopram, ferrous sulfate, furosemide, gabapentin, glimepiride, hydrOXYzine, insulin glargine, lamoTRIgine, losartan, meclizine, meloxicam, metFORMIN, pantoprazole, spironolactone, and tiZANidine  History Review  Allergy: Ms. Fesler is allergic to lithium,  penicillins, and sulfa antibiotics. Drug: Ms. Laster  reports previous drug use. Alcohol:  reports previous alcohol use. Tobacco:  reports that she quit smoking about 10 years ago. Her smoking use included cigarettes. She has never used smokeless tobacco. Social: Ms. Foulk  reports that she quit smoking about 10 years ago. Her smoking use included cigarettes. She has never used smokeless tobacco. She reports previous alcohol use. She reports previous drug use. Medical:  has a past medical history of Bipolar 1 disorder ([place]), Diabetes mellitus without complication ([place]), and Hypertension. Surgical: Ms. Adcox  has a past surgical history that includes Abdominal hysterectomy; Tonsillectomy; Cholecystectomy; Esophagogastroduodenoscopy (egd) with propofol (N/A, 06/23/2019); and Colonoscopy with propofol (N/A, 06/23/2019). Family: family history includes Bipolar disorder in her brother, paternal uncle, and paternal uncle; Mental illness in her father.  Laboratory Chemistry Profile   Renal Lab Results  Component Value Date   BUN 11 12/06/2019   CREATININE 0.59 12/06/2019   BCR 19 12/06/2019   GFRAA 114 12/06/2019   GFRNONAA 99 12/06/2019     Hepatic Lab Results  Component Value Date   AST 48 (H) 12/06/2019   ALT 47 (H) 12/06/2019   ALBUMIN 3.7 (L) 12/06/2019   ALKPHOS 122 (H) 12/06/2019     Electrolytes Lab Results  Component Value Date   NA 137 12/06/2019   K 3.9 12/06/2019   CL 101 12/06/2019   CALCIUM 9.7 12/06/2019     Bone No results found for: VD25OH, VD125OH2TOT, ZM6294TM5, YY5035WS5, 25OHVITD1, 25OHVITD2, 25OHVITD3, TESTOFREE, TESTOSTERONE   Inflammation (CRP: Acute Phase) (ESR: Chronic Phase) No results found for: CRP, ESRSEDRATE, LATICACIDVEN     Note: Above Lab results reviewed.  Recent Imaging Review  US Carotid Bilateral CLINICAL DATA:  Carotid atherosclerosis, hypertension, diabetes  EXAM: BILATERAL CAROTID DUPLEX ULTRASOUND  TECHNIQUE: Pearline Cables scale  imaging, color Doppler and duplex ultrasound were performed of bilateral carotid and vertebral arteries in the neck.  COMPARISON:  None.  FINDINGS: Criteria: Quantification of carotid stenosis is based on velocity parameters that correlate the residual internal carotid diameter with NASCET-based stenosis levels, using the diameter of the distal internal  carotid lumen as the denominator for stenosis measurement.  The following velocity measurements were obtained:  RIGHT  ICA: 130/38 cm/sec  CCA: 704/88 cm/sec  SYSTOLIC ICA/CCA RATIO:  1.1  ECA: 226 cm/sec  LEFT  ICA: 175/41 cm/sec  CCA: 891/69 cm/sec  SYSTOLIC ICA/CCA RATIO:  1.5  ECA: 153 cm/sec  RIGHT CAROTID ARTERY: Diffuse mild intimal thickening and minor atherosclerotic change. No hemodynamically significant right ICA stenosis, velocity elevation, turbulent flow. Degree of narrowing less than 50% by ultrasound criteria.  RIGHT VERTEBRAL ARTERY:  Normal antegrade flow  LEFT CAROTID ARTERY: Similar intimal thickening and minor atherosclerotic change. Slight distal ICA velocity elevation as above but no significant grayscale abnormality, luminal narrowing, turbulent flow, or spectral broadening. Degree of stenosis still estimated less than 50%.  LEFT VERTEBRAL ARTERY:  Normal antegrade flow  IMPRESSION: Bilateral carotid atherosclerosis. No hemodynamically significant ICA stenosis. Degree of narrowing less than 50% bilaterally by ultrasound criteria.  Patent antegrade vertebral flow bilaterally  Electronically Signed   By: Jerilynn Mages.  Shick M.D.   On: 12/25/2020 14:44 Note: Reviewed        Physical Exam  General appearance: Well nourished, well developed, and well hydrated. In no apparent acute distress Mental status: Alert, oriented x 3 (person, place, & time)       Respiratory: No evidence of acute respiratory distress Eyes: PERLA Vitals: BP (!) 124/43 (BP Location: Right Arm, Patient Position: Sitting,  Cuff Size: Large)   Pulse 76   Temp (!) 96.6 F (35.9 C) (Temporal)   Resp 16   Ht 5' (1.524 m)   Wt 200 lb (90.7 kg)   SpO2 98%   BMI 39.06 kg/m  BMI: Estimated body mass index is 39.06 kg/m as calculated from the following:   Height as of this encounter: 5' (1.524 m).   Weight as of this encounter: 200 lb (90.7 kg). Ideal: Ideal body weight: 45.5 kg (100 lb 4.9 oz) Adjusted ideal body weight: 63.6 kg (140 lb 3 oz)    Lumbar Exam  Skin & Axial Inspection: No masses, redness, or swelling Alignment: Symmetrical Functional ROM: Pain restricted ROM affecting both sides Stability: No instability detected Muscle Tone/Strength: Functionally intact. No obvious neuro-muscular anomalies detected. Sensory (Neurological): Musculoskeletal pain pattern and dermatomal pain pattern at left L4-L5  Lateral bending test: (+) due to pain.,  Positive straight leg raise test  Gait & Posture Assessment  Ambulation: Patient came in today in a wheel chair Gait: Limited. Using assistive device to ambulate Posture: Antalgic    Lower Extremity Exam      Side: Right lower extremity   Side: Left lower extremity  Stability: No instability observed           Stability: No instability observed          Skin & Extremity Inspection: Skin color, temperature, and hair growth are WNL. No peripheral edema or cyanosis. No masses, redness, swelling, asymmetry, or associated skin lesions. No contractures.   Skin & Extremity Inspection: Skin color, temperature, and hair growth are WNL. No peripheral edema or cyanosis. No masses, redness, swelling, asymmetry, or associated skin lesions. No contractures.  Functional ROM: Pain restricted ROM for knee joint           Functional ROM: Pain restricted ROM for hip and knee joints, positive straight leg raise test          Muscle Tone/Strength: Functionally intact. No obvious neuro-muscular anomalies detected.   Muscle Tone/Strength: Functionally intact. No obvious  neuro-muscular anomalies detected.  Sensory (Neurological): Arthropathic arthralgia knee right foot pain        Sensory (Neurological): Arthropathic arthralgia for the knee   and neurogenic      DTR: Patellar: deferred today Achilles: deferred today Plantar: deferred today   DTR: Patellar: deferred today Achilles: deferred today Plantar: deferred today  Palpation: No palpable anomalies   Palpation: No palpable anomalies       Assessment   Status Diagnosis  Persistent Persistent Persistent 1. Radicular pain of left lower extremity   2. Radicular pain of right lower extremity   3. Chronic radicular lumbar pain   4. Cervical radicular pain   5. Encounter for long-term opiate analgesic use   6. Lumbar facet arthropathy   7. Chronic pain syndrome      Updated Problems: Problem  Chronic Radicular Lumbar Pain    Plan of Care   Ms. Sarabi Sockwell has a current medication list which includes the following long-term medication(s): aripiprazole, benztropine, bisoprolol-hydrochlorothiazide, cetirizine, escitalopram, furosemide, gabapentin, glimepiride, lamotrigine, losartan, metformin, pantoprazole, spironolactone, and ferrous sulfate.  Pharmacotherapy (Medications Ordered): Meds ordered this encounter  Medications   HYDROcodone-acetaminophen (NORCO) 10-325 MG tablet    Sig: Take 1 tablet by mouth 3 (three) times daily as needed. For chronic pain syndrome. To last for 30 days from fill date.    Dispense:  90 tablet    Refill:  0    Ok to fill 1 day early if pharmacy closed on fill date   HYDROcodone-acetaminophen (NORCO) 10-325 MG tablet    Sig: Take 1 tablet by mouth 3 (three) times daily as needed. For chronic pain syndrome. To last for 30 days from fill date.    Dispense:  90 tablet    Refill:  0    Ok to fill 1 day early if pharmacy closed on fill date   HYDROcodone-acetaminophen (NORCO) 10-325 MG tablet    Sig: Take 1 tablet by mouth 3 (three) times daily as needed. For  chronic pain syndrome. To last for 30 days from fill date.    Dispense:  90 tablet    Refill:  0    Ok to fill 1 day early if pharmacy closed on fill date   Orders:  Orders Placed This Encounter  Procedures   Lumbar Epidural Injection    Standing Status:   Future    Standing Expiration Date:   06/27/2021    Scheduling Instructions:     Procedure: Interlaminar Lumbar Epidural Steroid injection (LESI)            Laterality: Midline     Sedation: Patient's choice.     Timeframe: ASAA    Order Specific Question:   Where will this procedure be performed?    Answer:   [facility]   ToxASSURE Select 13 (MW), Urine    Volume: 30 ml(s). Minimum 3 ml of urine is needed. Document temperature of fresh sample. Indications: Long term (current) use of opiate analgesic (O03.559)    Order Specific Question:   Release to patient    Answer:   Immediate   Follow-up plan:   Return in about 1 week (around 06/03/2021) for L4/5 ESI , without sedation.      Interventional management options: Planned, scheduled, and/or pending:    Lumbar epidural steroid injection at L4-L5 without sedation Intra-articular knee Hyalgan series #1 done 08/28/20 helped significantly (100% pain relief for 4 months), repeat as needed   Considering:   Lumbar epidural steroid injection, lumbar facet medial branch nerve  block pending lumbar MRI   PRN Procedures:   Repeat bilateral intra-articular Hyalgan injection        Recent Visits Date Type Provider Dept  04/09/21 Telemedicine Gillis Santa, MD [facility]  03/19/21 Procedure visit Gillis Santa, MD [facility]  Showing recent visits within past 90 days and meeting all other requirements Today's Visits Date Type Provider Dept  05/27/21 Office Visit Gillis Santa, MD [facility]  Showing today's visits and meeting all other requirements Future Appointments No visits were found meeting these conditions. Showing future appointments  within next 90 days and meeting all other requirements I discussed the assessment and treatment plan with the patient. The patient was provided an opportunity to ask questions and all were answered. The patient agreed with the plan and demonstrated an understanding of the instructions.  Patient advised to call back or seek an in-person evaluation if the symptoms or condition worsens.  Duration of encounter: 91mnutes.  Note by: BGillis Santa MD Date: 05/27/2021; Time: 1:55 PM

## 2021-05-27 NOTE — Patient Instructions (Signed)
____________________________________________________________________________________________  General Risks and Possible Complications  Patient Responsibilities: It is important that you read this as it is part of your informed consent. It is our duty to inform you of the risks and possible complications associated with treatments offered to you. It is your responsibility as a patient to read this and to ask questions about anything that is not clear or that you believe was not covered in this document.  Patient's Rights: You have the right to refuse treatment. You also have the right to change your mind, even after initially having agreed to have the treatment done. However, under this last option, if you wait until the last second to change your mind, you may be charged for the materials used up to that point.  Introduction: Medicine is not an exact science. Everything in Medicine, including the lack of treatment(s), carries the potential for danger, harm, or loss (which is by definition: Risk). In Medicine, a complication is a secondary problem, condition, or disease that can aggravate an already existing one. All treatments carry the risk of possible complications. The fact that a side effects or complications occurs, does not imply that the treatment was conducted incorrectly. It must be clearly understood that these can happen even when everything is done following the highest safety standards.  No treatment: You can choose not to proceed with the proposed treatment alternative. The "PRO(s)" would include: avoiding the risk of complications associated with the therapy. The "CON(s)" would include: not getting any of the treatment benefits. These benefits fall under one of three categories: diagnostic; therapeutic; and/or palliative. Diagnostic benefits include: getting information which can ultimately lead to improvement of the disease or symptom(s). Therapeutic benefits are those associated with  the successful treatment of the disease. Finally, palliative benefits are those related to the decrease of the primary symptoms, without necessarily curing the condition (example: decreasing the pain from a flare-up of a chronic condition, such as incurable terminal cancer).  General Risks and Complications: These are associated to most interventional treatments. They can occur alone, or in combination. They fall under one of the following six (6) categories: no benefit or worsening of symptoms; bleeding; infection; nerve damage; allergic reactions; and/or death. No benefits or worsening of symptoms: In Medicine there are no guarantees, only probabilities. No healthcare provider can ever guarantee that a medical treatment will work, they can only state the probability that it may. Furthermore, there is always the possibility that the condition may worsen, either directly, or indirectly, as a consequence of the treatment. Bleeding: This is more common if the patient is taking a blood thinner, either prescription or over the counter (example: Goody Powders, Fish oil, Aspirin, Garlic, etc.), or if suffering a condition associated with impaired coagulation (example: Hemophilia, cirrhosis of the liver, low platelet counts, etc.). However, even if you do not have one on these, it can still happen. If you have any of these conditions, or take one of these drugs, make sure to notify your treating physician. Infection: This is more common in patients with a compromised immune system, either due to disease (example: diabetes, cancer, human immunodeficiency virus [HIV], etc.), or due to medications or treatments (example: therapies used to treat cancer and rheumatological diseases). However, even if you do not have one on these, it can still happen. If you have any of these conditions, or take one of these drugs, make sure to notify your treating physician. Nerve Damage: This is more common when the treatment is an    invasive one, but it can also happen with the use of medications, such as those used in the treatment of cancer. The damage can occur to small secondary nerves, or to large primary ones, such as those in the spinal cord and brain. This damage may be temporary or permanent and it may lead to impairments that can range from temporary numbness to permanent paralysis and/or brain death. Allergic Reactions: Any time a substance or material comes in contact with our body, there is the possibility of an allergic reaction. These can range from a mild skin rash (contact dermatitis) to a severe systemic reaction (anaphylactic reaction), which can result in death. Death: In general, any medical intervention can result in death, most of the time due to an unforeseen complication. ____________________________________________________________________________________________   Epidural Steroid Injection Patient Information  Description: The epidural space surrounds the nerves as they exit the spinal cord.  In some patients, the nerves can be compressed and inflamed by a bulging disc or a tight spinal canal (spinal stenosis).  By injecting steroids into the epidural space, we can bring irritated nerves into direct contact with a potentially helpful medication.  These steroids act directly on the irritated nerves and can reduce swelling and inflammation which often leads to decreased pain.  Epidural steroids may be injected anywhere along the spine and from the neck to the low back depending upon the location of your pain.   After numbing the skin with local anesthetic (like Novocaine), a small needle is passed into the epidural space slowly.  You may experience a sensation of pressure while this is being done.  The entire block usually last less than 10 minutes.  Conditions which may be treated by epidural steroids:  Low back and leg pain Neck and arm pain Spinal stenosis Post-laminectomy syndrome Herpes zoster  (shingles) pain Pain from compression fractures  Preparation for the injection:  Do not eat any solid food or dairy products within 8 hours of your appointment.  You may drink clear liquids up to 3 hours before appointment.  Clear liquids include water, black coffee, juice or soda.  No milk or cream please. You may take your regular medication, including pain medications, with a sip of water before your appointment  Diabetics should hold regular insulin (if taken separately) and take 1/2 normal NPH dos the morning of the procedure.  Carry some sugar containing items with you to your appointment. A driver must accompany you and be prepared to drive you home after your procedure.  Bring all your current medications with your. An IV may be inserted and sedation may be given at the discretion of the physician.   A blood pressure cuff, EKG and other monitors will often be applied during the procedure.  Some patients may need to have extra oxygen administered for a short period. You will be asked to provide medical information, including your allergies, prior to the procedure.  We must know immediately if you are taking blood thinners (like Coumadin/Warfarin)  Or if you are allergic to IV iodine contrast (dye). We must know if you could possible be pregnant.  Possible side-effects: Bleeding from needle site Infection (rare, may require surgery) Nerve injury (rare) Numbness & tingling (temporary) Difficulty urinating (rare, temporary) Spinal headache ( a headache worse with upright posture) Light -headedness (temporary) Pain at injection site (several days) Decreased blood pressure (temporary) Weakness in arm/leg (temporary) Pressure sensation in back/neck (temporary)  Call if you experience: Fever/chills associated with headache or increased back/neck pain.   Headache worsened by an upright position. New onset weakness or numbness of an extremity below the injection site Hives or difficulty  breathing (go to the emergency room) Inflammation or drainage at the infection site Severe back/neck pain Any new symptoms which are concerning to you  Please note:  Although the local anesthetic injected can often make your back or neck feel good for several hours after the injection, the pain will likely return.  It takes 3-7 days for steroids to work in the epidural space.  You may not notice any pain relief for at least that one week.  If effective, we will often do a series of three injections spaced 3-6 weeks apart to maximally decrease your pain.  After the initial series, we generally will wait several months before considering a repeat injection of the same type.  If you have any questions, please call [phone] [facility] 

## 2021-05-29 LAB — TOXASSURE SELECT 13 (MW), URINE

## 2021-06-05 ENCOUNTER — Other Ambulatory Visit: Payer: Self-pay | Admitting: Psychiatry

## 2021-06-05 DIAGNOSIS — F410 Panic disorder [episodic paroxysmal anxiety] without agoraphobia: Secondary | ICD-10-CM

## 2021-06-10 ENCOUNTER — Other Ambulatory Visit: Payer: Self-pay | Admitting: Student in an Organized Health Care Education/Training Program

## 2021-06-10 DIAGNOSIS — M5136 Other intervertebral disc degeneration, lumbar region: Secondary | ICD-10-CM

## 2021-06-10 DIAGNOSIS — M5410 Radiculopathy, site unspecified: Secondary | ICD-10-CM

## 2021-06-10 DIAGNOSIS — G8929 Other chronic pain: Secondary | ICD-10-CM

## 2021-06-10 NOTE — Progress Notes (Signed)
Insurance would not approve lumbar epidural steroid injection based upon physical and clinical exam findings suggestive of lumbar radicular pain flare.  They want a lumbar MRI prior.  Order placed.  Orders Placed This Encounter  Procedures   MR LUMBAR SPINE WO CONTRAST    Patient presents with axial pain with possible radicular component.  In addition to any acute findings, please report on:  1. Facet (Zygapophyseal) joint DJD (Hypertrophy, space narrowing, subchondral sclerosis, and/or osteophyte formation) 2. DDD and/or IVDD (Loss of disc height, desiccation or "Black disc disease") 3. Pars defects 4. Spondylolisthesis, spondylosis, and/or spondyloarthropathies (include Degree/Grade of displacement in mm) 5. Vertebral body Fractures, including age (old, new/acute) 37. Modic Type Changes 7. Demineralization 8. Bone pathology 9. Central, Lateral Recess, and/or Foraminal Stenosis (include AP diameter of stenosis in mm) 10. Surgical changes (hardware type, status, and presence of fibrosis)  NOTE: Please specify level(s) and laterality.    Standing Status:   Future    Standing Expiration Date:   07/11/2021    Scheduling Instructions:     Imaging must be done as soon as possible. Inform patient that order will expire within 30 days and I will not renew it.    Order Specific Question:   What is the patient's sedation requirement?    Answer:   No Sedation    Order Specific Question:   Does the patient have a pacemaker or implanted devices?    Answer:   No    Order Specific Question:   Preferred imaging location?    Answer:   [facility] (table limit-350lbs)    Order Specific Question:   Call Results- Best Contact Number?    Answer:   [phone] ([facility])    Order Specific Question:   Radiology Contrast Protocol - do NOT remove file path    Answer:   [email or web address]

## 2021-06-25 ENCOUNTER — Ambulatory Visit: Admission: RE | Admit: 2021-06-25 | Payer: Medicaid Other | Source: Ambulatory Visit

## 2021-07-01 ENCOUNTER — Ambulatory Visit: Payer: Medicaid Other | Admitting: Psychiatry

## 2021-07-01 ENCOUNTER — Other Ambulatory Visit: Payer: Self-pay

## 2021-07-01 ENCOUNTER — Encounter: Payer: Self-pay | Admitting: Psychiatry

## 2021-07-01 VITALS — BP 138/67 | HR 68 | Temp 99.4°F | Ht 61.0 in | Wt 199.4 lb

## 2021-07-01 DIAGNOSIS — R251 Tremor, unspecified: Secondary | ICD-10-CM

## 2021-07-01 DIAGNOSIS — F410 Panic disorder [episodic paroxysmal anxiety] without agoraphobia: Secondary | ICD-10-CM | POA: Diagnosis not present

## 2021-07-01 DIAGNOSIS — F3176 Bipolar disorder, in full remission, most recent episode depressed: Secondary | ICD-10-CM | POA: Diagnosis not present

## 2021-07-01 MED ORDER — ARIPIPRAZOLE 15 MG PO TABS: 7.5000 mg | ORAL_TABLET | Freq: Every day | ORAL | 0 refills | Status: DC

## 2021-07-01 MED ORDER — LAMOTRIGINE 200 MG PO TABS: 200.0000 mg | ORAL_TABLET | Freq: Every day | ORAL | 0 refills | Status: DC

## 2021-07-01 NOTE — Progress Notes (Signed)
[facility] MD OP Progress Note  07/01/2021 2:41 PM Margaret [name]  MRN:  [id number]  Chief Complaint:  Chief Complaint   Follow-up; Anxiety    HPI: Margaret [name] is a 65 year old Caucasian female on disability, divorced, lives in [place], has a history of bipolar disorder, hyperlipidemia, bereavement, panic attacks, chronic pain was evaluated in the office today.  Patient today reports she is currently doing well with regards to her mood.  Since being on the current combination of medication she has not had any significant mood swings.  Her panic attacks are stable.  She denies side effects to medications.  She reports her tremors have improved on the benztropine.  She continues to follow-up with her pain provider for her pain, currently looks forward to getting injections to her hip and her back.  Patient denies any suicidality, homicidality or perceptual disturbances.  Patient denies any other concerns today.  Visit Diagnosis:    ICD-10-CM   1. Bipolar disorder, in full remission, most recent episode depressed (HCC)  F31.76 lamoTRIgine (LAMICTAL) 200 MG tablet    ARIPiprazole (ABILIFY) 15 MG tablet    2. Panic attacks  F41.0 lamoTRIgine (LAMICTAL) 200 MG tablet    3. Tremor  R25.1       Past Psychiatric History: Reviewed past psychiatric history from progress note on 08/21/2019.  Past trials of Depakote, lithium, risperidone, and Xanax.  Past Medical History:  Past Medical History:  Diagnosis Date   Bipolar 1 disorder ([place])    Diabetes mellitus without complication ([place])    Type 2   Hypertension     Past Surgical History:  Procedure Laterality Date   ABDOMINAL HYSTERECTOMY     CHOLECYSTECTOMY     COLONOSCOPY WITH PROPOFOL N/A 06/23/2019   Procedure: COLONOSCOPY WITH PROPOFOL;  Surgeon: Jonathon Bellows, MD;  Location: [facility] ENDOSCOPY;  Service: Gastroenterology;  Laterality: N/A;   ESOPHAGOGASTRODUODENOSCOPY (EGD) WITH PROPOFOL N/A 06/23/2019   Procedure:  ESOPHAGOGASTRODUODENOSCOPY (EGD) WITH PROPOFOL;  Surgeon: Jonathon Bellows, MD;  Location: [facility] ENDOSCOPY;  Service: Gastroenterology;  Laterality: N/A;   TONSILLECTOMY      Family Psychiatric History: Reviewed family psychiatric history from progress note on 08/21/2019.  Family History:  Family History  Problem Relation Age of Onset   Mental illness Father    Bipolar disorder Brother    Bipolar disorder Paternal Uncle    Bipolar disorder Paternal Uncle     Social History: Reviewed social history from progress note on 08/21/2019. Social History   Socioeconomic History   Marital status: Divorced    Spouse name: Not on file   Number of children: 2   Years of education: Not on file   Highest education level: Not on file  Occupational History   Not on file  Tobacco Use   Smoking status: Former    Types: Cigarettes    Quit date: 03/03/2011    Years since quitting: 10.3   Smokeless tobacco: Never  Vaping Use   Vaping Use: Never used  Substance and Sexual Activity   Alcohol use: Not Currently   Drug use: Not Currently   Sexual activity: Not Currently  Other Topics Concern   Not on file  Social History Narrative   Not on file   Social Determinants of Health   Financial Resource Strain: Not on file  Food Insecurity: Not on file  Transportation Needs: Not on file  Physical Activity: Not on file  Stress: Not on file  Social Connections: Not on file    Allergies:  Allergies  Allergen Reactions   Lithium Nausea And Vomiting and Other (See Comments)   Penicillins Hives    Resulted in hospitalization   Sulfa Antibiotics Hives    Resulted in hospitalization    Metabolic Disorder Labs: Lab Results  Component Value Date   HGBA1C 7.3 (H) 06/22/2019   MPG 162.81 06/22/2019   No results found for: PROLACTIN No results found for: CHOL, TRIG, HDL, CHOLHDL, VLDL, LDLCALC Lab Results  Component Value Date   TSH 1.737 11/16/2019    Therapeutic Level Labs: No results found  for: LITHIUM No results found for: VALPROATE No components found for:  CBMZ  Current Medications: Current Outpatient Medications  Medication Sig Dispense Refill   acetaminophen (TYLENOL) 500 MG tablet Take 500 mg by mouth every 6 (six) hours as needed.      alendronate (FOSAMAX) 35 MG tablet Take 35 mg by mouth every 7 (seven) days. Take with a full glass of water on an empty stomach.     amLODipine (NORVASC) 5 MG tablet Take 2 mg by mouth daily.     amLODipine (NORVASC) 5 MG tablet Take by mouth. (Patient not taking: No sig reported)     ARIPiprazole (ABILIFY) 15 MG tablet Take 0.5 tablets (7.5 mg total) by mouth daily. 45 tablet 0   B Complex-C (B-COMPLEX WITH VITAMIN C) tablet Take 1 tablet by mouth daily. 30 tablet 2   benztropine (COGENTIN) 0.5 MG tablet Take 1 tablet (0.5 mg total) by mouth daily as needed for tremors. 15 tablet 1   bisacodyl (DULCOLAX) 5 MG EC tablet Take 5 mg by mouth daily as needed for moderate constipation.     bisoprolol-hydrochlorothiazide (ZIAC) 5-6.25 MG tablet Take 1 tablet by mouth daily.     cetirizine (ZYRTEC) 10 MG tablet Take 10 mg by mouth daily.     cyclobenzaprine (FLEXERIL) 10 MG tablet TAKE 1 TABLET BY MOUTHATHREE TIMES A DAY AS NEEDED.     Dulaglutide (TRULICITY) [id number] 0000000 SOPN Inject 0.75 mg into the skin once a week. Thursday     escitalopram (LEXAPRO) 10 MG tablet TAKE 1 1/2 TABLET BY MOUTH DAILY. 135 tablet 0   ferrous sulfate 325 (65 FE) MG tablet Take 1 tablet (325 mg total) by mouth 2 (two) times daily with a meal. (Patient not taking: Reported on 05/27/2021) 120 tablet 0   furosemide (LASIX) 20 MG tablet Take 10 mg by mouth daily as needed.     gabapentin (NEURONTIN) 600 MG tablet Take 1 tablet (600 mg total) by mouth daily. 270 tablet 0   glimepiride (AMARYL) 2 MG tablet Take 2 mg by mouth daily with breakfast.      HYDROcodone-acetaminophen (NORCO) 10-325 MG tablet Take 1 tablet by mouth 3 (three) times daily as needed. For chronic pain  syndrome. To last for 30 days from fill date. 90 tablet 0   [START ON 07/08/2021] HYDROcodone-acetaminophen (NORCO) 10-325 MG tablet Take 1 tablet by mouth 3 (three) times daily as needed. For chronic pain syndrome. To last for 30 days from fill date. 90 tablet 0   [START ON 08/07/2021] HYDROcodone-acetaminophen (NORCO) 10-325 MG tablet Take 1 tablet by mouth 3 (three) times daily as needed. For chronic pain syndrome. To last for 30 days from fill date. 90 tablet 0   hydrOXYzine (VISTARIL) 25 MG capsule TAKE 1 OR 2 CAPSULES BY MOUTH ONCE DAILY AS NEEDED. TAKE ONLY FOR SEVERE PANIC ATTACKS. 60 capsule 0   insulin glargine (LANTUS) 100 UNIT/ML Solostar Pen Inject into the skin.  lamoTRIgine (LAMICTAL) 200 MG tablet Take 1 tablet (200 mg total) by mouth daily. 90 tablet 0   losartan (COZAAR) 25 MG tablet Take 25 mg by mouth daily. Takes differently     meclizine (ANTIVERT) 25 MG tablet Take 25 mg by mouth 2 (two) times daily as needed.     meloxicam (MOBIC) 7.5 MG tablet TAKE 1 TABLET BY MOUTH EVERY DAY     metFORMIN (GLUCOPHAGE) 500 MG tablet Take 500 mg by mouth 2 (two) times daily with a meal.     pantoprazole (PROTONIX) 40 MG tablet Take 40 mg by mouth daily.     spironolactone (ALDACTONE) 25 MG tablet Take 25 mg by mouth daily.     tiZANidine (ZANAFLEX) 2 MG tablet Take by mouth. (Patient not taking: Reported on 05/27/2021)     No current facility-administered medications for this visit.     Musculoskeletal: Strength & Muscle Tone:  UTA Gait & Station:  Wheel chair Patient leans: Backward  Psychiatric Specialty Exam: Review of Systems  Musculoskeletal:  Positive for arthralgias.  All other systems reviewed and are negative.  Blood pressure 138/67, pulse 68, temperature 99.4 F (37.4 C), height '5\' 1"'$  (1.549 m), weight 199 lb 6.4 oz (90.4 kg), SpO2 98 %.Body mass index is 37.68 kg/m.  General Appearance: Casual  Eye Contact:  Good  Speech:  Clear and Coherent  Volume:  Normal  Mood:   Euthymic  Affect:  Congruent  Thought Process:  Goal Directed and Descriptions of Associations: Intact  Orientation:  Full (Time, Place, and Person)  Thought Content: Logical   Suicidal Thoughts:  No  Homicidal Thoughts:  No  Memory:  Immediate;   Fair Recent;   Fair Remote;   Fair  Judgement:  Fair  Insight:  Fair  Psychomotor Activity:  Normal  Concentration:  Concentration: Fair and Attention Span: Fair  Recall:  [employer] of Knowledge: Fair  Language: Fair  Akathisia:  No  Handed:  Right  AIMS (if indicated): done  Assets:  Communication Skills Desire for Improvement Housing Social Support  ADL's:  Intact  Cognition: WNL  Sleep:  Fair   Screenings: [place] Office Visit from 03/04/2021 in [place] Total Score 0      PHQ2-9    [place] Visit from 03/04/2021 in [place] Video Visit from 12/23/2020 in [place] Counselor from 12/09/2020 in [place] Procedure visit from 09/18/2020 in [place] Procedure visit from 08/28/2020 in [place]  PHQ-2 Total Score 1 0 5 0 0  PHQ-9 Total Score -- -- 15 -- --      [place] Office Visit from 03/04/2021 in [place] Video Visit from 12/23/2020 in [place] Counselor from 12/09/2020 in [place] No Risk No Risk No Risk        Assessment and Plan: Margaret [name] is a 65 year old Caucasian female on disability, divorced, lives in [place], has a history of bipolar disorder, chronic pain, multiple medical problems was evaluated in office today.  Patient is currently stable however does struggle with pain, currently under the care of pain provider.  Discussed plan as noted  below.  Plan Bipolar disorder in remission Lamotrigine 200 mg p.o. daily Abilify 7.5 mg p.o. daily Gabapentin 600 mg p.o. daily AIMS - 0  Panic  attacks-stable Lexapro 15 mg p.o. daily Hydroxyzine 25-50 mg p.o. daily as needed for severe panic attacks only  Tremors likely induced by psychotropics-improving Benztropine 0.5 mg as needed. We will consider neurology referral as needed  Patient to continue follow-up with pain management, will need sufficient pain management.  Patient also advised to do calorie counting, watch her diet, stay active to manage her hemoglobin A1c.  She will continue to follow-up with her providers and has chronic care management nurse who checks on her.  Follow-up in clinic in 2 to 3 months or sooner if needed.    This note was generated in part or whole with voice recognition software. Voice recognition is usually quite accurate but there are transcription errors that can and very often do occur. I apologize for any typographical errors that were not detected and corrected.       Ursula Alert, MD 07/02/2021, 11:24 AM

## 2021-07-04 ENCOUNTER — Other Ambulatory Visit: Payer: Self-pay

## 2021-07-04 ENCOUNTER — Ambulatory Visit
Admission: RE | Admit: 2021-07-04 | Discharge: 2021-07-04 | Disposition: A | Discharge status: Home / Self Care | Payer: Medicaid Other | Source: Ambulatory Visit | Attending: Student in an Organized Health Care Education/Training Program | Admitting: Student in an Organized Health Care Education/Training Program

## 2021-07-04 DIAGNOSIS — M5136 Other intervertebral disc degeneration, lumbar region: Secondary | ICD-10-CM | POA: Diagnosis not present

## 2021-07-04 IMAGING — MR MR LUMBAR SPINE W/O CM
5 series · 31 of 48 positions shown · non-contrast
Comparison: None.

CLINICAL DATA: Osteoarthritis, lumbosacral Low back pain, > 6 wks
Lumbar radiculopathy, > 6 wks.

EXAM:
MRI LUMBAR SPINE WITHOUT CONTRAST
TECHNIQUE: Multiplanar, multisequence MR imaging of the lumbar spine was
performed. No intravenous contrast was administered.

[Series 5: T2 · sagittal · 4.0mm · 0.81mm/px · 6 of 17 slices shown (1 of 2)]
[im 1/17]
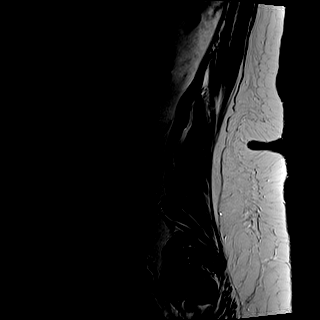
[im 4/17]
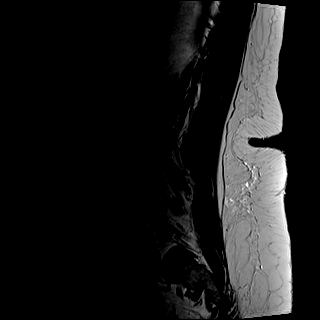
[im 7/17]
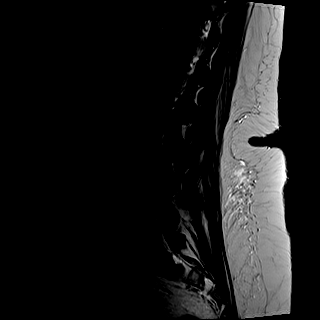
[im 10/17]
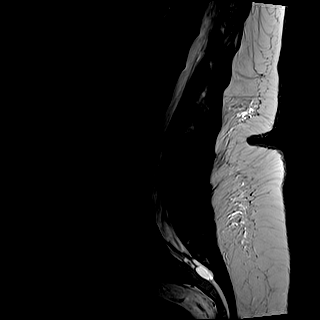
[im 13/17]
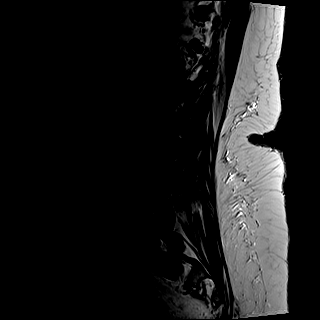
[im 17/17]
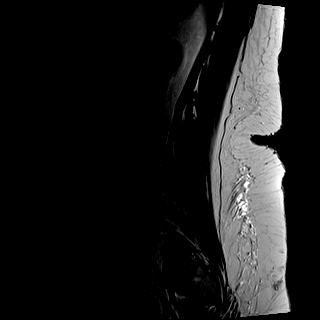

[Series 6: T1 · sagittal · 4.0mm · 0.81mm/px · 7 of 17 slices shown (1 of 2)]
[im 1/17]
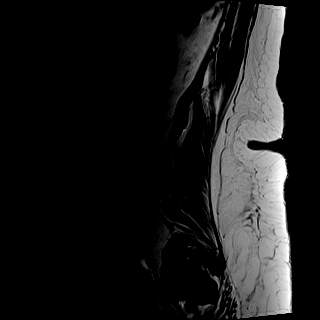
[im 3/17]
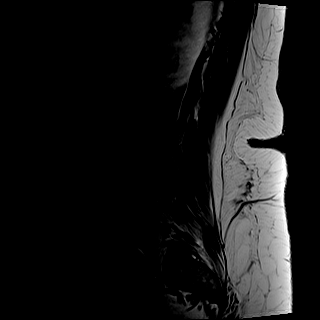
[im 6/17]
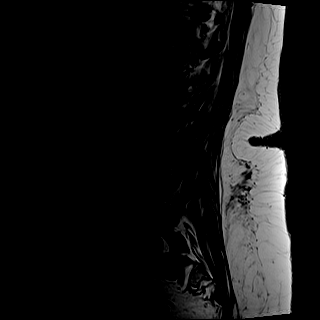
[im 9/17]
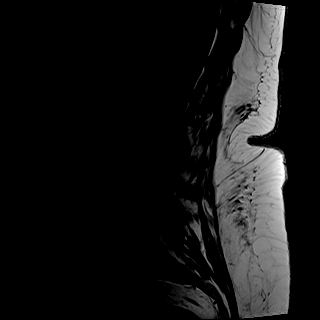
[im 11/17]
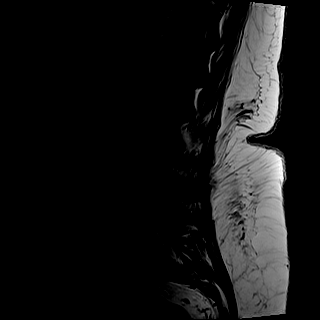
[im 14/17]
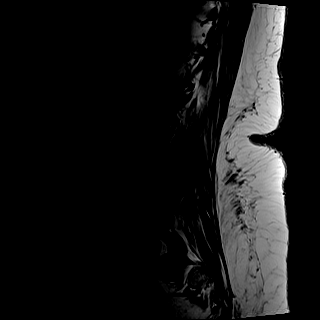
[im 17/17]
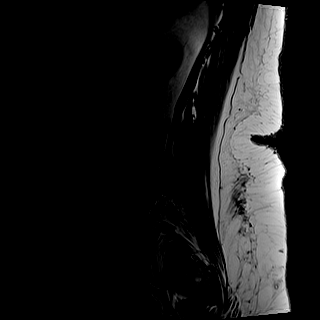

[Series 7: STIR · sagittal · 4.0mm · 0.41mm/px · 2 of 17 slices shown]
[im 1/17]
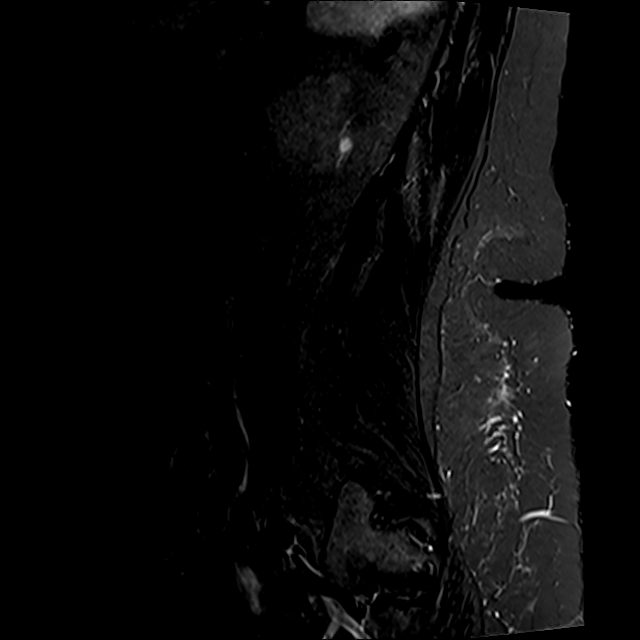
[im 3/17]
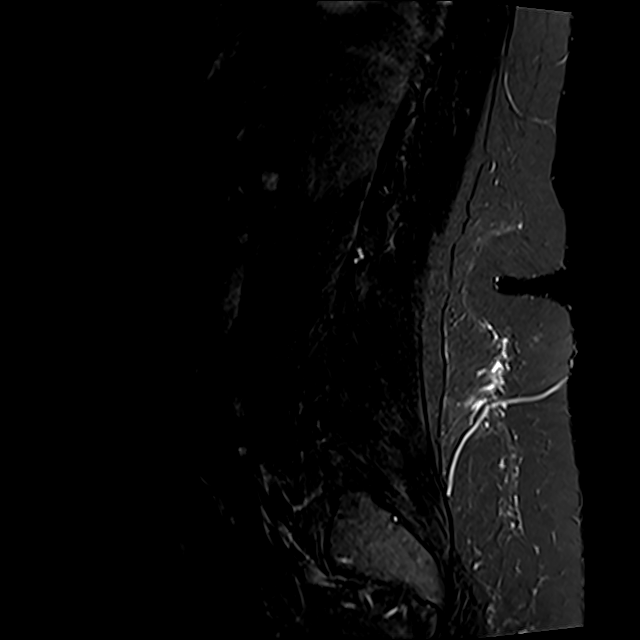

[Series 8: T2 · axial · 4.0mm · 0.78mm/px · z∈[-75,+130]mm · 8 of 34 slices shown (2 of 2)]
[im 1/34]
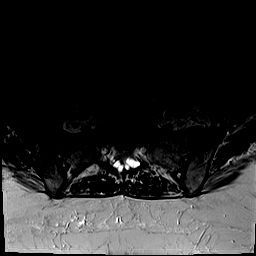
[im 6/34]
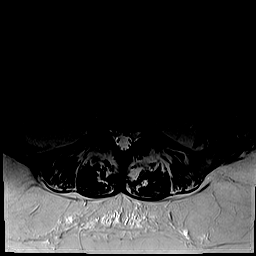
[im 11/34]
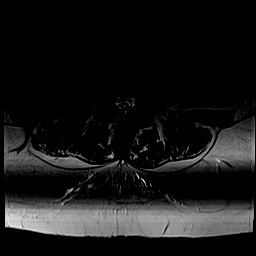
[im 16/34]
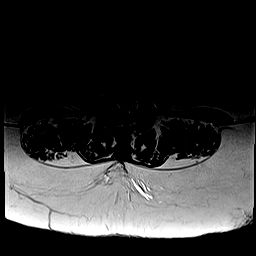
[im 18/34]
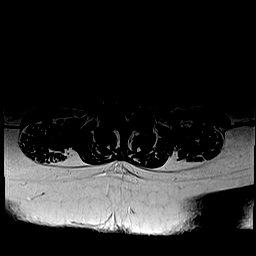
[im 23/34]
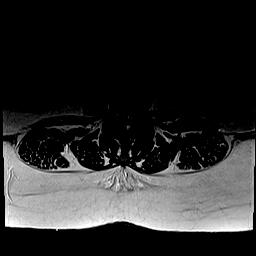
[im 28/34]
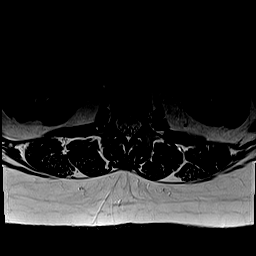
[im 34/34]
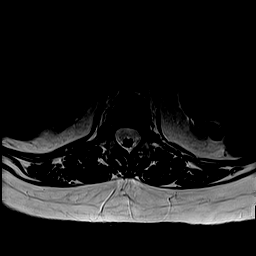

[Series 9: T1 · axial · 4.0mm · 0.39mm/px · z∈[-75,+130]mm · 8 of 34 slices shown (2 of 2)]
[im 1/34]
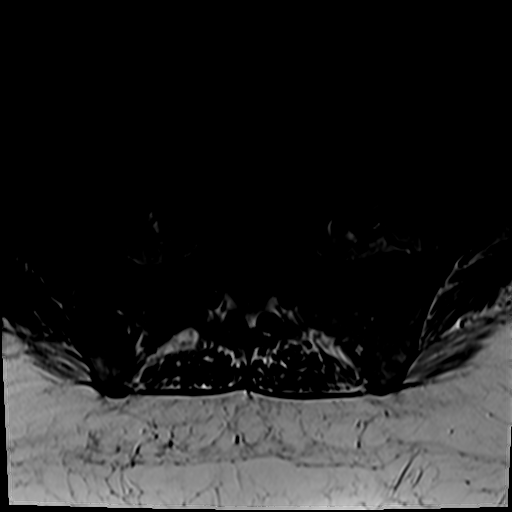
[im 6/34]
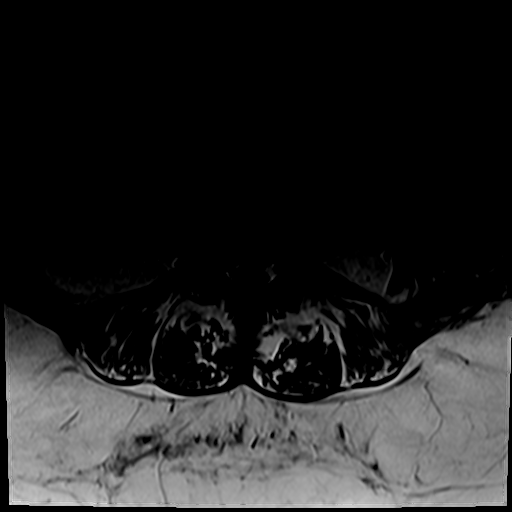
[im 11/34]
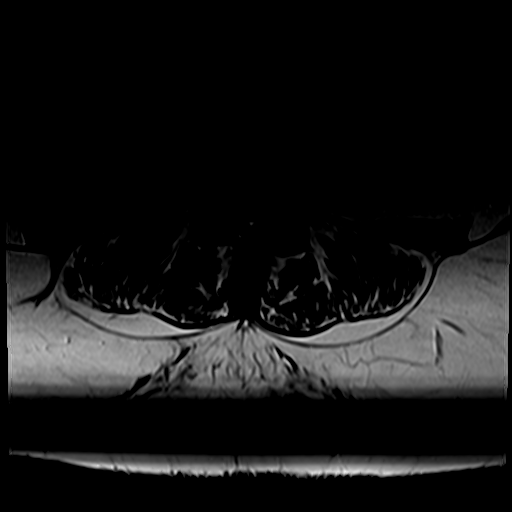
[im 16/34]
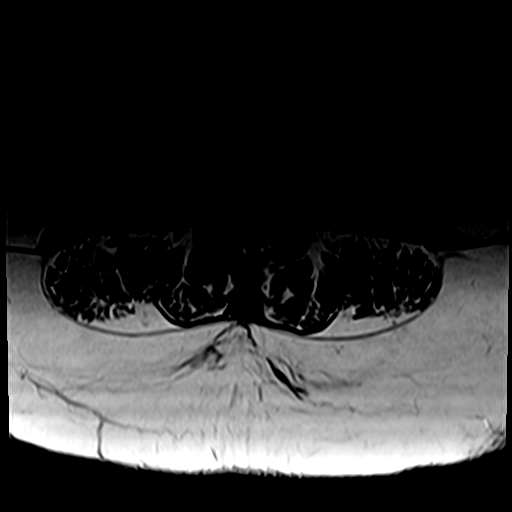
[im 18/34]
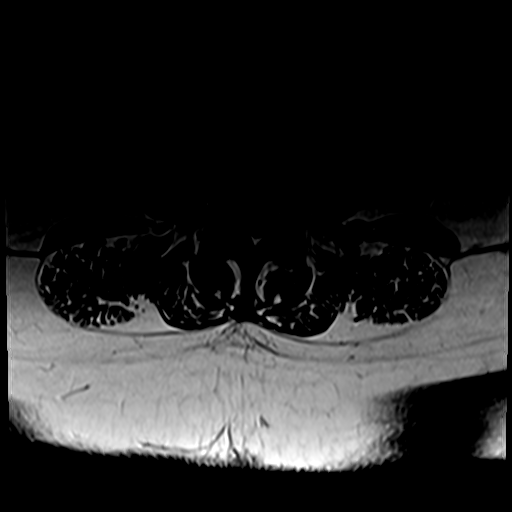
[im 23/34]
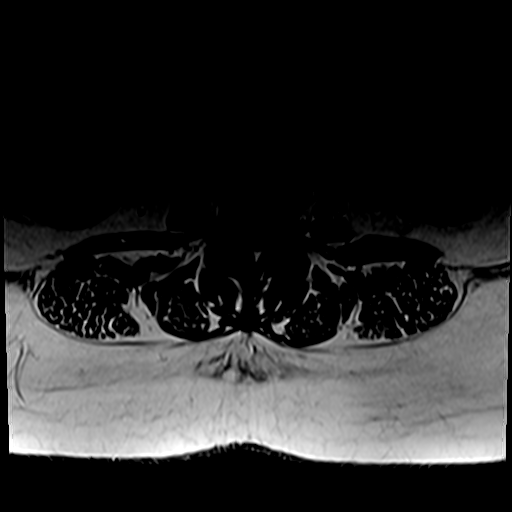
[im 28/34]
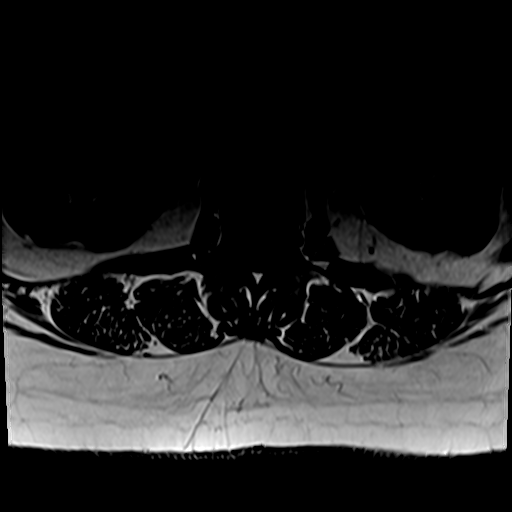
[im 34/34]
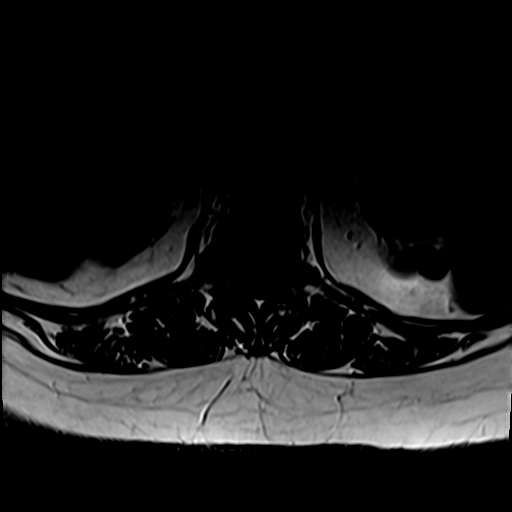

[31 of 48 positions shown; findings below may reference images not displayed]

FINDINGS: Segmentation:  Standard.

Alignment:  Physiologic.

Vertebrae:  No fracture, evidence of discitis, or bone lesion.

Conus medullaris and cauda equina: Conus extends to the L1 level.
Conus and cauda equina appear normal.

Paraspinal and other soft tissues: Negative.

Disc levels:

L1-L2: Normal disc space and facet joints. No spinal canal stenosis.
No neural foraminal stenosis.

L2-L3: Small disc bulge. No spinal canal stenosis. No neural
foraminal stenosis.

L3-L4: Small disc bulge. No spinal canal stenosis. No neural
foraminal stenosis.

L4-L5: Disc desiccation with minimal bulge. No spinal canal
stenosis. No neural foraminal stenosis.

L5-S1: Normal disc space and facet joints. No spinal canal stenosis.
No neural foraminal stenosis.

Visualized sacrum: Normal.
IMPRESSION: Minimal lumbar degenerative disc disease without spinal canal or
neural foraminal stenosis.

## 2021-07-08 ENCOUNTER — Other Ambulatory Visit: Payer: Self-pay | Admitting: Psychiatry

## 2021-07-08 DIAGNOSIS — Z634 Disappearance and death of family member: Secondary | ICD-10-CM

## 2021-07-18 ENCOUNTER — Telehealth: Payer: Self-pay | Admitting: Student in an Organized Health Care Education/Training Program

## 2021-07-18 NOTE — Telephone Encounter (Signed)
Patient lvmail at 1:57 on 07-17-21 stating she received letter from [facility] saying her meds were denied and [employer] needs more documentation. Please check and advise patient the status of PA.

## 2021-07-18 NOTE — Telephone Encounter (Signed)
PA has been  resent with notes

## 2021-07-21 ENCOUNTER — Telehealth: Payer: Self-pay | Admitting: Student in an Organized Health Care Education/Training Program

## 2021-07-21 NOTE — Telephone Encounter (Signed)
Patient received a letter from her insurance [facility] stating she needs authorization before she will receive any further refills. They are canceling her meds as of 07-29-21. They are asking for documentation to back up the chronic pain diagnosis. Please contact patient for any further information needed. Thank you

## 2021-07-21 NOTE — Telephone Encounter (Signed)
Resending PA that was sent on 07/10/21 with additional records.

## 2021-09-02 ENCOUNTER — Ambulatory Visit
Payer: Medicaid Other | Attending: Student in an Organized Health Care Education/Training Program | Admitting: Student in an Organized Health Care Education/Training Program

## 2021-09-02 ENCOUNTER — Other Ambulatory Visit: Payer: Self-pay

## 2021-09-02 ENCOUNTER — Encounter: Payer: Self-pay | Admitting: Student in an Organized Health Care Education/Training Program

## 2021-09-02 VITALS — BP 125/42 | HR 65 | Temp 97.0°F | Resp 16 | Ht 61.0 in | Wt 200.0 lb

## 2021-09-02 DIAGNOSIS — M47816 Spondylosis without myelopathy or radiculopathy, lumbar region: Secondary | ICD-10-CM | POA: Diagnosis present

## 2021-09-02 DIAGNOSIS — M542 Cervicalgia: Secondary | ICD-10-CM | POA: Diagnosis present

## 2021-09-02 DIAGNOSIS — M25511 Pain in right shoulder: Secondary | ICD-10-CM | POA: Diagnosis present

## 2021-09-02 DIAGNOSIS — M7062 Trochanteric bursitis, left hip: Secondary | ICD-10-CM | POA: Diagnosis present

## 2021-09-02 DIAGNOSIS — Z79891 Long term (current) use of opiate analgesic: Secondary | ICD-10-CM | POA: Diagnosis present

## 2021-09-02 DIAGNOSIS — G894 Chronic pain syndrome: Secondary | ICD-10-CM | POA: Diagnosis present

## 2021-09-02 DIAGNOSIS — M170 Bilateral primary osteoarthritis of knee: Secondary | ICD-10-CM | POA: Insufficient documentation

## 2021-09-02 DIAGNOSIS — Z96642 Presence of left artificial hip joint: Secondary | ICD-10-CM | POA: Insufficient documentation

## 2021-09-02 DIAGNOSIS — M5412 Radiculopathy, cervical region: Secondary | ICD-10-CM | POA: Diagnosis present

## 2021-09-02 MED ORDER — HYDROCODONE-ACETAMINOPHEN 10-325 MG PO TABS: 1.0000 | ORAL_TABLET | Freq: Three times a day (TID) | ORAL | 0 refills | Status: DC | PRN

## 2021-09-02 MED ORDER — HYDROCODONE-ACETAMINOPHEN 10-325 MG PO TABS: 1.0000 | ORAL_TABLET | Freq: Three times a day (TID) | ORAL | 0 refills | Status: AC | PRN

## 2021-09-02 NOTE — Patient Instructions (Signed)

## 2021-09-02 NOTE — Progress Notes (Signed)
Safety precautions to be maintained throughout the outpatient stay will include: orient to surroundings, keep bed in low position, maintain call bell within reach at all times, provide assistance with transfer out of bed and ambulation.   Nursing Pain Medication Assessment:  Safety precautions to be maintained throughout the outpatient stay will include: orient to surroundings, keep bed in low position, maintain call bell within reach at all times, provide assistance with transfer out of bed and ambulation.  Medication Inspection Compliance: Pill count conducted under aseptic conditions, in front of the patient. Neither the pills nor the bottle was removed from the patient's sight at any time. Once count was completed pills were immediately returned to the patient in their original bottle.  Medication: Hydrocodone/APAP Pill/Patch Count:  14.5 of 90 pills remain Pill/Patch Appearance: Markings consistent with prescribed medication Bottle Appearance: Standard pharmacy container. Clearly labeled. Filled Date: 37 / 06 / 2022 Last Medication intake:  Today

## 2021-09-02 NOTE — Progress Notes (Signed)
PROVIDER NOTE: Information contained herein reflects review and annotations entered in association with encounter. Interpretation of such information and data should be left to medically-trained personnel. Information provided to patient can be located elsewhere in the medical record under "Patient Instructions". Document created using STT-dictation technology, any transcriptional errors that may result from process are unintentional.    Patient: Margaret [name]  Service Category: E/M  Provider: Gillis Santa, MD  DOB: 1955/11/03  DOS: 09/02/2021  Specialty: Interventional Pain Management  MRN: [id number]  Setting: Ambulatory outpatient  PCP: Rusty Aus, MD  Type: Established Patient    Referring Provider: Rusty Aus, MD  Location: Office  Delivery: Face-to-face     HPI  Ms. Keylee Shrestha, a 65 y.o. year old female, is here today because of her History of left hip replacement [Z96.642]. Ms. Thien primary complain today is Medication Refill and Leg Pain Last encounter: My last encounter with her was on 05/27/21 Pertinent problems: Ms. Menn has Chronic pain syndrome; Encounter for long-term opiate analgesic use; Bilateral primary osteoarthritis of knee; Sacroiliac joint pain; History of left hip replacement; Recurrent major depressive episodes, moderate ([place]); Chronic radicular lumbar pain; Pain management contract signed; Lumbar facet arthropathy; Pain in joint of right shoulder; and Cervical facet joint syndrome on their pertinent problem list. Pain Assessment: Severity of Chronic pain is reported as a 6 /10. Location: Hip Right, Left/Radaites from hips bilateral into top of legs into the knees. Right hip pain is new is worsening.. Onset: More than a month ago. Quality: Throbbing, Aching. Timing: Intermittent. Modifying factor(s): Pain medication, ibuprofen. Vitals:  height is '5\' 1"'  (1.549 m) and weight is 200 lb (90.7 kg). Her temporal temperature is 97 F (36.1 C) (abnormal). Her blood pressure  is 125/42 (abnormal) and her pulse is 65. Her respiration is 16 and oxygen saturation is 96%.   Reason for encounter: medication management.   Patient follows up today for medication management as well as increased left hip pain.  She has a history of left hip replacement.  She saw Dr. Rudene Christians with orthopedic surgery [facility] who explained to her that she has a very complicated issue with her left hip and that removal of hardware would be very challenging.  Patient has also completed her lumbar MRI as below.  Patient's lumbar MRI is largely unremarkable.  There is no radicular cause to her radiating left hip pain.  She is tender to palpation along her left greater trochanteric bursa.  She follows up today for medication management and requesting a left hip injection.   Pharmacotherapy Assessment   02/08/2021  11/28/2020   3  Hydrocodone-Acetamin 10-325 Mg  90.00  30  Bi Lat  [id number]  Car ([id number])  0/0  30.00 MME  Medicaid  [place]      Analgesic: Norco 10 mg TID prn   Monitoring: [place] PMP: PDMP reviewed during this encounter.       Pharmacotherapy: No side-effects or adverse reactions reported. Compliance: No problems identified. Effectiveness: Clinically acceptable.  Al Decant, RN  09/02/2021  9:09 AM  Sign when Signing Visit Safety precautions to be maintained throughout the outpatient stay will include: orient to surroundings, keep bed in low position, maintain call bell within reach at all times, provide assistance with transfer out of bed and ambulation.   Nursing Pain Medication Assessment:  Safety precautions to be maintained throughout the outpatient stay will include: orient to surroundings, keep bed in low position, maintain call bell within reach at all times, provide  assistance with transfer out of bed and ambulation.  Medication Inspection Compliance: Pill count conducted under aseptic conditions, in front of the patient. Neither the pills nor the bottle was removed from  the patient's sight at any time. Once count was completed pills were immediately returned to the patient in their original bottle.  Medication: Hydrocodone/APAP Pill/Patch Count:  14.5 of 90 pills remain Pill/Patch Appearance: Markings consistent with prescribed medication Bottle Appearance: Standard pharmacy container. Clearly labeled. Filled Date: 43 / 06 / 2022 Last Medication intake:  Today         UDS:  Summary  Date Value Ref Range Status  05/27/2021 Note  Final    Comment:    ==================================================================== [employer] [facility] 13 (MW) ==================================================================== Test                             Result       Flag       Units  Drug Present and Declared for Prescription Verification   Hydrocodone                    3150         EXPECTED   ng/mg creat   Dihydrocodeine                 486          EXPECTED   ng/mg creat   Norhydrocodone                 2791         EXPECTED   ng/mg creat    Sources of hydrocodone include scheduled prescription medications.    Dihydrocodeine and norhydrocodone are expected metabolites of    hydrocodone. Dihydrocodeine is also available as a scheduled    prescription medication.  ==================================================================== Test                      Result    Flag   Units      Ref Range   Creatinine              22               mg/dL      >=20 ==================================================================== Declared Medications:  The flagging and interpretation on this report are based on the  following declared medications.  Unexpected results may arise from  inaccuracies in the declared medications.   **Note: The testing scope of this panel includes these medications:   Hydrocodone (Norco)   **Note: The testing scope of this panel does not include the  following reported medications:   Acetaminophen (Tylenol)  Acetaminophen  (Norco)  Alendronate (Fosamax)  Amlodipine (Norvasc)  Aripiprazole (Abilify)  Benztropine (Cogentin)  Bisacodyl  Bisoprolol  Cetirizine (Zyrtec)  Cyclobenzaprine (Flexeril)  Dulaglutide (Trulicity)  Escitalopram (Lexapro)  Furosemide (Lasix)  Gabapentin (Neurontin)  Glimepiride (Amaryl)  Hydrochlorothiazide  Hydroxyzine (Vistaril)  Insulin (Lantus)  Iron  Lamotrigine (Lamictal)  Losartan (Cozaar)  Meclizine (Antivert)  Meloxicam (Mobic)  Metformin (Glucophage)  Pantoprazole (Protonix)  Spironolactone (Aldactone)  Tizanidine (Zanaflex)  Vitamin B  Vitamin C ==================================================================== For clinical consultation, please call [phone]. ====================================================================      ROS  Constitutional: Denies any fever or chills Gastrointestinal: No reported hemesis, hematochezia, vomiting, or acute GI distress Musculoskeletal:  Bilateral knee pain left hip, low back pain Neurological: No reported episodes of acute onset apraxia, aphasia, dysarthria, agnosia, amnesia, paralysis, loss of  coordination, or loss of consciousness  Medication Review  ARIPiprazole, B-complex with vitamin C, Dulaglutide, HYDROcodone-acetaminophen, acetaminophen, alendronate, amLODipine, benztropine, bisacodyl, bisoprolol-hydrochlorothiazide, cetirizine, cyclobenzaprine, escitalopram, ferrous sulfate, furosemide, gabapentin, glimepiride, hydrOXYzine, insulin glargine, lamoTRIgine, losartan, meclizine, meloxicam, metFORMIN, pantoprazole, spironolactone, and tiZANidine  History Review  Allergy: Ms. Douds is allergic to lithium, penicillins, and sulfa antibiotics. Drug: Ms. Viele  reports that she does not currently use drugs. Alcohol:  reports that she does not currently use alcohol. Tobacco:  reports that she quit smoking about 10 years ago. Her smoking use included cigarettes. She has never used smokeless tobacco. Social:  Ms. Arca  reports that she quit smoking about 10 years ago. Her smoking use included cigarettes. She has never used smokeless tobacco. She reports that she does not currently use alcohol. She reports that she does not currently use drugs. Medical:  has a past medical history of Bipolar 1 disorder ([place]), Diabetes mellitus without complication ([place]), and Hypertension. Surgical: Ms. Bumgardner  has a past surgical history that includes Abdominal hysterectomy; Tonsillectomy; Cholecystectomy; Esophagogastroduodenoscopy (egd) with propofol (N/A, 06/23/2019); and Colonoscopy with propofol (N/A, 06/23/2019). Family: family history includes Bipolar disorder in her brother, paternal uncle, and paternal uncle; Mental illness in her father.  Laboratory Chemistry Profile   Renal Lab Results  Component Value Date   BUN 11 12/06/2019   CREATININE 0.59 12/06/2019   BCR 19 12/06/2019   GFRAA 114 12/06/2019   GFRNONAA 99 12/06/2019     Hepatic Lab Results  Component Value Date   AST 48 (H) 12/06/2019   ALT 47 (H) 12/06/2019   ALBUMIN 3.7 (L) 12/06/2019   ALKPHOS 122 (H) 12/06/2019     Electrolytes Lab Results  Component Value Date   NA 137 12/06/2019   K 3.9 12/06/2019   CL 101 12/06/2019   CALCIUM 9.7 12/06/2019     Bone No results found for: VD25OH, VD125OH2TOT, GP4982ME1, RA3094MH6, 25OHVITD1, 25OHVITD2, 25OHVITD3, TESTOFREE, TESTOSTERONE   Inflammation (CRP: Acute Phase) (ESR: Chronic Phase) No results found for: CRP, ESRSEDRATE, LATICACIDVEN     Note: Above Lab results reviewed.  Recent Imaging Review  MR LUMBAR SPINE WO CONTRAST CLINICAL DATA:  Osteoarthritis, lumbosacral Low back pain, > 6 wks Lumbar radiculopathy, > 6 wks.  EXAM: MRI LUMBAR SPINE WITHOUT CONTRAST  TECHNIQUE: Multiplanar, multisequence MR imaging of the lumbar spine was performed. No intravenous contrast was administered.  COMPARISON:  None.  FINDINGS: Segmentation:  Standard.  Alignment:   Physiologic.  Vertebrae:  No fracture, evidence of discitis, or bone lesion.  Conus medullaris and cauda equina: Conus extends to the L1 level. Conus and cauda equina appear normal.  Paraspinal and other soft tissues: Negative.  Disc levels:  L1-L2: Normal disc space and facet joints. No spinal canal stenosis. No neural foraminal stenosis.  L2-L3: Small disc bulge. No spinal canal stenosis. No neural foraminal stenosis.  L3-L4: Small disc bulge. No spinal canal stenosis. No neural foraminal stenosis.  L4-L5: Disc desiccation with minimal bulge. No spinal canal stenosis. No neural foraminal stenosis.  L5-S1: Normal disc space and facet joints. No spinal canal stenosis. No neural foraminal stenosis.  Visualized sacrum: Normal.  IMPRESSION: Minimal lumbar degenerative disc disease without spinal canal or neural foraminal stenosis.  Electronically Signed   By: Ulyses Jarred M.D.   On: 07/05/2021 21:29 Note: Reviewed        Physical Exam  General appearance: Well nourished, well developed, and well hydrated. In no apparent acute distress Mental status: Alert, oriented x 3 (person, place, &  time)       Respiratory: No evidence of acute respiratory distress Eyes: PERLA Vitals: BP (!) 125/42 (BP Location: Right Arm, Patient Position: Sitting, Cuff Size: Large)   Pulse 65   Temp (!) 97 F (36.1 C) (Temporal)   Resp 16   Ht '5\' 1"'  (1.549 m)   Wt 200 lb (90.7 kg)   SpO2 96%   BMI 37.79 kg/m  BMI: Estimated body mass index is 37.79 kg/m as calculated from the following:   Height as of this encounter: '5\' 1"'  (1.549 m).   Weight as of this encounter: 200 lb (90.7 kg). Ideal: Ideal body weight: 47.8 kg (105 lb 6.1 oz) Adjusted ideal body weight: 65 kg (143 lb 3.6 oz)    Lumbar Exam  Skin & Axial Inspection: No masses, redness, or swelling Alignment: Symmetrical Functional ROM: Pain restricted ROM affecting both sides Stability: No instability detected Muscle  Tone/Strength: Functionally intact. No obvious neuro-muscular anomalies detected.   Gait & Posture Assessment  Ambulation: Patient came in today in a wheel chair Gait: Limited. Using assistive device to ambulate Posture: Antalgic    Lower Extremity Exam      Side: Right lower extremity   Side: Left lower extremity  Stability: No instability observed           Stability: No instability observed          Skin & Extremity Inspection: Skin color, temperature, and hair growth are WNL. No peripheral edema or cyanosis. No masses, redness, swelling, asymmetry, or associated skin lesions. No contractures.   Skin & Extremity Inspection: Prior evidence of arthroscopic surgery  Functional ROM: Pain restricted ROM for knee joint           Functional ROM: Pain restricted ROM for hip and knee joints, positive straight leg raise test          Muscle Tone/Strength: Functionally intact. No obvious neuro-muscular anomalies detected.   Muscle Tone/Strength: Functionally intact. No obvious neuro-muscular anomalies detected.  Sensory (Neurological): Arthropathic arthralgia knee right foot pain        tender along greater trochanteric bursa  DTR: Patellar: deferred today Achilles: deferred today Plantar: deferred today   DTR: Patellar: deferred today Achilles: deferred today Plantar: deferred today  Palpation: No palpable anomalies   Palpation: No palpable anomalies       Assessment   Status Diagnosis  Persistent Having a Flare-up Persistent 1. History of left hip replacement (2017)   2. Trochanteric bursitis of left hip   3. Bilateral primary osteoarthritis of knee   4. Chronic pain syndrome   5. Neck pain on right side   6. Pain in joint of right shoulder   7. Cervical radicular pain   8. Encounter for long-term opiate analgesic use   9. Lumbar facet arthropathy       Updated Problems: Problem  Trochanteric Bursitis of Left Hip     Plan of Care   Ms. Teirra Carapia has a current  medication list which includes the following long-term medication(s): aripiprazole, benztropine, cetirizine, escitalopram, furosemide, gabapentin, lamotrigine, losartan, metformin, pantoprazole, spironolactone, bisoprolol-hydrochlorothiazide, ferrous sulfate, and glimepiride.  Pharmacotherapy (Medications Ordered): Meds ordered this encounter  Medications   HYDROcodone-acetaminophen (NORCO) 10-325 MG tablet    Sig: Take 1 tablet by mouth 3 (three) times daily as needed. For chronic pain syndrome. To last for 30 days from fill date.    Dispense:  90 tablet    Refill:  0    Ok to fill 1 day  early if pharmacy closed on fill date   HYDROcodone-acetaminophen (NORCO) 10-325 MG tablet    Sig: Take 1 tablet by mouth 3 (three) times daily as needed. For chronic pain syndrome. To last for 30 days from fill date.    Dispense:  90 tablet    Refill:  0    Ok to fill 1 day early if pharmacy closed on fill date   HYDROcodone-acetaminophen (NORCO) 10-325 MG tablet    Sig: Take 1 tablet by mouth 3 (three) times daily as needed. For chronic pain syndrome. To last for 30 days from fill date.    Dispense:  90 tablet    Refill:  0    Ok to fill 1 day early if pharmacy closed on fill date    Orders:  Orders Placed This Encounter  Procedures   HIP INJECTION    Standing Status:   Future    Standing Expiration Date:   12/03/2021    Scheduling Instructions:     Purpose: Therapeutic/Diagnostic     Indication: Hip pain 2ry to Trochanteric Burlitis unspecified side (M70.60).     Side: LEFT     Sedation: Patient's choice.     Timeframe: As soon as the schedule permits.    Follow-up plan:   Return in about 3 months (around 12/03/2021) for Medication Management, in person.      Interventional management options: Planned, scheduled, and/or pending:    Lumbar epidural steroid injection at L4-L5 without sedation Intra-articular knee Hyalgan series #1 done 08/28/20 helped significantly (100% pain relief for 4  months), repeat as needed Plan for left hip greater trochanteric bursa injection with local only.   Considering:   Lumbar epidural steroid injection, lumbar facet medial branch nerve block pending lumbar MRI   PRN Procedures:   Repeat bilateral intra-articular Hyalgan injection        Recent Visits No visits were found meeting these conditions. Showing recent visits within past 90 days and meeting all other requirements Today's Visits Date Type Provider Dept  09/02/21 Office Visit Gillis Santa, MD [facility]  Showing today's visits and meeting all other requirements Future Appointments Date Type Provider Dept  11/27/21 Appointment Gillis Santa, MD [facility]  Showing future appointments within next 90 days and meeting all other requirements I discussed the assessment and treatment plan with the patient. The patient was provided an opportunity to ask questions and all were answered. The patient agreed with the plan and demonstrated an understanding of the instructions.  Patient advised to call back or seek an in-person evaluation if the symptoms or condition worsens.  Duration of encounter: 2mnutes.  Note by: BGillis Santa MD Date: 09/02/2021; Time: 9:43 AM

## 2021-09-05 ENCOUNTER — Telehealth: Payer: Self-pay

## 2021-09-05 NOTE — Telephone Encounter (Signed)
Pt needs prior auth for meds before Monday 11/06

## 2021-09-08 NOTE — Telephone Encounter (Signed)
PA sent via [employer] with records. LP

## 2021-09-09 ENCOUNTER — Other Ambulatory Visit: Payer: Self-pay

## 2021-09-09 ENCOUNTER — Telehealth (INDEPENDENT_AMBULATORY_CARE_PROVIDER_SITE_OTHER): Payer: Medicaid Other | Admitting: Psychiatry

## 2021-09-09 ENCOUNTER — Encounter: Payer: Self-pay | Admitting: Psychiatry

## 2021-09-09 DIAGNOSIS — R251 Tremor, unspecified: Secondary | ICD-10-CM | POA: Diagnosis not present

## 2021-09-09 DIAGNOSIS — F3176 Bipolar disorder, in full remission, most recent episode depressed: Secondary | ICD-10-CM | POA: Diagnosis not present

## 2021-09-09 DIAGNOSIS — F410 Panic disorder [episodic paroxysmal anxiety] without agoraphobia: Secondary | ICD-10-CM | POA: Diagnosis not present

## 2021-09-09 MED ORDER — ARIPIPRAZOLE 15 MG PO TABS: 7.5000 mg | ORAL_TABLET | Freq: Every day | ORAL | 0 refills | Status: DC

## 2021-09-09 MED ORDER — GABAPENTIN 600 MG PO TABS: 600.0000 mg | ORAL_TABLET | Freq: Every day | ORAL | 0 refills | Status: DC

## 2021-09-09 MED ORDER — LAMOTRIGINE 200 MG PO TABS: 200.0000 mg | ORAL_TABLET | Freq: Every day | ORAL | 0 refills | Status: DC

## 2021-09-09 MED ORDER — HYDROXYZINE PAMOATE 25 MG PO CAPS: ORAL_CAPSULE | ORAL | 0 refills | Status: DC

## 2021-09-09 NOTE — Progress Notes (Signed)
Virtual Visit via Video Note  I connected with Margaret [name] on 09/09/21 at 11:30 AM EST by a video enabled telemedicine application and verified that I am speaking with the correct person using two identifiers.  Location Provider Location : [facility] Patient Location : Home  Participants: Patient , Provider    I discussed the limitations of evaluation and management by telemedicine and the availability of in person appointments. The patient expressed understanding and agreed to proceed.   I discussed the assessment and treatment plan with the patient. The patient was provided an opportunity to ask questions and all were answered. The patient agreed with the plan and demonstrated an understanding of the instructions.   The patient was advised to call back or seek an in-person evaluation if the symptoms worsen or if the condition fails to improve as anticipated.                                                        [place] MD OP Progress Note  09/09/2021 11:43 AM Randalyn Ahmed  MRN:  [id number]  Chief Complaint:  Chief Complaint   Follow-up; Anxiety; Depression    HPI: Margaret [name] is a 65 year old Caucasian female on disability, divorced, lives in [place], has a history of bipolar disorder, bereavement, hyperlipidemia, panic attacks, chronic pain was evaluated by telemedicine today.  Patient today reports overall mood symptoms are stable.  She got to spend some time with her 90 year old grandson and her 81-year-old grandson.  That helped her a lot.  She reports she does have a lot of hip pain and is scheduled to see her pain provider soon.  She looks forward to that.  That does have an impact on her mood however overall she has been coping okay.  Reports sleep is good.  Denies any suicidality, homicidality or perceptual disturbances.  Reports appetite is fair.  Does have tremors on and off likely from medications however  uses the benztropine once or twice every month or so.  So far that  has been managing her tremors well.  Denies any other concerns at this time.  Visit Diagnosis:    ICD-10-CM   1. Bipolar disorder, in full remission, most recent episode depressed ([place])  F31.76     2. Panic attacks  F41.0       Past Psychiatric History: I have reviewed past psychiatric history from progress note on 08/21/2019.  Past trials of Depakote, lithium, risperidone, Xanax  Past Medical History:  Past Medical History:  Diagnosis Date   Bipolar 1 disorder ([place])    Diabetes mellitus without complication ([place])    Type 2   Hypertension     Past Surgical History:  Procedure Laterality Date   ABDOMINAL HYSTERECTOMY     CHOLECYSTECTOMY     COLONOSCOPY WITH PROPOFOL N/A 06/23/2019   Procedure: COLONOSCOPY WITH PROPOFOL;  Surgeon: Jonathon Bellows, MD;  Location: [facility] ENDOSCOPY;  Service: Gastroenterology;  Laterality: N/A;   ESOPHAGOGASTRODUODENOSCOPY (EGD) WITH PROPOFOL N/A 06/23/2019   Procedure: ESOPHAGOGASTRODUODENOSCOPY (EGD) WITH PROPOFOL;  Surgeon: Jonathon Bellows, MD;  Location: [facility] ENDOSCOPY;  Service: Gastroenterology;  Laterality: N/A;   TONSILLECTOMY      Family Psychiatric History: Reviewed family psychiatric history from progress note on 08/21/2019  Family History:  Family History  Problem Relation Age of Onset   Mental illness Father  Bipolar disorder Brother    Bipolar disorder Paternal Uncle    Bipolar disorder Paternal Uncle     Social History: Reviewed social history from progress note on 08/21/2019 Social History   Socioeconomic History   Marital status: Divorced    Spouse name: Not on file   Number of children: 2   Years of education: Not on file   Highest education level: Not on file  Occupational History   Not on file  Tobacco Use   Smoking status: Former    Types: Cigarettes    Quit date: 03/03/2011    Years since quitting: 10.5   Smokeless tobacco: Never  Vaping Use   Vaping Use: Never used  Substance and Sexual Activity   Alcohol use: Not  Currently   Drug use: Not Currently   Sexual activity: Not Currently  Other Topics Concern   Not on file  Social History Narrative   Not on file   Social Determinants of Health   Financial Resource Strain: Not on file  Food Insecurity: Not on file  Transportation Needs: Not on file  Physical Activity: Not on file  Stress: Not on file  Social Connections: Not on file    Allergies:  Allergies  Allergen Reactions   Lithium Nausea And Vomiting and Other (See Comments)   Penicillins Hives    Resulted in hospitalization   Sulfa Antibiotics Hives    Resulted in hospitalization    Metabolic Disorder Labs: Lab Results  Component Value Date   HGBA1C 7.3 (H) 06/22/2019   MPG 162.81 06/22/2019   No results found for: PROLACTIN No results found for: CHOL, TRIG, HDL, CHOLHDL, VLDL, LDLCALC Lab Results  Component Value Date   TSH 1.737 11/16/2019    Therapeutic Level Labs: No results found for: LITHIUM No results found for: VALPROATE No components found for:  CBMZ  Current Medications: Current Outpatient Medications  Medication Sig Dispense Refill   acetaminophen (TYLENOL) 500 MG tablet Take 500 mg by mouth every 6 (six) hours as needed.      alendronate (FOSAMAX) 35 MG tablet Take 35 mg by mouth every 7 (seven) days. Take with a full glass of water on an empty stomach.     amLODipine (NORVASC) 5 MG tablet Take 2 mg by mouth daily.     amLODipine (NORVASC) 5 MG tablet Take by mouth.     ARIPiprazole (ABILIFY) 15 MG tablet Take 0.5 tablets (7.5 mg total) by mouth daily. 45 tablet 0   B Complex-C (B-COMPLEX WITH VITAMIN C) tablet Take 1 tablet by mouth daily. 30 tablet 2   benztropine (COGENTIN) 0.5 MG tablet Take 1 tablet (0.5 mg total) by mouth daily as needed for tremors. 15 tablet 1   bisacodyl (DULCOLAX) 5 MG EC tablet Take 5 mg by mouth daily as needed for moderate constipation.     bisoprolol-hydrochlorothiazide (ZIAC) 5-6.25 MG tablet Take 1 tablet by mouth daily.      cetirizine (ZYRTEC) 10 MG tablet Take 10 mg by mouth daily.     cyclobenzaprine (FLEXERIL) 10 MG tablet TAKE 1 TABLET BY MOUTHATHREE TIMES A DAY AS NEEDED.     Dulaglutide (TRULICITY) 6.64 QI/3.4VQ SOPN Inject 0.75 mg into the skin once a week. Thursday     escitalopram (LEXAPRO) 10 MG tablet TAKE 1 1/2 TABLET BY MOUTH DAILY. 135 tablet 0   ferrous sulfate 325 (65 FE) MG tablet Take 1 tablet (325 mg total) by mouth 2 (two) times daily with a meal. (Patient not taking:  Reported on 05/27/2021) 120 tablet 0   furosemide (LASIX) 20 MG tablet Take 10 mg by mouth daily as needed.     gabapentin (NEURONTIN) 600 MG tablet Take 1 tablet (600 mg total) by mouth daily. 270 tablet 0   glimepiride (AMARYL) 2 MG tablet Take 2 mg by mouth daily with breakfast.      HYDROcodone-acetaminophen (NORCO) 10-325 MG tablet Take 1 tablet by mouth 3 (three) times daily as needed. For chronic pain syndrome. To last for 30 days from fill date. 90 tablet 0   [START ON 10/06/2021] HYDROcodone-acetaminophen (NORCO) 10-325 MG tablet Take 1 tablet by mouth 3 (three) times daily as needed. For chronic pain syndrome. To last for 30 days from fill date. 90 tablet 0   [START ON 11/05/2021] HYDROcodone-acetaminophen (NORCO) 10-325 MG tablet Take 1 tablet by mouth 3 (three) times daily as needed. For chronic pain syndrome. To last for 30 days from fill date. 90 tablet 0   hydrOXYzine (VISTARIL) 25 MG capsule TAKE 1 OR 2 CAPSULES BY MOUTH ONCE DAILY AS NEEDED. TAKE ONLY FOR SEVERE PANIC ATTACKS. 60 capsule 0   insulin glargine (LANTUS) 100 UNIT/ML Solostar Pen Inject into the skin.     lamoTRIgine (LAMICTAL) 200 MG tablet Take 1 tablet (200 mg total) by mouth daily. 90 tablet 0   losartan (COZAAR) 25 MG tablet Take 25 mg by mouth daily. Takes differently     meclizine (ANTIVERT) 25 MG tablet Take 25 mg by mouth 2 (two) times daily as needed.     meloxicam (MOBIC) 7.5 MG tablet TAKE 1 TABLET BY MOUTH EVERY DAY     metFORMIN (GLUCOPHAGE) 500  MG tablet Take 500 mg by mouth 2 (two) times daily with a meal.     pantoprazole (PROTONIX) 40 MG tablet Take 40 mg by mouth daily.     spironolactone (ALDACTONE) 25 MG tablet Take 25 mg by mouth daily.     tiZANidine (ZANAFLEX) 2 MG tablet Take by mouth.     No current facility-administered medications for this visit.     Musculoskeletal: Strength & Muscle Tone:  UTA Gait & Station:  Seated Patient leans: N/A  Psychiatric Specialty Exam: Review of Systems  Musculoskeletal:        Left sided hip pain  Psychiatric/Behavioral:  Negative for agitation, behavioral problems, confusion, decreased concentration, dysphoric mood, hallucinations, self-injury, sleep disturbance and suicidal ideas. The patient is not nervous/anxious and is not hyperactive.   All other systems reviewed and are negative.  There were no vitals taken for this visit.There is no height or weight on file to calculate BMI.  General Appearance: Casual  Eye Contact:  Fair  Speech:  Clear and Coherent  Volume:  Normal  Mood:  Euthymic  Affect:  Congruent  Thought Process:  Goal Directed and Descriptions of Associations: Intact  Orientation:  Full (Time, Place, and Person)  Thought Content: Logical   Suicidal Thoughts:  No  Homicidal Thoughts:  No  Memory:  Immediate;   Fair Recent;   Fair Remote;   Fair  Judgement:  Fair  Insight:  Fair  Psychomotor Activity:  Normal  Concentration:  Concentration: Fair and Attention Span: Fair  Recall:  [employer] of Knowledge: Fair  Language: Fair  Akathisia:  No  Handed:  Right  AIMS (if indicated): done, 0  Assets:  Communication Skills Desire for Improvement Housing Transportation  ADL's:  Intact  Cognition: WNL  Sleep:  Fair   Screenings: AIMS  [place] Office Visit from 03/04/2021 in [place] [facility] Total Score 0      PHQ2-9    [place] Office Visit from 09/02/2021 in [facility] Office Visit from 03/04/2021 in [facility] Video Visit from 12/23/2020 in [place] Counselor from 12/09/2020 in [facility] Procedure visit from 09/18/2020 in [facility]  PHQ-2 Total Score 0 1 0 5 0  PHQ-9 Total Score -- -- -- 15 --      [place] Office Visit from 03/04/2021 in [facility] Video Visit from 12/23/2020 in [facility] Counselor from 12/09/2020 in [facility] No Risk No Risk No Risk        Assessment and Plan: Margaret [name] is a 65 year old Caucasian female on disability, divorced, lives in [place], has a history of bipolar disorder, chronic pain, multiple medical problems was evaluated by telemedicine today.  Patient is currently stable however continues to struggle with pain and has upcoming appointment with pain management.  Plan as noted below.  Plan Bipolar disorder in remission Lamotrigine 200 mg p.o. daily Abilify 7.5 mg p.o. daily.  Discussed reducing the dosage of atypical antipsychotic-Abilify since her symptoms has been stable.  She is aware about the long-term risk of being on medications like Abilify.  Patient however wants to stay on this dose for now. Gabapentin 600 mg p.o. daily Continue benztropine 0.5 mg as needed for tremors.  She uses it once or twice a month only.  Panic attacks-stable Lexapro 15 mg p.o. daily Hydroxyzine 25-50 mg p.o. daily as needed for severe panic attacks only   Patient will need sufficient pain management.  Patient to contact her pain provider.  Reports she has upcoming appointment.  Follow-up in clinic in 3 months or sooner if needed.  This note was generated in part or whole with voice recognition software. Voice recognition is usually quite accurate but there are  transcription errors that can and very often do occur. I apologize for any typographical errors that were not detected and corrected.     Ursula Alert, MD 09/09/2021, 11:43 AM

## 2021-09-10 ENCOUNTER — Ambulatory Visit (HOSPITAL_BASED_OUTPATIENT_CLINIC_OR_DEPARTMENT_OTHER): Payer: Medicaid Other | Admitting: Student in an Organized Health Care Education/Training Program

## 2021-09-10 ENCOUNTER — Encounter: Payer: Self-pay | Admitting: Student in an Organized Health Care Education/Training Program

## 2021-09-10 ENCOUNTER — Other Ambulatory Visit: Payer: Self-pay

## 2021-09-10 ENCOUNTER — Ambulatory Visit
Admission: RE | Admit: 2021-09-10 | Discharge: 2021-09-10 | Disposition: A | Discharge status: Home / Self Care | Payer: Medicaid Other | Source: Ambulatory Visit | Attending: Student in an Organized Health Care Education/Training Program | Admitting: Student in an Organized Health Care Education/Training Program

## 2021-09-10 DIAGNOSIS — Z96642 Presence of left artificial hip joint: Secondary | ICD-10-CM | POA: Diagnosis present

## 2021-09-10 DIAGNOSIS — G894 Chronic pain syndrome: Secondary | ICD-10-CM | POA: Insufficient documentation

## 2021-09-10 DIAGNOSIS — M7062 Trochanteric bursitis, left hip: Secondary | ICD-10-CM | POA: Diagnosis present

## 2021-09-10 MED ORDER — ROPIVACAINE HCL 2 MG/ML IJ SOLN
9.0000 mL | Freq: Once | INTRAMUSCULAR | Status: AC
  Administered 2021-09-10: 9 mL via PERINEURAL

## 2021-09-10 MED ORDER — IOHEXOL 180 MG/ML  SOLN
10.0000 mL | Freq: Once | INTRAMUSCULAR | Status: AC
  Administered 2021-09-10: 5 mL via INTRA_ARTICULAR

## 2021-09-10 MED ORDER — LIDOCAINE HCL 2 % IJ SOLN
20.0000 mL | Freq: Once | INTRAMUSCULAR | Status: AC
  Administered 2021-09-10: 200 mg
  Filled 2021-09-10: qty 20

## 2021-09-10 MED ORDER — ROPIVACAINE HCL 2 MG/ML IJ SOLN
INTRAMUSCULAR | Status: AC
  Filled 2021-09-10: qty 20

## 2021-09-10 MED ORDER — DEXAMETHASONE SODIUM PHOSPHATE 10 MG/ML IJ SOLN
10.0000 mg | Freq: Once | INTRAMUSCULAR | Status: AC
  Administered 2021-09-10: 10 mg
  Filled 2021-09-10: qty 1

## 2021-09-10 NOTE — Progress Notes (Signed)
PROVIDER NOTE: Information contained herein reflects review and annotations entered in association with encounter. Interpretation of such information and data should be left to medically-trained personnel. Information provided to patient can be located elsewhere in the medical record under "Patient Instructions". Document created using STT-dictation technology, any transcriptional errors that may result from process are unintentional.    Patient: Margaret [name]  Service Category: Procedure Provider: Gillis Santa, MD DOB: October 07, 1956 DOS: 09/10/2021 Location: [facility] MRN: [id number] Setting: Ambulatory - outpatient Referring Provider: Gillis Santa, MD Type: Established Patient Specialty: Interventional Pain Management PCP: Rusty Aus, MD  Primary Reason for Visit: Interventional Pain Management Treatment. CC: Hip Pain (left)    Procedure:          Anesthesia, Analgesia, Anxiolysis:  Type: Hip bursa injection #1  Primary Purpose: Diagnostic Region: Upper (proximal) Femoral Region Level: Hip Joint Target Area: Trochanteric Bursa Approach: Anterior approach Laterality: Left  Type: Local Anesthesia Local Anesthetic: Lidocaine 1-2% Sedation: None  Indication(s):  Analgesia Route: Infiltration (Barron/IM) IV Access: N/A   Position: Supine   Indications: 1. Chronic pain syndrome   2. History of left hip replacement (2017)   3. Trochanteric bursitis of left hip    Pain Score: Pre-procedure: 7 /10 Post-procedure: 0-No pain/10     Pre-op H&P Assessment:  Margaret [name] is a 65 y.o. (year old), female patient, seen today for interventional treatment. She  has a past surgical history that includes Abdominal hysterectomy; Tonsillectomy; Cholecystectomy; Esophagogastroduodenoscopy (egd) with propofol (N/A, 06/23/2019); and Colonoscopy with propofol (N/A, 06/23/2019). Margaret [name] has a current medication list which includes the following prescription(s): acetaminophen, alendronate,  amlodipine, amlodipine, aripiprazole, b-complex with vitamin c, benztropine, bisacodyl, cefuroxime, cetirizine, cyclobenzaprine, trulicity, escitalopram, furosemide, gabapentin, hydrocodone-acetaminophen, [START ON 10/06/2021] hydrocodone-acetaminophen, [START ON 11/05/2021] hydrocodone-acetaminophen, hydroxyzine, insulin glargine, lamotrigine, losartan, meclizine, meloxicam, metformin, pantoprazole, spironolactone, tizanidine, bisoprolol-hydrochlorothiazide, ferrous sulfate, and glimepiride. Her primarily concern today is the Hip Pain (left)  Initial Vital Signs:  Pulse/HCG Rate: 62ECG Heart Rate: 63 Temp: (!) 97 F (36.1 C) Resp: 16 BP: (!) 111/36 SpO2: 97 %  BMI: Estimated body mass index is 39.06 kg/m as calculated from the following:   Height as of this encounter: 5' (1.524 m).   Weight as of this encounter: 200 lb (90.7 kg).  Risk Assessment: Allergies: Reviewed. She is allergic to lithium, penicillins, and sulfa antibiotics.  Allergy Precautions: None required Coagulopathies: Reviewed. None identified.  Blood-thinner therapy: None at this time Active Infection(s): Reviewed. None identified. Margaret [name] is afebrile  Site Confirmation: [name] was asked to confirm the procedure and laterality before marking the site Procedure checklist: Completed Consent: Before the procedure and under the influence of no sedative(s), amnesic(s), or anxiolytics, the patient was informed of the treatment options, risks and possible complications. To fulfill our ethical and legal obligations, as recommended by the American Medical Association's Code of Ethics, I have informed the patient of my clinical impression; the nature and purpose of the treatment or procedure; the risks, benefits, and possible complications of the intervention; the alternatives, including doing nothing; the risk(s) and benefit(s) of the alternative treatment(s) or procedure(s); and the risk(s) and benefit(s) of doing nothing. The  patient was provided information about the general risks and possible complications associated with the procedure. These may include, but are not limited to: failure to achieve desired goals, infection, bleeding, organ or nerve damage, allergic reactions, paralysis, and death. In addition, the patient was informed of those risks and complications associated to the procedure, such as  failure to decrease pain; infection; bleeding; organ or nerve damage with subsequent damage to sensory, motor, and/or autonomic systems, resulting in permanent pain, numbness, and/or weakness of one or several areas of the body; allergic reactions; (i.e.: anaphylactic reaction); and/or death. Furthermore, the patient was informed of those risks and complications associated with the medications. These include, but are not limited to: allergic reactions (i.e.: anaphylactic or anaphylactoid reaction(s)); adrenal axis suppression; blood sugar elevation that in diabetics may result in ketoacidosis or comma; water retention that in patients with history of congestive heart failure may result in shortness of breath, pulmonary edema, and decompensation with resultant heart failure; weight gain; swelling or edema; medication-induced neural toxicity; particulate matter embolism and blood vessel occlusion with resultant organ, and/or nervous system infarction; and/or aseptic necrosis of one or more joints. Finally, the patient was informed that Medicine is not an exact science; therefore, there is also the possibility of unforeseen or unpredictable risks and/or possible complications that may result in a catastrophic outcome. The patient indicated having understood very clearly. We have given the patient no guarantees and we have made no promises. Enough time was given to the patient to ask questions, all of which were answered to the patient's satisfaction. Margaret [name] has indicated that she wanted to continue with the procedure. Attestation:  I, the ordering provider, attest that I have discussed with the patient the benefits, risks, side-effects, alternatives, likelihood of achieving goals, and potential problems during recovery for the procedure that I have provided informed consent. Date  Time: 09/10/2021 10:49 AM  Pre-Procedure Preparation:  Monitoring: As per clinic protocol. Respiration, ETCO2, SpO2, BP, heart rate and rhythm monitor placed and checked for adequate function Safety Precautions: Patient was assessed for positional comfort and pressure points before starting the procedure. Time-out: I initiated and conducted the "Time-out" before starting the procedure, as per protocol. The patient was asked to participate by confirming the accuracy of the "Time Out" information. Verification of the correct person, site, and procedure were performed and confirmed by me, the nursing staff, and the patient. "Time-out" conducted as per Joint Commission's Universal Protocol (UP.01.01.01). Time: 1203  Description of Procedure:          Area Prepped: Entire Posterolateral hip area. DuraPrep (Iodine Povacrylex [0.7% available iodine] and Isopropyl Alcohol, 74% w/w) Safety Precautions: Aspiration looking for blood return was conducted prior to all injections. At no point did we inject any substances, as a needle was being advanced. No attempts were made at seeking any paresthesias. Safe injection practices and needle disposal techniques used. Medications properly checked for expiration dates. SDV (single dose vial) medications used. Description of the Procedure: Protocol guidelines were followed. The patient was placed in position over the procedure table. The target area was identified and the area prepped in the usual manner. Skin & deeper tissues infiltrated with local anesthetic. Appropriate amount of time allowed to pass for local anesthetics to take effect. The procedure needles were then advanced to the target area. Proper needle placement  secured. Negative aspiration confirmed. Solution injected in intermittent fashion, asking for systemic symptoms every 0.5cc of injectate. The needles were then removed and the area cleansed, making sure to leave some of the prepping solution back to take advantage of its long term bactericidal properties. Vitals:   09/10/21 1158 09/10/21 1201 09/10/21 1206 09/10/21 1210  BP: (!) 120/54 (!) 111/48 (!) 122/53 (!) 123/55  Pulse:      Resp: 16 13 (!) 21 17  Temp:  TempSrc:      SpO2: 100% 99% 98% 98%  Weight:      Height:        Start Time: 1203 hrs. End Time: 1209 hrs.           Materials:  Needle(s) Type: Spinal Needle Gauge: 22G Length: 5.0-in Medication(s): Please see orders for medications and dosing details. 5 cc solution made of 4 cc of 0.2% ropivacaine, 1 cc of Decadron 10 mg/cc. Injected into bursa after contrast confirmation  Imaging Guidance (Non-Spinal):          Type of Imaging Technique: Fluoroscopy Guidance (Non-Spinal) Indication(s): Assistance in needle guidance and placement for procedures requiring needle placement in or near specific anatomical locations not easily accessible without such assistance. Exposure Time: Please see nurses notes. Contrast: Before injecting any contrast, we confirmed that the patient did not have an allergy to iodine, shellfish, or radiological contrast. Once satisfactory needle placement was completed at the desired level, radiological contrast was injected. Contrast injected under live fluoroscopy. No contrast complications. See chart for type and volume of contrast used. Fluoroscopic Guidance: I was personally present during the use of fluoroscopy. "Tunnel Vision Technique" used to obtain the best possible view of the target area. Parallax error corrected before commencing the procedure. "Direction-depth-direction" technique used to introduce the needle under continuous pulsed fluoroscopy. Once target was reached, antero-posterior,  oblique, and lateral fluoroscopic projection used confirm needle placement in all planes. Images permanently stored in EMR. Interpretation: I personally interpreted the imaging intraoperatively. Adequate needle placement confirmed in multiple planes. Appropriate spread of contrast into desired area was observed. No evidence of afferent or efferent intravascular uptake. Permanent images saved into the patient's record.  Post-operative Assessment:  Post-procedure Vital Signs:  Pulse/HCG Rate: 6262 Temp:  (!) 97 F (36.1 C) Resp: 17 BP:  (!) 123/55 SpO2: 98 %  EBL: None  Complications: No immediate post-treatment complications observed by team, or reported by patient.  Note: The patient tolerated the entire procedure well. A repeat set of vitals were taken after the procedure and the patient was kept under observation following institutional policy, for this type of procedure. Post-procedural neurological assessment was performed, showing return to baseline, prior to discharge. The patient was provided with post-procedure discharge instructions, including a section on how to identify potential problems. Should any problems arise concerning this procedure, the patient was given instructions to immediately contact us, at any time, without hesitation. In any case, we plan to contact the patient by telephone for a follow-up status report regarding this interventional procedure.  Comments:  No additional relevant information.  Plan of Care  Orders:  Orders Placed This Encounter  Procedures   DG PAIN [facility] C-ARM 1-60 MIN NO REPORT    Intraoperative interpretation by procedural physician at [place].    Standing Status:   Standing    Number of Occurrences:   1    Order Specific Question:   Reason for exam:    Answer:   Assistance in needle guidance and placement for procedures requiring needle placement in or near specific anatomical locations not easily accessible without such  assistance.   Chronic Opioid Analgesic:  Norco 10 mg TID prn   Medications ordered for procedure: Meds ordered this encounter  Medications   iohexol (OMNIPAQUE) 180 MG/ML injection 10 mL    Must be Myelogram-compatible. If not available, you may substitute with a water-soluble, non-ionic, hypoallergenic, myelogram-compatible radiological contrast medium.   lidocaine (XYLOCAINE) 2 % (with pres) injection  400 mg   ropivacaine (PF) 2 mg/mL (0.2%) (NAROPIN) injection 9 mL   dexamethasone (DECADRON) injection 10 mg   Medications administered: We administered iohexol, lidocaine, ropivacaine (PF) 2 mg/mL (0.2%), and dexamethasone.  See the medical record for exact dosing, route, and time of administration.  Follow-up plan:   Return in about 4 weeks (around 10/08/2021).     Interventional management options: Planned, scheduled, and/or pending:    Lumbar epidural steroid injection at L4-L5 without sedation Intra-articular knee Hyalgan series #1 done 08/28/20 helped significantly (100% pain relief for 4 months), repeat as needed Plan for left hip greater trochanteric bursa injection with local only.   Considering:   Lumbar epidural steroid injection, lumbar facet medial branch nerve block pending lumbar MRI   PRN Procedures:   Repeat bilateral intra-articular Hyalgan injection        Recent Visits Date Type Provider Dept  09/02/21 Office Visit Margaret Santa, MD [facility]  Showing recent visits within past 90 days and meeting all other requirements Today's Visits Date Type Provider Dept  09/10/21 Procedure visit Margaret Santa, MD [facility]  Showing today's visits and meeting all other requirements Future Appointments Date Type Provider Dept  10/08/21 Appointment Margaret Santa, MD [facility]  11/27/21 Appointment Margaret Santa, MD [facility]  Showing future appointments within next 90 days and meeting all other requirements Disposition:  Discharge home  Discharge (Date  Time): 09/10/2021; 1217 hrs.   Primary Care Physician: Rusty Aus, MD Location: [facility] Note by: Margaret Santa, MD Date: 09/10/2021; Time: 1:31 PM  Disclaimer:  Medicine is not an exact science. The only guarantee in medicine is that nothing is guaranteed. It is important to note that the decision to proceed with this intervention was based on the information collected from the patient. The Data and conclusions were drawn from the patient's questionnaire, the interview, and the physical examination. Because the information was provided in large part by the patient, it cannot be guaranteed that it has not been purposely or unconsciously manipulated. Every effort has been made to obtain as much relevant data as possible for this evaluation. It is important to note that the conclusions that lead to this procedure are derived in large part from the available data. Always take into account that the treatment will also be dependent on availability of resources and existing treatment guidelines, considered by other Pain Management Practitioners as being common knowledge and practice, at the time of the intervention. For Medico-Legal purposes, it is also important to point out that variation in procedural techniques and pharmacological choices are the acceptable norm. The indications, contraindications, technique, and results of the above procedure should only be interpreted and judged by a Board-Certified Interventional Pain Specialist with extensive familiarity and expertise in the same exact procedure and technique.

## 2021-09-10 NOTE — Patient Instructions (Signed)

## 2021-09-10 NOTE — Progress Notes (Signed)
Safety precautions to be maintained throughout the outpatient stay will include: orient to surroundings, keep bed in low position, maintain call bell within reach at all times, provide assistance with transfer out of bed and ambulation.  

## 2021-09-11 ENCOUNTER — Telehealth: Payer: Self-pay

## 2021-09-11 NOTE — Telephone Encounter (Signed)
Called PP. No answer,VM left to call if needed.

## 2021-10-03 ENCOUNTER — Telehealth: Payer: Self-pay | Admitting: Student in an Organized Health Care Education/Training Program

## 2021-10-03 NOTE — Telephone Encounter (Signed)
Patient lvmail asking for PA on her pain meds. She needs to pick up Monday.

## 2021-10-07 ENCOUNTER — Encounter: Payer: Self-pay | Admitting: Student in an Organized Health Care Education/Training Program

## 2021-10-08 ENCOUNTER — Encounter: Payer: Self-pay | Admitting: Student in an Organized Health Care Education/Training Program

## 2021-10-08 ENCOUNTER — Ambulatory Visit
Payer: Medicaid Other | Attending: Student in an Organized Health Care Education/Training Program | Admitting: Student in an Organized Health Care Education/Training Program

## 2021-10-08 ENCOUNTER — Other Ambulatory Visit: Payer: Self-pay

## 2021-10-08 DIAGNOSIS — M170 Bilateral primary osteoarthritis of knee: Secondary | ICD-10-CM

## 2021-10-08 DIAGNOSIS — M7061 Trochanteric bursitis, right hip: Secondary | ICD-10-CM

## 2021-10-08 DIAGNOSIS — G894 Chronic pain syndrome: Secondary | ICD-10-CM | POA: Diagnosis not present

## 2021-10-08 DIAGNOSIS — Z96642 Presence of left artificial hip joint: Secondary | ICD-10-CM

## 2021-10-08 DIAGNOSIS — M25511 Pain in right shoulder: Secondary | ICD-10-CM

## 2021-10-08 DIAGNOSIS — M542 Cervicalgia: Secondary | ICD-10-CM

## 2021-10-08 DIAGNOSIS — M7062 Trochanteric bursitis, left hip: Secondary | ICD-10-CM | POA: Diagnosis not present

## 2021-10-08 NOTE — Patient Instructions (Signed)

## 2021-10-08 NOTE — Progress Notes (Signed)
Patient: Margaret [name]  Service Category: E/M  Provider: Gillis Santa, MD  DOB: [birth year]  DOS: 10/08/2021  Location: Office  MRN: [id number]  Setting: Ambulatory outpatient  Referring Provider: Rusty Aus, MD  Type: Established Patient  Specialty: Interventional Pain Management  PCP: Rusty Aus, MD  Location: Remote location  Delivery: TeleHealth     Virtual Encounter - Pain Management PROVIDER NOTE: Information contained herein reflects review and annotations entered in association with encounter. Interpretation of such information and data should be left to medically-trained personnel. Information provided to patient can be located elsewhere in the medical record under "Patient Instructions". Document created using STT-dictation technology, any transcriptional errors that may result from process are unintentional.    Contact & Pharmacy Preferred: [place]: [phone] (home) Mobile: [phone] (mobile) E-mail: [email or web address]  [place], [place] [address] Phone: [phone] Fax: [phone]   Pre-screening  Margaret [name] offered "in-person" vs "virtual" encounter. She indicated preferring virtual for this encounter.   Reason COVID-19*  Social distancing based on CDC and AMA recommendations.   I contacted Margaret [name] on 10/08/2021 via telephone.      I clearly identified myself as Gillis Santa, MD. I verified that I was speaking with the correct person using two identifiers (Name: Evelina Lore, and date of birth: May 03, 1956).  Consent I sought verbal advanced consent from Margaret [name] for virtual visit interactions. I informed Margaret [name] of possible security and privacy concerns, risks, and limitations associated with providing "not-in-person" medical evaluation and management services. I also informed Margaret [name] of the availability of "in-person" appointments. Finally, I informed her that there would  be a charge for the virtual visit and that she could be  personally, fully or partially, financially responsible for it. Margaret [name] expressed understanding and agreed to proceed.   Historic Elements   Margaret [name] is a 65 y.o. year old, female patient evaluated today after our last contact on 10/03/2021. Margaret [name]  has a past medical history of Bipolar 1 disorder ([place]), Diabetes mellitus without complication ([place]), and Hypertension. She also  has a past surgical history that includes Abdominal hysterectomy; Tonsillectomy; Cholecystectomy; Esophagogastroduodenoscopy (egd) with propofol (N/A, 06/23/2019); and Colonoscopy with propofol (N/A, 06/23/2019). Margaret [name] has a current medication list which includes the following prescription(s): acetaminophen, alendronate, amlodipine, amlodipine, aripiprazole, b-complex with vitamin c, benztropine, bisacodyl, cetirizine, cyclobenzaprine, trulicity, escitalopram, furosemide, gabapentin, hydrocodone-acetaminophen, [START ON 11/05/2021] hydrocodone-acetaminophen, hydroxyzine, insulin glargine, lamotrigine, losartan, meclizine, meloxicam, metformin, pantoprazole, spironolactone, tizanidine, bisoprolol-hydrochlorothiazide, cefuroxime, ferrous sulfate, and glimepiride. She  reports that she quit smoking about 10 years ago. Her smoking use included cigarettes. She has never used smokeless tobacco. She reports that she does not currently use alcohol. She reports that she does not currently use drugs. Margaret [name] is allergic to lithium, penicillins, and sulfa antibiotics.   HPI  Today, she is being contacted for a post-procedure assessment.   Post-Procedure Evaluation  Procedure(s):    Procedure:          Anesthesia, Analgesia, Anxiolysis:  Type: Hip bursa injection #1  Primary Purpose: Diagnostic Region: Upper (proximal) Femoral Region Level: Hip Joint Target Area: Trochanteric Bursa Approach: Anterior approach Laterality: Left  Type: Local Anesthesia Local  Anesthetic: Lidocaine 1-2% Sedation: None  Indication(s):  Analgesia Route: Infiltration ([place]/IM) IV Access: N/A   Position: Supine   Indications: 1. Chronic pain syndrome   2. History of left hip replacement (2017)   3. Trochanteric bursitis of  left hip    Pain Score: Pre-procedure: 7 /10 Post-procedure: 0-No pain/10     Anxiolysis: Please see nurses note.  Effectiveness during initial hour after procedure (Ultra-Short Term Relief): 20 %   Local anesthetic used: Long-acting (4-6 hours) Effectiveness: Defined as any analgesic benefit obtained secondary to the administration of local anesthetics. This carries significant diagnostic value as to the etiological location, or anatomical origin, of the pain. Duration of benefit is expected to coincide with the duration of the local anesthetic used.  Effectiveness during initial 4-6 hours after procedure (Short-Term Relief): 20 %   Long-term benefit: Defined as any relief past the pharmacologic duration of the local anesthetics.  Effectiveness past the initial 6 hours after procedure (Long-Term Relief): 75 %   Benefits, current: Defined as benefit present at the time of this evaluation.   Analgesia:  75% Function: Somewhat improved   Pharmacotherapy Assessment   Analgesic: Norco 10 mg TID prn   Monitoring: [place] PMP: PDMP reviewed during this encounter.       Pharmacotherapy: No side-effects or adverse reactions reported. Compliance: No problems identified. Effectiveness: Clinically acceptable. Plan: Refer to "POC". UDS:  Summary  Date Value Ref Range Status  05/27/2021 Note  Final    Comment:    ==================================================================== [employer] Select 13 (MW) ==================================================================== Test                             Result       Flag       Units  Drug Present and Declared for Prescription Verification   Hydrocodone                    3150          EXPECTED   ng/mg creat   Dihydrocodeine                 486          EXPECTED   ng/mg creat   Norhydrocodone                 2791         EXPECTED   ng/mg creat    Sources of hydrocodone include scheduled prescription medications.    Dihydrocodeine and norhydrocodone are expected metabolites of    hydrocodone. Dihydrocodeine is also available as a scheduled    prescription medication.  ==================================================================== Test                      Result    Flag   Units      Ref Range   Creatinine              22               mg/dL      >=20 ==================================================================== Declared Medications:  The flagging and interpretation on this report are based on the  following declared medications.  Unexpected results may arise from  inaccuracies in the declared medications.   **Note: The testing scope of this panel includes these medications:   Hydrocodone (Norco)   **Note: The testing scope of this panel does not include the  following reported medications:   Acetaminophen (Tylenol)  Acetaminophen (Norco)  Alendronate (Fosamax)  Amlodipine (Norvasc)  Aripiprazole (Abilify)  Benztropine (Cogentin)  Bisacodyl  Bisoprolol  Cetirizine (Zyrtec)  Cyclobenzaprine (Flexeril)  Dulaglutide (Trulicity)  Escitalopram (Lexapro)  Furosemide (Lasix)  Gabapentin (Neurontin)  Glimepiride (Amaryl)  Hydrochlorothiazide  Hydroxyzine (Vistaril)  Insulin (Lantus)  Iron  Lamotrigine (Lamictal)  Losartan (Cozaar)  Meclizine (Antivert)  Meloxicam (Mobic)  Metformin (Glucophage)  Pantoprazole (Protonix)  Spironolactone (Aldactone)  Tizanidine (Zanaflex)  Vitamin B  Vitamin C ==================================================================== For clinical consultation, please call [phone]. ====================================================================      Laboratory Chemistry Profile   Renal Lab Results   Component Value Date   BUN 11 12/06/2019   CREATININE 0.59 12/06/2019   BCR 19 12/06/2019   GFRAA 114 12/06/2019   GFRNONAA 99 12/06/2019    Hepatic Lab Results  Component Value Date   AST 48 (H) 12/06/2019   ALT 47 (H) 12/06/2019   ALBUMIN 3.7 (L) 12/06/2019   ALKPHOS 122 (H) 12/06/2019    Electrolytes Lab Results  Component Value Date   NA 137 12/06/2019   K 3.9 12/06/2019   CL 101 12/06/2019   CALCIUM 9.7 12/06/2019    Bone No results found for: VD25OH, VD125OH2TOT, FP8251GF8, MK1031YO1, 25OHVITD1, 25OHVITD2, 25OHVITD3, TESTOFREE, TESTOSTERONE  Inflammation (CRP: Acute Phase) (ESR: Chronic Phase) No results found for: CRP, ESRSEDRATE, LATICACIDVEN       Note: Above Lab results reviewed.   Assessment  The primary encounter diagnosis was Chronic pain syndrome. Diagnoses of History of left hip replacement (2017), Trochanteric bursitis of left hip, Bilateral primary osteoarthritis of knee, Neck pain on right side, Pain in joint of right shoulder, and Trochanteric bursitis of right hip were also pertinent to this visit.  Plan of Care   Patient states that her left hip is doing much better after her previous left hip bursa injection for left hip greater trochanteric bursitis.  She is now having increased right hip pain and is visibly tender to palpation overlying her right hip bursa.  She states that she is feeling the same quality of pain as she is feeling on the left side and is hoping to have a right hip bursa injection done.  We will plan on doing this next week.   Orders:  Orders Placed This Encounter  Procedures   HIP INJECTION    Standing Status:   Future    Standing Expiration Date:   01/06/2022    Scheduling Instructions:     Purpose: Therapeutic/Diagnostic     Indication: Hip pain 2ry to Trochanteric Burlitis unspecified side (M70.60).     Side: RIGHT     Sedation: without     Timeframe: As soon as the schedule permits.    Follow-up plan:   Return in about  1 week (around 10/15/2021) for R hip bursa injection, without sedation.     Interventional management options: Planned, scheduled, and/or pending:    Lumbar epidural steroid injection at L4-L5 without sedation Intra-articular knee Hyalgan series #1 done 08/28/20 helped significantly (100% pain relief for 4 months), repeat as needed Plan for left hip greater trochanteric bursa injection with local only.   Considering:   Lumbar epidural steroid injection, lumbar facet medial branch nerve block pending lumbar MRI   PRN Procedures:   Repeat bilateral intra-articular Hyalgan injection         Recent Visits Date Type Provider Dept  09/10/21 Procedure visit Gillis Santa, MD [facility]  09/02/21 Office Visit Gillis Santa, MD [facility]  Showing recent visits within past 90 days and meeting all other requirements Today's Visits Date Type Provider Dept  10/08/21 Office Visit Gillis Santa, MD [facility]  Showing today's visits and meeting all other requirements Future Appointments Date Type Provider Dept  11/27/21 Appointment Gillis Santa, MD [facility]  Showing future appointments within next 90 days and meeting all other requirements I discussed the assessment and treatment plan with the patient. The patient was provided an opportunity to ask questions and all were answered. The patient agreed with the plan and demonstrated an understanding of the instructions.  Patient advised to call back or seek an in-person evaluation if the symptoms or condition worsens.  Duration of encounter: 14mnutes.  Note by: BGillis Santa MD Date: 10/08/2021; Time: 11:58 AM

## 2021-10-13 ENCOUNTER — Encounter: Payer: Self-pay | Admitting: Student in an Organized Health Care Education/Training Program

## 2021-10-13 ENCOUNTER — Ambulatory Visit
Admission: RE | Admit: 2021-10-13 | Discharge: 2021-10-13 | Disposition: A | Discharge status: Home / Self Care | Payer: Medicaid Other | Source: Ambulatory Visit | Attending: Student in an Organized Health Care Education/Training Program | Admitting: Student in an Organized Health Care Education/Training Program

## 2021-10-13 ENCOUNTER — Ambulatory Visit (HOSPITAL_BASED_OUTPATIENT_CLINIC_OR_DEPARTMENT_OTHER): Payer: Medicaid Other | Admitting: Student in an Organized Health Care Education/Training Program

## 2021-10-13 ENCOUNTER — Other Ambulatory Visit: Payer: Self-pay

## 2021-10-13 DIAGNOSIS — M7061 Trochanteric bursitis, right hip: Secondary | ICD-10-CM | POA: Diagnosis not present

## 2021-10-13 MED ORDER — DEXAMETHASONE SODIUM PHOSPHATE 10 MG/ML IJ SOLN
10.0000 mg | Freq: Once | INTRAMUSCULAR | Status: AC
  Administered 2021-10-13: 10 mg
  Filled 2021-10-13: qty 1

## 2021-10-13 MED ORDER — LIDOCAINE HCL 2 % IJ SOLN
INTRAMUSCULAR | Status: AC
  Filled 2021-10-13: qty 10

## 2021-10-13 MED ORDER — ROPIVACAINE HCL 2 MG/ML IJ SOLN
INTRAMUSCULAR | Status: AC
  Filled 2021-10-13: qty 20

## 2021-10-13 MED ORDER — ROPIVACAINE HCL 2 MG/ML IJ SOLN
4.0000 mL | Freq: Once | INTRAMUSCULAR | Status: AC
  Administered 2021-10-13: 4 mL via INTRA_ARTICULAR

## 2021-10-13 MED ORDER — IOHEXOL 180 MG/ML  SOLN
10.0000 mL | Freq: Once | INTRAMUSCULAR | Status: AC
  Administered 2021-10-13: 10 mL via INTRA_ARTICULAR
  Filled 2021-10-13: qty 10

## 2021-10-13 NOTE — Progress Notes (Signed)
PROVIDER NOTE: Information contained herein reflects review and annotations entered in association with encounter. Interpretation of such information and data should be left to medically-trained personnel. Information provided to patient can be located elsewhere in the medical record under "Patient Instructions". Document created using STT-dictation technology, any transcriptional errors that may result from process are unintentional.    Patient: Margaret [name]  Service Category: Procedure Provider: Gillis Santa, MD DOB: [birth year] DOS: 10/13/2021 Location: [facility] MRN: [id number] Setting: Ambulatory - outpatient Referring Provider: Gillis Santa, MD Type: Established Patient Specialty: Interventional Pain Management PCP: Rusty Aus, MD  Primary Reason for Visit: Interventional Pain Management Treatment. CC: Hip Pain (Right )    Procedure:          Anesthesia, Analgesia, Anxiolysis:  Type: Hip bursa injection #1  Primary Purpose: Diagnostic Region: Upper (proximal) Femoral Region Level: Hip Joint Target Area: Trochanteric Bursa Approach: Anterior approach Laterality: Right  Type: Local Anesthesia Local Anesthetic: Lidocaine 1-2% Sedation: None  Indication(s):  Analgesia Route: Infiltration ([place]/IM) IV Access: N/A   Position: Lateral Decubitus with bad side up   Indications: 1. Trochanteric bursitis of right hip    Pain Score: Pre-procedure: 8 /10 Post-procedure: 6 /10     Pre-op H&P Assessment:  Margaret [name] is a 65 y.o. (year old), female patient, seen today for interventional treatment. She  has a past surgical history that includes Abdominal hysterectomy; Tonsillectomy; Cholecystectomy; Esophagogastroduodenoscopy (egd) with propofol (N/A, 06/23/2019); and Colonoscopy with propofol (N/A, 06/23/2019). Margaret [name] has a current medication list which includes the following prescription(s): acetaminophen, alendronate, amlodipine, aripiprazole, b-complex with  vitamin c, benztropine, bisacodyl, bisoprolol-hydrochlorothiazide, cetirizine, cyclobenzaprine, trulicity, escitalopram, furosemide, gabapentin, glimepiride, hydrocodone-acetaminophen, [START ON 11/05/2021] hydrocodone-acetaminophen, hydroxyzine, insulin glargine, lamotrigine, losartan, meclizine, meloxicam, metformin, pantoprazole, spironolactone, tizanidine, amlodipine, cefuroxime, and ferrous sulfate. Her primarily concern today is the Hip Pain (Right )  Initial Vital Signs:  Pulse/HCG Rate: 73ECG Heart Rate: 69 Temp: 97.6 F (36.4 C) Resp: 16 BP: (!) 123/49 SpO2: 100 %  BMI: Estimated body mass index is 39.06 kg/m as calculated from the following:   Height as of this encounter: 5' (1.524 m).   Weight as of this encounter: 200 lb (90.7 kg).  Risk Assessment: Allergies: Reviewed. She is allergic to lithium, penicillins, and sulfa antibiotics.  Allergy Precautions: None required Coagulopathies: Reviewed. None identified.  Blood-thinner therapy: None at this time Active Infection(s): Reviewed. None identified. Margaret [name] is afebrile  Site Confirmation: Margaret [name] was asked to confirm the procedure and laterality before marking the site Procedure checklist: Completed Consent: Before the procedure and under the influence of no sedative(s), amnesic(s), or anxiolytics, the patient was informed of the treatment options, risks and possible complications. To fulfill our ethical and legal obligations, as recommended by the American Medical Association's Code of Ethics, I have informed the patient of my clinical impression; the nature and purpose of the treatment or procedure; the risks, benefits, and possible complications of the intervention; the alternatives, including doing nothing; the risk(s) and benefit(s) of the alternative treatment(s) or procedure(s); and the risk(s) and benefit(s) of doing nothing. The patient was provided information about the general risks and possible complications  associated with the procedure. These may include, but are not limited to: failure to achieve desired goals, infection, bleeding, organ or nerve damage, allergic reactions, paralysis, and death. In addition, the patient was informed of those risks and complications associated to the procedure, such as failure to decrease pain; infection; bleeding; organ or nerve damage with subsequent damage  to sensory, motor, and/or autonomic systems, resulting in permanent pain, numbness, and/or weakness of one or several areas of the body; allergic reactions; (i.e.: anaphylactic reaction); and/or death. Furthermore, the patient was informed of those risks and complications associated with the medications. These include, but are not limited to: allergic reactions (i.e.: anaphylactic or anaphylactoid reaction(s)); adrenal axis suppression; blood sugar elevation that in diabetics may result in ketoacidosis or comma; water retention that in patients with history of congestive heart failure may result in shortness of breath, pulmonary edema, and decompensation with resultant heart failure; weight gain; swelling or edema; medication-induced neural toxicity; particulate matter embolism and blood vessel occlusion with resultant organ, and/or nervous system infarction; and/or aseptic necrosis of one or more joints. Finally, the patient was informed that Medicine is not an exact science; therefore, there is also the possibility of unforeseen or unpredictable risks and/or possible complications that may result in a catastrophic outcome. The patient indicated having understood very clearly. We have given the patient no guarantees and we have made no promises. Enough time was given to the patient to ask questions, all of which were answered to the patient's satisfaction. Margaret [name] has indicated that she wanted to continue with the procedure. Attestation: I, the ordering provider, attest that I have discussed with the patient the  benefits, risks, side-effects, alternatives, likelihood of achieving goals, and potential problems during recovery for the procedure that I have provided informed consent. Date  Time: 10/13/2021  9:43 AM  Pre-Procedure Preparation:  Monitoring: As per clinic protocol. Respiration, ETCO2, SpO2, BP, heart rate and rhythm monitor placed and checked for adequate function Safety Precautions: Patient was assessed for positional comfort and pressure points before starting the procedure. Time-out: I initiated and conducted the "Time-out" before starting the procedure, as per protocol. The patient was asked to participate by confirming the accuracy of the "Time Out" information. Verification of the correct person, site, and procedure were performed and confirmed by me, the nursing staff, and the patient. "Time-out" conducted as per Joint Commission's Universal Protocol (UP.01.01.01). Time: 1013  Description of Procedure:          Area Prepped: Entire Posterolateral hip area. DuraPrep (Iodine Povacrylex [0.7% available iodine] and Isopropyl Alcohol, 74% w/w) Safety Precautions: Aspiration looking for blood return was conducted prior to all injections. At no point did we inject any substances, as a needle was being advanced. No attempts were made at seeking any paresthesias. Safe injection practices and needle disposal techniques used. Medications properly checked for expiration dates. SDV (single dose vial) medications used. Description of the Procedure: Protocol guidelines were followed. The patient was placed in position over the procedure table. The target area was identified and the area prepped in the usual manner. Skin & deeper tissues infiltrated with local anesthetic. Appropriate amount of time allowed to pass for local anesthetics to take effect. The procedure needles were then advanced to the target area. Proper needle placement secured. Negative aspiration confirmed. Solution injected in intermittent  fashion, asking for systemic symptoms every 0.5cc of injectate. The needles were then removed and the area cleansed, making sure to leave some of the prepping solution back to take advantage of its long term bactericidal properties. Vitals:   10/13/21 0944 10/13/21 1013 10/13/21 1017  BP: (!) 123/49 (!) 120/45 (!) 115/40  Pulse: 73    Resp: 16 18 16   Temp: 97.6 F (36.4 C)    TempSrc: Temporal    SpO2: 100% 99% 100%  Weight: 200 lb (90.7 kg)  Height: 5' (1.524 m)      Start Time: 1013 hrs. End Time: 1016 hrs.           Materials:  Needle(s) Type: Spinal Needle Gauge: 22G Length: 3.5-in Medication(s): Please see orders for medications and dosing details. 5 cc solution made of 4 cc of 0.2% ropivacaine, 1 cc of Decadron 10 mg/cc.  Imaging Guidance (Non-Spinal):          Type of Imaging Technique: Fluoroscopy Guidance (Non-Spinal) Indication(s): Assistance in needle guidance and placement for procedures requiring needle placement in or near specific anatomical locations not easily accessible without such assistance. Exposure Time: Please see nurses notes. Contrast: Before injecting any contrast, we confirmed that the patient did not have an allergy to iodine, shellfish, or radiological contrast. Once satisfactory needle placement was completed at the desired level, radiological contrast was injected. Contrast injected under live fluoroscopy. No contrast complications. See chart for type and volume of contrast used. Fluoroscopic Guidance: I was personally present during the use of fluoroscopy. "Tunnel Vision Technique" used to obtain the best possible view of the target area. Parallax error corrected before commencing the procedure. "Direction-depth-direction" technique used to introduce the needle under continuous pulsed fluoroscopy. Once target was reached, antero-posterior, oblique, and lateral fluoroscopic projection used confirm needle placement in all planes. Images permanently  stored in EMR. Interpretation: I personally interpreted the imaging intraoperatively. Adequate needle placement confirmed in multiple planes. Appropriate spread of contrast into desired area was observed. No evidence of afferent or efferent intravascular uptake. Permanent images saved into the patient's record.  Post-operative Assessment:  Post-procedure Vital Signs:  Pulse/HCG Rate: 7370 Temp:  97.6 F (36.4 C) Resp: 16 BP:  (!) 115/40 SpO2: 100 %  EBL: None  Complications: No immediate post-treatment complications observed by team, or reported by patient.  Note: The patient tolerated the entire procedure well. A repeat set of vitals were taken after the procedure and the patient was kept under observation following institutional policy, for this type of procedure. Post-procedural neurological assessment was performed, showing return to baseline, prior to discharge. The patient was provided with post-procedure discharge instructions, including a section on how to identify potential problems. Should any problems arise concerning this procedure, the patient was given instructions to immediately contact us, at any time, without hesitation. In any case, we plan to contact the patient by telephone for a follow-up status report regarding this interventional procedure.  Comments:  No additional relevant information.  Plan of Care  Orders:  Orders Placed This Encounter  Procedures   DG PAIN [facility] C-ARM 1-60 MIN NO REPORT    Intraoperative interpretation by procedural physician at [place].    Standing Status:   Standing    Number of Occurrences:   1    Order Specific Question:   Reason for exam:    Answer:   Assistance in needle guidance and placement for procedures requiring needle placement in or near specific anatomical locations not easily accessible without such assistance.    Chronic Opioid Analgesic:  Norco 10 mg TID prn   Medications ordered for procedure: Meds  ordered this encounter  Medications   ropivacaine (PF) 2 mg/mL (0.2%) (NAROPIN) injection 4 mL   dexamethasone (DECADRON) injection 10 mg   iohexol (OMNIPAQUE) 180 MG/ML injection 10 mL    Must be Myelogram-compatible. If not available, you may substitute with a water-soluble, non-ionic, hypoallergenic, myelogram-compatible radiological contrast medium.    Medications administered: We administered ropivacaine (PF) 2 mg/mL (0.2%), dexamethasone, and  iohexol.  See the medical record for exact dosing, route, and time of administration.  Follow-up plan:   Return for Keep sch. appt.     Interventional management options: Planned, scheduled, and/or pending:    Lumbar epidural steroid injection at L4-L5 without sedation Intra-articular knee Hyalgan series #1 done 08/28/20 helped significantly (100% pain relief for 4 months), repeat as needed Plan for left hip greater trochanteric bursa injection with local only.   Considering:   Lumbar epidural steroid injection, lumbar facet medial branch nerve block pending lumbar MRI   PRN Procedures:   Repeat bilateral intra-articular Hyalgan injection        Recent Visits Date Type Provider Dept  10/08/21 Office Visit Gillis Santa, MD [facility]  09/10/21 Procedure visit Gillis Santa, MD [facility]  09/02/21 Office Visit Gillis Santa, MD [facility]  Showing recent visits within past 90 days and meeting all other requirements Today's Visits Date Type Provider Dept  10/13/21 Procedure visit Gillis Santa, MD [facility]  Showing today's visits and meeting all other requirements Future Appointments Date Type Provider Dept  11/27/21 Appointment Gillis Santa, MD [facility]  Showing future appointments within next 90 days and meeting all other requirements Disposition: Discharge home  Discharge (Date  Time): 10/13/2021; 1030 hrs.   Primary Care Physician: Rusty Aus, MD Location: [facility] Note by: Gillis Santa, MD Date: 10/13/2021; Time: 10:29 AM  Disclaimer:  Medicine is not an exact science. The only guarantee in medicine is that nothing is guaranteed. It is important to note that the decision to proceed with this intervention was based on the information collected from the patient. The Data and conclusions were drawn from the patient's questionnaire, the interview, and the physical examination. Because the information was provided in large part by the patient, it cannot be guaranteed that it has not been purposely or unconsciously manipulated. Every effort has been made to obtain as much relevant data as possible for this evaluation. It is important to note that the conclusions that lead to this procedure are derived in large part from the available data. Always take into account that the treatment will also be dependent on availability of resources and existing treatment guidelines, considered by other Pain Management Practitioners as being common knowledge and practice, at the time of the intervention. For Medico-Legal purposes, it is also important to point out that variation in procedural techniques and pharmacological choices are the acceptable norm. The indications, contraindications, technique, and results of the above procedure should only be interpreted and judged by a Board-Certified Interventional Pain Specialist with extensive familiarity and expertise in the same exact procedure and technique.

## 2021-10-13 NOTE — Progress Notes (Signed)
Safety precautions to be maintained throughout the outpatient stay will include: orient to surroundings, keep bed in low position, maintain call bell within reach at all times, provide assistance with transfer out of bed and ambulation.  

## 2021-10-13 NOTE — Patient Instructions (Signed)

## 2021-10-14 ENCOUNTER — Telehealth: Payer: Self-pay | Admitting: *Deleted

## 2021-10-14 NOTE — Telephone Encounter (Signed)
No problems post procedure. 

## 2021-11-12 ENCOUNTER — Telehealth: Payer: Self-pay

## 2021-11-12 DIAGNOSIS — F3176 Bipolar disorder, in full remission, most recent episode depressed: Secondary | ICD-10-CM

## 2021-11-12 MED ORDER — ARIPIPRAZOLE 5 MG PO TABS: 5.0000 mg | ORAL_TABLET | Freq: Every day | ORAL | 0 refills | Status: DC

## 2021-11-12 NOTE — Telephone Encounter (Signed)
Returned call to patient.  She reports she feels slowed down and does not feel depressed, cannot give a reason for the slowing.  Patient advised to come into the office for an evaluation tomorrow morning at 8:30 AM-however reports she cannot make it since she lives far.  We will reduce Abilify to 5 mg to see if that will help her.  Patient advised to give it a week and call the clinic back if her symptoms does not get better or gets worse for a sooner evaluation for further changes.

## 2021-11-12 NOTE — Telephone Encounter (Signed)
pt called left a message that she wanted to speak with you that she feels like she is supper slow. She wanted to know if it the medication doing it.

## 2021-11-27 ENCOUNTER — Encounter: Payer: Self-pay | Admitting: Student in an Organized Health Care Education/Training Program

## 2021-11-27 ENCOUNTER — Other Ambulatory Visit: Payer: Self-pay

## 2021-11-27 ENCOUNTER — Ambulatory Visit
Payer: Medicaid Other | Attending: Student in an Organized Health Care Education/Training Program | Admitting: Student in an Organized Health Care Education/Training Program

## 2021-11-27 VITALS — BP 135/45 | HR 69 | Temp 98.4°F | Resp 16 | Ht 60.0 in | Wt 200.0 lb

## 2021-11-27 DIAGNOSIS — G894 Chronic pain syndrome: Secondary | ICD-10-CM

## 2021-11-27 DIAGNOSIS — M25511 Pain in right shoulder: Secondary | ICD-10-CM

## 2021-11-27 DIAGNOSIS — Z79891 Long term (current) use of opiate analgesic: Secondary | ICD-10-CM

## 2021-11-27 DIAGNOSIS — M5412 Radiculopathy, cervical region: Secondary | ICD-10-CM | POA: Diagnosis present

## 2021-11-27 DIAGNOSIS — Z96642 Presence of left artificial hip joint: Secondary | ICD-10-CM

## 2021-11-27 DIAGNOSIS — G5702 Lesion of sciatic nerve, left lower limb: Secondary | ICD-10-CM

## 2021-11-27 DIAGNOSIS — M7061 Trochanteric bursitis, right hip: Secondary | ICD-10-CM | POA: Diagnosis present

## 2021-11-27 DIAGNOSIS — G8929 Other chronic pain: Secondary | ICD-10-CM

## 2021-11-27 DIAGNOSIS — M542 Cervicalgia: Secondary | ICD-10-CM | POA: Diagnosis present

## 2021-11-27 DIAGNOSIS — M533 Sacrococcygeal disorders, not elsewhere classified: Secondary | ICD-10-CM | POA: Diagnosis present

## 2021-11-27 DIAGNOSIS — M47816 Spondylosis without myelopathy or radiculopathy, lumbar region: Secondary | ICD-10-CM | POA: Diagnosis present

## 2021-11-27 DIAGNOSIS — M170 Bilateral primary osteoarthritis of knee: Secondary | ICD-10-CM | POA: Diagnosis present

## 2021-11-27 DIAGNOSIS — M7062 Trochanteric bursitis, left hip: Secondary | ICD-10-CM | POA: Diagnosis present

## 2021-11-27 MED ORDER — HYDROCODONE-ACETAMINOPHEN 10-325 MG PO TABS: 1.0000 | ORAL_TABLET | Freq: Three times a day (TID) | ORAL | 0 refills | Status: AC | PRN

## 2021-11-27 MED ORDER — HYDROCODONE-ACETAMINOPHEN 10-325 MG PO TABS: 1.0000 | ORAL_TABLET | Freq: Three times a day (TID) | ORAL | 0 refills | Status: DC | PRN

## 2021-11-27 NOTE — Patient Instructions (Signed)
GENERAL RISKS AND COMPLICATIONS ° °What are the risk, side effects and possible complications? °Generally speaking, most procedures are safe.  However, with any procedure there are risks, side effects, and the possibility of complications.  The risks and complications are dependent upon the sites that are lesioned, or the type of nerve block to be performed.  The closer the procedure is to the spine, the more serious the risks are.  Great care is taken when placing the radio frequency needles, block needles or lesioning probes, but sometimes complications can occur. °Infection: Any time there is an injection through the skin, there is a risk of infection.  This is why sterile conditions are used for these blocks.  There are four possible types of infection. °Localized skin infection. °Central Nervous System Infection-This can be in the form of Meningitis, which can be deadly. °Epidural Infections-This can be in the form of an epidural abscess, which can cause pressure inside of the spine, causing compression of the spinal cord with subsequent paralysis. This would require an emergency surgery to decompress, and there are no guarantees that the patient would recover from the paralysis. °Discitis-This is an infection of the intervertebral discs.  It occurs in about 1% of discography procedures.  It is difficult to treat and it may lead to surgery. ° °      2. Pain: the needles have to go through skin and soft tissues, will cause soreness. °      3. Damage to internal structures:  The nerves to be lesioned may be near blood vessels or   ° other nerves which can be potentially damaged. °      4. Bleeding: Bleeding is more common if the patient is taking blood thinners such as  aspirin, Coumadin, Ticiid, Plavix, etc., or if he/she have some genetic predisposition  such as hemophilia. Bleeding into the spinal canal can cause compression of the spinal  cord with subsequent paralysis.  This would require an emergency  surgery to  decompress and there are no guarantees that the patient would recover from the  paralysis. °      5. Pneumothorax:  Puncturing of a lung is a possibility, every time a needle is introduced in  the area of the chest or upper back.  Pneumothorax refers to free air around the  collapsed lung(s), inside of the thoracic cavity (chest cavity).  Another two possible  complications related to a similar event would include: Hemothorax and Chylothorax.   These are variations of the Pneumothorax, where instead of air around the collapsed  lung(s), you may have blood or chyle, respectively. °      6. Spinal headaches: They may occur with any procedures in the area of the spine. °      7. Persistent CSF (Cerebro-Spinal Fluid) leakage: This is a rare problem, but may occur  with prolonged intrathecal or epidural catheters either due to the formation of a fistulous  track or a dural tear. °      8. Nerve damage: By working so close to the spinal cord, there is always a possibility of  nerve damage, which could be as serious as a permanent spinal cord injury with  paralysis. °      9. Death:  Although rare, severe deadly allergic reactions known as "Anaphylactic  reaction" can occur to any of the medications used. °     10. Worsening of the symptoms:  We can always make thing worse. ° °What are the chances   of something like this happening? °Chances of any of this occuring are extremely low.  By statistics, you have more of a chance of getting killed in a motor vehicle accident: while driving to the hospital than any of the above occurring .  Nevertheless, you should be aware that they are possibilities.  In general, it is similar to taking a shower.  Everybody knows that you can slip, hit your head and get killed.  Does that mean that you should not shower again?  Nevertheless always keep in mind that statistics do not mean anything if you happen to be on the wrong side of them.  Even if a procedure has a 1 (one) in a  1,000,000 (million) chance of going wrong, it you happen to be that one..Also, keep in mind that by statistics, you have more of a chance of having something go wrong when taking medications. ° °Who should not have this procedure? °If you are on a blood thinning medication (e.g. Coumadin, Plavix, see list of "Blood Thinners"), or if you have an active infection going on, you should not have the procedure.  If you are taking any blood thinners, please inform your physician. ° °How should I prepare for this procedure? °Do not eat or drink anything at least six hours prior to the procedure. °Bring a driver with you .  It cannot be a taxi. °Come accompanied by an adult that can drive you back, and that is strong enough to help you if your legs get weak or numb from the local anesthetic. °Take all of your medicines the morning of the procedure with just enough water to swallow them. °If you have diabetes, make sure that you are scheduled to have your procedure done first thing in the morning, whenever possible. °If you have diabetes, take only half of your insulin dose and notify our nurse that you have done so as soon as you arrive at the clinic. °If you are diabetic, but only take blood sugar pills (oral hypoglycemic), then do not take them on the morning of your procedure.  You may take them after you have had the procedure. °Do not take aspirin or any aspirin-containing medications, at least eleven (11) days prior to the procedure.  They may prolong bleeding. °Wear loose fitting clothing that may be easy to take off and that you would not mind if it got stained with Betadine or blood. °Do not wear any jewelry or perfume °Remove any nail coloring.  It will interfere with some of our monitoring equipment. ° °NOTE: Remember that this is not meant to be interpreted as a complete list of all possible complications.  Unforeseen problems may occur. ° °BLOOD THINNERS °The following drugs contain aspirin or other products,  which can cause increased bleeding during surgery and should not be taken for 2 weeks prior to and 1 week after surgery.  If you should need take something for relief of minor pain, you may take acetaminophen which is found in Tylenol,m Datril, Anacin-3 and Panadol. It is not blood thinner. The products listed below are.  Do not take any of the products listed below in addition to any listed on your instruction sheet. ° °A.P.C or A.P.C with Codeine Codeine Phosphate Capsules #3 Ibuprofen Ridaura  °ABC compound Congesprin Imuran rimadil  °Advil Cope Indocin Robaxisal  °Alka-Seltzer Effervescent Pain Reliever and Antacid Coricidin or Coricidin-D ° Indomethacin Rufen  °Alka-Seltzer plus Cold Medicine Cosprin Ketoprofen S-A-C Tablets  °Anacin Analgesic Tablets or Capsules Coumadin   Korlgesic Salflex  Anacin Extra Strength Analgesic tablets or capsules CP-2 Tablets Lanoril Salicylate  Anaprox Cuprimine Capsules Levenox Salocol  Anexsia-D Dalteparin Magan Salsalate  Anodynos Darvon compound Magnesium Salicylate Sine-off  Ansaid Dasin Capsules Magsal Sodium Salicylate  Anturane Depen Capsules Marnal Soma  APF Arthritis pain formula Dewitt's Pills Measurin Stanback  Argesic Dia-Gesic Meclofenamic Sulfinpyrazone  Arthritis Bayer Timed Release Aspirin Diclofenac Meclomen Sulindac  Arthritis pain formula Anacin Dicumarol Medipren Supac  Analgesic (Safety coated) Arthralgen Diffunasal Mefanamic Suprofen  Arthritis Strength Bufferin Dihydrocodeine Mepro Compound Suprol  Arthropan liquid Dopirydamole Methcarbomol with Aspirin Synalgos  ASA tablets/Enseals Disalcid Micrainin Tagament  Ascriptin Doan's Midol Talwin  Ascriptin A/D Dolene Mobidin Tanderil  Ascriptin Extra Strength Dolobid Moblgesic Ticlid  Ascriptin with Codeine Doloprin or Doloprin with Codeine Momentum Tolectin  Asperbuf Duoprin Mono-gesic Trendar  Aspergum Duradyne Motrin or Motrin IB Triminicin  Aspirin plain, buffered or enteric coated  Durasal Myochrisine Trigesic  Aspirin Suppositories Easprin Nalfon Trillsate  Aspirin with Codeine Ecotrin Regular or Extra Strength Naprosyn Uracel  Atromid-S Efficin Naproxen Ursinus  Auranofin Capsules Elmiron Neocylate Vanquish  Axotal Emagrin Norgesic Verin  Azathioprine Empirin or Empirin with Codeine Normiflo Vitamin E  Azolid Emprazil Nuprin Voltaren  Bayer Aspirin plain, buffered or children's or timed BC Tablets or powders Encaprin Orgaran Warfarin Sodium  Buff-a-Comp Enoxaparin Orudis Zorpin  Buff-a-Comp with Codeine Equegesic Os-Cal-Gesic   Buffaprin Excedrin plain, buffered or Extra Strength Oxalid   Bufferin Arthritis Strength Feldene Oxphenbutazone   Bufferin plain or Extra Strength Feldene Capsules Oxycodone with Aspirin   Bufferin with Codeine Fenoprofen Fenoprofen Pabalate or Pabalate-SF   Buffets II Flogesic Panagesic   Buffinol plain or Extra Strength Florinal or Florinal with Codeine Panwarfarin   Buf-Tabs Flurbiprofen Penicillamine   Butalbital Compound Four-way cold tablets Penicillin   Butazolidin Fragmin Pepto-Bismol   Carbenicillin Geminisyn Percodan   Carna Arthritis Reliever Geopen Persantine   Carprofen Gold's salt Persistin   Chloramphenicol Goody's Phenylbutazone   Chloromycetin Haltrain Piroxlcam   Clmetidine heparin Plaquenil   Cllnoril Hyco-pap Ponstel   Clofibrate Hydroxy chloroquine Propoxyphen         Before stopping any of these medications, be sure to consult the physician who ordered them.  Some, such as Coumadin (Warfarin) are ordered to prevent or treat serious conditions such as "deep thrombosis", "pumonary embolisms", and other heart problems.  The amount of time that you may need off of the medication may also vary with the medication and the reason for which you were taking it.  If you are taking any of these medications, please make sure you notify your pain physician before you undergo any procedures.         Trigger Point  Injections Patient Information  Description: Trigger points are areas of muscle sensitive to touch which cause pain with movement, sometimes felt some distance from the site of palpation.  Usually the muscle containing these trigger points if felt as a tight band or knot.   The area of maximum tenderness or trigger point is identified, and after antiseptic preparation of the skin, a small needle is placed into this site.  Reproduction of the pain often occurs and numbing medicine (local anesthetic) is injected into the site, sometimes along with steroid preparation.  The entire block usually lasts less than 5 minutes.  Conditions which may be treated by trigger points:  Muscular pain and spasm Nerve irritation  Preparation for the injection:  Do not eat any solid food or dairy products within 8 hours   of your appointment. You may drink clear liquids up to 3 hours before appointment.  Clear liquids include water, black coffee, juice or soda.  No milk or cream please. You may take your regular medications, including pain medications, with a sip of water before your appointment.  Diabetics should hold regular insulin ( if take separately) and take 1/2 normal NPH dose the morning of the procedure.  Carry some sugar containing items with you to your appointment. A driver must accompany you and be prepared to drive you home after your procedure.  Bring all your current medications with you. An IV may be inserted and sedation may be given at the discretion of the physician.  A blood pressure cuff, EKG, and other monitors will often be applied during the procedure.  Some patients may need to have extra oxygen administered for a short period. You will be asked to provide medical information, including your allergies and medications, prior to the procedure.  We must know immediately if you are taking blood thinners (like Coumadin/Warfarin) or if you are allergic to IV iodine contrast (dye).  We must know if  you could possibly be pregnant.  Possible side-effects:  Bleeding from needle site Infection (rare, may require surgery) Nerve injury (rare) Numbness & tingling (temporary) Punctured lung (if injection around chest) Light-headedness (temporary) Pain at injection site (several days) Decreased blood pressure (rare, temporary) Weakness in arm/leg (temporary)  Call if you experience:  Hive or difficulty breathing (go to the emergency room) Inflammation or drainage at the injection site(s)  Please note:  Although the local anesthetic injected can often make your painful muscle feel good for several hours after the injection, the pain may return.  It takes 3-7 days for steroids to work.  You may not notice any pain relief for at least one week.  If effective, we will often do a series of injections spaced 3-6 weeks apart to maximally decrease your pain.  If you have any questions please call [phone] [facility] In[facility] Patient Information  Description: The sacroiliac joint connects the scrum (very low back and tailbone) to the ilium (a pelvic bone which also forms half of the hip joint).  Normally this joint experiences very little motion.  When this joint becomes inflamed or unstable low back and or hip and pelvis pain may result.  Injection of this joint with local anesthetics (numbing medicines) and steroids can provide diagnostic information and reduce pain.  This injection is performed with the aid of x-ray guidance into the tailbone area while you are lying on your stomach.   You may experience an electrical sensation down the leg while this is being done.  You may also experience numbness.  We also may ask if we are reproducing your normal pain during the injection.  Conditions which may be treated SI injection:  Low back, buttock, hip or leg pain  Preparation for the Injection:  Do not eat any solid food or dairy  products within 8 hours of your appointment.  You may drink clear liquids up to 3 hours before appointment.  Clear liquids include water, black coffee, juice or soda.  No milk or cream please. You may take your regular medications, including pain medications with a sip of water before your appointment.  Diabetics should hold regular insulin (if take separately) and take 1/2 normal NPH dose the morning of the procedure.  Carry some sugar containing items with you to your appointment. A   driver must accompany you and be prepared to drive you home after your procedure. Bring all of your current medications with you. An IV may be inserted and sedation may be given at the discretion of the physician. A blood pressure cuff, EKG and other monitors will often be applied during the procedure.  Some patients may need to have extra oxygen administered for a short period.  You will be asked to provide medical information, including your allergies, prior to the procedure.  We must know immediately if you are taking blood thinners (like Coumadin/Warfarin) or if you are allergic to IV iodine contrast (dye).  We must know if you could possible be pregnant.  Possible side effects:  Bleeding from needle site Infection (rare, may require surgery) Nerve injury (rare) Numbness & tingling (temporary) A brief convulsion or seizure Light-headedness (temporary) Pain at injection site (several days) Decreased blood pressure (temporary) Weakness in the leg (temporary)   Call if you experience:  New onset weakness or numbness of an extremity below the injection site that last more than 8 hours. Hives or difficulty breathing ( go to the emergency room) Inflammation or drainage at the injection site Any new symptoms which are concerning to you  Please note:  Although the local anesthetic injected can often make your back/ hip/ buttock/ leg feel good for several hours after the injections, the pain will likely  return.  It takes 3-7 days for steroids to work in the sacroiliac area.  You may not notice any pain relief for at least that one week.  If effective, we will often do a series of three injections spaced 3-6 weeks apart to maximally decrease your pain.  After the initial series, we generally will wait some months before a repeat injection of the same type.  If you have any questions, please call [phone] [facility]   

## 2021-11-27 NOTE — Progress Notes (Signed)
PROVIDER NOTE: Information contained herein reflects review and annotations entered in association with encounter. Interpretation of such information and data should be left to medically-trained personnel. Information provided to patient can be located elsewhere in the medical record under "Patient Instructions". Document created using STT-dictation technology, any transcriptional errors that may result from process are unintentional.    Patient: Margaret [name]  Service Category: E/M  Provider: Gillis Santa, MD  DOB: [birth year]  DOS: 11/27/2021  Specialty: Interventional Pain Management  MRN: [id number]  Setting: Ambulatory outpatient  PCP: Rusty Aus, MD  Type: Established Patient    Referring Provider: Rusty Aus, MD  Location: Office  Delivery: Face-to-face     HPI  Margaret [name], a 66 y.o. year old female, is here today because of her Chronic left SI joint pain [M53.3, G89.29]. Margaret [name] primary complain today is Hip Pain (bilat) and Leg Pain (bilat) Last encounter: My last encounter with her was on 10/13/2021. Pertinent problems: Margaret [name] has Chronic pain syndrome; Encounter for long-term opiate analgesic use; Bilateral primary osteoarthritis of knee; SI (sacroiliac) joint dysfunction; History of left hip replacement; Recurrent major depressive episodes, moderate ([place]); Cervical radicular pain; Pain management contract signed; Lumbar facet arthropathy; Pain in joint of right shoulder; and Cervical facet joint syndrome on their pertinent problem list. Pain Assessment: Severity of Chronic pain is reported as a 7 /10. Location: Hip Right, Left/denies. Onset: More than a month ago. Quality: Sharp. Timing: Constant. Modifying factor(s): meds, sitting, reclining. Vitals:  height is 5' (1.524 m) and weight is 200 lb (90.7 kg). Her temporal temperature is 98.4 F (36.9 C). Her blood pressure is 135/45 (abnormal) and her pulse is 69. Her respiration is 16 and oxygen saturation is 98%.    Reason for encounter: medication management.   Margaret [name] presents today for medication management as well as increased left hip and left buttock pain.  She states that she was evaluated by orthopedic surgery and they did not recommend any surgical intervention.  She does have a history of left hip surgery.  She has a leg length discrepancy.  She has fairly severe left buttock pain that is overlying her piriformis and sacroiliac joints and is tender to palpation.  Positive physical exam findings as below.  Discussed diagnostic left sacroiliac joint and piriformis injection under fluoroscopy.  Discussed opioid tolerance and drug holiday with patient.  Refill Norco as below.  Pharmacotherapy Assessment  Analgesic: Norco 10 mg TID prn   Monitoring: [place] PMP: PDMP not reviewed this encounter.       Pharmacotherapy: No side-effects or adverse reactions reported. Compliance: No problems identified. Effectiveness: Clinically acceptable.  Margaret Patience, Margaret [name]  11/27/2021  9:30 AM  Sign when Signing Visit Nursing Pain Medication Assessment:  Safety precautions to be maintained throughout the outpatient stay will include: orient to surroundings, keep bed in low position, maintain call bell within reach at all times, provide assistance with transfer out of bed and ambulation.  Medication Inspection Compliance: Pill count conducted under aseptic conditions, in front of the patient. Neither the pills nor the bottle was removed from the patient's sight at any time. Once count was completed pills were immediately returned to the patient in their original bottle.  Medication: Hydrocodone/APAP Pill/Patch Count:  11 of 90 pills remain Pill/Patch Appearance: Markings consistent with prescribed medication Bottle Appearance: Standard pharmacy container. Clearly labeled. Filled Date: 01 / 04 / 2023 Last Medication intake:  Today  Pt states she has 6 days worth of hydrocodone/APAP at  home in pill counter.   UDS:   Summary  Date Value Ref Range Status  05/27/2021 Note  Final    Comment:    ==================================================================== [employer] (MW) ==================================================================== Test                             Result       Flag       Units  Drug Present and Declared for Prescription Verification   Hydrocodone                    3150         EXPECTED   ng/mg creat   Dihydrocodeine                 486          EXPECTED   ng/mg creat   Norhydrocodone                 2791         EXPECTED   ng/mg creat    Sources of hydrocodone include scheduled prescription medications.    Dihydrocodeine and norhydrocodone are expected metabolites of    hydrocodone. Dihydrocodeine is also available as a scheduled    prescription medication.  ==================================================================== Test                      Result    Flag   Units      Ref Range   Creatinine              22               mg/dL      >=20 ==================================================================== Declared Medications:  The flagging and interpretation on this report are based on the  following declared medications.  Unexpected results may arise from  inaccuracies in the declared medications.   **Note: The testing scope of this panel includes these medications:   Hydrocodone (Norco)   **Note: The testing scope of this panel does not include the  following reported medications:   Acetaminophen (Tylenol)  Acetaminophen (Norco)  Alendronate (Fosamax)  Amlodipine (Norvasc)  Aripiprazole (Abilify)  Benztropine (Cogentin)  Bisacodyl  Bisoprolol  Cetirizine (Zyrtec)  Cyclobenzaprine (Flexeril)  Dulaglutide (Trulicity)  Escitalopram (Lexapro)  Furosemide (Lasix)  Gabapentin (Neurontin)  Glimepiride (Amaryl)  Hydrochlorothiazide  Hydroxyzine (Vistaril)  Insulin (Lantus)  Iron  Lamotrigine (Lamictal)  Losartan (Cozaar)  Meclizine  (Antivert)  Meloxicam (Mobic)  Metformin (Glucophage)  Pantoprazole (Protonix)  Spironolactone (Aldactone)  Tizanidine (Zanaflex)  Vitamin B  Vitamin C ==================================================================== For clinical consultation, please call [phone]. ====================================================================      ROS  Constitutional: Denies any fever or chills Gastrointestinal: No reported hemesis, hematochezia, vomiting, or acute GI distress Musculoskeletal:  Left buttock, left SI joint pain Neurological: No reported episodes of acute onset apraxia, aphasia, dysarthria, agnosia, amnesia, paralysis, loss of coordination, or loss of consciousness  Medication Review  ARIPiprazole, B-complex with vitamin C, Dulaglutide, HYDROcodone-acetaminophen, acetaminophen, alendronate, amLODipine, benztropine, bisacodyl, bisoprolol-hydrochlorothiazide, cetirizine, cyclobenzaprine, escitalopram, furosemide, glimepiride, hydrOXYzine, insulin glargine, lamoTRIgine, losartan, meclizine, meloxicam, metFORMIN, pantoprazole, spironolactone, and tiZANidine  History Review  Allergy: Margaret [name] is allergic to lithium, penicillins, and sulfa antibiotics. Drug: Margaret [name]  reports that she does not currently use drugs. Alcohol:  reports that she does not currently use alcohol. Tobacco:  reports that she quit smoking about 10 years ago. Her smoking use included cigarettes. She has  never used smokeless tobacco. Social: Margaret [name]  reports that she quit smoking about 10 years ago. Her smoking use included cigarettes. She has never used smokeless tobacco. She reports that she does not currently use alcohol. She reports that she does not currently use drugs. Medical:  has a past medical history of Bipolar 1 disorder ([place]), Diabetes mellitus without complication ([place]), and Hypertension. Surgical: Margaret [name]  has a past surgical history that includes Abdominal hysterectomy;  Tonsillectomy; Cholecystectomy; Esophagogastroduodenoscopy (egd) with propofol (N/A, 06/23/2019); and Colonoscopy with propofol (N/A, 06/23/2019). Family: family history includes Bipolar disorder in her brother, paternal uncle, and paternal uncle; Mental illness in her father.  Laboratory Chemistry Profile   Renal Lab Results  Component Value Date   BUN 11 12/06/2019   CREATININE 0.59 12/06/2019   BCR 19 12/06/2019   GFRAA 114 12/06/2019   GFRNONAA 99 12/06/2019    Hepatic Lab Results  Component Value Date   AST 48 (H) 12/06/2019   ALT 47 (H) 12/06/2019   ALBUMIN 3.7 (L) 12/06/2019   ALKPHOS 122 (H) 12/06/2019    Electrolytes Lab Results  Component Value Date   NA 137 12/06/2019   K 3.9 12/06/2019   CL 101 12/06/2019   CALCIUM 9.7 12/06/2019    Bone No results found for: VD25OH, VD125OH2TOT, HK7425ZD6, LO7564PP2, 25OHVITD1, 25OHVITD2, 25OHVITD3, TESTOFREE, TESTOSTERONE  Inflammation (CRP: Acute Phase) (ESR: Chronic Phase) No results found for: CRP, ESRSEDRATE, LATICACIDVEN       Note: Above Lab results reviewed.   Physical Exam  General appearance: Well nourished, well developed, and well hydrated. In no apparent acute distress Mental status: Alert, oriented x 3 (person, place, & time)       Respiratory: No evidence of acute respiratory distress Eyes: PERLA Vitals: BP (!) 135/45    Pulse 69    Temp 98.4 F (36.9 C) (Temporal)    Resp 16    Ht 5' (1.524 m)    Wt 200 lb (90.7 kg)    SpO2 98%    BMI 39.06 kg/m  BMI: Estimated body mass index is 39.06 kg/m as calculated from the following:   Height as of this encounter: 5' (1.524 m).   Weight as of this encounter: 200 lb (90.7 kg). Ideal: Ideal body weight: 45.5 kg (100 lb 4.9 oz) Adjusted ideal body weight: 63.6 kg (140 lb 3 oz)  Lumbar Spine Area Exam  Skin & Axial Inspection: No masses, redness, or swelling Alignment: Symmetrical Functional ROM: Pain restricted ROM       Stability: No instability detected Muscle  Tone/Strength: Functionally intact. No obvious neuro-muscular anomalies detected. Sensory (Neurological): Musculoskeletal pain pattern Palpation: No palpable anomalies       Provocative Tests:  Patrick's Maneuver: (+) SI joint arthralgia, left                   FABER* test:(+) SI joint arthralgia   , left                S-I anterior distraction/compression test:(+) SI joint arthralgia      , left             S-I lateral compression test: (+) SI joint arthralgia    , left               S-I Thigh-thrust test: (+)   S-I arthralgia/arthropathy S-I Gaenslen's test: deferred today         *(Flexion, ABduction and External Rotation)  Gait & Posture Assessment  Ambulation:  Patient came in today in a wheel chair Gait: Significantly limited. Dependent on assistive device to ambulate Posture: WNL  Lower Extremity Exam    Side: Right lower extremity  Side: Left lower extremity  Stability: No instability observed          Stability: No instability observed          Skin & Extremity Inspection: Skin color, temperature, and hair growth are WNL. No peripheral edema or cyanosis. No masses, redness, swelling, asymmetry, or associated skin lesions. No contractures.  Skin & Extremity Inspection:  Left ankle in brace  Functional ROM: Unrestricted ROM                  Functional ROM: Pain restricted ROM                  Muscle Tone/Strength: Functionally intact. No obvious neuro-muscular anomalies detected.  Muscle Tone/Strength: Functionally intact. No obvious neuro-muscular anomalies detected.  Sensory (Neurological): Unimpaired        Sensory (Neurological): Arthropathic arthralgia        DTR: Patellar: deferred today Achilles: deferred today Plantar: deferred today  DTR: Patellar: deferred today Achilles: deferred today Plantar: deferred today  Palpation: No palpable anomalies  Palpation: No palpable anomalies    Assessment   Status Diagnosis  Having a Flare-up Having a Flare-up Having a  Flare-up 1. Chronic left SI joint pain   2. Trochanteric bursitis of right hip   3. Piriformis syndrome, left   4. SI (sacroiliac) joint dysfunction   5. Trochanteric bursitis of left hip   6. Pain in joint of right shoulder   7. Cervical radicular pain   8. Neck pain on right side   9. Bilateral primary osteoarthritis of knee   10. History of left hip replacement (2017)   11. Chronic pain syndrome   12. Encounter for long-term opiate analgesic use   13. Lumbar facet arthropathy      Updated Problems: Problem  Cervical Radicular Pain  Si (Sacroiliac) Joint Dysfunction  Trochanteric Bursitis of Right Hip  Piriformis Syndrome, Left    Plan of Care    Margaret [name] has a current medication list which includes the following long-term medication(s): aripiprazole, benztropine, bisoprolol-hydrochlorothiazide, cetirizine, escitalopram, furosemide, glimepiride, lamotrigine, losartan, metformin, pantoprazole, and spironolactone.  Pharmacotherapy (Medications Ordered): Meds ordered this encounter  Medications   HYDROcodone-acetaminophen (NORCO) 10-325 MG tablet    Sig: Take 1 tablet by mouth 3 (three) times daily as needed. For chronic pain syndrome. To last for 30 days from fill date.    Dispense:  90 tablet    Refill:  0    Ok to fill 1 day early if pharmacy closed on fill date   HYDROcodone-acetaminophen (NORCO) 10-325 MG tablet    Sig: Take 1 tablet by mouth 3 (three) times daily as needed. For chronic pain syndrome. To last for 30 days from fill date.    Dispense:  90 tablet    Refill:  0    Ok to fill 1 day early if pharmacy closed on fill date   HYDROcodone-acetaminophen (NORCO) 10-325 MG tablet    Sig: Take 1 tablet by mouth 3 (three) times daily as needed. For chronic pain syndrome. To last for 30 days from fill date.    Dispense:  90 tablet    Refill:  0    Ok to fill 1 day early if pharmacy closed on fill date   Orders:  Orders Placed This Encounter  Procedures  SACROILIAC JOINT INJECTION    Standing Status:   Future    Standing Expiration Date:   12/28/2021    Scheduling Instructions:     LEFT SI J    Order Specific Question:   Where will this procedure be performed?    Answer:   [facility]   TRIGGER POINT INJECTION    Area: Buttocks region (gluteal area) Indications: Piriformis muscle pain; Left (G57.02) piriformis-syndrome; piriformis muscle spasms (O17.510). CPT code: 20552    Standing Status:   Future    Standing Expiration Date:   11/27/2022    Scheduling Instructions:     Type: Myoneural block (TPI) of piriformis muscle.     Side: LEFT    Order Specific Question:   Where will this procedure be performed?    Answer:   [facility]   Follow-up plan:   Return in about 2 weeks (around 12/11/2021) for Left SI-J + piriformis.     Interventional management options: Planned, scheduled, and/or pending:    Lumbar epidural steroid injection at L4-L5 without sedation Intra-articular knee Hyalgan series #1 done 08/28/20 helped significantly (100% pain relief for 4 months), repeat as needed Plan for left hip greater trochanteric bursa injection with local only.   Considering:   Lumbar epidural steroid injection, lumbar facet medial branch nerve block pending lumbar MRI   PRN Procedures:   Repeat bilateral intra-articular Hyalgan injection         Recent Visits Date Type Provider Dept  10/13/21 Procedure visit Gillis Santa, MD [facility]  10/08/21 Office Visit Gillis Santa, MD [facility]  09/10/21 Procedure visit Gillis Santa, MD [facility]  09/02/21 Office Visit Gillis Santa, MD [facility]  Showing recent visits within past 90 days and meeting all other requirements Today's Visits Date Type Provider Dept  11/27/21 Office Visit Gillis Santa, MD [facility]  Showing today's visits and meeting all other requirements Future Appointments No visits were found meeting  these conditions. Showing future appointments within next 90 days and meeting all other requirements  I discussed the assessment and treatment plan with the patient. The patient was provided an opportunity to ask questions and all were answered. The patient agreed with the plan and demonstrated an understanding of the instructions.  Patient advised to call back or seek an in-person evaluation if the symptoms or condition worsens.  Duration of encounter: 38mnutes.  Note by: BGillis Santa MD Date: 11/27/2021; Time: 9:48 AM

## 2021-11-27 NOTE — Progress Notes (Signed)
Nursing Pain Medication Assessment:  Safety precautions to be maintained throughout the outpatient stay will include: orient to surroundings, keep bed in low position, maintain call bell within reach at all times, provide assistance with transfer out of bed and ambulation.  Medication Inspection Compliance: Pill count conducted under aseptic conditions, in front of the patient. Neither the pills nor the bottle was removed from the patient's sight at any time. Once count was completed pills were immediately returned to the patient in their original bottle.  Medication: Hydrocodone/APAP Pill/Patch Count:  11 of 90 pills remain Pill/Patch Appearance: Markings consistent with prescribed medication Bottle Appearance: Standard pharmacy container. Clearly labeled. Filled Date: 01 / 04 / 2023 Last Medication intake:  Today  Pt states she has 6 days worth of hydrocodone/APAP at home in pill counter.

## 2021-11-28 ENCOUNTER — Other Ambulatory Visit: Payer: Self-pay | Admitting: Psychiatry

## 2021-11-28 DIAGNOSIS — F410 Panic disorder [episodic paroxysmal anxiety] without agoraphobia: Secondary | ICD-10-CM

## 2021-12-10 ENCOUNTER — Telehealth: Payer: Self-pay

## 2021-12-10 ENCOUNTER — Encounter: Payer: Self-pay | Admitting: Psychiatry

## 2021-12-10 ENCOUNTER — Telehealth (INDEPENDENT_AMBULATORY_CARE_PROVIDER_SITE_OTHER): Payer: Medicaid Other | Admitting: Psychiatry

## 2021-12-10 ENCOUNTER — Other Ambulatory Visit: Payer: Self-pay

## 2021-12-10 DIAGNOSIS — F418 Other specified anxiety disorders: Secondary | ICD-10-CM

## 2021-12-10 DIAGNOSIS — F3176 Bipolar disorder, in full remission, most recent episode depressed: Secondary | ICD-10-CM

## 2021-12-10 MED ORDER — ARIPIPRAZOLE 10 MG PO TABS: 10.0000 mg | ORAL_TABLET | Freq: Every day | ORAL | 0 refills | Status: DC

## 2021-12-10 MED ORDER — LAMOTRIGINE 200 MG PO TABS: 200.0000 mg | ORAL_TABLET | Freq: Every day | ORAL | 0 refills | Status: DC

## 2021-12-10 NOTE — Progress Notes (Signed)
Virtual Visit via Video Note  I connected with Margaret [name] on 12/10/21 at 10:00 AM EST by a video enabled telemedicine application and verified that I am speaking with the correct person using two identifiers.  Location Provider Location : [facility] Patient Location : Home  Participants: Patient , Provider   I discussed the limitations of evaluation and management by telemedicine and the availability of in person appointments. The patient expressed understanding and agreed to proceed.   I discussed the assessment and treatment plan with the patient. The patient was provided an opportunity to ask questions and all were answered. The patient agreed with the plan and demonstrated an understanding of the instructions.   The patient was advised to call back or seek an in-person evaluation if the symptoms worsen or if the condition fails to improve as anticipated.  Video connection was lost at less than 50% of the duration of the visit, at which time the remainder of the visit was completed through audio only                                                                [facility] MD OP Progress Note  12/10/2021 10:53 AM Azaiah Mello  MRN:  [id number]  Chief Complaint:  Chief Complaint   Follow-up 66 year-old Caucasian female with history of bipolar disorder, anxiety attacks, chronic pain was evaluated for medication management.    HPI: Margaret [name] is a 66 year old Caucasian female on disability, divorced, lives in [place], has a history of bipolar disorder, bereavement, hyperlipidemia, chronic pain, was evaluated by telemedicine today.  Patient today reports she was struggling with possible low mood, feeling slowed down and sluggish the past several weeks.  She reports she has started taking 2 of her Abilify 5 mg daily since the past 4 days.  Since doing that she feels more energetic and does not feel slowed down anymore.  Patient reports mood wise she is currently feeling better on the higher  dosage.  Denies any side effects.  Would like to stay on this dosage for now.  Patient however reports she does struggle with vertigo, it comes and goes.  It restarted again a couple of days ago.  Agrees to talk to her primary care provider.  Does have meclizine available which she has started taking.  Reports sleep is overall okay.  Reports appetite is good.  Denies suicidality, homicidality or perceptual disturbances.  Denies any other concerns today.    Visit Diagnosis:    ICD-10-CM   1. Bipolar disorder, in full remission, most recent episode depressed (HCC)  F31.76 lamoTRIgine (LAMICTAL) 200 MG tablet    ARIPiprazole (ABILIFY) 10 MG tablet    2. Other specified anxiety disorders  F41.8 lamoTRIgine (LAMICTAL) 200 MG tablet   with limited symptom attack      Past Psychiatric History: Reviewed past psychiatric history from progress note on 08/21/2019.  Past trials of Depakote, lithium, risperidone, Xanax.  Past Medical History:  Past Medical History:  Diagnosis Date   Bipolar 1 disorder ([place])    Diabetes mellitus without complication ([place])    Type 2   Hypertension     Past Surgical History:  Procedure Laterality Date   ABDOMINAL HYSTERECTOMY     CHOLECYSTECTOMY     COLONOSCOPY WITH PROPOFOL  N/A 06/23/2019   Procedure: COLONOSCOPY WITH PROPOFOL;  Surgeon: Jonathon Bellows, MD;  Location: [facility] ENDOSCOPY;  Service: Gastroenterology;  Laterality: N/A;   ESOPHAGOGASTRODUODENOSCOPY (EGD) WITH PROPOFOL N/A 06/23/2019   Procedure: ESOPHAGOGASTRODUODENOSCOPY (EGD) WITH PROPOFOL;  Surgeon: Jonathon Bellows, MD;  Location: [facility] ENDOSCOPY;  Service: Gastroenterology;  Laterality: N/A;   TONSILLECTOMY      Family Psychiatric History: Reviewed family psychiatric history from progress note on 08/21/2019.  Family History:  Family History  Problem Relation Age of Onset   Mental illness Father    Bipolar disorder Brother    Bipolar disorder Paternal Uncle    Bipolar disorder Paternal Uncle      Social History: Reviewed social history from progress note on 08/21/2019. Social History   Socioeconomic History   Marital status: Divorced    Spouse name: Not on file   Number of children: 2   Years of education: Not on file   Highest education level: Not on file  Occupational History   Not on file  Tobacco Use   Smoking status: Former    Types: Cigarettes    Quit date: 03/03/2011    Years since quitting: 10.7   Smokeless tobacco: Never  Vaping Use   Vaping Use: Never used  Substance and Sexual Activity   Alcohol use: Not Currently   Drug use: Not Currently   Sexual activity: Not Currently  Other Topics Concern   Not on file  Social History Narrative   Not on file   Social Determinants of Health   Financial Resource Strain: Not on file  Food Insecurity: Not on file  Transportation Needs: Not on file  Physical Activity: Not on file  Stress: Not on file  Social Connections: Not on file    Allergies:  Allergies  Allergen Reactions   Lithium Nausea And Vomiting and Other (See Comments)   Penicillins Hives    Resulted in hospitalization   Sulfa Antibiotics Hives    Resulted in hospitalization    Metabolic Disorder Labs: Lab Results  Component Value Date   HGBA1C 7.3 (H) 06/22/2019   MPG 162.81 06/22/2019   No results found for: PROLACTIN No results found for: CHOL, TRIG, HDL, CHOLHDL, VLDL, LDLCALC Lab Results  Component Value Date   TSH 1.737 11/16/2019    Therapeutic Level Labs: No results found for: LITHIUM No results found for: VALPROATE No components found for:  CBMZ  Current Medications: Current Outpatient Medications  Medication Sig Dispense Refill   ARIPiprazole (ABILIFY) 10 MG tablet Take 1 tablet (10 mg total) by mouth daily. 90 tablet 0   pantoprazole (PROTONIX) 40 MG tablet Take 1 tablet by mouth daily.     Semaglutide, 1 MG/DOSE, 2 MG/1.5ML SOPN Inject into the skin.     acetaminophen (TYLENOL) 500 MG tablet Take 500 mg by mouth  every 6 (six) hours as needed.      alendronate (FOSAMAX) 35 MG tablet Take 35 mg by mouth every 7 (seven) days. Take with a full glass of water on an empty stomach.     B Complex-C (B-COMPLEX WITH VITAMIN C) tablet Take 1 tablet by mouth daily. 30 tablet 2   benztropine (COGENTIN) 0.5 MG tablet Take 1 tablet (0.5 mg total) by mouth daily as needed for tremors. 15 tablet 1   bisacodyl (DULCOLAX) 5 MG EC tablet Take 5 mg by mouth daily as needed for moderate constipation.     bisoprolol-hydrochlorothiazide (ZIAC) 5-6.25 MG tablet Take 1 tablet by mouth daily.  cetirizine (ZYRTEC) 10 MG tablet Take 10 mg by mouth daily.     cyclobenzaprine (FLEXERIL) 10 MG tablet TAKE 1 TABLET BY MOUTHATHREE TIMES A DAY AS NEEDED.     Dulaglutide (TRULICITY) 2.42 AS/3.4HD SOPN Inject 0.75 mg into the skin once a week. Thursday about to stop this medications per report     escitalopram (LEXAPRO) 10 MG tablet TAKE 1 1/2 TABLET BY MOUTH DAILY. 135 tablet 0   furosemide (LASIX) 20 MG tablet Take 10 mg by mouth daily as needed.     glimepiride (AMARYL) 2 MG tablet Take 2 mg by mouth daily with breakfast.      HYDROcodone-acetaminophen (NORCO) 10-325 MG tablet Take 1 tablet by mouth 3 (three) times daily as needed. For chronic pain syndrome. To last for 30 days from fill date. 90 tablet 0   [START ON 01/04/2022] HYDROcodone-acetaminophen (NORCO) 10-325 MG tablet Take 1 tablet by mouth 3 (three) times daily as needed. For chronic pain syndrome. To last for 30 days from fill date. 90 tablet 0   [START ON 02/03/2022] HYDROcodone-acetaminophen (NORCO) 10-325 MG tablet Take 1 tablet by mouth 3 (three) times daily as needed. For chronic pain syndrome. To last for 30 days from fill date. 90 tablet 0   hydrOXYzine (VISTARIL) 25 MG capsule TAKE 1 OR 2 CAPSULES BY MOUTH ONCE DAILY AS NEEDED. TAKE ONLY FOR SEVERE PANIC ATTACKS. 60 capsule 0   insulin glargine (LANTUS) 100 UNIT/ML Solostar Pen Inject into the skin.     lamoTRIgine  (LAMICTAL) 200 MG tablet Take 1 tablet (200 mg total) by mouth daily. 90 tablet 0   losartan (COZAAR) 25 MG tablet Take 25 mg by mouth daily. Takes differently     losartan (COZAAR) 50 MG tablet Take 50 mg by mouth daily.     meclizine (ANTIVERT) 25 MG tablet Take 25 mg by mouth 2 (two) times daily as needed.     meloxicam (MOBIC) 7.5 MG tablet TAKE 1 TABLET BY MOUTH EVERY DAY     metFORMIN (GLUCOPHAGE) 500 MG tablet Take 500 mg by mouth 2 (two) times daily with a meal.     OZEMPIC, 1 MG/DOSE, 4 MG/3ML SOPN Inject 1 mg into the skin once a week.     pantoprazole (PROTONIX) 40 MG tablet Take 40 mg by mouth daily.     spironolactone (ALDACTONE) 25 MG tablet Take 25 mg by mouth daily.     SURE COMFORT PEN NEEDLES 31G X 8 MM MISC USE AS DIRECTED TWICEEDAILY.T     tiZANidine (ZANAFLEX) 2 MG tablet Take by mouth.     No current facility-administered medications for this visit.     Musculoskeletal: Strength & Muscle Tone:  UTA Gait & Station:  Seated Patient leans: N/A  Psychiatric Specialty Exam: Review of Systems  Musculoskeletal:        Rt.ankle pain - chronic  Neurological:        Reports she has vertigo - chronic , comes and goes  All other systems reviewed and are negative.  There were no vitals taken for this visit.There is no height or weight on file to calculate BMI.  General Appearance: Casual  Eye Contact:  Fair  Speech:  Clear and Coherent  Volume:  Normal  Mood:  Euthymic  Affect:  Congruent  Thought Process:  Goal Directed and Descriptions of Associations: Intact  Orientation:  Full (Time, Place, and Person)  Thought Content: Logical   Suicidal Thoughts:  No  Homicidal Thoughts:  No  Memory:  Immediate;   Fair Recent;   Fair Remote;   limited  Judgement:  Fair  Insight:  Fair  Psychomotor Activity:  Normal  Concentration:  Concentration: Fair and Attention Span: Fair  Recall:  [employer] of Knowledge: Fair  Language: Fair  Akathisia:  No  Handed:  Right   AIMS (if indicated): done, 0  Assets:  Communication Skills Desire for Improvement Housing Social Support  ADL's:  Intact  Cognition: WNL  Sleep:  Fair   Screenings: [place] Office Visit from 03/04/2021 in [place] Total Score 0      PHQ2-9    [place] Video Visit from 12/10/2021 in [place] Procedure visit from 09/10/2021 in [facility] Office Visit from 09/02/2021 in [place] Office Visit from 03/04/2021 in [facility] Video Visit from 12/23/2020 in [place]  PHQ-2 Total Score 0 0 0 1 0      [place] Visit from 03/04/2021 in [place] [facility] Video Visit from 12/23/2020 in [place] Counselor from 12/09/2020 in [facility] No Risk No Risk No Risk        Assessment and Plan: Margaret [name] is a 66 year old Caucasian female on disability, divorced, lives in [place], has a history of bipolar disorder, chronic pain, multiple medical problems was evaluated for a follow-up appointment.  Patient is currently on a higher dosage of Abilify, readjusted the dosage herself.  Reports she is currently doing fairly well on this dosage.  Discussed plan as noted below.  Plan Bipolar disorder in remission Lamotrigine 200 mg p.o. daily Start Abilify 10 mg p.o. daily since she is already on this dose. Gabapentin 600 mg p.o. daily Benztropine 0.5 mg as needed for tremors. Discussed with patient not to change medication dosages herself without consulting with her provider.  Other specified anxiety with limited symptom attacks-stable Lexapro 15 mg p.o. daily Hydroxyzine 25-50 mg p.o. daily as needed for severe panic attacks only  Advised patient to  contact her primary care provider for management of her vertigo.  Follow-up in clinic in 2 to 3 months or sooner if needed.   I have spent at least 16 minutes non face to face with patient today .    This note was generated in part or whole with voice recognition software. Voice recognition is usually quite accurate but there are transcription errors that can and very often do occur. I apologize for any typographical errors that were not detected and corrected.     Ursula Alert, MD 12/11/2021, 8:27 AM

## 2021-12-10 NOTE — Telephone Encounter (Signed)
went online and submitted the prior auth for the aripiprazole it was approved from 12-10-21 to 12-10-22  PA Case # [id number]

## 2021-12-10 NOTE — Telephone Encounter (Signed)
received fax that a prior auth was needed on the aripiprazole

## 2021-12-17 ENCOUNTER — Ambulatory Visit: Payer: Medicaid Other | Admitting: Student in an Organized Health Care Education/Training Program

## 2021-12-31 ENCOUNTER — Ambulatory Visit: Payer: Medicaid Other | Admitting: Student in an Organized Health Care Education/Training Program

## 2022-01-02 ENCOUNTER — Telehealth: Payer: Self-pay | Admitting: Student in an Organized Health Care Education/Training Program

## 2022-01-02 NOTE — Telephone Encounter (Signed)
Patient notified

## 2022-01-02 NOTE — Telephone Encounter (Signed)
Patient is calling about her medication prior auth. I think Coralyn Helling was working on it. Please let patient know status ?

## 2022-01-02 NOTE — Telephone Encounter (Signed)
PA ws resent 01-01-2022 by D. Wheatley rn.  Waiting on an approval/denial.  ?

## 2022-01-14 ENCOUNTER — Other Ambulatory Visit: Payer: Self-pay

## 2022-01-14 ENCOUNTER — Ambulatory Visit (HOSPITAL_BASED_OUTPATIENT_CLINIC_OR_DEPARTMENT_OTHER): Payer: Medicaid Other | Admitting: Student in an Organized Health Care Education/Training Program

## 2022-01-14 ENCOUNTER — Encounter: Payer: Self-pay | Admitting: Student in an Organized Health Care Education/Training Program

## 2022-01-14 ENCOUNTER — Ambulatory Visit
Admission: RE | Admit: 2022-01-14 | Discharge: 2022-01-14 | Disposition: A | Discharge status: Home / Self Care | Payer: Medicaid Other | Source: Ambulatory Visit | Attending: Student in an Organized Health Care Education/Training Program | Admitting: Student in an Organized Health Care Education/Training Program

## 2022-01-14 VITALS — BP 129/61 | HR 78 | Temp 97.4°F | Resp 16 | Ht 60.0 in | Wt 200.0 lb

## 2022-01-14 DIAGNOSIS — G5702 Lesion of sciatic nerve, left lower limb: Secondary | ICD-10-CM | POA: Diagnosis not present

## 2022-01-14 DIAGNOSIS — M533 Sacrococcygeal disorders, not elsewhere classified: Secondary | ICD-10-CM

## 2022-01-14 DIAGNOSIS — G8929 Other chronic pain: Secondary | ICD-10-CM | POA: Insufficient documentation

## 2022-01-14 MED ORDER — DEXAMETHASONE SODIUM PHOSPHATE 10 MG/ML IJ SOLN
10.0000 mg | Freq: Once | INTRAMUSCULAR | Status: AC
  Administered 2022-01-14: 10 mg
  Filled 2022-01-14: qty 1

## 2022-01-14 MED ORDER — METHYLPREDNISOLONE ACETATE 40 MG/ML IJ SUSP: 40.0000 mg | Freq: Once | INTRAMUSCULAR | Status: DC

## 2022-01-14 MED ORDER — LIDOCAINE HCL 2 % IJ SOLN
20.0000 mL | Freq: Once | INTRAMUSCULAR | Status: AC
  Administered 2022-01-14: 100 mg
  Filled 2022-01-14: qty 40

## 2022-01-14 MED ORDER — ROPIVACAINE HCL 2 MG/ML IJ SOLN
9.0000 mL | Freq: Once | INTRAMUSCULAR | Status: AC
  Administered 2022-01-14: 9 mL via PERINEURAL
  Filled 2022-01-14: qty 20

## 2022-01-14 NOTE — Progress Notes (Signed)
Safety precautions to be maintained throughout the outpatient stay will include: orient to surroundings, keep bed in low position, maintain call bell within reach at all times, provide assistance with transfer out of bed and ambulation.  

## 2022-01-14 NOTE — Patient Instructions (Signed)
Trigger Point Injection Trigger points are areas where you have pain. A trigger point injection is a shot given in the trigger point to help relieve pain for a few days to a few months. Common places for trigger points include the neck, shoulders, upper back, or lower back. A trigger point injection will not cure long-term (chronic) pain permanently. These injections do not always work for every person. For some people, they can help to relieve pain for a few days to a few months. Tell a health care provider about: Any allergies you have. All medicines you are taking, including vitamins, herbs, eye drops, creams, and over-the-counter medicines. Any problems you or family members have had with anesthetic medicines. Any bleeding problems you have. Any surgeries you have had. Any medical conditions you have. Whether you are pregnant or may be pregnant. What are the risks? Generally, this is a safe procedure. However, problems may occur, including: Infection. Bleeding or bruising. Allergic reaction to the injected medicine. Irritation of the skin around the injection site. What happens before the procedure? Ask your health care provider about: Changing or stopping your regular medicines. This is especially important if you are taking diabetes medicines or blood thinners. Taking medicines such as aspirin and ibuprofen. These medicines can thin your blood. Do not take these medicines unless your health care provider tells you to take them. Taking over-the-counter medicines, vitamins, herbs, and supplements. What happens during the procedure?  Your health care provider will feel for trigger points. A marker may be used to circle the area for the injection. The skin over the trigger point will be washed with a germ-killing soap. You may be given a medicine to help you relax (sedative). A thin needle is used for the injection. You may feel pain or a twitching feeling when the needle enters your  skin. A numbing solution may be injected into the trigger point. Sometimes a medicine to keep down inflammation is also injected. Your health care provider may move the needle around the area where the trigger point is located until the tightness and twitching goes away. After the injection, your health care provider may put gentle pressure over the injection site. The injection site will be covered with a bandage (dressing). The procedure may vary among health care providers and hospitals. What can I expect after treatment? After treatment, you may have soreness and stiffness for 1-2 days. Follow these instructions at home: Injection site care Remove your dressing in a few hours, or as told by your health care provider. Check your injection site every day for signs of infection. Check for: Redness, swelling, or pain. Fluid or blood. Warmth. Pus or a bad smell. Managing pain, stiffness, and swelling If directed, put ice on the affected area. To do this: Put ice in a plastic bag. Place a towel between your skin and the bag. Leave the ice on for 20 minutes, 2-3 times a day. Remove the ice if your skin turns bright red. This is very important. If you cannot feel pain, heat, or cold, you have a greater risk of damage to the area. Activity If you were given a sedative during the procedure, it can affect you for several hours. Do not drive or operate machinery until your health care provider says that it is safe. Do not take baths, swim, or use a hot tub until your health care provider approves. Return to your normal activities as told by your health care provider. Ask your health care provider what   activities are safe for you. ?General instructions ?If you were asked to stop your regular medicines, ask your health care provider when you may start taking them again. ?You may be asked to see an occupational or physical therapist for exercises that reduce muscle strain and stretch the area of the  trigger point. ?Keep all follow-up visits. This is important. ?Contact a health care provider if: ?Your pain comes back, and it is worse than before the injection. You may need more injections. ?You have chills or a fever. ?The injection site becomes more painful, red, swollen, or warm to the touch. ?Summary ?A trigger point injection is a shot given in the trigger point to help relieve pain. ?Common places for trigger point injections are the neck, shoulders, upper back, and lower back. ?These injections do not always work for every person, but for some people, the injections can help to relieve pain for a few days to a few months. ?Contact a health care provider if symptoms come back or if they are worse than before treatment. Also, get help if the injection site becomes more painful, red, swollen, or warm to the touch. ?This information is not intended to replace advice given to you by your health care provider. Make sure you discuss any questions you have with your health care provider. ?Document Revised: 01/28/2021 Document Reviewed: 01/28/2021 ?Elsevier Patient Education ? [place]. ?Pain Management Discharge Instructions ? ?General Discharge Instructions : ? ?If you need to reach your doctor call: Monday-Friday 8:00 am - 4:00 pm at [phone] or toll free [phone].  After clinic hours [phone] to have operator reach doctor. ? ?Bring all of your medication bottles to all your appointments in the pain clinic. ? ?To cancel or reschedule your appointment with Pain Management please remember to call 24 hours in advance to avoid a fee. ? ?Refer to the educational materials which you have been given on: General Risks, I had my Procedure. Discharge Instructions, Post Sedation. ? ?Post Procedure Instructions: ? ?The drugs you were given will stay in your system until tomorrow, so for the next 24 hours you should not drive, make any legal decisions or drink any alcoholic beverages. ? ?You may eat  anything you prefer, but it is better to start with liquids then soups and crackers, and gradually work up to solid foods. ? ?Please notify your doctor immediately if you have any unusual bleeding, trouble breathing or pain that is not related to your normal pain. ? ?Depending on the type of procedure that was done, some parts of your body may feel week and/or numb.  This usually clears up by tonight or the next day. ? ?Walk with the use of an assistive device or accompanied by an adult for the 24 hours. ? ?You may use ice on the affected area for the first 24 hours.  Put ice in a Ziploc bag and cover with a towel and place against area 15 minutes on 15 minutes off.  You may switch to heat after 24 hours. ?

## 2022-01-14 NOTE — Progress Notes (Signed)
PROVIDER NOTE: Interpretation of information contained herein should be left to medically-trained personnel. Specific patient instructions are provided elsewhere under "Patient Instructions" section of medical record. This document was created in part using STT-dictation technology, any transcriptional errors that may result from this process are unintentional.  Patient: Margaret [name] Type: Established DOB: 1956/06/24 MRN: [id number] PCP: Danella Penton, MD  Service: Procedure DOS: 01/14/2022 Setting: Ambulatory Location: Ambulatory outpatient facility Delivery: Face-to-face Provider: Edward Jolly, MD Specialty: Interventional Pain Management Specialty designation: 09 Location: Outpatient facility Ref. Prov.: Danella Penton, MD    Primary Reason for Visit: Interventional Pain Management Treatment. CC: Hip Pain (bilateral)    Procedure:          Anesthesia, Analgesia, Anxiolysis:  Type: Left piriformis TPI  Anesthesia: Local (1-2% Lidocaine)  Anxiolysis: None  Sedation: None  Guidance: Fluoroscopy           Position: Prone           1. Piriformis syndrome, left   2. Chronic left SI joint pain   3. SI (sacroiliac) joint dysfunction   4. Sacroiliac joint pain    NAS-11 Pain score:   Pre-procedure: 6 /10   Post-procedure: 3 /10     Pre-op H&P Assessment:  Margaret [name] is a 66 y.o. (year old), female patient, seen today for interventional treatment. She  has a past surgical history that includes Abdominal hysterectomy; Tonsillectomy; Cholecystectomy; Esophagogastroduodenoscopy (egd) with propofol (N/A, 06/23/2019); and Colonoscopy with propofol (N/A, 06/23/2019). Margaret [name] has a current medication list which includes the following prescription(s): acetaminophen, alendronate, aripiprazole, b-complex with vitamin c, benztropine, bisacodyl, bisoprolol-hydrochlorothiazide, cetirizine, cyclobenzaprine, trulicity, escitalopram, furosemide, hydrocodone-acetaminophen, [START ON 02/03/2022]  hydrocodone-acetaminophen, hydroxyzine, insulin glargine, lamotrigine, losartan, losartan, meclizine, meloxicam, metformin, ozempic (1 mg/dose), pantoprazole, pantoprazole, spironolactone, glimepiride, and sure comfort pen needles, and the following Facility-Administered Medications: methylprednisolone acetate. Her primarily concern today is the Hip Pain (bilateral)  Initial Vital Signs:  Pulse/HCG Rate: 78ECG Heart Rate: 78 Temp: (!) 97.4 F (36.3 C) Resp: 18 BP: (!) 120/43 SpO2: 96 %  BMI: Estimated body mass index is 39.06 kg/m as calculated from the following:   Height as of this encounter: 5' (1.524 m).   Weight as of this encounter: 200 lb (90.7 kg).  Risk Assessment: Allergies: Reviewed. She is allergic to lithium, penicillins, and sulfa antibiotics.  Allergy Precautions: None required Coagulopathies: Reviewed. None identified.  Blood-thinner therapy: None at this time Active Infection(s): Reviewed. None identified. Margaret [name] is afebrile  Site Confirmation: Margaret [name] was asked to confirm the procedure and laterality before marking the site Procedure checklist: Completed Consent: Before the procedure and under the influence of no sedative(s), amnesic(s), or anxiolytics, the patient was informed of the treatment options, risks and possible complications. To fulfill our ethical and legal obligations, as recommended by the American Medical Association's Code of Ethics, I have informed the patient of my clinical impression; the nature and purpose of the treatment or procedure; the risks, benefits, and possible complications of the intervention; the alternatives, including doing nothing; the risk(s) and benefit(s) of the alternative treatment(s) or procedure(s); and the risk(s) and benefit(s) of doing nothing. The patient was provided information about the general risks and possible complications associated with the procedure. These may include, but are not limited to: failure to  achieve desired goals, infection, bleeding, organ or nerve damage, allergic reactions, paralysis, and death. In addition, the patient was informed of those risks and complications associated to the procedure, such as failure to decrease pain; infection;  bleeding; organ or nerve damage with subsequent damage to sensory, motor, and/or autonomic systems, resulting in permanent pain, numbness, and/or weakness of one or several areas of the body; allergic reactions; (i.e.: anaphylactic reaction); and/or death. Furthermore, the patient was informed of those risks and complications associated with the medications. These include, but are not limited to: allergic reactions (i.e.: anaphylactic or anaphylactoid reaction(s)); adrenal axis suppression; blood sugar elevation that in diabetics may result in ketoacidosis or comma; water retention that in patients with history of congestive heart failure may result in shortness of breath, pulmonary edema, and decompensation with resultant heart failure; weight gain; swelling or edema; medication-induced neural toxicity; particulate matter embolism and blood vessel occlusion with resultant organ, and/or nervous system infarction; and/or aseptic necrosis of one or more joints. Finally, the patient was informed that Medicine is not an exact science; therefore, there is also the possibility of unforeseen or unpredictable risks and/or possible complications that may result in a catastrophic outcome. The patient indicated having understood very clearly. We have given the patient no guarantees and we have made no promises. Enough time was given to the patient to ask questions, all of which were answered to the patient's satisfaction. Margaret [name] has indicated that she wanted to continue with the procedure. Attestation: I, the ordering provider, attest that I have discussed with the patient the benefits, risks, side-effects, alternatives, likelihood of achieving goals, and potential  problems during recovery for the procedure that I have provided informed consent. Date  Time: 01/14/2022 10:13 AM  Pre-Procedure Preparation:  Monitoring: As per clinic protocol. Respiration, ETCO2, SpO2, BP, heart rate and rhythm monitor placed and checked for adequate function Safety Precautions: Patient was assessed for positional comfort and pressure points before starting the procedure. Time-out: I initiated and conducted the "Time-out" before starting the procedure, as per protocol. The patient was asked to participate by confirming the accuracy of the "Time Out" information. Verification of the correct person, site, and procedure were performed and confirmed by me, the nursing staff, and the patient. "Time-out" conducted as per Joint Commission's Universal Protocol (UP.01.01.01). Time: 1056  Description of Procedure:          Target Area: 1cm inferior, 1 cm lateral, 1 cm deep to the Inferior, posterior, aspect of the sacroiliac fissure Approach: Posterior, paraspinal, ipsilateral approach. Area Prepped: Entire Lower Lumbosacral Region DuraPrep (Iodine Povacrylex [0.7% available iodine] and Isopropyl Alcohol, 74% w/w) Safety Precautions: Aspiration looking for blood return was conducted prior to all injections. At no point did we inject any substances, as a needle was being advanced. No attempts were made at seeking any paresthesias. Safe injection practices and needle disposal techniques used. Medications properly checked for expiration dates. SDV (single dose vial) medications used.  A piriformis injection was performed 1 cm inferior, 1 cm lateral and 1 cm deep to the inferior sacroiliac fissure with contrast highlighting piriformis muscle striations.  10 cc solution made of 9 cc of 0.2% ropivacaine, 1 cc of Decadron 10 mg/cc was injected into the left piriformis muscle under fluoroscopy.  Patient denied any pain radiating down her left leg during injection.   Vitals:   01/14/22 1016  01/14/22 1017 01/14/22 1055 01/14/22 1100  BP:  (!) 120/43 (!) 136/56 129/61  Pulse:  78    Resp:  18 18 16   Temp: (!) 97.4 F (36.3 C)     SpO2:  96% 97% 98%  Weight: 200 lb (90.7 kg)     Height: 5' (1.524 m)  Start Time: 1056 hrs. End Time: 1059 hrs. Materials:  Needle(s) Type: Spinal Needle Gauge: 22G Length: 5.0-in Medication(s): Please see orders for medications and dosing details.  Imaging Guidance (Non-Spinal):          Type of Imaging Technique: Fluoroscopy Guidance (Non-Spinal) Indication(s): Assistance in needle guidance and placement for procedures requiring needle placement in or near specific anatomical locations not easily accessible without such assistance. Exposure Time: Please see nurses notes. Contrast: Before injecting any contrast, we confirmed that the patient did not have an allergy to iodine, shellfish, or radiological contrast. Once satisfactory needle placement was completed at the desired level, radiological contrast was injected. Contrast injected under live fluoroscopy. No contrast complications. See chart for type and volume of contrast used. Fluoroscopic Guidance: I was personally present during the use of fluoroscopy. "Tunnel Vision Technique" used to obtain the best possible view of the target area. Parallax error corrected before commencing the procedure. "Direction-depth-direction" technique used to introduce the needle under continuous pulsed fluoroscopy. Once target was reached, antero-posterior, oblique, and lateral fluoroscopic projection used confirm needle placement in all planes. Images permanently stored in EMR. Interpretation: I personally interpreted the imaging intraoperatively. Adequate needle placement confirmed in multiple planes. Appropriate spread of contrast into desired area was observed. No evidence of afferent or efferent intravascular uptake. Permanent images saved into the patient's record.  Post-operative Assessment:   Post-procedure Vital Signs:  Pulse/HCG Rate: 7878 Temp:  (!) 97.4 F (36.3 C) Resp: 16 BP: 129/61 SpO2: 98 %  EBL: None  Complications: No immediate post-treatment complications observed by team, or reported by patient.  Note: The patient tolerated the entire procedure well. A repeat set of vitals were taken after the procedure and the patient was kept under observation following institutional policy, for this type of procedure. Post-procedural neurological assessment was performed, showing return to baseline, prior to discharge. The patient was provided with post-procedure discharge instructions, including a section on how to identify potential problems. Should any problems arise concerning this procedure, the patient was given instructions to immediately contact us, at any time, without hesitation. In any case, we plan to contact the patient by telephone for a follow-up status report regarding this interventional procedure.  Comments:  No additional relevant information.  Plan of Care  Orders:  Orders Placed This Encounter  Procedures   DG PAIN CLIN[facility] C-ARM 1-60 MIN NO REPORT    Intraoperative interpretation by procedural physician at [facility].    Standing Status:   Standing    Number of Occurrences:   1    Order Specific Question:   Reason for exam:    Answer:   Assistance in needle guidance and placement for procedures requiring needle placement in or near specific anatomical locations not easily accessible without such assistance.   Chronic Opioid Analgesic:  Norco 10 mg TID prn   Medications ordered for procedure: Meds ordered this encounter  Medications   lidocaine (XYLOCAINE) 2 % (with pres) injection 400 mg   dexamethasone (DECADRON) injection 10 mg   ropivacaine (PF) 2 mg/mL (0.2%) (NAROPIN) injection 9 mL   methylPREDNISolone acetate (DEPO-MEDROL) injection 40 mg   Medications administered: We administered lidocaine, dexamethasone, and ropivacaine  (PF) 2 mg/mL (0.2%).  See the medical record for exact dosing, route, and time of administration.  Follow-up plan:   Return for Keep sch. appt.       Interventional management options: Planned, scheduled, and/or pending:    Lumbar epidural steroid injection at L4-L5 without sedation Intra-articular  knee Hyalgan series #1 done 08/28/20 helped significantly (100% pain relief for 4 months), repeat as needed Plan for left hip greater trochanteric bursa injection with local only.   Considering:   Lumbar epidural steroid injection, lumbar facet medial branch nerve block pending lumbar MRI   PRN Procedures:   Repeat bilateral intra-articular Hyalgan injection          Recent Visits Date Type Provider Dept  11/27/21 Office Visit Edward Jolly, MD [facility]  Showing recent visits within past 90 days and meeting all other requirements Today's Visits Date Type Provider Dept  01/14/22 Procedure visit Edward Jolly, MD [facility]  Showing today's visits and meeting all other requirements Future Appointments Date Type Provider Dept  02/12/22 Appointment Edward Jolly, MD [facility]  Showing future appointments within next 90 days and meeting all other requirements  Disposition: Discharge home  Discharge (Date  Time): 01/14/2022; 1110 hrs.   Primary Care Physician: Danella Penton, MD Location: [facility] Note by: Edward Jolly, MD Date: 01/14/2022; Time: 12:47 PM  Disclaimer:  Medicine is not an exact science. The only guarantee in medicine is that nothing is guaranteed. It is important to note that the decision to proceed with this intervention was based on the information collected from the patient. The Data and conclusions were drawn from the patient's questionnaire, the interview, and the physical examination. Because the information was provided in large part by the patient, it cannot be guaranteed that it has not been  purposely or unconsciously manipulated. Every effort has been made to obtain as much relevant data as possible for this evaluation. It is important to note that the conclusions that lead to this procedure are derived in large part from the available data. Always take into account that the treatment will also be dependent on availability of resources and existing treatment guidelines, considered by other Pain Management Practitioners as being common knowledge and practice, at the time of the intervention. For Medico-Legal purposes, it is also important to point out that variation in procedural techniques and pharmacological choices are the acceptable norm. The indications, contraindications, technique, and results of the above procedure should only be interpreted and judged by a Board-Certified Interventional Pain Specialist with extensive familiarity and expertise in the same exact procedure and technique.

## 2022-01-15 ENCOUNTER — Telehealth: Payer: Self-pay | Admitting: *Deleted

## 2022-01-15 NOTE — Telephone Encounter (Signed)
Called for post procedure check. Denies any problems. ?

## 2022-02-12 ENCOUNTER — Ambulatory Visit
Payer: Medicaid Other | Attending: Student in an Organized Health Care Education/Training Program | Admitting: Student in an Organized Health Care Education/Training Program

## 2022-02-12 ENCOUNTER — Encounter: Payer: Self-pay | Admitting: Student in an Organized Health Care Education/Training Program

## 2022-02-12 VITALS — BP 135/58 | HR 76 | Temp 97.2°F | Resp 15 | Ht 60.0 in | Wt 188.0 lb

## 2022-02-12 DIAGNOSIS — M7061 Trochanteric bursitis, right hip: Secondary | ICD-10-CM

## 2022-02-12 DIAGNOSIS — G8929 Other chronic pain: Secondary | ICD-10-CM

## 2022-02-12 DIAGNOSIS — G894 Chronic pain syndrome: Secondary | ICD-10-CM

## 2022-02-12 DIAGNOSIS — G5702 Lesion of sciatic nerve, left lower limb: Secondary | ICD-10-CM | POA: Diagnosis not present

## 2022-02-12 DIAGNOSIS — M7062 Trochanteric bursitis, left hip: Secondary | ICD-10-CM

## 2022-02-12 DIAGNOSIS — Z96642 Presence of left artificial hip joint: Secondary | ICD-10-CM

## 2022-02-12 DIAGNOSIS — M25511 Pain in right shoulder: Secondary | ICD-10-CM | POA: Diagnosis present

## 2022-02-12 DIAGNOSIS — M542 Cervicalgia: Secondary | ICD-10-CM

## 2022-02-12 DIAGNOSIS — M170 Bilateral primary osteoarthritis of knee: Secondary | ICD-10-CM | POA: Diagnosis present

## 2022-02-12 DIAGNOSIS — Z79891 Long term (current) use of opiate analgesic: Secondary | ICD-10-CM | POA: Diagnosis present

## 2022-02-12 DIAGNOSIS — M47816 Spondylosis without myelopathy or radiculopathy, lumbar region: Secondary | ICD-10-CM

## 2022-02-12 DIAGNOSIS — M533 Sacrococcygeal disorders, not elsewhere classified: Secondary | ICD-10-CM | POA: Diagnosis not present

## 2022-02-12 DIAGNOSIS — M5412 Radiculopathy, cervical region: Secondary | ICD-10-CM

## 2022-02-12 MED ORDER — HYDROCODONE-ACETAMINOPHEN 10-325 MG PO TABS: 1.0000 | ORAL_TABLET | Freq: Three times a day (TID) | ORAL | 0 refills | Status: AC | PRN

## 2022-02-12 MED ORDER — HYDROCODONE-ACETAMINOPHEN 10-325 MG PO TABS: 1.0000 | ORAL_TABLET | Freq: Three times a day (TID) | ORAL | 0 refills | Status: DC | PRN

## 2022-02-12 NOTE — Progress Notes (Signed)
Nursing Pain Medication Assessment:  ?Safety precautions to be maintained throughout the outpatient stay will include: orient to surroundings, keep bed in low position, maintain call bell within reach at all times, provide assistance with transfer out of bed and ambulation.  ?Medication Inspection Compliance: Pill count conducted under aseptic conditions, in front of the patient. Neither the pills nor the bottle was removed from the patient's sight at any time. Once count was completed pills were immediately returned to the patient in their original bottle. ? ?Medication: Hydrocodone/APAP ?Pill/Patch Count:  73 of 90 pills remain ?Pill/Patch Appearance: Markings consistent with prescribed medication ?Bottle Appearance: Standard pharmacy container. Clearly labeled. ?Filled Date: 04 / 05 / 2023 ?Last Medication intake:  Today ?

## 2022-02-12 NOTE — Progress Notes (Signed)
PROVIDER NOTE: Information contained herein reflects review and annotations entered in association with encounter. Interpretation of such information and data should be left to medically-trained personnel. Information provided to patient can be located elsewhere in the medical record under "Patient Instructions". Document created using STT-dictation technology, any transcriptional errors that may result from process are unintentional.  ?  ?Patient: Margaret [name]  Service Category: E/M  Provider: Gillis Santa, MD  ?DOB: 1956-08-01  DOS: 02/12/2022  Specialty: Interventional Pain Management  ?MRN: [id number]  Setting: Ambulatory outpatient  PCP: Rusty Aus, MD  ?Type: Established Patient    Referring Provider: Rusty Aus, MD  ?Location: Office  Delivery: Face-to-face    ? ?HPI  ?[name], a 66 y.o. year old female, is here today because of her Piriformis syndrome, left [G57.02]. [name] primary complain today is Hip Pain (Left) ?Last encounter: My last encounter with her was on 01/14/22 ?Pertinent problems: [name] has Chronic pain syndrome; Encounter for long-term opiate analgesic use; Bilateral primary osteoarthritis of knee; SI (sacroiliac) joint dysfunction; History of left hip replacement; Recurrent major depressive episodes, moderate (Margaret [name]); Cervical radicular pain; Pain management contract signed; Lumbar facet arthropathy; Pain in joint of right shoulder; and Cervical facet joint syndrome on their pertinent problem list. ?Pain Assessment: Severity of Chronic pain is reported as a 6 /10. Location: Hip Left/denies. Onset: More than a month ago. Quality: Sharp. Timing: Constant. Modifying factor(s): meds. ?Vitals:  height is 5' (1.524 m) and weight is 188 lb (85.3 kg). Her temporal temperature is 97.2 ?F (36.2 ?C) (abnormal). Her blood pressure is 135/58 (abnormal) and her pulse is 76. Her respiration is 15 and oxygen saturation is 100%.  ? ?Reason for encounter: medication management.   ? ?-Presents for medication management. ?-Continues to have persistent left hip pain.  Is working on having a orthotic made to help with her leg length discrepancy. ?-Has lost approximately 15 pounds over the last 8 weeks by watching her diet, decreasing nighttime caloric intake and walking a little bit more.  I encouraged her to keep this up. ?-She has been experiencing increased lumbar paraspinal muscle spasms.  Recommend heat, massage, pressure therapy, magnesium ?-She has also been experiencing some constipation we discussed a bowel regimen including stool softener daily and MiraLAX if she goes more than 48 hours with having a bowel movement ? ?Pharmacotherapy Assessment  ?Analgesic: Norco 10 mg TID prn  ? ?Monitoring: ?[place] PMP: PDMP reviewed during this encounter.       ?Pharmacotherapy: Opioid-induced constipation (OIC)(K59.03, T40.2X5A) ?Compliance: No problems identified. ?Effectiveness: Clinically acceptable. ? ?[name], RN  02/12/2022  1:37 PM  Sign when Signing Visit ?Nursing Pain Medication Assessment:  ?Safety precautions to be maintained throughout the outpatient stay will include: orient to surroundings, keep bed in low position, maintain call bell within reach at all times, provide assistance with transfer out of bed and ambulation.  ?Medication Inspection Compliance: Pill count conducted under aseptic conditions, in front of the patient. Neither the pills nor the bottle was removed from the patient's sight at any time. Once count was completed pills were immediately returned to the patient in their original bottle. ? ?Medication: Hydrocodone/APAP ?Pill/Patch Count:  73 of 90 pills remain ?Pill/Patch Appearance: Markings consistent with prescribed medication ?Bottle Appearance: Standard pharmacy container. Clearly labeled. ?Filled Date: 04 / 05 / 2023 ?Last Medication intake:  Today ?  UDS:  ?Summary  ?Date Value Ref Range Status  ?05/27/2021 Note  Final  ?  Comment:  ?   ==================================================================== ?  [employer] Select 13 (MW) ?==================================================================== ?Test                             Result       Flag       Units ? ?Drug Present and Declared for Prescription Verification ?  Hydrocodone                    3150         EXPECTED   ng/mg creat ?  Dihydrocodeine                 486          EXPECTED   ng/mg creat ?  Norhydrocodone                 2791         EXPECTED   ng/mg creat ?   Sources of hydrocodone include scheduled prescription medications. ?   Dihydrocodeine and norhydrocodone are expected metabolites of ?   hydrocodone. Dihydrocodeine is also available as a scheduled ?   prescription medication. ? ?==================================================================== ?Test                      Result    Flag   Units      Ref Range ?  Creatinine              22               mg/dL      >=20 ?==================================================================== ?Declared Medications: ? The flagging and interpretation on this report are based on the ? following declared medications.  Unexpected results may arise from ? inaccuracies in the declared medications. ? ? **Note: The testing scope of this panel includes these medications: ? ? Hydrocodone (Norco) ? ? **Note: The testing scope of this panel does not include the ? following reported medications: ? ? Acetaminophen (Tylenol) ? Acetaminophen (Norco) ? Alendronate (Fosamax) ? Amlodipine (Norvasc) ? Aripiprazole (Abilify) ? Benztropine (Cogentin) ? Bisacodyl ? Bisoprolol ? Cetirizine (Zyrtec) ? Cyclobenzaprine (Flexeril) ? Dulaglutide (Trulicity) ? Escitalopram (Lexapro) ? Furosemide (Lasix) ? Gabapentin (Neurontin) ? Glimepiride (Amaryl) ? Hydrochlorothiazide ? Hydroxyzine (Vistaril) ? Insulin (Lantus) ? Iron ? Lamotrigine (Lamictal) ? Losartan (Cozaar) ? Meclizine (Antivert) ? Meloxicam (Mobic) ? Metformin (Glucophage) ? Pantoprazole  (Protonix) ? Spironolactone (Aldactone) ? Tizanidine (Zanaflex) ? Vitamin B ? Vitamin C ?==================================================================== ?For clinical consultation, please call [phone]. ?==================================================================== ?  ?  ? ?ROS  ?Constitutional: Denies any fever or chills ?Gastrointestinal: No reported hemesis, hematochezia, vomiting, or acute GI distress ?Musculoskeletal:  Left hip and SI-J pain ?Neurological: No reported episodes of acute onset apraxia, aphasia, dysarthria, agnosia, amnesia, paralysis, loss of coordination, or loss of consciousness ? ?Medication Review  ?ARIPiprazole, B-complex with vitamin C, Dulaglutide, HYDROcodone-acetaminophen, Insulin Pen Needle, Semaglutide (1 MG/DOSE), acetaminophen, alendronate, benztropine, bisacodyl, bisoprolol-hydrochlorothiazide, cetirizine, cyclobenzaprine, escitalopram, furosemide, glimepiride, hydrOXYzine, insulin glargine, lamoTRIgine, losartan, meclizine, meloxicam, metFORMIN, pantoprazole, and spironolactone ? ?History Review  ?Allergy: Ms. Albert is allergic to lithium, penicillins, and sulfa antibiotics. ?Drug: Ms. Burger  reports that she does not currently use drugs. ?Alcohol:  reports that she does not currently use alcohol. ?Tobacco:  reports that she quit smoking about 10 years ago. Her smoking use included cigarettes. She has never used smokeless tobacco. ?Social: Ms. Dudek  reports that she quit smoking about 10 years ago. Her smoking use included cigarettes. She has never used  smokeless tobacco. She reports that she does not currently use alcohol. She reports that she does not currently use drugs. ?Medical:  has a past medical history of Bipolar 1 disorder ([place]), Diabetes mellitus without complication ([place]), and Hypertension. ?Surgical: Ms. Otoole  has a past surgical history that includes Abdominal hysterectomy; Tonsillectomy; Cholecystectomy; Esophagogastroduodenoscopy (egd)  with propofol (N/A, 06/23/2019); and Colonoscopy with propofol (N/A, 06/23/2019). ?Family: family history includes Bipolar disorder in her brother, paternal uncle, and paternal uncle; Mental illness in her father. ? ?Laboratory Chemistry Profile

## 2022-02-13 ENCOUNTER — Encounter: Payer: Self-pay | Admitting: Psychiatry

## 2022-02-13 ENCOUNTER — Telehealth (INDEPENDENT_AMBULATORY_CARE_PROVIDER_SITE_OTHER): Payer: Medicaid Other | Admitting: Psychiatry

## 2022-02-13 DIAGNOSIS — Z79899 Other long term (current) drug therapy: Secondary | ICD-10-CM | POA: Diagnosis not present

## 2022-02-13 DIAGNOSIS — F3176 Bipolar disorder, in full remission, most recent episode depressed: Secondary | ICD-10-CM | POA: Diagnosis not present

## 2022-02-13 DIAGNOSIS — F418 Other specified anxiety disorders: Secondary | ICD-10-CM

## 2022-02-13 MED ORDER — ESCITALOPRAM OXALATE 10 MG PO TABS: ORAL_TABLET | ORAL | 0 refills | Status: DC

## 2022-02-13 MED ORDER — BENZTROPINE MESYLATE 0.5 MG PO TABS: 0.5000 mg | ORAL_TABLET | Freq: Every day | ORAL | 1 refills | Status: DC | PRN

## 2022-02-13 MED ORDER — ARIPIPRAZOLE 10 MG PO TABS: 10.0000 mg | ORAL_TABLET | Freq: Every day | ORAL | 0 refills | Status: DC

## 2022-02-13 MED ORDER — LAMOTRIGINE 200 MG PO TABS: 200.0000 mg | ORAL_TABLET | Freq: Every day | ORAL | 0 refills | Status: DC

## 2022-02-13 NOTE — Progress Notes (Signed)
Virtual Visit via Video Note ? ?I connected with Margaret [name] on 02/13/22 at 10:00 AM EDT by a video enabled telemedicine application and verified that I am speaking with the correct person using two identifiers. ? ?Location ?Provider Location : [facility] ?Patient Location : Home ? ?Participants: Patient , Provider ? ?  ?I discussed the limitations of evaluation and management by telemedicine and the availability of in person appointments. The patient expressed understanding and agreed to proceed. ?  ?I discussed the assessment and treatment plan with the patient. The patient was provided an opportunity to ask questions and all were answered. The patient agreed with the plan and demonstrated an understanding of the instructions. ?  ?The patient was advised to call back or seek an in-person evaluation if the symptoms worsen or if the condition fails to improve as anticipated. ? ?[facility] MD OP Progress Note ? ?02/13/2022 10:23 AM ?Margaret [name]  ?MRN:  [id number] ? ?Chief Complaint:  ?Chief Complaint  ?Patient presents with  ? Follow-up: 66 year old Caucasian female with history of bipolar disorder, chronic pain, panic attacks was evaluated for medication management.  ? ?HPI: Margaret [name] is a 66 year old Caucasian female who lives in [place] has a history of bipolar disorder, hyperlipidemia, chronic pain was evaluated by telemedicine today. ? ?Patient today reports overall mood wise she is doing fairly well.  She reports she feels happy and has been able to keep herself busy reading books, working on her poems.  She reports she also has started eating healthier, staying active as well as recently lost 12 pounds.  Her hemoglobin A1c which was around 10 in the past has come down in the past few weeks and currently is around 7.  She is excited about that. ? ?Patient reports sleep is good as long as she takes the melatonin.  Currently working on sleep hygiene. ? ?Denies any significant anxiety symptoms.  Denies any perceptual  disturbances. ? ?Denies any suicidality or homicidality. ? ?Currently compliant on her medications like Abilify, Lamictal.  Denies side effects. ? ?Patient denies any other concerns today. ? ?Visit Diagnosis:  ?  ICD-10-CM   ?1. Bipolar disorder, in full remission, most recent episode depressed ([place])  F31.76 ARIPiprazole (ABILIFY) 10 MG tablet  ?  lamoTRIgine (LAMICTAL) 200 MG tablet  ?  ?2. Other specified anxiety disorders  F41.8 escitalopram (LEXAPRO) 10 MG tablet  ? With limited symptom attacks.  ?  ?3. High risk medication use  Z79.899 benztropine (COGENTIN) 0.5 MG tablet  ?  ? ? ?Past Psychiatric History: Reviewed past psychiatric history from progress note on 08/21/2019.  Past trials of Depakote, lithium, risperidone, Xanax. ? ?Past Medical History:  ?Past Medical History:  ?Diagnosis Date  ? Bipolar 1 disorder ([place])   ? Diabetes mellitus without complication ([place])   ? Type 2  ? Hypertension   ?  ?Past Surgical History:  ?Procedure Laterality Date  ? ABDOMINAL HYSTERECTOMY    ? CHOLECYSTECTOMY    ? COLONOSCOPY WITH PROPOFOL N/A 06/23/2019  ? Procedure: COLONOSCOPY WITH PROPOFOL;  Surgeon: Jonathon Bellows, MD;  Location: [facility] ENDOSCOPY;  Service: Gastroenterology;  Laterality: N/A;  ? ESOPHAGOGASTRODUODENOSCOPY (EGD) WITH PROPOFOL N/A 06/23/2019  ? Procedure: ESOPHAGOGASTRODUODENOSCOPY (EGD) WITH PROPOFOL;  Surgeon: Jonathon Bellows, MD;  Location: [facility] ENDOSCOPY;  Service: Gastroenterology;  Laterality: N/A;  ? TONSILLECTOMY    ? ? ?Family Psychiatric History: Reviewed family psychiatric history from progress note on 08/21/2019. ? ?Family History:  ?Family History  ?Problem Relation Age of Onset  ? Mental illness  Father   ? Bipolar disorder Brother   ? Bipolar disorder Paternal Uncle   ? Bipolar disorder Paternal Uncle   ? ? ?Social History: Reviewed social history from progress note on 08/21/2019. ?Social History  ? ?Socioeconomic History  ? Marital status: Divorced  ?  Spouse name: Not on file  ? Number of children: 2   ? Years of education: Not on file  ? Highest education level: Not on file  ?Occupational History  ? Not on file  ?Tobacco Use  ? Smoking status: Former  ?  Types: Cigarettes  ?  Quit date: 03/03/2011  ?  Years since quitting: 10.9  ? Smokeless tobacco: Never  ?Vaping Use  ? Vaping Use: Never used  ?Substance and Sexual Activity  ? Alcohol use: Not Currently  ? Drug use: Not Currently  ? Sexual activity: Not Currently  ?Other Topics Concern  ? Not on file  ?Social History Narrative  ? Not on file  ? ?Social Determinants of Health  ? ?Financial Resource Strain: Not on file  ?Food Insecurity: Not on file  ?Transportation Needs: Not on file  ?Physical Activity: Not on file  ?Stress: Not on file  ?Social Connections: Not on file  ? ? ?Allergies:  ?Allergies  ?Allergen Reactions  ? Lithium Nausea And Vomiting and Other (See Comments)  ? Penicillins Hives  ?  Resulted in hospitalization  ? Sulfa Antibiotics Hives  ?  Resulted in hospitalization  ? ? ?Metabolic Disorder Labs: ?Lab Results  ?Component Value Date  ? HGBA1C 7.3 (H) 06/22/2019  ? MPG 162.81 06/22/2019  ? ?No results found for: PROLACTIN ?No results found for: CHOL, TRIG, HDL, CHOLHDL, VLDL, LDLCALC ?Lab Results  ?Component Value Date  ? TSH 1.737 11/16/2019  ? ? ?Therapeutic Level Labs: ?No results found for: LITHIUM ?No results found for: VALPROATE ?No components found for:  CBMZ ? ?Current Medications: ?Current Outpatient Medications  ?Medication Sig Dispense Refill  ? acetaminophen (TYLENOL) 500 MG tablet Take 500 mg by mouth every 6 (six) hours as needed.     ? alendronate (FOSAMAX) 35 MG tablet Take 35 mg by mouth every 7 (seven) days. Take with a full glass of water on an empty stomach.    ? ARIPiprazole (ABILIFY) 10 MG tablet Take 1 tablet (10 mg total) by mouth daily. 90 tablet 0  ? B Complex-C (B-COMPLEX WITH VITAMIN C) tablet Take 1 tablet by mouth daily. 30 tablet 2  ? benztropine (COGENTIN) 0.5 MG tablet Take 1 tablet (0.5 mg total) by mouth daily as  needed for tremors. 15 tablet 1  ? bisacodyl (DULCOLAX) 5 MG EC tablet Take 5 mg by mouth daily as needed for moderate constipation.    ? bisoprolol-hydrochlorothiazide (ZIAC) 5-6.25 MG tablet Take 1 tablet by mouth daily.    ? cetirizine (ZYRTEC) 10 MG tablet Take 10 mg by mouth daily.    ? cyclobenzaprine (FLEXERIL) 10 MG tablet TAKE 1 TABLET BY MOUTHATHREE TIMES A DAY AS NEEDED.    ? Dulaglutide (TRULICITY) 4.85 IO/2.7OJ SOPN Inject 0.75 mg into the skin once a week. Thursday about to stop this medications per report    ? escitalopram (LEXAPRO) 10 MG tablet TAKE 1 1/2 TABLET BY MOUTH DAILY. 135 tablet 0  ? furosemide (LASIX) 20 MG tablet Take 10 mg by mouth daily as needed.    ? glimepiride (AMARYL) 2 MG tablet Take 2 mg by mouth daily with breakfast.     ? [START ON 03/06/2022] HYDROcodone-acetaminophen (NORCO) 10-325  MG tablet Take 1 tablet by mouth 3 (three) times daily as needed. For chronic pain syndrome. To last for 30 days from fill date. 90 tablet 0  ? [START ON 04/05/2022] HYDROcodone-acetaminophen (NORCO) 10-325 MG tablet Take 1 tablet by mouth 3 (three) times daily as needed. For chronic pain syndrome. To last for 30 days from fill date. 90 tablet 0  ? [START ON 05/05/2022] HYDROcodone-acetaminophen (NORCO) 10-325 MG tablet Take 1 tablet by mouth 3 (three) times daily as needed for moderate pain. For chronic pain syndrome. To last for 30 days from fill date. 90 tablet 0  ? hydrOXYzine (VISTARIL) 25 MG capsule TAKE 1 OR 2 CAPSULES BY MOUTH ONCE DAILY AS NEEDED. TAKE ONLY FOR SEVERE PANIC ATTACKS. 60 capsule 0  ? insulin glargine (LANTUS) 100 UNIT/ML Solostar Pen Inject into the skin.    ? lamoTRIgine (LAMICTAL) 200 MG tablet Take 1 tablet (200 mg total) by mouth daily. 90 tablet 0  ? losartan (COZAAR) 50 MG tablet Take 50 mg by mouth daily.    ? meclizine (ANTIVERT) 25 MG tablet Take 25 mg by mouth 2 (two) times daily as needed.    ? meloxicam (MOBIC) 7.5 MG tablet TAKE 1 TABLET BY MOUTH EVERY DAY    ?  metFORMIN (GLUCOPHAGE) 500 MG tablet Take 500 mg by mouth 2 (two) times daily with a meal.    ? OZEMPIC, 1 MG/DOSE, 4 MG/3ML SOPN Inject 1 mg into the skin once a week.    ? pantoprazole (PROTONIX) 40 MG tabl

## 2022-02-19 ENCOUNTER — Encounter: Payer: Medicaid Other | Admitting: Student in an Organized Health Care Education/Training Program

## 2022-05-01 DIAGNOSIS — K5900 Constipation, unspecified: Secondary | ICD-10-CM | POA: Insufficient documentation

## 2022-05-01 DIAGNOSIS — G928 Other toxic encephalopathy: Secondary | ICD-10-CM | POA: Insufficient documentation

## 2022-05-02 DIAGNOSIS — D696 Thrombocytopenia, unspecified: Secondary | ICD-10-CM | POA: Insufficient documentation

## 2022-05-07 ENCOUNTER — Encounter: Payer: Self-pay | Admitting: Oncology

## 2022-05-13 ENCOUNTER — Encounter: Payer: Self-pay | Admitting: Oncology

## 2022-05-14 ENCOUNTER — Encounter: Payer: Self-pay | Admitting: Psychiatry

## 2022-05-14 ENCOUNTER — Telehealth (INDEPENDENT_AMBULATORY_CARE_PROVIDER_SITE_OTHER): Payer: Medicaid Other | Admitting: Psychiatry

## 2022-05-14 DIAGNOSIS — R079 Chest pain, unspecified: Secondary | ICD-10-CM | POA: Insufficient documentation

## 2022-05-14 DIAGNOSIS — F418 Other specified anxiety disorders: Secondary | ICD-10-CM | POA: Diagnosis not present

## 2022-05-14 DIAGNOSIS — S058X9A Other injuries of unspecified eye and orbit, initial encounter: Secondary | ICD-10-CM | POA: Insufficient documentation

## 2022-05-14 DIAGNOSIS — Z79899 Other long term (current) drug therapy: Secondary | ICD-10-CM | POA: Diagnosis not present

## 2022-05-14 DIAGNOSIS — F410 Panic disorder [episodic paroxysmal anxiety] without agoraphobia: Secondary | ICD-10-CM | POA: Diagnosis not present

## 2022-05-14 DIAGNOSIS — F3176 Bipolar disorder, in full remission, most recent episode depressed: Secondary | ICD-10-CM

## 2022-05-14 DIAGNOSIS — B379 Candidiasis, unspecified: Secondary | ICD-10-CM | POA: Insufficient documentation

## 2022-05-14 DIAGNOSIS — L02219 Cutaneous abscess of trunk, unspecified: Secondary | ICD-10-CM | POA: Insufficient documentation

## 2022-05-14 MED ORDER — ARIPIPRAZOLE 10 MG PO TABS: 10.0000 mg | ORAL_TABLET | Freq: Every day | ORAL | 0 refills | Status: DC

## 2022-05-14 MED ORDER — ESCITALOPRAM OXALATE 10 MG PO TABS: ORAL_TABLET | ORAL | 0 refills | Status: DC

## 2022-05-14 MED ORDER — LAMOTRIGINE 200 MG PO TABS: 200.0000 mg | ORAL_TABLET | Freq: Every day | ORAL | 0 refills | Status: DC

## 2022-05-14 MED ORDER — BENZTROPINE MESYLATE 0.5 MG PO TABS: 0.5000 mg | ORAL_TABLET | Freq: Every day | ORAL | 1 refills | Status: DC | PRN

## 2022-05-14 MED ORDER — HYDROXYZINE PAMOATE 25 MG PO CAPS: ORAL_CAPSULE | ORAL | 0 refills | Status: DC

## 2022-05-14 NOTE — Progress Notes (Signed)
Virtual Visit via Video Note  I connected with Margaret Margaret [name] on 05/14/22 at 10:40 AM EDT by a video enabled telemedicine application and verified that I am speaking with the correct person using two identifiers. Location Provider Location : [facility] Patient Location : Home  Participants: Patient , Provider    I discussed the limitations of evaluation and management by telemedicine and the availability of in person appointments. The patient expressed understanding and agreed to proceed.    I discussed the assessment and treatment plan with the patient. The patient was provided an opportunity to ask questions and all were answered. The patient agreed with the plan and demonstrated an understanding of the instructions.   The patient was advised to call back or seek an in-person evaluation if the symptoms worsen or if the condition fails to improve as anticipated.   Margaret Margaret [name] OP Progress Note  05/14/2022 12:44 PM Margaret Margaret [name]  MRN:  [id number]  Chief Complaint:  Chief Complaint  Patient presents with   Follow-up: 66 year old Caucasian female with history of bipolar disorder, chronic pain, panic attack was evaluated for medication management.   HPI: Margaret Margaret [name] is a 66 year old Caucasian female who lives in [place], has a history of bipolar disorder, hyperlipidemia, chronic pain was evaluated by telemedicine today.  Patient today reports she had a few falls/passing out episodes in the past few weeks.  Was admitted to [facility] [id number] toxic metabolic encephalopathy/hepatic encephalopathy hyperammonemia and cirrhosis.  Reviewed notes per most recent admission-Dr.Reilly .  Patient was advised to stop hydroxyzine and Zyrtec and was prescribed lactulose.  Patient also reports she was taken off of the losartan due to her falls.  Currently blood pressure is controlled.  Denies any significant mood lability, depressive symptoms.  Reports anxiety is managed on the medication.  Denies any  suicidality, homicidality or perceptual disturbances.  Reports sleep is good.  Reports appetite is fair.  Currently compliant on medications.  Denies side effects.  Denies any other concerns today.  Visit Diagnosis:    ICD-10-CM   1. 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                        Past Psychiatric History: Reviewed past psychiatric history from progress note on 08/21/2019.  Past trials of Depakote, lithium, risperidone, Xanax.  Past Medical History:  Past Medical History:  Diagnosis Date   Bipolar 1 disorder ([place])    Diabetes mellitus without complication ([place])    Type 2   Hypertension     Past Surgical History:  Procedure Laterality Date   ABDOMINAL HYSTERECTOMY     CHOLECYSTECTOMY     COLONOSCOPY WITH PROPOFOL N/A 06/23/2019   Procedure: COLONOSCOPY WITH PROPOFOL;  Surgeon: Margaret Margaret [name], Margaret [name];  Location: [facility] ENDOSCOPY;  Service: Gastroenterology;  Laterality: N/A;   ESOPHAGOGASTRODUODENOSCOPY (EGD) WITH PROPOFOL N/A 06/23/2019   Procedure: ESOPHAGOGASTRODUODENOSCOPY (EGD) WITH PROPOFOL;  Surgeon: Margaret Margaret [name], Margaret [name];  Location: [facility] ENDOSCOPY;  Service: Gastroenterology;  Laterality: N/A;   TONSILLECTOMY      Family Psychiatric History: Reviewed family psychiatric history from progress note on 08/21/2019  Family History:  Family History  Problem Relation Age of Onset   Mental illness Father    Bipolar disorder Brother    Bipolar disorder  Paternal Uncle    Bipolar disorder Paternal Uncle     Social History: Reviewed social history from progress note on 08/21/2019 Social History   Socioeconomic History   Marital status: Divorced    Spouse name: Not on file   Number of children: 2   Years of education: Not on file    Highest education level: Not on file  Occupational History   Not on file  Tobacco Use   Smoking status: Former    Types: Cigarettes    Quit date: 03/03/2011    Years since quitting: 11.2   Smokeless tobacco: Never  Vaping Use   Vaping Use: Never used  Substance and Sexual Activity   Alcohol use: Not Currently   Drug use: Not Currently   Sexual activity: Not Currently  Other Topics Concern   Not on file  Social History Narrative   Not on file   Social Determinants of Health   Financial Resource Strain: Not on file  Food Insecurity: Not on file  Transportation Needs: Not on file  Physical Activity: Not on file  Stress: Not on file  Social Connections: Not on file    Allergies:  Allergies  Allergen Reactions   Lithium Nausea And Vomiting and Other (See Comments)   Penicillins Hives    Resulted in hospitalization   Sulfa Antibiotics Hives    Resulted in hospitalization    Metabolic Disorder Labs: Lab Results  Component Value Date   HGBA1C 7.3 (H) 06/22/2019   MPG 162.81 06/22/2019   No results found for: "PROLACTIN" No results found for: "CHOL", "TRIG", "HDL", "CHOLHDL", "VLDL", "LDLCALC" Lab Results  Component Value Date   TSH 1.737 11/16/2019    Therapeutic Level Labs: No results found for: "LITHIUM" No results found for: "VALPROATE" No results found for: "CBMZ"  Current Medications: Current Outpatient Medications  Medication Sig Dispense Refill   acetaminophen (TYLENOL) 500 MG tablet Take 500 mg by mouth every 6 (six) hours as needed.      alendronate (FOSAMAX) 35 MG tablet Take 35 mg by mouth every 7 (seven) days. Take with a full glass of water on an empty stomach.     amLODipine (NORVASC) 2.5 MG tablet Take by mouth.     B Complex-C (B-COMPLEX WITH VITAMIN C) tablet Take 1 tablet by mouth daily. 30 tablet 2   furosemide (LASIX) 20 MG tablet Take 10 mg by mouth daily as needed.     glimepiride (AMARYL) 2 MG tablet Take 2 mg by mouth daily with  breakfast.      glucose blood (PRECISION QID TEST) test strip 1 each.     HYDROcodone-acetaminophen (NORCO) 10-325 MG tablet Take 1 tablet by mouth 3 (three) times daily as needed for moderate pain. For chronic pain syndrome. To last for 30 days from fill date. 90 tablet 0   insulin glargine (LANTUS) 100 UNIT/ML Solostar Pen Inject into the skin.     Insulin Glargine w/ Trans Port 100 UNIT/ML SOPN Inject into the skin.     lactulose (CHRONULAC) 10 GM/15ML solution SMARTSIG:15 Milliliter(s) By Mouth 3 Times Daily     meclizine (ANTIVERT) 25 MG tablet Take 25 mg by mouth 2 (two) times daily as needed.     metFORMIN (GLUCOPHAGE) 500 MG tablet Take 500 mg by mouth 2 (two) times daily with a meal.     OZEMPIC, 1 MG/DOSE, 4 MG/3ML SOPN Inject 1 mg into the skin once a week.     pantoprazole (PROTONIX) 40 MG tablet Take  40 mg by mouth daily.     spironolactone (ALDACTONE) 25 MG tablet Take 25 mg by mouth daily.     SURE COMFORT PEN NEEDLES 31G X 8 MM MISC USE AS DIRECTED TWICEEDAILY.T     tizanidine (ZANAFLEX) 2 MG capsule Take 2 mg by mouth 3 (three) times daily as needed for muscle spasms. Takes differently     ARIPiprazole (ABILIFY) 10 MG tablet Take 1 tablet (10 mg total) by mouth daily. 90 tablet 0   benztropine (COGENTIN) 0.5 MG tablet Take 1 tablet (0.5 mg total) by mouth daily as needed for tremors. 15 tablet 1   bisacodyl (DULCOLAX) 5 MG EC tablet Take 5 mg by mouth daily as needed for moderate constipation. (Patient not taking: Reported on 05/14/2022)     cyclobenzaprine (FLEXERIL) 10 MG tablet TAKE 1 TABLET BY MOUTHATHREE TIMES A DAY AS NEEDED. (Patient not taking: Reported on 05/14/2022)     escitalopram (LEXAPRO) 10 MG tablet TAKE 1 1/2 TABLET BY MOUTH DAILY. 135 tablet 0   hydrOXYzine (VISTARIL) 25 MG capsule TAKE 1 OR 2 CAPSULES BY MOUTH ONCE DAILY AS NEEDED. TAKE ONLY FOR SEVERE PANIC ATTACKS. 60 capsule 0   indomethacin (INDOCIN) 50 MG capsule TAKE 1 CAPSULE BY MOUTH 3 TIMES A DAY AS  NEEDED (Patient not taking: Reported on 05/14/2022)     lamoTRIgine (LAMICTAL) 200 MG tablet Take 1 tablet (200 mg total) by mouth daily. 90 tablet 0   meloxicam (MOBIC) 7.5 MG tablet TAKE 1 TABLET BY MOUTH EVERY DAY (Patient not taking: Reported on 05/14/2022)     No current facility-administered medications for this visit.     Musculoskeletal: Strength & Muscle Tone:  UTA Gait & Station:  Seated Patient leans: N/A  Psychiatric Specialty Exam: Review of Systems  Psychiatric/Behavioral: Negative.    All other systems reviewed and are negative.   There were no vitals taken for this visit.There is no height or weight on file to calculate BMI.  General Appearance: Casual  Eye Contact:  Fair  Speech:  Normal Rate  Volume:  Normal  Mood:  Euthymic  Affect:  Congruent  Thought Process:  Goal Directed and Descriptions of Associations: Intact  Orientation:  Full (Time, Place, and Person)  Thought Content: Logical   Suicidal Thoughts:  No  Homicidal Thoughts:  No  Memory:  Immediate;   Fair Recent;   Fair Remote;   Fair  Judgement:  Fair  Insight:  Fair  Psychomotor Activity:  Normal  Concentration:  Concentration: Fair and Attention Span: Fair  Recall:  [employer] of Knowledge: Fair  Language: Fair  Akathisia:  No  Handed:  Right  AIMS (if indicated): done  Assets:  Communication Skills Desire for Improvement Financial Resources/Insurance Housing Talents/Skills  ADL's:  Intact  Cognition: WNL  Sleep:  Fair   Screenings: AIMS    Flowsheet Row Video Visit from 05/14/2022 in [place] Video Visit from 02/13/2022 in [place] Office Visit from 03/04/2021 in [place] Total Score 0 0 0      GAD-7    Flowsheet Row Video Visit from 02/13/2022 in [place]  Total GAD-7 Score 2      PHQ2-9    Flowsheet Row Video Visit from 05/14/2022 in [place] Video Visit from 02/13/2022 in [place] Procedure visit from 01/14/2022 in [place] Video Visit from [facility]10/2021 in [facility] Procedure visit from 09/10/2021 in [place]  PHQ-2 Total Score 0 0 0 0 0      Flowsheet Row Video Visit from 05/14/2022 in [place] Office Visit from 03/04/2021 in [place] Video Visit from 12/23/2020 in [place] No Risk No Risk No Risk        Assessment and Plan: Rosary Filosa is a 66 year old Caucasian female on disability, divorced, lives in [place], has a history of bipolar disorder, chronic pain was evaluated by telemedicine today.  Patient is currently stable.  Plan Bipolar disorder in remission Lamotrigine 200 mg p.o. daily Abilify 10 mg p.o. daily Benztropine 0.5 mg as needed for tremors.   Other specified anxiety with limited symptom attack-stable Lexapro 15 mg p.o. daily Hydroxyzine 25-50 mg daily as needed for severe anxiety Continue psychotherapy sessions as needed Mr. Loletta Specter  Reviewed notes per most recent hospitalization as noted about 05/01/2022-Dr.Reilly.  Follow-up in clinic in 3 months or sooner if needed.  Collaboration of Care: Collaboration of Care: Referral or follow-up with counselor/therapist AEB encouraged to follow up with therapist.  Patient/Guardian was advised Release of Information must be obtained prior to any record release in order to collaborate their care with an outside provider. Patient/Guardian was advised if they have not already done so to contact the registration department to sign all necessary forms in order for [place] to release information regarding their care.   Consent: Patient/Guardian gives verbal consent for  treatment and assignment of benefits for services provided during this visit. Patient/Guardian expressed understanding and agreed to proceed.    Ursula Alert, Margaret [name] 05/15/2022, 8:25 AM

## 2022-05-19 ENCOUNTER — Encounter: Payer: Self-pay | Admitting: Oncology

## 2022-05-26 ENCOUNTER — Encounter: Payer: Self-pay | Admitting: Student in an Organized Health Care Education/Training Program

## 2022-05-26 ENCOUNTER — Ambulatory Visit
Payer: Medicaid Other | Attending: Student in an Organized Health Care Education/Training Program | Admitting: Student in an Organized Health Care Education/Training Program

## 2022-05-26 VITALS — BP 145/63 | HR 69 | Temp 97.7°F | Resp 16 | Ht 60.0 in | Wt 178.0 lb

## 2022-05-26 DIAGNOSIS — M7061 Trochanteric bursitis, right hip: Secondary | ICD-10-CM

## 2022-05-26 DIAGNOSIS — M7062 Trochanteric bursitis, left hip: Secondary | ICD-10-CM | POA: Diagnosis not present

## 2022-05-26 DIAGNOSIS — G894 Chronic pain syndrome: Secondary | ICD-10-CM | POA: Diagnosis present

## 2022-05-26 DIAGNOSIS — G5702 Lesion of sciatic nerve, left lower limb: Secondary | ICD-10-CM | POA: Diagnosis not present

## 2022-05-26 DIAGNOSIS — M47816 Spondylosis without myelopathy or radiculopathy, lumbar region: Secondary | ICD-10-CM

## 2022-05-26 DIAGNOSIS — G8929 Other chronic pain: Secondary | ICD-10-CM

## 2022-05-26 DIAGNOSIS — Z79891 Long term (current) use of opiate analgesic: Secondary | ICD-10-CM

## 2022-05-26 DIAGNOSIS — M5412 Radiculopathy, cervical region: Secondary | ICD-10-CM | POA: Diagnosis present

## 2022-05-26 DIAGNOSIS — M533 Sacrococcygeal disorders, not elsewhere classified: Secondary | ICD-10-CM | POA: Diagnosis not present

## 2022-05-26 MED ORDER — LINACLOTIDE 145 MCG PO CAPS: 145.0000 ug | ORAL_CAPSULE | Freq: Every day | ORAL | 2 refills | Status: DC

## 2022-05-26 MED ORDER — HYDROCODONE-ACETAMINOPHEN 10-325 MG PO TABS: 1.0000 | ORAL_TABLET | Freq: Three times a day (TID) | ORAL | 0 refills | Status: AC | PRN

## 2022-05-26 MED ORDER — HYDROCODONE-ACETAMINOPHEN 10-325 MG PO TABS: 1.0000 | ORAL_TABLET | Freq: Three times a day (TID) | ORAL | 0 refills | Status: DC | PRN

## 2022-05-26 NOTE — Progress Notes (Signed)
PROVIDER NOTE: Information contained herein reflects review and annotations entered in association with encounter. Interpretation of such information and data should be left to medically-trained personnel. Information provided to patient can be located elsewhere in the medical record under "Patient Instructions". Document created using STT-dictation technology, any transcriptional errors that may result from process are unintentional.    Patient: Margaret [name]  Service Category: E/M  Provider: Gillis Santa, MD  DOB: [birth year]  DOS: 05/26/2022  Specialty: Interventional Pain Management  MRN: [id number]  Setting: Ambulatory outpatient  PCP: Rusty Aus, MD  Type: Established Patient    Referring Provider: Rusty Aus, MD  Location: Office  Delivery: Face-to-face     HPI  Margaret [name], a 66 y.o. year old female, is here today because of her Piriformis syndrome, left [G57.02]. Margaret [name] primary complain today is Hip Pain (Left) and Ankle Pain (Right) Last encounter: My last encounter with her was on 02/12/22 Pertinent problems: Margaret [name] has Chronic pain syndrome; Encounter for long-term opiate analgesic use; Bilateral primary osteoarthritis of knee; SI (sacroiliac) joint dysfunction; History of left hip replacement; Recurrent major depressive episodes, moderate ([place]); Cervical radicular pain; Pain management contract signed; Lumbar facet arthropathy; Pain in joint of right shoulder; and Cervical facet joint syndrome on their pertinent problem list. Pain Assessment: Severity of Chronic pain is reported as a 5 /10. Location: Hip Left/ . Onset: More than a month ago. Quality: Other (Comment) (deep). Timing: Constant. Modifying factor(s): meds. Vitals:  height is 5' (1.524 m) and weight is 178 lb (80.7 kg). Her temporal temperature is 97.7 F (36.5 C). Her blood pressure is 145/63 (abnormal) and her pulse is 69. Her respiration is 16 and oxygen saturation is 99%.   Reason for encounter:  medication management.   -Presents for medication management. 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 Has follow up with GI- Rx of Linzess below.  Pharmacotherapy Assessment  Analgesic: Norco 10 mg TID prn   Monitoring: Margaret [name] PMP: PDMP reviewed during this encounter.       Pharmacotherapy: Opioid-induced constipation (OIC)(K59.03, T40.2X5A) Compliance: No problems identified. Effectiveness: Clinically acceptable.  [name], RN  05/26/2022  1:24 PM  Sign when Signing Visit Nursing Pain Medication Assessment:  Safety precautions to be maintained throughout the outpatient stay will include: orient to surroundings, keep bed in low position, maintain call bell within reach at all times, provide assistance with transfer out of bed and ambulation.  Medication Inspection Compliance: Margaret [name] did not comply with our request to bring her pills to be counted. She was reminded that bringing the medication bottles, even when empty, is a requirement.  Medication: None brought in. Pill/Patch Count: None available to be counted. Bottle Appearance: No container available. Did not bring bottle(s) to appointment. Filled Date: N/A Last Medication intake:  Today   UDS:  Summary  Date Value Ref Range Status  05/27/2021 Note  Final    Comment:    ==================================================================== [employer] Select 13 (MW) ==================================================================== Test                             Result       Flag  Units  Drug Present and Declared for Prescription Verification   Hydrocodone                    3150         EXPECTED   ng/mg creat    Dihydrocodeine                 486          EXPECTED   ng/mg creat   Norhydrocodone                 2791         EXPECTED   ng/mg creat    Sources of hydrocodone include scheduled prescription medications.    Dihydrocodeine and norhydrocodone are expected metabolites of    hydrocodone. Dihydrocodeine is also available as a scheduled    prescription medication.  ==================================================================== Test                      Result    Flag   Units      Ref Range   Creatinine              22               mg/dL      >=20 ==================================================================== Declared Medications:  The flagging and interpretation on this report are based on the  following declared medications.  Unexpected results may arise from  inaccuracies in the declared medications.   **Note: The testing scope of this panel includes these medications:   Hydrocodone (Norco)   **Note: The testing scope of this panel does not include the  following reported medications:   Acetaminophen (Tylenol)  Acetaminophen (Norco)  Alendronate (Fosamax)  Amlodipine (Norvasc)  Aripiprazole (Abilify)  Benztropine (Cogentin)  Bisacodyl  Bisoprolol  Cetirizine (Zyrtec)  Cyclobenzaprine (Flexeril)  Dulaglutide (Trulicity)  Escitalopram (Lexapro)  Furosemide (Lasix)  Gabapentin (Neurontin)  Glimepiride (Amaryl)  Hydrochlorothiazide  Hydroxyzine (Vistaril)  Insulin (Lantus)  Iron  Lamotrigine (Lamictal)  Losartan (Cozaar)  Meclizine (Antivert)  Meloxicam (Mobic)  Metformin (Glucophage)  Pantoprazole (Protonix)  Spironolactone (Aldactone)  Tizanidine (Zanaflex)  Vitamin B  Vitamin C ==================================================================== For clinical consultation, please call [phone]. ====================================================================      ROS  Constitutional: Denies any fever or chills Gastrointestinal: No  reported hemesis, hematochezia, vomiting, or acute GI distress Musculoskeletal:  Left hip and SI-J pain Neurological: No reported episodes of acute onset apraxia, aphasia, dysarthria, agnosia, amnesia, paralysis, loss of coordination, or loss of consciousness  Medication Review  ARIPiprazole, B-complex with vitamin C, HYDROcodone-acetaminophen, Insulin Glargine w/ Trans Port, Insulin Pen Needle, Semaglutide (1 MG/DOSE), acetaminophen, alendronate, amLODipine, benztropine, escitalopram, furosemide, glimepiride, glucose blood, hydrOXYzine, insulin glargine, lactulose, lamoTRIgine, linaclotide, meclizine, metFORMIN, pantoprazole, spironolactone, and tizanidine  History Review  Allergy: Margaret [name] is allergic to lithium, penicillins, and sulfa antibiotics. Drug: Margaret [name]  reports that she does not currently use drugs. Alcohol:  reports that she does not currently use alcohol. Tobacco:  reports that she quit smoking about 11 years ago. Her smoking use included cigarettes. She has never used smokeless tobacco. Social: Margaret [name]  reports that she quit smoking about 11 years ago. Her smoking use included cigarettes. She has never used smokeless tobacco. She reports that she does not currently use alcohol. She reports that she does not currently use drugs. Medical:  has a past medical history of Bipolar 1 disorder ([place]), Diabetes mellitus without complication ([place]), and Hypertension. Surgical: Margaret [name]  has a past surgical history that includes Abdominal hysterectomy; Tonsillectomy; Cholecystectomy; Esophagogastroduodenoscopy (egd) with propofol (N/A, 06/23/2019); and Colonoscopy with propofol (N/A, 06/23/2019). Family: family history includes Bipolar disorder in her brother, paternal uncle, and paternal uncle; Mental illness in her father.  Laboratory Chemistry Profile   Renal Lab Results  Component Value Date   BUN 11 12/06/2019   CREATININE 0.59 12/06/2019   BCR 19 12/06/2019   GFRAA 114  12/06/2019   GFRNONAA 99 12/06/2019    Hepatic Lab Results  Component Value Date   AST 48 (H) 12/06/2019   ALT 47 (H) 12/06/2019   ALBUMIN 3.7 (L) 12/06/2019   ALKPHOS 122 (H) 12/06/2019    Electrolytes Lab Results  Component Value Date   NA 137 12/06/2019   K 3.9 12/06/2019   CL 101 12/06/2019   CALCIUM 9.7 12/06/2019    Bone No results found for: "VD25OH", "VD125OH2TOT", "LY6503TW6", "FK8127NT7", "25OHVITD1", "25OHVITD2", "25OHVITD3", "TESTOFREE", "TESTOSTERONE"  Inflammation (CRP: Acute Phase) (ESR: Chronic Phase) No results found for: "CRP", "ESRSEDRATE", "LATICACIDVEN"       Note: Above Lab results reviewed.   Physical Exam  General appearance: Well nourished, well developed, and well hydrated. In no apparent acute distress Mental status: Alert, oriented x 3 (person, place, & time)       Respiratory: No evidence of acute respiratory distress Eyes: PERLA Vitals: BP (!) 145/63   Pulse 69   Temp 97.7 F (36.5 C) (Temporal)   Resp 16   Ht 5' (1.524 m)   Wt 178 lb (80.7 kg)   SpO2 99%   BMI 34.76 kg/m  BMI: Estimated body mass index is 34.76 kg/m as calculated from the following:   Height as of this encounter: 5' (1.524 m).   Weight as of this encounter: 178 lb (80.7 kg). Ideal: Ideal body weight: 45.5 kg (100 lb 4.9 oz) Adjusted ideal body weight: 59.6 kg (131 lb 6.2 oz)  Lumbar Spine Area Exam  Skin & Axial Inspection: No masses, redness, or swelling Alignment: Symmetrical Functional ROM: Pain restricted ROM       Stability: No instability detected Muscle Tone/Strength: Functionally intact. No obvious neuro-muscular anomalies detected. Sensory (Neurological): Musculoskeletal pain pattern  Lower Extremity Exam    Side: Right lower extremity  Side: Left lower extremity  Stability: No instability observed          Stability: No instability observed          Skin & Extremity Inspection: Skin color, temperature, and hair growth are WNL. No peripheral edema or  cyanosis. No masses, redness, swelling, asymmetry, or associated skin lesions. No contractures.  Skin & Extremity Inspection:  Left ankle in brace  Functional ROM: Unrestricted ROM                  Functional ROM: Pain restricted ROM                  Muscle Tone/Strength: Functionally intact. No obvious neuro-muscular anomalies detected.  Muscle Tone/Strength: Functionally intact. No obvious neuro-muscular anomalies detected.  Sensory (Neurological): Unimpaired        Sensory (Neurological): Arthropathic arthralgia        DTR: Patellar: deferred today Achilles: deferred today Plantar: deferred today  DTR: Patellar: deferred today Achilles: deferred today Plantar: deferred today  Palpation: No palpable anomalies  Palpation: No palpable anomalies    Assessment   Status Diagnosis  Persistent Persistent Persistent 1. Piriformis syndrome, left   2. Chronic left SI joint pain   3.  SI (sacroiliac) joint dysfunction   4. Sacroiliac joint pain   5. Trochanteric bursitis of right hip   6. Trochanteric bursitis of left hip   7. Chronic pain syndrome   8. Cervical radicular pain   9. Encounter for long-term opiate analgesic use   10. Lumbar facet arthropathy      Updated Problems: No problems updated.   Plan of Care    Margaret [name] has a current medication list which includes the following long-term medication(s): aripiprazole, benztropine, escitalopram, furosemide, glimepiride, lamotrigine, linaclotide, metformin, pantoprazole, and spironolactone.  Pharmacotherapy (Medications Ordered): Meds ordered this encounter  Medications   HYDROcodone-acetaminophen (NORCO) 10-325 MG tablet    Sig: Take 1 tablet by mouth 3 (three) times daily as needed for moderate pain. For chronic pain syndrome. To last for 30 days from fill date.    Dispense:  90 tablet    Refill:  0    Ok to fill 1 day early if pharmacy closed on fill date   HYDROcodone-acetaminophen (NORCO) 10-325 MG tablet     Sig: Take 1 tablet by mouth 3 (three) times daily as needed for moderate pain. For chronic pain syndrome. To last for 30 days from fill date.    Dispense:  90 tablet    Refill:  0    Ok to fill 1 day early if pharmacy closed on fill date   HYDROcodone-acetaminophen (NORCO) 10-325 MG tablet    Sig: Take 1 tablet by mouth 3 (three) times daily as needed for moderate pain. For chronic pain syndrome. To last for 30 days from fill date.    Dispense:  90 tablet    Refill:  0    Ok to fill 1 day early if pharmacy closed on fill date   linaclotide (LINZESS) 145 MCG CAPS capsule    Sig: Take 1 capsule (145 mcg total) by mouth daily before breakfast. As needed.    Dispense:  30 capsule    Refill:  2    Fill one day early if pharmacy is closed on scheduled refill date. Generic permitted. Do not send renewal requests.   Orders:  Orders Placed This Encounter  Procedures   [employer] Select 13 (MW), Urine    Volume: 30 ml(s). Minimum 3 ml of urine is needed. Document temperature of fresh sample. Indications: Long term (current) use of opiate analgesic [phone])    Order Specific Question:   Release to patient    Answer:   Immediate   Follow-up plan:   Return in about 3 months (around 08/26/2022) for Medication Management, in person.     Interventional management options: Planned, scheduled, and/or pending:    Lumbar epidural steroid injection at L4-L5 without sedation Intra-articular knee Hyalgan series #1 done 08/28/20 helped significantly (100% pain relief for 4 months), repeat as needed left hip greater trochanteric bursa injection with local only.   Considering:   Lumbar epidural steroid injection, lumbar facet medial branch nerve block pending lumbar MRI   PRN Procedures:   Repeat bilateral intra-articular Hyalgan injection         Recent Visits No visits were found meeting these conditions. Showing recent visits within past 90 days and meeting all other requirements Today's  Visits Date Type Provider Dept  05/26/22 Office Visit Gillis Santa, MD [facility]  Showing today's visits and meeting all other requirements Future Appointments No visits were found meeting these conditions. Showing future appointments within next 90 days and meeting all other requirements  I discussed the  assessment and treatment plan with the patient. The patient was provided an opportunity to ask questions and all were answered. The patient agreed with the plan and demonstrated an understanding of the instructions.  Patient advised to call back or seek an in-person evaluation if the symptoms or condition worsens.  Duration of encounter: 65mnutes.  Note by: BGillis Santa MD Date: 05/26/2022; Time: 2:01 PM

## 2022-05-26 NOTE — Progress Notes (Signed)
Nursing Pain Medication Assessment:  Safety precautions to be maintained throughout the outpatient stay will include: orient to surroundings, keep bed in low position, maintain call bell within reach at all times, provide assistance with transfer out of bed and ambulation.  Medication Inspection Compliance: Margaret [name] did not comply with our request to bring her pills to be counted. She was reminded that bringing the medication bottles, even when empty, is a requirement.  Medication: None brought in. Pill/Patch Count: None available to be counted. Bottle Appearance: No container available. Did not bring bottle(s) to appointment. Filled Date: N/A Last Medication intake:  Today

## 2022-06-01 ENCOUNTER — Encounter: Payer: Self-pay | Admitting: Oncology

## 2022-06-01 LAB — TOXASSURE SELECT 13 (MW), URINE

## 2022-06-03 ENCOUNTER — Encounter: Payer: Self-pay | Admitting: Oncology

## 2022-06-04 ENCOUNTER — Other Ambulatory Visit: Payer: Self-pay | Admitting: Gastroenterology

## 2022-06-04 DIAGNOSIS — K7460 Unspecified cirrhosis of liver: Secondary | ICD-10-CM

## 2022-06-24 ENCOUNTER — Ambulatory Visit
Admission: RE | Admit: 2022-06-24 | Discharge: 2022-06-24 | Disposition: A | Discharge status: Home / Self Care | Payer: Medicaid Other | Source: Ambulatory Visit | Attending: Gastroenterology | Admitting: Gastroenterology

## 2022-06-24 DIAGNOSIS — K7460 Unspecified cirrhosis of liver: Secondary | ICD-10-CM | POA: Diagnosis present

## 2022-07-16 ENCOUNTER — Telehealth: Payer: Self-pay

## 2022-07-16 NOTE — Telephone Encounter (Signed)
Medication management - Telephone call with [facility] with Tobe Sos, pharmacist to inform patient's Aripiprazole was approved by [facility].  Pharmacist verified he was able to fill as prescribed.

## 2022-07-20 ENCOUNTER — Encounter: Payer: Self-pay | Admitting: Oncology

## 2022-07-23 ENCOUNTER — Telehealth (INDEPENDENT_AMBULATORY_CARE_PROVIDER_SITE_OTHER): Payer: Medicaid Other | Admitting: Psychiatry

## 2022-07-23 ENCOUNTER — Encounter: Payer: Self-pay | Admitting: Psychiatry

## 2022-07-23 DIAGNOSIS — F3176 Bipolar disorder, in full remission, most recent episode depressed: Secondary | ICD-10-CM | POA: Diagnosis not present

## 2022-07-23 DIAGNOSIS — F418 Other specified anxiety disorders: Secondary | ICD-10-CM

## 2022-07-23 DIAGNOSIS — Z79899 Other long term (current) drug therapy: Secondary | ICD-10-CM

## 2022-07-23 MED ORDER — ESCITALOPRAM OXALATE 10 MG PO TABS: 10.0000 mg | ORAL_TABLET | Freq: Every day | ORAL | 0 refills | Status: DC

## 2022-07-23 MED ORDER — BENZTROPINE MESYLATE 0.5 MG PO TABS: 0.5000 mg | ORAL_TABLET | Freq: Every day | ORAL | 1 refills | Status: DC | PRN

## 2022-07-23 MED ORDER — LAMOTRIGINE 200 MG PO TABS: 200.0000 mg | ORAL_TABLET | Freq: Every day | ORAL | 0 refills | Status: DC

## 2022-07-23 MED ORDER — ARIPIPRAZOLE 10 MG PO TABS: 10.0000 mg | ORAL_TABLET | Freq: Every day | ORAL | 0 refills | Status: DC

## 2022-07-23 NOTE — Progress Notes (Signed)
Virtual Visit via Video Note  I connected with Margaret [name] on 07/23/22 at  3:30 PM EDT by a video enabled telemedicine application and verified that I am speaking with the correct person using two identifiers.  Location Provider Location : [facility] Patient Location : Home  Participants: Patient , Provider   I discussed the limitations of evaluation and management by telemedicine and the availability of in person appointments. The patient expressed understanding and agreed to proceed.    I discussed the assessment and treatment plan with the patient. The patient was provided an opportunity to ask questions and all were answered. The patient agreed with the plan and demonstrated an understanding of the instructions.   The patient was advised to call back or seek an in-person evaluation if the symptoms worsen or if the condition fails to improve as anticipated.   [place] MD OP Progress Note  07/24/2022 8:27 AM Margaret [name]  MRN:  [id number]  Chief Complaint:  Chief Complaint  Patient presents with   Follow-up: 66 year old Caucasian female with history of bipolar disorder, chronic pain, panic attacks was evaluated for medication management.   HPI: Margaret [name] is a 66 year old Caucasian female who lives in [place], has a history of bipolar disorder, hyperlipidemia, chronic pain was evaluated by telemedicine today.  Patient today reports she is currently doing overall well with regards to her mood.  Denies any significant anxiety.  Denies any sadness.  She reports she has been trying to spend time with her friend.  Patient reports sleep as good.  Reports she is currently on MiraLAX which she takes few times a day.  She reports that does help her to empty her bowel and that patient is not constipated.  Patient denies any side effects to medications.  Reports she is compliant on the Abilify, Lamictal, Lexapro.  Does use the Cogentin as needed.  Denies any suicidality, homicidality or  perceptual disturbances.  Patient denies any other concerns today.  Visit Diagnosis:    ICD-10-CM   1. Bipolar disorder, in full remission, most recent episode depressed (HCC)  F31.76 ARIPiprazole (ABILIFY) 10 MG tablet    lamoTRIgine (LAMICTAL) 200 MG tablet    2. Other specified anxiety disorders  F41.8 escitalopram (LEXAPRO) 10 MG tablet   With limited symptom attacks.    3. High risk medication use  Z79.899 benztropine (COGENTIN) 0.5 MG tablet      Past Psychiatric History: Reviewed past psychiatric history from progress note on 08/21/2019.  Past trials of Depakote, lithium, risperidone, Xanax.  Past Medical History:  Past Medical History:  Diagnosis Date   Bipolar 1 disorder ([place])    Diabetes mellitus without complication ([place])    Type 2   Hypertension     Past Surgical History:  Procedure Laterality Date   ABDOMINAL HYSTERECTOMY     CHOLECYSTECTOMY     COLONOSCOPY WITH PROPOFOL N/A 06/23/2019   Procedure: COLONOSCOPY WITH PROPOFOL;  Surgeon: Jonathon Bellows, MD;  Location: [facility] ENDOSCOPY;  Service: Gastroenterology;  Laterality: N/A;   ESOPHAGOGASTRODUODENOSCOPY (EGD) WITH PROPOFOL N/A 06/23/2019   Procedure: ESOPHAGOGASTRODUODENOSCOPY (EGD) WITH PROPOFOL;  Surgeon: Jonathon Bellows, MD;  Location: [facility] ENDOSCOPY;  Service: Gastroenterology;  Laterality: N/A;   TONSILLECTOMY      Family Psychiatric History: Reviewed family psychiatric history from progress note on 08/21/2019.  Family History:  Family History  Problem Relation Age of Onset   Mental illness Father    Bipolar disorder Brother    Bipolar disorder Paternal Uncle    Bipolar disorder Paternal  Uncle     Social History: Reviewed  social history from progress note on 08/21/2019. Social History   Socioeconomic History   Marital status: Divorced    Spouse name: Not on file   Number of children: 2   Years of education: Not on file   Highest education level: Not on file  Occupational History   Not on file   Tobacco Use   Smoking status: Former    Types: Cigarettes    Quit date: 03/03/2011    Years since quitting: 11.4   Smokeless tobacco: Never  Vaping Use   Vaping Use: Never used  Substance and Sexual Activity   Alcohol use: Not Currently   Drug use: Not Currently   Sexual activity: Not Currently  Other Topics Concern   Not on file  Social History Narrative   Not on file   Social Determinants of Health   Financial Resource Strain: Not on file  Food Insecurity: Not on file  Transportation Needs: Not on file  Physical Activity: Not on file  Stress: Not on file  Social Connections: Not on file    Allergies:  Allergies  Allergen Reactions   Lithium Nausea And Vomiting and Other (See Comments)   Penicillins Hives    Resulted in hospitalization   Sulfa Antibiotics Hives    Resulted in hospitalization    Metabolic Disorder Labs: Lab Results  Component Value Date   HGBA1C 7.3 (H) 06/22/2019   MPG 162.81 06/22/2019   No results found for: "PROLACTIN" No results found for: "CHOL", "TRIG", "HDL", "CHOLHDL", "VLDL", "LDLCALC" Lab Results  Component Value Date   TSH 1.737 11/16/2019    Therapeutic Level Labs: No results found for: "LITHIUM" No results found for: "VALPROATE" No results found for: "CBMZ"  Current Medications: Current Outpatient Medications  Medication Sig Dispense Refill   acetaminophen (TYLENOL) 500 MG tablet Take 500 mg by mouth every 6 (six) hours as needed.      alendronate (FOSAMAX) 35 MG tablet Take 35 mg by mouth every 7 (seven) days. Take with a full glass of water on an empty stomach.     amLODipine (NORVASC) 2.5 MG tablet Take by mouth.     furosemide (LASIX) 20 MG tablet Take 10 mg by mouth daily as needed.     glimepiride (AMARYL) 2 MG tablet Take 2 mg by mouth daily with breakfast.      glucose blood (PRECISION QID TEST) test strip 1 each.     HYDROcodone-acetaminophen (NORCO) 10-325 MG tablet Take 1 tablet by mouth 3 (three) times daily as  needed for moderate pain. For chronic pain syndrome. To last for 30 days from fill date. 90 tablet 0   [START ON 08/03/2022] HYDROcodone-acetaminophen (NORCO) 10-325 MG tablet Take 1 tablet by mouth 3 (three) times daily as needed for moderate pain. For chronic pain syndrome. To last for 30 days from fill date. 90 tablet 0   hydrOXYzine (VISTARIL) 25 MG capsule TAKE 1 OR 2 CAPSULES BY MOUTH ONCE DAILY AS NEEDED. TAKE ONLY FOR SEVERE PANIC ATTACKS. 60 capsule 0   insulin glargine (LANTUS) 100 UNIT/ML Solostar Pen Inject into the skin.     Insulin Glargine w/ Trans Port 100 UNIT/ML SOPN Inject into the skin.     lactulose (CHRONULAC) 10 GM/15ML solution Take 30 g by mouth 3 (three) times daily.     meclizine (ANTIVERT) 25 MG tablet Take 25 mg by mouth 2 (two) times daily as needed.     metFORMIN (GLUCOPHAGE) 500  MG tablet Take 500 mg by mouth 2 (two) times daily with a meal.     OZEMPIC, 1 MG/DOSE, 4 MG/3ML SOPN Inject 1 mg into the skin once a week.     polyethylene glycol powder (GLYCOLAX/MIRALAX) 17 GM/SCOOP powder SMARTSIG:17 Gram(s) By Mouth 1-2 Times Daily     rifaximin (XIFAXAN) 550 MG TABS tablet Take by mouth.     spironolactone (ALDACTONE) 25 MG tablet Take 25 mg by mouth daily.     SURE COMFORT PEN NEEDLES 31G X 8 MM MISC USE AS DIRECTED TWICEEDAILY.T     tizanidine (ZANAFLEX) 2 MG capsule Take 2 mg by mouth 3 (three) times daily as needed for muscle spasms. Takes differently     ARIPiprazole (ABILIFY) 10 MG tablet Take 1 tablet (10 mg total) by mouth daily. 90 tablet 0   B Complex-C (B-COMPLEX WITH VITAMIN C) tablet Take 1 tablet by mouth daily. (Patient not taking: Reported on 07/23/2022) 30 tablet 2   benztropine (COGENTIN) 0.5 MG tablet Take 1 tablet (0.5 mg total) by mouth daily as needed for tremors. 15 tablet 1   ciprofloxacin (CIPRO) 500 MG tablet Take 500 mg by mouth 2 (two) times daily. (Patient not taking: Reported on 07/23/2022)     escitalopram (LEXAPRO) 10 MG tablet Take 1  tablet (10 mg total) by mouth daily. 90 tablet 0   GAVILYTE-G 236 g solution SMARTSIG:Milliliter(s) By Mouth (Patient not taking: Reported on 07/23/2022)     lamoTRIgine (LAMICTAL) 200 MG tablet Take 1 tablet (200 mg total) by mouth daily. 90 tablet 0   pantoprazole (PROTONIX) 40 MG tablet Take 40 mg by mouth daily. (Patient not taking: Reported on 07/23/2022)     No current facility-administered medications for this visit.     Musculoskeletal: Strength & Muscle Tone:  UTA Gait & Station:  Seated Patient leans: N/A  Psychiatric Specialty Exam: Review of Systems  Psychiatric/Behavioral: Negative.    All other systems reviewed and are negative.   There were no vitals taken for this visit.There is no height or weight on file to calculate BMI.  General Appearance: Casual  Eye Contact:  Fair  Speech:  Clear and Coherent  Volume:  Normal  Mood:  Euthymic  Affect:  Full Range  Thought Process:  Goal Directed and Descriptions of Associations: Intact  Orientation:  Full (Time, Place, and Person)  Thought Content: Logical   Suicidal Thoughts:  No  Homicidal Thoughts:  No  Memory:  Immediate;   Fair Recent;   Fair Remote;   Fair  Judgement:  Fair  Insight:  Fair  Psychomotor Activity:  Normal  Concentration:  Concentration: Fair and Attention Span: Fair  Recall:  [employer] of Knowledge: Fair  Language: Fair  Akathisia:  No  Handed:  Right  AIMS (if indicated): not done  Assets:  Communication Skills Desire for Improvement Housing Transportation  ADL's:  Intact  Cognition: WNL  Sleep:  Fair   Screenings: AIMS    Flowsheet Row Video Visit from 05/14/2022 in [place] Video Visit from 02/13/2022 in [place] Office Visit from 03/04/2021 in [facility] Total Score 0 0 0      GAD-7    Flowsheet Row Video Visit from 02/13/2022 in [facility] [place]  Total  GAD-7 Score 2      PHQ2-9    [place] Video Visit from 07/23/2022 in [place] Video Visit from [facility]14/2022 in [facility] Video Visit from 02/13/2022 in [place] Procedure visit from 01/14/2022 in [place] Video Visit from 12/10/2021 in [facility]  PHQ-2 Total Score 0 0 0 0 0      Flowsheet Row Video Visit from 07/23/2022 in [place] Video Visit from 05/14/2022 in [place] Office Visit from 03/04/2021 in [facility] No Risk No Risk No Risk        Assessment and Plan: Amaria Mundorf is a 66 year old Caucasian female who has a history of bipolar disorder, chronic pain, on disability, divorced, lives in [place] was evaluated by telemedicine today.  Patient is currently stable.  Plan as noted below.  Plan  Bipolar disorder in remission Lamotrigine 200 mg p.o. daily Abilify 10 mg p.o. daily Benztropine 0.5 mg as needed for tremors  Other specified anxiety with limited symptom attack-stable Will reduce Lexapro to 10 mg p.o. daily since she is stable. Hydroxyzine 25-50 mg p.o. daily as needed for anxiety attacks Patient is currently not in psychotherapy, reports she was released by her therapist.  She could follow up with her therapist Mr. Loletta Specter as needed.   High risk medication use Patient with a history of low platelet count-chronic-most recent one on 06/04/2022-117-patient advised to follow up with primary care provider.  Will continue to monitor her hemoglobin A1c-dated 06/04/2022-elevated at 7.4, lipid panel-06/04/2022-within normal limits.  Patient on Abilify and this needs to be monitored.  Patient to continue to follow up with primary care provider for management.  Follow-up in clinic in 2 months or  sooner in person.   Collaboration of Care: Collaboration of Care: Other encouraged to follow up with primary care provider.  Patient/Guardian was advised Release of Information must be obtained prior to any record release in order to collaborate their care with an outside provider. Patient/Guardian was advised if they have not already done so to contact the registration department to sign all necessary forms in order for [place] to release information regarding their care.   Consent: Patient/Guardian gives verbal consent for treatment and assignment of benefits for services provided during this visit. Patient/Guardian expressed understanding and agreed to proceed.   This note was generated in part or whole with voice recognition software. Voice recognition is usually quite accurate but there are transcription errors that can and very often do occur. I apologize for any typographical errors that were not detected and corrected.      Margaret Alert, MD 07/24/2022, 8:27 AM

## 2022-08-13 ENCOUNTER — Encounter: Payer: Self-pay | Admitting: Oncology

## 2022-08-20 ENCOUNTER — Ambulatory Visit
Payer: Medicaid Other | Attending: Student in an Organized Health Care Education/Training Program | Admitting: Student in an Organized Health Care Education/Training Program

## 2022-08-20 ENCOUNTER — Encounter: Payer: Self-pay | Admitting: Student in an Organized Health Care Education/Training Program

## 2022-08-20 VITALS — BP 138/58 | HR 63 | Temp 97.1°F | Resp 17 | Ht 60.0 in | Wt 171.0 lb

## 2022-08-20 DIAGNOSIS — G5702 Lesion of sciatic nerve, left lower limb: Secondary | ICD-10-CM

## 2022-08-20 DIAGNOSIS — G894 Chronic pain syndrome: Secondary | ICD-10-CM | POA: Diagnosis present

## 2022-08-20 DIAGNOSIS — M7062 Trochanteric bursitis, left hip: Secondary | ICD-10-CM

## 2022-08-20 DIAGNOSIS — M533 Sacrococcygeal disorders, not elsewhere classified: Secondary | ICD-10-CM

## 2022-08-20 DIAGNOSIS — M7061 Trochanteric bursitis, right hip: Secondary | ICD-10-CM

## 2022-08-20 DIAGNOSIS — G8929 Other chronic pain: Secondary | ICD-10-CM

## 2022-08-20 MED ORDER — HYDROCODONE-ACETAMINOPHEN 5-325 MG PO TABS: 1.0000 | ORAL_TABLET | Freq: Four times a day (QID) | ORAL | 0 refills | Status: DC | PRN

## 2022-08-20 NOTE — Progress Notes (Signed)
PROVIDER NOTE: Information contained herein reflects review and annotations entered in association with encounter. Interpretation of such information and data should be left to medically-trained personnel. Information provided to patient can be located elsewhere in the medical record under "Patient Instructions". Document created using STT-dictation technology, any transcriptional errors that may result from process are unintentional.    Patient: Margaret [name]  Service Category: E/M  Provider: Gillis Santa, MD  DOB: 07-13-1956  DOS: 08/20/2022  Specialty: Interventional Pain Management  MRN: [id number]  Setting: Ambulatory outpatient  PCP: Rusty Aus, MD  Type: Established Patient    Referring Provider: Rusty Aus, MD  Location: Office  Delivery: Face-to-face     HPI  [name], a 66 y.o. year old female, is here today because of her Piriformis syndrome, left [G57.02]. [name] primary complain today is Foot Pain (right) and Hip Pain (left) Last encounter: My last encounter with her was on 05/26/22 Pertinent problems: [name] has Chronic pain syndrome; Encounter for long-term opiate analgesic use; Bilateral primary osteoarthritis of knee; SI (sacroiliac) joint dysfunction; History of left hip replacement; Recurrent major depressive episodes, moderate ([place]); Cervical radicular pain; Pain management contract signed; Lumbar facet arthropathy; Pain in joint of right shoulder; and Cervical facet joint syndrome on their pertinent problem list. Pain Assessment: Severity of Chronic pain is reported as a 7 /10. Location: Foot Right/denies. Onset: More than a month ago. Quality: Sharp, Other (Comment) (pulling). Timing: Constant. Modifying factor(s): sitting. Vitals:  height is 5' (1.524 m) and weight is 171 lb (77.6 kg). Her temporal temperature is 97.1 F (36.2 C) (abnormal). Her blood pressure is 138/58 (abnormal) and her pulse is 63. Her respiration is 17 and oxygen saturation is 98%.    Reason for encounter: medication management.   -Presents for medication management. -She has lost over 20 pounds with dieting and Ozempic. -She is states that her overall pain has reduced specifically in both of her knees. -We discussed weaning her hydrocodone from a total daily dose of 30 mg to 20 mg given that she is experiencing improved pain control specifically with weight loss. -Of note, she plans to move to [place] at the end of this month to be closer to her son.  She currently lives in [place].  Pharmacotherapy Assessment  Analgesic: Norco 5 mg QID prn   Monitoring: [place] PMP: PDMP reviewed during this encounter.       Pharmacotherapy: No side-effects or adverse reactions reported. Compliance: No problems identified. Effectiveness: Clinically acceptable.  [name], RN  08/20/2022 11:24 AM  Sign when Signing Visit Nursing Pain Medication Assessment:  Safety precautions to be maintained throughout the outpatient stay will include: orient to surroundings, keep bed in low position, maintain call bell within reach at all times, provide assistance with transfer out of bed and ambulation.  Medication Inspection Compliance: Pill count conducted under aseptic conditions, in front of the patient. Neither the pills nor the bottle was removed from the patient's sight at any time. Once count was completed pills were immediately returned to the patient in their original bottle.  Medication: Hydrocodone/APAP Pill/Patch Count:  45.5 of 90 pills remain Pill/Patch Appearance: Markings consistent with prescribed medication Bottle Appearance: Standard pharmacy container. Clearly labeled. Filled Date: 10 / 02/ 2023 Last Medication intake:  Today     UDS:  Summary  Date Value Ref Range Status  05/26/2022 Note  Final    Comment:    ==================================================================== [facility]  (MW) ==================================================================== Test  Result       Flag       Units  Drug Present and Declared for Prescription Verification   Hydrocodone                    4270         EXPECTED   ng/mg creat   Hydromorphone                  287          EXPECTED   ng/mg creat   Dihydrocodeine                 570          EXPECTED   ng/mg creat   Norhydrocodone                 3017         EXPECTED   ng/mg creat    Sources of hydrocodone include scheduled prescription medications.    Hydromorphone, dihydrocodeine and norhydrocodone are expected    metabolites of hydrocodone. Hydromorphone and dihydrocodeine are    also available as scheduled prescription medications.  ==================================================================== Test                      Result    Flag   Units      Ref Range   Creatinine              46               mg/dL      >=20 ==================================================================== Declared Medications:  The flagging and interpretation on this report are based on the  following declared medications.  Unexpected results may arise from  inaccuracies in the declared medications.   **Note: The testing scope of this panel includes these medications:   Hydrocodone (Norco)   **Note: The testing scope of this panel does not include the  following reported medications:   Acetaminophen (Tylenol)  Acetaminophen (Norco)  Alendronate (Fosamax)  Amlodipine (Norvasc)  Aripiprazole (Abilify)  Benztropine (Cogentin)  Escitalopram (Lexapro)  Furosemide (Lasix)  Glimepiride (Amaryl)  Hydroxyzine (Vistaril)  Insulin (Lantus)  Lactulose (Chronulac)  Lamotrigine (Lamictal)  Linaclotide (Linzess)  Meclizine (Antivert)  Metformin (Glucophage)  Pantoprazole (Protonix)  Semaglutide (Ozempic)  Spironolactone (Aldactone)  Tizanidine (Zanaflex)  Vitamin B  Vitamin  C ==================================================================== For clinical consultation, please call [phone]. ====================================================================      ROS  Constitutional: Denies any fever or chills Gastrointestinal: No reported hemesis, hematochezia, vomiting, or acute GI distress Musculoskeletal:  Left hip and SI-J pain Neurological: No reported episodes of acute onset apraxia, aphasia, dysarthria, agnosia, amnesia, paralysis, loss of coordination, or loss of consciousness  Medication Review  ARIPiprazole, HYDROcodone-acetaminophen, Insulin Glargine w/ Trans Port, Insulin Pen Needle, Semaglutide (1 MG/DOSE), acetaminophen, alendronate, amLODipine, benztropine, escitalopram, furosemide, glimepiride, glucose blood, hydrOXYzine, insulin glargine, lamoTRIgine, meclizine, metFORMIN, pantoprazole, polyethylene glycol powder, rifaximin, spironolactone, and tizanidine  History Review  Allergy: Ms. Kniskern is allergic to lithium, penicillins, and sulfa antibiotics. Drug: Ms. Smelcer  reports that she does not currently use drugs. Alcohol:  reports that she does not currently use alcohol. Tobacco:  reports that she quit smoking about 11 years ago. Her smoking use included cigarettes. She has never used smokeless tobacco. Social: Ms. Raval  reports that she quit smoking about 11 years ago. Her smoking use included cigarettes. She has never used smokeless tobacco. She reports that she does not currently use alcohol. She  reports that she does not currently use drugs. Medical:  has a past medical history of Bipolar 1 disorder ([place]), Diabetes mellitus without complication ([place]), and Hypertension. Surgical: Ms. Kluttz  has a past surgical history that includes Abdominal hysterectomy; Tonsillectomy; Cholecystectomy; Esophagogastroduodenoscopy (egd) with propofol (N/A, 06/23/2019); and Colonoscopy with propofol (N/A, 06/23/2019). Family: family history  includes Bipolar disorder in her brother, paternal uncle, and paternal uncle; Mental illness in her father.  Laboratory Chemistry Profile   Renal Lab Results  Component Value Date   BUN 11 12/06/2019   CREATININE 0.59 12/06/2019   BCR 19 12/06/2019   GFRAA 114 12/06/2019   GFRNONAA 99 12/06/2019    Hepatic Lab Results  Component Value Date   AST 48 (H) 12/06/2019   ALT 47 (H) 12/06/2019   ALBUMIN 3.7 (L) 12/06/2019   ALKPHOS 122 (H) 12/06/2019    Electrolytes Lab Results  Component Value Date   NA 137 12/06/2019   K 3.9 12/06/2019   CL 101 12/06/2019   CALCIUM 9.7 12/06/2019    Bone No results found for: "VD25OH", "VD125OH2TOT", "KZ6010XN2", "TF5732KG2", "25OHVITD1", "25OHVITD2", "25OHVITD3", "TESTOFREE", "TESTOSTERONE"  Inflammation (CRP: Acute Phase) (ESR: Chronic Phase) No results found for: "CRP", "ESRSEDRATE", "LATICACIDVEN"       Note: Above Lab results reviewed.   Physical Exam  General appearance: Well nourished, well developed, and well hydrated. In no apparent acute distress Mental status: Alert, oriented x 3 (person, place, & time)       Respiratory: No evidence of acute respiratory distress Eyes: PERLA Vitals: BP (!) 138/58   Pulse 63   Temp (!) 97.1 F (36.2 C) (Temporal)   Resp 17   Ht 5' (1.524 m)   Wt 171 lb (77.6 kg)   SpO2 98%   BMI 33.40 kg/m  BMI: Estimated body mass index is 33.4 kg/m as calculated from the following:   Height as of this encounter: 5' (1.524 m).   Weight as of this encounter: 171 lb (77.6 kg). Ideal: Ideal body weight: 45.5 kg (100 lb 4.9 oz) Adjusted ideal body weight: 58.3 kg (128 lb 9.4 oz)  Lumbar Spine Area Exam  Skin & Axial Inspection: No masses, redness, or swelling Alignment: Symmetrical Functional ROM: Pain restricted ROM       Stability: No instability detected Muscle Tone/Strength: Functionally intact. No obvious neuro-muscular anomalies detected. Sensory (Neurological): Musculoskeletal pain  pattern  Lower Extremity Exam    Side: Right lower extremity  Side: Left lower extremity  Stability: No instability observed          Stability: No instability observed          Skin & Extremity Inspection: Skin color, temperature, and hair growth are WNL. No peripheral edema or cyanosis. No masses, redness, swelling, asymmetry, or associated skin lesions. No contractures.  Skin & Extremity Inspection:  Left ankle in brace  Functional ROM: Unrestricted ROM                  Functional ROM: Pain restricted ROM                  Muscle Tone/Strength: Functionally intact. No obvious neuro-muscular anomalies detected.  Muscle Tone/Strength: Functionally intact. No obvious neuro-muscular anomalies detected.  Sensory (Neurological): Unimpaired        Sensory (Neurological): Arthropathic arthralgia        DTR: Patellar: deferred today Achilles: deferred today Plantar: deferred today  DTR: Patellar: deferred today Achilles: deferred today Plantar: deferred today  Palpation: No palpable anomalies  Palpation: No palpable anomalies    Assessment   Status Diagnosis  Controlled Controlled Controlled 1. Piriformis syndrome, left   2. Chronic left SI joint pain   3. SI (sacroiliac) joint dysfunction   4. Sacroiliac joint pain   5. Trochanteric bursitis of right hip   6. Trochanteric bursitis of left hip   7. Chronic pain syndrome      Updated Problems: No problems updated.   Plan of Care    Ms. Abri Vacca has a current medication list which includes the following long-term medication(s): aripiprazole, benztropine, escitalopram, furosemide, glimepiride, lamotrigine, metformin, pantoprazole, and spironolactone.  Pharmacotherapy (Medications Ordered): Meds ordered this encounter  Medications   HYDROcodone-acetaminophen (NORCO/VICODIN) 5-325 MG tablet    Sig: Take 1 tablet by mouth every 6 (six) hours as needed for severe pain. Must last 30 days.    Dispense:  120 tablet    Refill:  0     Chronic Pain: STOP Act (Not applicable) Fill 1 day early if closed on refill date. Avoid benzodiazepines within 8 hours of opioids   HYDROcodone-acetaminophen (NORCO/VICODIN) 5-325 MG tablet    Sig: Take 1 tablet by mouth every 6 (six) hours as needed for severe pain. Must last 30 days.    Dispense:  120 tablet    Refill:  0    Chronic Pain: STOP Act (Not applicable) Fill 1 day early if closed on refill date. Avoid benzodiazepines within 8 hours of opioids   HYDROcodone-acetaminophen (NORCO/VICODIN) 5-325 MG tablet    Sig: Take 1 tablet by mouth every 6 (six) hours as needed for severe pain. Must last 30 days.    Dispense:  120 tablet    Refill:  0    Chronic Pain: STOP Act (Not applicable) Fill 1 day early if closed on refill date. Avoid benzodiazepines within 8 hours of opioids   Encourage patient to continue with dieting and weight loss strategies.  Orders:  No orders of the defined types were placed in this encounter.  Follow-up plan:   Return in about 14 weeks (around 11/26/2022) for Medication Management, in person.     Interventional management options: Planned, scheduled, and/or pending:    Lumbar epidural steroid injection at L4-L5 without sedation Intra-articular knee Hyalgan series #1 done 08/28/20 helped significantly (100% pain relief for 4 months), repeat as needed left hip greater trochanteric bursa injection with local only.   Considering:   Lumbar epidural steroid injection, lumbar facet medial branch nerve block pending lumbar MRI   PRN Procedures:   Repeat bilateral intra-articular Hyalgan injection         Recent Visits Date Type Provider Dept  05/26/22 Office Visit Gillis Santa, MD [facility]  Showing recent visits within past 90 days and meeting all other requirements Today's Visits Date Type Provider Dept  08/20/22 Office Visit Gillis Santa, MD [facility]  Showing today's visits and meeting all other requirements Future  Appointments No visits were found meeting these conditions. Showing future appointments within next 90 days and meeting all other requirements  I discussed the assessment and treatment plan with the patient. The patient was provided an opportunity to ask questions and all were answered. The patient agreed with the plan and demonstrated an understanding of the instructions.  Patient advised to call back or seek an in-person evaluation if the symptoms or condition worsens.  Duration of encounter: 44mnutes.  Note by: BGillis Santa MD Date: 08/20/2022; Time: 11:38 AM

## 2022-08-20 NOTE — Progress Notes (Signed)
Nursing Pain Medication Assessment:  Safety precautions to be maintained throughout the outpatient stay will include: orient to surroundings, keep bed in low position, maintain call bell within reach at all times, provide assistance with transfer out of bed and ambulation.  Medication Inspection Compliance: Pill count conducted under aseptic conditions, in front of the patient. Neither the pills nor the bottle was removed from the patient's sight at any time. Once count was completed pills were immediately returned to the patient in their original bottle.  Medication: Hydrocodone/APAP Pill/Patch Count:  45.5 of 90 pills remain Pill/Patch Appearance: Markings consistent with prescribed medication Bottle Appearance: Standard pharmacy container. Clearly labeled. Filled Date: 10 / 02/ 2023 Last Medication intake:  Today

## 2022-08-31 ENCOUNTER — Other Ambulatory Visit: Payer: Self-pay

## 2022-08-31 ENCOUNTER — Telehealth: Payer: Self-pay | Admitting: Student in an Organized Health Care Education/Training Program

## 2022-08-31 DIAGNOSIS — G894 Chronic pain syndrome: Secondary | ICD-10-CM

## 2022-08-31 NOTE — Telephone Encounter (Signed)
Refill request sent to Dr Lateef.  

## 2022-08-31 NOTE — Telephone Encounter (Signed)
PT stated that she needs her hydrocodone 5-'325mg'$  send to [facility] in [place]. Please give patient a call. Thanks

## 2022-09-01 MED ORDER — HYDROCODONE-ACETAMINOPHEN 5-325 MG PO TABS: 1.0000 | ORAL_TABLET | Freq: Four times a day (QID) | ORAL | 0 refills | Status: DC | PRN

## 2022-09-02 ENCOUNTER — Encounter: Payer: Self-pay | Admitting: Student in an Organized Health Care Education/Training Program

## 2022-09-03 NOTE — Telephone Encounter (Signed)
Patient called and states she will use the current pharmacy for her C2 scripts and the next time she comes in for med refill, we can switch pharmacies. Instructed to call for any problems she encounters either getting or being able to pick up her meds at the current pharmacy.

## 2022-09-07 ENCOUNTER — Other Ambulatory Visit: Payer: Self-pay | Admitting: *Deleted

## 2022-09-07 ENCOUNTER — Telehealth: Payer: Self-pay | Admitting: Student in an Organized Health Care Education/Training Program

## 2022-09-07 DIAGNOSIS — G894 Chronic pain syndrome: Secondary | ICD-10-CM

## 2022-09-07 MED ORDER — HYDROCODONE-ACETAMINOPHEN 5-325 MG PO TABS: 1.0000 | ORAL_TABLET | Freq: Four times a day (QID) | ORAL | 0 refills | Status: DC | PRN

## 2022-09-07 NOTE — Telephone Encounter (Signed)
Medication refill request sent to [facility]

## 2022-09-07 NOTE — Telephone Encounter (Signed)
PT stated that she need her prescription refill at [facility] in [place]. PT stated that the pharmacy that she was using before moving her will no longer refill her prescription or forward prescription to another pharmacy. Please give patient a call. Thanks

## 2022-09-08 ENCOUNTER — Ambulatory Visit: Admission: RE | Admit: 2022-09-08 | Payer: Medicaid Other | Source: Home / Self Care

## 2022-09-08 ENCOUNTER — Encounter: Admission: RE | Payer: Self-pay | Source: Home / Self Care

## 2022-09-08 SURGERY — ESOPHAGOGASTRODUODENOSCOPY (EGD) WITH PROPOFOL
Anesthesia: General

## 2022-10-05 ENCOUNTER — Telehealth: Payer: Self-pay | Admitting: Student in an Organized Health Care Education/Training Program

## 2022-10-05 ENCOUNTER — Other Ambulatory Visit: Payer: Self-pay | Admitting: Student in an Organized Health Care Education/Training Program

## 2022-10-05 DIAGNOSIS — G894 Chronic pain syndrome: Secondary | ICD-10-CM

## 2022-10-05 NOTE — Telephone Encounter (Addendum)
Patient called [facility] and was told they do not have a script. In her chart she has [facility] She has moved to [place] and they will not fill script. I explained Dr Holley Raring is not here. She say [facility] not [facility] since she moved. Can someone help patient get this straight and call her back please

## 2022-10-05 NOTE — Telephone Encounter (Signed)
[facility], spoke with pharmacist told me that patient has transferred all her other prescriptions to [facility], they will not fill only opioids. Rx request sent to Dr. Dossie Arbour.

## 2022-10-06 MED ORDER — HYDROCODONE-ACETAMINOPHEN 5-325 MG PO TABS: 1.0000 | ORAL_TABLET | Freq: Four times a day (QID) | ORAL | 0 refills | Status: AC | PRN

## 2022-10-06 MED ORDER — HYDROCODONE-ACETAMINOPHEN 5-325 MG PO TABS: 1.0000 | ORAL_TABLET | Freq: Four times a day (QID) | ORAL | 0 refills | Status: DC | PRN

## 2022-10-06 NOTE — Telephone Encounter (Signed)
Attempted to call patient, no answer, unable to leave a message

## 2022-10-13 ENCOUNTER — Ambulatory Visit (INDEPENDENT_AMBULATORY_CARE_PROVIDER_SITE_OTHER): Payer: Medicaid Other | Admitting: Psychiatry

## 2022-10-13 ENCOUNTER — Encounter: Payer: Self-pay | Admitting: Psychiatry

## 2022-10-13 VITALS — BP 150/73 | HR 82 | Temp 97.8°F | Ht 60.0 in | Wt 174.0 lb

## 2022-10-13 DIAGNOSIS — F3176 Bipolar disorder, in full remission, most recent episode depressed: Secondary | ICD-10-CM | POA: Diagnosis not present

## 2022-10-13 DIAGNOSIS — Z79899 Other long term (current) drug therapy: Secondary | ICD-10-CM | POA: Diagnosis not present

## 2022-10-13 DIAGNOSIS — F418 Other specified anxiety disorders: Secondary | ICD-10-CM | POA: Diagnosis not present

## 2022-10-13 DIAGNOSIS — M818 Other osteoporosis without current pathological fracture: Secondary | ICD-10-CM | POA: Insufficient documentation

## 2022-10-13 MED ORDER — ARIPIPRAZOLE 10 MG PO TABS: 10.0000 mg | ORAL_TABLET | Freq: Every day | ORAL | 0 refills | Status: DC

## 2022-10-13 MED ORDER — LAMOTRIGINE 200 MG PO TABS: 200.0000 mg | ORAL_TABLET | Freq: Every day | ORAL | 0 refills | Status: DC

## 2022-10-13 MED ORDER — ESCITALOPRAM OXALATE 10 MG PO TABS: 10.0000 mg | ORAL_TABLET | Freq: Every day | ORAL | 0 refills | Status: DC

## 2022-10-13 MED ORDER — BENZTROPINE MESYLATE 0.5 MG PO TABS: 0.5000 mg | ORAL_TABLET | Freq: Every day | ORAL | 1 refills | Status: DC | PRN
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
## 2022-10-14 ENCOUNTER — Other Ambulatory Visit: Payer: Self-pay | Admitting: Internal Medicine

## 2022-10-14 DIAGNOSIS — R918 Other nonspecific abnormal finding of lung field: Secondary | ICD-10-CM

## 2022-10-14 DIAGNOSIS — E119 Type 2 diabetes mellitus without complications: Secondary | ICD-10-CM

## 2022-10-20 ENCOUNTER — Encounter: Payer: Self-pay | Admitting: Oncology

## 2022-10-27 ENCOUNTER — Other Ambulatory Visit: Payer: Medicaid Other

## 2022-10-27 ENCOUNTER — Encounter: Payer: Medicaid Other | Admitting: Internal Medicine

## 2022-10-28 ENCOUNTER — Inpatient Hospital Stay: Payer: Medicaid Other | Attending: Internal Medicine | Admitting: Internal Medicine

## 2022-10-28 ENCOUNTER — Inpatient Hospital Stay: Payer: Medicaid Other

## 2022-10-28 ENCOUNTER — Encounter: Payer: Self-pay | Admitting: Internal Medicine

## 2022-10-28 DIAGNOSIS — D696 Thrombocytopenia, unspecified: Secondary | ICD-10-CM | POA: Diagnosis not present

## 2022-10-28 DIAGNOSIS — Z79899 Other long term (current) drug therapy: Secondary | ICD-10-CM | POA: Insufficient documentation

## 2022-10-28 DIAGNOSIS — R161 Splenomegaly, not elsewhere classified: Secondary | ICD-10-CM | POA: Diagnosis not present

## 2022-10-28 DIAGNOSIS — K7460 Unspecified cirrhosis of liver: Secondary | ICD-10-CM | POA: Insufficient documentation

## 2022-10-28 DIAGNOSIS — Z862 Personal history of diseases of the blood and blood-forming organs and certain disorders involving the immune mechanism: Secondary | ICD-10-CM | POA: Diagnosis not present

## 2022-10-28 DIAGNOSIS — R188 Other ascites: Secondary | ICD-10-CM | POA: Diagnosis not present

## 2022-10-28 DIAGNOSIS — Z87891 Personal history of nicotine dependence: Secondary | ICD-10-CM | POA: Insufficient documentation

## 2022-10-28 DIAGNOSIS — D649 Anemia, unspecified: Secondary | ICD-10-CM | POA: Insufficient documentation

## 2022-10-28 DIAGNOSIS — K760 Fatty (change of) liver, not elsewhere classified: Secondary | ICD-10-CM | POA: Insufficient documentation

## 2022-10-28 NOTE — Assessment & Plan Note (Addendum)
# [  2020] History of iron deficiency anemia secondary to chronic GI bleed/cirrhosis.  JULY 2023- Iron sat 17; Ferritin 28- wnl-PCP; Hb 9-10.   Patient recommend continue taking oral iron/MiraLAX once a day.  Is tolerating well. As patient is asymptomatic would recommend IV iron infusions.   Discussed the potential acute infusion reactions with IV iron; which are quite rare.  Patient understands the risk; will proceed with infusions.  # Etiology: Likely secondary to iron deficiency/cirrhosis chronic GI bleed.  Awaiting evaluation with upper and lower endoscopy in February 6th, 2024. ? Capsule study.   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  Monitor follow-up.  #Liver cirrhosis, splenomegaly, small volume ascites.  Patient does not drink alcohol.? NAFLD-check AFP at next visit.  Patient will need screening ultrasounds [? [facility]]-  Thank you Dr. Sabra Heck for allowing me to participate in the care of your pleasant patient. Please do not hesitate to contact me with questions or concerns in the interim.  # DISPOSITION: # NO labs today # venofer weekly x4 - start ASAP # follow up in 2 months-; MD; labs- cbc/cmp; iron studies; ferrititin; AFP; posisble venofer Dr.B

## 2022-10-28 NOTE — Progress Notes (Signed)
Margaret [name] NOTE  Patient Care Team: Rusty Aus, MD as PCP - General (Internal Medicine) Margaret Server, MD as Consulting Physician (Oncology)  CHIEF COMPLAINTS/PURPOSE OF CONSULTATION: Anemia/thrombocytopenia   HEMATOLOGY HISTORY  06/21/2019-06/24/2019 due to severe anemia; hemoglobin of 5.7 upon admission; patient received 2 units of blood transfusion, 3 doses of IV iron. EGD and colonoscopy were performed during the hospitalization EGD and colonoscopy on 06/23/2019. [Dr.Anna; [facility]] EGD showed no abnormalities and the colonoscopy showed 3 sessile diminutive polyps which were resected completely.  Arteries of the duodenum were normal.  3 colon polyps resected were serrated polyp x2 and a tubular adenoma x1. No active source of bleeding was noted.    Patient was scheduled for capsule endoscopy as outpatient?   # Cirrhosis NAFLD- [? [facility]]  HISTORY OF PRESENTING ILLNESS: ambulating with rolling walker [arthritis].   Margaret [name] 66 y.o.  female pleasant patient was been with a history of cirrhosis/nonalcoholic fatty liver referred to [place] for further evaluation of anemia/thrombocytopenia.  Patient noted to be fatigued in the last many months.  Patient started back on oral iron recently.  History of constipation currently improved with MiraLAX.  Patient denies any obvious blood in stool or black-colored stool.  Mild easy bruising not any worse.  No bleeding gums or nose.  Complains of cold intolerance.    Review of Systems  Constitutional:  Positive for malaise/fatigue. Negative for chills, diaphoresis, fever and weight loss.  HENT:  Negative for nosebleeds and sore throat.   Eyes:  Negative for double vision.  Respiratory:  Positive for shortness of breath. Negative for cough, hemoptysis, sputum production and wheezing.   Cardiovascular:  Negative for chest pain, palpitations, orthopnea and leg swelling.  Gastrointestinal:  Negative for abdominal pain, blood in stool,  constipation, diarrhea, heartburn, melena, nausea and vomiting.  Genitourinary:  Negative for dysuria, frequency and urgency.  Musculoskeletal:  Positive for back pain and joint pain.  Skin: Negative.  Negative for itching and rash.  Neurological:  Negative for dizziness, tingling, focal weakness, weakness and headaches.  Endo/Heme/Allergies:  Does not bruise/bleed easily.  Psychiatric/Behavioral:  Negative for depression. The patient is not nervous/anxious and does not have insomnia.      MEDICAL HISTORY:  Past Medical History:  Diagnosis Date   Bipolar 1 disorder ([place])    Diabetes mellitus without complication ([place])    Type 2   Hypertension     SURGICAL HISTORY: Past Surgical History:  Procedure Laterality Date   ABDOMINAL HYSTERECTOMY     CHOLECYSTECTOMY     COLONOSCOPY WITH PROPOFOL N/A 06/23/2019   Procedure: COLONOSCOPY WITH PROPOFOL;  Surgeon: Jonathon Bellows, MD;  Location: [facility] ENDOSCOPY;  Service: Gastroenterology;  Laterality: N/A;   ESOPHAGOGASTRODUODENOSCOPY (EGD) WITH PROPOFOL N/A 06/23/2019   Procedure: ESOPHAGOGASTRODUODENOSCOPY (EGD) WITH PROPOFOL;  Surgeon: Jonathon Bellows, MD;  Location: [facility] ENDOSCOPY;  Service: Gastroenterology;  Laterality: N/A;   TONSILLECTOMY      SOCIAL HISTORY: Social History   Socioeconomic History   Marital status: Divorced    Spouse name: Not on file   Number of children: 2   Years of education: Not on file   Highest education level: Not on file  Occupational History   Not on file  Tobacco Use   Smoking status: Former    Types: Cigarettes    Quit date: 03/03/2011    Years since quitting: 11.6   Smokeless tobacco: Never  Vaping Use   Vaping Use: Never used  Substance and Sexual Activity  Alcohol use: Not Currently   Drug use: Not Currently   Sexual activity: Not Currently  Other Topics Concern   Not on file  Social History Narrative   Not on file   Social Determinants of Health   Financial Resource Strain: Not on file   Food Insecurity: Not on file  Transportation Needs: Not on file  Physical Activity: Not on file  Stress: Not on file  Social Connections: Not on file  Intimate Partner Violence: Not on file    FAMILY HISTORY: Family History  Problem Relation Age of Onset   Mental illness Father    Bipolar disorder Brother    Bipolar disorder Paternal Uncle    Bipolar disorder Paternal Uncle     ALLERGIES:  is allergic to lithium, penicillins, sulfa antibiotics, and benzonatate.  MEDICATIONS:  Current Outpatient Medications  Medication Sig Dispense Refill   acetaminophen (TYLENOL) 500 MG tablet Take 500 mg by mouth every 6 (six) hours as needed.      alendronate (FOSAMAX) 35 MG tablet Take 35 mg by mouth every 7 (seven) days. Take with a full glass of water on an empty stomach.     amLODipine (NORVASC) 2.5 MG tablet Take by mouth.     ARIPiprazole (ABILIFY) 10 MG tablet Take 1 tablet (10 mg total) by mouth daily. 90 tablet 0   benztropine (COGENTIN) 0.5 MG tablet Take 1 tablet (0.5 mg total) by mouth daily as needed for tremors. 15 tablet 1   escitalopram (LEXAPRO) 10 MG tablet Take 1 tablet (10 mg total) by mouth daily. 90 tablet 0   furosemide (LASIX) 20 MG tablet Take 10 mg by mouth daily as needed.     glucose blood (PRECISION QID TEST) test strip 1 each.     HYDROcodone-acetaminophen (NORCO/VICODIN) 5-325 MG tablet TAKE ONE TABLET BY MOUTH EVERY 6 HOURS AS NEEDED FOR SEVERE PAIN. (MUST LAST 30 DAYS) (09/07/2022) 120 tablet 0   HYDROcodone-acetaminophen (NORCO/VICODIN) 5-325 MG tablet Take 1 tablet by mouth every 6 (six) hours as needed for severe pain. Must last 30 days. 120 tablet 0   [START ON 11/01/2022] HYDROcodone-acetaminophen (NORCO/VICODIN) 5-325 MG tablet Take 1 tablet by mouth every 6 (six) hours as needed for severe pain. Must last 30 days. 120 tablet 0   hydrOXYzine (VISTARIL) 25 MG capsule Take 25 mg by mouth daily as needed for anxiety.     insulin glargine (LANTUS) 100 UNIT/ML  Solostar Pen Inject into the skin.     Insulin Glargine w/ Trans Port 100 UNIT/ML SOPN Inject into the skin.     Insulin Glargine w/ Trans Port 100 UNIT/ML SOPN Inject into the skin.     lamoTRIgine (LAMICTAL) 200 MG tablet Take 1 tablet (200 mg total) by mouth daily. 90 tablet 0   meclizine (ANTIVERT) 25 MG tablet Take 25 mg by mouth 2 (two) times daily as needed.     metFORMIN (GLUCOPHAGE) 500 MG tablet Take 500 mg by mouth 2 (two) times daily with a meal.     OZEMPIC, 1 MG/DOSE, 4 MG/3ML SOPN Inject 1 mg into the skin once a week.     pantoprazole (PROTONIX) 40 MG tablet Take 40 mg by mouth daily.     polyethylene glycol powder (GLYCOLAX/MIRALAX) 17 GM/SCOOP powder SMARTSIG:17 Gram(s) By Mouth 1-2 Times Daily     rifaximin (XIFAXAN) 550 MG TABS tablet Take by mouth.     spironolactone (ALDACTONE) 25 MG tablet Take 25 mg by mouth daily.     SURE COMFORT  PEN NEEDLES 31G X 8 MM MISC USE AS DIRECTED TWICEEDAILY.T     tizanidine (ZANAFLEX) 2 MG capsule Take 2 mg by mouth 3 (three) times daily as needed for muscle spasms. Takes differently     glimepiride (AMARYL) 2 MG tablet Take 2 mg by mouth daily with breakfast.  (Patient not taking: Reported on 10/28/2022)     No current facility-administered medications for this visit.     PHYSICAL EXAMINATION:   Vitals:   10/28/22 1049  BP: (!) 122/39  Pulse: 81  Resp: 20  Temp: (!) 96.6 F (35.9 C)  SpO2: 100%   Filed Weights   10/28/22 1049  Weight: 174 lb 3.2 oz (79 kg)    Physical Exam Vitals and nursing note reviewed.  HENT:     Head: Normocephalic and atraumatic.     Mouth/Throat:     Pharynx: Oropharynx is clear.  Eyes:     Extraocular Movements: Extraocular movements intact.     Pupils: Pupils are equal, round, and reactive to light.  Cardiovascular:     Rate and Rhythm: Normal rate and regular rhythm.  Pulmonary:     Comments: Decreased breath sounds bilaterally.  Abdominal:     Palpations: Abdomen is soft.   Musculoskeletal:        General: Normal range of motion.     Cervical back: Normal range of motion.  Skin:    General: Skin is warm.  Neurological:     General: No focal deficit present.     Mental Status: She is alert and oriented to person, place, and time.  Psychiatric:        Behavior: Behavior normal.        Judgment: Judgment normal.      LABORATORY DATA:  I have reviewed the data as listed Lab Results  Component Value Date   WBC 6.7 11/13/2019   HGB 13.0 11/13/2019   HCT 40.6 11/13/2019   MCV 90.8 11/13/2019   PLT 117 (L) 11/13/2019   No results for input(s): "NA", "K", "CL", "CO2", "GLUCOSE", "BUN", "CREATININE", "CALCIUM", "GFRNONAA", "GFRAA", "PROT", "ALBUMIN", "AST", "ALT", "ALKPHOS", "BILITOT", "BILIDIR", "IBILI" in the last 8760 hours.   No results found.  ASSESSMENT & PLAN:   Symptomatic anemia # [2020] History of iron deficiency anemia secondary to chronic GI bleed/cirrhosis.  JULY 2023- Iron sat 17; Ferritin 28- wnl-PCP; Hb 9-10.   Patient recommend continue taking oral iron/MiraLAX once a day.  Is tolerating well. As patient is asymptomatic would recommend IV iron infusions.   Discussed the potential acute infusion reactions with IV iron; which are quite rare.  Patient understands the risk; will proceed with infusions.  # Etiology: Likely secondary to iron deficiency/cirrhosis chronic GI bleed.  Awaiting evaluation with upper and lower endoscopy in February 6th, 2024. ? Capsule study.   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  Monitor follow-up.  #Liver cirrhosis, splenomegaly, small volume ascites.  Patient does not drink alcohol.? NAFLD-check AFP at next visit.  Patient will need screening ultrasounds [? [facility]]-  Thank you Dr. Sabra Heck for allowing me to participate in the care of your pleasant patient. Please do not hesitate to contact me with questions or concerns in the interim.  # DISPOSITION: # NO labs today # venofer  weekly x4 - start ASAP # follow up in 2 months-; MD; labs- cbc/cmp; iron studies; ferrititin; AFP; posisble venofer Dr.B    All questions were answered. The patient knows to call the clinic with any problems, questions or  concerns.    Cammie Sickle, MD 10/28/2022 12:16 PM            Did find the reason for

## 2022-10-30 ENCOUNTER — Encounter: Payer: Self-pay | Admitting: Internal Medicine

## 2022-11-02 ENCOUNTER — Encounter: Payer: Self-pay | Admitting: Internal Medicine

## 2022-11-04 ENCOUNTER — Ambulatory Visit
Admission: RE | Admit: 2022-11-04 | Discharge: 2022-11-04 | Disposition: A | Discharge status: Home / Self Care | Payer: Medicaid Other | Source: Ambulatory Visit | Attending: Internal Medicine | Admitting: Internal Medicine

## 2022-11-04 DIAGNOSIS — I2510 Atherosclerotic heart disease of native coronary artery without angina pectoris: Secondary | ICD-10-CM

## 2022-11-04 DIAGNOSIS — R918 Other nonspecific abnormal finding of lung field: Secondary | ICD-10-CM | POA: Insufficient documentation

## 2022-11-04 DIAGNOSIS — E119 Type 2 diabetes mellitus without complications: Secondary | ICD-10-CM | POA: Insufficient documentation

## 2022-11-04 DIAGNOSIS — Z794 Long term (current) use of insulin: Secondary | ICD-10-CM | POA: Insufficient documentation

## 2022-11-04 HISTORY — DX: Atherosclerotic heart disease of native coronary artery without angina pectoris: I25.10

## 2022-11-04 MED ORDER — IOHEXOL 300 MG/ML  SOLN
100.0000 mL | Freq: Once | INTRAMUSCULAR | Status: AC | PRN
  Administered 2022-11-04: 100 mL via INTRAVENOUS

## 2022-11-06 ENCOUNTER — Inpatient Hospital Stay: Payer: Medicaid Other | Attending: Internal Medicine

## 2022-11-06 ENCOUNTER — Encounter: Payer: Self-pay | Admitting: Internal Medicine

## 2022-11-06 VITALS — BP 114/42 | HR 77 | Resp 16

## 2022-11-06 DIAGNOSIS — K922 Gastrointestinal hemorrhage, unspecified: Secondary | ICD-10-CM | POA: Diagnosis present

## 2022-11-06 DIAGNOSIS — D500 Iron deficiency anemia secondary to blood loss (chronic): Secondary | ICD-10-CM

## 2022-11-06 MED ORDER — SODIUM CHLORIDE 0.9 % IV SOLN
Freq: Once | INTRAVENOUS | Status: AC
  Filled 2022-11-06: qty 250

## 2022-11-06 MED ORDER — SODIUM CHLORIDE 0.9 % IV SOLN
200.0000 mg | Freq: Once | INTRAVENOUS | Status: AC
  Administered 2022-11-06: 200 mg via INTRAVENOUS
  Filled 2022-11-06: qty 200

## 2022-11-10 ENCOUNTER — Encounter: Payer: Self-pay | Admitting: Internal Medicine

## 2022-11-12 ENCOUNTER — Ambulatory Visit
Payer: Medicare Other | Attending: Student in an Organized Health Care Education/Training Program | Admitting: Student in an Organized Health Care Education/Training Program

## 2022-11-12 ENCOUNTER — Encounter: Payer: Self-pay | Admitting: Student in an Organized Health Care Education/Training Program

## 2022-11-12 VITALS — BP 136/47 | HR 70 | Temp 97.4°F | Ht 60.0 in | Wt 174.0 lb

## 2022-11-12 DIAGNOSIS — M47816 Spondylosis without myelopathy or radiculopathy, lumbar region: Secondary | ICD-10-CM

## 2022-11-12 DIAGNOSIS — M5410 Radiculopathy, site unspecified: Secondary | ICD-10-CM | POA: Diagnosis present

## 2022-11-12 DIAGNOSIS — G894 Chronic pain syndrome: Secondary | ICD-10-CM

## 2022-11-12 DIAGNOSIS — M170 Bilateral primary osteoarthritis of knee: Secondary | ICD-10-CM | POA: Diagnosis not present

## 2022-11-12 DIAGNOSIS — M5412 Radiculopathy, cervical region: Secondary | ICD-10-CM

## 2022-11-12 DIAGNOSIS — Z96642 Presence of left artificial hip joint: Secondary | ICD-10-CM | POA: Diagnosis present

## 2022-11-12 DIAGNOSIS — G8929 Other chronic pain: Secondary | ICD-10-CM | POA: Diagnosis present

## 2022-11-12 DIAGNOSIS — Z79891 Long term (current) use of opiate analgesic: Secondary | ICD-10-CM

## 2022-11-12 DIAGNOSIS — M5416 Radiculopathy, lumbar region: Secondary | ICD-10-CM | POA: Diagnosis present

## 2022-11-12 MED ORDER — HYDROCODONE-ACETAMINOPHEN 5-325 MG PO TABS: 1.0000 | ORAL_TABLET | Freq: Four times a day (QID) | ORAL | 0 refills | Status: DC | PRN

## 2022-11-12 MED ORDER — HYDROCODONE-ACETAMINOPHEN 5-325 MG PO TABS: 1.0000 | ORAL_TABLET | Freq: Four times a day (QID) | ORAL | 0 refills | Status: AC | PRN

## 2022-11-12 MED FILL — Iron Sucrose Inj 20 MG/ML (Fe Equiv): INTRAVENOUS | Qty: 10 | Status: AC

## 2022-11-12 NOTE — Progress Notes (Signed)
PROVIDER NOTE: Information contained herein reflects review and annotations entered in association with encounter. Interpretation of such information and data should be left to medically-trained personnel. Information provided to patient can be located elsewhere in the medical record under "Patient Instructions". Document created using STT-dictation technology, any transcriptional errors that may result from process are unintentional.    Patient: Margaret [name]  Service Category: E/M  Provider: Gillis Santa, MD  DOB: 02/05/1956  DOS: 11/12/2022  Specialty: Interventional Pain Management  MRN: [id number]  Setting: Ambulatory outpatient  PCP: Rusty Aus, MD  Type: Established Patient    Referring Provider: Rusty Aus, MD  Location: Office  Delivery: Face-to-face     HPI  [name], a 67 y.o. year old female, is here today because of her Lumbar facet arthropathy [M47.816]. [name] primary complain today is Hip Pain (left) Last encounter: My last encounter with her was on 08/20/22 Pertinent problems: [name] has Chronic pain syndrome; Encounter for long-term opiate analgesic use; Bilateral primary osteoarthritis of knee; SI (sacroiliac) joint dysfunction; History of left hip replacement; Recurrent major depressive episodes, moderate ([place]); Cervical radicular pain; Pain management contract signed; Lumbar facet arthropathy; Pain in joint of right shoulder; and Cervical facet joint syndrome on their pertinent problem list. Pain Assessment: Severity of Chronic pain is reported as a 6 /10. Location: Hip Left/denies. Onset: More than a month ago. Quality: Constant, Sharp, Dull. Timing: Constant. Modifying factor(s): creams,. Vitals:  height is 5' (1.524 m) and weight is 174 lb (78.9 kg). Her temporal temperature is 97.4 F (36.3 C) (abnormal). Her blood pressure is 136/47 (abnormal) and her pulse is 70. Her oxygen saturation is 100%.   Reason for encounter: medication management.   No  change in medical history since last visit.  Patient's pain is at baseline.  Patient continues multimodal pain regimen as prescribed.  States that it provides pain relief and improvement in functional status. Continues to endorse persistent left hip pain related to hip surgery with hardware present.  Has tried a left piriformis and left hip bursa injection which was not helpful unfortunately. She is still endorsing good pain relief and functional response after 2 series of Hyalgan knee injections done 03/19/2021.   Pharmacotherapy Assessment  Analgesic: Norco 5 mg QID prn   Monitoring: [place] PMP: PDMP reviewed during this encounter.       Pharmacotherapy: No side-effects or adverse reactions reported. Compliance: No problems identified. Effectiveness: Clinically acceptable.  Arlice Colt, RN  11/12/2022  8:59 AM  Sign when Signing Visit Safety precautions to be maintained throughout the outpatient stay will include: orient to surroundings, keep bed in low position, maintain call bell within reach at all times, provide assistance with transfer out of bed and ambulation.   Nursing Pain Medication Assessment:  Safety precautions to be maintained throughout the outpatient stay will include: orient to surroundings, keep bed in low position, maintain call bell within reach at all times, provide assistance with transfer out of bed and ambulation.  Medication Inspection Compliance: [name] did not comply with our request to bring her pills to be counted. She was reminded that bringing the medication bottles, even when empty, is a requirement.  Medication:  pt did not bring her medication       UDS:  Summary  Date Value Ref Range Status  05/26/2022 Note  Final    Comment:    ==================================================================== [facility] (MW) ==================================================================== Test  Result       Flag        Units  Drug Present and Declared for Prescription Verification   Hydrocodone                    4270         EXPECTED   ng/mg creat   Hydromorphone                  287          EXPECTED   ng/mg creat   Dihydrocodeine                 570          EXPECTED   ng/mg creat   Norhydrocodone                 3017         EXPECTED   ng/mg creat    Sources of hydrocodone include scheduled prescription medications.    Hydromorphone, dihydrocodeine and norhydrocodone are expected    metabolites of hydrocodone. Hydromorphone and dihydrocodeine are    also available as scheduled prescription medications.  ==================================================================== Test                      Result    Flag   Units      Ref Range   Creatinine              46               mg/dL      >=20 ==================================================================== Declared Medications:  The flagging and interpretation on this report are based on the  following declared medications.  Unexpected results may arise from  inaccuracies in the declared medications.   **Note: The testing scope of this panel includes these medications:   Hydrocodone (Norco)   **Note: The testing scope of this panel does not include the  following reported medications:   Acetaminophen (Tylenol)  Acetaminophen (Norco)  Alendronate (Fosamax)  Amlodipine (Norvasc)  Aripiprazole (Abilify)  Benztropine (Cogentin)  Escitalopram (Lexapro)  Furosemide (Lasix)  Glimepiride (Amaryl)  Hydroxyzine (Vistaril)  Insulin (Lantus)  Lactulose (Chronulac)  Lamotrigine (Lamictal)  Linaclotide (Linzess)  Meclizine (Antivert)  Metformin (Glucophage)  Pantoprazole (Protonix)  Semaglutide (Ozempic)  Spironolactone (Aldactone)  Tizanidine (Zanaflex)  Vitamin B  Vitamin C ==================================================================== For clinical consultation, please call [phone]. ====================================================================      ROS  Constitutional: Denies any fever or chills Gastrointestinal: No reported hemesis, hematochezia, vomiting, or acute GI distress Musculoskeletal:  Left hip pain Neurological: No reported episodes of acute onset apraxia, aphasia, dysarthria, agnosia, amnesia, paralysis, loss of coordination, or loss of consciousness  Medication Review  ARIPiprazole, HYDROcodone-acetaminophen, Insulin Glargine w/ Trans Port, Insulin Pen Needle, Semaglutide (1 MG/DOSE), alendronate, amLODipine, benztropine, escitalopram, furosemide, glucose blood, hydrOXYzine, insulin glargine, lamoTRIgine, meclizine, metFORMIN, pantoprazole, polyethylene glycol powder, rifaximin, spironolactone, and tizanidine  History Review  Allergy: Margaret [name] is allergic to lithium, penicillins, sulfa antibiotics, and benzonatate. Drug: Margaret [name]  reports that she does not currently use drugs. Alcohol:  reports that she does not currently use alcohol. Tobacco:  reports that she quit smoking about 11 years ago. Her smoking use included cigarettes. She has never used smokeless tobacco. Social: Margaret [name]  reports that she quit smoking about 11 years ago. Her smoking use included cigarettes. She has never used smokeless tobacco. She reports that she does not currently use alcohol. She reports that  she does not currently use drugs. Medical:  has a past medical history of Bipolar 1 disorder ([place]), Diabetes mellitus without complication ([place]), and Hypertension. Surgical: Margaret [name]  has a past surgical history that includes Abdominal hysterectomy; Tonsillectomy; Cholecystectomy; Esophagogastroduodenoscopy (egd) with propofol (N/A, 06/23/2019); and Colonoscopy with propofol (N/A, 06/23/2019). Family: family history includes Bipolar disorder in her brother, paternal uncle, and paternal uncle; Mental illness in her father.  Laboratory Chemistry Profile    Renal Lab Results  Component Value Date   BUN 11 12/06/2019   CREATININE 0.59 12/06/2019   BCR 19 12/06/2019   GFRAA 114 12/06/2019   GFRNONAA 99 12/06/2019    Hepatic Lab Results  Component Value Date   AST 48 (H) 12/06/2019   ALT 47 (H) 12/06/2019   ALBUMIN 3.7 (L) 12/06/2019   ALKPHOS 122 (H) 12/06/2019    Electrolytes Lab Results  Component Value Date   NA 137 12/06/2019   K 3.9 12/06/2019   CL 101 12/06/2019   CALCIUM 9.7 12/06/2019    Bone No results found for: "VD25OH", "VD125OH2TOT", "PJ8250NL9", "JQ7341PF7", "25OHVITD1", "25OHVITD2", "25OHVITD3", "TESTOFREE", "TESTOSTERONE"  Inflammation (CRP: Acute Phase) (ESR: Chronic Phase) No results found for: "CRP", "ESRSEDRATE", "LATICACIDVEN"       Note: Above Lab results reviewed.   Physical Exam  General appearance: Well nourished, well developed, and well hydrated. In no apparent acute distress Mental status: Alert, oriented x 3 (person, place, & time)       Respiratory: No evidence of acute respiratory distress Eyes: PERLA Vitals: BP (!) 136/47 (BP Location: Right Arm, Patient Position: Sitting, Cuff Size: Normal)   Pulse 70   Temp (!) 97.4 F (36.3 C) (Temporal)   Ht 5' (1.524 m)   Wt 174 lb (78.9 kg)   SpO2 100%   BMI 33.98 kg/m  BMI: Estimated body mass index is 33.98 kg/m as calculated from the following:   Height as of this encounter: 5' (1.524 m).   Weight as of this encounter: 174 lb (78.9 kg). Ideal: Ideal body weight: 45.5 kg (100 lb 4.9 oz) Adjusted ideal body weight: 58.9 kg (129 lb 12.6 oz)  Lumbar Spine Area Exam  Skin & Axial Inspection: No masses, redness, or swelling Alignment: Symmetrical Functional ROM: Pain restricted ROM       Stability: No instability detected Muscle Tone/Strength: Functionally intact. No obvious neuro-muscular anomalies detected. Sensory (Neurological): Musculoskeletal pain pattern  Lower Extremity Exam    Side: Right lower extremity  Side: Left lower  extremity  Stability: No instability observed          Stability: No instability observed          Skin & Extremity Inspection: Skin color, temperature, and hair growth are WNL. No peripheral edema or cyanosis. No masses, redness, swelling, asymmetry, or associated skin lesions. No contractures.  Skin & Extremity Inspection:  Left ankle in brace  Functional ROM: Unrestricted ROM                  Functional ROM: Pain restricted ROM                  Muscle Tone/Strength: Functionally intact. No obvious neuro-muscular anomalies detected.  Muscle Tone/Strength: Functionally intact. No obvious neuro-muscular anomalies detected.  Sensory (Neurological): Unimpaired        Sensory (Neurological): Arthropathic arthralgia        DTR: Patellar: deferred today Achilles: deferred today Plantar: deferred today  DTR: Patellar: deferred today Achilles: deferred today Plantar: deferred today  Palpation: No palpable anomalies  Palpation: No palpable anomalies    Assessment   Status Diagnosis  Controlled Controlled Controlled 1. Lumbar facet arthropathy   2. Encounter for long-term opiate analgesic use   3. Cervical radicular pain   4. Bilateral primary osteoarthritis of knee   5. History of left hip replacement (2017)   6. Radicular pain of left lower extremity   7. Chronic radicular lumbar pain   8. Chronic pain syndrome         Plan of Care    Margaret [name] has a current medication list which includes the following long-term medication(s): aripiprazole, benztropine, escitalopram, furosemide, lamotrigine, metformin, pantoprazole, and spironolactone.  Pharmacotherapy (Medications Ordered): Meds ordered this encounter  Medications   HYDROcodone-acetaminophen (NORCO/VICODIN) 5-325 MG tablet    Sig: Take 1 tablet by mouth every 6 (six) hours as needed for severe pain. Must last 30 days.    Dispense:  120 tablet    Refill:  0    Chronic Pain: STOP Act (Not applicable) Fill 1 day early if  closed on refill date. Avoid benzodiazepines within 8 hours of opioids   HYDROcodone-acetaminophen (NORCO/VICODIN) 5-325 MG tablet    Sig: Take 1 tablet by mouth every 6 (six) hours as needed for severe pain. Must last 30 days.    Dispense:  120 tablet    Refill:  0    Chronic Pain: STOP Act (Not applicable) Fill 1 day early if closed on refill date. Avoid benzodiazepines within 8 hours of opioids   HYDROcodone-acetaminophen (NORCO/VICODIN) 5-325 MG tablet    Sig: Take 1 tablet by mouth every 6 (six) hours as needed for severe pain. Must last 30 days.    Dispense:  120 tablet    Refill:  0    Chronic Pain: STOP Act (Not applicable) Fill 1 day early if closed on refill date. Avoid benzodiazepines within 8 hours of opioids   Encourage patient to continue with dieting and weight loss strategies. Repeat intra-articular Hyalgan as needed, still seeing good response from her previous injection done 03/19/2021.   Follow-up plan:   Return in about 4 months (around 03/02/2023) for Medication Management, in person.    Recent Visits Date Type Provider Dept  08/20/22 Office Visit Gillis Santa, MD [facility]  Showing recent visits within past 90 days and meeting all other requirements Today's Visits Date Type Provider Dept  11/12/22 Office Visit Gillis Santa, MD [facility]  Showing today's visits and meeting all other requirements Future Appointments No visits were found meeting these conditions. Showing future appointments within next 90 days and meeting all other requirements  I discussed the assessment and treatment plan with the patient. The patient was provided an opportunity to ask questions and all were answered. The patient agreed with the plan and demonstrated an understanding of the instructions.  Patient advised to call back or seek an in-person evaluation if the symptoms or condition worsens.  Duration of encounter: 58mnutes.  Note by: BGillis Santa  MD Date: 11/12/2022; Time: 10:07 AM

## 2022-11-12 NOTE — Progress Notes (Signed)
Safety precautions to be maintained throughout the outpatient stay will include: orient to surroundings, keep bed in low position, maintain call bell within reach at all times, provide assistance with transfer out of bed and ambulation.   Nursing Pain Medication Assessment:  Safety precautions to be maintained throughout the outpatient stay will include: orient to surroundings, keep bed in low position, maintain call bell within reach at all times, provide assistance with transfer out of bed and ambulation.  Medication Inspection Compliance: Margaret [name] did not comply with our request to bring her pills to be counted. She was reminded that bringing the medication bottles, even when empty, is a requirement.  Medication:  pt did not bring her medication

## 2022-11-13 ENCOUNTER — Telehealth: Payer: Self-pay

## 2022-11-13 ENCOUNTER — Encounter: Payer: Self-pay | Admitting: Internal Medicine

## 2022-11-13 ENCOUNTER — Inpatient Hospital Stay: Payer: Medicaid Other

## 2022-11-13 VITALS — BP 112/60 | HR 85 | Temp 98.0°F | Resp 20

## 2022-11-13 DIAGNOSIS — D500 Iron deficiency anemia secondary to blood loss (chronic): Secondary | ICD-10-CM | POA: Diagnosis not present

## 2022-11-13 MED ORDER — SODIUM CHLORIDE 0.9 % IV SOLN
200.0000 mg | Freq: Once | INTRAVENOUS | Status: AC
  Administered 2022-11-13: 200 mg via INTRAVENOUS
  Filled 2022-11-13: qty 200

## 2022-11-13 MED ORDER — SODIUM CHLORIDE 0.9 % IV SOLN
Freq: Once | INTRAVENOUS | Status: AC
  Filled 2022-11-13: qty 250

## 2022-11-13 MED ORDER — ONDANSETRON HCL 8 MG PO TABS: 8.0000 mg | ORAL_TABLET | Freq: Three times a day (TID) | ORAL | 0 refills | Status: DC | PRN

## 2022-11-13 NOTE — Telephone Encounter (Signed)
Patient here for her 2nd Venofer infusion today and informed infusion nurse that she was extremely nauseated after her first venofer infusion and she was in the bed for 2 days.  Rx for Zofran 8 mg every 8 hours as needed- # 40; no refills sent to [facility] as ordered by MD.

## 2022-11-13 NOTE — Progress Notes (Signed)
Patient here for 2nd dose of IV Venofer. Patient states that she was extremely nauseated after her 1st infusion, and was forced to stay in the bed for 2 days. Dr. Rogue Bussing was notified of this prior to starting IV today. Per Dr. Rogue Bussing, proceed with venofer today, and have patient pick up new prescription for ondansetron to take po as needed for nausea. RX sent to [facility], as patient requested. Patient educated on how/when to take ondansetron and verbalized understanding. Patient tolerated infusion well.

## 2022-11-14 ENCOUNTER — Encounter: Payer: Self-pay | Admitting: Internal Medicine

## 2022-11-16 ENCOUNTER — Encounter: Payer: Self-pay | Admitting: Internal Medicine

## 2022-11-19 ENCOUNTER — Encounter: Payer: Medicaid Other | Admitting: Student in an Organized Health Care Education/Training Program

## 2022-11-19 ENCOUNTER — Encounter: Payer: Self-pay | Admitting: Internal Medicine

## 2022-11-20 ENCOUNTER — Inpatient Hospital Stay: Payer: Medicaid Other

## 2022-11-20 VITALS — BP 130/46 | HR 72 | Temp 98.0°F | Resp 18

## 2022-11-20 DIAGNOSIS — D500 Iron deficiency anemia secondary to blood loss (chronic): Secondary | ICD-10-CM | POA: Diagnosis not present

## 2022-11-20 MED ORDER — SODIUM CHLORIDE 0.9 % IV SOLN
Freq: Once | INTRAVENOUS | Status: AC
  Filled 2022-11-20: qty 250

## 2022-11-20 MED ORDER — SODIUM CHLORIDE 0.9 % IV SOLN
200.0000 mg | Freq: Once | INTRAVENOUS | Status: AC
  Administered 2022-11-20: 200 mg via INTRAVENOUS
  Filled 2022-11-20: qty 200

## 2022-11-20 NOTE — Patient Instructions (Signed)
Iron Sucrose Injection What is this medication? IRON SUCROSE (EYE ern SOO krose) treats low levels of iron (iron deficiency anemia) in people with kidney disease. Iron is a mineral that plays an important role in making red blood cells, which carry oxygen from your lungs to the rest of your body. This medicine may be used for other purposes; ask your health care provider or pharmacist if you have questions. COMMON BRAND NAME(S): Venofer What should I tell my care team before I take this medication? They need to know if you have any of these conditions: Anemia not caused by low iron levels Heart disease High levels of iron in the blood Kidney disease Liver disease An unusual or allergic reaction to iron, other medications, foods, dyes, or preservatives Pregnant or trying to get pregnant Breastfeeding How should I use this medication? This medication is for infusion into a vein. It is given in a hospital or clinic setting. Talk to your care team about the use of this medication in children. While this medication may be prescribed for children as young as 2 years for selected conditions, precautions do apply. Overdosage: If you think you have taken too much of this medicine contact a poison control center or emergency room at once. NOTE: This medicine is only for you. Do not share this medicine with others. What if I miss a dose? Keep appointments for follow-up doses. It is important not to miss your dose. Call your care team if you are unable to keep an appointment. What may interact with this medication? Do not take this medication with any of the following: Deferoxamine Dimercaprol Other iron products This medication may also interact with the following: Chloramphenicol Deferasirox This list may not describe all possible interactions. Give your health care provider a list of all the medicines, herbs, non-prescription drugs, or dietary supplements you use. Also tell them if you smoke,  drink alcohol, or use illegal drugs. Some items may interact with your medicine. What should I watch for while using this medication? Visit your care team regularly. Tell your care team if your symptoms do not start to get better or if they get worse. You may need blood work done while you are taking this medication. You may need to follow a special diet. Talk to your care team. Foods that contain iron include: whole grains/cereals, dried fruits, beans, or peas, leafy green vegetables, and organ meats (liver, kidney). What side effects may I notice from receiving this medication? Side effects that you should report to your care team as soon as possible: Allergic reactions--skin rash, itching, hives, swelling of the face, lips, tongue, or throat Low blood pressure--dizziness, feeling faint or lightheaded, blurry vision Shortness of breath Side effects that usually do not require medical attention (report to your care team if they continue or are bothersome): Flushing Headache Joint pain Muscle pain Nausea Pain, redness, or irritation at injection site This list may not describe all possible side effects. Call your doctor for medical advice about side effects. You may report side effects to FDA at 1-800-FDA-1088. Where should I keep my medication? This medication is given in a hospital or clinic and will not be stored at home. NOTE: This sheet is a summary. It may not cover all possible information. If you have questions about this medicine, talk to your doctor, pharmacist, or health care provider.  2023 Elsevier/Gold Standard (2021-01-30 00:00:00)

## 2022-11-27 ENCOUNTER — Inpatient Hospital Stay: Payer: Medicaid Other

## 2022-11-27 VITALS — BP 114/37 | HR 69 | Temp 97.7°F | Resp 20

## 2022-11-27 DIAGNOSIS — D500 Iron deficiency anemia secondary to blood loss (chronic): Secondary | ICD-10-CM

## 2022-11-27 MED ORDER — SODIUM CHLORIDE 0.9 % IV SOLN
Freq: Once | INTRAVENOUS | Status: AC
  Filled 2022-11-27: qty 250

## 2022-11-27 MED ORDER — SODIUM CHLORIDE 0.9 % IV SOLN
200.0000 mg | Freq: Once | INTRAVENOUS | Status: AC
  Administered 2022-11-27: 200 mg via INTRAVENOUS
  Filled 2022-11-27: qty 10

## 2022-11-27 NOTE — Patient Instructions (Signed)
[facility]  Discharge Instructions: Thank you for choosing [facility] to provide your oncology and hematology care.  If you have a lab appointment with the Cancer Center, please go directly to the Cancer Center and check in at the registration area.  Wear comfortable clothing and clothing appropriate for easy access to any Portacath or PICC line.   We strive to give you quality time with your provider. You may need to reschedule your appointment if you arrive late (15 or more minutes).  Arriving late affects you and other patients whose appointments are after yours.  Also, if you miss three or more appointments without notifying the office, you may be dismissed from the clinic at the provider's discretion.      For prescription refill requests, have your pharmacy contact our office and allow 72 hours for refills to be completed.    Today you received the following chemotherapy and/or immunotherapy agents venofer      To help prevent nausea and vomiting after your treatment, we encourage you to take your nausea medication as directed.  BELOW ARE SYMPTOMS THAT SHOULD BE REPORTED IMMEDIATELY: *FEVER GREATER THAN 100.4 F (38 C) OR HIGHER *CHILLS OR SWEATING *NAUSEA AND VOMITING THAT IS NOT CONTROLLED WITH YOUR NAUSEA MEDICATION *UNUSUAL SHORTNESS OF BREATH *UNUSUAL BRUISING OR BLEEDING *URINARY PROBLEMS (pain or burning when urinating, or frequent urination) *BOWEL PROBLEMS (unusual diarrhea, constipation, pain near the anus) TENDERNESS IN MOUTH AND THROAT WITH OR WITHOUT PRESENCE OF ULCERS (sore throat, sores in mouth, or a toothache) UNUSUAL RASH, SWELLING OR PAIN  UNUSUAL VAGINAL DISCHARGE OR ITCHING   Items with * indicate a potential emergency and should be followed up as soon as possible or go to the Emergency Department if any problems should occur.  Please show the CHEMOTHERAPY ALERT CARD or IMMUNOTHERAPY ALERT CARD at check-in to  the Emergency Department and triage nurse.  Should you have questions after your visit or need to cancel or reschedule your appointment, please contact [facility]  [phone] and follow the prompts.  Office hours are 8:00 a.m. to 4:30 p.m. Monday - Friday. Please note that voicemails left after 4:00 p.m. may not be returned until the following business day.  We are closed weekends and major holidays. You have access to a nurse at all times for urgent questions. Please call the main number to the clinic [phone] and follow the prompts.  For any non-urgent questions, you may also contact your provider using MyChart. We now offer e-Visits for anyone 18 and older to request care online for non-urgent symptoms. For details visit [email or web address].   Also download the MyChart app! Go to the app store, search "MyChart", open the app, select [facility], and log in with your MyChart username and password.    

## 2022-12-08 ENCOUNTER — Ambulatory Visit: Payer: Medicare Other | Admitting: Certified Registered"

## 2022-12-08 ENCOUNTER — Encounter: Payer: Self-pay | Admitting: *Deleted

## 2022-12-08 ENCOUNTER — Encounter
Admission: RE | Disposition: A | Discharge status: Home / Self Care | Payer: Self-pay | Source: Home / Self Care | Attending: Gastroenterology

## 2022-12-08 ENCOUNTER — Ambulatory Visit
Admission: RE | Admit: 2022-12-08 | Discharge: 2022-12-08 | Disposition: A | Discharge status: Home / Self Care | Payer: Medicare Other | Attending: Gastroenterology | Admitting: Gastroenterology

## 2022-12-08 DIAGNOSIS — I129 Hypertensive chronic kidney disease with stage 1 through stage 4 chronic kidney disease, or unspecified chronic kidney disease: Secondary | ICD-10-CM | POA: Diagnosis not present

## 2022-12-08 DIAGNOSIS — K7689 Other specified diseases of liver: Secondary | ICD-10-CM | POA: Diagnosis not present

## 2022-12-08 DIAGNOSIS — K766 Portal hypertension: Secondary | ICD-10-CM | POA: Insufficient documentation

## 2022-12-08 DIAGNOSIS — F319 Bipolar disorder, unspecified: Secondary | ICD-10-CM | POA: Insufficient documentation

## 2022-12-08 DIAGNOSIS — K219 Gastro-esophageal reflux disease without esophagitis: Secondary | ICD-10-CM | POA: Diagnosis not present

## 2022-12-08 DIAGNOSIS — Z1211 Encounter for screening for malignant neoplasm of colon: Secondary | ICD-10-CM | POA: Diagnosis present

## 2022-12-08 DIAGNOSIS — E1122 Type 2 diabetes mellitus with diabetic chronic kidney disease: Secondary | ICD-10-CM | POA: Diagnosis not present

## 2022-12-08 DIAGNOSIS — K3189 Other diseases of stomach and duodenum: Secondary | ICD-10-CM | POA: Insufficient documentation

## 2022-12-08 DIAGNOSIS — K31819 Angiodysplasia of stomach and duodenum without bleeding: Secondary | ICD-10-CM | POA: Diagnosis not present

## 2022-12-08 DIAGNOSIS — F419 Anxiety disorder, unspecified: Secondary | ICD-10-CM | POA: Diagnosis not present

## 2022-12-08 DIAGNOSIS — K7460 Unspecified cirrhosis of liver: Secondary | ICD-10-CM | POA: Insufficient documentation

## 2022-12-08 DIAGNOSIS — Z9049 Acquired absence of other specified parts of digestive tract: Secondary | ICD-10-CM | POA: Diagnosis not present

## 2022-12-08 DIAGNOSIS — N1831 Chronic kidney disease, stage 3a: Secondary | ICD-10-CM | POA: Insufficient documentation

## 2022-12-08 DIAGNOSIS — D123 Benign neoplasm of transverse colon: Secondary | ICD-10-CM | POA: Diagnosis not present

## 2022-12-08 DIAGNOSIS — Z87891 Personal history of nicotine dependence: Secondary | ICD-10-CM | POA: Diagnosis not present

## 2022-12-08 HISTORY — PX: COLONOSCOPY WITH PROPOFOL: SHX5780

## 2022-12-08 HISTORY — PX: ESOPHAGOGASTRODUODENOSCOPY (EGD) WITH PROPOFOL: SHX5813

## 2022-12-08 LAB — GLUCOSE, CAPILLARY: Glucose-Capillary: 137 mg/dL — ABNORMAL HIGH (ref 70–99)

## 2022-12-08 SURGERY — ESOPHAGOGASTRODUODENOSCOPY (EGD) WITH PROPOFOL
Anesthesia: General

## 2022-12-08 MED ORDER — DEXMEDETOMIDINE HCL IN NACL 200 MCG/50ML IV SOLN
INTRAVENOUS | Status: DC | PRN
  Administered 2022-12-08: 8 ug via INTRAVENOUS

## 2022-12-08 MED ORDER — SUCCINYLCHOLINE CHLORIDE 200 MG/10ML IV SOSY
PREFILLED_SYRINGE | INTRAVENOUS | Status: DC | PRN
  Administered 2022-12-08: 100 mg via INTRAVENOUS

## 2022-12-08 MED ORDER — STERILE WATER FOR IRRIGATION IR SOLN
Status: DC | PRN
  Administered 2022-12-08: 60 mL

## 2022-12-08 MED ORDER — SODIUM CHLORIDE 0.9 % IV SOLN: INTRAVENOUS | Status: DC

## 2022-12-08 MED ORDER — PROPOFOL 500 MG/50ML IV EMUL
INTRAVENOUS | Status: DC | PRN
  Administered 2022-12-08: 200 ug/kg/min via INTRAVENOUS
  Administered 2022-12-08: 100 mg via INTRAVENOUS

## 2022-12-08 MED ORDER — LIDOCAINE HCL (PF) 2 % IJ SOLN
INTRAMUSCULAR | Status: AC
  Filled 2022-12-08: qty 35

## 2022-12-08 MED ORDER — LIDOCAINE HCL (CARDIAC) PF 100 MG/5ML IV SOSY
PREFILLED_SYRINGE | INTRAVENOUS | Status: DC | PRN
  Administered 2022-12-08: 100 mg via INTRAVENOUS

## 2022-12-08 NOTE — Op Note (Signed)
[facility] Patient Name: Margaret [name] Procedure Date: 12/08/2022 8:20 AM MRN: [id number] Account #: [id number] Date of Birth: July 03, 1956 Admit Type: Outpatient Age: 67 Room: [facility] ENDO ROOM 1 Gender: Female Note Status: Finalized Instrument Name: Jasper Riling [id number] Procedure:             Colonoscopy Indications:           Surveillance: Personal history of adenomatous polyps                         on last colonoscopy > 3 years ago Providers:             Andrey Farmer MD, MD Medicines:             Monitored Anesthesia Care Complications:         No immediate complications. Estimated blood loss:                         Minimal. Procedure:             Pre-Anesthesia Assessment:                        - Prior to the procedure, a History and Physical was                         performed, and patient medications and allergies were                         reviewed. The patient is competent. The risks and                         benefits of the procedure and the sedation options and                         risks were discussed with the patient. All questions                         were answered and informed consent was obtained.                         Patient identification and proposed procedure were                         verified by the physician, the nurse, the                         anesthesiologist, the anesthetist and the technician                         in the endoscopy suite. Mental Status Examination:                         alert and oriented. Airway Examination: normal                         oropharyngeal airway and neck mobility. Respiratory                         Examination: clear to auscultation. CV Examination:  normal. Prophylactic Antibiotics: The patient does not                         require prophylactic antibiotics. Prior                         Anticoagulants: The patient has taken no  anticoagulant                         or antiplatelet agents. ASA Grade Assessment: III - A                         patient with severe systemic disease. After reviewing                         the risks and benefits, the patient was deemed in                         satisfactory condition to undergo the procedure. The                         anesthesia plan was to use monitored anesthesia care                         (MAC). Immediately prior to administration of                         medications, the patient was re-assessed for adequacy                         to receive sedatives. The heart rate, respiratory                         rate, oxygen saturations, blood pressure, adequacy of                         pulmonary ventilation, and response to care were                         monitored throughout the procedure. The physical                         status of the patient was re-assessed after the                         procedure.                        After obtaining informed consent, the colonoscope was                         passed under direct vision. Throughout the procedure,                         the patient's blood pressure, pulse, and oxygen                         saturations were monitored continuously. The  Colonoscope was introduced through the anus and                         advanced to the the ileocecal valve. The colonoscopy                         was somewhat difficult due to inadequate bowel prep.                         The patient tolerated the procedure well. The quality                         of the bowel preparation was inadequate. The ileocecal                         valve was photographed. Findings:      The perianal and digital rectal examinations were normal.      A 2 mm polyp was found in the transverse colon. The polyp was sessile.       The polyp was removed with a cold snare. Resection and retrieval were       complete.  Estimated blood loss was minimal.      A 1 mm polyp was found in the transverse colon. The polyp was sessile.       The polyp was removed with a jumbo cold forceps. Resection and retrieval       were complete. Estimated blood loss was minimal. Impression:            - Preparation of the colon was inadequate.                        - One 2 mm polyp in the transverse colon, removed with                         a cold snare. Resected and retrieved.                        - One 1 mm polyp in the transverse colon, removed with                         a jumbo cold forceps. Resected and retrieved. Recommendation:        - Repeat colonoscopy in 6 months because the bowel                         preparation was suboptimal.                        - Return to referring physician as previously                         scheduled. Procedure Code(s):     --- Professional ---                        [phone], Colonoscopy, flexible; with removal of                         tumor(s), polyp(s), or other lesion(s) by snare  technique                        45380, 59, Colonoscopy, flexible; with biopsy, single                         or multiple Diagnosis Code(s):     --- Professional ---                        Z86.010, Personal history of colonic polyps                        D12.3, Benign neoplasm of transverse colon (hepatic                         flexure or splenic flexure) CPT copyright 2022 American Medical Association. All rights reserved. The codes documented in this report are preliminary and upon coder review may  be revised to meet current compliance requirements. Andrey Farmer MD, MD 12/08/2022 9:34:48 AM Number of Addenda: 0 Note Initiated On: 12/08/2022 8:20 AM Scope Withdrawal Time: 0 hours 8 minutes 7 seconds  Total Procedure Duration: 0 hours 15 minutes 40 seconds  Estimated Blood Loss:  Estimated blood loss was minimal.      [facility]

## 2022-12-08 NOTE — Anesthesia Preprocedure Evaluation (Signed)
Anesthesia Evaluation  Patient identified by MRN, date of birth, ID band Patient awake    Reviewed: Allergy & Precautions, H&P , NPO status , Patient's Chart, lab work & pertinent test results, reviewed documented beta blocker date and time   History of Anesthesia Complications Negative for: history of anesthetic complications  Airway Mallampati: II  TM Distance: >3 FB Neck ROM: full    Dental  (+) Dental Advidsory Given, Partial Upper, Caps, Missing   Pulmonary neg pulmonary ROS, former smoker   Pulmonary exam normal breath sounds clear to auscultation       Cardiovascular Exercise Tolerance: Good hypertension, (-) angina (-) Past MI and (-) Cardiac Stents Normal cardiovascular exam(-) dysrhythmias (-) Valvular Problems/Murmurs Rhythm:regular Rate:Normal     Neuro/Psych  PSYCHIATRIC DISORDERS Anxiety Depression Bipolar Disorder   negative neurological ROS     GI/Hepatic ,GERD  ,,(+) Cirrhosis       NASH   Endo/Other  diabetes    Renal/GU CRFRenal disease  negative genitourinary   Musculoskeletal   Abdominal   Peds  Hematology  (+) Blood dyscrasia, anemia   Anesthesia Other Findings Past Medical History: No date: Bipolar 1 disorder (HCC) No date: Diabetes mellitus without complication (HCC)     Comment:  Type 2 No date: Hypertension   Reproductive/Obstetrics negative OB ROS                             Anesthesia Physical Anesthesia Plan  ASA: 3  Anesthesia Plan: General   Post-op Pain Management:    Induction: Intravenous, Rapid sequence and Cricoid pressure planned  PONV Risk Score and Plan:   Airway Management Planned: Oral ETT  Additional Equipment:   Intra-op Plan:   Post-operative Plan: Extubation in OR  Informed Consent: I have reviewed the patients History and Physical, chart, labs and discussed the procedure including the risks, benefits and alternatives for  the proposed anesthesia with the patient or authorized representative who has indicated his/her understanding and acceptance.     Dental Advisory Given  Plan Discussed with: Anesthesiologist, CRNA and Surgeon  Anesthesia Plan Comments:        Anesthesia Quick Evaluation

## 2022-12-08 NOTE — Interval H&P Note (Signed)
History and Physical Interval Note:  12/08/2022 8:29 AM  Margaret [name]  has presented today for surgery, with the diagnosis of [place].  The various methods of treatment have been discussed with the patient and family. After consideration of risks, benefits and other options for treatment, the patient has consented to  Procedure(s) with comments: ESOPHAGOGASTRODUODENOSCOPY (EGD) WITH PROPOFOL (N/A) - DM, OZEMPIC COLONOSCOPY WITH PROPOFOL (N/A) as a surgical intervention.  The patient's history has been reviewed, patient examined, no change in status, stable for surgery.  I have reviewed the patient's chart and labs.  Questions were answered to the patient's satisfaction.     Lesly Rubenstein  Ok to proceed with EGD/Colonoscopy

## 2022-12-08 NOTE — H&P (Signed)
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 No blood thinners. No family history of GI malignancies. History of cholecystectomy. INR was 1.2 and platelets 129.   No current facility-administered medications for this encounter.  Medications Prior to Admission  Medication Sig Dispense Refill Last Dose   alendronate (FOSAMAX) 35 MG tablet Take 35 mg by mouth every 7 (seven) days. Take with a full glass of water on an empty stomach.   Past Week   amLODipine (NORVASC) 2.5 MG tablet Take by mouth.   12/07/2022   ARIPiprazole (ABILIFY) 10 MG tablet Take 1 tablet (10 mg total) by mouth daily. 90 tablet 0 12/07/2022   benztropine (COGENTIN) 0.5 MG tablet Take 1 tablet (0.5 mg total) by mouth daily as needed for tremors. 15 tablet 1 12/07/2022   escitalopram (LEXAPRO) 10 MG tablet Take 1 tablet (10 mg total) by mouth daily. 90 tablet 0 12/07/2022   furosemide (LASIX) 20 MG tablet Take 10 mg by mouth daily as needed.   12/07/2022   glucose blood (PRECISION QID TEST) test strip 1 each.   12/07/2022   HYDROcodone-acetaminophen (NORCO/VICODIN) 5-325 MG tablet Take 1 tablet by mouth every 6 (six) hours as needed for severe pain. Must last 30 days. 120 tablet 0 12/08/2022 at 0630   hydrOXYzine (VISTARIL) 25 MG capsule Take 25 mg by mouth daily as needed for anxiety.   Past Week   insulin glargine (LANTUS) 100 UNIT/ML Solostar Pen Inject into the skin.   Past Week   Insulin Glargine w/ Trans Port 100 UNIT/ML SOPN Inject into the skin.   Past Week   lamoTRIgine (LAMICTAL) 200 MG tablet Take 1 tablet (200 mg total) by mouth daily. 90 tablet 0 12/07/2022   metFORMIN  (GLUCOPHAGE) 500 MG tablet Take 500 mg by mouth 2 (two) times daily with a meal.   12/07/2022   pantoprazole (PROTONIX) 40 MG tablet Take 40 mg by mouth daily.   12/07/2022   polyethylene glycol powder (GLYCOLAX/MIRALAX) 17 GM/SCOOP powder SMARTSIG:17 Gram(s) By Mouth 1-2 Times Daily   12/07/2022   rifaximin (XIFAXAN) 550 MG TABS tablet Take by mouth.   12/07/2022   spironolactone (ALDACTONE) 25 MG tablet Take 25 mg by mouth daily.   12/07/2022   SURE COMFORT PEN NEEDLES 31G X 8 MM MISC USE AS DIRECTED TWICEEDAILY.T   12/07/2022   tizanidine (ZANAFLEX) 2 MG capsule Take 2 mg by mouth 3 (three) times daily as needed for muscle spasms. Takes differently   12/07/2022   [START ON 01/04/2023] HYDROcodone-acetaminophen (NORCO/VICODIN) 5-325 MG tablet Take 1 tablet by mouth every 6 (six) hours as needed for severe pain. Must last 30 days. 120 tablet 0    [START ON 02/03/2023] HYDROcodone-acetaminophen (NORCO/VICODIN) 5-325 MG tablet Take 1 tablet by mouth every 6 (six) hours as needed for severe pain. Must last 30 days. 120 tablet 0    Insulin Glargine w/ Trans Port 100 UNIT/ML SOPN Inject into the skin.      meclizine (ANTIVERT) 25 MG tablet Take by mouth 2 (two) times daily as needed.    at prn   ondansetron (ZOFRAN) 8 MG tablet Take 1 tablet (8 mg total)  by mouth every 8 (eight) hours as needed for nausea or vomiting. 40 tablet 0  at prn   OZEMPIC, 1 MG/DOSE, 4 MG/3ML SOPN Inject 1 mg into the skin once a week.   12/02/2022     Allergies  Allergen Reactions   Lithium Nausea And Vomiting and Other (See Comments)   Penicillins Hives    Resulted in hospitalization   Sulfa Antibiotics Hives    Resulted in hospitalization   Benzonatate Other (See Comments)     Past Medical History:  Diagnosis Date   Bipolar 1 disorder ([place])    Diabetes mellitus without complication ([place])    Type 2   Hypertension     Review of systems:  Otherwise negative.    Physical Exam  Gen: Alert, oriented. Appears stated age.   HEENT: PERRLA. Lungs: No respiratory distress CV: RRR Abd: soft, benign, no masses Ext: No edema    Planned procedures: Proceed with EGD/colonoscopy. The patient understands the nature of the planned procedure, indications, risks, alternatives and potential complications including but not limited to bleeding, infection, perforation, damage to internal organs and possible oversedation/side effects from anesthesia. The patient agrees and gives consent to proceed.  Please refer to procedure notes for findings, recommendations and patient disposition/instructions.     Margaret Rubenstein, MD [facility]

## 2022-12-08 NOTE — Transfer of Care (Signed)
Immediate Anesthesia Transfer of Care Note  Patient: Margaret [name]  Procedure(s) Performed: ESOPHAGOGASTRODUODENOSCOPY (EGD) WITH PROPOFOL COLONOSCOPY WITH PROPOFOL  Patient Location: PACU  Anesthesia Type:General  Level of Consciousness: drowsy  Airway & Oxygen Therapy: Patient Spontanous Breathing  Post-op Assessment: Report given to RN and Post -op Vital signs reviewed and stable  Post vital signs: Reviewed and stable  Last Vitals:  Vitals Value Taken Time  BP 107/48 12/08/22 0925  Temp 35.9 C 12/08/22 0925  Pulse 60 12/08/22 0931  Resp 17 12/08/22 0931  SpO2 96 % 12/08/22 0931  Vitals shown include unvalidated device data.  Last Pain:  Vitals:   12/08/22 0925  TempSrc: Temporal  PainSc: 0-No pain         Complications: No notable events documented.

## 2022-12-08 NOTE — Op Note (Signed)
[facility] Patient Name: Margaret [name] Procedure Date: 12/08/2022 8:20 AM MRN: [id number] Account #: [id number] Date of Birth: [birth year] Admit Type: Outpatient Age: 67 Room: [facility] ENDO ROOM 1 Gender: Female Note Status: Finalized Instrument Name: Altamese Cabal Endoscope [id number] Procedure:             Upper GI endoscopy Indications:           Portal hypertension rule out esophageal varices Providers:             Andrey Farmer MD, MD Medicines:             Monitored Anesthesia Care Complications:         No immediate complications. Estimated blood loss:                         Minimal. Procedure:             Pre-Anesthesia Assessment:                        - Prior to the procedure, a History and Physical was                         performed, and patient medications and allergies were                         reviewed. The patient is competent. The risks and                         benefits of the procedure and the sedation options and                         risks were discussed with the patient. All questions                         were answered and informed consent was obtained.                         Patient identification and proposed procedure were                         verified by the physician, the nurse, the                         anesthesiologist, the anesthetist and the technician                         in the endoscopy suite. Mental Status Examination:                         alert and oriented. Airway Examination: normal                         oropharyngeal airway and neck mobility. Respiratory                         Examination: clear to auscultation. CV Examination:                         normal. Prophylactic Antibiotics: The patient does not  require prophylactic antibiotics. Prior                         Anticoagulants: The patient has taken no anticoagulant                         or antiplatelet agents.  ASA Grade Assessment: III - A                         patient with severe systemic disease. After reviewing                         the risks and benefits, the patient was deemed in                         satisfactory condition to undergo the procedure. The                         anesthesia plan was to use monitored anesthesia care                         (MAC). Immediately prior to administration of                         medications, the patient was re-assessed for adequacy                         to receive sedatives. The heart rate, respiratory                         rate, oxygen saturations, blood pressure, adequacy of                         pulmonary ventilation, and response to care were                         monitored throughout the procedure. The physical                         status of the patient was re-assessed after the                         procedure.                        After obtaining informed consent, the endoscope was                         passed under direct vision. Throughout the procedure,                         the patient's blood pressure, pulse, and oxygen                         saturations were monitored continuously. The Endoscope                         was introduced through the mouth, and advanced to the  second part of duodenum. The upper GI endoscopy was                         accomplished without difficulty. The patient tolerated                         the procedure well. Findings:      There is no endoscopic evidence of varices in the entire esophagus.      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 Biopsies were taken with a cold forceps for histology.       Estimated blood loss was minimal.      Mild portal hypertensive gastropathy was found in the gastric fundus and       in the gastric body.      The exam of the stomach was otherwise normal.      The  examined duodenum was normal. Impression:            - Gastric antral vascular ectasia without bleeding.                         Biopsied.                        - Portal hypertensive gastropathy.                        - Normal examined duodenum. Recommendation:        - Await pathology results.                        - Perform a colonoscopy today. Procedure Code(s):     --- Professional ---                        [phone], Esophagogastroduodenoscopy, flexible,                         transoral; with biopsy, single or multiple Diagnosis Code(s):     --- Professional ---                        K31.819, Angiodysplasia of stomach and duodenum                         without bleeding                        K76.6, Portal hypertension                        K31.89, Other diseases of stomach and duodenum CPT copyright 2022 American Medical Association. All rights reserved. The codes documented in this report are preliminary and upon coder review may  be revised to meet current compliance requirements. Andrey Farmer MD, MD 12/08/2022 9:27:05 AM Number of Addenda: 0 Note Initiated On: 12/08/2022 8:20 AM Estimated Blood Loss:  Estimated blood loss was minimal.      [facility]

## 2022-12-09 ENCOUNTER — Encounter: Payer: Self-pay | Admitting: Gastroenterology

## 2022-12-09 LAB — SURGICAL PATHOLOGY

## 2022-12-24 NOTE — Anesthesia Postprocedure Evaluation (Signed)
Anesthesia Post Note  Patient: [place] Whelan  Procedure(s) Performed: ESOPHAGOGASTRODUODENOSCOPY (EGD) WITH PROPOFOL COLONOSCOPY WITH PROPOFOL  Patient location during evaluation: Endoscopy Anesthesia Type: General Level of consciousness: awake and alert Pain management: pain level controlled Vital Signs Assessment: post-procedure vital signs reviewed and stable Respiratory status: spontaneous breathing, nonlabored ventilation, respiratory function stable and patient connected to nasal cannula oxygen Cardiovascular status: blood pressure returned to baseline and stable Postop Assessment: no apparent nausea or vomiting Anesthetic complications: no   There were no known notable events for this encounter.   Last Vitals:  Vitals:   12/08/22 0935 12/08/22 0945  BP: (!) 106/40 (!) 128/52  Pulse:    Resp:    Temp:    SpO2:      Last Pain:  Vitals:   12/08/22 0945  TempSrc:   PainSc: 0-No pain                 Martha Clan

## 2022-12-29 ENCOUNTER — Inpatient Hospital Stay: Payer: Medicare Other

## 2022-12-29 ENCOUNTER — Encounter: Payer: Self-pay | Admitting: Internal Medicine

## 2022-12-29 ENCOUNTER — Other Ambulatory Visit: Payer: Self-pay | Admitting: *Deleted

## 2022-12-29 ENCOUNTER — Inpatient Hospital Stay: Payer: Medicare Other | Attending: Internal Medicine

## 2022-12-29 ENCOUNTER — Inpatient Hospital Stay (HOSPITAL_BASED_OUTPATIENT_CLINIC_OR_DEPARTMENT_OTHER): Payer: Medicare Other | Admitting: Internal Medicine

## 2022-12-29 VITALS — BP 103/38 | HR 79

## 2022-12-29 VITALS — BP 129/44 | HR 83 | Temp 97.8°F | Resp 16 | Wt 166.9 lb

## 2022-12-29 DIAGNOSIS — D500 Iron deficiency anemia secondary to blood loss (chronic): Secondary | ICD-10-CM | POA: Diagnosis not present

## 2022-12-29 DIAGNOSIS — K922 Gastrointestinal hemorrhage, unspecified: Secondary | ICD-10-CM | POA: Diagnosis present

## 2022-12-29 DIAGNOSIS — K7460 Unspecified cirrhosis of liver: Secondary | ICD-10-CM | POA: Diagnosis not present

## 2022-12-29 DIAGNOSIS — D649 Anemia, unspecified: Secondary | ICD-10-CM

## 2022-12-29 DIAGNOSIS — Z79620 Long term (current) use of immunosuppressive biologic: Secondary | ICD-10-CM | POA: Insufficient documentation

## 2022-12-29 LAB — CBC WITH DIFFERENTIAL/PLATELET
Abs Immature Granulocytes: 0.02 10*3/uL (ref 0.00–0.07)
Basophils Absolute: 0 10*3/uL (ref 0.0–0.1)
Basophils Relative: 0 %
Eosinophils Absolute: 0 10*3/uL (ref 0.0–0.5)
Eosinophils Relative: 0 %
HCT: 24.5 % — ABNORMAL LOW (ref 36.0–46.0)
Hemoglobin: 7.7 g/dL — ABNORMAL LOW (ref 12.0–15.0)
Immature Granulocytes: 0 %
Lymphocytes Relative: 17 %
Lymphs Abs: 0.8 10*3/uL (ref 0.7–4.0)
MCH: 24.4 pg — ABNORMAL LOW (ref 26.0–34.0)
MCHC: 31.4 g/dL (ref 30.0–36.0)
MCV: 77.5 fL — ABNORMAL LOW (ref 80.0–100.0)
Monocytes Absolute: 0.5 10*3/uL (ref 0.1–1.0)
Monocytes Relative: 11 %
Neutro Abs: 3.3 10*3/uL (ref 1.7–7.7)
Neutrophils Relative %: 72 %
Platelets: 124 10*3/uL — ABNORMAL LOW (ref 150–400)
RBC: 3.16 MIL/uL — ABNORMAL LOW (ref 3.87–5.11)
RDW: 16 % — ABNORMAL HIGH (ref 11.5–15.5)
WBC: 4.7 10*3/uL (ref 4.0–10.5)
nRBC: 0 % (ref 0.0–0.2)

## 2022-12-29 LAB — COMPREHENSIVE METABOLIC PANEL
ALT: 39 U/L (ref 0–44)
AST: 50 U/L — ABNORMAL HIGH (ref 15–41)
Albumin: 3.7 g/dL (ref 3.5–5.0)
Alkaline Phosphatase: 81 U/L (ref 38–126)
Anion gap: 10 (ref 5–15)
BUN: 22 mg/dL (ref 8–23)
CO2: 24 mmol/L (ref 22–32)
Calcium: 9.3 mg/dL (ref 8.9–10.3)
Chloride: 100 mmol/L (ref 98–111)
Creatinine, Ser: 1.21 mg/dL — ABNORMAL HIGH (ref 0.44–1.00)
GFR, Estimated: 50 mL/min — ABNORMAL LOW (ref >60)
Glucose, Bld: 225 mg/dL — ABNORMAL HIGH (ref 70–99)
Potassium: 3.8 mmol/L (ref 3.5–5.1)
Sodium: 134 mmol/L — ABNORMAL LOW (ref 135–145)
Total Bilirubin: 0.8 mg/dL (ref 0.3–1.2)
Total Protein: 6.7 g/dL (ref 6.5–8.1)

## 2022-12-29 LAB — IRON AND TIBC
Iron: 21 ug/dL — ABNORMAL LOW (ref 28–170)
Saturation Ratios: 4 % — ABNORMAL LOW (ref 10.4–31.8)
TIBC: 503 ug/dL — ABNORMAL HIGH (ref 250–450)
UIBC: 482 ug/dL

## 2022-12-29 LAB — RETIC PANEL
Immature Retic Fract: 14.5 % (ref 2.3–15.9)
RBC.: 2.9 MIL/uL — ABNORMAL LOW (ref 3.87–5.11)
Retic Count, Absolute: 52 10*3/uL (ref 19.0–186.0)
Retic Ct Pct: 1.8 % (ref 0.4–3.1)
Reticulocyte Hemoglobin: 17.1 pg — ABNORMAL LOW (ref >27.9)

## 2022-12-29 LAB — LACTATE DEHYDROGENASE: LDH: 137 U/L (ref 98–192)

## 2022-12-29 LAB — FERRITIN: Ferritin: 8 ng/mL — ABNORMAL LOW (ref 11–307)

## 2022-12-29 LAB — TSH: TSH: 1.463 u[IU]/mL (ref 0.350–4.500)

## 2022-12-29 MED ORDER — SODIUM CHLORIDE 0.9 % IV SOLN
Freq: Once | INTRAVENOUS | Status: AC
  Filled 2022-12-29: qty 250

## 2022-12-29 MED ORDER — SODIUM CHLORIDE 0.9 % IV SOLN
200.0000 mg | Freq: Once | INTRAVENOUS | Status: AC
  Administered 2022-12-29: 200 mg via INTRAVENOUS
  Filled 2022-12-29: qty 200

## 2022-12-29 NOTE — Assessment & Plan Note (Addendum)
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  FEB 2024- Iron sat 9 Ferritin 45- wnl-PCP; Hb 9-10.     # s/p IV iron x4 [JAN 2024]- however today- Hb- 7.7.  No significant improvement noted.  Proceed with venofer; and look for other causes.  Also discussed the possible need for blood transfusion if her counts do not improve or get worse..  Patient states she never had a blood transfusion prior.  Discussed the potential risk of transfusion including but not limited to risk of infusion reactions; transmission of infections amongst others.  However the risk is extremely small; and the benefits of the infusion overweighs the risk.  Await blood work next week.  # Etiology: Cirrhosis/GI bleed FEB 2024- GAVE -non-bleeding-; colo-? Capsule study.  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  Hold off capsule.  Will recheck blood counts in 1 week.   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 Stable.   #Liver cirrhosis, splenomegaly, small volume ascites.  Patient does not drink alcohol.? NAFLD-check AFP at next visit.  Patient will need screening ultrasounds [? [facility]]-  # weight loss sec to oezmpic- stable. Low concerns for malignancy at this time.   # DISPOSITION: # add TSH; LDH; retic count to labs today.  # in 1 week- lab- H&H; hold tube.  # venofer today; # venofer weekly x4  # in 3 weeks follow up with APP- labs- [id number]; folic acid; PT/PTT; MM panel; K/l light chains- venofer- HOLD tube; possible D-2 1 unit PRBC # follow up in 2 months- ; MD; labs- cbc/cmp; possible venofer Dr.B

## 2022-12-29 NOTE — Progress Notes (Signed)
[name] NOTE  Patient Care Team: Rusty Aus, MD as PCP - General (Internal Medicine) Earlie Server, MD as Consulting Physician (Oncology)  CHIEF COMPLAINTS/PURPOSE OF CONSULTATION: Anemia/thrombocytopenia   HEMATOLOGY HISTORY  06/21/2019-06/24/2019 due to severe anemia; hemoglobin of 5.7 upon admission; patient received 2 units of blood transfusion, 3 doses of IV iron. EGD and colonoscopy were performed during the hospitalization EGD and colonoscopy on 06/23/2019. [Dr.Anna; [facility]] EGD showed no abnormalities and the colonoscopy showed 3 sessile diminutive polyps which were resected completely.  Arteries of the duodenum were normal.  3 colon polyps resected were serrated polyp x2 and a tubular adenoma x1. No active source of bleeding was noted.    Patient was scheduled for capsule endoscopy as outpatient?   # Cirrhosis NAFLD- [? [facility]]  HISTORY OF PRESENTING ILLNESS: ambulating with rolling walker [arthritis].   Margaret [name] 67 y.o.  female pleasant patient was been with a history of cirrhosis/nonalcoholic fatty liver is here for follow-up of anemia/thrombocytopenia.  In the interim patient had EGD/colonoscopy.  She continues to deny any blood in stools or black-colored stools.  Patient received IV iron infusions x 4 since last visit.  Pt has fatigue; Feels cold a lot. Feels better after her iron infusions. No visible blood in stools. Pt intentionally trying to lose weight. Appetite is lower due to Ozempic.    Review of Systems  Constitutional:  Positive for malaise/fatigue. Negative for chills, diaphoresis, fever and weight loss.  HENT:  Negative for nosebleeds and sore throat.   Eyes:  Negative for double vision.  Respiratory:  Positive for shortness of breath. Negative for cough, hemoptysis, sputum production and wheezing.   Cardiovascular:  Negative for chest pain, palpitations, orthopnea and leg swelling.  Gastrointestinal:  Negative for abdominal pain,  blood in stool, constipation, diarrhea, heartburn, melena, nausea and vomiting.  Genitourinary:  Negative for dysuria, frequency and urgency.  Musculoskeletal:  Positive for back pain and joint pain.  Skin: Negative.  Negative for itching and rash.  Neurological:  Negative for dizziness, tingling, focal weakness, weakness and headaches.  Endo/Heme/Allergies:  Does not bruise/bleed easily.  Psychiatric/Behavioral:  Negative for depression. The patient is not nervous/anxious and does not have insomnia.      MEDICAL HISTORY:  Past Medical History:  Diagnosis Date   Bipolar 1 disorder ([place])    Diabetes mellitus without complication ([place])    Type 2   Hypertension     SURGICAL HISTORY: Past Surgical History:  Procedure Laterality Date   ABDOMINAL HYSTERECTOMY     CHOLECYSTECTOMY     COLONOSCOPY WITH PROPOFOL N/A 06/23/2019   Procedure: COLONOSCOPY WITH PROPOFOL;  Surgeon: Jonathon Bellows, MD;  Location: [facility] ENDOSCOPY;  Service: Gastroenterology;  Laterality: N/A;   COLONOSCOPY WITH PROPOFOL N/A 12/08/2022   Procedure: COLONOSCOPY WITH PROPOFOL;  Surgeon: Lesly Rubenstein, MD;  Location: [facility] ENDOSCOPY;  Service: Endoscopy;  Laterality: N/A;   ESOPHAGOGASTRODUODENOSCOPY (EGD) WITH PROPOFOL N/A 06/23/2019   Procedure: ESOPHAGOGASTRODUODENOSCOPY (EGD) WITH PROPOFOL;  Surgeon: Jonathon Bellows, MD;  Location: [facility] ENDOSCOPY;  Service: Gastroenterology;  Laterality: N/A;   ESOPHAGOGASTRODUODENOSCOPY (EGD) WITH PROPOFOL N/A 12/08/2022   Procedure: ESOPHAGOGASTRODUODENOSCOPY (EGD) WITH PROPOFOL;  Surgeon: Lesly Rubenstein, MD;  Location: [facility] ENDOSCOPY;  Service: Endoscopy;  Laterality: N/A;  DM, OZEMPIC   TONSILLECTOMY      SOCIAL HISTORY: Social History   Socioeconomic History   Marital status: Divorced    Spouse name: Not on file   Number of children: 2   Years of  education: Not on file   Highest education level: Not on file  Occupational History   Not on file  Tobacco Use   Smoking  status: Former    Types: Cigarettes    Quit date: 03/03/2011    Years since quitting: 11.8   Smokeless tobacco: Never  Vaping Use   Vaping Use: Never used  Substance and Sexual Activity   Alcohol use: Not Currently   Drug use: Not Currently   Sexual activity: Not Currently  Other Topics Concern   Not on file  Social History Narrative   Not on file   Social Determinants of Health   Financial Resource Strain: Not on file  Food Insecurity: Not on file  Transportation Needs: Not on file  Physical Activity: Not on file  Stress: Not on file  Social Connections: Not on file  Intimate Partner Violence: Not on file    FAMILY HISTORY: Family History  Problem Relation Age of Onset   Mental illness Father    Bipolar disorder Brother    Bipolar disorder Paternal Uncle    Bipolar disorder Paternal Uncle     ALLERGIES:  is allergic to lithium, penicillins, sulfa antibiotics, and benzonatate.  MEDICATIONS:  Current Outpatient Medications  Medication Sig Dispense Refill   alendronate (FOSAMAX) 35 MG tablet Take 35 mg by mouth every 7 (seven) days. Take with a full glass of water on an empty stomach.     amLODipine (NORVASC) 2.5 MG tablet Take by mouth.     ARIPiprazole (ABILIFY) 10 MG tablet Take 1 tablet (10 mg total) by mouth daily. 90 tablet 0   benztropine (COGENTIN) 0.5 MG tablet Take 1 tablet (0.5 mg total) by mouth daily as needed for tremors. 15 tablet 1   escitalopram (LEXAPRO) 10 MG tablet Take 1 tablet (10 mg total) by mouth daily. 90 tablet 0   furosemide (LASIX) 20 MG tablet Take 10 mg by mouth daily as needed.     glucose blood (PRECISION QID TEST) test strip 1 each.     HYDROcodone-acetaminophen (NORCO/VICODIN) 5-325 MG tablet Take 1 tablet by mouth every 6 (six) hours as needed for severe pain. Must last 30 days. 120 tablet 0   [START ON 01/04/2023] HYDROcodone-acetaminophen (NORCO/VICODIN) 5-325 MG tablet Take 1 tablet by mouth every 6 (six) hours as needed for severe  pain. Must last 30 days. 120 tablet 0   [START ON 02/03/2023] HYDROcodone-acetaminophen (NORCO/VICODIN) 5-325 MG tablet Take 1 tablet by mouth every 6 (six) hours as needed for severe pain. Must last 30 days. 120 tablet 0   hydrOXYzine (VISTARIL) 25 MG capsule Take 25 mg by mouth daily as needed for anxiety.     insulin glargine (LANTUS) 100 UNIT/ML Solostar Pen Inject into the skin.     Insulin Glargine w/ Trans Port 100 UNIT/ML SOPN Inject into the skin.     Insulin Glargine w/ Trans Port 100 UNIT/ML SOPN Inject into the skin.     lamoTRIgine (LAMICTAL) 200 MG tablet Take 1 tablet (200 mg total) by mouth daily. 90 tablet 0   meclizine (ANTIVERT) 25 MG tablet Take by mouth 2 (two) times daily as needed.     metFORMIN (GLUCOPHAGE) 500 MG tablet Take 500 mg by mouth 2 (two) times daily with a meal.     OZEMPIC, 1 MG/DOSE, 4 MG/3ML SOPN Inject 1 mg into the skin once a week.     pantoprazole (PROTONIX) 40 MG tablet Take 40 mg by mouth daily.     polyethylene  glycol powder (GLYCOLAX/MIRALAX) 17 GM/SCOOP powder SMARTSIG:17 Gram(s) By Mouth 1-2 Times Daily     rifaximin (XIFAXAN) 550 MG TABS tablet Take by mouth.     spironolactone (ALDACTONE) 25 MG tablet Take 25 mg by mouth daily.     SURE COMFORT PEN NEEDLES 31G X 8 MM MISC USE AS DIRECTED TWICEEDAILY.T     tizanidine (ZANAFLEX) 2 MG capsule Take 2 mg by mouth 3 (three) times daily as needed for muscle spasms. Takes differently     ondansetron (ZOFRAN) 8 MG tablet Take 1 tablet (8 mg total) by mouth every 8 (eight) hours as needed for nausea or vomiting. (Patient not taking: Reported on 12/29/2022) 40 tablet 0   No current facility-administered medications for this visit.   Facility-Administered Medications Ordered in Other Visits  Medication Dose Route Frequency Provider Last Rate Last Admin   0.9 %  sodium chloride infusion   Intravenous Once Charlaine Dalton R, MD       iron sucrose (VENOFER) 200 mg in sodium chloride 0.9 % 100 mL IVPB  200  mg Intravenous Once Charlaine Dalton R, MD         PHYSICAL EXAMINATION:   Vitals:   12/29/22 1311  BP: (!) 129/44  Pulse: 83  Resp: 16  Temp: 97.8 F (36.6 C)  SpO2: 100%   Filed Weights   12/29/22 1311  Weight: 166 lb 14.4 oz (75.7 kg)    Physical Exam Vitals and nursing note reviewed.  HENT:     Head: Normocephalic and atraumatic.     Mouth/Throat:     Pharynx: Oropharynx is clear.  Eyes:     Extraocular Movements: Extraocular movements intact.     Pupils: Pupils are equal, round, and reactive to light.  Cardiovascular:     Rate and Rhythm: Normal rate and regular rhythm.  Pulmonary:     Comments: Decreased breath sounds bilaterally.  Abdominal:     Palpations: Abdomen is soft.  Musculoskeletal:        General: Normal range of motion.     Cervical back: Normal range of motion.  Skin:    General: Skin is warm.  Neurological:     General: No focal deficit present.     Mental Status: She is alert and oriented to person, place, and time.  Psychiatric:        Behavior: Behavior normal.        Judgment: Judgment normal.      LABORATORY DATA:  I have reviewed the data as listed Lab Results  Component Value Date   WBC 4.7 12/29/2022   HGB 7.7 (L) 12/29/2022   HCT 24.5 (L) 12/29/2022   MCV 77.5 (L) 12/29/2022   PLT 124 (L) 12/29/2022   Recent Labs    12/29/22 1256  NA 134*  K 3.8  CL 100  CO2 24  GLUCOSE 225*  BUN 22  CREATININE 1.21*  CALCIUM 9.3  GFRNONAA 50*  PROT 6.7  ALBUMIN 3.7  AST 50*  ALT 39  ALKPHOS 81  BILITOT 0.8     No results found.  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  FEB 2024- Iron sat 9 Ferritin 45- wnl-PCP; Hb 9-10.     # s/p IV iron x4 [JAN 2024]- however today- Hb- 7.7.  No significant improvement noted.  Proceed with venofer; and look for other causes.  Also discussed the possible need for  blood  transfusion if her counts do not improve or get worse..  Patient states she never had a blood transfusion prior.  Discussed the potential risk of transfusion including but not limited to risk of infusion reactions; transmission of infections amongst others.  However the risk is extremely small; and the benefits of the infusion overweighs the risk.  Await blood work next week.  # Etiology: Cirrhosis/GI bleed FEB 2024- GAVE -non-bleeding-; colo-? Capsule study.  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  Hold off capsule.  Will recheck blood counts in 1 week.   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 Stable.   #Liver cirrhosis, splenomegaly, small volume ascites.  Patient does not drink alcohol.? NAFLD-check AFP at next visit.  Patient will need screening ultrasounds [? [facility]]-  # weight loss sec to oezmpic- stable. Low concerns for malignancy at this time.   # DISPOSITION: # add TSH; LDH; retic count to labs today.  # in 1 week- lab- H&H; hold tube.  # venofer today; # venofer weekly x4  # in 3 weeks follow up with APP- labs- [id number]; folic acid; PT/PTT; MM panel; K/l light chains- venofer- HOLD tube; possible D-2 1 unit PRBC # follow up in 2 months- ; MD; labs- cbc/cmp; possible venofer Dr.B    All questions were answered. The patient knows to call the clinic with any problems, questions or concerns.    Cammie Sickle, MD 12/29/2022 2:17 PM            Did find the reason for

## 2022-12-29 NOTE — Progress Notes (Signed)
Pt has fatigue; Feels cold a lot. Feels better after her iron infusions. No visible blood in stools. Pt intentionally trying to lose weight. Appetite is lower due to Ozempic.

## 2022-12-29 NOTE — Patient Instructions (Signed)

## 2022-12-31 ENCOUNTER — Encounter: Payer: Self-pay | Admitting: Internal Medicine

## 2022-12-31 LAB — AFP TUMOR MARKER: AFP, Serum, Tumor Marker: 2.5 ng/mL (ref 0.0–9.2)

## 2023-01-01 ENCOUNTER — Encounter: Payer: Self-pay | Admitting: Internal Medicine

## 2023-01-04 ENCOUNTER — Encounter: Payer: Self-pay | Admitting: Internal Medicine

## 2023-01-05 ENCOUNTER — Other Ambulatory Visit: Payer: Medicaid Other

## 2023-01-05 ENCOUNTER — Ambulatory Visit: Payer: Medicaid Other

## 2023-01-07 ENCOUNTER — Telehealth: Payer: Self-pay | Admitting: Student in an Organized Health Care Education/Training Program

## 2023-01-07 NOTE — Telephone Encounter (Signed)
Patient wants Dr Holley Raring to know the meds he has her on do not help her pain. She will discuss with him at next visit.

## 2023-01-08 ENCOUNTER — Inpatient Hospital Stay: Payer: Medicare Other | Attending: Internal Medicine

## 2023-01-08 ENCOUNTER — Inpatient Hospital Stay: Payer: Medicare Other

## 2023-01-08 VITALS — BP 105/47 | HR 76 | Temp 97.6°F | Resp 16

## 2023-01-08 DIAGNOSIS — D500 Iron deficiency anemia secondary to blood loss (chronic): Secondary | ICD-10-CM | POA: Insufficient documentation

## 2023-01-08 DIAGNOSIS — D649 Anemia, unspecified: Secondary | ICD-10-CM

## 2023-01-08 DIAGNOSIS — D696 Thrombocytopenia, unspecified: Secondary | ICD-10-CM | POA: Diagnosis not present

## 2023-01-08 DIAGNOSIS — K922 Gastrointestinal hemorrhage, unspecified: Secondary | ICD-10-CM | POA: Insufficient documentation

## 2023-01-08 LAB — BASIC METABOLIC PANEL - CANCER CENTER ONLY
Anion gap: 9 (ref 5–15)
BUN: 20 mg/dL (ref 8–23)
CO2: 24 mmol/L (ref 22–32)
Calcium: 8.7 mg/dL — ABNORMAL LOW (ref 8.9–10.3)
Chloride: 104 mmol/L (ref 98–111)
Creatinine: 1.13 mg/dL — ABNORMAL HIGH (ref 0.44–1.00)
GFR, Estimated: 54 mL/min — ABNORMAL LOW (ref >60)
Glucose, Bld: 260 mg/dL — ABNORMAL HIGH (ref 70–99)
Potassium: 3.9 mmol/L (ref 3.5–5.1)
Sodium: 137 mmol/L (ref 135–145)

## 2023-01-08 LAB — CBC WITH DIFFERENTIAL (CANCER CENTER ONLY)
Abs Immature Granulocytes: 0 10*3/uL (ref 0.00–0.07)
Basophils Absolute: 0 10*3/uL (ref 0.0–0.1)
Basophils Relative: 1 %
Eosinophils Absolute: 0 10*3/uL (ref 0.0–0.5)
Eosinophils Relative: 1 %
HCT: 23.3 % — ABNORMAL LOW (ref 36.0–46.0)
Hemoglobin: 7 g/dL — ABNORMAL LOW (ref 12.0–15.0)
Immature Granulocytes: 0 %
Lymphocytes Relative: 19 %
Lymphs Abs: 0.6 10*3/uL — ABNORMAL LOW (ref 0.7–4.0)
MCH: 24 pg — ABNORMAL LOW (ref 26.0–34.0)
MCHC: 30 g/dL (ref 30.0–36.0)
MCV: 79.8 fL — ABNORMAL LOW (ref 80.0–100.0)
Monocytes Absolute: 0.3 10*3/uL (ref 0.1–1.0)
Monocytes Relative: 8 %
Neutro Abs: 2.3 10*3/uL (ref 1.7–7.7)
Neutrophils Relative %: 71 %
Platelet Count: 112 10*3/uL — ABNORMAL LOW (ref 150–400)
RBC: 2.92 MIL/uL — ABNORMAL LOW (ref 3.87–5.11)
RDW: 17.8 % — ABNORMAL HIGH (ref 11.5–15.5)
WBC Count: 3.2 10*3/uL — ABNORMAL LOW (ref 4.0–10.5)
nRBC: 0 % (ref 0.0–0.2)

## 2023-01-08 LAB — VITAMIN B12: Vitamin B-12: 658 pg/mL (ref 180–914)

## 2023-01-08 LAB — PROTIME-INR
INR: 1.4 — ABNORMAL HIGH (ref 0.8–1.2)
Prothrombin Time: 16.8 seconds — ABNORMAL HIGH (ref 11.4–15.2)

## 2023-01-08 LAB — SAMPLE TO BLOOD BANK

## 2023-01-08 LAB — FOLATE: Folate: 12.7 ng/mL (ref >5.9)

## 2023-01-08 MED ORDER — SODIUM CHLORIDE 0.9 % IV SOLN
Freq: Once | INTRAVENOUS | Status: AC
  Filled 2023-01-08: qty 250

## 2023-01-08 MED ORDER — SODIUM CHLORIDE 0.9 % IV SOLN
200.0000 mg | Freq: Once | INTRAVENOUS | Status: AC
  Administered 2023-01-08: 200 mg via INTRAVENOUS
  Filled 2023-01-08: qty 200

## 2023-01-08 NOTE — Patient Instructions (Signed)

## 2023-01-11 ENCOUNTER — Encounter: Payer: Self-pay | Admitting: *Deleted

## 2023-01-11 LAB — KAPPA/LAMBDA LIGHT CHAINS
Kappa free light chain: 57 mg/L — ABNORMAL HIGH (ref 3.3–19.4)
Kappa, lambda light chain ratio: 1.56 (ref 0.26–1.65)
Lambda free light chains: 36.6 mg/L — ABNORMAL HIGH (ref 5.7–26.3)

## 2023-01-12 ENCOUNTER — Ambulatory Visit: Payer: Medicare Other | Admitting: Anesthesiology

## 2023-01-12 ENCOUNTER — Encounter
Admission: RE | Disposition: A | Discharge status: Home / Self Care | Payer: Self-pay | Source: Home / Self Care | Attending: Gastroenterology

## 2023-01-12 ENCOUNTER — Encounter: Payer: Self-pay | Admitting: *Deleted

## 2023-01-12 ENCOUNTER — Ambulatory Visit: Payer: Medicaid Other

## 2023-01-12 ENCOUNTER — Ambulatory Visit
Admission: RE | Admit: 2023-01-12 | Discharge: 2023-01-12 | Disposition: A | Discharge status: Home / Self Care | Payer: Medicare Other | Attending: Gastroenterology | Admitting: Gastroenterology

## 2023-01-12 DIAGNOSIS — Z9049 Acquired absence of other specified parts of digestive tract: Secondary | ICD-10-CM | POA: Diagnosis not present

## 2023-01-12 DIAGNOSIS — Z90710 Acquired absence of both cervix and uterus: Secondary | ICD-10-CM | POA: Insufficient documentation

## 2023-01-12 DIAGNOSIS — K7460 Unspecified cirrhosis of liver: Secondary | ICD-10-CM | POA: Diagnosis not present

## 2023-01-12 DIAGNOSIS — E119 Type 2 diabetes mellitus without complications: Secondary | ICD-10-CM | POA: Diagnosis not present

## 2023-01-12 DIAGNOSIS — K766 Portal hypertension: Secondary | ICD-10-CM | POA: Diagnosis not present

## 2023-01-12 DIAGNOSIS — Z7984 Long term (current) use of oral hypoglycemic drugs: Secondary | ICD-10-CM | POA: Insufficient documentation

## 2023-01-12 DIAGNOSIS — I10 Essential (primary) hypertension: Secondary | ICD-10-CM | POA: Diagnosis not present

## 2023-01-12 DIAGNOSIS — K219 Gastro-esophageal reflux disease without esophagitis: Secondary | ICD-10-CM | POA: Insufficient documentation

## 2023-01-12 DIAGNOSIS — K31811 Angiodysplasia of stomach and duodenum with bleeding: Secondary | ICD-10-CM | POA: Insufficient documentation

## 2023-01-12 DIAGNOSIS — K7581 Nonalcoholic steatohepatitis (NASH): Secondary | ICD-10-CM | POA: Diagnosis not present

## 2023-01-12 DIAGNOSIS — K3189 Other diseases of stomach and duodenum: Secondary | ICD-10-CM | POA: Insufficient documentation

## 2023-01-12 HISTORY — DX: Unspecified cirrhosis of liver: K74.60

## 2023-01-12 HISTORY — DX: Gastro-esophageal reflux disease without esophagitis: K21.9

## 2023-01-12 HISTORY — DX: Depression, unspecified: F32.A

## 2023-01-12 HISTORY — DX: Age-related osteoporosis without current pathological fracture: M81.0

## 2023-01-12 HISTORY — DX: Other cirrhosis of liver: K74.69

## 2023-01-12 HISTORY — PX: ESOPHAGOGASTRODUODENOSCOPY (EGD) WITH PROPOFOL: SHX5813

## 2023-01-12 LAB — GLUCOSE, CAPILLARY: Glucose-Capillary: 159 mg/dL — ABNORMAL HIGH (ref 70–99)

## 2023-01-12 SURGERY — ESOPHAGOGASTRODUODENOSCOPY (EGD) WITH PROPOFOL
Anesthesia: General

## 2023-01-12 MED ORDER — PHENYLEPHRINE HCL (PRESSORS) 10 MG/ML IV SOLN
INTRAVENOUS | Status: DC | PRN
  Administered 2023-01-12: 80 ug via INTRAVENOUS

## 2023-01-12 MED ORDER — LIDOCAINE HCL (PF) 2 % IJ SOLN
INTRAMUSCULAR | Status: AC
  Filled 2023-01-12: qty 5

## 2023-01-12 MED ORDER — SODIUM CHLORIDE 0.9 % IV SOLN: INTRAVENOUS | Status: DC

## 2023-01-12 MED ORDER — DEXMEDETOMIDINE HCL IN NACL 80 MCG/20ML IV SOLN
INTRAVENOUS | Status: DC | PRN
  Administered 2023-01-12: 8 ug via BUCCAL

## 2023-01-12 MED ORDER — PROPOFOL 10 MG/ML IV BOLUS
INTRAVENOUS | Status: DC | PRN
  Administered 2023-01-12: 100 mg via INTRAVENOUS
  Administered 2023-01-12: 150 ug/kg/min via INTRAVENOUS

## 2023-01-12 MED ORDER — LIDOCAINE HCL (CARDIAC) PF 100 MG/5ML IV SOSY
PREFILLED_SYRINGE | INTRAVENOUS | Status: DC | PRN
  Administered 2023-01-12: 100 mg via INTRAVENOUS

## 2023-01-12 NOTE — Interval H&P Note (Signed)
History and Physical Interval Note:  01/12/2023 9:54 AM  Margaret [name]  has presented today for surgery, with the diagnosis of IDA.  The various methods of treatment have been discussed with the patient and family. After consideration of risks, benefits and other options for treatment, the patient has consented to  Procedure(s) with comments: ESOPHAGOGASTRODUODENOSCOPY (EGD) WITH PROPOFOL (N/A) - DM as a surgical intervention.  The patient's history has been reviewed, patient examined, no change in status, stable for surgery.  I have reviewed the patient's chart and labs.  Questions were answered to the patient's satisfaction.     Lesly Rubenstein  Ok to proceed with EGD

## 2023-01-12 NOTE — Op Note (Signed)
[facility] Patient Name: Margaret [name] Procedure Date: 01/12/2023 9:33 AM MRN: [id number] Account #: [id number] Date of Birth: Mar 24, 1956 Admit Type: Outpatient Age: 67 Room: [facility] ENDO ROOM 1 Gender: Female Note Status: Finalized Instrument Name: Altamese Cabal Endoscope [id number] Procedure:             Upper GI endoscopy Indications:           Watermelon stomach (GAVE syndrome) Providers:             Andrey Farmer MD, MD Medicines:             Monitored Anesthesia Care Complications:         No immediate complications. Procedure:             Pre-Anesthesia Assessment:                        - Prior to the procedure, a History and Physical was                         performed, and patient medications and allergies were                         reviewed. The patient is competent. The risks and                         benefits of the procedure and the sedation options and                         risks were discussed with the patient. All questions                         were answered and informed consent was obtained.                         Patient identification and proposed procedure were                         verified by the physician, the nurse, the                         anesthesiologist, the anesthetist and the technician                         in the endoscopy suite. Mental Status Examination:                         alert and oriented. Airway Examination: normal                         oropharyngeal airway and neck mobility. Respiratory                         Examination: clear to auscultation. CV Examination:                         normal. Prophylactic Antibiotics: The patient does not                         require prophylactic antibiotics. Prior  Anticoagulants: The patient has taken no anticoagulant                         or antiplatelet agents. ASA Grade Assessment: III - A                         patient with  severe systemic disease. After reviewing                         the risks and benefits, the patient was deemed in                         satisfactory condition to undergo the procedure. The                         anesthesia plan was to use monitored anesthesia care                         (MAC). Immediately prior to administration of                         medications, the patient was re-assessed for adequacy                         to receive sedatives. The heart rate, respiratory                         rate, oxygen saturations, blood pressure, adequacy of                         pulmonary ventilation, and response to care were                         monitored throughout the procedure. The physical                         status of the patient was re-assessed after the                         procedure.                        After obtaining informed consent, the endoscope was                         passed under direct vision. Throughout the procedure,                         the patient's blood pressure, pulse, and oxygen                         saturations were monitored continuously. The Endoscope                         was introduced through the mouth, and advanced to the                         second part of duodenum. The upper GI endoscopy was  accomplished without difficulty. The patient tolerated                         the procedure well. Findings:      The examined esophagus was normal.      Multiple small angioectasias with bleeding were found in the gastric       body. Coagulation for hemostasis using argon beam was successful.       Estimated blood loss: none.      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 Coagulation for tissue destruction using argon beam       was partially successful. There is extensive GAVE which would require       multiple sessions for treatment. Estimated blood  loss was minimal.      Mild portal hypertensive gastropathy was found in the stomach.      The examined duodenum was normal. Impression:            - Normal esophagus.                        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                         Treated with argon beam coagulation.                        - Gastric antral vascular ectasia without bleeding.                         Treated with argon beam coagulation.                        - Portal hypertensive gastropathy.                        - Normal examined duodenum.                        - No specimens collected. Recommendation:        - Discharge patient to home.                        - Resume previous diet.                        - Continue present medications.                        - Return to referring physician as previously                         scheduled. Procedure Code(s):     --- Professional ---                        [phone], Esophagogastroduodenoscopy, flexible,                         transoral; with ablation of tumor(s), polyp(s), or                         other lesion(s) (includes  pre- and post-dilation and                         guide wire passage, when performed)                        [id number], 59, Esophagogastroduodenoscopy, flexible,                         transoral; with control of bleeding, any method Diagnosis Code(s):     --- Professional ---                        K31.811, Angiodysplasia of stomach and duodenum with                         bleeding                        K76.6, Portal hypertension                        K31.89, Other diseases of stomach and duodenum CPT copyright 2022 American Medical Association. All rights reserved. The codes documented in this report are preliminary and upon coder review may  be revised to meet current compliance requirements. Andrey Farmer MD, MD 01/12/2023 10:17:28 AM Number of Addenda: 0 Note Initiated On: 01/12/2023 9:33 AM Estimated  Blood Loss:  Estimated blood loss was minimal.      [facility]

## 2023-01-12 NOTE — Transfer of Care (Signed)
Immediate Anesthesia Transfer of Care Note  Patient: Margaret [name]  Procedure(s) Performed: ESOPHAGOGASTRODUODENOSCOPY (EGD) WITH PROPOFOL  Patient Location: PACU  Anesthesia Type:General  Level of Consciousness: drowsy  Airway & Oxygen Therapy: Patient Spontanous Breathing  Post-op Assessment: Report given to RN and Post -op Vital signs reviewed and stable  Post vital signs: Reviewed and stable  Last Vitals:  Vitals Value Taken Time  BP 110/61 01/12/23 1017  Temp 36.4 C 01/12/23 1015  Pulse 69 01/12/23 1016  Resp 16 01/12/23 1016  SpO2 95 % 01/12/23 1016  Vitals shown include unvalidated device data.  Last Pain:  Vitals:   01/12/23 1015  TempSrc: Temporal  PainSc: Asleep         Complications: No notable events documented.

## 2023-01-12 NOTE — H&P (Signed)
Outpatient short stay form Pre-procedure 01/12/2023  Lesly Rubenstein, MD  Primary Physician: Rusty Aus, MD  Reason for visit:  GAVE  History of present illness:    67 y/o lady with history of MASLD cirrhosis here for EGD for treatment of GAVE. No blood thinners. No family history of GI malignancies. History of cholecystectomy. Platelets 112.    Current Facility-Administered Medications:    0.9 %  sodium chloride infusion, , Intravenous, Continuous, Jesenia Spera, Hilton Cork, MD, Last Rate: 20 mL/hr at 01/12/23 0946, Continued from Pre-op at 01/12/23 0946  Medications Prior to Admission  Medication Sig Dispense Refill Last Dose   alendronate (FOSAMAX) 35 MG tablet Take 35 mg by mouth every 7 (seven) days. Take with a full glass of water on an empty stomach.   01/11/2023   amLODipine (NORVASC) 2.5 MG tablet Take by mouth.   01/11/2023   ARIPiprazole (ABILIFY) 10 MG tablet Take 1 tablet (10 mg total) by mouth daily. 90 tablet 0 01/11/2023   benztropine (COGENTIN) 0.5 MG tablet Take 1 tablet (0.5 mg total) by mouth daily as needed for tremors. 15 tablet 1 01/11/2023   escitalopram (LEXAPRO) 10 MG tablet Take 1 tablet (10 mg total) by mouth daily. 90 tablet 0 01/11/2023   furosemide (LASIX) 20 MG tablet Take 10 mg by mouth daily as needed.   01/11/2023   HYDROcodone-acetaminophen (NORCO/VICODIN) 5-325 MG tablet Take 1 tablet by mouth every 6 (six) hours as needed for severe pain. Must last 30 days. 120 tablet 0 01/11/2023   [START ON 02/03/2023] HYDROcodone-acetaminophen (NORCO/VICODIN) 5-325 MG tablet Take 1 tablet by mouth every 6 (six) hours as needed for severe pain. Must last 30 days. 120 tablet 0 01/11/2023   hydrOXYzine (VISTARIL) 25 MG capsule Take 25 mg by mouth daily as needed for anxiety.   Past Week   lamoTRIgine (LAMICTAL) 200 MG tablet Take 1 tablet (200 mg total) by mouth daily. 90 tablet 0 01/11/2023   meclizine (ANTIVERT) 25 MG tablet Take by mouth 2 (two) times daily as needed.    01/11/2023   metFORMIN (GLUCOPHAGE) 500 MG tablet Take 500 mg by mouth 2 (two) times daily with a meal.   01/11/2023   ondansetron (ZOFRAN) 8 MG tablet Take 1 tablet (8 mg total) by mouth every 8 (eight) hours as needed for nausea or vomiting. 40 tablet 0 Past Month   pantoprazole (PROTONIX) 40 MG tablet Take 40 mg by mouth daily.   01/11/2023   polyethylene glycol powder (GLYCOLAX/MIRALAX) 17 GM/SCOOP powder SMARTSIG:17 Gram(s) By Mouth 1-2 Times Daily   01/11/2023   rifaximin (XIFAXAN) 550 MG TABS tablet Take by mouth.   01/11/2023   spironolactone (ALDACTONE) 25 MG tablet Take 25 mg by mouth daily.   01/11/2023   tizanidine (ZANAFLEX) 2 MG capsule Take 2 mg by mouth 3 (three) times daily as needed for muscle spasms. Takes differently   Past Month   glucose blood (PRECISION QID TEST) test strip 1 each.      insulin glargine (LANTUS) 100 UNIT/ML Solostar Pen Inject into the skin. (Patient not taking: Reported on 01/12/2023)   Not Taking   Insulin Glargine w/ Trans Port 100 UNIT/ML SOPN Inject into the skin.      Insulin Glargine w/ Trans Port 100 UNIT/ML SOPN Inject into the skin.      OZEMPIC, 1 MG/DOSE, 4 MG/3ML SOPN Inject 1 mg into the skin once a week.   12/30/2022   SURE COMFORT PEN NEEDLES 31G X 8 MM  MISC USE AS DIRECTED TWICEEDAILY.T        Allergies  Allergen Reactions   Lithium Nausea And Vomiting and Other (See Comments)   Penicillins Hives    Resulted in hospitalization   Sulfa Antibiotics Hives    Resulted in hospitalization   Benzonatate Other (See Comments)     Past Medical History:  Diagnosis Date   Bipolar 1 disorder ([place])    Depression    Diabetes mellitus without complication ([place])    Type 2   GERD (gastroesophageal reflux disease)    Hypertension    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H ([place])    Osteoporosis     Review of systems:  Otherwise negative.    Physical Exam  Gen: Alert, oriented. Appears stated age.  HEENT: PERRLA. Lungs: No respiratory distress CV:  RRR Abd: soft, benign, no masses Ext: No edema    Planned procedures: Proceed with EGD. The patient understands the nature of the planned procedure, indications, risks, alternatives and potential complications including but not limited to bleeding, infection, perforation, damage to internal organs and possible oversedation/side effects from anesthesia. The patient agrees and gives consent to proceed.  Please refer to procedure notes for findings, recommendations and patient disposition/instructions.     Lesly Rubenstein, MD [facility]

## 2023-01-12 NOTE — Anesthesia Preprocedure Evaluation (Signed)
Anesthesia Evaluation  Patient identified by MRN, date of birth, ID band Patient awake    Reviewed: Allergy & Precautions, NPO status , Patient's Chart, lab work & pertinent test results  Airway Mallampati: III  TM Distance: >3 FB Neck ROM: full    Dental  (+) Chipped, Partial Upper, Missing   Pulmonary neg pulmonary ROS, former smoker   Pulmonary exam normal        Cardiovascular hypertension, Normal cardiovascular exam     Neuro/Psych  PSYCHIATRIC DISORDERS Anxiety Depression Bipolar Disorder    Neuromuscular disease negative neurological ROS  negative psych ROS   GI/Hepatic ,GERD  Medicated and Controlled,,(+) Hepatitis -  Endo/Other  negative endocrine ROSdiabetes    Renal/GU Renal diseasenegative Renal ROS  negative genitourinary   Musculoskeletal  (+) Arthritis ,    Abdominal   Peds  Hematology  (+) Blood dyscrasia, anemia   Anesthesia Other Findings Past Medical History: No date: Bipolar 1 disorder (HCC) No date: Depression No date: Diabetes mellitus without complication (HCC)     Comment:  Type 2 No date: GERD (gastroesophageal reflux disease) No date: Hypertension No date: 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H ([place]) No date: Osteoporosis  Past Surgical History: No date: ABDOMINAL HYSTERECTOMY No date: CHOLECYSTECTOMY 06/23/2019: COLONOSCOPY WITH PROPOFOL; N/A     Comment:  Procedure: COLONOSCOPY WITH PROPOFOL;  Surgeon: Jonathon Bellows, MD;  Location: [facility] ENDOSCOPY;  Service:               Gastroenterology;  Laterality: N/A; 12/08/2022: COLONOSCOPY WITH PROPOFOL; N/A     Comment:  Procedure: COLONOSCOPY WITH PROPOFOL;  Surgeon:               Lesly Rubenstein, MD;  Location: [facility] ENDOSCOPY;                Service: Endoscopy;  Laterality: N/A; 06/23/2019: ESOPHAGOGASTRODUODENOSCOPY (EGD) WITH PROPOFOL; N/A     Comment:  Procedure: ESOPHAGOGASTRODUODENOSCOPY (EGD) WITH                PROPOFOL;  Surgeon: Jonathon Bellows, MD;  Location: [facility]               ENDOSCOPY;  Service: Gastroenterology;  Laterality: N/A; 12/08/2022: ESOPHAGOGASTRODUODENOSCOPY (EGD) WITH PROPOFOL; N/A     Comment:  Procedure: ESOPHAGOGASTRODUODENOSCOPY (EGD) WITH               PROPOFOL;  Surgeon: Lesly Rubenstein, MD;  Location:               [facility] ENDOSCOPY;  Service: Endoscopy;  Laterality: N/A;                DM, OZEMPIC No date: JOINT REPLACEMENT No date: TONSILLECTOMY  BMI    Body Mass Index: 32.81 kg/m      Reproductive/Obstetrics negative OB ROS                             Anesthesia Physical Anesthesia Plan  ASA: 3  Anesthesia Plan: General   Post-op Pain Management:    Induction: Intravenous  PONV Risk Score and Plan: Propofol infusion and TIVA  Airway Management Planned: Natural Airway and Nasal Cannula  Additional Equipment:   Intra-op Plan:   Post-operative Plan:   Informed Consent: I have reviewed the patients History and Physical, chart, labs and discussed the procedure including the risks, benefits and alternatives for  the proposed anesthesia with the patient or authorized representative who has indicated his/her understanding and acceptance.     Dental Advisory Given  Plan Discussed with: Anesthesiologist, CRNA and Surgeon  Anesthesia Plan Comments: (Patient consented for risks of anesthesia including but not limited to:  - adverse reactions to medications - risk of airway placement if required - damage to eyes, teeth, lips or other oral mucosa - nerve damage due to positioning  - sore throat or hoarseness - Damage to heart, brain, nerves, lungs, other parts of body or loss of life  Patient voiced understanding.)        Anesthesia Quick Evaluation

## 2023-01-13 ENCOUNTER — Encounter: Payer: Self-pay | Admitting: Gastroenterology

## 2023-01-14 LAB — MULTIPLE MYELOMA PANEL, SERUM
Albumin SerPl Elph-Mcnc: 3.4 g/dL (ref 2.9–4.4)
Albumin/Glob SerPl: 1.4 (ref 0.7–1.7)
Alpha 1: 0.5 g/dL — ABNORMAL HIGH (ref 0.0–0.4)
Alpha2 Glob SerPl Elph-Mcnc: 0.5 g/dL (ref 0.4–1.0)
B-Globulin SerPl Elph-Mcnc: 1 g/dL (ref 0.7–1.3)
Gamma Glob SerPl Elph-Mcnc: 0.6 g/dL (ref 0.4–1.8)
Globulin, Total: 2.6 g/dL (ref 2.2–3.9)
IgA: 371 mg/dL — ABNORMAL HIGH (ref 87–352)
IgG (Immunoglobin G), Serum: 668 mg/dL (ref 586–1602)
IgM (Immunoglobulin M), Srm: 62 mg/dL (ref 26–217)
Total Protein ELP: 6 g/dL (ref 6.0–8.5)

## 2023-01-14 MED FILL — Iron Sucrose Inj 20 MG/ML (Fe Equiv): INTRAVENOUS | Qty: 10 | Status: AC

## 2023-01-15 ENCOUNTER — Inpatient Hospital Stay: Payer: Medicare Other

## 2023-01-15 VITALS — BP 126/46 | HR 64 | Temp 98.2°F | Resp 16

## 2023-01-15 DIAGNOSIS — D500 Iron deficiency anemia secondary to blood loss (chronic): Secondary | ICD-10-CM

## 2023-01-15 MED ORDER — SODIUM CHLORIDE 0.9 % IV SOLN
200.0000 mg | Freq: Once | INTRAVENOUS | Status: AC
  Administered 2023-01-15: 200 mg via INTRAVENOUS
  Filled 2023-01-15: qty 200

## 2023-01-15 MED ORDER — SODIUM CHLORIDE 0.9 % IV SOLN
Freq: Once | INTRAVENOUS | Status: AC
  Filled 2023-01-15: qty 250

## 2023-01-15 NOTE — Anesthesia Postprocedure Evaluation (Signed)
Anesthesia Post Note  Patient: Margaret [name]  Procedure(s) Performed: ESOPHAGOGASTRODUODENOSCOPY (EGD) WITH PROPOFOL  Patient location during evaluation: Endoscopy Anesthesia Type: General Level of consciousness: awake and alert Pain management: pain level controlled Vital Signs Assessment: post-procedure vital signs reviewed and stable Respiratory status: spontaneous breathing, nonlabored ventilation and respiratory function stable Cardiovascular status: blood pressure returned to baseline and stable Postop Assessment: no apparent nausea or vomiting Anesthetic complications: no  No notable events documented.   Last Vitals:  Vitals:   01/12/23 1015 01/12/23 1025  BP: (!) 117/47 (!) 112/42  Pulse: 70   Resp: 15   Temp: 36.4 C   SpO2: 95%     Last Pain:  Vitals:   01/13/23 0740  TempSrc:   PainSc: 0-No pain                 Alphonsus Sias

## 2023-01-18 ENCOUNTER — Other Ambulatory Visit: Payer: Self-pay

## 2023-01-18 DIAGNOSIS — D696 Thrombocytopenia, unspecified: Secondary | ICD-10-CM

## 2023-01-18 MED FILL — Iron Sucrose Inj 20 MG/ML (Fe Equiv): INTRAVENOUS | Qty: 10 | Status: AC

## 2023-01-19 ENCOUNTER — Ambulatory Visit: Payer: Medicaid Other

## 2023-01-19 ENCOUNTER — Inpatient Hospital Stay (HOSPITAL_BASED_OUTPATIENT_CLINIC_OR_DEPARTMENT_OTHER): Payer: Medicare Other | Admitting: Nurse Practitioner

## 2023-01-19 ENCOUNTER — Inpatient Hospital Stay: Payer: Medicare Other

## 2023-01-19 ENCOUNTER — Encounter: Payer: Self-pay | Admitting: Nurse Practitioner

## 2023-01-19 VITALS — BP 123/45 | HR 70

## 2023-01-19 VITALS — BP 136/40 | HR 70 | Temp 97.8°F | Resp 20 | Wt 167.1 lb

## 2023-01-19 DIAGNOSIS — D696 Thrombocytopenia, unspecified: Secondary | ICD-10-CM

## 2023-01-19 DIAGNOSIS — D649 Anemia, unspecified: Secondary | ICD-10-CM

## 2023-01-19 DIAGNOSIS — D500 Iron deficiency anemia secondary to blood loss (chronic): Secondary | ICD-10-CM | POA: Diagnosis not present

## 2023-01-19 LAB — CBC WITH DIFFERENTIAL/PLATELET
Abs Immature Granulocytes: 0.03 10*3/uL (ref 0.00–0.07)
Basophils Absolute: 0 10*3/uL (ref 0.0–0.1)
Basophils Relative: 0 %
Eosinophils Absolute: 0.1 10*3/uL (ref 0.0–0.5)
Eosinophils Relative: 1 %
HCT: 27.6 % — ABNORMAL LOW (ref 36.0–46.0)
Hemoglobin: 8.5 g/dL — ABNORMAL LOW (ref 12.0–15.0)
Immature Granulocytes: 1 %
Lymphocytes Relative: 20 %
Lymphs Abs: 0.9 10*3/uL (ref 0.7–4.0)
MCH: 24.6 pg — ABNORMAL LOW (ref 26.0–34.0)
MCHC: 30.8 g/dL (ref 30.0–36.0)
MCV: 79.8 fL — ABNORMAL LOW (ref 80.0–100.0)
Monocytes Absolute: 0.5 10*3/uL (ref 0.1–1.0)
Monocytes Relative: 11 %
Neutro Abs: 3 10*3/uL (ref 1.7–7.7)
Neutrophils Relative %: 67 %
Platelets: 130 10*3/uL — ABNORMAL LOW (ref 150–400)
RBC: 3.46 MIL/uL — ABNORMAL LOW (ref 3.87–5.11)
RDW: 19.9 % — ABNORMAL HIGH (ref 11.5–15.5)
WBC: 4.5 10*3/uL (ref 4.0–10.5)
nRBC: 0 % (ref 0.0–0.2)

## 2023-01-19 LAB — PROTIME-INR
INR: 1.3 — ABNORMAL HIGH (ref 0.8–1.2)
Prothrombin Time: 15.6 seconds — ABNORMAL HIGH (ref 11.4–15.2)

## 2023-01-19 LAB — SAMPLE TO BLOOD BANK

## 2023-01-19 LAB — BASIC METABOLIC PANEL - CANCER CENTER ONLY
Anion gap: 10 (ref 5–15)
BUN: 22 mg/dL (ref 8–23)
CO2: 24 mmol/L (ref 22–32)
Calcium: 9.3 mg/dL (ref 8.9–10.3)
Chloride: 103 mmol/L (ref 98–111)
Creatinine: 1.23 mg/dL — ABNORMAL HIGH (ref 0.44–1.00)
GFR, Estimated: 49 mL/min — ABNORMAL LOW (ref >60)
Glucose, Bld: 105 mg/dL — ABNORMAL HIGH (ref 70–99)
Potassium: 4 mmol/L (ref 3.5–5.1)
Sodium: 137 mmol/L (ref 135–145)

## 2023-01-19 LAB — VITAMIN B12: Vitamin B-12: 613 pg/mL (ref 180–914)

## 2023-01-19 LAB — FOLATE: Folate: 11.1 ng/mL (ref >5.9)

## 2023-01-19 MED ORDER — SODIUM CHLORIDE 0.9 % IV SOLN
Freq: Once | INTRAVENOUS | Status: AC
  Filled 2023-01-19: qty 250

## 2023-01-19 MED ORDER — SODIUM CHLORIDE 0.9 % IV SOLN
200.0000 mg | Freq: Once | INTRAVENOUS | Status: AC
  Administered 2023-01-19: 200 mg via INTRAVENOUS
  Filled 2023-01-19: qty 200

## 2023-01-19 NOTE — Progress Notes (Signed)
[name] NOTE  Patient Care Team: Rusty Aus, MD as PCP - General (Internal Medicine) Earlie Server, MD as Consulting Physician (Oncology)  CHIEF COMPLAINTS/PURPOSE OF CONSULTATION: Anemia/thrombocytopenia   HEMATOLOGY HISTORY 06/21/2019-06/24/2019 due to severe anemia; hemoglobin of 5.7 upon admission; patient received 2 units of blood transfusion, 3 doses of IV iron. EGD and colonoscopy were performed during the hospitalization EGD and colonoscopy on 06/23/2019. [Dr.Anna; [facility]] EGD showed no abnormalities and the colonoscopy showed 3 sessile diminutive polyps which were resected completely.  Arteries of the duodenum were normal.  3 colon polyps resected were serrated polyp x2 and a tubular adenoma x1. No active source of bleeding was noted.    Patient was scheduled for capsule endoscopy as outpatient but held d/t finding of GAVE.   # Cirrhosis NAFLD- [? [facility]]  EGD with Dr. Haig Prophet 01/12/23  CKD- stage III  HISTORY OF PRESENTING ILLNESS: ambulating with rolling walker [arthritis].   Margaret [name] 67 y.o.  female pleasant patient was been with a history of cirrhosis/nonalcoholic fatty liver is here for follow-up of anemia/thrombocytopenia. She saw Dr Rogue Bussing on 12/29/22. In interim she underwent repeat EGD with Dr. Haig Prophet for [place] on 01/12/23. She has received an additional 2 doses of IV Venofer since tha ttime. Continues to deny black or bloody stools. Energy has improved. Tolerating iron well without significant side effects.    Review of Systems  Constitutional:  Positive for malaise/fatigue. Negative for chills, diaphoresis, fever and weight loss.  HENT:  Negative for nosebleeds and sore throat.   Eyes:  Negative for double vision.  Respiratory:  Positive for shortness of breath. Negative for cough, hemoptysis, sputum production and wheezing.   Cardiovascular:  Negative for chest pain, palpitations, orthopnea and leg swelling.  Gastrointestinal:  Negative  for abdominal pain, blood in stool, constipation, diarrhea, heartburn, melena, nausea and vomiting.  Genitourinary:  Negative for dysuria, frequency, hematuria and urgency.  Musculoskeletal:  Negative for back pain and joint pain.  Skin:  Negative for itching and rash.  Neurological:  Negative for dizziness, tingling, focal weakness, weakness and headaches.  Endo/Heme/Allergies:  Does not bruise/bleed easily.  Psychiatric/Behavioral:  Negative for depression. The patient is not nervous/anxious and does not have insomnia.    MEDICAL HISTORY:  Past Medical History:  Diagnosis Date   Bipolar 1 disorder ([place])    Depression    Diabetes mellitus without complication (HCC)    Type 2   GERD (gastroesophageal reflux disease)    Hypertension    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H ([place])    Osteoporosis    SURGICAL HISTORY: Past Surgical History:  Procedure Laterality Date   ABDOMINAL HYSTERECTOMY     CHOLECYSTECTOMY     COLONOSCOPY WITH PROPOFOL N/A 06/23/2019   Procedure: COLONOSCOPY WITH PROPOFOL;  Surgeon: Jonathon Bellows, MD;  Location: [facility] ENDOSCOPY;  Service: Gastroenterology;  Laterality: N/A;   COLONOSCOPY WITH PROPOFOL N/A 12/08/2022   Procedure: COLONOSCOPY WITH PROPOFOL;  Surgeon: Lesly Rubenstein, MD;  Location: [facility] ENDOSCOPY;  Service: Endoscopy;  Laterality: N/A;   ESOPHAGOGASTRODUODENOSCOPY (EGD) WITH PROPOFOL N/A 06/23/2019   Procedure: ESOPHAGOGASTRODUODENOSCOPY (EGD) WITH PROPOFOL;  Surgeon: Jonathon Bellows, MD;  Location: [facility] ENDOSCOPY;  Service: Gastroenterology;  Laterality: N/A;   ESOPHAGOGASTRODUODENOSCOPY (EGD) WITH PROPOFOL N/A 12/08/2022   Procedure: ESOPHAGOGASTRODUODENOSCOPY (EGD) WITH PROPOFOL;  Surgeon: Lesly Rubenstein, MD;  Location: [facility] ENDOSCOPY;  Service: Endoscopy;  Laterality: N/A;  DM, OZEMPIC   ESOPHAGOGASTRODUODENOSCOPY (EGD) WITH PROPOFOL N/A 01/12/2023   Procedure: ESOPHAGOGASTRODUODENOSCOPY (EGD) WITH PROPOFOL;  Surgeon: Lesly Rubenstein, MD;   Location: [facility] ENDOSCOPY;  Service: Endoscopy;  Laterality: N/A;  DM   JOINT REPLACEMENT     TONSILLECTOMY     SOCIAL HISTORY: Social History   Socioeconomic History   Marital status: Divorced    Spouse name: Not on file   Number of children: 2   Years of education: Not on file   Highest education level: Not on file  Occupational History   Not on file  Tobacco Use   Smoking status: Former    Types: Cigarettes    Quit date: 03/03/2011    Years since quitting: 11.8   Smokeless tobacco: Never  Vaping Use   Vaping Use: Never used  Substance and Sexual Activity   Alcohol use: Not Currently   Drug use: Not Currently   Sexual activity: Not Currently  Other Topics Concern   Not on file  Social History Narrative   Not on file   Social Determinants of Health   Financial Resource Strain: Not on file  Food Insecurity: Not on file  Transportation Needs: Not on file  Physical Activity: Not on file  Stress: Not on file  Social Connections: Not on file  Intimate Partner Violence: Not on file   FAMILY HISTORY: Family History  Problem Relation Age of Onset   Mental illness Father    Bipolar disorder Brother    Bipolar disorder Paternal Uncle    Bipolar disorder Paternal Uncle    ALLERGIES:  is allergic to lithium, penicillins, sulfa antibiotics, and benzonatate.  MEDICATIONS:  Current Outpatient Medications  Medication Sig Dispense Refill   alendronate (FOSAMAX) 35 MG tablet Take 35 mg by mouth every 7 (seven) days. Take with a full glass of water on an empty stomach.     amLODipine (NORVASC) 2.5 MG tablet Take by mouth.     ARIPiprazole (ABILIFY) 10 MG tablet Take 1 tablet (10 mg total) by mouth daily. 90 tablet 0   benztropine (COGENTIN) 0.5 MG tablet Take 1 tablet (0.5 mg total) by mouth daily as needed for tremors. 15 tablet 1   escitalopram (LEXAPRO) 10 MG tablet Take 1 tablet (10 mg total) by mouth daily. 90 tablet 0   furosemide (LASIX) 20 MG tablet Take 10 mg by mouth  daily as needed.     HYDROcodone-acetaminophen (NORCO/VICODIN) 5-325 MG tablet Take 1 tablet by mouth every 6 (six) hours as needed for severe pain. Must last 30 days. 120 tablet 0   [START ON 02/03/2023] HYDROcodone-acetaminophen (NORCO/VICODIN) 5-325 MG tablet Take 1 tablet by mouth every 6 (six) hours as needed for severe pain. Must last 30 days. 120 tablet 0   hydrOXYzine (VISTARIL) 25 MG capsule Take 25 mg by mouth daily as needed for anxiety.     insulin glargine (LANTUS) 100 UNIT/ML Solostar Pen Inject into the skin. (Patient not taking: Reported on 01/12/2023)     Insulin Glargine w/ Trans Port 100 UNIT/ML SOPN Inject into the skin.     Insulin Glargine w/ Trans Port 100 UNIT/ML SOPN Inject into the skin.     lamoTRIgine (LAMICTAL) 200 MG tablet Take 1 tablet (200 mg total) by mouth daily. 90 tablet 0   meclizine (ANTIVERT) 25 MG tablet Take by mouth 2 (two) times daily as needed.     metFORMIN (GLUCOPHAGE) 500 MG tablet Take 500 mg by mouth 2 (two) times daily with a meal.     ondansetron (ZOFRAN) 8 MG tablet Take 1 tablet (8 mg total) by mouth every  8 (eight) hours as needed for nausea or vomiting. 40 tablet 0   OZEMPIC, 1 MG/DOSE, 4 MG/3ML SOPN Inject 1 mg into the skin once a week.     pantoprazole (PROTONIX) 40 MG tablet Take 40 mg by mouth daily.     polyethylene glycol powder (GLYCOLAX/MIRALAX) 17 GM/SCOOP powder SMARTSIG:17 Gram(s) By Mouth 1-2 Times Daily     rifaximin (XIFAXAN) 550 MG TABS tablet Take by mouth.     spironolactone (ALDACTONE) 25 MG tablet Take 25 mg by mouth daily.     SURE COMFORT PEN NEEDLES 31G X 8 MM MISC USE AS DIRECTED TWICEEDAILY.T     tizanidine (ZANAFLEX) 2 MG capsule Take 2 mg by mouth 3 (three) times daily as needed for muscle spasms. Takes differently     No current facility-administered medications for this visit.     PHYSICAL EXAMINATION: Vitals:   01/19/23 1320  BP: (!) 136/40  Pulse: 70  Resp: 20  Temp: 97.8 F (36.6 C)  SpO2: 100%    Filed Weights   01/19/23 1320  Weight: 167 lb 1.6 oz (75.8 kg)   Physical Exam Vitals reviewed.  Constitutional:      Appearance: She is not ill-appearing.     Comments: 4 wheel rolling walker. Unaccompanied.   Cardiovascular:     Rate and Rhythm: Normal rate and regular rhythm.  Pulmonary:     Effort: No respiratory distress.  Abdominal:     General: There is no distension.     Tenderness: There is no abdominal tenderness.  Skin:    General: Skin is warm.     Coloration: Skin is not pale.  Neurological:     Mental Status: She is alert and oriented to person, place, and time.  Psychiatric:        Mood and Affect: Mood normal.        Behavior: Behavior normal.   LABORATORY DATA:  I have reviewed the data as listed Lab Results  Component Value Date   WBC 4.5 01/19/2023   HGB 8.5 (L) 01/19/2023   HCT 27.6 (L) 01/19/2023   MCV 79.8 (L) 01/19/2023   PLT 130 (L) 01/19/2023   Recent Labs    12/29/22 1256 01/08/23 1445  NA 134* 137  K 3.8 3.9  CL 100 104  CO2 24 24  GLUCOSE 225* 260*  BUN 22 20  CREATININE 1.21* 1.13*  CALCIUM 9.3 8.7*  GFRNONAA 50* 54*  PROT 6.7  --   ALBUMIN 3.7  --   AST 50*  --   ALT 39  --   ALKPHOS 81  --   BILITOT 0.8  --    Iron/TIBC/Ferritin/ %Sat    Component Value Date/Time   IRON 21 (L) 12/29/2022 1256   IRON 37 12/06/2019 1507   TIBC 503 (H) 12/29/2022 1256   TIBC 428 12/06/2019 1507   FERRITIN 8 (L) 12/29/2022 1256   FERRITIN 45 12/06/2019 1507   IRONPCTSAT 4 (L) 12/29/2022 1256   IRONPCTSAT 9 (LL) 12/06/2019 1507    No results found.  ASSESSMENT & PLAN:   No problem-specific Assessment & Plan notes found for this encounter.  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  FEB 2024- Iron sat 9 Ferritin 45- wnl-PCP; Hb 9-10. She is s/p IV iron x 4 (Jan 2024), Hmg 7.7. 2 additional doses of IV iron and repeat EGD with argon laser. Hmg has improved to 8.5.  She does not  require blood transfusion though I suspect she might improve from additional IV iron. Will discuss with Dr. Rogue Bussing.   # Symptomatic anemia- would plan for transfusion if hemoglobin < 8. Cancel blood transfusion for 3/20. B12, folate were normal. Kappa and lambda free light chains were slightly elevated but ratio was normal. IgA was slightly elevated. No M spike was observed. INR slightly elevated at 1.4.    # Etiology: Cirrhosis/GI bleed FEB 2024- GAVE -non-bleeding-; colo-? Capsule study.  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 Repeat egd 01/12/23. If counts don't continue to improve, she may need additional treatments to help control underlying bleeding. Recheck blood counts in 1 week.    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 Stable.    # Liver cirrhosis, splenomegaly, small volume ascites.  Patient does not drink alcohol.? NAFLD. AFP was 2.5 on 12/29/22. Patient will need screening ultrasounds. Last US Abdomen 06/24/22 with [facility].    # weight loss sec to ozempic- stable. Low concerns for malignancy at this time.   3/29- add lab (H&H, hold tube), see me. Receive venofer as planned that day.  2 weeks- lab (H&H, hold tube), venofer Follow up with Dr Rogue Bussing as scheduled- la  All questions were answered. The patient knows to call the clinic with any problems, questions or concerns.  Verlon Au, NP 01/19/2023

## 2023-01-19 NOTE — Patient Instructions (Signed)

## 2023-01-19 NOTE — Progress Notes (Signed)
Refused 30 minute observation post-venofer. Aware of potential adverse effects and when to seek medical attention. Vitals stable at discharge.

## 2023-01-20 ENCOUNTER — Inpatient Hospital Stay: Payer: Medicare Other

## 2023-01-20 LAB — KAPPA/LAMBDA LIGHT CHAINS
Kappa free light chain: 59.9 mg/L — ABNORMAL HIGH (ref 3.3–19.4)
Kappa, lambda light chain ratio: 1.61 (ref 0.26–1.65)
Lambda free light chains: 37.1 mg/L — ABNORMAL HIGH (ref 5.7–26.3)

## 2023-01-21 ENCOUNTER — Other Ambulatory Visit: Payer: Self-pay | Admitting: Gastroenterology

## 2023-01-21 DIAGNOSIS — K7460 Unspecified cirrhosis of liver: Secondary | ICD-10-CM

## 2023-01-25 LAB — MULTIPLE MYELOMA PANEL, SERUM
Albumin SerPl Elph-Mcnc: 4.2 g/dL (ref 2.9–4.4)
Albumin/Glob SerPl: 1.6 (ref 0.7–1.7)
Alpha 1: 0.2 g/dL (ref 0.0–0.4)
Alpha2 Glob SerPl Elph-Mcnc: 0.7 g/dL (ref 0.4–1.0)
B-Globulin SerPl Elph-Mcnc: 1 g/dL (ref 0.7–1.3)
Gamma Glob SerPl Elph-Mcnc: 0.9 g/dL (ref 0.4–1.8)
Globulin, Total: 2.8 g/dL (ref 2.2–3.9)
IgA: 420 mg/dL — ABNORMAL HIGH (ref 87–352)
IgG (Immunoglobin G), Serum: 783 mg/dL (ref 586–1602)
IgM (Immunoglobulin M), Srm: 71 mg/dL (ref 26–217)
Total Protein ELP: 7 g/dL (ref 6.0–8.5)

## 2023-01-26 ENCOUNTER — Ambulatory Visit: Payer: Medicaid Other

## 2023-01-26 ENCOUNTER — Encounter: Payer: Self-pay | Admitting: Internal Medicine

## 2023-01-27 ENCOUNTER — Encounter: Payer: Self-pay | Admitting: Internal Medicine

## 2023-01-27 ENCOUNTER — Other Ambulatory Visit: Payer: Self-pay | Admitting: Psychiatry

## 2023-01-27 DIAGNOSIS — F3176 Bipolar disorder, in full remission, most recent episode depressed: Secondary | ICD-10-CM

## 2023-01-28 ENCOUNTER — Other Ambulatory Visit: Payer: Self-pay | Admitting: *Deleted

## 2023-01-28 ENCOUNTER — Other Ambulatory Visit: Payer: Self-pay | Admitting: Internal Medicine

## 2023-01-28 DIAGNOSIS — D500 Iron deficiency anemia secondary to blood loss (chronic): Secondary | ICD-10-CM

## 2023-01-28 DIAGNOSIS — R918 Other nonspecific abnormal finding of lung field: Secondary | ICD-10-CM

## 2023-01-29 ENCOUNTER — Inpatient Hospital Stay: Payer: Medicare Other

## 2023-01-29 ENCOUNTER — Encounter: Payer: Self-pay | Admitting: Nurse Practitioner

## 2023-01-29 ENCOUNTER — Inpatient Hospital Stay (HOSPITAL_BASED_OUTPATIENT_CLINIC_OR_DEPARTMENT_OTHER): Payer: Medicare Other | Admitting: Nurse Practitioner

## 2023-01-29 VITALS — BP 124/49 | HR 66 | Temp 97.1°F | Resp 16 | Ht 60.0 in | Wt 165.0 lb

## 2023-01-29 VITALS — BP 127/50 | HR 66 | Resp 16

## 2023-01-29 DIAGNOSIS — D500 Iron deficiency anemia secondary to blood loss (chronic): Secondary | ICD-10-CM

## 2023-01-29 DIAGNOSIS — D649 Anemia, unspecified: Secondary | ICD-10-CM

## 2023-01-29 LAB — HEMOGLOBIN AND HEMATOCRIT (CANCER CENTER ONLY)
HCT: 31.8 % — ABNORMAL LOW (ref 36.0–46.0)
Hemoglobin: 9.7 g/dL — ABNORMAL LOW (ref 12.0–15.0)

## 2023-01-29 LAB — SAMPLE TO BLOOD BANK

## 2023-01-29 LAB — APTT: aPTT: 32 seconds (ref 24–36)

## 2023-01-29 MED ORDER — SODIUM CHLORIDE 0.9 % IV SOLN
Freq: Once | INTRAVENOUS | Status: AC
  Filled 2023-01-29: qty 250

## 2023-01-29 MED ORDER — SODIUM CHLORIDE 0.9 % IV SOLN
200.0000 mg | Freq: Once | INTRAVENOUS | Status: AC
  Administered 2023-01-29: 200 mg via INTRAVENOUS
  Filled 2023-01-29: qty 200

## 2023-01-29 NOTE — Progress Notes (Signed)
[name] NOTE  Patient Care Team: Rusty Aus, MD as PCP - General (Internal Medicine) Earlie Server, MD as Consulting Physician (Oncology)  CHIEF COMPLAINTS/PURPOSE OF CONSULTATION: Anemia/thrombocytopenia   HEMATOLOGY HISTORY 06/21/2019-06/24/2019 due to severe anemia; hemoglobin of 5.7 upon admission; patient received 2 units of blood transfusion, 3 doses of IV iron. EGD and colonoscopy were performed during the hospitalization EGD and colonoscopy on 06/23/2019. [Dr.Anna; [facility]] EGD showed no abnormalities and the colonoscopy showed 3 sessile diminutive polyps which were resected completely.  Arteries of the duodenum were normal.  3 colon polyps resected were serrated polyp x2 and a tubular adenoma x1. No active source of bleeding was noted.    Patient was scheduled for capsule endoscopy as outpatient but held d/t finding of GAVE.   # Cirrhosis NAFLD- [? [facility]]  EGD with Dr. Haig Prophet 01/12/23  CKD- stage III  HISTORY OF PRESENTING ILLNESS: ambulating with rolling walker [arthritis].   Margaret [name] 67 y.o.  female pleasant patient was been with a history of cirrhosis/nonalcoholic fatty liver is here for follow-up of anemia/thrombocytopenia. She saw Dr Rogue Bussing on 12/29/22. In interim she underwent repeat EGD with Dr. Haig Prophet for [place] on 01/12/23. She has received an additional 2 doses of IV Venofer since tha ttime. Continues to deny black or bloody stools. Energy has improved. Tolerating iron well without significant side effects.    Review of Systems  Constitutional:  Positive for malaise/fatigue. Negative for chills, diaphoresis, fever and weight loss.  HENT:  Negative for nosebleeds and sore throat.   Eyes:  Negative for double vision.  Respiratory:  Positive for shortness of breath. Negative for cough, hemoptysis, sputum production and wheezing.   Cardiovascular:  Negative for chest pain, palpitations, orthopnea and leg swelling.  Gastrointestinal:  Negative  for abdominal pain, blood in stool, constipation, diarrhea, heartburn, melena, nausea and vomiting.  Genitourinary:  Negative for dysuria, frequency, hematuria and urgency.  Musculoskeletal:  Negative for back pain and joint pain.  Skin:  Negative for itching and rash.  Neurological:  Negative for dizziness, tingling, focal weakness, weakness and headaches.  Endo/Heme/Allergies:  Does not bruise/bleed easily.  Psychiatric/Behavioral:  Negative for depression. The patient is not nervous/anxious and does not have insomnia.    MEDICAL HISTORY:  Past Medical History:  Diagnosis Date   Bipolar 1 disorder ([place])    Depression    Diabetes mellitus without complication (HCC)    Type 2   GERD (gastroesophageal reflux disease)    Hypertension    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H ([place])    Osteoporosis    SURGICAL HISTORY: Past Surgical History:  Procedure Laterality Date   ABDOMINAL HYSTERECTOMY     CHOLECYSTECTOMY     COLONOSCOPY WITH PROPOFOL N/A 06/23/2019   Procedure: COLONOSCOPY WITH PROPOFOL;  Surgeon: Jonathon Bellows, MD;  Location: [facility] ENDOSCOPY;  Service: Gastroenterology;  Laterality: N/A;   COLONOSCOPY WITH PROPOFOL N/A 12/08/2022   Procedure: COLONOSCOPY WITH PROPOFOL;  Surgeon: Lesly Rubenstein, MD;  Location: [facility] ENDOSCOPY;  Service: Endoscopy;  Laterality: N/A;   ESOPHAGOGASTRODUODENOSCOPY (EGD) WITH PROPOFOL N/A 06/23/2019   Procedure: ESOPHAGOGASTRODUODENOSCOPY (EGD) WITH PROPOFOL;  Surgeon: Jonathon Bellows, MD;  Location: [facility] ENDOSCOPY;  Service: Gastroenterology;  Laterality: N/A;   ESOPHAGOGASTRODUODENOSCOPY (EGD) WITH PROPOFOL N/A 12/08/2022   Procedure: ESOPHAGOGASTRODUODENOSCOPY (EGD) WITH PROPOFOL;  Surgeon: Lesly Rubenstein, MD;  Location: [facility] ENDOSCOPY;  Service: Endoscopy;  Laterality: N/A;  DM, OZEMPIC   ESOPHAGOGASTRODUODENOSCOPY (EGD) WITH PROPOFOL N/A 01/12/2023   Procedure: ESOPHAGOGASTRODUODENOSCOPY (EGD) WITH PROPOFOL;  Surgeon: Lesly Rubenstein, MD;   Location: [facility] ENDOSCOPY;  Service: Endoscopy;  Laterality: N/A;  DM   JOINT REPLACEMENT     TONSILLECTOMY     SOCIAL HISTORY: Social History   Socioeconomic History   Marital status: Divorced    Spouse name: Not on file   Number of children: 2   Years of education: Not on file   Highest education level: Not on file  Occupational History   Not on file  Tobacco Use   Smoking status: Former    Types: Cigarettes    Quit date: 03/03/2011    Years since quitting: 11.9   Smokeless tobacco: Never  Vaping Use   Vaping Use: Never used  Substance and Sexual Activity   Alcohol use: Not Currently   Drug use: Not Currently   Sexual activity: Not Currently  Other Topics Concern   Not on file  Social History Narrative   Not on file   Social Determinants of Health   Financial Resource Strain: Not on file  Food Insecurity: Not on file  Transportation Needs: Not on file  Physical Activity: Not on file  Stress: Not on file  Social Connections: Not on file  Intimate Partner Violence: Not on file   FAMILY HISTORY: Family History  Problem Relation Age of Onset   Mental illness Father    Bipolar disorder Brother    Bipolar disorder Paternal Uncle    Bipolar disorder Paternal Uncle    ALLERGIES:  is allergic to lithium, penicillins, sulfa antibiotics, and benzonatate.  MEDICATIONS:  Current Outpatient Medications  Medication Sig Dispense Refill   alendronate (FOSAMAX) 35 MG tablet Take 35 mg by mouth every 7 (seven) days. Take with a full glass of water on an empty stomach.     amLODipine (NORVASC) 2.5 MG tablet Take by mouth.     ARIPiprazole (ABILIFY) 10 MG tablet Take 1 tablet (10 mg total) by mouth daily. 90 tablet 0   benztropine (COGENTIN) 0.5 MG tablet Take 1 tablet (0.5 mg total) by mouth daily as needed for tremors. 15 tablet 1   escitalopram (LEXAPRO) 10 MG tablet Take 1 tablet (10 mg total) by mouth daily. 90 tablet 0   furosemide (LASIX) 20 MG tablet Take 10 mg by mouth  daily as needed.     hydrOXYzine (VISTARIL) 25 MG capsule Take 25 mg by mouth daily as needed for anxiety.     Insulin Glargine w/ Trans Port 100 UNIT/ML SOPN Inject into the skin.     Insulin Glargine w/ Trans Port 100 UNIT/ML SOPN Inject into the skin.     lamoTRIgine (LAMICTAL) 200 MG tablet TAKE ONE TABLET BY MOUTH ONE TIME DAILY 90 tablet 0   metFORMIN (GLUCOPHAGE) 500 MG tablet Take 500 mg by mouth 2 (two) times daily with a meal.     ondansetron (ZOFRAN) 8 MG tablet Take 1 tablet (8 mg total) by mouth every 8 (eight) hours as needed for nausea or vomiting. 40 tablet 0   OZEMPIC, 1 MG/DOSE, 4 MG/3ML SOPN Inject 1 mg into the skin once a week.     pantoprazole (PROTONIX) 40 MG tablet Take 40 mg by mouth daily.     polyethylene glycol powder (GLYCOLAX/MIRALAX) 17 GM/SCOOP powder SMARTSIG:17 Gram(s) By Mouth 1-2 Times Daily     rifaximin (XIFAXAN) 550 MG TABS tablet Take by mouth.     spironolactone (ALDACTONE) 25 MG tablet Take 25 mg by mouth daily.     SURE COMFORT PEN NEEDLES 31G  X 8 MM MISC USE AS DIRECTED TWICEEDAILY.T     tizanidine (ZANAFLEX) 2 MG capsule Take 2 mg by mouth 3 (three) times daily as needed for muscle spasms. Takes differently     HYDROcodone-acetaminophen (NORCO/VICODIN) 5-325 MG tablet Take 1 tablet by mouth every 6 (six) hours as needed for severe pain. Must last 30 days. (Patient not taking: Reported on 01/19/2023) 120 tablet 0   [START ON 02/03/2023] HYDROcodone-acetaminophen (NORCO/VICODIN) 5-325 MG tablet Take 1 tablet by mouth every 6 (six) hours as needed for severe pain. Must last 30 days. (Patient not taking: Reported on 01/19/2023) 120 tablet 0   insulin glargine (LANTUS) 100 UNIT/ML Solostar Pen Inject into the skin. (Patient not taking: Reported on 01/12/2023)     meclizine (ANTIVERT) 25 MG tablet Take by mouth 2 (two) times daily as needed. (Patient not taking: Reported on 01/29/2023)     No current facility-administered medications for this visit.     PHYSICAL  EXAMINATION: Vitals:   01/29/23 1309  BP: (!) 124/49  Pulse: 66  Resp: 16  Temp: (!) 97.1 F (36.2 C)  SpO2: 100%   Filed Weights   01/29/23 1309  Weight: 165 lb (74.8 kg)   Physical Exam Vitals reviewed.  Constitutional:      Appearance: She is not ill-appearing.     Comments: 4 wheel rolling walker. Unaccompanied.   Cardiovascular:     Rate and Rhythm: Normal rate and regular rhythm.  Pulmonary:     Effort: No respiratory distress.  Abdominal:     General: There is no distension.     Tenderness: There is no abdominal tenderness.  Skin:    General: Skin is warm.     Coloration: Skin is not pale.  Neurological:     Mental Status: She is alert and oriented to person, place, and time.  Psychiatric:        Mood and Affect: Mood normal.        Behavior: Behavior normal.    LABORATORY DATA:  I have reviewed the data as listed Lab Results  Component Value Date   WBC 4.5 01/19/2023   HGB 9.7 (L) 01/29/2023   HCT 31.8 (L) 01/29/2023   MCV 79.8 (L) 01/19/2023   PLT 130 (L) 01/19/2023   Recent Labs    12/29/22 1256 01/08/23 1445 01/19/23 1248  NA 134* 137 137  K 3.8 3.9 4.0  CL 100 104 103  CO2 24 24 24   GLUCOSE 225* 260* 105*  BUN 22 20 22   CREATININE 1.21* 1.13* 1.23*  CALCIUM 9.3 8.7* 9.3  GFRNONAA 50* 54* 49*  PROT 6.7  --   --   ALBUMIN 3.7  --   --   AST 50*  --   --   ALT 39  --   --   ALKPHOS 81  --   --   BILITOT 0.8  --   --    Iron/TIBC/Ferritin/ %Sat    Component Value Date/Time   IRON 21 (L) 12/29/2022 1256   IRON 37 12/06/2019 1507   TIBC 503 (H) 12/29/2022 1256   TIBC 428 12/06/2019 1507   FERRITIN 8 (L) 12/29/2022 1256   FERRITIN 45 12/06/2019 1507   IRONPCTSAT 4 (L) 12/29/2022 1256   IRONPCTSAT 9 (LL) 12/06/2019 1507    No results found.  ASSESSMENT & PLAN:   No problem-specific Assessment & Plan notes found for this encounter.  # [2020] History of iron deficiency anemia secondary to chronic GI bleed/cirrhosis; GAVE [EGD  in  G+FEB 2024].  FEB 2024- Iron sat 9 Ferritin 45- wnl-PCP; Hb 9-10. She is s/p IV iron x 4 (Jan 2024), Hmg 7.7. 2 additional doses of IV iron and repeat EGD with argon laser. Hmg has improved to 8.5. She received additional iron on 01/19/23. Hemoglobin continues to improve today, hmg 9.7. Plan for IV iron today. Repeat in 2 weeks.    # Etiology: Cirrhosis/GI bleed FEB 2024- GAVE -non-bleeding-; colo-? Capsule study.  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 Repeat egd 01/12/23. If counts don't continue to improve, she may need additional treatments to help control underlying bleeding.    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 Stable.    # Liver cirrhosis, splenomegaly, small volume ascites.  Patient does not drink alcohol.? NAFLD. AFP was 2.5 on 12/29/22. Patient will need screening ultrasounds. Last US Abdomen 06/24/22 with [facility].    # weight loss sec to ozempic- stable. Low concerns for malignancy at this time.   3/29- venofer today 4/3/- H&H, hold tube & venofer Follow up with Dr Donneta Romberg as scheduled on 5/3 with possible venofer- la  All questions were answered. The patient knows to call the clinic with any problems, questions or concerns.  Alinda Dooms, NP 01/29/2023

## 2023-01-29 NOTE — Patient Instructions (Signed)

## 2023-02-01 ENCOUNTER — Encounter: Payer: Self-pay | Admitting: Internal Medicine

## 2023-02-02 ENCOUNTER — Other Ambulatory Visit: Payer: Self-pay | Admitting: *Deleted

## 2023-02-02 DIAGNOSIS — D649 Anemia, unspecified: Secondary | ICD-10-CM

## 2023-02-03 ENCOUNTER — Ambulatory Visit
Admission: RE | Admit: 2023-02-03 | Discharge: 2023-02-03 | Disposition: A | Discharge status: Home / Self Care | Payer: Medicare Other | Source: Ambulatory Visit | Attending: Internal Medicine | Admitting: Internal Medicine

## 2023-02-03 ENCOUNTER — Inpatient Hospital Stay: Payer: Medicare Other

## 2023-02-03 ENCOUNTER — Inpatient Hospital Stay: Payer: Medicare Other | Attending: Internal Medicine

## 2023-02-03 VITALS — BP 139/57 | HR 63 | Temp 97.7°F | Resp 18

## 2023-02-03 DIAGNOSIS — K922 Gastrointestinal hemorrhage, unspecified: Secondary | ICD-10-CM | POA: Insufficient documentation

## 2023-02-03 DIAGNOSIS — D649 Anemia, unspecified: Secondary | ICD-10-CM

## 2023-02-03 DIAGNOSIS — D500 Iron deficiency anemia secondary to blood loss (chronic): Secondary | ICD-10-CM | POA: Diagnosis not present

## 2023-02-03 DIAGNOSIS — R918 Other nonspecific abnormal finding of lung field: Secondary | ICD-10-CM | POA: Diagnosis present

## 2023-02-03 LAB — HEMOGLOBIN AND HEMATOCRIT (CANCER CENTER ONLY)
HCT: 31.4 % — ABNORMAL LOW (ref 36.0–46.0)
Hemoglobin: 9.6 g/dL — ABNORMAL LOW (ref 12.0–15.0)

## 2023-02-03 LAB — SAMPLE TO BLOOD BANK

## 2023-02-03 MED ORDER — SODIUM CHLORIDE 0.9 % IV SOLN
Freq: Once | INTRAVENOUS | Status: AC
  Filled 2023-02-03: qty 250

## 2023-02-03 MED ORDER — IOHEXOL 300 MG/ML  SOLN
100.0000 mL | Freq: Once | INTRAMUSCULAR | Status: AC | PRN
  Administered 2023-02-03: 100 mL via INTRAVENOUS

## 2023-02-03 MED ORDER — SODIUM CHLORIDE 0.9 % IV SOLN
200.0000 mg | Freq: Once | INTRAVENOUS | Status: AC
  Administered 2023-02-03: 200 mg via INTRAVENOUS
  Filled 2023-02-03: qty 200

## 2023-02-08 DIAGNOSIS — R918 Other nonspecific abnormal finding of lung field: Secondary | ICD-10-CM | POA: Insufficient documentation

## 2023-02-09 ENCOUNTER — Ambulatory Visit
Admission: RE | Admit: 2023-02-09 | Discharge: 2023-02-09 | Disposition: A | Discharge status: Home / Self Care | Payer: Medicare Other | Source: Ambulatory Visit | Attending: Gastroenterology | Admitting: Gastroenterology

## 2023-02-09 DIAGNOSIS — K7460 Unspecified cirrhosis of liver: Secondary | ICD-10-CM

## 2023-02-18 ENCOUNTER — Ambulatory Visit
Payer: Medicare Other | Attending: Student in an Organized Health Care Education/Training Program | Admitting: Student in an Organized Health Care Education/Training Program

## 2023-02-18 ENCOUNTER — Encounter: Payer: Self-pay | Admitting: Student in an Organized Health Care Education/Training Program

## 2023-02-18 VITALS — BP 133/48 | HR 70 | Temp 97.9°F | Resp 20 | Ht 60.0 in | Wt 160.0 lb

## 2023-02-18 DIAGNOSIS — M5416 Radiculopathy, lumbar region: Secondary | ICD-10-CM | POA: Diagnosis present

## 2023-02-18 DIAGNOSIS — Z79891 Long term (current) use of opiate analgesic: Secondary | ICD-10-CM

## 2023-02-18 DIAGNOSIS — M1712 Unilateral primary osteoarthritis, left knee: Secondary | ICD-10-CM

## 2023-02-18 DIAGNOSIS — G8929 Other chronic pain: Secondary | ICD-10-CM | POA: Diagnosis present

## 2023-02-18 DIAGNOSIS — M25562 Pain in left knee: Secondary | ICD-10-CM | POA: Diagnosis present

## 2023-02-18 DIAGNOSIS — M47816 Spondylosis without myelopathy or radiculopathy, lumbar region: Secondary | ICD-10-CM | POA: Diagnosis present

## 2023-02-18 DIAGNOSIS — Z96642 Presence of left artificial hip joint: Secondary | ICD-10-CM | POA: Diagnosis present

## 2023-02-18 DIAGNOSIS — G894 Chronic pain syndrome: Secondary | ICD-10-CM | POA: Diagnosis present

## 2023-02-18 MED ORDER — HYDROCODONE-ACETAMINOPHEN 10-325 MG PO TABS: 1.0000 | ORAL_TABLET | Freq: Three times a day (TID) | ORAL | 0 refills | Status: AC | PRN

## 2023-02-18 MED ORDER — HYDROCODONE-ACETAMINOPHEN 10-325 MG PO TABS: 1.0000 | ORAL_TABLET | Freq: Three times a day (TID) | ORAL | 0 refills | Status: DC | PRN

## 2023-02-18 NOTE — Progress Notes (Signed)
Safety precautions to be maintained throughout the outpatient stay will include: orient to surroundings, keep bed in low position, maintain call bell within reach at all times, provide assistance with transfer out of bed and ambulation.   Nursing Pain Medication Assessment:  Safety precautions to be maintained throughout the outpatient stay will include: orient to surroundings, keep bed in low position, maintain call bell within reach at all times, provide assistance with transfer out of bed and ambulation.  Medication Inspection Compliance: Pill count conducted under aseptic conditions, in front of the patient. Neither the pills nor the bottle was removed from the patient's sight at any time. Once count was completed pills were immediately returned to the patient in their original bottle.  Medication: Hydrocodone/APAP Pill/Patch Count:  51 of 120 pills remain Pill/Patch Appearance: Markings consistent with prescribed medication Bottle Appearance: Standard pharmacy container. Clearly labeled. Filled Date: 04 / 23 / 2024 Last Medication intake:  Today

## 2023-02-18 NOTE — Progress Notes (Signed)
PROVIDER NOTE: Information contained herein reflects review and annotations entered in association with encounter. Interpretation of such information and data should be left to medically-trained personnel. Information provided to patient can be located elsewhere in the medical record under "Patient Instructions". Document created using STT-dictation technology, any transcriptional errors that may result from process are unintentional.    Patient: Margaret [name]  Service Category: E/M  Provider: Edward Jolly, MD  DOB: [birth year]  DOS: 02/18/2023  Specialty: Interventional Pain Management  MRN: [id number]  Setting: Ambulatory outpatient  PCP: Danella Penton, MD  Type: Established Patient    Referring Provider: Danella Penton, MD  Location: Office  Delivery: Face-to-face     HPI  Margaret [name], a 67 y.o. year old female, is here today because of her Primary osteoarthritis of left knee [M17.12]. Margaret [name] primary complain today is Hip Pain (Left) Last encounter: My last encounter with her was on 11/12/22 Pertinent problems: Margaret [name] has Chronic pain syndrome; Encounter for long-term opiate analgesic use; Bilateral primary osteoarthritis of knee; SI (sacroiliac) joint dysfunction; History of left hip replacement; Recurrent major depressive episodes, moderate; Cervical radicular pain; Pain management contract signed; Lumbar facet arthropathy; Pain in joint of right shoulder; and Cervical facet joint syndrome on their pertinent problem list. Pain Assessment: Severity of Chronic pain is reported as a 7 /10. Location: Hip Left/Denies. Onset: More than a month ago. Quality: Constant, Aching. Timing: Constant. Modifying factor(s): Denies. Vitals:  height is 5' (1.524 m) and weight is 160 lb (72.6 kg). Her temporal temperature is 97.9 F (36.6 C). Her blood pressure is 133/48 (abnormal) and her pulse is 70. Her respiration is 20 and oxygen saturation is 100%.   Reason for encounter: medication management.    No change in medical history since last visit. Patient continues multimodal pain regimen as prescribed.  States that it provides pain relief and improvement in functional status. Continues to endorse persistent left hip pain related to hip surgery with hardware present.  Has tried a left piriformis and left hip bursa injection which was not helpful unfortunately. Increased left knee pain Patient would like to repeat left knee hyalgan injection, good pain relief and functional response after 2 series of Hyalgan knee injections done 08/28/20, 03/19/2021. Would like to return to previous Hydrocodone regimen of 10 mg TID prn (from 5 mg QID prn)  Pharmacotherapy Assessment  Analgesic: Norco 10 mg TID prn   Monitoring: [place] PMP: PDMP reviewed during this encounter.       Pharmacotherapy: No side-effects or adverse reactions reported. Compliance: No problems identified. Effectiveness: Clinically acceptable.  Earlyne Iba, RN  02/18/2023 11:04 AM  Sign when Signing Visit Safety precautions to be maintained throughout the outpatient stay will include: orient to surroundings, keep bed in low position, maintain call bell within reach at all times, provide assistance with transfer out of bed and ambulation.   Nursing Pain Medication Assessment:  Safety precautions to be maintained throughout the outpatient stay will include: orient to surroundings, keep bed in low position, maintain call bell within reach at all times, provide assistance with transfer out of bed and ambulation.  Medication Inspection Compliance: Pill count conducted under aseptic conditions, in front of the patient. Neither the pills nor the bottle was removed from the patient's sight at any time. Once count was completed pills were immediately returned to the patient in their original bottle.  Medication: Hydrocodone/APAP Pill/Patch Count:  51 of 120 pills remain Pill/Patch Appearance: Markings consistent with prescribed  medication  Bottle Appearance: Standard pharmacy container. Clearly labeled. Filled Date: 04 / 23 / 2024 Last Medication intake:  Today      UDS:  Summary  Date Value Ref Range Status  05/26/2022 Note  Final    Comment:    ==================================================================== [employer] [facility] (MW) ==================================================================== Test                             Result       Flag       Units  Drug Present and Declared for Prescription Verification   Hydrocodone                    4270         EXPECTED   ng/mg creat   Hydromorphone                  287          EXPECTED   ng/mg creat   Dihydrocodeine                 570          EXPECTED   ng/mg creat   Norhydrocodone                 3017         EXPECTED   ng/mg creat    Sources of hydrocodone include scheduled prescription medications.    Hydromorphone, dihydrocodeine and norhydrocodone are expected    metabolites of hydrocodone. Hydromorphone and dihydrocodeine are    also available as scheduled prescription medications.  ==================================================================== Test                      Result    Flag   Units      Ref Range   Creatinine              46               mg/dL      >=16 ==================================================================== Declared Medications:  The flagging and interpretation on this report are based on the  following declared medications.  Unexpected results may arise from  inaccuracies in the declared medications.   **Note: The testing scope of this panel includes these medications:   Hydrocodone (Norco)   **Note: The testing scope of this panel does not include the  following reported medications:   Acetaminophen (Tylenol)  Acetaminophen (Norco)  Alendronate (Fosamax)  Amlodipine (Norvasc)  Aripiprazole (Abilify)  Benztropine (Cogentin)  Escitalopram (Lexapro)  Furosemide (Lasix)  Glimepiride (Amaryl)   Hydroxyzine (Vistaril)  Insulin (Lantus)  Lactulose (Chronulac)  Lamotrigine (Lamictal)  Linaclotide (Linzess)  Meclizine (Antivert)  Metformin (Glucophage)  Pantoprazole (Protonix)  Semaglutide (Ozempic)  Spironolactone (Aldactone)  Tizanidine (Zanaflex)  Vitamin B  Vitamin C ==================================================================== For clinical consultation, please call [phone]. ====================================================================      ROS  Constitutional: Denies any fever or chills Gastrointestinal: No reported hemesis, hematochezia, vomiting, or acute GI distress Musculoskeletal:  Left hip pain Neurological: No reported episodes of acute onset apraxia, aphasia, dysarthria, agnosia, amnesia, paralysis, loss of coordination, or loss of consciousness  Medication Review  ARIPiprazole, HYDROcodone-acetaminophen, Insulin Glargine w/ Trans Port, Insulin Pen Needle, Semaglutide (1 MG/DOSE), alendronate, amLODipine, benztropine, escitalopram, furosemide, hydrOXYzine, insulin glargine, lamoTRIgine, meclizine, metFORMIN, ondansetron, pantoprazole, polyethylene glycol powder, rifaximin, spironolactone, and tizanidine  History Review  Allergy: Margaret [name] is allergic to lithium, penicillins, sulfa antibiotics, and benzonatate. Drug: Ms.  [name]  reports that she does not currently use drugs. Alcohol:  reports that she does not currently use alcohol. Tobacco:  reports that she quit smoking about 11 years ago. Her smoking use included cigarettes. She has never used smokeless tobacco. Social: Margaret [name]  reports that she quit smoking about 11 years ago. Her smoking use included cigarettes. She has never used smokeless tobacco. She reports that she does not currently use alcohol. She reports that she does not currently use drugs. Medical:  has a past medical history of Bipolar 1 disorder, Depression, Diabetes mellitus without complication, GERD  (gastroesophageal reflux disease), Hypertension, 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H, and Osteoporosis. Surgical: Ms. Kalina  has a past surgical history that includes Abdominal hysterectomy; Tonsillectomy; Cholecystectomy; Esophagogastroduodenoscopy (egd) with propofol (N/A, 06/23/2019); Colonoscopy with propofol (N/A, 06/23/2019); Esophagogastroduodenoscopy (egd) with propofol (N/A, 12/08/2022); Colonoscopy with propofol (N/A, 12/08/2022); Joint replacement; and Esophagogastroduodenoscopy (egd) with propofol (N/A, 01/12/2023). Family: family history includes Bipolar disorder in her brother, paternal uncle, and paternal uncle; Mental illness in her father.  Laboratory Chemistry Profile   Renal Lab Results  Component Value Date   BUN 22 01/19/2023   CREATININE 1.23 (H) 01/19/2023   BCR 19 12/06/2019   GFRAA 114 12/06/2019   GFRNONAA 49 (L) 01/19/2023    Hepatic Lab Results  Component Value Date   AST 50 (H) 12/29/2022   ALT 39 12/29/2022   ALBUMIN 3.7 12/29/2022   ALKPHOS 81 12/29/2022    Electrolytes Lab Results  Component Value Date   NA 137 01/19/2023   K 4.0 01/19/2023   CL 103 01/19/2023   CALCIUM 9.3 01/19/2023    Bone No results found for: "VD25OH", "VD125OH2TOT", "YQ6578IO9", "GE9528UX3", "25OHVITD1", "25OHVITD2", "25OHVITD3", "TESTOFREE", "TESTOSTERONE"  Inflammation (CRP: Acute Phase) (ESR: Chronic Phase) No results found for: "CRP", "ESRSEDRATE", "LATICACIDVEN"       Note: Above Lab results reviewed.   Physical Exam  General appearance: Well nourished, well developed, and well hydrated. In no apparent acute distress Mental status: Alert, oriented x 3 (person, place, & time)       Respiratory: No evidence of acute respiratory distress Eyes: PERLA Vitals: BP (!) 133/48   Pulse 70   Temp 97.9 F (36.6 C) (Temporal)   Resp 20   Ht 5' (1.524 m)   Wt 160 lb (72.6 kg)   SpO2 100%   BMI 31.25 kg/m  BMI: Estimated body mass index is 31.25 kg/m as calculated from  the following:   Height as of this encounter: 5' (1.524 m).   Weight as of this encounter: 160 lb (72.6 kg). Ideal: Ideal body weight: 45.5 kg (100 lb 4.9 oz) Adjusted ideal body weight: 56.3 kg (124 lb 3 oz)  Lumbar Spine Area Exam  Skin & Axial Inspection: No masses, redness, or swelling Alignment: Symmetrical Functional ROM: Pain restricted ROM       Stability: No instability detected Muscle Tone/Strength: Functionally intact. No obvious neuro-muscular anomalies detected. Sensory (Neurological): Musculoskeletal pain pattern  Lower Extremity Exam    Side: Right lower extremity  Side: Left lower extremity  Stability: No instability observed          Stability: No instability observed          Skin & Extremity Inspection: Skin color, temperature, and hair growth are WNL. No peripheral edema or cyanosis. No masses, redness, swelling, asymmetry, or associated skin lesions. No contractures.  Skin & Extremity Inspection: left ankle swelling  Functional ROM: Unrestricted ROM  Functional ROM: Pain restricted ROM  left knee          Muscle Tone/Strength: Functionally intact. No obvious neuro-muscular anomalies detected.  Muscle Tone/Strength: Functionally intact. No obvious neuro-muscular anomalies detected.  Sensory (Neurological): Unimpaired        Sensory (Neurological): Arthropathic arthralgia        DTR: Patellar: deferred today Achilles: deferred today Plantar: deferred today  DTR: Patellar: deferred today Achilles: deferred today Plantar: deferred today  Palpation: No palpable anomalies  Palpation: No palpable anomalies    Assessment   Status Diagnosis  Having a Flare-up Having a Flare-up Controlled 1. Primary osteoarthritis of left knee   2. Chronic pain of left knee   3. Encounter for long-term opiate analgesic use   4. History of left hip replacement (2017)   5. Lumbar facet arthropathy   6. Chronic radicular lumbar pain   7. Chronic pain syndrome          Plan of Care    Margaret [name] has a current medication list which includes the following long-term medication(s): aripiprazole, benztropine, escitalopram, furosemide, lamotrigine, pantoprazole, spironolactone, and metformin.  Pharmacotherapy (Medications Ordered): Meds ordered this encounter  Medications   HYDROcodone-acetaminophen (NORCO) 10-325 MG tablet    Sig: Take 1 tablet by mouth every 8 (eight) hours as needed for severe pain. Must last 30 days.    Dispense:  90 tablet    Refill:  0    Chronic Pain: STOP Act (Not applicable) Fill 1 day early if closed on refill date. Avoid benzodiazepines within 8 hours of opioids   HYDROcodone-acetaminophen (NORCO) 10-325 MG tablet    Sig: Take 1 tablet by mouth every 8 (eight) hours as needed for severe pain. Must last 30 days.    Dispense:  90 tablet    Refill:  0    Chronic Pain: STOP Act (Not applicable) Fill 1 day early if closed on refill date. Avoid benzodiazepines within 8 hours of opioids   HYDROcodone-acetaminophen (NORCO) 10-325 MG tablet    Sig: Take 1 tablet by mouth every 8 (eight) hours as needed for severe pain. Must last 30 days.    Dispense:  90 tablet    Refill:  0    Chronic Pain: STOP Act (Not applicable) Fill 1 day early if closed on refill date. Avoid benzodiazepines within 8 hours of opioids   Encourage patient to continue with dieting and weight loss strategies.   Orders Placed This Encounter  Procedures   KNEE INJECTION    Indications: Knee arthralgia (pain) due to osteoarthritis (OA) Imaging: None (CPT-20610) Position: Sitting Equipment/Materials: Block tray  1.5", 25-G (one per side)  Local anesthetic  Hyalgan (one per side)    Standing Status:   Future    Standing Expiration Date:   02/18/2024    Scheduling Instructions:     Procedure: Knee injection Hyalgan (Hyaluronan/Hyaluronic acid)     Treatment No.:       3     Level: Intra-articular     Laterality: left     Sedation: Patient's  choice.    Order Specific Question:   Where will this procedure be performed?    Answer:   [facility]      Follow-up plan:   Return in about 4 weeks (around 03/18/2023) for left knee hyalgan.    Recent Visits No visits were found meeting these conditions. Showing recent visits within past 90 days and meeting all other requirements Today's Visits Date Type Provider Dept  02/18/23 Office Visit Edward Jolly, MD [facility]  Showing today's visits and meeting all other requirements Future Appointments Date Type Provider Dept  03/10/23 Appointment Edward Jolly, MD [facility]  Showing future appointments within next 90 days and meeting all other requirements  I discussed the assessment and treatment plan with the patient. The patient was provided an opportunity to ask questions and all were answered. The patient agreed with the plan and demonstrated an understanding of the instructions.  Patient advised to call back or seek an in-person evaluation if the symptoms or condition worsens.  Duration of encounter: .  Note by: Edward Jolly, MD Date: 02/18/2023; Time: 12:54 PM

## 2023-02-19 ENCOUNTER — Other Ambulatory Visit: Payer: Self-pay | Admitting: Gastroenterology

## 2023-02-19 ENCOUNTER — Other Ambulatory Visit: Payer: Self-pay | Admitting: Psychiatry

## 2023-02-19 DIAGNOSIS — F3176 Bipolar disorder, in full remission, most recent episode depressed: Secondary | ICD-10-CM

## 2023-02-19 DIAGNOSIS — K7460 Unspecified cirrhosis of liver: Secondary | ICD-10-CM

## 2023-02-25 ENCOUNTER — Encounter: Payer: Medicare Other | Admitting: Student in an Organized Health Care Education/Training Program

## 2023-03-02 ENCOUNTER — Other Ambulatory Visit: Payer: Medicaid Other

## 2023-03-02 ENCOUNTER — Ambulatory Visit: Payer: Medicaid Other | Admitting: Internal Medicine

## 2023-03-02 ENCOUNTER — Ambulatory Visit: Payer: Medicaid Other

## 2023-03-03 ENCOUNTER — Encounter: Payer: Self-pay | Admitting: Internal Medicine

## 2023-03-05 ENCOUNTER — Inpatient Hospital Stay: Payer: 59

## 2023-03-05 ENCOUNTER — Inpatient Hospital Stay: Payer: 59 | Attending: Internal Medicine

## 2023-03-05 ENCOUNTER — Inpatient Hospital Stay (HOSPITAL_BASED_OUTPATIENT_CLINIC_OR_DEPARTMENT_OTHER): Payer: 59 | Admitting: Internal Medicine

## 2023-03-05 ENCOUNTER — Encounter: Payer: Self-pay | Admitting: Internal Medicine

## 2023-03-05 VITALS — BP 156/59 | HR 77 | Temp 98.3°F | Ht 60.0 in | Wt 161.5 lb

## 2023-03-05 VITALS — BP 132/54 | HR 79

## 2023-03-05 DIAGNOSIS — Z79899 Other long term (current) drug therapy: Secondary | ICD-10-CM | POA: Diagnosis not present

## 2023-03-05 DIAGNOSIS — D649 Anemia, unspecified: Secondary | ICD-10-CM | POA: Diagnosis not present

## 2023-03-05 DIAGNOSIS — D500 Iron deficiency anemia secondary to blood loss (chronic): Secondary | ICD-10-CM | POA: Diagnosis not present

## 2023-03-05 DIAGNOSIS — D696 Thrombocytopenia, unspecified: Secondary | ICD-10-CM | POA: Diagnosis not present

## 2023-03-05 DIAGNOSIS — K922 Gastrointestinal hemorrhage, unspecified: Secondary | ICD-10-CM | POA: Insufficient documentation

## 2023-03-05 LAB — CMP (CANCER CENTER ONLY)
ALT: 30 U/L (ref 0–44)
AST: 31 U/L (ref 15–41)
Albumin: 4.2 g/dL (ref 3.5–5.0)
Alkaline Phosphatase: 85 U/L (ref 38–126)
Anion gap: 13 (ref 5–15)
BUN: 25 mg/dL — ABNORMAL HIGH (ref 8–23)
CO2: 22 mmol/L (ref 22–32)
Calcium: 9.8 mg/dL (ref 8.9–10.3)
Chloride: 100 mmol/L (ref 98–111)
Creatinine: 1.15 mg/dL — ABNORMAL HIGH (ref 0.44–1.00)
GFR, Estimated: 53 mL/min — ABNORMAL LOW (ref >60)
Glucose, Bld: 204 mg/dL — ABNORMAL HIGH (ref 70–99)
Potassium: 4.4 mmol/L (ref 3.5–5.1)
Sodium: 135 mmol/L (ref 135–145)
Total Bilirubin: 1.1 mg/dL (ref 0.3–1.2)
Total Protein: 7.5 g/dL (ref 6.5–8.1)

## 2023-03-05 LAB — CBC WITH DIFFERENTIAL (CANCER CENTER ONLY)
Abs Immature Granulocytes: 0.04 10*3/uL (ref 0.00–0.07)
Basophils Absolute: 0 10*3/uL (ref 0.0–0.1)
Basophils Relative: 0 %
Eosinophils Absolute: 0 10*3/uL (ref 0.0–0.5)
Eosinophils Relative: 0 %
HCT: 37.5 % (ref 36.0–46.0)
Hemoglobin: 12 g/dL (ref 12.0–15.0)
Immature Granulocytes: 0 %
Lymphocytes Relative: 13 %
Lymphs Abs: 1.2 10*3/uL (ref 0.7–4.0)
MCH: 25.5 pg — ABNORMAL LOW (ref 26.0–34.0)
MCHC: 32 g/dL (ref 30.0–36.0)
MCV: 79.6 fL — ABNORMAL LOW (ref 80.0–100.0)
Monocytes Absolute: 0.7 10*3/uL (ref 0.1–1.0)
Monocytes Relative: 8 %
Neutro Abs: 7.3 10*3/uL (ref 1.7–7.7)
Neutrophils Relative %: 79 %
Platelet Count: 121 10*3/uL — ABNORMAL LOW (ref 150–400)
RBC: 4.71 MIL/uL (ref 3.87–5.11)
RDW: 19.2 % — ABNORMAL HIGH (ref 11.5–15.5)
WBC Count: 9.4 10*3/uL (ref 4.0–10.5)
nRBC: 0 % (ref 0.0–0.2)

## 2023-03-05 MED ORDER — SODIUM CHLORIDE 0.9 % IV SOLN
Freq: Once | INTRAVENOUS | Status: AC
  Filled 2023-03-05: qty 250

## 2023-03-05 MED ORDER — SODIUM CHLORIDE 0.9 % IV SOLN
200.0000 mg | Freq: Once | INTRAVENOUS | Status: AC
  Administered 2023-03-05: 200 mg via INTRAVENOUS
  Filled 2023-03-05: qty 200

## 2023-03-05 NOTE — Assessment & Plan Note (Addendum)
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  FEB 2024- Iron sat 9 Ferritin 45- wnl-PCP; Hb 9-10.     s/p IV iron x4 [JAN 2024]- MM panel-WNL.   # Today Hb 12- proceed with venofer.   # Etiology: Cirrhosis/GI bleed FEB 2024- GAVE -non-bleeding-; colo-? Capsule study.  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  Hold off capsule.  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 Stable.   #Liver cirrhosis, splenomegaly, small volume ascites.  Patient does not drink alcohol.? NAFLD-check AFP at next visit.  Patient will need screening ultrasounds [? [facility]]-US- 2024- March - cirrhosis;awaiting MRI per GI   # weight loss sec to oezmpic [40 pounds]- stable. Low concerns for malignancy at this time.   # DISPOSITION: # venofer today; # follow up in 3 months- ; MD; labs- cbc/cmp; iron studies; ferritin; AFP- -  possible venofer Dr.B

## 2023-03-05 NOTE — Progress Notes (Signed)
[facility] CONSULT NOTE  Patient Care Team: Danella Penton, MD as PCP - General (Internal Medicine) Earna Coder, MD as Consulting Physician (Internal Medicine)  CHIEF COMPLAINTS/PURPOSE OF CONSULTATION: Anemia/thrombocytopenia   HEMATOLOGY HISTORY  06/21/2019-06/24/2019 due to severe anemia; hemoglobin of 5.7 upon admission; patient received 2 units of blood transfusion, 3 doses of IV iron. EGD and colonoscopy were performed during the hospitalization EGD and colonoscopy on 06/23/2019. [Dr.Anna; [facility]] EGD showed no abnormalities and the colonoscopy showed 3 sessile diminutive polyps which were resected completely.  Arteries of the duodenum were normal.  3 colon polyps resected were serrated polyp x2 and a tubular adenoma x1. No active source of bleeding was noted.    Patient was scheduled for capsule endoscopy as outpatient?   # Cirrhosis NAFLD- [? [facility]]  HISTORY OF PRESENTING ILLNESS: ambulating with rolling walker [arthritis].   Margaret [name] 67 y.o.  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  Patient is s/p IV iron infusion.  Patient also has significant improvement of energy levels but denies any blood in stools or black-colored stools.  No nausea or vomiting.  States her blood sugars are better controlled on Ozempic.    Review of Systems  Constitutional:  Positive for malaise/fatigue. Negative for chills, diaphoresis, fever and weight loss.  HENT:  Negative for nosebleeds and sore throat.   Eyes:  Negative for double vision.  Respiratory:  Negative for cough, hemoptysis, sputum production and wheezing.   Cardiovascular:  Negative for chest pain, palpitations, orthopnea and leg swelling.  Gastrointestinal:  Negative for abdominal pain, blood in stool, constipation, diarrhea, heartburn, melena, nausea and  vomiting.  Genitourinary:  Negative for dysuria, frequency and urgency.  Musculoskeletal:  Positive for back pain and joint pain.  Skin: Negative.  Negative for itching and rash.  Neurological:  Negative for dizziness, tingling, focal weakness, weakness and headaches.  Endo/Heme/Allergies:  Does not bruise/bleed easily.  Psychiatric/Behavioral:  Negative for depression. The patient is not nervous/anxious and does not have insomnia.      MEDICAL HISTORY:  Past Medical History:  Diagnosis Date   Bipolar 1 disorder (HCC)    Depression    Diabetes mellitus without complication (HCC)    Type 2   GERD (gastroesophageal reflux disease)    Hypertension    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H (HCC)    Osteoporosis     SURGICAL HISTORY: Past Surgical History:  Procedure Laterality Date   ABDOMINAL HYSTERECTOMY     CHOLECYSTECTOMY     COLONOSCOPY WITH PROPOFOL N/A 06/23/2019   Procedure: COLONOSCOPY WITH PROPOFOL;  Surgeon: Wyline Mood, MD;  Location: [facility] ENDOSCOPY;  Service: Gastroenterology;  Laterality: N/A;   COLONOSCOPY WITH PROPOFOL N/A 12/08/2022   Procedure: COLONOSCOPY WITH PROPOFOL;  Surgeon: Regis Bill, MD;  Location: [facility] ENDOSCOPY;  Service: Endoscopy;  Laterality: N/A;   ESOPHAGOGASTRODUODENOSCOPY (EGD) WITH PROPOFOL N/A 06/23/2019   Procedure: ESOPHAGOGASTRODUODENOSCOPY (EGD) WITH PROPOFOL;  Surgeon: Wyline Mood, MD;  Location: [facility] ENDOSCOPY;  Service: Gastroenterology;  Laterality: N/A;   ESOPHAGOGASTRODUODENOSCOPY (EGD) WITH PROPOFOL N/A 12/08/2022   Procedure: ESOPHAGOGASTRODUODENOSCOPY (EGD) WITH PROPOFOL;  Surgeon: Regis Bill, MD;  Location: [facility] ENDOSCOPY;  Service: Endoscopy;  Laterality: N/A;  DM, OZEMPIC   ESOPHAGOGASTRODUODENOSCOPY (EGD) WITH PROPOFOL N/A 01/12/2023   Procedure: ESOPHAGOGASTRODUODENOSCOPY (EGD) WITH PROPOFOL;  Surgeon: Regis Bill, MD;  Location: [facility] ENDOSCOPY;  Service: Endoscopy;  Laterality: N/A;  DM   JOINT REPLACEMENT      TONSILLECTOMY      SOCIAL HISTORY: Social History   Socioeconomic History   Marital status: Divorced    Spouse name: Not on file   Number of children: 2   Years of education: Not on file   Highest education level: Not on file  Occupational History   Not on file  Tobacco Use   Smoking status: Former    Types: Cigarettes    Quit date: 03/03/2011    Years since quitting: 12.0   Smokeless tobacco: Never  Vaping Use   Vaping Use: Never used  Substance and Sexual Activity   Alcohol use: Not Currently   Drug use: Not Currently   Sexual activity: Not Currently  Other Topics Concern   Not on file  Social History Narrative   Not on file   Social Determinants of Health   Financial Resource Strain: Not on file  Food Insecurity: Not on file  Transportation Needs: Not on file  Physical Activity: Not on file  Stress: Not on file  Social Connections: Not on file  Intimate Partner Violence: Not on file    FAMILY HISTORY: Family History  Problem Relation Age of Onset   Mental illness Father    Bipolar disorder Brother    Bipolar disorder Paternal Uncle    Bipolar disorder Paternal Uncle     ALLERGIES:  is allergic to lithium, penicillins, sulfa antibiotics, and benzonatate.  MEDICATIONS:  Current Outpatient Medications  Medication Sig Dispense Refill   alendronate (FOSAMAX) 35 MG tablet Take 35 mg by mouth every 7 (seven) days. Take with a full glass of water on an empty stomach.     amLODipine (NORVASC) 2.5 MG tablet Take by mouth.     ARIPiprazole (ABILIFY) 10 MG tablet TAKE ONE TABLET BY MOUTH ONE TIME DAILY 90 tablet 0   benztropine (COGENTIN) 0.5 MG tablet Take 1 tablet (0.5 mg total) by mouth daily as needed for tremors. 15 tablet 1   escitalopram (LEXAPRO) 10 MG tablet Take 1 tablet (10 mg total) by mouth daily. 90 tablet 0   furosemide (LASIX) 20 MG tablet Take 10 mg by mouth daily as needed.     HYDROcodone-acetaminophen (NORCO) 10-325 MG tablet Take 1 tablet by  mouth every 8 (eight) hours as needed for severe pain. Must last 30 days. 90 tablet 0   [START ON 04/04/2023] HYDROcodone-acetaminophen (NORCO) 10-325 MG tablet Take 1 tablet by mouth every 8 (eight) hours as needed for severe pain. Must last 30 days. 90 tablet 0   [START ON 05/04/2023] HYDROcodone-acetaminophen (NORCO) 10-325 MG tablet Take 1 tablet by mouth every 8 (eight) hours as needed for severe pain. Must last 30 days. 90 tablet 0   hydrOXYzine (VISTARIL) 25 MG capsule Take 25 mg by mouth daily as needed for anxiety.     insulin glargine (LANTUS) 100 UNIT/ML Solostar Pen Inject into the skin.     Insulin Glargine w/ Trans Port 100 UNIT/ML SOPN Inject into the skin.     Insulin Glargine w/ Trans Port 100 UNIT/ML SOPN Inject into the skin.     lamoTRIgine (LAMICTAL) 200 MG tablet TAKE ONE TABLET BY MOUTH ONE TIME DAILY 90 tablet 0   meclizine (ANTIVERT) 25 MG tablet Take by mouth 2 (two) times daily as needed.     metFORMIN (GLUCOPHAGE) 500 MG tablet Take 500 mg by mouth 2 (two) times daily with a meal.     ondansetron (ZOFRAN) 8 MG  tablet Take 1 tablet (8 mg total) by mouth every 8 (eight) hours as needed for nausea or vomiting. 40 tablet 0   OZEMPIC, 1 MG/DOSE, 4 MG/3ML SOPN Inject 1 mg into the skin once a week.     pantoprazole (PROTONIX) 40 MG tablet Take 40 mg by mouth daily.     polyethylene glycol powder (GLYCOLAX/MIRALAX) 17 GM/SCOOP powder SMARTSIG:17 Gram(s) By Mouth 1-2 Times Daily     rifaximin (XIFAXAN) 550 MG TABS tablet Take by mouth.     spironolactone (ALDACTONE) 25 MG tablet Take 25 mg by mouth daily.     SURE COMFORT PEN NEEDLES 31G X 8 MM MISC USE AS DIRECTED TWICEEDAILY.T     tizanidine (ZANAFLEX) 2 MG capsule Take 2 mg by mouth 3 (three) times daily as needed for muscle spasms. Takes differently     No current facility-administered medications for this visit.     PHYSICAL EXAMINATION:   Vitals:   03/05/23 1330  BP: (!) 156/59  Pulse: 77  Temp: 98.3 F (36.8 C)   SpO2: 98%    Filed Weights   03/05/23 1330  Weight: 161 lb 8 oz (73.3 kg)     Physical Exam Vitals and nursing note reviewed.  HENT:     Head: Normocephalic and atraumatic.     Mouth/Throat:     Pharynx: Oropharynx is clear.  Eyes:     Extraocular Movements: Extraocular movements intact.     Pupils: Pupils are equal, round, and reactive to light.  Cardiovascular:     Rate and Rhythm: Normal rate and regular rhythm.  Pulmonary:     Comments: Decreased breath sounds bilaterally.  Abdominal:     Palpations: Abdomen is soft.  Musculoskeletal:        General: Normal range of motion.     Cervical back: Normal range of motion.  Skin:    General: Skin is warm.  Neurological:     General: No focal deficit present.     Mental Status: She is alert and oriented to person, place, and time.  Psychiatric:        Behavior: Behavior normal.        Judgment: Judgment normal.      LABORATORY DATA:  I have reviewed the data as listed Lab Results  Component Value Date   WBC 9.4 03/05/2023   HGB 12.0 03/05/2023   HCT 37.5 03/05/2023   MCV 79.6 (L) 03/05/2023   PLT 121 (L) 03/05/2023   Recent Labs    12/29/22 1256 01/08/23 1445 01/19/23 1248 03/05/23 1321  NA 134* 137 137 135  K 3.8 3.9 4.0 4.4  CL 100 104 103 100  CO2 24 24 24 22   GLUCOSE 225* 260* 105* 204*  BUN 22 20 22  25*  CREATININE 1.21* 1.13* 1.23* 1.15*  CALCIUM 9.3 8.7* 9.3 9.8  GFRNONAA 50* 54* 49* 53*  PROT 6.7  --   --  7.5  ALBUMIN 3.7  --   --  4.2  AST 50*  --   --  31  ALT 39  --   --  30  ALKPHOS 81  --   --  85  BILITOT 0.8  --   --  1.1     US Abdomen Limited RUQ (LIVER/GB)  Result Date: 02/09/2023 CLINICAL DATA:  Cirrhosis, cholecystectomy EXAM: ULTRASOUND ABDOMEN LIMITED RIGHT UPPER QUADRANT COMPARISON:  06/24/2022 FINDINGS: Gallbladder: Status post cholecystectomy. No sonographic Murphy sign noted by sonographer. Common bile duct: Diameter: 0.5 cm Liver: Coarse,  nodular cirrhotic morphology  of the liver. No focal lesion identified. Heterogeneously increased parenchymal echogenicity. Portal vein is patent on color Doppler imaging with normal direction of blood flow towards the liver. Other: Prominent porta hepatis lymph nodes measuring up to 2.8 x 1.2 cm. IMPRESSION: 1. Coarse, nodular cirrhotic morphology of the liver. No focal lesion identified. 2. Prominent porta hepatis lymph nodes measuring up to 2.8 x 1.2 cm, nonspecific, possibly reactive. Electronically Signed   By: Jearld Lesch M.D.   On: 02/09/2023 09:40    ASSESSMENT & PLAN:   Symptomatic anemia # [2020] History of iron deficiency anemia secondary to chronic GI bleed/cirrhosis; GAVE [EGD in G+FEB 2024].  FEB 2024- Iron sat 9 Ferritin 45- wnl-PCP; Hb 9-10.     s/p IV iron x4 [JAN 2024]- MM panel-WNL.   # Today Hb 12- proceed with venofer.   # Etiology: Cirrhosis/GI bleed FEB 2024- GAVE -non-bleeding-; colo-? Capsule study.  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  Hold off capsule.  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 Stable.   #Liver cirrhosis, splenomegaly, small volume ascites.  Patient does not drink alcohol.? NAFLD-check AFP at next visit.  Patient will need screening ultrasounds [? [facility]]-US- 2024- March - cirrhosis;awaiting MRI per GI   # weight loss sec to oezmpic [40 pounds]- stable. Low concerns for malignancy at this time.   # DISPOSITION: # venofer today; # follow up in 3 months- ; MD; labs- cbc/cmp; iron studies; ferritin; AFP- -  possible venofer Dr.B  All questions were answered. The patient knows to call the clinic with any problems, questions or concerns.   Earna Coder, MD 03/05/2023 2:25 PM            Did find the reason for

## 2023-03-05 NOTE — Progress Notes (Signed)
Chest and abdomen scans since last visit, [facility].  Fatigue/weakness: no Dyspena: no Light headedness: no Blood in stool: no  C/o with iron levels.

## 2023-03-05 NOTE — Patient Instructions (Signed)

## 2023-03-10 ENCOUNTER — Encounter: Payer: Self-pay | Admitting: Student in an Organized Health Care Education/Training Program

## 2023-03-10 ENCOUNTER — Ambulatory Visit
Payer: 59 | Attending: Student in an Organized Health Care Education/Training Program | Admitting: Student in an Organized Health Care Education/Training Program

## 2023-03-10 VITALS — BP 130/64 | HR 80 | Temp 97.2°F | Resp 18 | Ht 60.0 in | Wt 159.0 lb

## 2023-03-10 DIAGNOSIS — G8929 Other chronic pain: Secondary | ICD-10-CM | POA: Insufficient documentation

## 2023-03-10 DIAGNOSIS — M1712 Unilateral primary osteoarthritis, left knee: Secondary | ICD-10-CM | POA: Diagnosis not present

## 2023-03-10 DIAGNOSIS — M25562 Pain in left knee: Secondary | ICD-10-CM | POA: Diagnosis present

## 2023-03-10 MED ORDER — LIDOCAINE HCL (PF) 2 % IJ SOLN
INTRAMUSCULAR | Status: AC
  Filled 2023-03-10: qty 10

## 2023-03-10 MED ORDER — LIDOCAINE HCL 2 % IJ SOLN
20.0000 mL | Freq: Once | INTRAMUSCULAR | Status: AC
  Administered 2023-03-10: 100 mg

## 2023-03-10 MED ORDER — SODIUM HYALURONATE (VISCOSUP) 20 MG/2ML IX SOSY
2.0000 mL | PREFILLED_SYRINGE | Freq: Once | INTRA_ARTICULAR | Status: AC
  Administered 2023-03-10: 20 mg via INTRA_ARTICULAR

## 2023-03-10 NOTE — Progress Notes (Signed)
Safety precautions to be maintained throughout the outpatient stay will include: orient to surroundings, keep bed in low position, maintain call bell within reach at all times, provide assistance with transfer out of bed and ambulation.  

## 2023-03-10 NOTE — Progress Notes (Signed)
PROVIDER NOTE: Information contained herein reflects review and annotations entered in association with encounter. Interpretation of such information and data should be left to medically-trained personnel. Information provided to patient can be located elsewhere in the medical record under "Patient Instructions". Document created using STT-dictation technology, any transcriptional errors that may result from process are unintentional.    Patient: Margaret [name]  Service Category: Procedure  Provider: Edward Jolly, MD  DOB: 1956-04-14  DOS: 03/10/2023  Location: [facility]  MRN: [id number]  Setting: Ambulatory - outpatient  Referring Provider: Danella Penton, MD  Type: Established Patient  Specialty: Interventional Pain Management  PCP: Danella Penton, MD   Primary Reason for Visit: Interventional Pain Management Treatment. CC: Knee Pain (left)  Procedure:          Anesthesia, Analgesia, Anxiolysis:  Type: Therapeutic Intra-Articular Hyalgan Knee Injection #3  Region: Medial infrapatellar Knee Region Level: Knee Joint Laterality: Left knee  Type: Local Anesthesia Indication(s): Analgesia         Local Anesthetic: Lidocaine 1-2% Route: Infiltration ([place]/IM) IV Access: Declined Sedation: Declined   Position: Sitting   Indications: 1. Primary osteoarthritis of left knee   2. Chronic pain of left knee    Pain Score: Pre-procedure: 6 /10 Post-procedure: 0-No pain/10   Pre-op Assessment:  Margaret [name] is a 67 y.o. (year old), female patient, seen today for interventional treatment. She  has a past surgical history that includes Abdominal hysterectomy; Tonsillectomy; Cholecystectomy; Esophagogastroduodenoscopy (egd) with propofol (N/A, 06/23/2019); Colonoscopy with propofol (N/A, 06/23/2019); Esophagogastroduodenoscopy (egd) with propofol (N/A, 12/08/2022); Colonoscopy with propofol (N/A, 12/08/2022); Joint replacement; and Esophagogastroduodenoscopy (egd) with propofol (N/A,  01/12/2023). Margaret [name] has a current medication list which includes the following prescription(s): alendronate, amlodipine, aripiprazole, benztropine, escitalopram, furosemide, hydrocodone-acetaminophen, [START ON 04/04/2023] hydrocodone-acetaminophen, [START ON 05/04/2023] hydrocodone-acetaminophen, hydroxyzine, lamotrigine, meclizine, metformin, ondansetron, ozempic (1 mg/dose), pantoprazole, polyethylene glycol powder, rifaximin, spironolactone, tizanidine, insulin glargine, insulin glargine w/ trans port, insulin glargine w/ trans port, and sure comfort pen needles. Her primarily concern today is the Knee Pain (left)  Initial Vital Signs:  Pulse/HCG Rate: 77  Temp: 98.1 F (36.7 C) Resp: 18 BP: (!) 131/57 SpO2: 98 %  BMI: Estimated body mass index is 31.05 kg/m as calculated from the following:   Height as of this encounter: 5' (1.524 m).   Weight as of this encounter: 159 lb (72.1 kg).  Risk Assessment: Allergies: Reviewed. She is allergic to lithium, penicillins, sulfa antibiotics, and benzonatate.  Allergy Precautions: None required Coagulopathies: Reviewed. None identified.  Blood-thinner therapy: None at this time Active Infection(s): Reviewed. None identified. Margaret [name] is afebrile  Site Confirmation: Margaret [name] was asked to confirm the procedure and laterality before marking the site Procedure checklist: Completed Consent: Before the procedure and under the influence of no sedative(s), amnesic(s), or anxiolytics, the patient was informed of the treatment options, risks and possible complications. To fulfill our ethical and legal obligations, as recommended by the American Medical Association's Code of Ethics, I have informed the patient of my clinical impression; the nature and purpose of the treatment or procedure; the risks, benefits, and possible complications of the intervention; the alternatives, including doing nothing; the risk(s) and benefit(s) of the alternative  treatment(s) or procedure(s); and the risk(s) and benefit(s) of doing nothing. The patient was provided information about the general risks and possible complications associated with the procedure. These may include, but are not limited to: failure to achieve desired goals, infection, bleeding, organ or nerve damage, allergic reactions,  paralysis, and death. In addition, the patient was informed of those risks and complications associated to the procedure, such as failure to decrease pain; infection; bleeding; organ or nerve damage with subsequent damage to sensory, motor, and/or autonomic systems, resulting in permanent pain, numbness, and/or weakness of one or several areas of the body; allergic reactions; (i.e.: anaphylactic reaction); and/or death. Furthermore, the patient was informed of those risks and complications associated with the medications. These include, but are not limited to: allergic reactions (i.e.: anaphylactic or anaphylactoid reaction(s)); adrenal axis suppression; blood sugar elevation that in diabetics may result in ketoacidosis or comma; water retention that in patients with history of congestive heart failure may result in shortness of breath, pulmonary edema, and decompensation with resultant heart failure; weight gain; swelling or edema; medication-induced neural toxicity; particulate matter embolism and blood vessel occlusion with resultant organ, and/or nervous system infarction; and/or aseptic necrosis of one or more joints. Finally, the patient was informed that Medicine is not an exact science; therefore, there is also the possibility of unforeseen or unpredictable risks and/or possible complications that may result in a catastrophic outcome. The patient indicated having understood very clearly. We have given the patient no guarantees and we have made no promises. Enough time was given to the patient to ask questions, all of which were answered to the patient's satisfaction. Ms.  [name] has indicated that she wanted to continue with the procedure. Attestation: I, the ordering provider, attest that I have discussed with the patient the benefits, risks, side-effects, alternatives, likelihood of achieving goals, and potential problems during recovery for the procedure that I have provided informed consent. Date  Time: 03/10/2023 10:56 AM  Pre-Procedure Preparation:  Monitoring: As per clinic protocol. Respiration, ETCO2, SpO2, BP, heart rate and rhythm monitor placed and checked for adequate function Safety Precautions: Patient was assessed for positional comfort and pressure points before starting the procedure. Time-out: I initiated and conducted the "Time-out" before starting the procedure, as per protocol. The patient was asked to participate by confirming the accuracy of the "Time Out" information. Verification of the correct person, site, and procedure were performed and confirmed by me, the nursing staff, and the patient. "Time-out" conducted as per Joint Commission's Universal Protocol (UP.01.01.01). Time: 1121  Description of Procedure:          Target Area: Knee Joint Approach: Just above the Medial tibial plateau, lateral to the infrapatellar tendon. Area Prepped: Entire knee area, from the mid-thigh to the mid-shin. DuraPrep (Iodine Povacrylex [0.7% available iodine] and Isopropyl Alcohol, 74% w/w) Safety Precautions: Aspiration looking for blood return was conducted prior to all injections. At no point did we inject any substances, as a needle was being advanced. No attempts were made at seeking any paresthesias. Safe injection practices and needle disposal techniques used. Medications properly checked for expiration dates. SDV (single dose vial) medications used. Description of the Procedure: Protocol guidelines were followed. The patient was placed in position over the fluoroscopy table. The target area was identified and the area prepped in the usual manner. Skin  & deeper tissues infiltrated with local anesthetic. Appropriate amount of time allowed to pass for local anesthetics to take effect. The procedure needles were then advanced to the target area. Proper needle placement secured. Negative aspiration confirmed. Solution injected in intermittent fashion, asking for systemic symptoms every 0.5cc of injectate. The needles were then removed and the area cleansed, making sure to leave some of the prepping solution back to take advantage of its long term  bactericidal properties. Vitals:   03/10/23 1101 03/10/23 1125  BP: (!) 131/57 130/64  Pulse: 77 80  Resp: 18   Temp: 98.1 F (36.7 C) (!) 97.2 F (36.2 C)  TempSrc: Temporal Temporal  SpO2: 98% 99%  Weight: 159 lb (72.1 kg)   Height: 5' (1.524 m)     Start Time: 1121 hrs. End Time: 1125 hrs. Materials:  Needle(s) Type: Regular needle Gauge: 25G Length: 1.5-in Medication(s): Please see orders for medications and dosing details.  Post-operative Assessment:  Post-procedure Vital Signs:  Pulse/HCG Rate: 80  Temp: (!) 97.2 F (36.2 C) Resp: 18 BP: 130/64 SpO2: 99 %  EBL: None  Complications: No immediate post-treatment complications observed by team, or reported by patient.  Note: The patient tolerated the entire procedure well. A repeat set of vitals were taken after the procedure and the patient was kept under observation following institutional policy, for this type of procedure. Post-procedural neurological assessment was performed, showing return to baseline, prior to discharge. The patient was provided with post-procedure discharge instructions, including a section on how to identify potential problems. Should any problems arise concerning this procedure, the patient was given instructions to immediately contact us, at any time, without hesitation. In any case, we plan to contact the patient by telephone for a follow-up status report regarding this interventional procedure.  Comments:   No additional relevant information.  Plan of Care  Chronic Opioid Analgesic:  Norco 10 mg TID prn   Medications ordered for procedure: Meds ordered this encounter  Medications   lidocaine (XYLOCAINE) 2 % (with pres) injection 400 mg   Sodium Hyaluronate (Viscosup) SOSY 20 mg    Do not substitute. Deliver to facility day before procedure.   Medications administered: We administered lidocaine and Sodium Hyaluronate (Viscosup).  See the medical record for exact dosing, route, and time of administration.  Follow-up plan:   Return for Keep sch. appt.       Interventional management options: Planned, scheduled, and/or pending:    Intra-articular knee Hyalgan series #1 done 08/28/20   Considering:   Lumbar epidural steroid injection, lumbar facet medial branch nerve block pending lumbar MRI   PRN Procedures:   None at this time     Recent Visits Date Type Provider Dept  02/18/23 Office Visit Edward Jolly, MD [facility]  Showing recent visits within past 90 days and meeting all other requirements Today's Visits Date Type Provider Dept  03/10/23 Procedure visit Edward Jolly, MD [facility]  Showing today's visits and meeting all other requirements Future Appointments Date Type Provider Dept  06/01/23 Appointment Edward Jolly, MD [facility]  Showing future appointments within next 90 days and meeting all other requirements  Disposition: Discharge home  Discharge (Date  Time): 03/10/2023; 1127 hrs.   Primary Care Physician: Danella Penton, MD Location: [facility] Note by: Edward Jolly, MD Date: 03/10/2023; Time: 11:55 AM  Disclaimer:  Medicine is not an exact science. The only guarantee in medicine is that nothing is guaranteed. It is important to note that the decision to proceed with this intervention was based on the information collected from the patient. The Data and conclusions were drawn from the patient's  questionnaire, the interview, and the physical examination. Because the information was provided in large part by the patient, it cannot be guaranteed that it has not been purposely or unconsciously manipulated. Every effort has been made to obtain as much relevant data as possible for this evaluation.  It is important to note that the conclusions that lead to this procedure are derived in large part from the available data. Always take into account that the treatment will also be dependent on availability of resources and existing treatment guidelines, considered by other Pain Management Practitioners as being common knowledge and practice, at the time of the intervention. For Medico-Legal purposes, it is also important to point out that variation in procedural techniques and pharmacological choices are the acceptable norm. The indications, contraindications, technique, and results of the above procedure should only be interpreted and judged by a Board-Certified Interventional Pain Specialist with extensive familiarity and expertise in the same exact procedure and technique.

## 2023-03-11 ENCOUNTER — Encounter: Payer: Self-pay | Admitting: Internal Medicine

## 2023-03-11 ENCOUNTER — Telehealth: Payer: Self-pay

## 2023-03-11 NOTE — Telephone Encounter (Signed)
Post procedure follow up.  Patient states she is doing well.   ?

## 2023-03-16 ENCOUNTER — Encounter: Payer: Self-pay | Admitting: Internal Medicine

## 2023-03-16 ENCOUNTER — Encounter: Payer: Self-pay | Admitting: Psychiatry

## 2023-03-16 ENCOUNTER — Telehealth (INDEPENDENT_AMBULATORY_CARE_PROVIDER_SITE_OTHER): Payer: 59 | Admitting: Psychiatry

## 2023-03-16 DIAGNOSIS — F3176 Bipolar disorder, in full remission, most recent episode depressed: Secondary | ICD-10-CM

## 2023-03-16 DIAGNOSIS — F418 Other specified anxiety disorders: Secondary | ICD-10-CM

## 2023-03-16 MED ORDER — LAMOTRIGINE 200 MG PO TABS: 200.0000 mg | ORAL_TABLET | Freq: Every day | ORAL | 0 refills | Status: DC

## 2023-03-16 MED ORDER — ESCITALOPRAM OXALATE 10 MG PO TABS: 10.0000 mg | ORAL_TABLET | Freq: Every day | ORAL | 1 refills | Status: DC

## 2023-03-16 NOTE — Progress Notes (Signed)
Virtual Visit via Video Note  I connected with Margaret [name] on 03/16/23 at 11:00 AM EDT by a video enabled telemedicine application and verified that I am speaking with the correct person using two identifiers.  Location Provider Location : [facility] Patient Location : Home  Participants: Patient , Provider   I discussed the limitations of evaluation and management by telemedicine and the availability of in person appointments. The patient expressed understanding and agreed to proceed.   I discussed the assessment and treatment plan with the patient. The patient was provided an opportunity to ask questions and all were answered. The patient agreed with the plan and demonstrated an understanding of the instructions.   The patient was advised to call back or seek an in-person evaluation if the symptoms worsen or if the condition fails to improve as anticipated.   [facility] MD OP Progress Note  03/16/2023 2:08 PM Margaret [name]  MRN:  [id number]  Chief Complaint:  Chief Complaint  Patient presents with   Follow-up   Depression   Medication Refill   HPI: Margaret [name] is a 67 year old Caucasian female who lives in [place], has a history of bipolar disorder, hyperlipidemia, chronic pain was evaluated by telemedicine today.  Patient today reports she is currently doing overall well.  She reports she however had recent medical problems, I have reviewed notes per Dr. Brahmanday-03/05/2023-patient treated for severe anemia with hemoglobin of 5.7 upon admission received 2 units of blood transfusion, 3 doses of IV iron, EGD and colonoscopy were performed.  Colon polyps resected with serrated polyp x 2 and tubular adenoma x 1.  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  Patient with his free of iron-deficiency anemia secondary to chronic GI bleed/cirrhosis status post IV iron.'  Patient today reports since receiving treatment for her iron-deficiency anemia and other  problems she is currently feeling much better.  She does not struggle with fatigue anymore like she used to before.  Patient reports sleep is overall good.  Reports appetite is fair.  Denies any mood swings.  Denies any mania or hypomanic symptoms.  Denies sadness, anhedonia.  Reports anxiety is manageable.  Patient denies any suicidality, homicidality or perceptual disturbances.  Currently compliant on the Abilify, Lexapro and Lamictal.  Does report cramps of the lower extremities on and off at night.  Does have tizanidine as well as Cogentin prescribed for muscle spasms as needed.  Patient agrees to use the tizanidine and let writer know if she does not get better.  Patient denies any other concerns.  Visit Diagnosis:    ICD-10-CM   1. Bipolar disorder, in full remission, most recent episode depressed (HCC)  F31.76 lamoTRIgine (LAMICTAL) 200 MG tablet    2. Other specified anxiety disorders  F41.8 escitalopram (LEXAPRO) 10 MG tablet   With limited symptom attacks.      Past Psychiatric History: I have reviewed past psychiatric history from progress note on 08/21/2019.  Past trials of Depakote, lithium, risperidone, Xanax.  Past Medical History:  Past Medical History:  Diagnosis Date   Bipolar 1 disorder (HCC)    Depression    Diabetes mellitus without complication (HCC)    Type 2   GERD (gastroesophageal reflux disease)    Hypertension    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H [facility])    Osteoporosis     Past Surgical History:  Procedure Laterality Date   ABDOMINAL HYSTERECTOMY     CHOLECYSTECTOMY     COLONOSCOPY WITH PROPOFOL N/A 06/23/2019  Procedure: COLONOSCOPY WITH PROPOFOL;  Surgeon: Wyline Mood, MD;  Location: [facility] ENDOSCOPY;  Service: Gastroenterology;  Laterality: N/A;   COLONOSCOPY WITH PROPOFOL N/A 12/08/2022   Procedure: COLONOSCOPY WITH PROPOFOL;  Surgeon: Regis Bill, MD;  Location: [facility] ENDOSCOPY;  Service: Endoscopy;  Laterality: N/A;    ESOPHAGOGASTRODUODENOSCOPY (EGD) WITH PROPOFOL N/A 06/23/2019   Procedure: ESOPHAGOGASTRODUODENOSCOPY (EGD) WITH PROPOFOL;  Surgeon: Wyline Mood, MD;  Location: [facility] ENDOSCOPY;  Service: Gastroenterology;  Laterality: N/A;   ESOPHAGOGASTRODUODENOSCOPY (EGD) WITH PROPOFOL N/A 12/08/2022   Procedure: ESOPHAGOGASTRODUODENOSCOPY (EGD) WITH PROPOFOL;  Surgeon: Regis Bill, MD;  Location: [facility] ENDOSCOPY;  Service: Endoscopy;  Laterality: N/A;  DM, OZEMPIC   ESOPHAGOGASTRODUODENOSCOPY (EGD) WITH PROPOFOL N/A 01/12/2023   Procedure: ESOPHAGOGASTRODUODENOSCOPY (EGD) WITH PROPOFOL;  Surgeon: Regis Bill, MD;  Location: [facility] ENDOSCOPY;  Service: Endoscopy;  Laterality: N/A;  DM   JOINT REPLACEMENT     TONSILLECTOMY      Family Psychiatric History: I have reviewed family psychiatric history from progress note on 08/21/2019.  Family History:  Family History  Problem Relation Age of Onset   Mental illness Father    Bipolar disorder Brother    Bipolar disorder Paternal Uncle    Bipolar disorder Paternal Uncle     Social History: I have reviewed social history from progress note on 08/21/2019. Social History   Socioeconomic History   Marital status: Divorced    Spouse name: Not on file   Number of children: 2   Years of education: Not on file   Highest education level: Not on file  Occupational History   Not on file  Tobacco Use   Smoking status: Former    Types: Cigarettes    Quit date: 03/03/2011    Years since quitting: 12.0   Smokeless tobacco: Never  Vaping Use   Vaping Use: Never used  Substance and Sexual Activity   Alcohol use: Not Currently   Drug use: Not Currently   Sexual activity: Not Currently  Other Topics Concern   Not on file  Social History Narrative   Not on file   Social Determinants of Health   Financial Resource Strain: Not on file  Food Insecurity: Not on file  Transportation Needs: Not on file  Physical Activity: Not on file  Stress: Not on  file  Social Connections: Not on file    Allergies:  Allergies  Allergen Reactions   Lithium Nausea And Vomiting and Other (See Comments)   Penicillins Hives    Resulted in hospitalization   Sulfa Antibiotics Hives    Resulted in hospitalization   Benzonatate Other (See Comments)    Metabolic Disorder Labs: Lab Results  Component Value Date   HGBA1C 7.3 (H) 06/22/2019   MPG 162.81 06/22/2019   No results found for: "PROLACTIN" No results found for: "CHOL", "TRIG", "HDL", "CHOLHDL", "VLDL", "LDLCALC" Lab Results  Component Value Date   TSH 1.463 12/29/2022   TSH 1.737 11/16/2019    Therapeutic Level Labs: No results found for: "LITHIUM" No results found for: "VALPROATE" No results found for: "CBMZ"  Current Medications: Current Outpatient Medications  Medication Sig Dispense Refill   alendronate (FOSAMAX) 35 MG tablet Take 35 mg by mouth every 7 (seven) days. Take with a full glass of water on an empty stomach.     amLODipine (NORVASC) 2.5 MG tablet Take by mouth.     ARIPiprazole (ABILIFY) 10 MG tablet TAKE ONE TABLET BY MOUTH ONE TIME DAILY 90 tablet 0   benztropine (COGENTIN) 0.5 MG  tablet Take 1 tablet (0.5 mg total) by mouth daily as needed for tremors. 15 tablet 1   furosemide (LASIX) 20 MG tablet Take 10 mg by mouth daily as needed.     HYDROcodone-acetaminophen (NORCO) 10-325 MG tablet Take 1 tablet by mouth every 8 (eight) hours as needed for severe pain. Must last 30 days. 90 tablet 0   [START ON 04/04/2023] HYDROcodone-acetaminophen (NORCO) 10-325 MG tablet Take 1 tablet by mouth every 8 (eight) hours as needed for severe pain. Must last 30 days. 90 tablet 0   [START ON 05/04/2023] HYDROcodone-acetaminophen (NORCO) 10-325 MG tablet Take 1 tablet by mouth every 8 (eight) hours as needed for severe pain. Must last 30 days. 90 tablet 0   hydrOXYzine (VISTARIL) 25 MG capsule Take 25 mg by mouth daily as needed for anxiety.     meclizine (ANTIVERT) 25 MG tablet Take by  mouth 2 (two) times daily as needed.     metFORMIN (GLUCOPHAGE) 500 MG tablet Take 500 mg by mouth 2 (two) times daily with a meal.     ondansetron (ZOFRAN) 8 MG tablet Take 1 tablet (8 mg total) by mouth every 8 (eight) hours as needed for nausea or vomiting. 40 tablet 0   OZEMPIC, 1 MG/DOSE, 4 MG/3ML SOPN Inject 1 mg into the skin once a week.     pantoprazole (PROTONIX) 40 MG tablet Take 40 mg by mouth daily.     polyethylene glycol powder (GLYCOLAX/MIRALAX) 17 GM/SCOOP powder SMARTSIG:17 Gram(s) By Mouth 1-2 Times Daily     rifaximin (XIFAXAN) 550 MG TABS tablet Take by mouth.     spironolactone (ALDACTONE) 25 MG tablet Take 25 mg by mouth daily.     tizanidine (ZANAFLEX) 2 MG capsule Take 2 mg by mouth 3 (three) times daily as needed for muscle spasms. Takes differently     escitalopram (LEXAPRO) 10 MG tablet Take 1 tablet (10 mg total) by mouth daily. 90 tablet 1   [START ON 04/22/2023] lamoTRIgine (LAMICTAL) 200 MG tablet Take 1 tablet (200 mg total) by mouth daily. 90 tablet 0   No current facility-administered medications for this visit.     Musculoskeletal: Strength & Muscle Tone:  UTA Gait & Station: normal Patient leans: N/A  Psychiatric Specialty Exam: Review of Systems  Musculoskeletal:        Leg cramps on and off, BL  Psychiatric/Behavioral: Negative.    All other systems reviewed and are negative.   There were no vitals taken for this visit.There is no height or weight on file to calculate BMI.  General Appearance: Casual  Eye Contact:  Fair  Speech:  Clear and Coherent  Volume:  Normal  Mood:  Euthymic  Affect:  Congruent  Thought Process:  Goal Directed and Descriptions of Associations: Intact  Orientation:  Full (Time, Place, and Person)  Thought Content: Logical   Suicidal Thoughts:  No  Homicidal Thoughts:  No  Memory:  Immediate;   Fair Recent;   Fair Remote;   Fair  Judgement:  Fair  Insight:  Fair  Psychomotor Activity:  Normal  Concentration:   Concentration: Fair and Attention Span: Fair  Recall:  [employer] of Knowledge: Fair  Language: Fair  Akathisia:  No  Handed:  Right  AIMS (if indicated): not done  Assets:  Communication Skills Desire for Improvement Housing Social Support  ADL's:  Intact  Cognition: WNL  Sleep:  Fair   Screenings: AIMS    Flowsheet Row Video Visit from 03/16/2023 in  [facility] Office Visit from 10/13/2022 in [facility] Video Visit from 05/14/2022 in [facility] Video Visit from 02/13/2022 in [facility] Office Visit from 03/04/2021 in [facility]  AIMS Total Score 0 0 0 0 0      GAD-7    Flowsheet Row Office Visit from 10/13/2022 in [facility] Video Visit from 02/13/2022 in [facility]  Total GAD-7 Score 2 2      PHQ2-9    Flowsheet Row Video Visit from 03/16/2023 in [facility] Office Visit from 02/18/2023 in [facility] Visit from 11/12/2022 in [facility] Visit from 10/13/2022 in [facility] Video Visit from 07/23/2022 in [facility]  PHQ-2 Total Score 0 0 0 0 0  PHQ-9 Total Score -- -- -- 2 --      Flowsheet Row Video Visit from 03/16/2023 in [facility] Admission (Discharged) from 01/12/2023 in [facility] Admission (Discharged) from 12/08/2022 in [facility]  C-SSRS RISK CATEGORY No Risk No Risk No Risk        Assessment and Plan: Margaret [name] is a  Caucasian female who has a history of bipolar disorder, chronic pain on disability, divorced, lives in [place] was evaluated by telemedicine today.  Patient is currently stable.  Plan as noted below.  Plan Bipolar disorder in remission, most recent episode depressed Lamotrigine 200 mg p.o. daily Abilify 10 mg p.o. daily Benztropine 0.5 mg as needed for tremors.  Other specified anxiety with limited symptom attack-stable Lexapro at reduced dosage of 10 mg p.o. daily Hydroxyzine 25-50 mg p.o. daily as needed.  I have reviewed notes per Dr. Brahmanday-03/05/2023-patient treated for iron deficiency anemia, thrombocytopenia.  Follow-up in clinic in 4 months or sooner in person.   Collaboration of Care: Collaboration of Care: Other I have reviewed notes per Dr. Donneta Romberg as well as labs including CBC, CMP, hemoglobin A1c and lipid panel-02/09/2023 - 03/05/2023.  Patient/Guardian was advised Release of Information must be obtained prior to any record release in order to collaborate their care with an outside provider. Patient/Guardian was advised if they have not already done so to contact the registration department to sign all necessary forms in order for [place] to release information regarding their care.   Consent: Patient/Guardian gives verbal consent for treatment and assignment of benefits for services provided during this visit. Patient/Guardian expressed understanding and agreed to proceed.   This note was generated in part or whole with voice recognition software. Voice recognition is usually quite accurate but there are transcription errors that can and very often do occur. I apologize for any typographical errors that were not detected and corrected.    Jomarie Longs, MD 03/16/2023, 2:08 PM

## 2023-03-25 ENCOUNTER — Encounter: Payer: Self-pay | Admitting: Internal Medicine

## 2023-03-25 ENCOUNTER — Telehealth: Payer: Self-pay | Admitting: *Deleted

## 2023-03-25 NOTE — Telephone Encounter (Signed)
Patient called reporting that she is having "a lot of bleeding up under the skin on my artms and I think I need to be seen in the next wek or 2". Please advise

## 2023-03-26 ENCOUNTER — Encounter: Payer: Self-pay | Admitting: Internal Medicine

## 2023-03-30 ENCOUNTER — Ambulatory Visit
Admission: RE | Admit: 2023-03-30 | Discharge: 2023-03-30 | Disposition: A | Discharge status: Home / Self Care | Payer: 59 | Source: Ambulatory Visit | Attending: Gastroenterology | Admitting: Gastroenterology

## 2023-03-30 DIAGNOSIS — K7460 Unspecified cirrhosis of liver: Secondary | ICD-10-CM

## 2023-03-30 DIAGNOSIS — N2889 Other specified disorders of kidney and ureter: Secondary | ICD-10-CM

## 2023-03-30 HISTORY — DX: Other specified disorders of kidney and ureter: N28.89

## 2023-03-30 MED ORDER — GADOPICLENOL 0.5 MMOL/ML IV SOLN
7.5000 mL | Freq: Once | INTRAVENOUS | Status: AC | PRN
  Administered 2023-03-30: 7.5 mL via INTRAVENOUS

## 2023-03-31 ENCOUNTER — Telehealth: Payer: Self-pay | Admitting: *Deleted

## 2023-03-31 NOTE — Telephone Encounter (Signed)
Patient called reporting that she is having "bleeding under my skin on my arms' and that she had the leak in her stomach fixed and this has happened since surer. She states that she cannot come in to be seen this week as she has a 65 year old with her but would like to be seen next week. Please advise.

## 2023-04-02 ENCOUNTER — Other Ambulatory Visit: Payer: Self-pay | Admitting: *Deleted

## 2023-04-02 DIAGNOSIS — D649 Anemia, unspecified: Secondary | ICD-10-CM

## 2023-04-02 DIAGNOSIS — T148XXA Other injury of unspecified body region, initial encounter: Secondary | ICD-10-CM

## 2023-04-05 ENCOUNTER — Inpatient Hospital Stay: Payer: 59 | Attending: Internal Medicine | Admitting: Nurse Practitioner

## 2023-04-05 ENCOUNTER — Encounter: Payer: Self-pay | Admitting: Nurse Practitioner

## 2023-04-05 ENCOUNTER — Inpatient Hospital Stay: Payer: 59

## 2023-04-05 VITALS — BP 128/57 | HR 65 | Temp 96.9°F | Wt 163.5 lb

## 2023-04-05 DIAGNOSIS — K922 Gastrointestinal hemorrhage, unspecified: Secondary | ICD-10-CM | POA: Insufficient documentation

## 2023-04-05 DIAGNOSIS — D500 Iron deficiency anemia secondary to blood loss (chronic): Secondary | ICD-10-CM

## 2023-04-05 DIAGNOSIS — D696 Thrombocytopenia, unspecified: Secondary | ICD-10-CM

## 2023-04-05 DIAGNOSIS — R233 Spontaneous ecchymoses: Secondary | ICD-10-CM

## 2023-04-05 DIAGNOSIS — Z87891 Personal history of nicotine dependence: Secondary | ICD-10-CM | POA: Diagnosis not present

## 2023-04-05 DIAGNOSIS — Z79899 Other long term (current) drug therapy: Secondary | ICD-10-CM | POA: Diagnosis not present

## 2023-04-05 DIAGNOSIS — D649 Anemia, unspecified: Secondary | ICD-10-CM

## 2023-04-05 DIAGNOSIS — T148XXA Other injury of unspecified body region, initial encounter: Secondary | ICD-10-CM

## 2023-04-05 LAB — CBC WITH DIFFERENTIAL (CANCER CENTER ONLY)
Abs Immature Granulocytes: 0.03 10*3/uL (ref 0.00–0.07)
Basophils Absolute: 0 10*3/uL (ref 0.0–0.1)
Basophils Relative: 1 %
Eosinophils Absolute: 0 10*3/uL (ref 0.0–0.5)
Eosinophils Relative: 1 %
HCT: 34 % — ABNORMAL LOW (ref 36.0–46.0)
Hemoglobin: 11 g/dL — ABNORMAL LOW (ref 12.0–15.0)
Immature Granulocytes: 1 %
Lymphocytes Relative: 13 %
Lymphs Abs: 0.8 10*3/uL (ref 0.7–4.0)
MCH: 26.7 pg (ref 26.0–34.0)
MCHC: 32.4 g/dL (ref 30.0–36.0)
MCV: 82.5 fL (ref 80.0–100.0)
Monocytes Absolute: 0.7 10*3/uL (ref 0.1–1.0)
Monocytes Relative: 10 %
Neutro Abs: 4.9 10*3/uL (ref 1.7–7.7)
Neutrophils Relative %: 74 %
Platelet Count: 111 10*3/uL — ABNORMAL LOW (ref 150–400)
RBC: 4.12 MIL/uL (ref 3.87–5.11)
RDW: 17.2 % — ABNORMAL HIGH (ref 11.5–15.5)
WBC Count: 6.5 10*3/uL (ref 4.0–10.5)
nRBC: 0 % (ref 0.0–0.2)

## 2023-04-05 LAB — PROTIME-INR
INR: 1.2 (ref 0.8–1.2)
Prothrombin Time: 15.7 seconds — ABNORMAL HIGH (ref 11.4–15.2)

## 2023-04-05 LAB — CMP (CANCER CENTER ONLY)
ALT: 25 U/L (ref 0–44)
AST: 35 U/L (ref 15–41)
Albumin: 3.8 g/dL (ref 3.5–5.0)
Alkaline Phosphatase: 94 U/L (ref 38–126)
Anion gap: 7 (ref 5–15)
BUN: 18 mg/dL (ref 8–23)
CO2: 29 mmol/L (ref 22–32)
Calcium: 9.1 mg/dL (ref 8.9–10.3)
Chloride: 99 mmol/L (ref 98–111)
Creatinine: 1.09 mg/dL — ABNORMAL HIGH (ref 0.44–1.00)
GFR, Estimated: 56 mL/min — ABNORMAL LOW (ref >60)
Glucose, Bld: 161 mg/dL — ABNORMAL HIGH (ref 70–99)
Potassium: 4.3 mmol/L (ref 3.5–5.1)
Sodium: 135 mmol/L (ref 135–145)
Total Bilirubin: 1.1 mg/dL (ref 0.3–1.2)
Total Protein: 7.1 g/dL (ref 6.5–8.1)

## 2023-04-05 LAB — SAMPLE TO BLOOD BANK

## 2023-04-05 LAB — APTT: aPTT: 29 seconds (ref 24–36)

## 2023-04-05 NOTE — Progress Notes (Signed)
[facility]  [facility] A Department of the [place] [address], [address] [place], [place] [address] [phone] (phone) [phone] (fax)  Patient Care Team: Danella Penton, MD as PCP - General (Internal Medicine) Earna Coder, MD as Consulting Physician (Internal Medicine)   Name of the patient: Margaret [name]  [id number]  [birth year]   Date of visit: 04/05/23  Diagnosis- anemia & thrombocytopenia  Chief complaint/ Reason for visit- bruising  Heme/Onc History:  06/21/2019-06/24/2019 due to severe anemia; hemoglobin of 5.7 upon admission; patient received 2 units of blood transfusion, 3 doses of IV iron. EGD and colonoscopy were performed during the hospitalization EGD and colonoscopy on 06/23/2019. [Dr.Anna; [facility]] EGD showed no abnormalities and the colonoscopy showed 3 sessile diminutive polyps which were resected completely.  Arteries of the duodenum were normal.  3 colon polyps resected were serrated polyp x2 and a tubular adenoma x1. No active source of bleeding was noted.    Patient was scheduled for capsule endoscopy as outpatient?    # Cirrhosis NAFLD- [? [facility]]   Interval history- Patient is 67 year old female with history of cirrhosis d/t NAFLD, iron deficiency d/t GAVE (Dr. Mia Creek), who presents to clinic for concerns of bruising of her arms. She has hit her arm on several objects including her walker and recently had skin tear. She's noticed easy bruising however and is concerned of appearance. Nonpainful. Denies spontaneous bruising or bleeding. Otherwise feels well. Energy has improved. Denies black or bloody stools. No abdominal pian, nausea, or vomiting.   Review of systems- Review of Systems  Constitutional:  Negative for malaise/fatigue and weight loss.  Respiratory:  Negative for hemoptysis and shortness of breath.   Cardiovascular:  Negative for chest pain.   Gastrointestinal:  Negative for abdominal pain, blood in stool and melena.  Genitourinary:  Negative for hematuria.  Skin:  Positive for rash. Negative for itching.  Endo/Heme/Allergies:  Bruises/bleeds easily.     Allergies  Allergen Reactions   Lithium Nausea And Vomiting and Other (See Comments)   Penicillins Hives    Resulted in hospitalization   Sulfa Antibiotics Hives    Resulted in hospitalization   Benzonatate Other (See Comments)    Past Medical History:  Diagnosis Date   Bipolar 1 disorder (HCC)    Depression    Diabetes mellitus without complication (HCC)    Type 2   GERD (gastroesophageal reflux disease)    Hypertension    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H (HCC)    Osteoporosis     Past Surgical History:  Procedure Laterality Date   ABDOMINAL HYSTERECTOMY     CHOLECYSTECTOMY     COLONOSCOPY WITH PROPOFOL N/A 06/23/2019   Procedure: COLONOSCOPY WITH PROPOFOL;  Surgeon: Wyline Mood, MD;  Location: [facility] ENDOSCOPY;  Service: Gastroenterology;  Laterality: N/A;   COLONOSCOPY WITH PROPOFOL N/A 12/08/2022   Procedure: COLONOSCOPY WITH PROPOFOL;  Surgeon: Regis Bill, MD;  Location: [facility] ENDOSCOPY;  Service: Endoscopy;  Laterality: N/A;   ESOPHAGOGASTRODUODENOSCOPY (EGD) WITH PROPOFOL N/A 06/23/2019   Procedure: ESOPHAGOGASTRODUODENOSCOPY (EGD) WITH PROPOFOL;  Surgeon: Wyline Mood, MD;  Location: [facility] ENDOSCOPY;  Service: Gastroenterology;  Laterality: N/A;   ESOPHAGOGASTRODUODENOSCOPY (EGD) WITH PROPOFOL N/A 12/08/2022   Procedure: ESOPHAGOGASTRODUODENOSCOPY (EGD) WITH PROPOFOL;  Surgeon: Regis Bill, MD;  Location: [facility] ENDOSCOPY;  Service: Endoscopy;  Laterality: N/A;  DM, OZEMPIC   ESOPHAGOGASTRODUODENOSCOPY (EGD) WITH PROPOFOL N/A 01/12/2023   Procedure: ESOPHAGOGASTRODUODENOSCOPY (EGD) WITH PROPOFOL;  Surgeon: Regis Bill, MD;  Location: [facility] ENDOSCOPY;  Service: Endoscopy;  Laterality: N/A;  DM   JOINT REPLACEMENT     TONSILLECTOMY       Social History   Socioeconomic History   Marital status: Divorced    Spouse name: Not on file   Number of children: 2   Years of education: Not on file   Highest education level: Not on file  Occupational History   Not on file  Tobacco Use   Smoking status: Former    Types: Cigarettes    Quit date: 03/03/2011    Years since quitting: 12.0   Smokeless tobacco: Never  Vaping Use   Vaping Use: Never used  Substance and Sexual Activity   Alcohol use: Not Currently   Drug use: Not Currently   Sexual activity: Not Currently  Other Topics Concern   Not on file  Social History Narrative   Not on file   Social Determinants of Health   Financial Resource Strain: Not on file  Food Insecurity: Not on file  Transportation Needs: Not on file  Physical Activity: Not on file  Stress: Not on file  Social Connections: Not on file  Intimate Partner Violence: Not on file    Family History  Problem Relation Age of Onset   Mental illness Father    Bipolar disorder Brother    Bipolar disorder Paternal Uncle    Bipolar disorder Paternal Uncle      Current Outpatient Medications:    alendronate (FOSAMAX) 35 MG tablet, Take 35 mg by mouth every 7 (seven) days. Take with a full glass of water on an empty stomach., Disp: , Rfl:    amLODipine (NORVASC) 2.5 MG tablet, Take by mouth., Disp: , Rfl:    ARIPiprazole (ABILIFY) 10 MG tablet, TAKE ONE TABLET BY MOUTH ONE TIME DAILY, Disp: 90 tablet, Rfl: 0   benztropine (COGENTIN) 0.5 MG tablet, Take 1 tablet (0.5 mg total) by mouth daily as needed for tremors., Disp: 15 tablet, Rfl: 1   escitalopram (LEXAPRO) 10 MG tablet, Take 1 tablet (10 mg total) by mouth daily., Disp: 90 tablet, Rfl: 1   furosemide (LASIX) 20 MG tablet, Take 10 mg by mouth daily as needed., Disp: , Rfl:    HYDROcodone-acetaminophen (NORCO) 10-325 MG tablet, Take 1 tablet by mouth every 8 (eight) hours as needed for severe pain. Must last 30 days., Disp: 90 tablet, Rfl: 0    [START ON 05/04/2023] HYDROcodone-acetaminophen (NORCO) 10-325 MG tablet, Take 1 tablet by mouth every 8 (eight) hours as needed for severe pain. Must last 30 days., Disp: 90 tablet, Rfl: 0   hydrOXYzine (VISTARIL) 25 MG capsule, Take 25 mg by mouth daily as needed for anxiety., Disp: , Rfl:    [START ON 04/22/2023] lamoTRIgine (LAMICTAL) 200 MG tablet, Take 1 tablet (200 mg total) by mouth daily., Disp: 90 tablet, Rfl: 0   meclizine (ANTIVERT) 25 MG tablet, Take by mouth 2 (two) times daily as needed., Disp: , Rfl:    metFORMIN (GLUCOPHAGE) 500 MG tablet, Take 500 mg by mouth 2 (two) times daily with a meal., Disp: , Rfl:    ondansetron (ZOFRAN) 8 MG tablet, Take 1 tablet (8 mg total) by mouth every 8 (eight) hours as needed for nausea or vomiting., Disp: 40 tablet, Rfl: 0   OZEMPIC, 1 MG/DOSE, 4 MG/3ML SOPN, Inject 1 mg into the skin once a week., Disp: , Rfl:    pantoprazole (PROTONIX) 40 MG tablet, Take 40 mg  by mouth daily., Disp: , Rfl:    polyethylene glycol powder (GLYCOLAX/MIRALAX) 17 GM/SCOOP powder, SMARTSIG:17 Gram(s) By Mouth 1-2 Times Daily, Disp: , Rfl:    rifaximin (XIFAXAN) 550 MG TABS tablet, Take by mouth., Disp: , Rfl:    spironolactone (ALDACTONE) 25 MG tablet, Take 25 mg by mouth daily., Disp: , Rfl:    tizanidine (ZANAFLEX) 2 MG capsule, Take 2 mg by mouth 3 (three) times daily as needed for muscle spasms. Takes differently, Disp: , Rfl:   Physical exam:  Vitals:   04/05/23 1054  BP: (!) 128/57  Pulse: 65  Temp: (!) 96.9 F (36.1 C)  TempSrc: Tympanic  Weight: 163 lb 8 oz (74.2 kg)   Physical Exam Constitutional:      Appearance: She is not ill-appearing.  Cardiovascular:     Rate and Rhythm: Normal rate.  Pulmonary:     Effort: No respiratory distress.  Skin:    Findings: Bruising (bruising of dorsal surfaces of lower arms. Skin tear left elbow is covered with dressing. No bruising noticed elsewhere. No petechial rashes.) present.  Neurological:     Mental  Status: She is alert and oriented to person, place, and time.  Psychiatric:        Mood and Affect: Mood normal.        Behavior: Behavior normal.        Latest Ref Rng & Units 04/05/2023   10:34 AM  CMP  Glucose 70 - 99 mg/dL 161   BUN 8 - 23 mg/dL 18   Creatinine 0.96 - 1.00 mg/dL 0.45   Sodium 409 - 811 mmol/L 135   Potassium 3.5 - 5.1 mmol/L 4.3   Chloride 98 - 111 mmol/L 99   CO2 22 - 32 mmol/L 29   Calcium 8.9 - 10.3 mg/dL 9.1   Total Protein 6.5 - 8.1 g/dL 7.1   Total Bilirubin 0.3 - 1.2 mg/dL 1.1   Alkaline Phos 38 - 126 U/L 94   AST 15 - 41 U/L 35   ALT 0 - 44 U/L 25       Latest Ref Rng & Units 04/05/2023   10:34 AM  CBC  WBC 4.0 - 10.5 K/uL 6.5   Hemoglobin 12.0 - 15.0 g/dL 91.4   Hematocrit 78.2 - 46.0 % 34.0   Platelets 150 - 400 K/uL 111    She is on coumadin.  Latest available INR is: Lab Results  Component Value Date   INR 1.2 04/05/2023   INR 1.3 (H) 01/19/2023   INR 1.4 (H) 01/08/2023   No images are attached to the encounter.  MR ABDOMEN WWO CONTRAST  Result Date: 03/30/2023 CLINICAL DATA:  History of cirrhosis and porta hepatis adenopathy. EXAM: MRI ABDOMEN WITHOUT AND WITH CONTRAST TECHNIQUE: Multiplanar multisequence MR imaging of the abdomen was performed both before and after the administration of intravenous contrast. CONTRAST:  7.5 cc Vueway IV COMPARISON:  02/09/2023 abdominal sonogram. 02/03/2023 chest CT. 01/06/2020 MRI abdomen. FINDINGS: Lower chest: No acute abnormality at the lung bases. Chronic moderate posterior right lower mediastinal varices. Hepatobiliary: Mildly atrophic liver with diffusely irregular liver surface compatible with cirrhosis. Patchy lace-like T2 hyperintensity throughout the liver parenchyma compatible with confluent hepatic fibrosis. No hepatic steatosis. No discrete liver masses. Specifically, no arterial phase enhancing lesions. Cholecystectomy. Bile ducts are within normal post cholecystectomy limits. Common bile duct  diameter 8 mm. No choledocholithiasis. No biliary masses, strictures or beading. Pancreas: No pancreatic mass or duct dilation.  No pancreas divisum. Spleen:  Mild splenomegaly.  Craniocaudal splenic length 14.0 cm. Adrenals/Urinary Tract: Normal adrenals. No hydronephrosis. Small complex cystic 1.2 x 1.2 cm posterior mid to lower right renal cortical mass (series 19/image 21) with mildly thickened enhancing internal septations, considered Bosniak category 3, new from 01/06/2020 MRI. Small mildly complex 1.0 cm anterior lower left renal cyst with heterogeneous mild T1 hyperintensity and no definite solid enhancement on the motion degraded subtraction sequences, favor Bosniak category 2. Additional tiny subcentimeter simple and T1 hyperintense renal cortical lesions scattered in both kidneys without appreciable enhancement, compatible with tiny Bosniak category 1 and category 2 renal cysts. Stomach/Bowel: Normal non-distended stomach. Visualized small and large bowel is normal caliber, with no bowel wall thickening. Vascular/Lymphatic: Atherosclerotic nonaneurysmal abdominal aorta. Patent portal, hepatic, splenic and renal veins. No renal vein tumor thrombus. Chronic moderate perisplenic and retroperitoneal varices. Mild-to-moderate gastrohepatic ligament and porta hepatis lymphadenopathy is not substantially changed from 01/06/2020 MRI. Representative 1.8 cm porta hepatis node (series 11/image 38), previously 2.1 cm. Representative 1.2 cm gastrohepatic ligament node (series 11/image 49), previously 1.2 cm. Other: No abdominal ascites or focal fluid collection. Musculoskeletal: No aggressive appearing focal osseous lesions. IMPRESSION: 1. Small complex cystic 1.2 cm posterior mid to lower right renal cortical mass with mildly thickened enhancing internal septations, considered Bosniak category 3, new from 01/06/2020 MRI, cannot exclude cystic renal cell carcinoma. No evidence of renal vein tumor thrombus. Urology  consultation suggested. 2. Cirrhosis. No suspicious liver masses. 3. Mild splenomegaly. Chronic moderate perisplenic, retroperitoneal and low posterior mediastinal varices. No ascites. 4. Chronic mild-to-moderate gastrohepatic ligament and porta hepatis lymphadenopathy, not substantially changed since 01/06/2020 MRI, favoring reactive etiology. These results will be called to the ordering clinician or representative by the Radiologist Assistant, and communication documented in the PACS or [employer]. Electronically Signed   By: Delbert Phenix M.D.   On: 03/30/2023 12:48    Assessment and plan- Patient is a 67 y.o. female who presents for acute visit for easy bruising.   Easy bruising- likely secondary to thrombocytopenia, thin skin. Bruising appears to be secondary to injury/trauma and not spontaneous. Labs are stable and pt and ptt are normal. Recommend monitoring for now.  Thrombocytopenia- mild. Hemoglobin has decreased but remains > 100. We reviewed role of platelets in body and that her decreased counts are secondary to cirrhosis and hypersplenism. No additional intervention is needed currently.  Iron Deficiency anemia- secondary to gi bleed/cirrhosis, GAVE. Hemoglobin has decreased from 12 to 11. Plan for additional dose of venofer. Follow up with Dr Donneta Romberg as scheduled.    DISPOSITION: Venofer x 1 in next 1-2 weeks Follow up with Dr. Donneta Romberg as scheduled.  Rtc sooner if symptoms don't improve or worsen.    Visit Diagnosis 1. Thrombocytopenia (HCC)   2. Easy bruising   3. Iron deficiency anemia due to chronic blood loss    Patient expressed understanding and was in agreement with this plan. She also understands that She can call clinic at any time with any questions, concerns, or complaints.   Thank you for allowing me to participate in the care of this very pleasant patient.   Consuello Masse, DNP, AGNP-C, AOCNP [facility] [phone]  CC: Dr  Donneta Romberg

## 2023-04-09 ENCOUNTER — Telehealth: Payer: Self-pay | Admitting: *Deleted

## 2023-04-09 NOTE — Telephone Encounter (Signed)
Patient called stating Leotis Shames was to have called her about her Bleeding time, but she never got a call. Please advise  Component Ref Range & Units 4 d ago (04/05/23) 2 mo ago (01/19/23) 3 mo ago (01/08/23) 2 yr ago (05/06/20) 3 yr ago (12/06/19) 3 yr ago (06/21/19)  Prothrombin Time 11.4 - 15.2 seconds 15.7 High  15.6 High  16.8 High  14.8 11.7 R 15.5 High   INR 0.8 - 1.2 1.2 1.3 High  CM 1.4 High  CM 1.2 CM 1.1 R, CM 1.2 CM

## 2023-04-09 NOTE — Telephone Encounter (Signed)
Call returned to patient and went over results and Margaret [name]'s comment on results

## 2023-04-19 ENCOUNTER — Inpatient Hospital Stay: Payer: 59

## 2023-04-19 VITALS — BP 117/50 | HR 67 | Temp 98.4°F | Resp 18

## 2023-04-19 DIAGNOSIS — D500 Iron deficiency anemia secondary to blood loss (chronic): Secondary | ICD-10-CM | POA: Diagnosis not present

## 2023-04-19 MED ORDER — SODIUM CHLORIDE 0.9 % IV SOLN
200.0000 mg | Freq: Once | INTRAVENOUS | Status: AC
  Administered 2023-04-19: 200 mg via INTRAVENOUS
  Filled 2023-04-19: qty 200

## 2023-04-19 MED ORDER — SODIUM CHLORIDE 0.9 % IV SOLN
Freq: Once | INTRAVENOUS | Status: AC
  Filled 2023-04-19: qty 250

## 2023-04-19 NOTE — Progress Notes (Signed)
Pt has been educated and understands. Pt declined to stay 30 mins after iron infusion. VSS.  

## 2023-04-20 ENCOUNTER — Encounter: Payer: Self-pay | Admitting: Internal Medicine

## 2023-04-23 ENCOUNTER — Ambulatory Visit (INDEPENDENT_AMBULATORY_CARE_PROVIDER_SITE_OTHER): Payer: 59 | Admitting: Urology

## 2023-04-23 ENCOUNTER — Encounter: Payer: Self-pay | Admitting: Urology

## 2023-04-23 VITALS — BP 112/61 | HR 76 | Ht 60.0 in | Wt 163.0 lb

## 2023-04-23 DIAGNOSIS — N281 Cyst of kidney, acquired: Secondary | ICD-10-CM | POA: Diagnosis not present

## 2023-04-23 DIAGNOSIS — N2889 Other specified disorders of kidney and ureter: Secondary | ICD-10-CM

## 2023-04-23 LAB — URINALYSIS, COMPLETE
Bilirubin, UA: NEGATIVE
Glucose, UA: NEGATIVE
Nitrite, UA: NEGATIVE
RBC, UA: NEGATIVE
Specific Gravity, UA: 1.015 (ref 1.005–1.030)
Urobilinogen, Ur: 1 mg/dL (ref 0.2–1.0)
pH, UA: 5.5 (ref 5.0–7.5)

## 2023-04-23 LAB — MICROSCOPIC EXAMINATION: Epithelial Cells (non renal): >10 /hpf — AB (ref 0–10)

## 2023-04-23 NOTE — Progress Notes (Signed)
I, Maysun L Gibbs,acting as a scribe for Riki Altes, MD.,have documented all relevant documentation on the behalf of Riki Altes, MD,as directed by  Riki Altes, MD while in the presence of Riki Altes, MD.  04/23/2023 1:46 PM   Caffie Damme 08-Feb-1956 [id number]  Referring provider: Louellen Molder, NP [address] [facility] [place],  [place] [address]  Chief Complaint  Patient presents with   Hydronephrosis    Renal mass    HPI: Margaret [name] is a 67 y.o. female referred for evaluation of a renal mass.  Evaluation by gastroenterology for history of cirrhosis and porta hepatis adenopathy.  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 This was not seen on an MRI March 2021.  Denies gross hematuria or bothersome lower urinary tract symptoms   PMH: Past Medical History:  Diagnosis Date   Bipolar 1 disorder (HCC)    Depression    Diabetes mellitus without complication (HCC)    Type 2   GERD (gastroesophageal reflux disease)    Hypertension    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H [facility])    Osteoporosis     Surgical History: Past Surgical History:  Procedure Laterality Date   ABDOMINAL HYSTERECTOMY     CHOLECYSTECTOMY     COLONOSCOPY WITH PROPOFOL N/A 06/23/2019   Procedure: COLONOSCOPY WITH PROPOFOL;  Surgeon: Wyline Mood, MD;  Location: [facility] ENDOSCOPY;  Service: Gastroenterology;  Laterality: N/A;   COLONOSCOPY WITH PROPOFOL N/A 12/08/2022   Procedure: COLONOSCOPY WITH PROPOFOL;  Surgeon: Regis Bill, MD;  Location: [facility] ENDOSCOPY;  Service: Endoscopy;  Laterality: N/A;   ESOPHAGOGASTRODUODENOSCOPY (EGD) WITH PROPOFOL N/A 06/23/2019   Procedure: ESOPHAGOGASTRODUODENOSCOPY (EGD) WITH PROPOFOL;  Surgeon: Wyline Mood, MD;  Location: [facility] ENDOSCOPY;  Service: Gastroenterology;  Laterality: N/A;    ESOPHAGOGASTRODUODENOSCOPY (EGD) WITH PROPOFOL N/A 12/08/2022   Procedure: ESOPHAGOGASTRODUODENOSCOPY (EGD) WITH PROPOFOL;  Surgeon: Regis Bill, MD;  Location: [facility] ENDOSCOPY;  Service: Endoscopy;  Laterality: N/A;  DM, OZEMPIC   ESOPHAGOGASTRODUODENOSCOPY (EGD) WITH PROPOFOL N/A 01/12/2023   Procedure: ESOPHAGOGASTRODUODENOSCOPY (EGD) WITH PROPOFOL;  Surgeon: Regis Bill, MD;  Location: [facility] ENDOSCOPY;  Service: Endoscopy;  Laterality: N/A;  DM   JOINT REPLACEMENT     TONSILLECTOMY      Home Medications:  Allergies as of 04/23/2023       Reactions   Lithium Nausea And Vomiting, Other (See Comments)   Penicillins Hives   Resulted in hospitalization   Sulfa Antibiotics Hives   Resulted in hospitalization   Benzonatate Other (See Comments)        Medication List        Accurate as of April 23, 2023  1:46 PM. If you have any questions, ask your nurse or doctor.          alendronate 35 MG tablet Commonly known as: FOSAMAX Take 35 mg by mouth every 7 (seven) days. Take with a full glass of water on an empty stomach.   amLODipine 2.5 MG tablet Commonly known as: NORVASC Take by mouth.   ARIPiprazole 10 MG tablet Commonly known as: ABILIFY TAKE ONE TABLET BY MOUTH ONE TIME DAILY   benztropine 0.5 MG tablet Commonly known as: COGENTIN Take 1 tablet (0.5 mg total) by mouth daily as needed for tremors.   escitalopram 10 MG tablet Commonly known as: LEXAPRO Take 1 tablet (10  mg total) by mouth daily.   furosemide 20 MG tablet Commonly known as: LASIX Take 10 mg by mouth daily as needed.   HYDROcodone-acetaminophen 10-325 MG tablet Commonly known as: Norco Take 1 tablet by mouth every 8 (eight) hours as needed for severe pain. Must last 30 days.   HYDROcodone-acetaminophen 10-325 MG tablet Commonly known as: Norco Take 1 tablet by mouth every 8 (eight) hours as needed for severe pain. Must last 30 days. Start taking on: May 04, 2023   hydrOXYzine 25  MG capsule Commonly known as: VISTARIL Take 25 mg by mouth daily as needed for anxiety.   lamoTRIgine 200 MG tablet Commonly known as: LAMICTAL Take 1 tablet (200 mg total) by mouth daily.   meclizine 25 MG tablet Commonly known as: ANTIVERT Take by mouth 2 (two) times daily as needed.   metFORMIN 500 MG tablet Commonly known as: GLUCOPHAGE Take 500 mg by mouth 2 (two) times daily with a meal.   ondansetron 8 MG tablet Commonly known as: ZOFRAN Take 1 tablet (8 mg total) by mouth every 8 (eight) hours as needed for nausea or vomiting.   Ozempic (1 MG/DOSE) 4 MG/3ML Sopn Generic drug: Semaglutide (1 MG/DOSE) Inject 1 mg into the skin once a week.   pantoprazole 40 MG tablet Commonly known as: PROTONIX Take 40 mg by mouth daily.   polyethylene glycol powder 17 GM/SCOOP powder Commonly known as: GLYCOLAX/MIRALAX SMARTSIG:17 Gram(s) By Mouth 1-2 Times Daily   rifaximin 550 MG Tabs tablet Commonly known as: XIFAXAN Take by mouth.   spironolactone 25 MG tablet Commonly known as: ALDACTONE Take 25 mg by mouth daily.   tizanidine 2 MG capsule Commonly known as: ZANAFLEX Take 2 mg by mouth 3 (three) times daily as needed for muscle spasms. Takes differently        Allergies:  Allergies  Allergen Reactions   Lithium Nausea And Vomiting and Other (See Comments)   Penicillins Hives    Resulted in hospitalization   Sulfa Antibiotics Hives    Resulted in hospitalization   Benzonatate Other (See Comments)    Family History: Family History  Problem Relation Age of Onset   Mental illness Father    Bipolar disorder Brother    Bipolar disorder Paternal Uncle    Bipolar disorder Paternal Uncle     Social History:  reports that she quit smoking about 12 years ago. Her smoking use included cigarettes. She has never used smokeless tobacco. She reports that she does not currently use alcohol. She reports that she does not currently use drugs.   Physical Exam: BP 112/61    Pulse 76   Ht 5' (1.524 m)   Wt 163 lb (73.9 kg)   BMI 31.83 kg/m   Constitutional:  Alert and oriented, No acute distress. HEENT: [place] AT Respiratory: Normal respiratory effort, no increased work of breathing. Psychiatric: Normal mood and affect.   Pertinent Imaging: MRI was personally reviewed and interpreted.  MRI Abdomen  EXAM: MRI ABDOMEN WITHOUT AND WITH CONTRAST   TECHNIQUE: Multiplanar multisequence MR imaging of the abdomen was performed both before and after the administration of intravenous contrast.   CONTRAST:  7.5 cc Vueway IV   COMPARISON:  02/09/2023 abdominal sonogram. 02/03/2023 chest CT. 01/06/2020 MRI abdomen.   FINDINGS: Lower chest: No acute abnormality at the lung bases. Chronic moderate posterior right lower mediastinal varices.   Hepatobiliary: Mildly atrophic liver with diffusely irregular liver surface compatible with cirrhosis. Patchy lace-like T2 hyperintensity throughout the liver parenchyma  compatible with confluent hepatic fibrosis. No hepatic steatosis. No discrete liver masses. Specifically, no arterial phase enhancing lesions. Cholecystectomy. Bile ducts are within normal post cholecystectomy limits. Common bile duct diameter 8 mm. No choledocholithiasis. No biliary masses, strictures or beading.   Pancreas: No pancreatic mass or duct dilation.  No pancreas divisum.   Spleen: Mild splenomegaly.  Craniocaudal splenic length 14.0 cm.   Adrenals/Urinary Tract: Normal adrenals. No hydronephrosis. Small complex cystic 1.2 x 1.2 cm posterior mid to lower right renal cortical mass (series 19/image 21) with mildly thickened enhancing internal septations, considered Bosniak category 3, new from 01/06/2020 MRI. Small mildly complex 1.0 cm anterior lower left renal cyst with heterogeneous mild T1 hyperintensity and no definite solid enhancement on the motion degraded subtraction sequences, favor Bosniak category 2. Additional tiny  subcentimeter simple and T1 hyperintense renal cortical lesions scattered in both kidneys without appreciable enhancement, compatible with tiny Bosniak category 1 and category 2 renal cysts.   Stomach/Bowel: Normal non-distended stomach. Visualized small and large bowel is normal caliber, with no bowel wall thickening.   Vascular/Lymphatic: Atherosclerotic nonaneurysmal abdominal aorta. Patent portal, hepatic, splenic and renal veins. No renal vein tumor thrombus. Chronic moderate perisplenic and retroperitoneal varices. Mild-to-moderate gastrohepatic ligament and porta hepatis lymphadenopathy is not substantially changed from 01/06/2020 MRI. Representative 1.8 cm porta hepatis node (series 11/image 38), previously 2.1 cm. Representative 1.2 cm gastrohepatic ligament node (series 11/image 49), previously 1.2 cm.   Other: No abdominal ascites or focal fluid collection.   Musculoskeletal: No aggressive appearing focal osseous lesions.   IMPRESSION: 1. Small complex cystic 1.2 cm posterior mid to lower right renal cortical mass with mildly thickened enhancing internal septations, considered Bosniak category 3, new from 01/06/2020 MRI, cannot exclude cystic renal cell carcinoma. No evidence of renal vein tumor thrombus. Urology consultation suggested. 2. Cirrhosis. No suspicious liver masses. 3. Mild splenomegaly. Chronic moderate perisplenic, retroperitoneal and low posterior mediastinal varices. No ascites. 4. Chronic mild-to-moderate gastrohepatic ligament and porta hepatis lymphadenopathy, not substantially changed since 01/06/2020 MRI, favoring reactive etiology.   These results will be called to the ordering clinician or representative by the Radiologist Assistant, and communication documented in the PACS or [employer].     Electronically Signed   By: Delbert Phenix M.D.   On: 03/30/2023 12:48   Assessment & Plan:    1. 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 We discussed Bosniak 3 cysts harbor a 50% chance of malignancy.  Based on size, surveillance is an option and we discussed metastasis of renal lesions <4 cm is extremely rare without growth (<5%) Percutaneous ablation with either cryoablation or RFA by interventional radiology was discussed as well as partial nephrectomy.  Her daughter-in-law was present and daughter by telephone and all agreed active surveillance as an initial management option.  Follow-up renal mass protocol MRI in 4 months with office visit.  I have reviewed the above documentation for accuracy and completeness, and I agree with the above.   Riki Altes, MD  [facility] [address] [place], [place] [address] [phone]

## 2023-05-04 ENCOUNTER — Encounter: Payer: Self-pay | Admitting: Internal Medicine

## 2023-05-17 ENCOUNTER — Encounter: Payer: Self-pay | Admitting: *Deleted

## 2023-05-18 ENCOUNTER — Ambulatory Visit: Payer: 59 | Admitting: Anesthesiology

## 2023-05-18 ENCOUNTER — Encounter: Payer: Self-pay | Admitting: *Deleted

## 2023-05-18 ENCOUNTER — Other Ambulatory Visit: Payer: Self-pay

## 2023-05-18 ENCOUNTER — Encounter
Admission: RE | Disposition: A | Discharge status: Home / Self Care | Payer: Self-pay | Source: Ambulatory Visit | Attending: Gastroenterology

## 2023-05-18 ENCOUNTER — Ambulatory Visit
Admission: RE | Admit: 2023-05-18 | Discharge: 2023-05-18 | Disposition: A | Discharge status: Home / Self Care | Payer: 59 | Source: Ambulatory Visit | Attending: Gastroenterology | Admitting: Gastroenterology

## 2023-05-18 DIAGNOSIS — K7460 Unspecified cirrhosis of liver: Secondary | ICD-10-CM | POA: Insufficient documentation

## 2023-05-18 DIAGNOSIS — Z9049 Acquired absence of other specified parts of digestive tract: Secondary | ICD-10-CM | POA: Insufficient documentation

## 2023-05-18 DIAGNOSIS — Z09 Encounter for follow-up examination after completed treatment for conditions other than malignant neoplasm: Secondary | ICD-10-CM | POA: Insufficient documentation

## 2023-05-18 DIAGNOSIS — E669 Obesity, unspecified: Secondary | ICD-10-CM | POA: Insufficient documentation

## 2023-05-18 DIAGNOSIS — K635 Polyp of colon: Secondary | ICD-10-CM | POA: Diagnosis not present

## 2023-05-18 DIAGNOSIS — K7581 Nonalcoholic steatohepatitis (NASH): Secondary | ICD-10-CM | POA: Insufficient documentation

## 2023-05-18 DIAGNOSIS — F419 Anxiety disorder, unspecified: Secondary | ICD-10-CM | POA: Insufficient documentation

## 2023-05-18 DIAGNOSIS — Z87891 Personal history of nicotine dependence: Secondary | ICD-10-CM | POA: Diagnosis not present

## 2023-05-18 DIAGNOSIS — E1122 Type 2 diabetes mellitus with diabetic chronic kidney disease: Secondary | ICD-10-CM | POA: Insufficient documentation

## 2023-05-18 DIAGNOSIS — K641 Second degree hemorrhoids: Secondary | ICD-10-CM | POA: Diagnosis not present

## 2023-05-18 DIAGNOSIS — N189 Chronic kidney disease, unspecified: Secondary | ICD-10-CM | POA: Insufficient documentation

## 2023-05-18 DIAGNOSIS — D124 Benign neoplasm of descending colon: Secondary | ICD-10-CM | POA: Insufficient documentation

## 2023-05-18 DIAGNOSIS — I129 Hypertensive chronic kidney disease with stage 1 through stage 4 chronic kidney disease, or unspecified chronic kidney disease: Secondary | ICD-10-CM | POA: Diagnosis not present

## 2023-05-18 DIAGNOSIS — Z6833 Body mass index (BMI) 33.0-33.9, adult: Secondary | ICD-10-CM | POA: Insufficient documentation

## 2023-05-18 DIAGNOSIS — F319 Bipolar disorder, unspecified: Secondary | ICD-10-CM | POA: Insufficient documentation

## 2023-05-18 DIAGNOSIS — Z90710 Acquired absence of both cervix and uterus: Secondary | ICD-10-CM | POA: Insufficient documentation

## 2023-05-18 DIAGNOSIS — Z7985 Long-term (current) use of injectable non-insulin antidiabetic drugs: Secondary | ICD-10-CM | POA: Diagnosis not present

## 2023-05-18 HISTORY — PX: POLYPECTOMY: SHX5525

## 2023-05-18 HISTORY — PX: COLONOSCOPY WITH PROPOFOL: SHX5780

## 2023-05-18 LAB — GLUCOSE, CAPILLARY: Glucose-Capillary: 266 mg/dL — ABNORMAL HIGH (ref 70–99)

## 2023-05-18 SURGERY — COLONOSCOPY WITH PROPOFOL
Anesthesia: General

## 2023-05-18 MED ORDER — PROPOFOL 10 MG/ML IV BOLUS
INTRAVENOUS | Status: DC | PRN
  Administered 2023-05-18: 80 mg via INTRAVENOUS
  Administered 2023-05-18: 30 mg via INTRAVENOUS

## 2023-05-18 MED ORDER — PROPOFOL 500 MG/50ML IV EMUL
INTRAVENOUS | Status: DC | PRN
  Administered 2023-05-18: 125 ug/kg/min via INTRAVENOUS

## 2023-05-18 MED ORDER — LIDOCAINE HCL (CARDIAC) PF 100 MG/5ML IV SOSY
PREFILLED_SYRINGE | INTRAVENOUS | Status: DC | PRN
  Administered 2023-05-18: 20 mg via INTRAVENOUS

## 2023-05-18 MED ORDER — PROPOFOL 1000 MG/100ML IV EMUL
INTRAVENOUS | Status: AC
  Filled 2023-05-18: qty 100

## 2023-05-18 MED ORDER — SODIUM CHLORIDE 0.9 % IV SOLN: INTRAVENOUS | Status: DC

## 2023-05-18 MED ORDER — LIDOCAINE HCL (PF) 2 % IJ SOLN
INTRAMUSCULAR | Status: AC
  Filled 2023-05-18: qty 5

## 2023-05-18 NOTE — Interval H&P Note (Signed)
History and Physical Interval Note:  05/18/2023 8:35 AM  Margaret [name]  has presented today for surgery, with the diagnosis of Hx of adenomatous polyp of colon.  The various methods of treatment have been discussed with the patient and family. After consideration of risks, benefits and other options for treatment, the patient has consented to  Procedure(s): COLONOSCOPY WITH PROPOFOL (N/A) as a surgical intervention.  The patient's history has been reviewed, patient examined, no change in status, stable for surgery.  I have reviewed the patient's chart and labs.  Questions were answered to the patient's satisfaction.     Regis Bill  Ok to proceed with colonoscopy

## 2023-05-18 NOTE — Anesthesia Postprocedure Evaluation (Signed)
Anesthesia Post Note  Patient: Margaret [name]  Procedure(s) Performed: COLONOSCOPY WITH PROPOFOL POLYPECTOMY  Patient location during evaluation: PACU Anesthesia Type: General Level of consciousness: awake and alert, oriented and patient cooperative Pain management: pain level controlled Vital Signs Assessment: post-procedure vital signs reviewed and stable Respiratory status: spontaneous breathing, nonlabored ventilation and respiratory function stable Cardiovascular status: blood pressure returned to baseline and stable Postop Assessment: adequate PO intake Anesthetic complications: no   No notable events documented.   Last Vitals:  Vitals:   05/18/23 0909 05/18/23 0919  BP: 112/67 (!) 133/58  Pulse: 71 73  Resp:    Temp:    SpO2: 100% 100%    Last Pain:  Vitals:   05/18/23 0919  TempSrc:   PainSc: 0-No pain                 Reed Breech

## 2023-05-18 NOTE — H&P (Signed)
Outpatient short stay form Pre-procedure 05/18/2023  Margaret Bill, MD  Primary Physician: Danella Penton, MD  Reason for visit:  Surveillance  History of present illness:    67 y/o lady with history of hypertension, MASLD cirrhosis, and Bipolar 1 here for colonoscopy for history of polyps. Last colonoscopy about 5 months ago with adenomas but poor prep. No blood thinners. History of cholecystectomy and hysterectomy. No family history of GI malignancies. Platelets > 100 and INR is 1.2.    Current Facility-Administered Medications:    0.9 %  sodium chloride infusion, , Intravenous, Continuous, Meah Jiron, Rossie Muskrat, MD, Last Rate: 20 mL/hr at 05/18/23 0820, New Bag at 05/18/23 0820  Medications Prior to Admission  Medication Sig Dispense Refill Last Dose   amLODipine (NORVASC) 2.5 MG tablet Take by mouth.   05/17/2023   ARIPiprazole (ABILIFY) 10 MG tablet TAKE ONE TABLET BY MOUTH ONE TIME DAILY 90 tablet 0 05/17/2023   benztropine (COGENTIN) 0.5 MG tablet Take 1 tablet (0.5 mg total) by mouth daily as needed for tremors. 15 tablet 1 05/17/2023   escitalopram (LEXAPRO) 10 MG tablet Take 1 tablet (10 mg total) by mouth daily. 90 tablet 1 05/17/2023   HYDROcodone-acetaminophen (NORCO) 10-325 MG tablet Take 1 tablet by mouth every 8 (eight) hours as needed for severe pain. Must last 30 days. 90 tablet 0 05/17/2023   lamoTRIgine (LAMICTAL) 200 MG tablet Take 1 tablet (200 mg total) by mouth daily. 90 tablet 0 05/17/2023   metFORMIN (GLUCOPHAGE) 500 MG tablet Take 500 mg by mouth 2 (two) times daily with a meal.   05/17/2023   OZEMPIC, 1 MG/DOSE, 4 MG/3ML SOPN Inject 1 mg into the skin once a week.   04/26/2023   pantoprazole (PROTONIX) 40 MG tablet Take 40 mg by mouth daily.   05/17/2023   rifaximin (XIFAXAN) 550 MG TABS tablet Take by mouth.   05/17/2023   spironolactone (ALDACTONE) 25 MG tablet Take 25 mg by mouth daily.   05/17/2023   tizanidine (ZANAFLEX) 2 MG capsule Take 2 mg by mouth 3 (three)  times daily as needed for muscle spasms. Takes differently   Past Month   alendronate (FOSAMAX) 35 MG tablet Take 35 mg by mouth every 7 (seven) days. Take with a full glass of water on an empty stomach.      furosemide (LASIX) 20 MG tablet Take 10 mg by mouth daily as needed.      hydrOXYzine (VISTARIL) 25 MG capsule Take 25 mg by mouth daily as needed for anxiety.      meclizine (ANTIVERT) 25 MG tablet Take by mouth 2 (two) times daily as needed.      ondansetron (ZOFRAN) 8 MG tablet Take 1 tablet (8 mg total) by mouth every 8 (eight) hours as needed for nausea or vomiting. 40 tablet 0    polyethylene glycol powder (GLYCOLAX/MIRALAX) 17 GM/SCOOP powder SMARTSIG:17 Gram(s) By Mouth 1-2 Times Daily        Allergies  Allergen Reactions   Lithium Nausea And Vomiting and Other (See Comments)   Penicillins Hives    Resulted in hospitalization   Sulfa Antibiotics Hives    Resulted in hospitalization   Benzonatate Other (See Comments)     Past Medical History:  Diagnosis Date   Bipolar 1 disorder (HCC)    Depression    Diabetes mellitus without complication (HCC)    Type 2   GERD (gastroesophageal reflux disease)    Hypertension    Liver cirrhosis secondary to  NASH (HCC)    Osteoporosis     Review of systems:  Otherwise negative.    Physical Exam  Gen: Alert, oriented. Appears stated age.  HEENT: [place]ERR[place]. Lungs: No respiratory distress CV: RRR Abd: soft, benign, no masses Ext: No edema    Planned procedures: Proceed with colonoscopy. The patient understands the nature of the planned procedure, indications, risks, alternatives and potential complications including but not limited to bleeding, infection, perforation, damage to internal organs and possible oversedation/side effects from anesthesia. The patient agrees and gives consent to proceed.  Please refer to procedure notes for findings, recommendations and patient disposition/instructions.     Margaret Bill,  MD [facility]

## 2023-05-18 NOTE — Transfer of Care (Signed)
Immediate Anesthesia Transfer of Care Note  Patient: Margaret [name]  Procedure(s) Performed: COLONOSCOPY WITH PROPOFOL POLYPECTOMY  Patient Location: Endoscopy Unit  Anesthesia Type:General  Level of Consciousness: drowsy  Airway & Oxygen Therapy: Patient Spontanous Breathing  Post-op Assessment: Report given to RN and Post -op Vital signs reviewed and stable  Post vital signs: Reviewed and stable  Last Vitals:  Vitals Value Taken Time  BP 125/42 05/18/23 0859  Temp    Pulse 69 05/18/23 0859  Resp 12   SpO2 99 % 05/18/23 0859  Vitals shown include unfiled device data.  Last Pain:  Vitals:   05/18/23 0808  TempSrc: Temporal  PainSc: 0-No pain         Complications: No notable events documented.

## 2023-05-18 NOTE — Anesthesia Preprocedure Evaluation (Addendum)
Anesthesia Evaluation    Airway Mallampati: III   Neck ROM: Full    Dental  (+) Partial Upper, Missing   Pulmonary former smoker (quit 2012)   Pulmonary exam normal breath sounds clear to auscultation       Cardiovascular hypertension, Normal cardiovascular exam+ Valvular Problems/Murmurs (moderate AS)  Rhythm:Regular Rate:Normal  Echo 12/15/22:  NORMAL LEFT VENTRICULAR SYSTOLIC FUNCTION  NORMAL RIGHT VENTRICULAR SYSTOLIC FUNCTION  TRIVIAL REGURGITATION NOTED  MODERATE VALVULAR STENOSIS  Moderate calcific AS    Neuro/Psych  PSYCHIATRIC DISORDERS Anxiety Depression Bipolar Disorder      GI/Hepatic negative GI ROS,,,(+) Cirrhosis  (NASH)        Endo/Other  diabetes, Type 2  Obesity   Renal/GU Renal disease (CKD)     Musculoskeletal  (+) Arthritis ,    Abdominal   Peds  Hematology  (+) Blood dyscrasia, anemia   Anesthesia Other Findings Last dose Ozempic 04/28/23.  Reproductive/Obstetrics                             Anesthesia Physical Anesthesia Plan  ASA: 3  Anesthesia Plan: General   Post-op Pain Management:    Induction: Intravenous  PONV Risk Score and Plan: 3 and Propofol infusion, TIVA and Treatment may vary due to age or medical condition  Airway Management Planned: Natural Airway  Additional Equipment:   Intra-op Plan:   Post-operative Plan:   Informed Consent: I have reviewed the patients History and Physical, chart, labs and discussed the procedure including the risks, benefits and alternatives for the proposed anesthesia with the patient or authorized representative who has indicated his/her understanding and acceptance.       Plan Discussed with: CRNA  Anesthesia Plan Comments: (LMA/GETA backup discussed.  Patient consented for risks of anesthesia including but not limited to:  - adverse reactions to medications - damage to eyes, teeth, lips or other oral  mucosa - nerve damage due to positioning  - sore throat or hoarseness - damage to heart, brain, nerves, lungs, other parts of body or loss of life  Informed patient about role of CRNA in peri- and intra-operative care.  Patient voiced understanding.)        Anesthesia Quick Evaluation

## 2023-05-18 NOTE — Op Note (Signed)
[facility] Patient Name: Margaret [name] Procedure Date: 05/18/2023 8:29 AM MRN: [id number] Account #: [id number] Date of Birth: [birth year] Admit Type: Outpatient Age: 67 Room: [facility] ENDO ROOM 1 Gender: Female Note Status: Finalized Instrument Name: Prentice Docker [id number] Procedure:             Colonoscopy Indications:           Surveillance: Personal history of adenomatous polyps,                         inadequate prep on last colonoscopy (less than 1 year                         ago) Providers:             Eather Colas MD, MD Medicines:             Monitored Anesthesia Care Complications:         No immediate complications. Estimated blood loss:                         Minimal. Procedure:             Pre-Anesthesia Assessment:                        - Prior to the procedure, a History and Physical was                         performed, and patient medications and allergies were                         reviewed. The patient is competent. The risks and                         benefits of the procedure and the sedation options and                         risks were discussed with the patient. All questions                         were answered and informed consent was obtained.                         Patient identification and proposed procedure were                         verified by the physician, the nurse, the                         anesthesiologist, the anesthetist and the technician                         in the endoscopy suite. Mental Status Examination:                         alert and oriented. Airway Examination: normal                         oropharyngeal airway and neck mobility. Respiratory  Examination: clear to auscultation. CV Examination:                         normal. Prophylactic Antibiotics: The patient does not                         require prophylactic antibiotics. Prior                          Anticoagulants: The patient has taken no anticoagulant                         or antiplatelet agents. ASA Grade Assessment: III - A                         patient with severe systemic disease. After reviewing                         the risks and benefits, the patient was deemed in                         satisfactory condition to undergo the procedure. The                         anesthesia plan was to use monitored anesthesia care                         (MAC). Immediately prior to administration of                         medications, the patient was re-assessed for adequacy                         to receive sedatives. The heart rate, respiratory                         rate, oxygen saturations, blood pressure, adequacy of                         pulmonary ventilation, and response to care were                         monitored throughout the procedure. The physical                         status of the patient was re-assessed after the                         procedure.                        After obtaining informed consent, the colonoscope was                         passed under direct vision. Throughout the procedure,                         the patient's blood pressure, pulse, and oxygen  saturations were monitored continuously. The                         Colonoscope was introduced through the anus and                         advanced to the the cecum, identified by appendiceal                         orifice and ileocecal valve. The colonoscopy was                         performed without difficulty. The patient tolerated                         the procedure well. The quality of the bowel                         preparation was good. The ileocecal valve, appendiceal                         orifice, and rectum were photographed. Findings:      The perianal and digital rectal examinations were normal.      Two sessile polyps were found in the descending  colon. The polyps were 1       to 4 mm in size. These polyps were removed with a cold snare. Resection       and retrieval were complete. Estimated blood loss was minimal.      Internal hemorrhoids were found during retroflexion. The hemorrhoids       were Grade II (internal hemorrhoids that prolapse but reduce       spontaneously).      The exam was otherwise without abnormality on direct and retroflexion       views. Impression:            - Two 1 to 4 mm polyps in the descending colon,                         removed with a cold snare. Resected and retrieved.                        - Internal hemorrhoids.                        - The examination was otherwise normal on direct and                         retroflexion views. Recommendation:        - Discharge patient to home.                        - Resume previous diet.                        - Continue present medications.                        - Await pathology results.                        -  Repeat colonoscopy in 3 - 5 years for surveillance.                        - Return to referring physician as previously                         scheduled. Procedure Code(s):     --- Professional ---                        [phone], Colonoscopy, flexible; with removal of                         tumor(s), polyp(s), or other lesion(s) by snare                         technique Diagnosis Code(s):     --- Professional ---                        Z86.010, Personal history of colonic polyps                        D12.4, Benign neoplasm of descending colon                        K64.1, Second degree hemorrhoids CPT copyright 2022 [employer]. All rights reserved. The codes documented in this report are preliminary and upon coder review may  be revised to meet current compliance requirements. Eather Colas MD, MD 05/18/2023 8:57:45 AM Number of Addenda: 0 Note Initiated On: 05/18/2023 8:29 AM Scope Withdrawal Time: 0 hours 5  minutes 25 seconds  Total Procedure Duration: 0 hours 11 minutes 20 seconds  Estimated Blood Loss:  Estimated blood loss was minimal.      [facility]

## 2023-05-19 ENCOUNTER — Encounter: Payer: Self-pay | Admitting: Gastroenterology

## 2023-05-19 ENCOUNTER — Other Ambulatory Visit (HOSPITAL_COMMUNITY): Payer: Self-pay | Admitting: Psychiatry

## 2023-05-19 ENCOUNTER — Telehealth: Payer: Self-pay

## 2023-05-19 DIAGNOSIS — F3176 Bipolar disorder, in full remission, most recent episode depressed: Secondary | ICD-10-CM

## 2023-05-19 MED ORDER — ARIPIPRAZOLE 10 MG PO TABS
10.0000 mg | ORAL_TABLET | Freq: Every day | ORAL | 0 refills | Status: DC
Start: 2023-05-19 — End: 2023-08-25

## 2023-05-19 NOTE — Telephone Encounter (Signed)
received fax that patient needs refills on aripiprazole. pt was last seen on 5-14 next appt 9-9

## 2023-05-19 NOTE — Telephone Encounter (Signed)
 sent 

## 2023-06-01 ENCOUNTER — Ambulatory Visit
Payer: 59 | Attending: Student in an Organized Health Care Education/Training Program | Admitting: Student in an Organized Health Care Education/Training Program

## 2023-06-01 ENCOUNTER — Encounter: Payer: Self-pay | Admitting: Student in an Organized Health Care Education/Training Program

## 2023-06-01 VITALS — BP 131/60 | HR 71 | Temp 97.9°F | Resp 18 | Ht 60.0 in | Wt 168.0 lb

## 2023-06-01 DIAGNOSIS — Z96642 Presence of left artificial hip joint: Secondary | ICD-10-CM | POA: Insufficient documentation

## 2023-06-01 DIAGNOSIS — Z79891 Long term (current) use of opiate analgesic: Secondary | ICD-10-CM | POA: Insufficient documentation

## 2023-06-01 DIAGNOSIS — M47816 Spondylosis without myelopathy or radiculopathy, lumbar region: Secondary | ICD-10-CM | POA: Insufficient documentation

## 2023-06-01 DIAGNOSIS — G8929 Other chronic pain: Secondary | ICD-10-CM | POA: Diagnosis present

## 2023-06-01 DIAGNOSIS — M19011 Primary osteoarthritis, right shoulder: Secondary | ICD-10-CM | POA: Diagnosis present

## 2023-06-01 DIAGNOSIS — M25511 Pain in right shoulder: Secondary | ICD-10-CM | POA: Insufficient documentation

## 2023-06-01 DIAGNOSIS — G894 Chronic pain syndrome: Secondary | ICD-10-CM | POA: Insufficient documentation

## 2023-06-01 DIAGNOSIS — M5416 Radiculopathy, lumbar region: Secondary | ICD-10-CM | POA: Diagnosis present

## 2023-06-01 MED ORDER — HYDROCODONE-ACETAMINOPHEN 10-325 MG PO TABS: 1.0000 | ORAL_TABLET | Freq: Three times a day (TID) | ORAL | 0 refills | Status: AC | PRN

## 2023-06-01 MED ORDER — HYDROCODONE-ACETAMINOPHEN 10-325 MG PO TABS: 1.0000 | ORAL_TABLET | Freq: Three times a day (TID) | ORAL | 0 refills | Status: DC | PRN

## 2023-06-01 NOTE — Progress Notes (Signed)
Nursing Pain Medication Assessment:  Safety precautions to be maintained throughout the outpatient stay will include: orient to surroundings, keep bed in low position, maintain call bell within reach at all times, provide assistance with transfer out of bed and ambulation.  Medication Inspection Compliance: Pill count conducted under aseptic conditions, in front of the patient. Neither the pills nor the bottle was removed from the patient's sight at any time. Once count was completed pills were immediately returned to the patient in their original bottle.  Medication: Hydrocodone/APAP Pill/Patch Count:  36 of 90 pills remain Pill/Patch Appearance: Markings consistent with prescribed medication Bottle Appearance: Standard pharmacy container. Clearly labeled. Filled Date: 07 / 03 / 2024 Last Medication intake:  TodaySafety precautions to be maintained throughout the outpatient stay will include: orient to surroundings, keep bed in low position, maintain call bell within reach at all times, provide assistance with transfer out of bed and ambulation.   Patient states she has additional tablets in her medication case.

## 2023-06-01 NOTE — Progress Notes (Signed)
PROVIDER NOTE: Information contained herein reflects review and annotations entered in association with encounter. Interpretation of such information and data should be left to medically-trained personnel. Information provided to patient can be located elsewhere in the medical record under "Patient Instructions". Document created using STT-dictation technology, any transcriptional errors that may result from process are unintentional.    Patient: Margaret [name]  Service Category: E/M  Provider: Edward Jolly, MD  DOB: 1956/06/19  DOS: 06/01/2023  Specialty: Interventional Pain Management  MRN: [id number]  Setting: Ambulatory outpatient  PCP: Danella Penton, MD  Type: Established Patient    Referring Provider: Danella Penton, MD  Location: Office  Delivery: Face-to-face     HPI  Margaret [name], a 67 y.o. year old female, is here today because of her Primary osteoarthritis of right shoulder [M19.011]. [name] primary complain today is Shoulder Pain (right) Last encounter: My last encounter with her was on 03/10/23 Pertinent problems: Margaret [name] has Chronic pain syndrome; Encounter for long-term opiate analgesic use; Bilateral primary osteoarthritis of knee; SI (sacroiliac) joint dysfunction; History of left hip replacement; Recurrent major depressive episodes, moderate (HCC); Cervical radicular pain; Pain management contract signed; Lumbar facet arthropathy; Pain in joint of right shoulder; and Cervical facet joint syndrome on their pertinent problem list.  Vitals:  height is 5' (1.524 m) and weight is 168 lb (76.2 kg). Her temporal temperature is 97.9 F (36.6 C). Her blood pressure is 131/60 and her pulse is 71. Her respiration is 18 and oxygen saturation is 98%.   Reason for encounter: both, medication management and post-procedure evaluation and assessment.   -Excellent relief after left intra-articular Hyalgan injection. -Patient is complaining of increased right shoulder pain that is worse  with shoulder abduction related to shoulder osteoarthritis.  Post-procedure evaluation  Type: Therapeutic Intra-Articular Hyalgan Knee Injection #3  Region: Medial infrapatellar Knee Region Level: Knee Joint Laterality: Left knee     Effectiveness:  Initial hour after procedure: 100 %  Subsequent 4-6 hours post-procedure: 100 %  Analgesia past initial 6 hours: 100 %  Ongoing improvement:  Analgesic:  100% Function: Somewhat improved    Pharmacotherapy Assessment  Analgesic: Norco 10 mg TID prn   Monitoring: [place] PMP: PDMP reviewed during this encounter.       Pharmacotherapy: No side-effects or adverse reactions reported. Compliance: No problems identified. Effectiveness: Clinically acceptable.  Concepcion Elk, RN  06/01/2023 10:46 AM  Sign when Signing Visit Nursing Pain Medication Assessment:  Safety precautions to be maintained throughout the outpatient stay will include: orient to surroundings, keep bed in low position, maintain call bell within reach at all times, provide assistance with transfer out of bed and ambulation.  Medication Inspection Compliance: Pill count conducted under aseptic conditions, in front of the patient. Neither the pills nor the bottle was removed from the patient's sight at any time. Once count was completed pills were immediately returned to the patient in their original bottle.  Medication: Hydrocodone/APAP Pill/Patch Count:  36 of 90 pills remain Pill/Patch Appearance: Markings consistent with prescribed medication Bottle Appearance: Standard pharmacy container. Clearly labeled. Filled Date: 07 / 03 / 2024 Last Medication intake:  TodaySafety precautions to be maintained throughout the outpatient stay will include: orient to surroundings, keep bed in low position, maintain call bell within reach at all times, provide assistance with transfer out of bed and ambulation.   Patient states she has additional tablets in her medication case.       UDS:  Summary  Date  Value Ref Range Status  05/26/2022 Note  Final    Comment:    ==================================================================== [employer] Select 13 (MW) ==================================================================== Test                             Result       Flag       Units  Drug Present and Declared for Prescription Verification   Hydrocodone                    4270         EXPECTED   ng/mg creat   Hydromorphone                  287          EXPECTED   ng/mg creat   Dihydrocodeine                 570          EXPECTED   ng/mg creat   Norhydrocodone                 3017         EXPECTED   ng/mg creat    Sources of hydrocodone include scheduled prescription medications.    Hydromorphone, dihydrocodeine and norhydrocodone are expected    metabolites of hydrocodone. Hydromorphone and dihydrocodeine are    also available as scheduled prescription medications.  ==================================================================== Test                      Result    Flag   Units      Ref Range   Creatinine              46               mg/dL      >=40 ==================================================================== Declared Medications:  The flagging and interpretation on this report are based on the  following declared medications.  Unexpected results may arise from  inaccuracies in the declared medications.   **Note: The testing scope of this panel includes these medications:   Hydrocodone (Norco)   **Note: The testing scope of this panel does not include the  following reported medications:   Acetaminophen (Tylenol)  Acetaminophen (Norco)  Alendronate (Fosamax)  Amlodipine (Norvasc)  Aripiprazole (Abilify)  Benztropine (Cogentin)  Escitalopram (Lexapro)  Furosemide (Lasix)  Glimepiride (Amaryl)  Hydroxyzine (Vistaril)  Insulin (Lantus)  Lactulose (Chronulac)  Lamotrigine (Lamictal)  Linaclotide (Linzess)  Meclizine (Antivert)   Metformin (Glucophage)  Pantoprazole (Protonix)  Semaglutide (Ozempic)  Spironolactone (Aldactone)  Tizanidine (Zanaflex)  Vitamin B  Vitamin C ==================================================================== For clinical consultation, please call [phone]. ====================================================================      ROS  Constitutional: Denies any fever or chills Gastrointestinal: No reported hemesis, hematochezia, vomiting, or acute GI distress Musculoskeletal:  Right shoulder, left hip pain Neurological: No reported episodes of acute onset apraxia, aphasia, dysarthria, agnosia, amnesia, paralysis, loss of coordination, or loss of consciousness  Medication Review  ARIPiprazole, HYDROcodone-acetaminophen, Semaglutide (1 MG/DOSE), alendronate, amLODipine, benztropine, escitalopram, furosemide, glimepiride, hydrOXYzine, lamoTRIgine, meclizine, metFORMIN, pantoprazole, polyethylene glycol powder, rifaximin, spironolactone, and tizanidine  History Review  Allergy: Ms. Stream is allergic to lithium, penicillins, sulfa antibiotics, and benzonatate. Drug: Ms. Robitaille  reports that she does not currently use drugs. Alcohol:  reports current alcohol use. Tobacco:  reports that she quit smoking about 12 years ago. Her smoking use included cigarettes. She has never used smokeless  tobacco. Social: Ms. Boas  reports that she quit smoking about 12 years ago. Her smoking use included cigarettes. She has never used smokeless tobacco. She reports current alcohol use. She reports that she does not currently use drugs. Medical:  has a past medical history of Bipolar 1 disorder (HCC), Depression, Diabetes mellitus without complication (HCC), GERD (gastroesophageal reflux disease), Hypertension, 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H (HCC), and Osteoporosis. Surgical: Ms. Hambric  has a past surgical history that includes Abdominal hysterectomy; Tonsillectomy; Cholecystectomy;  Esophagogastroduodenoscopy (egd) with propofol (N/A, 06/23/2019); Colonoscopy with propofol (N/A, 06/23/2019); Esophagogastroduodenoscopy (egd) with propofol (N/A, 12/08/2022); Colonoscopy with propofol (N/A, 12/08/2022); Joint replacement; Esophagogastroduodenoscopy (egd) with propofol (N/A, 01/12/2023); Colonoscopy with propofol (N/A, 05/18/2023); and polypectomy (05/18/2023). Family: family history includes Bipolar disorder in her brother, paternal uncle, and paternal uncle; Mental illness in her father.  Laboratory Chemistry Profile   Renal Lab Results  Component Value Date   BUN 18 04/05/2023   CREATININE 1.09 (H) 04/05/2023   BCR 19 12/06/2019   GFRAA 114 12/06/2019   GFRNONAA 56 (L) 04/05/2023    Hepatic Lab Results  Component Value Date   AST 35 04/05/2023   ALT 25 04/05/2023   ALBUMIN 3.8 04/05/2023   ALKPHOS 94 04/05/2023    Electrolytes Lab Results  Component Value Date   NA 135 04/05/2023   K 4.3 04/05/2023   CL 99 04/05/2023   CALCIUM 9.1 04/05/2023    Bone No results found for: "VD25OH", "VD125OH2TOT", "WU9811BJ4", "NW2956OZ3", "25OHVITD1", "25OHVITD2", "25OHVITD3", "TESTOFREE", "TESTOSTERONE"  Inflammation (CRP: Acute Phase) (ESR: Chronic Phase) No results found for: "CRP", "ESRSEDRATE", "LATICACIDVEN"       Note: Above Lab results reviewed.   Physical Exam  General appearance: Well nourished, well developed, and well hydrated. In no apparent acute distress Mental status: Alert, oriented x 3 (person, place, & time)       Respiratory: No evidence of acute respiratory distress Eyes: PERLA Vitals: BP 131/60   Pulse 71   Temp 97.9 F (36.6 C) (Temporal)   Resp 18   Ht 5' (1.524 m)   Wt 168 lb (76.2 kg)   SpO2 98%   BMI 32.81 kg/m  BMI: Estimated body mass index is 32.81 kg/m as calculated from the following:   Height as of this encounter: 5' (1.524 m).   Weight as of this encounter: 168 lb (76.2 kg). Ideal: Ideal body weight: 45.5 kg (100 lb 4.9  oz) Adjusted ideal body weight: 57.8 kg (127 lb 6.2 oz)  Upper Extremity (UE) Exam    Side: Right upper extremity  Side: Left upper extremity  Skin & Extremity Inspection: Skin color, temperature, and hair growth are WNL. No peripheral edema or cyanosis. No masses, redness, swelling, asymmetry, or associated skin lesions. No contractures.  Skin & Extremity Inspection: Skin color, temperature, and hair growth are WNL. No peripheral edema or cyanosis. No masses, redness, swelling, asymmetry, or associated skin lesions. No contractures.  Functional ROM: Pain restricted ROM for shoulder  Functional ROM: Unrestricted ROM          Muscle Tone/Strength: Functionally intact. No obvious neuro-muscular anomalies detected.  Muscle Tone/Strength: Functionally intact. No obvious neuro-muscular anomalies detected.  Sensory (Neurological): Arthropathic arthralgia          Sensory (Neurological): Unimpaired          Palpation: No palpable anomalies              Palpation: No palpable anomalies  Provocative Test(s):  Phalen's test: deferred Tinel's test: deferred Apley's scratch test (touch opposite shoulder):  Action 1 (Across chest): Decreased ROM Action 2 (Overhead): Decreased ROM Action 3 (LB reach): Decreased ROM   Provocative Test(s):  Phalen's test: deferred Tinel's test: deferred Apley's scratch test (touch opposite shoulder):  Action 1 (Across chest): deferred Action 2 (Overhead): deferred Action 3 (LB reach): deferred     Lumbar Spine Area Exam  Skin & Axial Inspection: No masses, redness, or swelling Alignment: Symmetrical Functional ROM: Pain restricted ROM       Stability: No instability detected Muscle Tone/Strength: Functionally intact. No obvious neuro-muscular anomalies detected. Sensory (Neurological): Musculoskeletal pain pattern  Lower Extremity Exam    Side: Right lower extremity  Side: Left lower extremity  Stability: No instability observed          Stability:  No instability observed          Skin & Extremity Inspection: Skin color, temperature, and hair growth are WNL. No peripheral edema or cyanosis. No masses, redness, swelling, asymmetry, or associated skin lesions. No contractures.  Skin & Extremity Inspection: left ankle swelling  Functional ROM: Unrestricted ROM                  Functional ROM: Pain restricted ROM  left knee          Muscle Tone/Strength: Functionally intact. No obvious neuro-muscular anomalies detected.  Muscle Tone/Strength: Functionally intact. No obvious neuro-muscular anomalies detected.  Sensory (Neurological): Unimpaired        Sensory (Neurological): Arthropathic arthralgia        DTR: Patellar: deferred today Achilles: deferred today Plantar: deferred today  DTR: Patellar: deferred today Achilles: deferred today Plantar: deferred today  Palpation: No palpable anomalies  Palpation: No palpable anomalies    Assessment   Status Diagnosis  Having a Flare-up Having a Flare-up Controlled 1. Primary osteoarthritis of right shoulder   2. Chronic right shoulder pain   3. Encounter for long-term opiate analgesic use   4. History of left hip replacement (2017)   5. Lumbar facet arthropathy   6. Chronic radicular lumbar pain   7. Chronic pain syndrome         Plan of Care    Ms. Sapir Sorell has a current medication list which includes the following long-term medication(s): aripiprazole, benztropine, escitalopram, furosemide, lamotrigine, metformin, pantoprazole, and spironolactone.  Pharmacotherapy (Medications Ordered): Meds ordered this encounter  Medications   HYDROcodone-acetaminophen (NORCO) 10-325 MG tablet    Sig: Take 1 tablet by mouth every 8 (eight) hours as needed for severe pain. Must last 30 days.    Dispense:  90 tablet    Refill:  0    Chronic Pain: STOP Act (Not applicable) Fill 1 day early if closed on refill date. Avoid benzodiazepines within 8 hours of opioids    HYDROcodone-acetaminophen (NORCO) 10-325 MG tablet    Sig: Take 1 tablet by mouth every 8 (eight) hours as needed for severe pain. Must last 30 days.    Dispense:  90 tablet    Refill:  0    Chronic Pain: STOP Act (Not applicable) Fill 1 day early if closed on refill date. Avoid benzodiazepines within 8 hours of opioids   HYDROcodone-acetaminophen (NORCO) 10-325 MG tablet    Sig: Take 1 tablet by mouth every 8 (eight) hours as needed for severe pain. Must last 30 days.    Dispense:  90 tablet    Refill:  0    Chronic Pain:  STOP Act (Not applicable) Fill 1 day early if closed on refill date. Avoid benzodiazepines within 8 hours of opioids   Encourage patient to continue with dieting and weight loss strategies.   Orders Placed This Encounter  Procedures   SHOULDER INJECTION    Standing Status:   Future    Standing Expiration Date:   09/01/2023    Scheduling Instructions:     Procedure: Intra-articular shoulder (Glenohumeral) joint injection     Side: RIGHT     Level: Glenohumeral joint               Sedation: without     Timeframe: As permitted by the schedule    Order Specific Question:   Where will this procedure be performed?    Answer:   [facility]   [employer] Select 13 (MW), Urine    Volume: 30 ml(s). Minimum 3 ml of urine is needed. Document temperature of fresh sample. Indications: Long term (current) use of opiate analgesic [phone])    Order Specific Question:   Release to patient    Answer:   Immediate      Follow-up plan:   Return in about 8 days (around 06/09/2023) for Right shoulder injection, in clinic [facility].    Recent Visits Date Type Provider Dept  03/10/23 Procedure visit Edward Jolly, MD [facility]  Showing recent visits within past 90 days and meeting all other requirements Today's Visits Date Type Provider Dept  06/01/23 Office Visit Edward Jolly, MD [facility]  Showing today's visits and meeting all other  requirements Future Appointments Date Type Provider Dept  06/09/23 Appointment Edward Jolly, MD [facility]  08/26/23 Appointment Edward Jolly, MD [facility]  Showing future appointments within next 90 days and meeting all other requirements  I discussed the assessment and treatment plan with the patient. The patient was provided an opportunity to ask questions and all were answered. The patient agreed with the plan and demonstrated an understanding of the instructions.  Patient advised to call back or seek an in-person evaluation if the symptoms or condition worsens.  Duration of encounter: .  Note by: Edward Jolly, MD Date: 06/01/2023; Time: 11:37 AM

## 2023-06-07 ENCOUNTER — Inpatient Hospital Stay: Payer: 59 | Admitting: Internal Medicine

## 2023-06-07 ENCOUNTER — Inpatient Hospital Stay: Payer: 59

## 2023-06-07 ENCOUNTER — Encounter: Payer: Self-pay | Admitting: Internal Medicine

## 2023-06-07 ENCOUNTER — Inpatient Hospital Stay: Payer: 59 | Attending: Internal Medicine

## 2023-06-07 VITALS — BP 136/47 | HR 84

## 2023-06-07 VITALS — BP 127/49 | HR 79 | Temp 98.3°F | Ht 60.0 in | Wt 166.1 lb

## 2023-06-07 DIAGNOSIS — D649 Anemia, unspecified: Secondary | ICD-10-CM

## 2023-06-07 DIAGNOSIS — D500 Iron deficiency anemia secondary to blood loss (chronic): Secondary | ICD-10-CM

## 2023-06-07 DIAGNOSIS — K922 Gastrointestinal hemorrhage, unspecified: Secondary | ICD-10-CM | POA: Insufficient documentation

## 2023-06-07 DIAGNOSIS — Z79899 Other long term (current) drug therapy: Secondary | ICD-10-CM | POA: Insufficient documentation

## 2023-06-07 DIAGNOSIS — K7460 Unspecified cirrhosis of liver: Secondary | ICD-10-CM | POA: Insufficient documentation

## 2023-06-07 DIAGNOSIS — D696 Thrombocytopenia, unspecified: Secondary | ICD-10-CM

## 2023-06-07 LAB — CMP (CANCER CENTER ONLY)
ALT: 20 U/L (ref 0–44)
AST: 29 U/L (ref 15–41)
Albumin: 4 g/dL (ref 3.5–5.0)
Alkaline Phosphatase: 79 U/L (ref 38–126)
Anion gap: 9 (ref 5–15)
BUN: 18 mg/dL (ref 8–23)
CO2: 23 mmol/L (ref 22–32)
Calcium: 9 mg/dL (ref 8.9–10.3)
Chloride: 101 mmol/L (ref 98–111)
Creatinine: 1.23 mg/dL — ABNORMAL HIGH (ref 0.44–1.00)
GFR, Estimated: 49 mL/min — ABNORMAL LOW (ref >60)
Glucose, Bld: 132 mg/dL — ABNORMAL HIGH (ref 70–99)
Potassium: 4.2 mmol/L (ref 3.5–5.1)
Sodium: 133 mmol/L — ABNORMAL LOW (ref 135–145)
Total Bilirubin: 0.8 mg/dL (ref 0.3–1.2)
Total Protein: 6.9 g/dL (ref 6.5–8.1)

## 2023-06-07 LAB — CBC WITH DIFFERENTIAL (CANCER CENTER ONLY)
Abs Immature Granulocytes: 0.02 10*3/uL (ref 0.00–0.07)
Basophils Absolute: 0 10*3/uL (ref 0.0–0.1)
Basophils Relative: 1 %
Eosinophils Absolute: 0.1 10*3/uL (ref 0.0–0.5)
Eosinophils Relative: 1 %
HCT: 25.9 % — ABNORMAL LOW (ref 36.0–46.0)
Hemoglobin: 8.1 g/dL — ABNORMAL LOW (ref 12.0–15.0)
Immature Granulocytes: 0 %
Lymphocytes Relative: 18 %
Lymphs Abs: 1 10*3/uL (ref 0.7–4.0)
MCH: 23.8 pg — ABNORMAL LOW (ref 26.0–34.0)
MCHC: 31.3 g/dL (ref 30.0–36.0)
MCV: 76 fL — ABNORMAL LOW (ref 80.0–100.0)
Monocytes Absolute: 0.5 10*3/uL (ref 0.1–1.0)
Monocytes Relative: 10 %
Neutro Abs: 3.7 10*3/uL (ref 1.7–7.7)
Neutrophils Relative %: 70 %
Platelet Count: 125 10*3/uL — ABNORMAL LOW (ref 150–400)
RBC: 3.41 MIL/uL — ABNORMAL LOW (ref 3.87–5.11)
RDW: 14.4 % (ref 11.5–15.5)
WBC Count: 5.3 10*3/uL (ref 4.0–10.5)
nRBC: 0 % (ref 0.0–0.2)

## 2023-06-07 LAB — IRON AND TIBC
Iron: 22 ug/dL — ABNORMAL LOW (ref 28–170)
Saturation Ratios: 4 % — ABNORMAL LOW (ref 10.4–31.8)
TIBC: 588 ug/dL — ABNORMAL HIGH (ref 250–450)
UIBC: 566 ug/dL

## 2023-06-07 LAB — FERRITIN: Ferritin: 6 ng/mL — ABNORMAL LOW (ref 11–307)

## 2023-06-07 MED ORDER — SODIUM CHLORIDE 0.9 % IV SOLN
Freq: Once | INTRAVENOUS | Status: AC
  Filled 2023-06-07: qty 250

## 2023-06-07 MED ORDER — SODIUM CHLORIDE 0.9 % IV SOLN
200.0000 mg | Freq: Once | INTRAVENOUS | Status: AC
  Administered 2023-06-07: 200 mg via INTRAVENOUS
  Filled 2023-06-07: qty 200

## 2023-06-07 NOTE — Assessment & Plan Note (Addendum)
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  FEB 2024- Iron sat 9 Ferritin 45- wnl-PCP; Hb 9-10.     s/p IV iron x4 [JAN 2024]- MM panel-WNL.   # Today Hb 8.1- 3 months ago- was 12; plan venofer weekly x 4  # Etiology: Cirrhosis/GI bleed FEB 2024- GAVE -non-bleeding-; colo-? Capsule study.  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  Hold off EGD for now-  Discussed with GI.   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 Stable.   #Liver cirrhosis, splenomegaly, small volume ascites.  Patient does not drink alcohol.? NAFLD-AFP. Patient will need screening ultrasounds [? [facility]]-US- 2024- March - cirrhosis;MAY 2024-MRI per GI - NO malignancy noted.  # Right kidney complex cyst [incidental MAY 2024- MRI]- awaiting urology follow up/surveillance.   # weight loss sec to oezmpic [40 pounds]- stable. Low concerns for malignancy at this time.   # DISPOSITION: # venofer today; # in 1 week venofer- # follow up in 2 weeks- APP; lab- cbc/hold tube; venofer- # follow up in 1 month- ; MD; labs- cbc/cmp; HOLD tube;  possible venofer Dr.B

## 2023-06-07 NOTE — Progress Notes (Signed)
Fatigue/weakness: yes Dyspena: no Light headedness: no Blood in stool: no  

## 2023-06-07 NOTE — Progress Notes (Signed)
[facility] CONSULT NOTE  Patient Care Team: Danella Penton, MD as PCP - General (Internal Medicine) Earna Coder, MD as Consulting Physician (Internal Medicine)  CHIEF COMPLAINTS/PURPOSE OF CONSULTATION: Anemia/thrombocytopenia   HEMATOLOGY HISTORY  06/21/2019-06/24/2019 due to severe anemia; hemoglobin of 5.7 upon admission; patient received 2 units of blood transfusion, 3 doses of IV iron. EGD and colonoscopy were performed during the hospitalization EGD and colonoscopy on 06/23/2019. [Dr.Anna; [facility]] EGD showed no abnormalities and the colonoscopy showed 3 sessile diminutive polyps which were resected completely.  Arteries of the duodenum were normal.  3 colon polyps resected were serrated polyp x2 and a tubular adenoma x1. No active source of bleeding was noted.     # Cirrhosis NAFLD- [? [facility]]  HISTORY OF PRESENTING ILLNESS: ambulating with rolling walker [arthritis].   Nevelyn Duffield 67 y.o.  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  No nausea or vomiting or abdominal pain.   Patient is complains of worsening fatigue. No Blood in stools or black colored stools.    Review of Systems  Constitutional:  Positive for malaise/fatigue. Negative for chills, diaphoresis, fever and weight loss.  HENT:  Negative for nosebleeds and sore throat.   Eyes:  Negative for double vision.  Respiratory:  Negative for cough, hemoptysis, sputum production and wheezing.   Cardiovascular:  Negative for chest pain, palpitations, orthopnea and leg swelling.  Gastrointestinal:  Negative for abdominal pain, blood in stool, constipation, diarrhea, heartburn, melena, nausea and vomiting.  Genitourinary:  Negative for dysuria, frequency and urgency.  Musculoskeletal:  Positive for back pain and joint pain.  Skin: Negative.  Negative for  itching and rash.  Neurological:  Negative for dizziness, tingling, focal weakness, weakness and headaches.  Endo/Heme/Allergies:  Does not bruise/bleed easily.  Psychiatric/Behavioral:  Negative for depression. The patient is not nervous/anxious and does not have insomnia.      MEDICAL HISTORY:  Past Medical History:  Diagnosis Date   Bipolar 1 disorder (HCC)    Depression    Diabetes mellitus without complication (HCC)    Type 2   GERD (gastroesophageal reflux disease)    Hypertension    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H (HCC)    Osteoporosis     SURGICAL HISTORY: Past Surgical History:  Procedure Laterality Date   ABDOMINAL HYSTERECTOMY     CHOLECYSTECTOMY     COLONOSCOPY WITH PROPOFOL N/A 06/23/2019   Procedure: COLONOSCOPY WITH PROPOFOL;  Surgeon: Wyline Mood, MD;  Location: [facility] ENDOSCOPY;  Service: Gastroenterology;  Laterality: N/A;   COLONOSCOPY WITH PROPOFOL N/A 12/08/2022   Procedure: COLONOSCOPY WITH PROPOFOL;  Surgeon: Regis Bill, MD;  Location: [facility] ENDOSCOPY;  Service: Endoscopy;  Laterality: N/A;   COLONOSCOPY WITH PROPOFOL N/A 05/18/2023   Procedure: COLONOSCOPY WITH PROPOFOL;  Surgeon: Regis Bill, MD;  Location: [facility] ENDOSCOPY;  Service: Endoscopy;  Laterality: N/A;   ESOPHAGOGASTRODUODENOSCOPY (EGD) WITH PROPOFOL N/A 06/23/2019   Procedure: ESOPHAGOGASTRODUODENOSCOPY (EGD) WITH PROPOFOL;  Surgeon: Wyline Mood, MD;  Location: [facility] ENDOSCOPY;  Service: Gastroenterology;  Laterality: N/A;   ESOPHAGOGASTRODUODENOSCOPY (EGD) WITH PROPOFOL N/A 12/08/2022   Procedure: ESOPHAGOGASTRODUODENOSCOPY (EGD) WITH PROPOFOL;  Surgeon: Regis Bill, MD;  Location: [facility] ENDOSCOPY;  Service: Endoscopy;  Laterality: N/A;  DM, OZEMPIC   ESOPHAGOGASTRODUODENOSCOPY (EGD) WITH PROPOFOL N/A 01/12/2023   Procedure: ESOPHAGOGASTRODUODENOSCOPY (EGD) WITH PROPOFOL;  Surgeon: Regis Bill, MD;  Location: [facility] ENDOSCOPY;  Service:  Endoscopy;  Laterality: N/A;  DM    JOINT REPLACEMENT     POLYPECTOMY  05/18/2023   Procedure: POLYPECTOMY;  Surgeon: Regis Bill, MD;  Location: [facility] ENDOSCOPY;  Service: Endoscopy;;   TONSILLECTOMY      SOCIAL HISTORY: Social History   Socioeconomic History   Marital status: Divorced    Spouse name: Not on file   Number of children: 2   Years of education: Not on file   Highest education level: Not on file  Occupational History   Not on file  Tobacco Use   Smoking status: Former    Current packs/day: 0.00    Types: Cigarettes    Quit date: 03/03/2011    Years since quitting: 12.2   Smokeless tobacco: Never  Vaping Use   Vaping status: Never Used  Substance and Sexual Activity   Alcohol use: Yes    Comment: rarely   Drug use: Not Currently   Sexual activity: Not Currently  Other Topics Concern   Not on file  Social History Narrative   Not on file   Social Determinants of Health   Financial Resource Strain: Low Risk  (05/02/2022)   Received from [facility]   Overall Financial Resource Strain (CARDIA)  Food Insecurity: No Food Insecurity (05/02/2022)   Received from [facility]   Hunger Vital Sign  Transportation Needs: No Transportation Needs (05/02/2022)   Received from [facility] - Transportation  Physical Activity: Inactive (06/16/2021)   Received from [facility]   Exercise Vital Sign  Stress: No Stress Concern Present (06/16/2021)   Received from [facility] - Occupational Stress Questionnaire  Social Connections: Socially Isolated (06/16/2021)   Received from [facility]   Social Connection and Isolation Panel [NHANES]  Intimate Partner Violence: Not on file    FAMILY HISTORY: Family History  Problem Relation Age of Onset   Mental illness Father    Bipolar disorder Brother    Bipolar disorder Paternal Uncle    Bipolar disorder Paternal Uncle     ALLERGIES:  is  allergic to lithium, penicillins, sulfa antibiotics, and benzonatate.  MEDICATIONS:  Current Outpatient Medications  Medication Sig Dispense Refill   alendronate (FOSAMAX) 35 MG tablet Take 35 mg by mouth every 7 (seven) days. Take with a full glass of water on an empty stomach.     amLODipine (NORVASC) 2.5 MG tablet Take by mouth.     ARIPiprazole (ABILIFY) 10 MG tablet Take 1 tablet (10 mg total) by mouth daily. 90 tablet 0   benztropine (COGENTIN) 0.5 MG tablet Take 1 tablet (0.5 mg total) by mouth daily as needed for tremors. 15 tablet 1   escitalopram (LEXAPRO) 10 MG tablet Take 1 tablet (10 mg total) by mouth daily. 90 tablet 1   furosemide (LASIX) 20 MG tablet Take 10 mg by mouth daily as needed.     glimepiride (AMARYL) 2 MG tablet Take 2 mg by mouth every morning.     HYDROcodone-acetaminophen (NORCO) 10-325 MG tablet Take 1 tablet by mouth every 8 (eight) hours as needed for severe pain. Must last 30 days. 90 tablet 0   [START ON 07/04/2023] HYDROcodone-acetaminophen (NORCO) 10-325 MG tablet Take 1 tablet by mouth every 8 (eight) hours as needed for severe pain. Must last 30 days. 90 tablet 0   [START ON 08/03/2023] HYDROcodone-acetaminophen (NORCO) 10-325 MG tablet Take 1 tablet by mouth every  8 (eight) hours as needed for severe pain. Must last 30 days. 90 tablet 0   hydrOXYzine (VISTARIL) 25 MG capsule Take 25 mg by mouth daily as needed for anxiety.     lamoTRIgine (LAMICTAL) 200 MG tablet Take 1 tablet (200 mg total) by mouth daily. 90 tablet 0   meclizine (ANTIVERT) 25 MG tablet Take by mouth 2 (two) times daily as needed.     metFORMIN (GLUCOPHAGE) 500 MG tablet Take 500 mg by mouth 2 (two) times daily with a meal.     OZEMPIC, 1 MG/DOSE, 4 MG/3ML SOPN Inject 1 mg into the skin once a week.     pantoprazole (PROTONIX) 40 MG tablet Take 40 mg by mouth daily.     polyethylene glycol powder (GLYCOLAX/MIRALAX) 17 GM/SCOOP powder SMARTSIG:17 Gram(s) By Mouth 1-2 Times Daily      spironolactone (ALDACTONE) 25 MG tablet Take 25 mg by mouth daily.     tizanidine (ZANAFLEX) 2 MG capsule Take 2 mg by mouth 3 (three) times daily as needed for muscle spasms. Takes differently     No current facility-administered medications for this visit.     PHYSICAL EXAMINATION:   Vitals:   06/07/23 1304  BP: (!) 127/49  Pulse: 79  Temp: 98.3 F (36.8 C)  SpO2: 99%    Filed Weights   06/07/23 1304  Weight: 166 lb 1.6 oz (75.3 kg)     Physical Exam Vitals and nursing note reviewed.  HENT:     Head: Normocephalic and atraumatic.     Mouth/Throat:     Pharynx: Oropharynx is clear.  Eyes:     Extraocular Movements: Extraocular movements intact.     Pupils: Pupils are equal, round, and reactive to light.  Cardiovascular:     Rate and Rhythm: Normal rate and regular rhythm.  Pulmonary:     Comments: Decreased breath sounds bilaterally.  Abdominal:     Palpations: Abdomen is soft.  Musculoskeletal:        General: Normal range of motion.     Cervical back: Normal range of motion.  Skin:    General: Skin is warm.  Neurological:     General: No focal deficit present.     Mental Status: She is alert and oriented to person, place, and time.  Psychiatric:        Behavior: Behavior normal.        Judgment: Judgment normal.      LABORATORY DATA:  I have reviewed the data as listed Lab Results  Component Value Date   WBC 5.3 06/07/2023   HGB 8.1 (L) 06/07/2023   HCT 25.9 (L) 06/07/2023   MCV 76.0 (L) 06/07/2023   PLT 125 (L) 06/07/2023   Recent Labs    03/05/23 1321 04/05/23 1034 06/07/23 1321  NA 135 135 133*  K 4.4 4.3 4.2  CL 100 99 101  CO2 22 29 23   GLUCOSE 204* 161* 132*  BUN 25* 18 18  CREATININE 1.15* 1.09* 1.23*  CALCIUM 9.8 9.1 9.0  GFRNONAA 53* 56* 49*  PROT 7.5 7.1 6.9  ALBUMIN 4.2 3.8 4.0  AST 31 35 29  ALT 30 25 20   ALKPHOS 85 94 79  BILITOT 1.1 1.1 0.8     No results found.  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  FEB 2024- Iron sat 9 Ferritin 45- wnl-PCP; Hb 9-10.  s/p IV iron x4 [JAN 2024]- MM panel-WNL.   # Today Hb 8.1- 3 months ago- was 12; plan venofer weekly x 4  # Etiology: Cirrhosis/GI bleed FEB 2024- GAVE -non-bleeding-; colo-? Capsule study.  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  Hold off EGD for now-  Discussed with GI.   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 Stable.   #Liver cirrhosis, splenomegaly, small volume ascites.  Patient does not drink alcohol.? NAFLD-AFP. Patient will need screening ultrasounds [? [facility]]-US- 2024- March - cirrhosis;MAY 2024-MRI per GI - NO malignancy noted.  # Right kidney complex cyst [incidental MAY 2024- MRI]- awaiting urology follow up/surveillance.   # weight loss sec to oezmpic [40 pounds]- stable. Low concerns for malignancy at this time.   # DISPOSITION: # venofer today; # in 1 week venofer- # follow up in 2 weeks- APP; lab- cbc/hold tube; venofer- # follow up in 1 month- ; MD; labs- cbc/cmp; HOLD tube;  possible venofer Dr.B   All questions were answered. The patient knows to call the clinic with any problems, questions or concerns.   Earna Coder, MD 06/07/2023 2:32 PM

## 2023-06-09 ENCOUNTER — Ambulatory Visit: Admission: RE | Admit: 2023-06-09 | Payer: 59 | Source: Ambulatory Visit

## 2023-06-09 ENCOUNTER — Encounter: Payer: Self-pay | Admitting: Student in an Organized Health Care Education/Training Program

## 2023-06-09 ENCOUNTER — Ambulatory Visit
Payer: 59 | Attending: Student in an Organized Health Care Education/Training Program | Admitting: Student in an Organized Health Care Education/Training Program

## 2023-06-09 VITALS — BP 154/78 | HR 70 | Temp 97.3°F | Resp 16 | Ht 60.0 in | Wt 166.0 lb

## 2023-06-09 DIAGNOSIS — M25511 Pain in right shoulder: Secondary | ICD-10-CM | POA: Diagnosis not present

## 2023-06-09 DIAGNOSIS — G894 Chronic pain syndrome: Secondary | ICD-10-CM | POA: Insufficient documentation

## 2023-06-09 DIAGNOSIS — M19011 Primary osteoarthritis, right shoulder: Secondary | ICD-10-CM | POA: Diagnosis present

## 2023-06-09 DIAGNOSIS — G8929 Other chronic pain: Secondary | ICD-10-CM | POA: Diagnosis not present

## 2023-06-09 MED ORDER — METHYLPREDNISOLONE ACETATE 40 MG/ML IJ SUSP
40.0000 mg | Freq: Once | INTRAMUSCULAR | Status: AC
  Administered 2023-06-09: 40 mg via INTRA_ARTICULAR
  Filled 2023-06-09: qty 1

## 2023-06-09 MED ORDER — ROPIVACAINE HCL 2 MG/ML IJ SOLN
4.0000 mL | Freq: Once | INTRAMUSCULAR | Status: AC
  Administered 2023-06-09: 20 mL via INTRA_ARTICULAR
  Filled 2023-06-09: qty 20

## 2023-06-09 MED ORDER — LIDOCAINE HCL 2 % IJ SOLN
20.0000 mL | Freq: Once | INTRAMUSCULAR | Status: AC
  Administered 2023-06-09: 400 mg
  Filled 2023-06-09: qty 20

## 2023-06-09 MED ORDER — IOHEXOL 180 MG/ML  SOLN
10.0000 mL | Freq: Once | INTRAMUSCULAR | Status: AC
  Administered 2023-06-09: 10 mL via INTRA_ARTICULAR
  Filled 2023-06-09: qty 20

## 2023-06-09 NOTE — Patient Instructions (Signed)

## 2023-06-09 NOTE — Progress Notes (Signed)
Safety precautions to be maintained throughout the outpatient stay will include: orient to surroundings, keep bed in low position, maintain call bell within reach at all times, provide assistance with transfer out of bed and ambulation.  

## 2023-06-09 NOTE — Progress Notes (Signed)
PROVIDER NOTE: Interpretation of information contained herein should be left to medically-trained personnel. Specific patient instructions are provided elsewhere under "Patient Instructions" section of medical record. This document was created in part using STT-dictation technology, any transcriptional errors that may result from this process are unintentional.  Patient: Margaret [name] Type: Established DOB: 29-May-1956 MRN: [id number] PCP: Danella Penton, MD  Service: Procedure DOS: 06/09/2023 Setting: Ambulatory Location: Ambulatory outpatient facility Delivery: Face-to-face Provider: Edward Jolly, MD Specialty: Interventional Pain Management Specialty designation: 09 Location: Outpatient facility Ref. Prov.: Danella Penton, MD       Interventional Therapy   Procedure: Glenohumeral Joint (shoulder) Injection #1  Laterality: Right (-RT)  Level: Shoulder   Imaging: Fluoroscopy-guided         Anesthesia: Local anesthesia (1-2% Lidocaine) DOS: 06/09/2023  Performed by: Edward Jolly, MD  Purpose: Diagnostic/Therapeutic Indications: Shoulder pain severe enough to impact quality of life or function. Rationale (medical necessity): procedure needed and proper for the diagnosis and/or treatment of Margaret [name]'s medical symptoms and needs. 1. Primary osteoarthritis of right shoulder   2. Chronic right shoulder pain   3. Chronic pain syndrome    NAS-11 Pain score:   Pre-procedure: 3 /10   Post-procedure: 0-No pain/10      Target: Glenohumeral Joint (shoulder) Location: Intra-articular  Region: Entire Shoulder Area Approach: Anterolateral approach. Type of procedure: Percutaneous joint injection   Position  Prep  Materials:  Position: Supine Prep solution: DuraPrep (Iodine Povacrylex [0.7% available iodine] and Isopropyl Alcohol, 74% w/w) Prep Area: Entire shoulder Area Materials:  Tray: Block Needle(s):  Type: Spinal  Gauge (G): 22  Length: 3.5-in  Qty: 1  H&P (Pre-op  Assessment):  Margaret [name] is a 67 y.o. (year old), female patient, seen today for interventional treatment. She  has a past surgical history that includes Abdominal hysterectomy; Tonsillectomy; Cholecystectomy; Esophagogastroduodenoscopy (egd) with propofol (N/A, 06/23/2019); Colonoscopy with propofol (N/A, 06/23/2019); Esophagogastroduodenoscopy (egd) with propofol (N/A, 12/08/2022); Colonoscopy with propofol (N/A, 12/08/2022); Joint replacement; Esophagogastroduodenoscopy (egd) with propofol (N/A, 01/12/2023); Colonoscopy with propofol (N/A, 05/18/2023); and polypectomy (05/18/2023). Margaret [name] has a current medication list which includes the following prescription(s): alendronate, amlodipine, aripiprazole, benztropine, escitalopram, furosemide, glimepiride, hydrocodone-acetaminophen, [START ON 07/04/2023] hydrocodone-acetaminophen, [START ON 08/03/2023] hydrocodone-acetaminophen, hydroxyzine, lamotrigine, meclizine, metformin, ozempic (1 mg/dose), pantoprazole, polyethylene glycol powder, spironolactone, and tizanidine. Her primarily concern today is the Shoulder Pain (right)  Initial Vital Signs:  Pulse/HCG Rate: 70ECG Heart Rate: 70 Temp: (!) 97.3 F (36.3 C) Resp: 16 BP: (!) 157/51 SpO2: 100 %  BMI: Estimated body mass index is 32.42 kg/m as calculated from the following:   Height as of this encounter: 5' (1.524 m).   Weight as of this encounter: 166 lb (75.3 kg).  Risk Assessment: Allergies: Reviewed. She is allergic to lithium, penicillins, sulfa antibiotics, and benzonatate.  Allergy Precautions: None required Coagulopathies: Reviewed. None identified.  Blood-thinner therapy: None at this time Active Infection(s): Reviewed. None identified. Margaret [name] is afebrile  Site Confirmation: Margaret [name] was asked to confirm the procedure and laterality before marking the site Procedure checklist: Completed Consent: Before the procedure and under the influence of no sedative(s), amnesic(s), or  anxiolytics, the patient was informed of the treatment options, risks and possible complications. To fulfill our ethical and legal obligations, as recommended by the American Medical Association's Code of Ethics, I have informed the patient of my clinical impression; the nature and purpose of the treatment or procedure; the risks, benefits, and possible complications of the intervention; the alternatives, including doing  nothing; the risk(s) and benefit(s) of the alternative treatment(s) or procedure(s); and the risk(s) and benefit(s) of doing nothing. The patient was provided information about the general risks and possible complications associated with the procedure. These may include, but are not limited to: failure to achieve desired goals, infection, bleeding, organ or nerve damage, allergic reactions, paralysis, and death. In addition, the patient was informed of those risks and complications associated to the procedure, such as failure to decrease pain; infection; bleeding; organ or nerve damage with subsequent damage to sensory, motor, and/or autonomic systems, resulting in permanent pain, numbness, and/or weakness of one or several areas of the body; allergic reactions; (i.e.: anaphylactic reaction); and/or death. Furthermore, the patient was informed of those risks and complications associated with the medications. These include, but are not limited to: allergic reactions (i.e.: anaphylactic or anaphylactoid reaction(s)); adrenal axis suppression; blood sugar elevation that in diabetics may result in ketoacidosis or comma; water retention that in patients with history of congestive heart failure may result in shortness of breath, pulmonary edema, and decompensation with resultant heart failure; weight gain; swelling or edema; medication-induced neural toxicity; particulate matter embolism and blood vessel occlusion with resultant organ, and/or nervous system infarction; and/or aseptic necrosis of one or  more joints. Finally, the patient was informed that Medicine is not an exact science; therefore, there is also the possibility of unforeseen or unpredictable risks and/or possible complications that may result in a catastrophic outcome. The patient indicated having understood very clearly. We have given the patient no guarantees and we have made no promises. Enough time was given to the patient to ask questions, all of which were answered to the patient's satisfaction. Ms. Minnig has indicated that she wanted to continue with the procedure. Attestation: I, the ordering provider, attest that I have discussed with the patient the benefits, risks, side-effects, alternatives, likelihood of achieving goals, and potential problems during recovery for the procedure that I have provided informed consent. Date  Time: 06/09/2023  1:22 PM   Imaging Guidance (Non-Spinal):          Type of Imaging Technique: Fluoroscopy Guidance (Non-Spinal) Indication(s): Assistance in needle guidance and placement for procedures requiring needle placement in or near specific anatomical locations not easily accessible without such assistance. Exposure Time: Please see nurses notes. Contrast: None used. Fluoroscopic Guidance: I was personally present during the use of fluoroscopy. "Tunnel Vision Technique" used to obtain the best possible view of the target area. Parallax error corrected before commencing the procedure. "Direction-depth-direction" technique used to introduce the needle under continuous pulsed fluoroscopy. Once target was reached, antero-posterior, oblique, and lateral fluoroscopic projection used confirm needle placement in all planes. Images permanently stored in EMR. Interpretation: No contrast injected. I personally interpreted the imaging intraoperatively. Adequate needle placement confirmed in multiple planes. Permanent images saved into the patient's record.  Pre-Procedure Preparation:  Monitoring: As per  clinic protocol. Respiration, ETCO2, SpO2, BP, heart rate and rhythm monitor placed and checked for adequate function Safety Precautions: Patient was assessed for positional comfort and pressure points before starting the procedure. Time-out: I initiated and conducted the "Time-out" before starting the procedure, as per protocol. The patient was asked to participate by confirming the accuracy of the "Time Out" information. Verification of the correct person, site, and procedure were performed and confirmed by me, the nursing staff, and the patient. "Time-out" conducted as per Joint Commission's Universal Protocol (UP.01.01.01). Time: 1346 Start Time: 1346 hrs.  Description  Narrative of Procedure:  Rationale (medical necessity): procedure needed and proper for the diagnosis and/or treatment of the patient's medical symptoms and needs. Procedural Technique Safety Precautions: Aspiration looking for blood return was conducted prior to all injections. At no point did we inject any substances, as a needle was being advanced. No attempts were made at seeking any paresthesias. Safe injection practices and needle disposal techniques used. Medications properly checked for expiration dates. SDV (single dose vial) medications used. Description of the Procedure: Protocol guidelines were followed. The patient was placed in position over the procedure table. The target area was identified and the area prepped in the usual manner. Skin & deeper tissues infiltrated with local anesthetic. Appropriate amount of time allowed to pass for local anesthetics to take effect. The procedure needles were then advanced to the target area. Proper needle placement secured. Negative aspiration confirmed. Solution injected in intermittent fashion, asking for systemic symptoms every 0.5cc of injectate. The needles were then removed and the area cleansed, making sure to leave some of the prepping solution back to take advantage of  its long term bactericidal properties.             Vitals:   06/09/23 1325 06/09/23 1348  BP: (!) 157/51 (!) 154/78  Pulse: 70   Resp: 16 16  Temp: (!) 97.3 F (36.3 C)   SpO2: 100% 98%  Weight: 166 lb (75.3 kg)   Height: 5' (1.524 m)      Start Time: 1346 hrs. End Time: 1349 hrs.   Post-operative Assessment:  Post-procedure Vital Signs:  Pulse/HCG Rate: 7070 Temp: (!) 97.3 F (36.3 C) Resp: 16 BP: (!) 154/78 SpO2: 98 %  EBL: None  Complications: No immediate post-treatment complications observed by team, or reported by patient.  Note: The patient tolerated the entire procedure well. A repeat set of vitals were taken after the procedure and the patient was kept under observation following institutional policy, for this type of procedure. Post-procedural neurological assessment was performed, showing return to baseline, prior to discharge. The patient was provided with post-procedure discharge instructions, including a section on how to identify potential problems. Should any problems arise concerning this procedure, the patient was given instructions to immediately contact us, at any time, without hesitation. In any case, we plan to contact the patient by telephone for a follow-up status report regarding this interventional procedure.  Comments:  No additional relevant information.  Plan of Care (POC)  Orders:  Orders Placed This Encounter  Procedures   DG PAIN CLINIC C-ARM 1-60 MIN NO REPORT    Intraoperative interpretation by procedural physician at [facility].    Standing Status:   Standing    Number of Occurrences:   1    Order Specific Question:   Reason for exam:    Answer:   Assistance in needle guidance and placement for procedures requiring needle placement in or near specific anatomical locations not easily accessible without such assistance.   Chronic Opioid Analgesic:  Norco 10 mg TID prn   Medications ordered for procedure: Meds ordered  this encounter  Medications   iohexol (OMNIPAQUE) 180 MG/ML injection 10 mL    Must be Myelogram-compatible. If not available, you may substitute with a water-soluble, non-ionic, hypoallergenic, myelogram-compatible radiological contrast medium.   lidocaine (XYLOCAINE) 2 % (with pres) injection 400 mg   methylPREDNISolone acetate (DEPO-MEDROL) injection 40 mg   ropivacaine (PF) 2 mg/mL (0.2%) (NAROPIN) injection 4 mL   Medications administered: We administered iohexol, lidocaine, methylPREDNISolone acetate, and ropivacaine (PF)  2 mg/mL (0.2%).  See the medical record for exact dosing, route, and time of administration.  Follow-up plan:   Return in about 4 weeks (around 07/07/2023) for PPE VV.       Interventional management options: Planned, scheduled, and/or pending:    Intra-articular knee Hyalgan series #1 done 08/28/20   Considering:   Lumbar epidural steroid injection, lumbar facet medial branch nerve block pending lumbar MRI   PRN Procedures:   None at this time       Recent Visits Date Type Provider Dept  06/01/23 Office Visit Edward Jolly, MD [facility]  Showing recent visits within past 90 days and meeting all other requirements Today's Visits Date Type Provider Dept  06/09/23 Procedure visit Edward Jolly, MD [facility]  Showing today's visits and meeting all other requirements Future Appointments Date Type Provider Dept  07/14/23 Appointment Edward Jolly, MD [facility]  08/26/23 Appointment Edward Jolly, MD [facility]  Showing future appointments within next 90 days and meeting all other requirements  Disposition: Discharge home  Discharge (Date  Time): 06/09/2023; 1355 hrs.   Primary Care Physician: Danella Penton, MD Location: [facility] Note by: Edward Jolly, MD (TTS technology used. I apologize for any typographical errors that were not detected and corrected.) Date: 06/09/2023;  Time: 2:02 PM  Disclaimer:  Medicine is not an Visual merchandiser. The only guarantee in medicine is that nothing is guaranteed. It is important to note that the decision to proceed with this intervention was based on the information collected from the patient. The Data and conclusions were drawn from the patient's questionnaire, the interview, and the physical examination. Because the information was provided in large part by the patient, it cannot be guaranteed that it has not been purposely or unconsciously manipulated. Every effort has been made to obtain as much relevant data as possible for this evaluation. It is important to note that the conclusions that lead to this procedure are derived in large part from the available data. Always take into account that the treatment will also be dependent on availability of resources and existing treatment guidelines, considered by other Pain Management Practitioners as being common knowledge and practice, at the time of the intervention. For Medico-Legal purposes, it is also important to point out that variation in procedural techniques and pharmacological choices are the acceptable norm. The indications, contraindications, technique, and results of the above procedure should only be interpreted and judged by a Board-Certified Interventional Pain Specialist with extensive familiarity and expertise in the same exact procedure and technique.

## 2023-06-10 ENCOUNTER — Telehealth: Payer: Self-pay

## 2023-06-10 NOTE — Telephone Encounter (Signed)
Post procedure follow up.  Patient states she is doing good.  

## 2023-06-14 ENCOUNTER — Inpatient Hospital Stay: Payer: 59

## 2023-06-14 VITALS — BP 128/47 | HR 68 | Temp 97.6°F | Resp 16

## 2023-06-14 DIAGNOSIS — D500 Iron deficiency anemia secondary to blood loss (chronic): Secondary | ICD-10-CM | POA: Diagnosis not present

## 2023-06-14 MED ORDER — SODIUM CHLORIDE 0.9 % IV SOLN
200.0000 mg | Freq: Once | INTRAVENOUS | Status: AC
  Administered 2023-06-14: 200 mg via INTRAVENOUS
  Filled 2023-06-14: qty 200

## 2023-06-14 MED ORDER — SODIUM CHLORIDE 0.9 % IV SOLN
Freq: Once | INTRAVENOUS | Status: AC
  Filled 2023-06-14: qty 250

## 2023-06-14 NOTE — Patient Instructions (Signed)
 Iron Sucrose Injection What is this medication? IRON SUCROSE (EYE ern SOO krose) treats low levels of iron (iron deficiency anemia) in people with kidney disease. Iron is a mineral that plays an important role in making red blood cells, which carry oxygen from your lungs to the rest of your body. This medicine may be used for other purposes; ask your health care provider or pharmacist if you have questions. COMMON BRAND NAME(S): Venofer What should I tell my care team before I take this medication? They need to know if you have any of these conditions: Anemia not caused by low iron levels Heart disease High levels of iron in the blood Kidney disease Liver disease An unusual or allergic reaction to iron, other medications, foods, dyes, or preservatives Pregnant or trying to get pregnant Breastfeeding How should I use this medication? This medication is for infusion into a vein. It is given in a hospital or clinic setting. Talk to your care team about the use of this medication in children. While this medication may be prescribed for children as young as 2 years for selected conditions, precautions do apply. Overdosage: If you think you have taken too much of this medicine contact a poison control center or emergency room at once. NOTE: This medicine is only for you. Do not share this medicine with others. What if I miss a dose? Keep appointments for follow-up doses. It is important not to miss your dose. Call your care team if you are unable to keep an appointment. What may interact with this medication? Do not take this medication with any of the following: Deferoxamine Dimercaprol Other iron products This medication may also interact with the following: Chloramphenicol Deferasirox This list may not describe all possible interactions. Give your health care provider a list of all the medicines, herbs, non-prescription drugs, or dietary supplements you use. Also tell them if you smoke,  drink alcohol, or use illegal drugs. Some items may interact with your medicine. What should I watch for while using this medication? Visit your care team regularly. Tell your care team if your symptoms do not start to get better or if they get worse. You may need blood work done while you are taking this medication. You may need to follow a special diet. Talk to your care team. Foods that contain iron include: whole grains/cereals, dried fruits, beans, or peas, leafy green vegetables, and organ meats (liver, kidney). What side effects may I notice from receiving this medication? Side effects that you should report to your care team as soon as possible: Allergic reactions--skin rash, itching, hives, swelling of the face, lips, tongue, or throat Low blood pressure--dizziness, feeling faint or lightheaded, blurry vision Shortness of breath Side effects that usually do not require medical attention (report to your care team if they continue or are bothersome): Flushing Headache Joint pain Muscle pain Nausea Pain, redness, or irritation at injection site This list may not describe all possible side effects. Call your doctor for medical advice about side effects. You may report side effects to FDA at 1-800-FDA-1088. Where should I keep my medication? This medication is given in a hospital or clinic. It will not be stored at home. NOTE: This sheet is a summary. It may not cover all possible information. If you have questions about this medicine, talk to your doctor, pharmacist, or health care provider.  2024 Elsevier/Gold Standard (2023-03-26 00:00:00)

## 2023-06-14 NOTE — Progress Notes (Signed)
Declined 30 minute post observation. Aware of risks. Vitals stable at discharge.  

## 2023-06-15 ENCOUNTER — Encounter: Payer: Self-pay | Admitting: Internal Medicine

## 2023-06-21 ENCOUNTER — Inpatient Hospital Stay: Payer: 59

## 2023-06-21 ENCOUNTER — Inpatient Hospital Stay (HOSPITAL_BASED_OUTPATIENT_CLINIC_OR_DEPARTMENT_OTHER): Payer: 59 | Admitting: Nurse Practitioner

## 2023-06-21 ENCOUNTER — Encounter: Payer: Self-pay | Admitting: Nurse Practitioner

## 2023-06-21 VITALS — BP 132/48 | HR 71

## 2023-06-21 VITALS — BP 132/43 | HR 72 | Temp 98.7°F | Resp 19 | Wt 165.3 lb

## 2023-06-21 DIAGNOSIS — D500 Iron deficiency anemia secondary to blood loss (chronic): Secondary | ICD-10-CM

## 2023-06-21 DIAGNOSIS — D649 Anemia, unspecified: Secondary | ICD-10-CM

## 2023-06-21 LAB — CBC WITH DIFFERENTIAL (CANCER CENTER ONLY)
Abs Immature Granulocytes: 0.04 10*3/uL (ref 0.00–0.07)
Basophils Absolute: 0 10*3/uL (ref 0.0–0.1)
Basophils Relative: 1 %
Eosinophils Absolute: 0.1 10*3/uL (ref 0.0–0.5)
Eosinophils Relative: 1 %
HCT: 29.2 % — ABNORMAL LOW (ref 36.0–46.0)
Hemoglobin: 8.9 g/dL — ABNORMAL LOW (ref 12.0–15.0)
Immature Granulocytes: 1 %
Lymphocytes Relative: 14 %
Lymphs Abs: 0.8 10*3/uL (ref 0.7–4.0)
MCH: 24.1 pg — ABNORMAL LOW (ref 26.0–34.0)
MCHC: 30.5 g/dL (ref 30.0–36.0)
MCV: 79.1 fL — ABNORMAL LOW (ref 80.0–100.0)
Monocytes Absolute: 0.6 10*3/uL (ref 0.1–1.0)
Monocytes Relative: 12 %
Neutro Abs: 3.9 10*3/uL (ref 1.7–7.7)
Neutrophils Relative %: 71 %
Platelet Count: 109 10*3/uL — ABNORMAL LOW (ref 150–400)
RBC: 3.69 MIL/uL — ABNORMAL LOW (ref 3.87–5.11)
RDW: 17.7 % — ABNORMAL HIGH (ref 11.5–15.5)
WBC Count: 5.4 10*3/uL (ref 4.0–10.5)
nRBC: 0 % (ref 0.0–0.2)

## 2023-06-21 LAB — SAMPLE TO BLOOD BANK

## 2023-06-21 MED ORDER — SODIUM CHLORIDE 0.9 % IV SOLN
200.0000 mg | Freq: Once | INTRAVENOUS | Status: AC
  Administered 2023-06-21: 200 mg via INTRAVENOUS
  Filled 2023-06-21: qty 200

## 2023-06-21 MED ORDER — SODIUM CHLORIDE 0.9 % IV SOLN
Freq: Once | INTRAVENOUS | Status: AC
  Filled 2023-06-21: qty 250

## 2023-06-21 NOTE — Progress Notes (Signed)
[facility] CONSULT NOTE  Patient Care Team: Danella Penton, MD as PCP - General (Internal Medicine) Earna Coder, MD as Consulting Physician (Internal Medicine)  CHIEF COMPLAINTS/PURPOSE OF CONSULTATION: Anemia/thrombocytopenia   HEMATOLOGY HISTORY  06/21/2019-06/24/2019 due to severe anemia; hemoglobin of 5.7 upon admission; patient received 2 units of blood transfusion, 3 doses of IV iron. EGD and colonoscopy were performed during the hospitalization EGD and colonoscopy on 06/23/2019. [Dr.Anna; [facility]] EGD showed no abnormalities and the colonoscopy showed 3 sessile diminutive polyps which were resected completely.  Arteries of the duodenum were normal.  3 colon polyps resected were serrated polyp x2 and a tubular adenoma x1. No active source of bleeding was noted.    # Cirrhosis NAFLD- [? [facility]]  HISTORY OF PRESENTING ILLNESS: ambulating with rolling walker [arthritis].   Margaret [name] 67 y.o.  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 Fatigue improves slightly after her iron infusions then declines. She continues to deny black or bloody stools. No abdominal pain, nausea, or vomiting.    Review of Systems  Constitutional:  Positive for malaise/fatigue. Negative for chills, diaphoresis, fever and weight loss.  HENT:  Negative for nosebleeds and sore throat.   Eyes:  Negative for double vision.  Respiratory:  Negative for cough, hemoptysis, sputum production and wheezing.   Cardiovascular:  Negative for chest pain, palpitations, orthopnea and leg swelling.  Gastrointestinal:  Negative for abdominal pain, blood in stool, constipation, diarrhea, heartburn, melena, nausea and vomiting.  Genitourinary:  Negative for dysuria, frequency and urgency.  Musculoskeletal:  Positive for back pain and joint pain.  Skin: Negative.   Negative for itching and rash.  Neurological:  Negative for dizziness, tingling, focal weakness, weakness and headaches.  Endo/Heme/Allergies:  Does not bruise/bleed easily.  Psychiatric/Behavioral:  Negative for depression. The patient is not nervous/anxious and does not have insomnia.    MEDICAL HISTORY:  Past Medical History:  Diagnosis Date   Bipolar 1 disorder (HCC)    Depression    Diabetes mellitus without complication (HCC)    Type 2   GERD (gastroesophageal reflux disease)    Hypertension    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H (HCC)    Osteoporosis    SURGICAL HISTORY: Past Surgical History:  Procedure Laterality Date   ABDOMINAL HYSTERECTOMY     CHOLECYSTECTOMY     COLONOSCOPY WITH PROPOFOL N/A 06/23/2019   Procedure: COLONOSCOPY WITH PROPOFOL;  Surgeon: Wyline Mood, MD;  Location: [facility] ENDOSCOPY;  Service: Gastroenterology;  Laterality: N/A;   COLONOSCOPY WITH PROPOFOL N/A 12/08/2022   Procedure: COLONOSCOPY WITH PROPOFOL;  Surgeon: Regis Bill, MD;  Location: [facility] ENDOSCOPY;  Service: Endoscopy;  Laterality: N/A;   COLONOSCOPY WITH PROPOFOL N/A 05/18/2023   Procedure: COLONOSCOPY WITH PROPOFOL;  Surgeon: Regis Bill, MD;  Location: [facility] ENDOSCOPY;  Service: Endoscopy;  Laterality: N/A;   ESOPHAGOGASTRODUODENOSCOPY (EGD) WITH PROPOFOL N/A 06/23/2019   Procedure: ESOPHAGOGASTRODUODENOSCOPY (EGD) WITH PROPOFOL;  Surgeon: Wyline Mood, MD;  Location: [facility] ENDOSCOPY;  Service: Gastroenterology;  Laterality: N/A;   ESOPHAGOGASTRODUODENOSCOPY (EGD) WITH PROPOFOL N/A 12/08/2022   Procedure: ESOPHAGOGASTRODUODENOSCOPY (EGD) WITH PROPOFOL;  Surgeon: Regis Bill, MD;  Location: [facility] ENDOSCOPY;  Service: Endoscopy;  Laterality: N/A;  DM, OZEMPIC   ESOPHAGOGASTRODUODENOSCOPY (EGD) WITH PROPOFOL N/A 01/12/2023   Procedure: ESOPHAGOGASTRODUODENOSCOPY (EGD) WITH PROPOFOL;  Surgeon: Regis Bill, MD;  Location: [facility] ENDOSCOPY;  Service: Endoscopy;  Laterality:  N/A;  DM   JOINT REPLACEMENT     POLYPECTOMY  05/18/2023   Procedure: POLYPECTOMY;  Surgeon: Regis Bill, MD;  Location: [facility] ENDOSCOPY;  Service: Endoscopy;;   TONSILLECTOMY     SOCIAL HISTORY: Social History   Socioeconomic History   Marital status: Divorced    Spouse name: Not on file   Number of children: 2   Years of education: Not on file   Highest education level: Not on file  Occupational History   Not on file  Tobacco Use   Smoking status: Former    Current packs/day: 0.00    Types: Cigarettes    Quit date: 03/03/2011    Years since quitting: 12.3   Smokeless tobacco: Never  Vaping Use   Vaping status: Never Used  Substance and Sexual Activity   Alcohol use: Yes    Comment: rarely   Drug use: Not Currently   Sexual activity: Not Currently  Other Topics Concern   Not on file  Social History Narrative   Not on file   Social Determinants of Health   Financial Resource Strain: Low Risk  (05/02/2022)   Received from [facility]   Overall Financial Resource Strain (CARDIA)  Food Insecurity: No Food Insecurity (05/02/2022)   Received from [facility]   Hunger Vital Sign  Transportation Needs: No Transportation Needs (05/02/2022)   Received from [facility] - Transportation  Physical Activity: Inactive (06/16/2021)   Received from [facility]   Exercise Vital Sign  Stress: No Stress Concern Present (06/16/2021)   Received from [facility] of Occupational Health - Occupational Stress Questionnaire  Social Connections: Socially Isolated (06/16/2021)   Received from [facility]   Social Connection and Isolation Panel [NHANES]  Intimate Partner Violence: Not on file   FAMILY HISTORY: Family History  Problem Relation Age of Onset   Mental illness Father    Bipolar disorder Brother    Bipolar disorder Paternal Uncle    Bipolar disorder Paternal Uncle    ALLERGIES:   is allergic to lithium, penicillins, sulfa antibiotics, and benzonatate.  MEDICATIONS:  Current Outpatient Medications  Medication Sig Dispense Refill   alendronate (FOSAMAX) 35 MG tablet Take 35 mg by mouth every 7 (seven) days. Take with a full glass of water on an empty stomach.     amLODipine (NORVASC) 2.5 MG tablet Take by mouth.     ARIPiprazole (ABILIFY) 10 MG tablet Take 1 tablet (10 mg total) by mouth daily. 90 tablet 0   benztropine (COGENTIN) 0.5 MG tablet Take 1 tablet (0.5 mg total) by mouth daily as needed for tremors. 15 tablet 1   escitalopram (LEXAPRO) 10 MG tablet Take 1 tablet (10 mg total) by mouth daily. 90 tablet 1   furosemide (LASIX) 20 MG tablet Take 10 mg by mouth daily as needed.     glimepiride (AMARYL) 2 MG tablet Take 2 mg by mouth every morning.     HYDROcodone-acetaminophen (NORCO) 10-325 MG tablet Take 1 tablet by mouth every 8 (eight) hours as needed for severe pain. Must last 30 days. 90 tablet 0   [START ON 07/04/2023] HYDROcodone-acetaminophen (NORCO) 10-325 MG tablet Take 1 tablet by mouth every 8 (eight) hours as needed for severe pain. Must last 30 days. 90 tablet 0   [START ON 08/03/2023] HYDROcodone-acetaminophen (NORCO) 10-325 MG tablet Take 1 tablet by mouth every 8 (eight) hours as needed for severe  pain. Must last 30 days. 90 tablet 0   hydrOXYzine (VISTARIL) 25 MG capsule Take 25 mg by mouth daily as needed for anxiety.     lamoTRIgine (LAMICTAL) 200 MG tablet Take 1 tablet (200 mg total) by mouth daily. 90 tablet 0   meclizine (ANTIVERT) 25 MG tablet Take by mouth 2 (two) times daily as needed.     metFORMIN (GLUCOPHAGE) 500 MG tablet Take 500 mg by mouth 2 (two) times daily with a meal.     OZEMPIC, 1 MG/DOSE, 4 MG/3ML SOPN Inject 1 mg into the skin once a week.     pantoprazole (PROTONIX) 40 MG tablet Take 40 mg by mouth daily.     polyethylene glycol powder (GLYCOLAX/MIRALAX) 17 GM/SCOOP powder SMARTSIG:17 Gram(s) By Mouth 1-2 Times Daily      spironolactone (ALDACTONE) 25 MG tablet Take 25 mg by mouth daily.     tizanidine (ZANAFLEX) 2 MG capsule Take 2 mg by mouth 3 (three) times daily as needed for muscle spasms. Takes differently     No current facility-administered medications for this visit.     PHYSICAL EXAMINATION: Vitals:   06/21/23 1428  BP: (!) 132/43  Pulse: 72  Resp: 19  Temp: 98.7 F (37.1 C)  SpO2: 97%   Filed Weights   06/21/23 1428  Weight: 165 lb 4.8 oz (75 kg)   Physical Exam Vitals reviewed.  Constitutional:      Appearance: She is not ill-appearing.  Eyes:     General: No scleral icterus. Cardiovascular:     Rate and Rhythm: Normal rate and regular rhythm.  Pulmonary:     Effort: No respiratory distress.  Abdominal:     General: There is no distension.     Palpations: Abdomen is soft.     Tenderness: There is no abdominal tenderness. There is no guarding.  Skin:    General: Skin is warm and dry.  Neurological:     Mental Status: She is alert and oriented to person, place, and time.  Psychiatric:        Mood and Affect: Mood normal.        Behavior: Behavior normal.      LABORATORY DATA:  I have reviewed the data as listed Lab Results  Component Value Date   WBC 5.4 06/21/2023   HGB 8.9 (L) 06/21/2023   HCT 29.2 (L) 06/21/2023   MCV 79.1 (L) 06/21/2023   PLT 109 (L) 06/21/2023   Recent Labs    03/05/23 1321 04/05/23 1034 06/07/23 1321  NA 135 135 133*  K 4.4 4.3 4.2  CL 100 99 101  CO2 22 29 23   GLUCOSE 204* 161* 132*  BUN 25* 18 18  CREATININE 1.15* 1.09* 1.23*  CALCIUM 9.8 9.1 9.0  GFRNONAA 53* 56* 49*  PROT 7.5 7.1 6.9  ALBUMIN 4.2 3.8 4.0  AST 31 35 29  ALT 30 25 20   ALKPHOS 85 94 79  BILITOT 1.1 1.1 0.8     DG PAIN CLINIC C-ARM 1-60 MIN NO REPORT  Result Date: 06/09/2023 Fluoro was used, but no Radiologist interpretation will be provided. Please refer to "NOTES" tab for provider progress note.   ASSESSMENT & PLAN:   Symptomatic anemia # [2020]  History of iron deficiency anemia secondary to chronic GI bleed/cirrhosis; GAVE [EGD in G+FEB 2024].  FEB 2024- Iron sat 9 Ferritin 45- wnl-PCP; Hb 9-10.     s/p IV iron x4 [JAN 2024]- MM panel-WNL.    # Hemoglobin was 12  in May. Dropped to 8.1 in early August. She is s/p venofer x 2. Hmg today is 8.9. Proceed with venofer today. Add Venofer weekly for next 2 weeks.    # Etiology: Cirrhosis/GI bleed FEB 2024- GAVE -non-bleeding-; colo-? Capsule study.  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  Hold off EGD for now.    # DISPOSITION: # venofer today # venofer weekly for next 2 weeks Follow up as scheduled with Dr Donneta Romberg- la  No problem-specific Assessment & Plan notes found for this encounter.  All questions were answered. The patient knows to call the clinic with any problems, questions or concerns.   Alinda Dooms, NP 06/21/2023

## 2023-06-28 ENCOUNTER — Inpatient Hospital Stay: Payer: 59

## 2023-06-28 VITALS — BP 109/53 | HR 71 | Temp 98.0°F | Resp 18

## 2023-06-28 DIAGNOSIS — D500 Iron deficiency anemia secondary to blood loss (chronic): Secondary | ICD-10-CM

## 2023-06-28 MED ORDER — SODIUM CHLORIDE 0.9 % IV SOLN
200.0000 mg | Freq: Once | INTRAVENOUS | Status: AC
  Administered 2023-06-28: 200 mg via INTRAVENOUS
  Filled 2023-06-28: qty 200

## 2023-06-28 MED ORDER — SODIUM CHLORIDE 0.9 % IV SOLN
Freq: Once | INTRAVENOUS | Status: AC
  Filled 2023-06-28: qty 250

## 2023-06-28 NOTE — Patient Instructions (Signed)
 Iron Sucrose Injection What is this medication? IRON SUCROSE (EYE ern SOO krose) treats low levels of iron (iron deficiency anemia) in people with kidney disease. Iron is a mineral that plays an important role in making red blood cells, which carry oxygen from your lungs to the rest of your body. This medicine may be used for other purposes; ask your health care provider or pharmacist if you have questions. COMMON BRAND NAME(S): Venofer What should I tell my care team before I take this medication? They need to know if you have any of these conditions: Anemia not caused by low iron levels Heart disease High levels of iron in the blood Kidney disease Liver disease An unusual or allergic reaction to iron, other medications, foods, dyes, or preservatives Pregnant or trying to get pregnant Breastfeeding How should I use this medication? This medication is for infusion into a vein. It is given in a hospital or clinic setting. Talk to your care team about the use of this medication in children. While this medication may be prescribed for children as young as 2 years for selected conditions, precautions do apply. Overdosage: If you think you have taken too much of this medicine contact a poison control center or emergency room at once. NOTE: This medicine is only for you. Do not share this medicine with others. What if I miss a dose? Keep appointments for follow-up doses. It is important not to miss your dose. Call your care team if you are unable to keep an appointment. What may interact with this medication? Do not take this medication with any of the following: Deferoxamine Dimercaprol Other iron products This medication may also interact with the following: Chloramphenicol Deferasirox This list may not describe all possible interactions. Give your health care provider a list of all the medicines, herbs, non-prescription drugs, or dietary supplements you use. Also tell them if you smoke,  drink alcohol, or use illegal drugs. Some items may interact with your medicine. What should I watch for while using this medication? Visit your care team regularly. Tell your care team if your symptoms do not start to get better or if they get worse. You may need blood work done while you are taking this medication. You may need to follow a special diet. Talk to your care team. Foods that contain iron include: whole grains/cereals, dried fruits, beans, or peas, leafy green vegetables, and organ meats (liver, kidney). What side effects may I notice from receiving this medication? Side effects that you should report to your care team as soon as possible: Allergic reactions--skin rash, itching, hives, swelling of the face, lips, tongue, or throat Low blood pressure--dizziness, feeling faint or lightheaded, blurry vision Shortness of breath Side effects that usually do not require medical attention (report to your care team if they continue or are bothersome): Flushing Headache Joint pain Muscle pain Nausea Pain, redness, or irritation at injection site This list may not describe all possible side effects. Call your doctor for medical advice about side effects. You may report side effects to FDA at 1-800-FDA-1088. Where should I keep my medication? This medication is given in a hospital or clinic. It will not be stored at home. NOTE: This sheet is a summary. It may not cover all possible information. If you have questions about this medicine, talk to your doctor, pharmacist, or health care provider.  2024 Elsevier/Gold Standard (2023-03-26 00:00:00)

## 2023-06-28 NOTE — Progress Notes (Signed)
Declined 30 minute post observation. Aware of risks. Vitals stable at discharge.  

## 2023-07-01 ENCOUNTER — Other Ambulatory Visit: Payer: Self-pay | Admitting: Psychiatry

## 2023-07-01 DIAGNOSIS — F3176 Bipolar disorder, in full remission, most recent episode depressed: Secondary | ICD-10-CM

## 2023-07-06 ENCOUNTER — Inpatient Hospital Stay: Payer: 59 | Attending: Internal Medicine

## 2023-07-06 VITALS — BP 140/46 | HR 76 | Temp 97.9°F | Resp 18

## 2023-07-06 DIAGNOSIS — K922 Gastrointestinal hemorrhage, unspecified: Secondary | ICD-10-CM | POA: Insufficient documentation

## 2023-07-06 DIAGNOSIS — D500 Iron deficiency anemia secondary to blood loss (chronic): Secondary | ICD-10-CM | POA: Insufficient documentation

## 2023-07-06 DIAGNOSIS — Z23 Encounter for immunization: Secondary | ICD-10-CM | POA: Insufficient documentation

## 2023-07-06 MED ORDER — SODIUM CHLORIDE 0.9 % IV SOLN
Freq: Once | INTRAVENOUS | Status: AC
  Filled 2023-07-06: qty 250

## 2023-07-06 MED ORDER — SODIUM CHLORIDE 0.9 % IV SOLN
200.0000 mg | Freq: Once | INTRAVENOUS | Status: AC
  Administered 2023-07-06: 200 mg via INTRAVENOUS
  Filled 2023-07-06: qty 200

## 2023-07-06 NOTE — Patient Instructions (Signed)
 Iron Sucrose Injection What is this medication? IRON SUCROSE (EYE ern SOO krose) treats low levels of iron (iron deficiency anemia) in people with kidney disease. Iron is a mineral that plays an important role in making red blood cells, which carry oxygen from your lungs to the rest of your body. This medicine may be used for other purposes; ask your health care provider or pharmacist if you have questions. COMMON BRAND NAME(S): Venofer What should I tell my care team before I take this medication? They need to know if you have any of these conditions: Anemia not caused by low iron levels Heart disease High levels of iron in the blood Kidney disease Liver disease An unusual or allergic reaction to iron, other medications, foods, dyes, or preservatives Pregnant or trying to get pregnant Breastfeeding How should I use this medication? This medication is for infusion into a vein. It is given in a hospital or clinic setting. Talk to your care team about the use of this medication in children. While this medication may be prescribed for children as young as 2 years for selected conditions, precautions do apply. Overdosage: If you think you have taken too much of this medicine contact a poison control center or emergency room at once. NOTE: This medicine is only for you. Do not share this medicine with others. What if I miss a dose? Keep appointments for follow-up doses. It is important not to miss your dose. Call your care team if you are unable to keep an appointment. What may interact with this medication? Do not take this medication with any of the following: Deferoxamine Dimercaprol Other iron products This medication may also interact with the following: Chloramphenicol Deferasirox This list may not describe all possible interactions. Give your health care provider a list of all the medicines, herbs, non-prescription drugs, or dietary supplements you use. Also tell them if you smoke,  drink alcohol, or use illegal drugs. Some items may interact with your medicine. What should I watch for while using this medication? Visit your care team regularly. Tell your care team if your symptoms do not start to get better or if they get worse. You may need blood work done while you are taking this medication. You may need to follow a special diet. Talk to your care team. Foods that contain iron include: whole grains/cereals, dried fruits, beans, or peas, leafy green vegetables, and organ meats (liver, kidney). What side effects may I notice from receiving this medication? Side effects that you should report to your care team as soon as possible: Allergic reactions--skin rash, itching, hives, swelling of the face, lips, tongue, or throat Low blood pressure--dizziness, feeling faint or lightheaded, blurry vision Shortness of breath Side effects that usually do not require medical attention (report to your care team if they continue or are bothersome): Flushing Headache Joint pain Muscle pain Nausea Pain, redness, or irritation at injection site This list may not describe all possible side effects. Call your doctor for medical advice about side effects. You may report side effects to FDA at 1-800-FDA-1088. Where should I keep my medication? This medication is given in a hospital or clinic. It will not be stored at home. NOTE: This sheet is a summary. It may not cover all possible information. If you have questions about this medicine, talk to your doctor, pharmacist, or health care provider.  2024 Elsevier/Gold Standard (2023-03-26 00:00:00)

## 2023-07-12 ENCOUNTER — Inpatient Hospital Stay: Payer: 59

## 2023-07-12 ENCOUNTER — Encounter: Payer: Self-pay | Admitting: Internal Medicine

## 2023-07-12 ENCOUNTER — Ambulatory Visit: Payer: 59 | Admitting: Psychiatry

## 2023-07-12 ENCOUNTER — Inpatient Hospital Stay (HOSPITAL_BASED_OUTPATIENT_CLINIC_OR_DEPARTMENT_OTHER): Payer: 59 | Admitting: Internal Medicine

## 2023-07-12 VITALS — BP 120/57 | HR 71 | Temp 98.7°F | Ht 60.0 in | Wt 168.4 lb

## 2023-07-12 VITALS — BP 123/68 | HR 72

## 2023-07-12 DIAGNOSIS — D500 Iron deficiency anemia secondary to blood loss (chronic): Secondary | ICD-10-CM

## 2023-07-12 DIAGNOSIS — D649 Anemia, unspecified: Secondary | ICD-10-CM | POA: Diagnosis not present

## 2023-07-12 LAB — CBC WITH DIFFERENTIAL (CANCER CENTER ONLY)
Abs Immature Granulocytes: 0.02 10*3/uL (ref 0.00–0.07)
Basophils Absolute: 0 10*3/uL (ref 0.0–0.1)
Basophils Relative: 1 %
Eosinophils Absolute: 0.2 10*3/uL (ref 0.0–0.5)
Eosinophils Relative: 4 %
HCT: 30.7 % — ABNORMAL LOW (ref 36.0–46.0)
Hemoglobin: 9.4 g/dL — ABNORMAL LOW (ref 12.0–15.0)
Immature Granulocytes: 0 %
Lymphocytes Relative: 17 %
Lymphs Abs: 0.8 10*3/uL (ref 0.7–4.0)
MCH: 25.6 pg — ABNORMAL LOW (ref 26.0–34.0)
MCHC: 30.6 g/dL (ref 30.0–36.0)
MCV: 83.7 fL (ref 80.0–100.0)
Monocytes Absolute: 0.5 10*3/uL (ref 0.1–1.0)
Monocytes Relative: 10 %
Neutro Abs: 3.1 10*3/uL (ref 1.7–7.7)
Neutrophils Relative %: 68 %
Platelet Count: 115 10*3/uL — ABNORMAL LOW (ref 150–400)
RBC: 3.67 MIL/uL — ABNORMAL LOW (ref 3.87–5.11)
RDW: 20.6 % — ABNORMAL HIGH (ref 11.5–15.5)
WBC Count: 4.6 10*3/uL (ref 4.0–10.5)
nRBC: 0 % (ref 0.0–0.2)

## 2023-07-12 LAB — CMP (CANCER CENTER ONLY)
ALT: 21 U/L (ref 0–44)
AST: 29 U/L (ref 15–41)
Albumin: 3.7 g/dL (ref 3.5–5.0)
Alkaline Phosphatase: 80 U/L (ref 38–126)
Anion gap: 6 (ref 5–15)
BUN: 18 mg/dL (ref 8–23)
CO2: 26 mmol/L (ref 22–32)
Calcium: 9.2 mg/dL (ref 8.9–10.3)
Chloride: 104 mmol/L (ref 98–111)
Creatinine: 1.18 mg/dL — ABNORMAL HIGH (ref 0.44–1.00)
GFR, Estimated: 51 mL/min — ABNORMAL LOW (ref >60)
Glucose, Bld: 166 mg/dL — ABNORMAL HIGH (ref 70–99)
Potassium: 4.6 mmol/L (ref 3.5–5.1)
Sodium: 136 mmol/L (ref 135–145)
Total Bilirubin: 0.8 mg/dL (ref 0.3–1.2)
Total Protein: 6.5 g/dL (ref 6.5–8.1)

## 2023-07-12 LAB — SAMPLE TO BLOOD BANK

## 2023-07-12 MED ORDER — SODIUM CHLORIDE 0.9 % IV SOLN
200.0000 mg | Freq: Once | INTRAVENOUS | Status: AC
  Administered 2023-07-12: 200 mg via INTRAVENOUS
  Filled 2023-07-12: qty 200

## 2023-07-12 MED ORDER — SODIUM CHLORIDE 0.9 % IV SOLN
Freq: Once | INTRAVENOUS | Status: AC
  Filled 2023-07-12: qty 250

## 2023-07-12 NOTE — Progress Notes (Signed)
[facility] CONSULT NOTE  Patient Care Team: Danella Penton, MD as PCP - General (Internal Medicine) Earna Coder, MD as Consulting Physician (Internal Medicine)  CHIEF COMPLAINTS/PURPOSE OF CONSULTATION: Anemia/thrombocytopenia   HEMATOLOGY HISTORY  06/21/2019-06/24/2019 due to severe anemia; hemoglobin of 5.7 upon admission; patient received 2 units of blood transfusion, 3 doses of IV iron. EGD and colonoscopy were performed during the hospitalization EGD and colonoscopy on 06/23/2019. [Dr.Anna; [facility]] EGD showed no abnormalities and the colonoscopy showed 3 sessile diminutive polyps which were resected completely.  Arteries of the duodenum were normal.  3 colon polyps resected were serrated polyp x2 and a tubular adenoma x1. No active source of bleeding was noted.    # Cirrhosis NAFLD- [? [facility]]  HISTORY OF PRESENTING ILLNESS: ambulating with rolling walker [arthritis].   Margaret [name] 67 y.o.  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   Complains of ongoing fatigue.  Denies any blood in stools or black or stools. Pt states she had a scan done 03/2023 and was told she has a lesion on her kidney, Dr. Kathryne Hitch  She continues to deny black or bloody stools. No abdominal pain, nausea, or vomiting.    Review of Systems  Constitutional:  Positive for malaise/fatigue. Negative for chills, diaphoresis, fever and weight loss.  HENT:  Negative for nosebleeds and sore throat.   Eyes:  Negative for double vision.  Respiratory:  Negative for cough, hemoptysis, sputum production and wheezing.   Cardiovascular:  Negative for chest pain, palpitations, orthopnea and leg swelling.  Gastrointestinal:  Negative for abdominal pain, blood in stool, constipation, diarrhea, heartburn, melena, nausea and vomiting.  Genitourinary:  Negative  for dysuria, frequency and urgency.  Musculoskeletal:  Positive for back pain and joint pain.  Skin: Negative.  Negative for itching and rash.  Neurological:  Negative for dizziness, tingling, focal weakness, weakness and headaches.  Endo/Heme/Allergies:  Does not bruise/bleed easily.  Psychiatric/Behavioral:  Negative for depression. The patient is not nervous/anxious and does not have insomnia.    MEDICAL HISTORY:  Past Medical History:  Diagnosis Date   Bipolar 1 disorder (HCC)    Depression    Diabetes mellitus without complication (HCC)    Type 2   GERD (gastroesophageal reflux disease)    Hypertension    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H (HCC)    Osteoporosis    SURGICAL HISTORY: Past Surgical History:  Procedure Laterality Date   ABDOMINAL HYSTERECTOMY     CHOLECYSTECTOMY     COLONOSCOPY WITH PROPOFOL N/A 06/23/2019   Procedure: COLONOSCOPY WITH PROPOFOL;  Surgeon: Wyline Mood, MD;  Location: [facility] ENDOSCOPY;  Service: Gastroenterology;  Laterality: N/A;   COLONOSCOPY WITH PROPOFOL N/A 12/08/2022   Procedure: COLONOSCOPY WITH PROPOFOL;  Surgeon: Regis Bill, MD;  Location: [facility] ENDOSCOPY;  Service: Endoscopy;  Laterality: N/A;   COLONOSCOPY WITH PROPOFOL N/A 05/18/2023   Procedure: COLONOSCOPY WITH PROPOFOL;  Surgeon: Regis Bill, MD;  Location: [facility] ENDOSCOPY;  Service: Endoscopy;  Laterality: N/A;   ESOPHAGOGASTRODUODENOSCOPY (EGD) WITH PROPOFOL N/A 06/23/2019   Procedure: ESOPHAGOGASTRODUODENOSCOPY (EGD) WITH PROPOFOL;  Surgeon: Wyline Mood, MD;  Location: [facility] ENDOSCOPY;  Service: Gastroenterology;  Laterality: N/A;   ESOPHAGOGASTRODUODENOSCOPY (EGD) WITH PROPOFOL N/A 12/08/2022   Procedure: ESOPHAGOGASTRODUODENOSCOPY (EGD) WITH PROPOFOL;  Surgeon: Regis Bill, MD;  Location: [facility] ENDOSCOPY;  Service: Endoscopy;  Laterality: N/A;  DM, OZEMPIC   ESOPHAGOGASTRODUODENOSCOPY (  EGD) WITH PROPOFOL N/A 01/12/2023   Procedure: ESOPHAGOGASTRODUODENOSCOPY (EGD)  WITH PROPOFOL;  Surgeon: Regis Bill, MD;  Location: [facility] ENDOSCOPY;  Service: Endoscopy;  Laterality: N/A;  DM   JOINT REPLACEMENT     POLYPECTOMY  05/18/2023   Procedure: POLYPECTOMY;  Surgeon: Regis Bill, MD;  Location: [facility] ENDOSCOPY;  Service: Endoscopy;;   TONSILLECTOMY     SOCIAL HISTORY: Social History   Socioeconomic History   Marital status: Divorced    Spouse name: Not on file   Number of children: 2   Years of education: Not on file   Highest education level: Not on file  Occupational History   Not on file  Tobacco Use   Smoking status: Former    Current packs/day: 0.00    Types: Cigarettes    Quit date: 03/03/2011    Years since quitting: 12.3   Smokeless tobacco: Never  Vaping Use   Vaping status: Never Used  Substance and Sexual Activity   Alcohol use: Yes    Comment: rarely   Drug use: Not Currently   Sexual activity: Not Currently  Other Topics Concern   Not on file  Social History Narrative   Not on file   Social Determinants of Health   Financial Resource Strain: Low Risk  (06/29/2023)   Received from [facility]   Overall Financial Resource Strain (CARDIA)    Difficulty of Paying Living Expenses: Not hard at all  Food Insecurity: No Food Insecurity (06/29/2023)   Received from [facility]   Hunger Vital Sign    Worried About Running Out of Food in the Last Year: Never true    Ran Out of Food in the Last Year: Never true  Transportation Needs: No Transportation Needs (06/29/2023)   Received from [facility] - Transportation    In the past 12 months, has lack of transportation kept you from medical appointments or from getting medications?: No    Lack of Transportation (Non-Medical): No  Physical Activity: Inactive (06/16/2021)   Received from [facility]   Exercise Vital Sign  Stress: No Stress Concern Present (06/16/2021)   Received from [facility] of Occupational Health - Occupational Stress Questionnaire  Social Connections: Socially Isolated (06/16/2021)   Received from [facility]   Social Connection and Isolation Panel [NHANES]  Intimate Partner Violence: Not on file   FAMILY HISTORY: Family History  Problem Relation Age of Onset   Mental illness Father    Bipolar disorder Brother    Bipolar disorder Paternal Uncle    Bipolar disorder Paternal Uncle    ALLERGIES:  is allergic to lithium, penicillins, sulfa antibiotics, and benzonatate.  MEDICATIONS:  Current Outpatient Medications  Medication Sig Dispense Refill   amLODipine (NORVASC) 2.5 MG tablet Take by mouth.     ARIPiprazole (ABILIFY) 10 MG tablet Take 1 tablet (10 mg total) by mouth daily. 90 tablet 0   benztropine (COGENTIN) 0.5 MG tablet Take 1 tablet (0.5 mg total) by mouth daily as needed for tremors. 15 tablet 1   escitalopram (LEXAPRO) 10 MG tablet Take 1 tablet (10 mg total) by mouth daily. 90 tablet 1   furosemide (LASIX) 20 MG tablet Take 10 mg by mouth daily as needed.     glimepiride (AMARYL) 2 MG tablet Take 2 mg by mouth every morning.     HYDROcodone-acetaminophen (NORCO) 10-325 MG tablet Take 1 tablet  by mouth every 8 (eight) hours as needed for severe pain. Must last 30 days. 90 tablet 0   [START ON 08/03/2023] HYDROcodone-acetaminophen (NORCO) 10-325 MG tablet Take 1 tablet by mouth every 8 (eight) hours as needed for severe pain. Must last 30 days. 90 tablet 0   hydrOXYzine (VISTARIL) 25 MG capsule Take 25 mg by mouth daily as needed for anxiety.     lamoTRIgine (LAMICTAL) 200 MG tablet TAKE ONE TABLET BY MOUTH ONE TIME DAILY 90 tablet 0   meclizine (ANTIVERT) 25 MG tablet Take by mouth 2 (two) times daily as needed.     OZEMPIC, 1 MG/DOSE, 4 MG/3ML SOPN Inject 1 mg into the skin once a week.     pantoprazole (PROTONIX) 40 MG tablet Take 40 mg by mouth daily.     polyethylene glycol powder  (GLYCOLAX/MIRALAX) 17 GM/SCOOP powder SMARTSIG:17 Gram(s) By Mouth 1-2 Times Daily     spironolactone (ALDACTONE) 25 MG tablet Take 25 mg by mouth daily.     tizanidine (ZANAFLEX) 2 MG capsule Take 2 mg by mouth 3 (three) times daily as needed for muscle spasms. Takes differently     alendronate (FOSAMAX) 35 MG tablet Take 35 mg by mouth every 7 (seven) days. Take with a full glass of water on an empty stomach. (Patient not taking: Reported on 07/12/2023)     metFORMIN (GLUCOPHAGE) 500 MG tablet Take 500 mg by mouth 2 (two) times daily with a meal. (Patient not taking: Reported on 07/12/2023)     No current facility-administered medications for this visit.   Facility-Administered Medications Ordered in Other Visits  Medication Dose Route Frequency Provider Last Rate Last Admin   iron sucrose (VENOFER) 200 mg in sodium chloride 0.9 % 100 mL IVPB  200 mg Intravenous Once Alinda Dooms, NP 440 mL/hr at 07/12/23 1531 200 mg at 07/12/23 1531     PHYSICAL EXAMINATION: Vitals:   07/12/23 1409  BP: (!) 120/57  Pulse: 71  Temp: 98.7 F (37.1 C)  SpO2: 98%   Filed Weights   07/12/23 1409  Weight: 168 lb 6.4 oz (76.4 kg)   Physical Exam Vitals reviewed.  Constitutional:      Appearance: She is not ill-appearing.  Eyes:     General: No scleral icterus. Cardiovascular:     Rate and Rhythm: Normal rate and regular rhythm.  Pulmonary:     Effort: No respiratory distress.  Abdominal:     General: There is no distension.     Palpations: Abdomen is soft.     Tenderness: There is no abdominal tenderness. There is no guarding.  Skin:    General: Skin is warm and dry.  Neurological:     Mental Status: She is alert and oriented to person, place, and time.  Psychiatric:        Mood and Affect: Mood normal.        Behavior: Behavior normal.      LABORATORY DATA:  I have reviewed the data as listed Lab Results  Component Value Date   WBC 4.6 07/12/2023   HGB 9.4 (L) 07/12/2023   HCT 30.7  (L) 07/12/2023   MCV 83.7 07/12/2023   PLT 115 (L) 07/12/2023   Recent Labs    04/05/23 1034 06/07/23 1321 07/12/23 1416  NA 135 133* 136  K 4.3 4.2 4.6  CL 99 101 104  CO2 29 23 26   GLUCOSE 161* 132* 166*  BUN 18 18 18   CREATININE 1.09* 1.23* 1.18*  CALCIUM  9.1 9.0 9.2  GFRNONAA 56* 49* 51*  PROT 7.1 6.9 6.5  ALBUMIN 3.8 4.0 3.7  AST 35 29 29  ALT 25 20 21   ALKPHOS 94 79 80  BILITOT 1.1 0.8 0.8     No results found.  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  FEB 2024- Iron sat 9 Ferritin 45- wnl-PCP; Hb 9-10.  s/p IV iron x4 [JAN 2024]- MM panel-WNL.   # Today Hb 9.4- proceed with venofer.   # Etiology: Cirrhosis/GI bleed FEB 2024- GAVE -non-bleeding-; colo-? Capsule study.  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  Hold off EGD for now-.  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 Stable.   #Liver cirrhosis, splenomegaly, small volume ascites.  Patient does not drink alcohol.? NAFLD-AFP. Patient will need screening ultrasounds [? [facility]]-US- 2024- March - cirrhosis;MAY 2024-MRI per GI - NO malignancy noted.  # Right kidney complex cyst [incidental MAY 2024- MRI]- s/p urology, Dr.Stoioff- MRi pending.    # weight loss sec to oezmpic [40 pounds]- stable. Low concerns for malignancy at this time.   # DISPOSITION: # venofer today; # in 1 week venofer- # follow up in 6 weeks- APP; labs- cbc/cmp; iron studies; ferritin-  possible venofer Dr.B  All questions were answered. The patient knows to call the clinic with any problems, questions or concerns.   Earna Coder, MD 07/12/2023

## 2023-07-12 NOTE — Patient Instructions (Signed)
Iron Sucrose Injection What is this medication? IRON SUCROSE (EYE ern SOO krose) treats low levels of iron (iron deficiency anemia) in people with kidney disease. Iron is a mineral that plays an important role in making red blood cells, which carry oxygen from your lungs to the rest of your body. This medicine may be used for other purposes; ask your health care provider or pharmacist if you have questions. COMMON BRAND NAME(S): Venofer What should I tell my care team before I take this medication? They need to know if you have any of these conditions: Anemia not caused by low iron levels Heart disease High levels of iron in the blood Kidney disease Liver disease An unusual or allergic reaction to iron, other medications, foods, dyes, or preservatives Pregnant or trying to get pregnant Breastfeeding How should I use this medication? This medication is for infusion into a vein. It is given in a hospital or clinic setting. Talk to your care team about the use of this medication in children. While this medication may be prescribed for children as young as 2 years for selected conditions, precautions do apply. Overdosage: If you think you have taken too much of this medicine contact a poison control center or emergency room at once. NOTE: This medicine is only for you. Do not share this medicine with others. What if I miss a dose? Keep appointments for follow-up doses. It is important not to miss your dose. Call your care team if you are unable to keep an appointment. What may interact with this medication? Do not take this medication with any of the following: Deferoxamine Dimercaprol Other iron products This medication may also interact with the following: Chloramphenicol Deferasirox This list may not describe all possible interactions. Give your health care provider a list of all the medicines, herbs, non-prescription drugs, or dietary supplements you use. Also tell them if you smoke,  drink alcohol, or use illegal drugs. Some items may interact with your medicine. What should I watch for while using this medication? Visit your care team regularly. Tell your care team if your symptoms do not start to get better or if they get worse. You may need blood work done while you are taking this medication. You may need to follow a special diet. Talk to your care team. Foods that contain iron include: whole grains/cereals, dried fruits, beans, or peas, leafy green vegetables, and organ meats (liver, kidney). What side effects may I notice from receiving this medication? Side effects that you should report to your care team as soon as possible: Allergic reactions--skin rash, itching, hives, swelling of the face, lips, tongue, or throat Low blood pressure--dizziness, feeling faint or lightheaded, blurry vision Shortness of breath Side effects that usually do not require medical attention (report to your care team if they continue or are bothersome): Flushing Headache Joint pain Muscle pain Nausea Pain, redness, or irritation at injection site This list may not describe all possible side effects. Call your doctor for medical advice about side effects. You may report side effects to FDA at 1-800-FDA-1088. Where should I keep my medication? This medication is given in a hospital or clinic. It will not be stored at home. NOTE: This sheet is a summary. It may not cover all possible information. If you have questions about this medicine, talk to your doctor, pharmacist, or health care provider.  2024 Elsevier/Gold Standard (2023-03-26 00:00:00)

## 2023-07-12 NOTE — Assessment & Plan Note (Addendum)
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  FEB 2024- Iron sat 9 Ferritin 45- wnl-PCP; Hb 9-10.  s/p IV iron x4 [JAN 2024]- MM panel-WNL.   # Today Hb 9.4- proceed with venofer.   # Etiology: Cirrhosis/GI bleed FEB 2024- GAVE -non-bleeding-; colo-? Capsule study.  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  Hold off EGD for now-.  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 Stable.   #Liver cirrhosis, splenomegaly, small volume ascites.  Patient does not drink alcohol.? NAFLD-AFP. Patient will need screening ultrasounds [? [facility]]-US- 2024- March - cirrhosis;MAY 2024-MRI per GI - NO malignancy noted.  # Right kidney complex cyst [incidental MAY 2024- MRI]- s/p urology, Dr.Stoioff- MRi pending.    # weight loss sec to oezmpic [40 pounds]- stable. Low concerns for malignancy at this time.   # DISPOSITION: # venofer today; # in 1 week venofer- # follow up in 6 weeks- APP; labs- cbc/cmp; iron studies; ferritin-  possible venofer Dr.B

## 2023-07-12 NOTE — Progress Notes (Signed)
Fatigue/weakness: yes Dyspena: no  Light headedness: no Blood in stool: no  Pt states she had a scan done 03/2023 and was told she has a lesion on her kidney, Dr. Kathryne Hitch.

## 2023-07-13 ENCOUNTER — Telehealth: Payer: Self-pay | Admitting: *Deleted

## 2023-07-13 NOTE — Telephone Encounter (Signed)
Attempted to call for pre VV questions, Message left.

## 2023-07-14 ENCOUNTER — Ambulatory Visit
Payer: 59 | Attending: Student in an Organized Health Care Education/Training Program | Admitting: Student in an Organized Health Care Education/Training Program

## 2023-07-14 ENCOUNTER — Encounter: Payer: Self-pay | Admitting: Student in an Organized Health Care Education/Training Program

## 2023-07-14 DIAGNOSIS — M25511 Pain in right shoulder: Secondary | ICD-10-CM

## 2023-07-14 DIAGNOSIS — G894 Chronic pain syndrome: Secondary | ICD-10-CM

## 2023-07-14 DIAGNOSIS — M19011 Primary osteoarthritis, right shoulder: Secondary | ICD-10-CM

## 2023-07-14 DIAGNOSIS — G8929 Other chronic pain: Secondary | ICD-10-CM

## 2023-07-14 NOTE — Progress Notes (Signed)
Patient: Margaret [name]  Service Category: E/M  Provider: Edward Jolly, MD  DOB: 1956/08/14  DOS: 07/14/2023  Location: Office  MRN: [id number]  Setting: Ambulatory outpatient  Referring Provider: Danella Penton, MD  Type: Established Patient  Specialty: Interventional Pain Management  PCP: Danella Penton, MD  Location: Remote location  Delivery: TeleHealth     Virtual Encounter - Pain Management PROVIDER NOTE: Information contained herein reflects review and annotations entered in association with encounter. Interpretation of such information and data should be left to medically-trained personnel. Information provided to patient can be located elsewhere in the medical record under "Patient Instructions". Document created using STT-dictation technology, any transcriptional errors that may result from process are unintentional.    Contact & Pharmacy Preferred: [phone] Home: [phone] (home) Mobile: [phone] (mobile) E-mail: [email or web address]  [facility] [address] [facility] - [place], [place] - [address] AT [facility] [address] [place] [address] Phone: [phone] Fax: [phone]   Pre-screening  Ms. Carras offered "in-person" vs "virtual" encounter. She indicated preferring virtual for this encounter.   Reason COVID-19*  Social distancing based on CDC and AMA recommendations.   I contacted Caffie Damme on 07/14/2023 via telephone.      I clearly identified myself as Edward Jolly, MD. I verified that I was speaking with the correct person using two identifiers (Name: Keyonia Dedeaux, and date of birth: 06/08/1956).  Consent I sought verbal advanced consent from Caffie Damme for virtual visit interactions. I informed Ms. Enns of possible security and privacy concerns, risks, and limitations associated with providing "not-in-person" medical evaluation and management services. I also informed Ms. Zuch of the availability of "in-person" appointments.  Finally, I informed her that there would be a charge for the virtual visit and that she could be  personally, fully or partially, financially responsible for it. Ms. Stalvey expressed understanding and agreed to proceed.   Historic Elements   Ms. Cheney Patman is a 67 y.o. year old, female patient evaluated today after our last contact on 06/09/2023. Ms. Lea  has a past medical history of Bipolar 1 disorder (HCC), Depression, Diabetes mellitus without complication (HCC), GERD (gastroesophageal reflux disease), Hypertension, 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H (HCC), and Osteoporosis. She also  has a past surgical history that includes Abdominal hysterectomy; Tonsillectomy; Cholecystectomy; Esophagogastroduodenoscopy (egd) with propofol (N/A, 06/23/2019); Colonoscopy with propofol (N/A, 06/23/2019); Esophagogastroduodenoscopy (egd) with propofol (N/A, 12/08/2022); Colonoscopy with propofol (N/A, 12/08/2022); Joint replacement; Esophagogastroduodenoscopy (egd) with propofol (N/A, 01/12/2023); Colonoscopy with propofol (N/A, 05/18/2023); and polypectomy (05/18/2023). Ms. Gillogly has a current medication list which includes the following prescription(s): amlodipine, aripiprazole, benztropine, escitalopram, furosemide, glimepiride, hydrocodone-acetaminophen, [START ON 08/03/2023] hydrocodone-acetaminophen, hydroxyzine, lamotrigine, meclizine, ozempic (1 mg/dose), pantoprazole, polyethylene glycol powder, spironolactone, tizanidine, alendronate, and metformin. She  reports that she quit smoking about 12 years ago. Her smoking use included cigarettes. She has never used smokeless tobacco. She reports current alcohol use. She reports that she does not currently use drugs. Ms. Bartolucci is allergic to lithium, penicillins, sulfa antibiotics, and benzonatate.  BMI: Estimated body mass index is 32.89 kg/m as calculated from the following:   Height as of 07/12/23: 5' (1.524 m).   Weight as of 07/12/23: 168 lb 6.4 oz (76.4  kg). Last encounter: 06/01/2023. Last procedure: 06/09/2023.  HPI  Today, she is being contacted for a post-procedure assessment.   Post-procedure evaluation   Procedure: Glenohumeral Joint (shoulder) Injection #1  Laterality: Right (-RT)  Level: Shoulder   Imaging: Fluoroscopy-guided  Anesthesia: Local anesthesia (1-2% Lidocaine) DOS: 06/09/2023  Performed by: Edward Jolly, MD  Purpose: Diagnostic/Therapeutic Indications: Shoulder pain severe enough to impact quality of life or function. Rationale (medical necessity): procedure needed and proper for the diagnosis and/or treatment of Ms. Wempe's medical symptoms and needs. 1. Primary osteoarthritis of right shoulder   2. Chronic right shoulder pain   3. Chronic pain syndrome    NAS-11 Pain score:   Pre-procedure: 3 /10   Post-procedure: 0-No pain/10       Effectiveness:  Initial hour after procedure: 100 %  Subsequent 4-6 hours post-procedure: 90 %  Analgesia past initial 6 hours: 50 %  Ongoing improvement:  Analgesic: 50% Function: Ms. Riofrio reports improvement in function ROM: Ms. Pin reports improvement in ROM   Pharmacotherapy Assessment   Opioid Analgesic: Norco 10 mg TID prn   Monitoring: [place] PMP: PDMP reviewed during this encounter.       Pharmacotherapy: No side-effects or adverse reactions reported. Compliance: No problems identified. Effectiveness: Clinically acceptable. Plan: Refer to "POC". UDS:  Summary  Date Value Ref Range Status  06/01/2023 Note  Final    Comment:    ==================================================================== [employer] Select 13 (MW) ==================================================================== Test                             Result       Flag       Units  Drug Present and Declared for Prescription Verification   Hydrocodone                    5552         EXPECTED   ng/mg creat   Hydromorphone                  124          EXPECTED   ng/mg  creat   Dihydrocodeine                 554          EXPECTED   ng/mg creat   Norhydrocodone                 2725         EXPECTED   ng/mg creat    Sources of hydrocodone include scheduled prescription medications.    Hydromorphone, dihydrocodeine and norhydrocodone are expected    metabolites of hydrocodone. Hydromorphone and dihydrocodeine are    also available as scheduled prescription medications.  ==================================================================== Test                      Result    Flag   Units      Ref Range   Creatinine              71               mg/dL      >=35 ==================================================================== Declared Medications:  The flagging and interpretation on this report are based on the  following declared medications.  Unexpected results may arise from  inaccuracies in the declared medications.   **Note: The testing scope of this panel includes these medications:   Hydrocodone (Norco)   **Note: The testing scope of this panel does not include the  following reported medications:   Acetaminophen (Norco)  Alendronate  Amlodipine  Aripiprazole (Abilify)  Benztropine (Cogentin)  Escitalopram (Lexapro)  Furosemide (Lasix)  Glimepiride  Hydroxyzine  Meclizine (Antivert)  Metformin  Pantoprazole (Protonix)  Polyethylene Glycol (MiraLAX)  Rifaximin (Xifaxan)  Semaglutide (Ozempic)  Spironolactone  Tizanidine ==================================================================== For clinical consultation, please call [phone]. ====================================================================    No results found for: "CBDTHCR", "D8THCCBX", "D9THCCBX"   Laboratory Chemistry Profile   Renal Lab Results  Component Value Date   BUN 18 07/12/2023   CREATININE 1.18 (H) 07/12/2023   BCR 19 12/06/2019   GFRAA 114 12/06/2019   GFRNONAA 51 (L) 07/12/2023    Hepatic Lab Results  Component Value Date   AST 29  07/12/2023   ALT 21 07/12/2023   ALBUMIN 3.7 07/12/2023   ALKPHOS 80 07/12/2023    Electrolytes Lab Results  Component Value Date   NA 136 07/12/2023   K 4.6 07/12/2023   CL 104 07/12/2023   CALCIUM 9.2 07/12/2023    Bone No results found for: "VD25OH", "VD125OH2TOT", "VH8469GE9", "BM8413KG4", "25OHVITD1", "25OHVITD2", "25OHVITD3", "TESTOFREE", "TESTOSTERONE"  Inflammation (CRP: Acute Phase) (ESR: Chronic Phase) No results found for: "CRP", "ESRSEDRATE", "LATICACIDVEN"       Note: Above Lab results reviewed.   Assessment  The primary encounter diagnosis was Primary osteoarthritis of right shoulder. Diagnoses of Chronic right shoulder pain and Chronic pain syndrome were also pertinent to this visit.  Plan of Care  Excellent response after her right shoulder steroid injection.  She is endorsing improvement in range of motion and reduction in her pain.  We will continue to monitor her symptoms.  She is complaining of right wrist pain.  She describes it as throbbing in nature.  We will continue to monitor.  I encouraged her to apply Voltaren gel to her wrist.  If this is not at her couple of weeks, we consider a steroid injection wrist on ultrasound guidance.  Patient endorsed understanding.  Follow-up plan:   Return if symptoms worsen or fail to improve.      Interventional management options: Planned, scheduled, and/or pending:    Intra-articular knee Hyalgan series #1 done 08/28/20   Considering:   Lumbar epidural steroid injection, lumbar facet medial branch nerve block pending lumbar MRI   PRN Procedures:   None at this time        Recent Visits Date Type Provider Dept  06/09/23 Procedure visit Edward Jolly, MD [facility]  06/01/23 Office Visit Edward Jolly, MD [facility]  Showing recent visits within past 90 days and meeting all other requirements Today's Visits Date Type Provider Dept  07/14/23 Office Visit Edward Jolly, MD [facility]  Showing today's visits and meeting all other requirements Future Appointments Date Type Provider Dept  08/24/23 Appointment Edward Jolly, MD [facility]  Showing future appointments within next 90 days and meeting all other requirements  I discussed the assessment and treatment plan with the patient. The patient was provided an opportunity to ask questions and all were answered. The patient agreed with the plan and demonstrated an understanding of the instructions.  Patient advised to call back or seek an in-person evaluation if the symptoms or condition worsens.  Duration of encounter: .  Note by: Edward Jolly, MD Date: 07/14/2023; Time: 2:04 PM

## 2023-07-19 ENCOUNTER — Inpatient Hospital Stay: Payer: 59

## 2023-07-19 VITALS — BP 140/50 | HR 70 | Temp 97.4°F | Resp 18

## 2023-07-19 DIAGNOSIS — D500 Iron deficiency anemia secondary to blood loss (chronic): Secondary | ICD-10-CM

## 2023-07-19 DIAGNOSIS — D509 Iron deficiency anemia, unspecified: Secondary | ICD-10-CM

## 2023-07-19 MED ORDER — INFLUENZA VAC A&B SURF ANT ADJ 0.5 ML IM SUSY
0.5000 mL | PREFILLED_SYRINGE | Freq: Once | INTRAMUSCULAR | Status: AC
  Administered 2023-07-19: 0.5 mL via INTRAMUSCULAR
  Filled 2023-07-19: qty 0.5

## 2023-07-19 MED ORDER — SODIUM CHLORIDE 0.9 % IV SOLN
200.0000 mg | Freq: Once | INTRAVENOUS | Status: AC
  Administered 2023-07-19: 200 mg via INTRAVENOUS
  Filled 2023-07-19: qty 200

## 2023-07-19 MED ORDER — SODIUM CHLORIDE 0.9 % IV SOLN
Freq: Once | INTRAVENOUS | Status: AC
  Filled 2023-07-19: qty 250

## 2023-07-23 ENCOUNTER — Ambulatory Visit (INDEPENDENT_AMBULATORY_CARE_PROVIDER_SITE_OTHER): Payer: 59 | Admitting: Psychiatry

## 2023-07-23 ENCOUNTER — Encounter: Payer: Self-pay | Admitting: Psychiatry

## 2023-07-23 VITALS — BP 138/68 | HR 73 | Temp 97.7°F | Ht 60.0 in | Wt 169.0 lb

## 2023-07-23 DIAGNOSIS — F3176 Bipolar disorder, in full remission, most recent episode depressed: Secondary | ICD-10-CM

## 2023-07-23 DIAGNOSIS — F418 Other specified anxiety disorders: Secondary | ICD-10-CM

## 2023-07-23 NOTE — Progress Notes (Signed)
BH MD OP Progress Note  07/23/2023 11:01 AM Margaret [name]  MRN:  [id number]  Chief Complaint:  Chief Complaint  Patient presents with   Follow-up   Anxiety   Depression   Manic Behavior   Medication Refill   HPI: Margaret [name] is a 67 year old Caucasian female who lives in [place], has a history of bipolar disorder, other specified anxiety disorder, hyperlipidemia,liver cirrhosis,right kidney complex cyst, thrombocytopenia, chronic pain was evaluated in office today.  Patient today reports she is currently struggling with multiple medical issues.  Patient reports she has iron deficiency, likely due to chronic bleeding-currently under the care of providers, status post polypectomy. Patient also with diagnosis of liver cirrhosis. Patient receiving iron infusions.  Patient reports she just had her infusion and is currently feeling better.  Patient otherwise denies any significant mood swings.  Denies any mania, hypomanic symptoms.  Denies any depression symptoms.  Patient denies any suicidality, homicidality or perceptual disturbances.  Patient is compliant on Abilify, Lexapro, Lamictal.  Denies side effects.  Uses the Cogentin only as needed, rarely uses it.  Does struggle with sleep since she has to urinate at night since she is on a diuretic as well as has arthritis pain.  She is on pain medications which helps to some extent.  Patient appeared to be alert, oriented to person place time and situation.  3 word memory immediate 3 out of 3, after 5 minutes 2 out of 3.  Patient was able to do serial sevens, calculation well.  Patient denies any other concerns today.  Visit Diagnosis:    ICD-10-CM   1. Bipolar disorder, in full remission, most recent episode depressed (HCC)  F31.76    type I    2. Other specified anxiety disorders  F41.8    with limited symptom attacks      Past Psychiatric History: I have reviewed past psychiatric history from progress note on 08/21/2019.  Past  trials of Depakote, lithium, risperidone, Xanax.  Past Medical History:  Past Medical History:  Diagnosis Date   Bipolar 1 disorder (HCC)    Depression    Diabetes mellitus without complication (HCC)    Type 2   GERD (gastroesophageal reflux disease)    Hypertension    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H [facility])    Osteoporosis     Past Surgical History:  Procedure Laterality Date   ABDOMINAL HYSTERECTOMY     CHOLECYSTECTOMY     COLONOSCOPY WITH PROPOFOL N/A 06/23/2019   Procedure: COLONOSCOPY WITH PROPOFOL;  Surgeon: Wyline Mood, MD;  Location: [facility] ENDOSCOPY;  Service: Gastroenterology;  Laterality: N/A;   COLONOSCOPY WITH PROPOFOL N/A 12/08/2022   Procedure: COLONOSCOPY WITH PROPOFOL;  Surgeon: Regis Bill, MD;  Location: [facility] ENDOSCOPY;  Service: Endoscopy;  Laterality: N/A;   COLONOSCOPY WITH PROPOFOL N/A 05/18/2023   Procedure: COLONOSCOPY WITH PROPOFOL;  Surgeon: Regis Bill, MD;  Location: [facility] ENDOSCOPY;  Service: Endoscopy;  Laterality: N/A;   ESOPHAGOGASTRODUODENOSCOPY (EGD) WITH PROPOFOL N/A 06/23/2019   Procedure: ESOPHAGOGASTRODUODENOSCOPY (EGD) WITH PROPOFOL;  Surgeon: Wyline Mood, MD;  Location: [facility] ENDOSCOPY;  Service: Gastroenterology;  Laterality: N/A;   ESOPHAGOGASTRODUODENOSCOPY (EGD) WITH PROPOFOL N/A 12/08/2022   Procedure: ESOPHAGOGASTRODUODENOSCOPY (EGD) WITH PROPOFOL;  Surgeon: Regis Bill, MD;  Location: [facility] ENDOSCOPY;  Service: Endoscopy;  Laterality: N/A;  DM, OZEMPIC   ESOPHAGOGASTRODUODENOSCOPY (EGD) WITH PROPOFOL N/A 01/12/2023   Procedure: ESOPHAGOGASTRODUODENOSCOPY (EGD) WITH PROPOFOL;  Surgeon: Regis Bill, MD;  Location: [facility] ENDOSCOPY;  Service: Endoscopy;  Laterality: N/A;  DM  JOINT REPLACEMENT     POLYPECTOMY  05/18/2023   Procedure: POLYPECTOMY;  Surgeon: Regis Bill, MD;  Location: [facility] ENDOSCOPY;  Service: Endoscopy;;   TONSILLECTOMY      Family Psychiatric History: I have reviewed family psychiatric  history from progress note on 08/21/2019.  Family History:  Family History  Problem Relation Age of Onset   Mental illness Father    Bipolar disorder Brother    Bipolar disorder Paternal Uncle    Bipolar disorder Paternal Uncle     Social History: I have reviewed social history from progress note on 08/21/2019. Social History   Socioeconomic History   Marital status: Divorced    Spouse name: Not on file   Number of children: 2   Years of education: Not on file   Highest education level: Not on file  Occupational History   Not on file  Tobacco Use   Smoking status: Former    Current packs/day: 0.00    Types: Cigarettes    Quit date: 03/03/2011    Years since quitting: 12.3   Smokeless tobacco: Never  Vaping Use   Vaping status: Never Used  Substance and Sexual Activity   Alcohol use: Yes    Comment: rarely   Drug use: Not Currently   Sexual activity: Not Currently  Other Topics Concern   Not on file  Social History Narrative   Not on file   Social Determinants of Health   Financial Resource Strain: Low Risk  (06/29/2023)   Received from [facility]   Overall Financial Resource Strain (CARDIA)    Difficulty of Paying Living Expenses: Not hard at all  Food Insecurity: No Food Insecurity (06/29/2023)   Received from [facility]   Hunger Vital Sign    Worried About Running Out of Food in the Last Year: Never true    Ran Out of Food in the Last Year: Never true  Transportation Needs: No Transportation Needs (06/29/2023)   Received from [facility] - Transportation    In the past 12 months, has lack of transportation kept you from medical appointments or from getting medications?: No    Lack of Transportation (Non-Medical): No  Physical Activity: Inactive (06/16/2021)   Received from [facility]   Exercise Vital Sign  Stress: No Stress Concern Present (06/16/2021)   Received from [facility] - Occupational Stress Questionnaire  Social Connections: Socially Isolated (06/16/2021)   Received from [facility]   Social Connection and Isolation Panel [NHANES]    Allergies:  Allergies  Allergen Reactions   Lithium Nausea And Vomiting and Other (See Comments)   Penicillins Hives    Resulted in hospitalization   Sulfa Antibiotics Hives    Resulted in hospitalization   Benzonatate Other (See Comments)    Metabolic Disorder Labs: Lab Results  Component Value Date   HGBA1C 7.3 (H) 06/22/2019   MPG 162.81 06/22/2019   No results found for: "PROLACTIN" No results found for: "CHOL", "TRIG", "HDL", "CHOLHDL", "VLDL", "LDLCALC" Lab Results  Component Value Date   TSH 1.463 12/29/2022   TSH 1.737 11/16/2019    Therapeutic Level Labs: No results found for: "LITHIUM" No results found for: "VALPROATE" No results found for: "CBMZ"  Current Medications: Current Outpatient Medications  Medication Sig Dispense Refill   alendronate (FOSAMAX) 35 MG tablet Take 35 mg by mouth every 7 (seven) days. Take  with a full glass of water on an empty stomach.     amLODipine (NORVASC) 2.5 MG tablet Take by mouth.     ARIPiprazole (ABILIFY) 10 MG tablet Take 1 tablet (10 mg total) by mouth daily. 90 tablet 0   benztropine (COGENTIN) 0.5 MG tablet Take 1 tablet (0.5 mg total) by mouth daily as needed for tremors. 15 tablet 1   escitalopram (LEXAPRO) 10 MG tablet Take 1 tablet (10 mg total) by mouth daily. 90 tablet 1   furosemide (LASIX) 20 MG tablet Take 10 mg by mouth daily as needed.     glimepiride (AMARYL) 2 MG tablet Take 2 mg by mouth every morning.     HYDROcodone-acetaminophen (NORCO) 10-325 MG tablet Take 1 tablet by mouth every 8 (eight) hours as needed for severe pain. Must last 30 days. 90 tablet 0   [START ON 08/03/2023] HYDROcodone-acetaminophen (NORCO) 10-325 MG tablet Take 1 tablet by mouth every  8 (eight) hours as needed for severe pain. Must last 30 days. 90 tablet 0   hydrOXYzine (VISTARIL) 25 MG capsule Take 25 mg by mouth daily as needed for anxiety.     lamoTRIgine (LAMICTAL) 200 MG tablet TAKE ONE TABLET BY MOUTH ONE TIME DAILY 90 tablet 0   meclizine (ANTIVERT) 25 MG tablet Take by mouth 2 (two) times daily as needed.     metFORMIN (GLUCOPHAGE) 500 MG tablet Take 500 mg by mouth 2 (two) times daily with a meal.     OZEMPIC, 1 MG/DOSE, 4 MG/3ML SOPN Inject 1 mg into the skin once a week.     pantoprazole (PROTONIX) 40 MG tablet Take 40 mg by mouth daily.     polyethylene glycol powder (GLYCOLAX/MIRALAX) 17 GM/SCOOP powder SMARTSIG:17 Gram(s) By Mouth 1-2 Times Daily     spironolactone (ALDACTONE) 25 MG tablet Take 25 mg by mouth daily.     tizanidine (ZANAFLEX) 2 MG capsule Take 2 mg by mouth 3 (three) times daily as needed for muscle spasms. Takes differently     No current facility-administered medications for this visit.     Musculoskeletal: Strength & Muscle Tone: within normal limits Gait & Station:  walks with walker  Patient leans: Front  Psychiatric Specialty Exam: Review of Systems  Psychiatric/Behavioral:  Positive for sleep disturbance.     Blood pressure 138/68, pulse 73, temperature 97.7 F (36.5 C), temperature source Skin, height 5' (1.524 m), weight 169 lb (76.7 kg).Body mass index is 33.01 kg/m.  General Appearance: Fairly Groomed  Eye Contact:  Fair  Speech:  Clear and Coherent  Volume:  Normal  Mood:  Euthymic  Affect:  Congruent  Thought Process:  Goal Directed and Descriptions of Associations: Intact  Orientation:  Full (Time, Place, and Person)  Thought Content: Logical   Suicidal Thoughts:  No  Homicidal Thoughts:  No  Memory:  Immediate;   Fair Recent;   Fair Remote;   Fair  Judgement:  Fair  Insight:  Fair  Psychomotor Activity:  Normal  Concentration:  Concentration: Fair and Attention Span: Poor  Recall:  [employer] of Knowledge:  Fair  Language: Fair  Akathisia:  No  Handed:  Right  AIMS (if indicated): done, patient denies any significant tremors, abnormal involuntary movements.  Assets:  Communication Skills Desire for Improvement Housing Social Support  ADL's:  Intact  Cognition: WNL  Sleep:   restless due to medical issues, pain, frequent urination   Screenings: AIMS    Flowsheet Row Office Visit from 07/23/2023 in  [facility] Video Visit from 03/16/2023 in [facility] Office Visit from 10/13/2022 in [facility] Video Visit from 05/14/2022 in [facility] Video Visit from 02/13/2022 in [facility]  AIMS Total Score 0 0 0 0 0      GAD-7    Flowsheet Row Office Visit from 07/23/2023 in [facility] Office Visit from 10/13/2022 in [facility] Video Visit from 02/13/2022 in [facility]  Total GAD-7 Score 2 2 2       [id number]    Flowsheet Row Office Visit from 07/23/2023 in [facility] Office Visit from 06/01/2023 in [facility] Video Visit from 03/16/2023 in [facility] Office Visit from 02/18/2023 in [facility] Visit from 11/12/2022 in [facility] Total Score 0 0 0 0 0      Flowsheet Row Office Visit from 07/23/2023 in [facility] Admission (Discharged) from 05/18/2023 in [facility] Video Visit from 03/16/2023 in [facility]  C-SSRS RISK CATEGORY No Risk No Risk No Risk        Assessment and Plan: Patrese Brutto is a 67 year old Caucasian female who has a history of bipolar disorder, chronic pain on disability, divorced, currently struggling with iron deficiency, anemia, as well as on her diuretic which causes sleep issues, otherwise doing fairly well on the current medication regimen with regards to her mood.  Plan as noted below.  Plan Bipolar disorder in remission, most recent episode depressed Lamictal 200 mg p.o. daily Abilify 10 mg p.o. daily Benztropine 0.5 mg as needed for tremors.  Others specified anxiety disorder with limited symptom attacks-stable Lexapro 10 mg p.o. daily-reduced dosage Hydroxyzine 25-50 mg at bedtime as needed.  I have reviewed labs 06/22/2023-vitamin B12-617 , TSH-1.948, lipid panel-within normal limits, hemoglobin A1c-6.7, elevated, CMP-creatinine 1.3-elevated, GFR low at 46, glucose abnormal at 141.  CBC with differential-abnormal-patient currently under the care of providers for the same.    Collaboration of Care: Collaboration of Care: Primary Care Provider AEB patient to continue to follow up with her primary care provider for abnormal labs  Patient/Guardian was advised Release of Information must be obtained prior to any record release in order to collaborate their care with an outside provider. Patient/Guardian was advised if they have not already done so to contact the registration department to sign all necessary forms in order for [place] to release information regarding their care.   Consent: Patient/Guardian gives verbal consent for treatment and assignment of benefits for services provided during this visit. Patient/Guardian expressed understanding and agreed to proceed.   Follow-up in clinic in 6 months or sooner if needed.  This note was generated in part or whole with voice recognition software. Voice recognition is usually quite accurate but  there are transcription errors that can and very often do occur. I apologize for any typographical errors that were not detected and corrected.    Jomarie Longs, MD 07/23/2023, 2:13 PM

## 2023-07-28 ENCOUNTER — Encounter: Payer: Self-pay | Admitting: Internal Medicine

## 2023-08-03 ENCOUNTER — Ambulatory Visit
Admission: RE | Admit: 2023-08-03 | Discharge: 2023-08-03 | Disposition: A | Discharge status: Home / Self Care | Payer: 59 | Source: Ambulatory Visit | Attending: Urology | Admitting: Urology

## 2023-08-03 DIAGNOSIS — N2889 Other specified disorders of kidney and ureter: Secondary | ICD-10-CM | POA: Diagnosis present

## 2023-08-03 MED ORDER — GADOBUTROL 1 MMOL/ML IV SOLN
7.5000 mL | Freq: Once | INTRAVENOUS | Status: AC | PRN
  Administered 2023-08-03: 7.5 mL via INTRAVENOUS

## 2023-08-10 ENCOUNTER — Encounter: Payer: Self-pay | Admitting: Urology

## 2023-08-18 ENCOUNTER — Other Ambulatory Visit (HOSPITAL_COMMUNITY): Payer: Self-pay | Admitting: Psychiatry

## 2023-08-18 DIAGNOSIS — F3176 Bipolar disorder, in full remission, most recent episode depressed: Secondary | ICD-10-CM

## 2023-08-23 ENCOUNTER — Inpatient Hospital Stay: Payer: 59

## 2023-08-23 ENCOUNTER — Inpatient Hospital Stay (HOSPITAL_BASED_OUTPATIENT_CLINIC_OR_DEPARTMENT_OTHER): Payer: 59 | Admitting: Nurse Practitioner

## 2023-08-23 ENCOUNTER — Telehealth: Payer: Self-pay | Admitting: *Deleted

## 2023-08-23 ENCOUNTER — Inpatient Hospital Stay: Payer: 59 | Attending: Internal Medicine

## 2023-08-23 ENCOUNTER — Encounter: Payer: Self-pay | Admitting: Nurse Practitioner

## 2023-08-23 VITALS — BP 137/42 | HR 68 | Temp 98.1°F | Wt 169.0 lb

## 2023-08-23 VITALS — BP 117/41 | HR 72

## 2023-08-23 DIAGNOSIS — D649 Anemia, unspecified: Secondary | ICD-10-CM

## 2023-08-23 DIAGNOSIS — D696 Thrombocytopenia, unspecified: Secondary | ICD-10-CM

## 2023-08-23 DIAGNOSIS — D500 Iron deficiency anemia secondary to blood loss (chronic): Secondary | ICD-10-CM | POA: Insufficient documentation

## 2023-08-23 DIAGNOSIS — K922 Gastrointestinal hemorrhage, unspecified: Secondary | ICD-10-CM | POA: Insufficient documentation

## 2023-08-23 DIAGNOSIS — Z79899 Other long term (current) drug therapy: Secondary | ICD-10-CM | POA: Insufficient documentation

## 2023-08-23 LAB — CBC WITH DIFFERENTIAL (CANCER CENTER ONLY)
Abs Immature Granulocytes: 0.01 10*3/uL (ref 0.00–0.07)
Basophils Absolute: 0 10*3/uL (ref 0.0–0.1)
Basophils Relative: 1 %
Eosinophils Absolute: 0.1 10*3/uL (ref 0.0–0.5)
Eosinophils Relative: 2 %
HCT: 31.4 % — ABNORMAL LOW (ref 36.0–46.0)
Hemoglobin: 10 g/dL — ABNORMAL LOW (ref 12.0–15.0)
Immature Granulocytes: 0 %
Lymphocytes Relative: 23 %
Lymphs Abs: 1.1 10*3/uL (ref 0.7–4.0)
MCH: 26.2 pg (ref 26.0–34.0)
MCHC: 31.8 g/dL (ref 30.0–36.0)
MCV: 82.4 fL (ref 80.0–100.0)
Monocytes Absolute: 0.5 10*3/uL (ref 0.1–1.0)
Monocytes Relative: 11 %
Neutro Abs: 3.1 10*3/uL (ref 1.7–7.7)
Neutrophils Relative %: 63 %
Platelet Count: 119 10*3/uL — ABNORMAL LOW (ref 150–400)
RBC: 3.81 MIL/uL — ABNORMAL LOW (ref 3.87–5.11)
RDW: 16.6 % — ABNORMAL HIGH (ref 11.5–15.5)
WBC Count: 4.8 10*3/uL (ref 4.0–10.5)
nRBC: 0 % (ref 0.0–0.2)

## 2023-08-23 LAB — CMP (CANCER CENTER ONLY)
ALT: 18 U/L (ref 0–44)
AST: 28 U/L (ref 15–41)
Albumin: 4.1 g/dL (ref 3.5–5.0)
Alkaline Phosphatase: 86 U/L (ref 38–126)
Anion gap: 8 (ref 5–15)
BUN: 19 mg/dL (ref 8–23)
CO2: 27 mmol/L (ref 22–32)
Calcium: 9.2 mg/dL (ref 8.9–10.3)
Chloride: 102 mmol/L (ref 98–111)
Creatinine: 1.01 mg/dL — ABNORMAL HIGH (ref 0.44–1.00)
GFR, Estimated: >60 mL/min (ref >60)
Glucose, Bld: 94 mg/dL (ref 70–99)
Potassium: 4.1 mmol/L (ref 3.5–5.1)
Sodium: 137 mmol/L (ref 135–145)
Total Bilirubin: 0.6 mg/dL (ref 0.3–1.2)
Total Protein: 6.9 g/dL (ref 6.5–8.1)

## 2023-08-23 LAB — IRON AND TIBC
Iron: 34 ug/dL (ref 28–170)
Saturation Ratios: 6 % — ABNORMAL LOW (ref 10.4–31.8)
TIBC: 554 ug/dL — ABNORMAL HIGH (ref 250–450)
UIBC: 520 ug/dL

## 2023-08-23 LAB — FERRITIN: Ferritin: 28 ng/mL (ref 11–307)

## 2023-08-23 MED ORDER — SODIUM CHLORIDE 0.9 % IV SOLN
Freq: Once | INTRAVENOUS | Status: AC
  Filled 2023-08-23: qty 250

## 2023-08-23 MED ORDER — SODIUM CHLORIDE 0.9 % IV SOLN
200.0000 mg | Freq: Once | INTRAVENOUS | Status: AC
  Administered 2023-08-23: 200 mg via INTRAVENOUS
  Filled 2023-08-23: qty 200

## 2023-08-23 NOTE — Addendum Note (Signed)
Addended by: Alinda Deem H on: 08/23/2023 11:48 AM   Modules accepted: Orders

## 2023-08-23 NOTE — Progress Notes (Signed)
[facility] CONSULT NOTE  Patient Care Team: Danella Penton, MD as PCP - General (Internal Medicine) Earna Coder, MD as Consulting Physician (Internal Medicine)  CHIEF COMPLAINTS/PURPOSE OF CONSULTATION: Anemia/thrombocytopenia  HEMATOLOGY HISTORY  06/21/2019-06/24/2019 due to severe anemia; hemoglobin of 5.7 upon admission; patient received 2 units of blood transfusion, 3 doses of IV iron. EGD and colonoscopy were performed during the hospitalization EGD and colonoscopy on 06/23/2019. [Dr.Anna; [facility]] EGD showed no abnormalities and the colonoscopy showed 3 sessile diminutive polyps which were resected completely.  Arteries of the duodenum were normal.  3 colon polyps resected were serrated polyp x2 and a tubular adenoma x1. No active source of bleeding was noted.    # Cirrhosis NAFLD- [? [facility]]  HISTORY OF PRESENTING ILLNESS: ambulating with rolling walker [arthritis].   Margaret [name] 67 y.o.  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 She continues to feel fatigued. She denies black or bloody stools. Denies abdominal pain, nausea, or vomiting. She has been receiving iv iron weekly. She is planning a trip to [place] to visit her daughter in early November x 3 weeks.     Review of Systems  Constitutional:  Positive for malaise/fatigue. Negative for chills, diaphoresis, fever and weight loss.  HENT:  Negative for nosebleeds and sore throat.   Eyes:  Negative for double vision.  Respiratory:  Negative for cough, hemoptysis, sputum production and wheezing.   Cardiovascular:  Negative for chest pain, palpitations, orthopnea and leg swelling.  Gastrointestinal:  Negative for abdominal pain, blood in stool, constipation, diarrhea, heartburn, melena, nausea and vomiting.  Genitourinary:  Negative for dysuria, frequency and  urgency.  Musculoskeletal:  Positive for back pain and joint pain.  Skin: Negative.  Negative for itching and rash.  Neurological:  Negative for dizziness, tingling, focal weakness, weakness and headaches.  Endo/Heme/Allergies:  Does not bruise/bleed easily.  Psychiatric/Behavioral:  Negative for depression. The patient is not nervous/anxious and does not have insomnia.    MEDICAL HISTORY:  Past Medical History:  Diagnosis Date   Bipolar 1 disorder (HCC)    Depression    Diabetes mellitus without complication (HCC)    Type 2   GERD (gastroesophageal reflux disease)    Hypertension    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H (HCC)    Osteoporosis    SURGICAL HISTORY: Past Surgical History:  Procedure Laterality Date   ABDOMINAL HYSTERECTOMY     CHOLECYSTECTOMY     COLONOSCOPY WITH PROPOFOL N/A 06/23/2019   Procedure: COLONOSCOPY WITH PROPOFOL;  Surgeon: Wyline Mood, MD;  Location: [facility] ENDOSCOPY;  Service: Gastroenterology;  Laterality: N/A;   COLONOSCOPY WITH PROPOFOL N/A 12/08/2022   Procedure: COLONOSCOPY WITH PROPOFOL;  Surgeon: Regis Bill, MD;  Location: [facility] ENDOSCOPY;  Service: Endoscopy;  Laterality: N/A;   COLONOSCOPY WITH PROPOFOL N/A 05/18/2023   Procedure: COLONOSCOPY WITH PROPOFOL;  Surgeon: Regis Bill, MD;  Location: [facility] ENDOSCOPY;  Service: Endoscopy;  Laterality: N/A;   ESOPHAGOGASTRODUODENOSCOPY (EGD) WITH PROPOFOL N/A 06/23/2019   Procedure: ESOPHAGOGASTRODUODENOSCOPY (EGD) WITH PROPOFOL;  Surgeon: Wyline Mood, MD;  Location: [facility] ENDOSCOPY;  Service: Gastroenterology;  Laterality: N/A;   ESOPHAGOGASTRODUODENOSCOPY (EGD) WITH PROPOFOL N/A 12/08/2022   Procedure: ESOPHAGOGASTRODUODENOSCOPY (EGD) WITH PROPOFOL;  Surgeon: Regis Bill, MD;  Location: [facility] ENDOSCOPY;  Service: Endoscopy;  Laterality: N/A;  DM, OZEMPIC   ESOPHAGOGASTRODUODENOSCOPY (EGD) WITH PROPOFOL N/A 01/12/2023   Procedure: ESOPHAGOGASTRODUODENOSCOPY (EGD)  WITH PROPOFOL;  Surgeon:  Regis Bill, MD;  Location: [facility] ENDOSCOPY;  Service: Endoscopy;  Laterality: N/A;  DM   JOINT REPLACEMENT     POLYPECTOMY  05/18/2023   Procedure: POLYPECTOMY;  Surgeon: Regis Bill, MD;  Location: [facility] ENDOSCOPY;  Service: Endoscopy;;   TONSILLECTOMY     SOCIAL HISTORY: Social History   Socioeconomic History   Marital status: Divorced    Spouse name: Not on file   Number of children: 2   Years of education: Not on file   Highest education level: Not on file  Occupational History   Not on file  Tobacco Use   Smoking status: Former    Current packs/day: 0.00    Types: Cigarettes    Quit date: 03/03/2011    Years since quitting: 12.4   Smokeless tobacco: Never  Vaping Use   Vaping status: Never Used  Substance and Sexual Activity   Alcohol use: Yes    Comment: rarely   Drug use: Not Currently   Sexual activity: Not Currently  Other Topics Concern   Not on file  Social History Narrative   Not on file   Social Determinants of Health   Financial Resource Strain: Low Risk  (06/29/2023)   Received from [facility]   Overall Financial Resource Strain (CARDIA)    Difficulty of Paying Living Expenses: Not hard at all  Food Insecurity: No Food Insecurity (06/29/2023)   Received from [facility]   Hunger Vital Sign    Worried About Running Out of Food in the Last Year: Never true    Ran Out of Food in the Last Year: Never true  Transportation Needs: No Transportation Needs (06/29/2023)   Received from [facility] - Transportation    In the past 12 months, has lack of transportation kept you from medical appointments or from getting medications?: No    Lack of Transportation (Non-Medical): No  Physical Activity: Inactive (06/16/2021)   Received from [facility]   Exercise Vital Sign  Stress: No Stress Concern Present (06/16/2021)   Received from [facility] - Occupational Stress Questionnaire  Social Connections: Socially Isolated (06/16/2021)   Received from [facility]   Social Connection and Isolation Panel [NHANES]  Intimate Partner Violence: Not on file   FAMILY HISTORY: Family History  Problem Relation Age of Onset   Mental illness Father    Bipolar disorder Brother    Bipolar disorder Paternal Uncle    Bipolar disorder Paternal Uncle    ALLERGIES:  is allergic to lithium, penicillins, sulfa antibiotics, and benzonatate.  MEDICATIONS:  Current Outpatient Medications  Medication Sig Dispense Refill   alendronate (FOSAMAX) 35 MG tablet Take 35 mg by mouth every 7 (seven) days. Take with a full glass of water on an empty stomach.     amLODipine (NORVASC) 2.5 MG tablet Take by mouth.     ARIPiprazole (ABILIFY) 10 MG tablet Take 1 tablet (10 mg total) by mouth daily. 90 tablet 0   benztropine (COGENTIN) 0.5 MG tablet Take 1 tablet (0.5 mg total) by mouth daily as needed for tremors. 15 tablet 1   escitalopram (LEXAPRO) 10 MG tablet Take 1 tablet (10 mg total) by mouth daily. 90 tablet 1   furosemide (LASIX) 20 MG tablet Take 10 mg by mouth daily as needed.     glimepiride (AMARYL) 2 MG tablet  Take 2 mg by mouth every morning.     HYDROcodone-acetaminophen (NORCO) 10-325 MG tablet Take 1 tablet by mouth every 8 (eight) hours as needed for severe pain. Must last 30 days. 90 tablet 0   hydrOXYzine (VISTARIL) 25 MG capsule Take 25 mg by mouth daily as needed for anxiety.     lamoTRIgine (LAMICTAL) 200 MG tablet TAKE ONE TABLET BY MOUTH ONE TIME DAILY 90 tablet 0   meclizine (ANTIVERT) 25 MG tablet Take by mouth 2 (two) times daily as needed.     metFORMIN (GLUCOPHAGE) 500 MG tablet Take 500 mg by mouth 2 (two) times daily with a meal.     OZEMPIC, 1 MG/DOSE, 4 MG/3ML SOPN Inject 1 mg into the skin once a week.     pantoprazole (PROTONIX) 40 MG tablet Take 40 mg by mouth daily.      polyethylene glycol powder (GLYCOLAX/MIRALAX) 17 GM/SCOOP powder SMARTSIG:17 Gram(s) By Mouth 1-2 Times Daily     spironolactone (ALDACTONE) 25 MG tablet Take 25 mg by mouth daily.     tizanidine (ZANAFLEX) 2 MG capsule Take 2 mg by mouth 3 (three) times daily as needed for muscle spasms. Takes differently     No current facility-administered medications for this visit.     PHYSICAL EXAMINATION: Vitals:   08/23/23 0857  BP: (!) 137/42  Pulse: 68  Temp: 98.1 F (36.7 C)  SpO2: 100%   Filed Weights   08/23/23 0857  Weight: 169 lb (76.7 kg)    Physical Exam Vitals reviewed.  Constitutional:      Appearance: She is not ill-appearing.  Eyes:     General: No scleral icterus. Cardiovascular:     Rate and Rhythm: Normal rate and regular rhythm.  Pulmonary:     Effort: No respiratory distress.  Abdominal:     General: There is no distension.     Palpations: Abdomen is soft.     Tenderness: There is no abdominal tenderness. There is no guarding.  Skin:    General: Skin is warm and dry.  Neurological:     Mental Status: She is alert and oriented to person, place, and time.  Psychiatric:        Mood and Affect: Mood normal.        Behavior: Behavior normal.     LABORATORY DATA:  I have reviewed the data as listed Lab Results  Component Value Date   WBC 4.8 08/23/2023   HGB 10.0 (L) 08/23/2023   HCT 31.4 (L) 08/23/2023   MCV 82.4 08/23/2023   PLT 119 (L) 08/23/2023   Recent Labs    04/05/23 1034 06/07/23 1321 07/12/23 1416  NA 135 133* 136  K 4.3 4.2 4.6  CL 99 101 104  CO2 29 23 26   GLUCOSE 161* 132* 166*  BUN 18 18 18   CREATININE 1.09* 1.23* 1.18*  CALCIUM 9.1 9.0 9.2  GFRNONAA 56* 49* 51*  PROT 7.1 6.9 6.5  ALBUMIN 3.8 4.0 3.7  AST 35 29 29  ALT 25 20 21   ALKPHOS 94 79 80  BILITOT 1.1 0.8 0.8   Iron/TIBC/Ferritin/ %Sat    Component Value Date/Time   IRON 22 (L) 06/07/2023 1321   IRON 37 12/06/2019 1507   TIBC 588 (H) 06/07/2023 1321   TIBC 428  12/06/2019 1507   FERRITIN 6 (L) 06/07/2023 1321   FERRITIN 45 12/06/2019 1507   IRONPCTSAT 4 (L) 06/07/2023 1321   IRONPCTSAT 9 (LL) 12/06/2019 1507     MR ABDOMEN  WWO CONTRAST  Result Date: 08/08/2023 CLINICAL DATA:  Follow-up Bosniak category III right renal lesion, cirrhosis EXAM: MRI ABDOMEN WITHOUT AND WITH CONTRAST TECHNIQUE: Multiplanar multisequence MR imaging of the abdomen was performed both before and after the administration of intravenous contrast. CONTRAST:  7.23mL GADAVIST GADOBUTROL 1 MMOL/ML IV SOLN COMPARISON:  03/10/2023 FINDINGS: Lower chest: No acute abnormality. Hepatobiliary: Coarse, nodular cirrhotic morphology of the liver. Hepatic signal inversion on in and opposed phase imaging as well as intrinsic T2 hypointensity of the liver parenchyma, consistent with iron deposition. Status post cholecystectomy. No biliary dilatation. Pancreas: Unremarkable. No pancreatic ductal dilatation or surrounding inflammatory changes. Spleen: Splenomegaly, maximum coronal span 14.8 cm Adrenals/Urinary Tract: Adrenal glands are unremarkable. Slight interval enlargement of a complex cystic lesion of the peripheral inferior pole of the right kidney measuring 1.4 x 1.3 cm, previously 1.2 x 1.1 cm, with increasingly thick, contrast enhancing irregular internal septations (series 16, image 52). Stomach/Bowel: Stomach is within normal limits. No evidence of bowel wall thickening, distention, or inflammatory changes. Vascular/Lymphatic: Numerous varices varices about the left upper quadrant and retroperitoneum. Unchanged enlarged gastrohepatic ligament, portacaval and porta hepatis lymph nodes. Other: No abdominal wall hernia or abnormality. No ascites. Musculoskeletal: No acute or significant osseous findings. IMPRESSION: 1. 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 2. No suspicious liver lesions or contrast enhancement. 3. Cirrhosis with stigmata of portal hypertension including splenomegaly and numerous varices. No ascites. 4. Hepatic iron deposition. 5. Unchanged enlarged gastrohepatic ligament, portacaval and porta hepatis lymph nodes, likely reactive to cirrhosis. Electronically Signed   By: Jearld Lesch M.D.   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  FEB 2024- Iron sat 9 Ferritin 45- wnl-PCP; Hb 9-10.  s/p IV iron x4 [JAN 2024]- MM panel-WNL.   # Last received Venofer 07/19/23 (6 doses from 06/21/23-07/19/23 for hmg 9.4). Tolerated well. Hmg 10 today. Proceed with venofer today and weekly. Iron stores pending. Clinically given that her hemoglobin has not improved very much I suspect she has ongoing bleeding. Recommend she reach out to GI for possible EGD. She may need additional doses of IV iron prior to trip to [place] and I reviewed that it's unclear if her counts would remain stable during this trip. She plans to travel anyway and will seek care in [place] if needed. We will plan to see her back first week of December when she plans to return. If her trip is cancelled, we can see her sooner.    # Etiology: Cirrhosis/GI bleed - FEB 2024- GAVE nonbleeding. March 2024- EGD consistent with GAVE. 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 Portal hypertensive gastropathy. Colonoscopy - prep was inadequate. Repeated 05/2023. Next due in 05/2026. Has not had capsule study. Per GI, Dr. 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 Recommend she reach out to Dr. Mia Creek for possible repeat EGD.    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 Stable.    # Liver cirrhosis, splenomegaly, small volume ascites.  Patient does not drink alcohol. Secondary to NASH/NAFLD - diabetic- AFP. Patient will need screening ultrasounds [? [facility]]-US- 2024-  March - cirrhosis; MAY 2024-MRI per GI - NO malignancy noted.   # Right kidney complex cyst [incidental MAY 2024- MRI]- s/p urology, Dr.Stoioff. 08/03/23 MRI revealed slight enlargement of cyst with increasing septations. She has visit on 10/24 to discuss plans.    # weight loss sec to ozempic [40 pounds]- stable. Low concerns for malignancy at this time.    # DISPOSITION: # venofer today # 1 week & 2 weeks - venofer # 6 weeks- lab (cbc, cmp, ferritin, iron studies, hold tube), Dr Donneta Romberg, +/- venofer- la  No problem-specific Assessment & Plan notes found for this encounter.  All questions were answered. The patient knows to call the clinic with any problems, questions or concerns.   Alinda Dooms, NP 08/23/2023   CC: Dr. Mia Creek

## 2023-08-23 NOTE — Telephone Encounter (Signed)
[facility] GI department requesting that we send a stat referral for this patient to e seen next week orr it will be March before she can get an appointment even though she is a patient there

## 2023-08-23 NOTE — Telephone Encounter (Signed)
Referral to [facility] GI - faxed by Preston Fleeting

## 2023-08-24 ENCOUNTER — Ambulatory Visit
Payer: 59 | Attending: Student in an Organized Health Care Education/Training Program | Admitting: Student in an Organized Health Care Education/Training Program

## 2023-08-24 ENCOUNTER — Encounter: Payer: Self-pay | Admitting: Student in an Organized Health Care Education/Training Program

## 2023-08-24 DIAGNOSIS — M47816 Spondylosis without myelopathy or radiculopathy, lumbar region: Secondary | ICD-10-CM | POA: Diagnosis present

## 2023-08-24 DIAGNOSIS — G8929 Other chronic pain: Secondary | ICD-10-CM | POA: Diagnosis present

## 2023-08-24 DIAGNOSIS — Z79891 Long term (current) use of opiate analgesic: Secondary | ICD-10-CM | POA: Diagnosis not present

## 2023-08-24 DIAGNOSIS — Z96642 Presence of left artificial hip joint: Secondary | ICD-10-CM | POA: Diagnosis not present

## 2023-08-24 DIAGNOSIS — M5416 Radiculopathy, lumbar region: Secondary | ICD-10-CM | POA: Diagnosis present

## 2023-08-24 DIAGNOSIS — M4726 Other spondylosis with radiculopathy, lumbar region: Secondary | ICD-10-CM

## 2023-08-24 DIAGNOSIS — G894 Chronic pain syndrome: Secondary | ICD-10-CM | POA: Diagnosis not present

## 2023-08-24 MED ORDER — HYDROCODONE-ACETAMINOPHEN 10-325 MG PO TABS: 1.0000 | ORAL_TABLET | Freq: Three times a day (TID) | ORAL | 0 refills | Status: DC | PRN

## 2023-08-24 MED ORDER — HYDROCODONE-ACETAMINOPHEN 10-325 MG PO TABS: 1.0000 | ORAL_TABLET | Freq: Three times a day (TID) | ORAL | 0 refills | Status: AC | PRN

## 2023-08-24 NOTE — Patient Instructions (Signed)
Recommend Magnesium supplementation for muscle cramps [phone] mg at night

## 2023-08-24 NOTE — Progress Notes (Signed)
Nursing Pain Medication Assessment:  Safety precautions to be maintained throughout the outpatient stay will include: orient to surroundings, keep bed in low position, maintain call bell within reach at all times, provide assistance with transfer out of bed and ambulation.  Medication Inspection Compliance: Pill count conducted under aseptic conditions, in front of the patient. Neither the pills nor the bottle was removed from the patient's sight at any time. Once count was completed pills were immediately returned to the patient in their original bottle.  Medication: Hydrocodone/APAP Pill/Patch Count:  49 of 90 pills remain Pill/Patch Appearance: Markings consistent with prescribed medication Bottle Appearance: Standard pharmacy container. Clearly labeled. Filled Date: 29 / 3 / 2024 Last Medication intake:  TodaySafety precautions to be maintained throughout the outpatient stay will include: orient to surroundings, keep bed in low position, maintain call bell within reach at all times, provide assistance with transfer out of bed and ambulation.

## 2023-08-24 NOTE — Progress Notes (Signed)
PROVIDER NOTE: Information contained herein reflects review and annotations entered in association with encounter. Interpretation of such information and data should be left to medically-trained personnel. Information provided to patient can be located elsewhere in the medical record under "Patient Instructions". Document created using STT-dictation technology, any transcriptional errors that may result from process are unintentional.    Patient: Margaret [name]  Service Category: E/M  Provider: Edward Jolly, MD  DOB: 03/16/1956  DOS: 08/24/2023  Referring Provider: Danella Penton, MD  MRN: [id number]  Specialty: Interventional Pain Management  PCP: Danella Penton, MD  Type: Established Patient  Setting: Ambulatory outpatient    Location: Office  Delivery: Face-to-face     HPI  Ms. Kasee Masuda, a 67 y.o. year old female, is here today because of her No primary diagnosis found.. Ms. Mirra primary complain today is Leg Pain (left)  Pertinent problems: Ms. Moravec has Chronic pain syndrome; Encounter for long-term opiate analgesic use; Bilateral primary osteoarthritis of knee; SI (sacroiliac) joint dysfunction; History of left hip replacement; Recurrent major depressive episodes, moderate (HCC); Cervical radicular pain; Pain management contract signed; Lumbar facet arthropathy; Chronic right shoulder pain; and Cervical facet joint syndrome on their pertinent problem list. Pain Assessment: Severity of Chronic pain is reported as a 7 /10. Location: Leg Left/pain radiaties down her leg. Onset: More than a month ago. Quality: Aching. Timing: Constant. Modifying factor(s): meds. Vitals:  height is 5' (1.524 m) and weight is 169 lb (76.7 kg). Her temperature is 97.2 F (36.2 C) (abnormal). Her blood pressure is 120/54 (abnormal) and her pulse is 78. Her oxygen saturation is 99%.  BMI: Estimated body mass index is 33.01 kg/m as calculated from the following:   Height as of this encounter: 5' (1.524 m).    Weight as of this encounter: 169 lb (76.7 kg). Last encounter: 07/14/2023. Last procedure: 06/09/2023.  Reason for encounter: medication management.   Overall patient is doing well.  No significant change in her medical history since her last clinic visit with me.  She is endorsing increased lumbar paraspinal muscle spasms.  She states that this could be related to weather.  I suggested that she start supplementation with magnesium and also increase her water intake. She continues to endorse relief in her right shoulder after her right glenohumeral joint injection.  She has improved range of motion.  She is still receiving adequate benefit from this, we can repeat as needed.  Pharmacotherapy Assessment  Analgesic: Norco 10 mg TID prn   Monitoring: Ali Mol[place] PMP: PDMP reviewed during this encounter.       Pharmacotherapy: No side-effects or adverse reactions reported. Compliance: No problems identified. Effectiveness: Clinically acceptable.  Brigitte Pulse, RN  08/24/2023 10:11 AM  Sign when Signing Visit Nursing Pain Medication Assessment:  Safety precautions to be maintained throughout the outpatient stay will include: orient to surroundings, keep bed in low position, maintain call bell within reach at all times, provide assistance with transfer out of bed and ambulation.  Medication Inspection Compliance: Pill count conducted under aseptic conditions, in front of the patient. Neither the pills nor the bottle was removed from the patient's sight at any time. Once count was completed pills were immediately returned to the patient in their original bottle.  Medication: Hydrocodone/APAP Pill/Patch Count:  49 of 90 pills remain Pill/Patch Appearance: Markings consistent with prescribed medication Bottle Appearance: Standard pharmacy container. Clearly labeled. Filled Date: 42 / 3 / 2024 Last Medication intake:  TodaySafety precautions to be maintained throughout the  outpatient stay will include:  orient to surroundings, keep bed in low position, maintain call bell within reach at all times, provide assistance with transfer out of bed and ambulation.   No results found for: "CBDTHCR" No results found for: "D8THCCBX" No results found for: "D9THCCBX"  UDS:  Summary  Date Value Ref Range Status  06/01/2023 Note  Final    Comment:    ==================================================================== [employer] [facility] (MW) ==================================================================== Test                             Result       Flag       Units  Drug Present and Declared for Prescription Verification   Hydrocodone                    5552         EXPECTED   ng/mg creat   Hydromorphone                  124          EXPECTED   ng/mg creat   Dihydrocodeine                 554          EXPECTED   ng/mg creat   Norhydrocodone                 2725         EXPECTED   ng/mg creat    Sources of hydrocodone include scheduled prescription medications.    Hydromorphone, dihydrocodeine and norhydrocodone are expected    metabolites of hydrocodone. Hydromorphone and dihydrocodeine are    also available as scheduled prescription medications.  ==================================================================== Test                      Result    Flag   Units      Ref Range   Creatinine              71               mg/dL      >=60 ==================================================================== Declared Medications:  The flagging and interpretation on this report are based on the  following declared medications.  Unexpected results may arise from  inaccuracies in the declared medications.   **Note: The testing scope of this panel includes these medications:   Hydrocodone (Norco)   **Note: The testing scope of this panel does not include the  following reported medications:   Acetaminophen (Norco)  Alendronate  Amlodipine  Aripiprazole (Abilify)  Benztropine (Cogentin)   Escitalopram (Lexapro)  Furosemide (Lasix)  Glimepiride  Hydroxyzine  Meclizine (Antivert)  Metformin  Pantoprazole (Protonix)  Polyethylene Glycol (MiraLAX)  Rifaximin (Xifaxan)  Semaglutide (Ozempic)  Spironolactone  Tizanidine ==================================================================== For clinical consultation, please call [phone]. ====================================================================       ROS  Constitutional: Denies any fever or chills Gastrointestinal: No reported hemesis, hematochezia, vomiting, or acute GI distress Musculoskeletal:  lumbar paraspinal muscle spasms Neurological: No reported episodes of acute onset apraxia, aphasia, dysarthria, agnosia, amnesia, paralysis, loss of coordination, or loss of consciousness  Medication Review  ARIPiprazole, HYDROcodone-acetaminophen, Semaglutide (1 MG/DOSE), alendronate, amLODipine, benztropine, escitalopram, furosemide, glimepiride, hydrOXYzine, lamoTRIgine, meclizine, metFORMIN, pantoprazole, polyethylene glycol powder, spironolactone, and tizanidine  History Review  Allergy: Ms. Dunnaway is allergic to lithium, penicillins, sulfa antibiotics, and benzonatate. Drug: Ms. Leaman  reports that she does  not currently use drugs. Alcohol:  reports current alcohol use. Tobacco:  reports that she quit smoking about 12 years ago. Her smoking use included cigarettes. She has never used smokeless tobacco. Social: Ms. Clopp  reports that she quit smoking about 12 years ago. Her smoking use included cigarettes. She has never used smokeless tobacco. She reports current alcohol use. She reports that she does not currently use drugs. Medical:  has a past medical history of Bipolar 1 disorder (HCC), Depression, Diabetes mellitus without complication (HCC), GERD (gastroesophageal reflux disease), Hypertension, 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H (HCC), and Osteoporosis. Surgical: Ms. Palomera  has a past surgical  history that includes Abdominal hysterectomy; Tonsillectomy; Cholecystectomy; Esophagogastroduodenoscopy (egd) with propofol (N/A, 06/23/2019); Colonoscopy with propofol (N/A, 06/23/2019); Esophagogastroduodenoscopy (egd) with propofol (N/A, 12/08/2022); Colonoscopy with propofol (N/A, 12/08/2022); Joint replacement; Esophagogastroduodenoscopy (egd) with propofol (N/A, 01/12/2023); Colonoscopy with propofol (N/A, 05/18/2023); and polypectomy (05/18/2023). Family: family history includes Bipolar disorder in her brother, paternal uncle, and paternal uncle; Mental illness in her father.  Laboratory Chemistry Profile   Renal Lab Results  Component Value Date   BUN 19 08/23/2023   CREATININE 1.01 (H) 08/23/2023   BCR 19 12/06/2019   GFRAA 114 12/06/2019   GFRNONAA >60 08/23/2023    Hepatic Lab Results  Component Value Date   AST 28 08/23/2023   ALT 18 08/23/2023   ALBUMIN 4.1 08/23/2023   ALKPHOS 86 08/23/2023    Electrolytes Lab Results  Component Value Date   NA 137 08/23/2023   K 4.1 08/23/2023   CL 102 08/23/2023   CALCIUM 9.2 08/23/2023    Bone No results found for: "VD25OH", "VD125OH2TOT", "KV4259DG3", "OV5643PI9", "25OHVITD1", "25OHVITD2", "25OHVITD3", "TESTOFREE", "TESTOSTERONE"  Inflammation (CRP: Acute Phase) (ESR: Chronic Phase) No results found for: "CRP", "ESRSEDRATE", "LATICACIDVEN"       Note: Above Lab results reviewed.  Recent Imaging Review  MR ABDOMEN WWO CONTRAST CLINICAL DATA:  Follow-up Bosniak category III right renal lesion, cirrhosis  EXAM: MRI ABDOMEN WITHOUT AND WITH CONTRAST  TECHNIQUE: Multiplanar multisequence MR imaging of the abdomen was performed both before and after the administration of intravenous contrast.  CONTRAST:  7.48mL GADAVIST GADOBUTROL 1 MMOL/ML IV SOLN  COMPARISON:  03/10/2023  FINDINGS: Lower chest: No acute abnormality.  Hepatobiliary: Coarse, nodular cirrhotic morphology of the liver. Hepatic signal inversion on in and  opposed phase imaging as well as intrinsic T2 hypointensity of the liver parenchyma, consistent with iron deposition. Status post cholecystectomy. No biliary dilatation.  Pancreas: Unremarkable. No pancreatic ductal dilatation or surrounding inflammatory changes.  Spleen: Splenomegaly, maximum coronal span 14.8 cm  Adrenals/Urinary Tract: Adrenal glands are unremarkable. Slight interval enlargement of a complex cystic lesion of the peripheral inferior pole of the right kidney measuring 1.4 x 1.3 cm, previously 1.2 x 1.1 cm, with increasingly thick, contrast enhancing irregular internal septations (series 16, image 52).  Stomach/Bowel: Stomach is within normal limits. No evidence of bowel wall thickening, distention, or inflammatory changes.  Vascular/Lymphatic: Numerous varices varices about the left upper quadrant and retroperitoneum. Unchanged enlarged gastrohepatic ligament, portacaval and porta hepatis lymph nodes.  Other: No abdominal wall hernia or abnormality. No ascites.  Musculoskeletal: No acute or significant osseous findings.  IMPRESSION: 1. 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 2. No  suspicious liver lesions or contrast enhancement. 3. Cirrhosis with stigmata of portal hypertension including splenomegaly and numerous varices. No ascites. 4. Hepatic iron deposition. 5. Unchanged enlarged gastrohepatic ligament, portacaval and porta hepatis lymph nodes, likely reactive to cirrhosis.  Electronically Signed   By: Jearld Lesch M.D.   On: 08/08/2023 07:24 Note: Reviewed        Physical Exam  General appearance: Well nourished, well developed, and well hydrated. In no  apparent acute distress Mental status: Alert, oriented x 3 (person, place, & time)       Respiratory: No evidence of acute respiratory distress Eyes: PERLA Vitals: BP (!) 120/54   Pulse 78   Temp (!) 97.2 F (36.2 C)   Ht 5' (1.524 m)   Wt 169 lb (76.7 kg)   SpO2 99%   BMI 33.01 kg/m  BMI: Estimated body mass index is 33.01 kg/m as calculated from the following:   Height as of this encounter: 5' (1.524 m).   Weight as of this encounter: 169 lb (76.7 kg). Ideal: Ideal body weight: 45.5 kg (100 lb 4.9 oz) Adjusted ideal body weight: 58 kg (127 lb 12.6 oz)  Assessment   Diagnosis Status  1. Encounter for long-term opiate analgesic use   2. History of left hip replacement (2017)   3. Lumbar facet arthropathy   4. Chronic radicular lumbar pain   5. Chronic pain syndrome    Controlled Controlled Controlled    Ms. Norma Zakarian has a current medication list which includes the following long-term medication(s): aripiprazole, benztropine, escitalopram, furosemide, lamotrigine, metformin, pantoprazole, and spironolactone.  Pharmacotherapy (Medications Ordered): Meds ordered this encounter  Medications   HYDROcodone-acetaminophen (NORCO) 10-325 MG tablet    Sig: Take 1 tablet by mouth every 8 (eight) hours as needed for severe pain (pain score 7-10). Must last 30 days.    Dispense:  90 tablet    Refill:  0    Chronic Pain: STOP Act (Not applicable) Fill 1 day early if closed on refill date. Avoid benzodiazepines within 8 hours of opioids   HYDROcodone-acetaminophen (NORCO) 10-325 MG tablet    Sig: Take 1 tablet by mouth every 8 (eight) hours as needed for severe pain (pain score 7-10). Must last 30 days.    Dispense:  90 tablet    Refill:  0    Chronic Pain: STOP Act (Not applicable) Fill 1 day early if closed on refill date. Avoid benzodiazepines within 8 hours of opioids   HYDROcodone-acetaminophen (NORCO) 10-325 MG tablet    Sig: Take 1 tablet by mouth every 8 (eight) hours as  needed for severe pain (pain score 7-10). Must last 30 days.    Dispense:  90 tablet    Refill:  0    Chronic Pain: STOP Act (Not applicable) Fill 1 day early if closed on refill date. Avoid benzodiazepines within 8 hours of opioids   Consider magnesium for lumbar paraspinal muscle spasms  Orders:  No orders of the defined types were placed in this encounter.  Follow-up plan:   Return in about 3 months (around 12/02/2023) for MM, F2F.      Interventional management options: Planned, scheduled, and/or pending:    Intra-articular knee Hyalgan series #1 done 08/28/20   Considering:   Lumbar epidural steroid injection, lumbar facet medial branch nerve block pending lumbar MRI   PRN Procedures:   None at this time         Recent Visits Date Type Provider Dept  07/14/23 Office Visit  Edward Jolly, MD [facility]  06/09/23 Procedure visit Edward Jolly, MD [facility]  06/01/23 Office Visit Edward Jolly, MD [facility]  Showing recent visits within past 90 days and meeting all other requirements Today's Visits Date Type Provider Dept  08/24/23 Office Visit Edward Jolly, MD [facility]  Showing today's visits and meeting all other requirements Future Appointments No visits were found meeting these conditions. Showing future appointments within next 90 days and meeting all other requirements  I discussed the assessment and treatment plan with the patient. The patient was provided an opportunity to ask questions and all were answered. The patient agreed with the plan and demonstrated an understanding of the instructions.  Patient advised to call back or seek an in-person evaluation if the symptoms or condition worsens.  Duration of encounter: .  Total time on encounter, as per AMA guidelines included both the face-to-face and non-face-to-face time personally spent by the physician and/or other qualified health care professional(s)  on the day of the encounter (includes time in activities that require the physician or other qualified health care professional and does not include time in activities normally performed by clinical staff). Physician's time may include the following activities when performed: Preparing to see the patient (e.g., pre-charting review of records, searching for previously ordered imaging, lab work, and nerve conduction tests) Review of prior analgesic pharmacotherapies. Reviewing PMP Interpreting ordered tests (e.g., lab work, imaging, nerve conduction tests) Performing post-procedure evaluations, including interpretation of diagnostic procedures Obtaining and/or reviewing separately obtained history Performing a medically appropriate examination and/or evaluation Counseling and educating the patient/family/caregiver Ordering medications, tests, or procedures Referring and communicating with other health care professionals (when not separately reported) Documenting clinical information in the electronic or other health record Independently interpreting results (not separately reported) and communicating results to the patient/ family/caregiver Care coordination (not separately reported)  Note by: Edward Jolly, MD Date: 08/24/2023; Time: 11:08 AM

## 2023-08-25 ENCOUNTER — Other Ambulatory Visit (HOSPITAL_COMMUNITY): Payer: Self-pay | Admitting: Psychiatry

## 2023-08-25 DIAGNOSIS — F3176 Bipolar disorder, in full remission, most recent episode depressed: Secondary | ICD-10-CM

## 2023-08-25 NOTE — Telephone Encounter (Signed)
received fax requesting a refill on the aripiprazole. pt was last seen on 9-20 next appt 3-21

## 2023-08-26 ENCOUNTER — Encounter: Payer: Self-pay | Admitting: Urology

## 2023-08-26 ENCOUNTER — Ambulatory Visit (INDEPENDENT_AMBULATORY_CARE_PROVIDER_SITE_OTHER): Payer: 59 | Admitting: Urology

## 2023-08-26 ENCOUNTER — Encounter: Payer: 59 | Admitting: Student in an Organized Health Care Education/Training Program

## 2023-08-26 ENCOUNTER — Other Ambulatory Visit: Payer: Self-pay | Admitting: Urology

## 2023-08-26 VITALS — BP 145/64 | HR 76 | Ht 60.0 in | Wt 170.0 lb

## 2023-08-26 DIAGNOSIS — N281 Cyst of kidney, acquired: Secondary | ICD-10-CM

## 2023-08-26 DIAGNOSIS — N2889 Other specified disorders of kidney and ureter: Secondary | ICD-10-CM

## 2023-08-26 NOTE — Progress Notes (Signed)
I, Maysun Anabel Bene, acting as a scribe for Riki Altes, MD., have documented all relevant documentation on the behalf of Riki Altes, MD, as directed by Riki Altes, MD while in the presence of Riki Altes, MD.  08/26/2023 1:10 PM   Margaret [name] 01-08-1956 [id number]  Referring provider: Danella Penton, MD [phone] [facility] [address] [facility] [place],  [place] [address]  Chief Complaint  Patient presents with   Results   Urologic history: 1. 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   HPI: Margaret [name] is a 67 y.o. female presents for 3 month follow-up.  No complaints since last visit.  Denies flank, abdominal pain.  No gross hematuria.  Follow-up MRI 08/03/23 shows mild increase in size of the Bosniak 3 cyst from 1.2 x 1.1 cm to 1.4 x 1.3 cm. She was also noted to have increased thickness of the enhancing irregular internal septations.   PMH: Past Medical History:  Diagnosis Date   Bipolar 1 disorder (HCC)    Depression    Diabetes mellitus without complication (HCC)    Type 2   GERD (gastroesophageal reflux disease)    Hypertension    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H [facility])    Osteoporosis     Surgical History: Past Surgical History:  Procedure Laterality Date   ABDOMINAL HYSTERECTOMY     CHOLECYSTECTOMY     COLONOSCOPY WITH PROPOFOL N/A 06/23/2019   Procedure: COLONOSCOPY WITH PROPOFOL;  Surgeon: Wyline Mood, MD;  Location: [facility] ENDOSCOPY;  Service: Gastroenterology;  Laterality: N/A;   COLONOSCOPY WITH PROPOFOL N/A 12/08/2022   Procedure: COLONOSCOPY WITH PROPOFOL;  Surgeon: Regis Bill, MD;  Location: [facility] ENDOSCOPY;  Service: Endoscopy;  Laterality: N/A;   COLONOSCOPY WITH PROPOFOL N/A 05/18/2023   Procedure: COLONOSCOPY WITH PROPOFOL;  Surgeon: Regis Bill, MD;  Location: [facility] ENDOSCOPY;  Service: Endoscopy;  Laterality: N/A;    ESOPHAGOGASTRODUODENOSCOPY (EGD) WITH PROPOFOL N/A 06/23/2019   Procedure: ESOPHAGOGASTRODUODENOSCOPY (EGD) WITH PROPOFOL;  Surgeon: Wyline Mood, MD;  Location: [facility] ENDOSCOPY;  Service: Gastroenterology;  Laterality: N/A;   ESOPHAGOGASTRODUODENOSCOPY (EGD) WITH PROPOFOL N/A 12/08/2022   Procedure: ESOPHAGOGASTRODUODENOSCOPY (EGD) WITH PROPOFOL;  Surgeon: Regis Bill, MD;  Location: [facility] ENDOSCOPY;  Service: Endoscopy;  Laterality: N/A;  DM, OZEMPIC   ESOPHAGOGASTRODUODENOSCOPY (EGD) WITH PROPOFOL N/A 01/12/2023   Procedure: ESOPHAGOGASTRODUODENOSCOPY (EGD) WITH PROPOFOL;  Surgeon: Regis Bill, MD;  Location: [facility] ENDOSCOPY;  Service: Endoscopy;  Laterality: N/A;  DM   JOINT REPLACEMENT     POLYPECTOMY  05/18/2023   Procedure: POLYPECTOMY;  Surgeon: Regis Bill, MD;  Location: [facility] ENDOSCOPY;  Service: Endoscopy;;   TONSILLECTOMY      Home Medications:  Allergies as of 08/26/2023       Reactions   Lithium Nausea And Vomiting, Other (See Comments)   Penicillins Hives   Resulted in hospitalization   Sulfa Antibiotics Hives   Resulted in hospitalization   Benzonatate Other (See Comments)        Medication List        Accurate as of August 26, 2023  1:10 PM. If you have any questions, ask your nurse or doctor.          alendronate 35 MG tablet Commonly known as: FOSAMAX Take 35 mg by mouth every 7 (seven) days. Take with a full glass of water on an empty stomach.   amLODipine 2.5 MG tablet Commonly  known as: NORVASC Take by mouth.   ARIPiprazole 10 MG tablet Commonly known as: ABILIFY TAKE ONE TABLET BY MOUTH ONE TIME DAILY   benztropine 0.5 MG tablet Commonly known as: COGENTIN Take 1 tablet (0.5 mg total) by mouth daily as needed for tremors.   escitalopram 10 MG tablet Commonly known as: LEXAPRO Take 1 tablet (10 mg total) by mouth daily.   furosemide 20 MG tablet Commonly known as: LASIX Take 10 mg by mouth daily as needed.    glimepiride 2 MG tablet Commonly known as: AMARYL Take 2 mg by mouth every morning.   HYDROcodone-acetaminophen 10-325 MG tablet Commonly known as: Norco Take 1 tablet by mouth every 8 (eight) hours as needed for severe pain (pain score 7-10). Must last 30 days. Start taking on: September 04, 2023   HYDROcodone-acetaminophen 10-325 MG tablet Commonly known as: Norco Take 1 tablet by mouth every 8 (eight) hours as needed for severe pain (pain score 7-10). Must last 30 days. Start taking on: October 04, 2023   HYDROcodone-acetaminophen 10-325 MG tablet Commonly known as: Norco Take 1 tablet by mouth every 8 (eight) hours as needed for severe pain (pain score 7-10). Must last 30 days. Start taking on: November 03, 2023   hydrOXYzine 25 MG capsule Commonly known as: VISTARIL Take 25 mg by mouth daily as needed for anxiety.   lamoTRIgine 200 MG tablet Commonly known as: LAMICTAL TAKE ONE TABLET BY MOUTH ONE TIME DAILY   meclizine 25 MG tablet Commonly known as: ANTIVERT Take by mouth 2 (two) times daily as needed.   metFORMIN 500 MG tablet Commonly known as: GLUCOPHAGE Take 500 mg by mouth 2 (two) times daily with a meal.   Ozempic (1 MG/DOSE) 4 MG/3ML Sopn Generic drug: Semaglutide (1 MG/DOSE) Inject 1 mg into the skin once a week.   pantoprazole 40 MG tablet Commonly known as: PROTONIX Take 40 mg by mouth daily.   polyethylene glycol powder 17 GM/SCOOP powder Commonly known as: GLYCOLAX/MIRALAX SMARTSIG:17 Gram(s) By Mouth 1-2 Times Daily   spironolactone 25 MG tablet Commonly known as: ALDACTONE Take 25 mg by mouth daily.   tizanidine 2 MG capsule Commonly known as: ZANAFLEX Take 2 mg by mouth 3 (three) times daily as needed for muscle spasms. Takes differently        Allergies:  Allergies  Allergen Reactions   Lithium Nausea And Vomiting and Other (See Comments)   Penicillins Hives    Resulted in hospitalization   Sulfa Antibiotics Hives    Resulted in  hospitalization   Benzonatate Other (See Comments)    Family History: Family History  Problem Relation Age of Onset   Mental illness Father    Bipolar disorder Brother    Bipolar disorder Paternal Uncle    Bipolar disorder Paternal Uncle     Social History:  reports that she quit smoking about 12 years ago. Her smoking use included cigarettes. She has never used smokeless tobacco. She reports current alcohol use. She reports that she does not currently use drugs.   Physical Exam: BP (!) 145/64   Pulse 76   Ht 5' (1.524 m)   Wt 170 lb (77.1 kg)   BMI 33.20 kg/m   Constitutional:  Alert, No acute distress. HEENT: [place] AT Respiratory: Normal respiratory effort, no increased work of breathing. Psychiatric: Normal mood and affect.   Pertinent Imaging: MRI was personally reviewed and interpreted.  MRI  EXAM: MRI ABDOMEN WITHOUT AND WITH CONTRAST   TECHNIQUE: Multiplanar multisequence  MR imaging of the abdomen was performed both before and after the administration of intravenous contrast.   CONTRAST:  7.73mL GADAVIST GADOBUTROL 1 MMOL/ML IV SOLN   COMPARISON:  03/10/2023   FINDINGS: Lower chest: No acute abnormality.   Hepatobiliary: Coarse, nodular cirrhotic morphology of the liver. Hepatic signal inversion on in and opposed phase imaging as well as intrinsic T2 hypointensity of the liver parenchyma, consistent with iron deposition. Status post cholecystectomy. No biliary dilatation.   Pancreas: Unremarkable. No pancreatic ductal dilatation or surrounding inflammatory changes.   Spleen: Splenomegaly, maximum coronal span 14.8 cm   Adrenals/Urinary Tract: Adrenal glands are unremarkable. Slight interval enlargement of a complex cystic lesion of the peripheral inferior pole of the right kidney measuring 1.4 x 1.3 cm, previously 1.2 x 1.1 cm, with increasingly thick, contrast enhancing irregular internal septations (series 16, image 52).   Stomach/Bowel: Stomach is  within normal limits. No evidence of bowel wall thickening, distention, or inflammatory changes.   Vascular/Lymphatic: Numerous varices varices about the left upper quadrant and retroperitoneum. Unchanged enlarged gastrohepatic ligament, portacaval and porta hepatis lymph nodes.   Other: No abdominal wall hernia or abnormality. No ascites.   Musculoskeletal: No acute or significant osseous findings.   IMPRESSION: 1. 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 2. No suspicious liver lesions or contrast enhancement. 3. Cirrhosis with stigmata of portal hypertension including splenomegaly and numerous varices. No ascites. 4. Hepatic iron deposition. 5. Unchanged enlarged gastrohepatic ligament, portacaval and porta hepatis lymph nodes, likely reactive to cirrhosis.     Electronically Signed   By: Jearld Lesch M.D.   On: 08/08/2023 07:24  Assessment & Plan:    1. Bosniak 3 renal cyst Minimal size increase, though also increased prominence and thickening of the internal septations.  Recommend interventional radiology consultation to discuss percutaneous cryoablation, which she would like to pursue.   I have reviewed the above documentation for accuracy and completeness, and I agree with the above.   Riki Altes, MD  [facility] [address] [place], [place] [address] [phone]

## 2023-08-30 ENCOUNTER — Inpatient Hospital Stay: Payer: 59

## 2023-08-30 VITALS — BP 132/52 | HR 64 | Temp 96.7°F | Resp 20

## 2023-08-30 DIAGNOSIS — D500 Iron deficiency anemia secondary to blood loss (chronic): Secondary | ICD-10-CM

## 2023-08-30 MED ORDER — IRON SUCROSE 20 MG/ML IV SOLN
200.0000 mg | Freq: Once | INTRAVENOUS | Status: AC
Start: 2023-08-30 — End: 2023-08-30
  Administered 2023-08-30: 200 mg via INTRAVENOUS
  Filled 2023-08-30: qty 10

## 2023-08-30 NOTE — Patient Instructions (Signed)
Iron Sucrose Injection What is this medication? IRON SUCROSE (EYE ern SOO krose) treats low levels of iron (iron deficiency anemia) in people with kidney disease. Iron is a mineral that plays an important role in making red blood cells, which carry oxygen from your lungs to the rest of your body. This medicine may be used for other purposes; ask your health care provider or pharmacist if you have questions. COMMON BRAND NAME(S): Venofer What should I tell my care team before I take this medication? They need to know if you have any of these conditions: Anemia not caused by low iron levels Heart disease High levels of iron in the blood Kidney disease Liver disease An unusual or allergic reaction to iron, other medications, foods, dyes, or preservatives Pregnant or trying to get pregnant Breastfeeding How should I use this medication? This medication is for infusion into a vein. It is given in a hospital or clinic setting. Talk to your care team about the use of this medication in children. While this medication may be prescribed for children as young as 2 years for selected conditions, precautions do apply. Overdosage: If you think you have taken too much of this medicine contact a poison control center or emergency room at once. NOTE: This medicine is only for you. Do not share this medicine with others. What if I miss a dose? Keep appointments for follow-up doses. It is important not to miss your dose. Call your care team if you are unable to keep an appointment. What may interact with this medication? Do not take this medication with any of the following: Deferoxamine Dimercaprol Other iron products This medication may also interact with the following: Chloramphenicol Deferasirox This list may not describe all possible interactions. Give your health care provider a list of all the medicines, herbs, non-prescription drugs, or dietary supplements you use. Also tell them if you smoke,  drink alcohol, or use illegal drugs. Some items may interact with your medicine. What should I watch for while using this medication? Visit your care team regularly. Tell your care team if your symptoms do not start to get better or if they get worse. You may need blood work done while you are taking this medication. You may need to follow a special diet. Talk to your care team. Foods that contain iron include: whole grains/cereals, dried fruits, beans, or peas, leafy green vegetables, and organ meats (liver, kidney). What side effects may I notice from receiving this medication? Side effects that you should report to your care team as soon as possible: Allergic reactions--skin rash, itching, hives, swelling of the face, lips, tongue, or throat Low blood pressure--dizziness, feeling faint or lightheaded, blurry vision Shortness of breath Side effects that usually do not require medical attention (report to your care team if they continue or are bothersome): Flushing Headache Joint pain Muscle pain Nausea Pain, redness, or irritation at injection site This list may not describe all possible side effects. Call your doctor for medical advice about side effects. You may report side effects to FDA at 1-800-FDA-1088. Where should I keep my medication? This medication is given in a hospital or clinic. It will not be stored at home. NOTE: This sheet is a summary. It may not cover all possible information. If you have questions about this medicine, talk to your doctor, pharmacist, or health care provider.  2024 Elsevier/Gold Standard (2023-03-26 00:00:00)

## 2023-09-01 ENCOUNTER — Other Ambulatory Visit: Payer: Self-pay | Admitting: Gastroenterology

## 2023-09-01 DIAGNOSIS — K7460 Unspecified cirrhosis of liver: Secondary | ICD-10-CM

## 2023-09-06 ENCOUNTER — Inpatient Hospital Stay: Payer: 59 | Attending: Internal Medicine

## 2023-09-06 VITALS — BP 120/49 | HR 64 | Temp 97.4°F | Resp 16

## 2023-09-06 DIAGNOSIS — K922 Gastrointestinal hemorrhage, unspecified: Secondary | ICD-10-CM | POA: Insufficient documentation

## 2023-09-06 DIAGNOSIS — D500 Iron deficiency anemia secondary to blood loss (chronic): Secondary | ICD-10-CM | POA: Insufficient documentation

## 2023-09-06 MED ORDER — IRON SUCROSE 20 MG/ML IV SOLN
200.0000 mg | Freq: Once | INTRAVENOUS | Status: AC
Start: 2023-09-06 — End: 2023-09-06
  Administered 2023-09-06: 200 mg via INTRAVENOUS
  Filled 2023-09-06: qty 10

## 2023-09-06 NOTE — Patient Instructions (Signed)
Iron Sucrose Injection What is this medication? IRON SUCROSE (EYE ern SOO krose) treats low levels of iron (iron deficiency anemia) in people with kidney disease. Iron is a mineral that plays an important role in making red blood cells, which carry oxygen from your lungs to the rest of your body. This medicine may be used for other purposes; ask your health care provider or pharmacist if you have questions. COMMON BRAND NAME(S): Venofer What should I tell my care team before I take this medication? They need to know if you have any of these conditions: Anemia not caused by low iron levels Heart disease High levels of iron in the blood Kidney disease Liver disease An unusual or allergic reaction to iron, other medications, foods, dyes, or preservatives Pregnant or trying to get pregnant Breastfeeding How should I use this medication? This medication is for infusion into a vein. It is given in a hospital or clinic setting. Talk to your care team about the use of this medication in children. While this medication may be prescribed for children as young as 2 years for selected conditions, precautions do apply. Overdosage: If you think you have taken too much of this medicine contact a poison control center or emergency room at once. NOTE: This medicine is only for you. Do not share this medicine with others. What if I miss a dose? Keep appointments for follow-up doses. It is important not to miss your dose. Call your care team if you are unable to keep an appointment. What may interact with this medication? Do not take this medication with any of the following: Deferoxamine Dimercaprol Other iron products This medication may also interact with the following: Chloramphenicol Deferasirox This list may not describe all possible interactions. Give your health care provider a list of all the medicines, herbs, non-prescription drugs, or dietary supplements you use. Also tell them if you smoke,  drink alcohol, or use illegal drugs. Some items may interact with your medicine. What should I watch for while using this medication? Visit your care team regularly. Tell your care team if your symptoms do not start to get better or if they get worse. You may need blood work done while you are taking this medication. You may need to follow a special diet. Talk to your care team. Foods that contain iron include: whole grains/cereals, dried fruits, beans, or peas, leafy green vegetables, and organ meats (liver, kidney). What side effects may I notice from receiving this medication? Side effects that you should report to your care team as soon as possible: Allergic reactions--skin rash, itching, hives, swelling of the face, lips, tongue, or throat Low blood pressure--dizziness, feeling faint or lightheaded, blurry vision Shortness of breath Side effects that usually do not require medical attention (report to your care team if they continue or are bothersome): Flushing Headache Joint pain Muscle pain Nausea Pain, redness, or irritation at injection site This list may not describe all possible side effects. Call your doctor for medical advice about side effects. You may report side effects to FDA at 1-800-FDA-1088. Where should I keep my medication? This medication is given in a hospital or clinic. It will not be stored at home. NOTE: This sheet is a summary. It may not cover all possible information. If you have questions about this medicine, talk to your doctor, pharmacist, or health care provider.  2024 Elsevier/Gold Standard (2023-03-26 00:00:00)

## 2023-10-04 ENCOUNTER — Ambulatory Visit: Payer: 59 | Admitting: Internal Medicine

## 2023-10-04 ENCOUNTER — Telehealth: Payer: Self-pay | Admitting: Student in an Organized Health Care Education/Training Program

## 2023-10-04 ENCOUNTER — Ambulatory Visit: Payer: 59

## 2023-10-04 ENCOUNTER — Other Ambulatory Visit: Payer: 59

## 2023-10-04 NOTE — Telephone Encounter (Signed)
PT called stated that the pharmacy has send over a refill request for her hydrocodone to be refill. Please give patient a call. TY

## 2023-10-04 NOTE — Telephone Encounter (Signed)
Attempted to call [facility], there is a presciption that may be filed today. They are closed until 1500.

## 2023-10-04 NOTE — Telephone Encounter (Signed)
Called [facility], they do have the prescription. Patient notified per voicemail.

## 2023-10-08 ENCOUNTER — Other Ambulatory Visit: Payer: Self-pay | Admitting: Psychiatry

## 2023-10-08 DIAGNOSIS — F418 Other specified anxiety disorders: Secondary | ICD-10-CM

## 2023-10-08 DIAGNOSIS — F3176 Bipolar disorder, in full remission, most recent episode depressed: Secondary | ICD-10-CM

## 2023-10-11 ENCOUNTER — Inpatient Hospital Stay: Payer: 59

## 2023-10-11 ENCOUNTER — Encounter: Payer: Self-pay | Admitting: Internal Medicine

## 2023-10-11 ENCOUNTER — Inpatient Hospital Stay: Payer: 59 | Attending: Internal Medicine

## 2023-10-11 ENCOUNTER — Inpatient Hospital Stay (HOSPITAL_BASED_OUTPATIENT_CLINIC_OR_DEPARTMENT_OTHER): Payer: 59 | Admitting: Internal Medicine

## 2023-10-11 VITALS — BP 122/63 | HR 66 | Temp 97.6°F | Resp 16 | Wt 173.0 lb

## 2023-10-11 DIAGNOSIS — D649 Anemia, unspecified: Secondary | ICD-10-CM

## 2023-10-11 DIAGNOSIS — K922 Gastrointestinal hemorrhage, unspecified: Secondary | ICD-10-CM | POA: Diagnosis present

## 2023-10-11 DIAGNOSIS — Z79899 Other long term (current) drug therapy: Secondary | ICD-10-CM | POA: Diagnosis not present

## 2023-10-11 DIAGNOSIS — D500 Iron deficiency anemia secondary to blood loss (chronic): Secondary | ICD-10-CM

## 2023-10-11 LAB — COMPREHENSIVE METABOLIC PANEL
ALT: 20 U/L (ref 0–44)
AST: 25 U/L (ref 15–41)
Albumin: 3.8 g/dL (ref 3.5–5.0)
Alkaline Phosphatase: 70 U/L (ref 38–126)
Anion gap: 10 (ref 5–15)
BUN: 21 mg/dL (ref 8–23)
CO2: 24 mmol/L (ref 22–32)
Calcium: 9.3 mg/dL (ref 8.9–10.3)
Chloride: 103 mmol/L (ref 98–111)
Creatinine, Ser: 1.06 mg/dL — ABNORMAL HIGH (ref 0.44–1.00)
GFR, Estimated: 58 mL/min — ABNORMAL LOW (ref >60)
Glucose, Bld: 161 mg/dL — ABNORMAL HIGH (ref 70–99)
Potassium: 4.7 mmol/L (ref 3.5–5.1)
Sodium: 137 mmol/L (ref 135–145)
Total Bilirubin: 1 mg/dL (ref <1.2)
Total Protein: 6.7 g/dL (ref 6.5–8.1)

## 2023-10-11 LAB — IRON AND TIBC
Iron: 36 ug/dL (ref 28–170)
Saturation Ratios: 7 % — ABNORMAL LOW (ref 10.4–31.8)
TIBC: 539 ug/dL — ABNORMAL HIGH (ref 250–450)
UIBC: 503 ug/dL

## 2023-10-11 LAB — CBC WITH DIFFERENTIAL/PLATELET
Abs Immature Granulocytes: 0.02 10*3/uL (ref 0.00–0.07)
Basophils Absolute: 0 10*3/uL (ref 0.0–0.1)
Basophils Relative: 1 %
Eosinophils Absolute: 0.1 10*3/uL (ref 0.0–0.5)
Eosinophils Relative: 2 %
HCT: 29.2 % — ABNORMAL LOW (ref 36.0–46.0)
Hemoglobin: 9.2 g/dL — ABNORMAL LOW (ref 12.0–15.0)
Immature Granulocytes: 1 %
Lymphocytes Relative: 23 %
Lymphs Abs: 0.9 10*3/uL (ref 0.7–4.0)
MCH: 26.4 pg (ref 26.0–34.0)
MCHC: 31.5 g/dL (ref 30.0–36.0)
MCV: 83.7 fL (ref 80.0–100.0)
Monocytes Absolute: 0.4 10*3/uL (ref 0.1–1.0)
Monocytes Relative: 10 %
Neutro Abs: 2.6 10*3/uL (ref 1.7–7.7)
Neutrophils Relative %: 63 %
Platelets: 104 10*3/uL — ABNORMAL LOW (ref 150–400)
RBC: 3.49 MIL/uL — ABNORMAL LOW (ref 3.87–5.11)
RDW: 14.7 % (ref 11.5–15.5)
WBC: 4.1 10*3/uL (ref 4.0–10.5)
nRBC: 0 % (ref 0.0–0.2)

## 2023-10-11 LAB — FERRITIN: Ferritin: 21 ng/mL (ref 11–307)

## 2023-10-11 LAB — SAMPLE TO BLOOD BANK

## 2023-10-11 MED ORDER — IRON SUCROSE 20 MG/ML IV SOLN
200.0000 mg | Freq: Once | INTRAVENOUS | Status: AC
Start: 2023-10-11 — End: 2023-10-11
  Administered 2023-10-11: 200 mg via INTRAVENOUS
  Filled 2023-10-11: qty 10

## 2023-10-11 NOTE — Progress Notes (Signed)
[facility] CONSULT NOTE  Patient Care Team: Danella Penton, MD as PCP - General (Internal Medicine) Earna Coder, MD as Consulting Physician (Internal Medicine) Regis Bill, MD as Consulting Physician (Gastroenterology)  CHIEF COMPLAINTS/PURPOSE OF CONSULTATION: Anemia/thrombocytopenia   HEMATOLOGY HISTORY  06/21/2019-06/24/2019 due to severe anemia; hemoglobin of 5.7 upon admission; patient received 2 units of blood transfusion, 3 doses of IV iron. EGD and colonoscopy were performed during the hospitalization EGD and colonoscopy on 06/23/2019. [Dr.Anna; [facility]] EGD showed no abnormalities and the colonoscopy showed 3 sessile diminutive polyps which were resected completely.  Arteries of the duodenum were normal.  3 colon polyps resected were serrated polyp x2 and a tubular adenoma x1. No active source of bleeding was noted.    # Cirrhosis NAFLD- [? [facility]]  HISTORY OF PRESENTING ILLNESS: ambulating with rolling walker [arthritis].   Margaret [name] 67 y.o.  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   Complains of ongoing fatigue.  Denies any blood in stools or black or stools.  No abdominal pain, nausea, or vomiting.    Review of Systems  Constitutional:  Positive for malaise/fatigue. Negative for chills, diaphoresis, fever and weight loss.  HENT:  Negative for nosebleeds and sore throat.   Eyes:  Negative for double vision.  Respiratory:  Negative for cough, hemoptysis, sputum production and wheezing.   Cardiovascular:  Negative for chest pain, palpitations, orthopnea and leg swelling.  Gastrointestinal:  Negative for abdominal pain, blood in stool, constipation, diarrhea, heartburn, melena, nausea and vomiting.  Genitourinary:  Negative for dysuria, frequency and urgency.  Musculoskeletal:  Positive for back  pain and joint pain.  Skin: Negative.  Negative for itching and rash.  Neurological:  Negative for dizziness, tingling, focal weakness, weakness and headaches.  Endo/Heme/Allergies:  Does not bruise/bleed easily.  Psychiatric/Behavioral:  Negative for depression. The patient is not nervous/anxious and does not have insomnia.    MEDICAL HISTORY:  Past Medical History:  Diagnosis Date   Bipolar 1 disorder (HCC)    Depression    Diabetes mellitus without complication (HCC)    Type 2   GERD (gastroesophageal reflux disease)    Hypertension    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H (HCC)    Osteoporosis    SURGICAL HISTORY: Past Surgical History:  Procedure Laterality Date   ABDOMINAL HYSTERECTOMY     CHOLECYSTECTOMY     COLONOSCOPY WITH PROPOFOL N/A 06/23/2019   Procedure: COLONOSCOPY WITH PROPOFOL;  Surgeon: Wyline Mood, MD;  Location: [facility] ENDOSCOPY;  Service: Gastroenterology;  Laterality: N/A;   COLONOSCOPY WITH PROPOFOL N/A 12/08/2022   Procedure: COLONOSCOPY WITH PROPOFOL;  Surgeon: Regis Bill, MD;  Location: [facility] ENDOSCOPY;  Service: Endoscopy;  Laterality: N/A;   COLONOSCOPY WITH PROPOFOL N/A 05/18/2023   Procedure: COLONOSCOPY WITH PROPOFOL;  Surgeon: Regis Bill, MD;  Location: [facility] ENDOSCOPY;  Service: Endoscopy;  Laterality: N/A;   ESOPHAGOGASTRODUODENOSCOPY (EGD) WITH PROPOFOL N/A 06/23/2019   Procedure: ESOPHAGOGASTRODUODENOSCOPY (EGD) WITH PROPOFOL;  Surgeon: Wyline Mood, MD;  Location: [facility] ENDOSCOPY;  Service: Gastroenterology;  Laterality: N/A;   ESOPHAGOGASTRODUODENOSCOPY (EGD) WITH PROPOFOL N/A 12/08/2022   Procedure: ESOPHAGOGASTRODUODENOSCOPY (EGD) WITH PROPOFOL;  Surgeon: Regis Bill, MD;  Location: [facility] ENDOSCOPY;  Service: Endoscopy;  Laterality: N/A;  DM, OZEMPIC   ESOPHAGOGASTRODUODENOSCOPY (EGD) WITH PROPOFOL N/A 01/12/2023   Procedure: ESOPHAGOGASTRODUODENOSCOPY (EGD) WITH PROPOFOL;  Surgeon: Regis Bill, MD;  Location:  [facility]  ENDOSCOPY;  Service: Endoscopy;  Laterality: N/A;  DM   JOINT REPLACEMENT     POLYPECTOMY  05/18/2023   Procedure: POLYPECTOMY;  Surgeon: Regis Bill, MD;  Location: [facility] ENDOSCOPY;  Service: Endoscopy;;   TONSILLECTOMY     SOCIAL HISTORY: Social History   Socioeconomic History   Marital status: Divorced    Spouse name: Not on file   Number of children: 2   Years of education: Not on file   Highest education level: Not on file  Occupational History   Not on file  Tobacco Use   Smoking status: Former    Current packs/day: 0.00    Types: Cigarettes    Quit date: 03/03/2011    Years since quitting: 12.6   Smokeless tobacco: Never  Vaping Use   Vaping status: Never Used  Substance and Sexual Activity   Alcohol use: Yes    Comment: rarely   Drug use: Not Currently   Sexual activity: Not Currently  Other Topics Concern   Not on file  Social History Narrative   Not on file   Social Determinants of Health   Financial Resource Strain: Low Risk  (06/29/2023)   Received from [facility]   Overall Financial Resource Strain (CARDIA)    Difficulty of Paying Living Expenses: Not hard at all  Food Insecurity: No Food Insecurity (06/29/2023)   Received from [facility]   Hunger Vital Sign    Worried About Running Out of Food in the Last Year: Never true    Ran Out of Food in the Last Year: Never true  Transportation Needs: No Transportation Needs (06/29/2023)   Received from [facility] - Transportation    In the past 12 months, has lack of transportation kept you from medical appointments or from getting medications?: No    Lack of Transportation (Non-Medical): No  Physical Activity: Inactive (06/16/2021)   Received from [facility]   Exercise Vital Sign  Stress: No Stress Concern Present (06/16/2021)   Received from [facility] -  Occupational Stress Questionnaire  Social Connections: Socially Isolated (06/16/2021)   Received from [facility]   Social Connection and Isolation Panel [NHANES]  Intimate Partner Violence: Not on file   FAMILY HISTORY: Family History  Problem Relation Age of Onset   Mental illness Father    Bipolar disorder Brother    Bipolar disorder Paternal Uncle    Bipolar disorder Paternal Uncle    ALLERGIES:  is allergic to lithium, penicillins, sulfa antibiotics, and benzonatate.  MEDICATIONS:  Current Outpatient Medications  Medication Sig Dispense Refill   alendronate (FOSAMAX) 35 MG tablet Take 35 mg by mouth every 7 (seven) days. Take with a full glass of water on an empty stomach.     amLODipine (NORVASC) 2.5 MG tablet Take by mouth.     ARIPiprazole (ABILIFY) 10 MG tablet TAKE ONE TABLET BY MOUTH ONE TIME DAILY 90 tablet 1   benztropine (COGENTIN) 0.5 MG tablet Take 1 tablet (0.5 mg total) by mouth daily as needed for tremors. 15 tablet 1   escitalopram (LEXAPRO) 10 MG tablet TAKE ONE TABLET BY MOUTH ONE TIME DAILY 90 tablet 3   furosemide (LASIX) 20 MG tablet Take 10 mg by mouth daily as needed.     glimepiride (AMARYL) 2 MG tablet Take 2 mg by mouth every morning.     HYDROcodone-acetaminophen (  NORCO) 10-325 MG tablet Take 1 tablet by mouth every 8 (eight) hours as needed for severe pain (pain score 7-10). Must last 30 days. 90 tablet 0   [START ON 11/03/2023] HYDROcodone-acetaminophen (NORCO) 10-325 MG tablet Take 1 tablet by mouth every 8 (eight) hours as needed for severe pain (pain score 7-10). Must last 30 days. 90 tablet 0   hydrOXYzine (VISTARIL) 25 MG capsule Take 25 mg by mouth daily as needed for anxiety.     lamoTRIgine (LAMICTAL) 200 MG tablet TAKE ONE TABLET BY MOUTH ONE TIME DAILY 90 tablet 3   meclizine (ANTIVERT) 25 MG tablet Take by mouth 2 (two) times daily as needed.     metFORMIN (GLUCOPHAGE) 500 MG tablet Take 500 mg by mouth 2 (two) times daily with  a meal.     OZEMPIC, 1 MG/DOSE, 4 MG/3ML SOPN Inject 1 mg into the skin once a week.     pantoprazole (PROTONIX) 40 MG tablet Take 40 mg by mouth daily.     polyethylene glycol powder (GLYCOLAX/MIRALAX) 17 GM/SCOOP powder SMARTSIG:17 Gram(s) By Mouth 1-2 Times Daily     spironolactone (ALDACTONE) 25 MG tablet Take 25 mg by mouth daily.     tizanidine (ZANAFLEX) 2 MG capsule Take 2 mg by mouth 3 (three) times daily as needed for muscle spasms. Takes differently     No current facility-administered medications for this visit.     PHYSICAL EXAMINATION: Vitals:   10/11/23 0954  BP: 122/63  Pulse: 66  Resp: 16  Temp: 97.6 F (36.4 C)  SpO2: 99%   Filed Weights   10/11/23 0954  Weight: 173 lb (78.5 kg)   Physical Exam Vitals reviewed.  Constitutional:      Appearance: She is not ill-appearing.  Eyes:     General: No scleral icterus. Cardiovascular:     Rate and Rhythm: Normal rate and regular rhythm.  Pulmonary:     Effort: No respiratory distress.  Abdominal:     General: There is no distension.     Palpations: Abdomen is soft.     Tenderness: There is no abdominal tenderness. There is no guarding.  Skin:    General: Skin is warm and dry.  Neurological:     Mental Status: She is alert and oriented to person, place, and time.  Psychiatric:        Mood and Affect: Mood normal.        Behavior: Behavior normal.      LABORATORY DATA:  I have reviewed the data as listed Lab Results  Component Value Date   WBC 4.1 10/11/2023   HGB 9.2 (L) 10/11/2023   HCT 29.2 (L) 10/11/2023   MCV 83.7 10/11/2023   PLT 104 (L) 10/11/2023   Recent Labs    07/12/23 1416 08/23/23 0844 10/11/23 0926  NA 136 137 137  K 4.6 4.1 4.7  CL 104 102 103  CO2 26 27 24   GLUCOSE 166* 94 161*  BUN 18 19 21   CREATININE 1.18* 1.01* 1.06*  CALCIUM 9.2 9.2 9.3  GFRNONAA 51* >60 58*  PROT 6.5 6.9 6.7  ALBUMIN 3.7 4.1 3.8  AST 29 28 25   ALT 21 18 20   ALKPHOS 80 86 70  BILITOT 0.8 0.6 1.0      No results found.  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  FEB 2024- Iron sat 9 Ferritin 45- wnl-PCP;  Hb 9-10.  s/p IV iron x4 [JAN 2024]- MM panel-WNL.   # Today Hb 9-10- proceed with venofer. #Recommend gentle iron [iron biglycinate; 28 mg ] 1 pill a day.  This pill is unlikely to cause stomach upset or cause constipation.   # Etiology: Cirrhosis/GI bleed FEB 2024- GAVE -non-bleeding-; colo-? Capsule study.  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  Hold off EGD for now-.  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 Stable.   #Liver cirrhosis, splenomegaly, small volume ascites.  Patient does not drink alcohol.? NAFLD-AFP. Patient will need screening ultrasounds [? [facility]]-US- 2024- March - cirrhosis;MAY 2024-MRI per GI - NO malignancy noted. Defer to  re: surveillance.   # Right kidney complex cyst [incidental MAY 2024- MRI]- s/p urology, Dr.Stoioff- MRi pending.    # weight loss sec to oezmpic [40 pounds]- stable. Low concerns for malignancy at this time.   # DISPOSITION: # venofer today; # in 1 week venofer- # in 2 weeks- venofer # follow up in 4 months- MD;  labs- cbc/cmp; iron studies; ferritin-  possible venofer Dr.B   All questions were answered. The patient knows to call the clinic with any problems, questions or concerns.   Earna Coder, MD 10/11/2023

## 2023-10-11 NOTE — Patient Instructions (Signed)
#  Recommend gentle iron [iron biglycinate; 28 mg ] 1 pill a day.  This pill is unlikely to cause stomach upset or cause constipation.  

## 2023-10-11 NOTE — Assessment & Plan Note (Addendum)
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  FEB 2024- Iron sat 9 Ferritin 45- wnl-PCP; Hb 9-10.  s/p IV iron x4 [JAN 2024]- MM panel-WNL.   # Today Hb 9-10- proceed with venofer. #Recommend gentle iron [iron biglycinate; 28 mg ] 1 pill a day.  This pill is unlikely to cause stomach upset or cause constipation.   # Etiology: Cirrhosis/GI bleed FEB 2024- GAVE -non-bleeding-; colo-? Capsule study.  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  Hold off EGD for now-.  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 Stable.   #Liver cirrhosis, splenomegaly, small volume ascites.  Patient does not drink alcohol.? NAFLD-AFP. Patient will need screening ultrasounds [? [facility]]-US- 2024- March - cirrhosis;MAY 2024-MRI per GI - NO malignancy noted. Defer to  re: surveillance.   # Right kidney complex cyst [incidental MAY 2024- MRI]- s/p urology, Dr.Stoioff- MRi pending.    # weight loss sec to oezmpic [40 pounds]- stable. Low concerns for malignancy at this time.   # DISPOSITION: # venofer today; # in 1 week venofer- # in 2 weeks- venofer # follow up in 4 months- MD;  labs- cbc/cmp; iron studies; ferritin-  possible venofer Dr.B

## 2023-10-11 NOTE — Progress Notes (Signed)
Patient states that she knows that she is bleeding from her stomach, so she wants to know what the next step is.

## 2023-10-11 NOTE — Patient Instructions (Signed)
[facility] - A DEPT OF [facility]  Discharge Instructions: Thank you for choosing [facility] to provide your oncology and hematology care.  If you have a lab appointment with the Cancer Center, please go directly to the Cancer Center and check in at the registration area.  Wear comfortable clothing and clothing appropriate for easy access to any Portacath or PICC line.   We strive to give you quality time with your provider. You may need to reschedule your appointment if you arrive late (15 or more minutes).  Arriving late affects you and other patients whose appointments are after yours.  Also, if you miss three or more appointments without notifying the office, you may be dismissed from the clinic at the provider's discretion.      For prescription refill requests, have your pharmacy contact our office and allow 72 hours for refills to be completed.    Today you received the following chemotherapy and/or immunotherapy agents Venofer.      To help prevent nausea and vomiting after your treatment, we encourage you to take your nausea medication as directed.  BELOW ARE SYMPTOMS THAT SHOULD BE REPORTED IMMEDIATELY: *FEVER GREATER THAN 100.4 F (38 C) OR HIGHER *CHILLS OR SWEATING *NAUSEA AND VOMITING THAT IS NOT CONTROLLED WITH YOUR NAUSEA MEDICATION *UNUSUAL SHORTNESS OF BREATH *UNUSUAL BRUISING OR BLEEDING *URINARY PROBLEMS (pain or burning when urinating, or frequent urination) *BOWEL PROBLEMS (unusual diarrhea, constipation, pain near the anus) TENDERNESS IN MOUTH AND THROAT WITH OR WITHOUT PRESENCE OF ULCERS (sore throat, sores in mouth, or a toothache) UNUSUAL RASH, SWELLING OR PAIN  UNUSUAL VAGINAL DISCHARGE OR ITCHING   Items with * indicate a potential emergency and should be followed up as soon as possible or go to the Emergency Department if any problems should occur.  Please show the CHEMOTHERAPY ALERT CARD or IMMUNOTHERAPY  ALERT CARD at check-in to the Emergency Department and triage nurse.  Should you have questions after your visit or need to cancel or reschedule your appointment, please contact [facility] - A DEPT OF [facility]  [phone] and follow the prompts.  Office hours are 8:00 a.m. to 4:30 p.m. Monday - Friday. Please note that voicemails left after 4:00 p.m. may not be returned until the following business day.  We are closed weekends and major holidays. You have access to a nurse at all times for urgent questions. Please call the main number to the clinic [phone] and follow the prompts.  For any non-urgent questions, you may also contact your provider using MyChart. We now offer e-Visits for anyone 70 and older to request care online for non-urgent symptoms. For details visit [email or web address].   Also download the MyChart app! Go to the app store, search "MyChart", open the app, select [place], and log in with your MyChart username and password.

## 2023-10-12 ENCOUNTER — Ambulatory Visit
Admission: RE | Admit: 2023-10-12 | Discharge: 2023-10-12 | Disposition: A | Discharge status: Home / Self Care | Payer: 59 | Source: Ambulatory Visit | Attending: Gastroenterology | Admitting: Gastroenterology

## 2023-10-12 DIAGNOSIS — K7460 Unspecified cirrhosis of liver: Secondary | ICD-10-CM | POA: Insufficient documentation

## 2023-10-15 ENCOUNTER — Other Ambulatory Visit: Payer: Self-pay | Admitting: Gastroenterology

## 2023-10-15 DIAGNOSIS — K7460 Unspecified cirrhosis of liver: Secondary | ICD-10-CM

## 2023-10-15 DIAGNOSIS — R590 Localized enlarged lymph nodes: Secondary | ICD-10-CM

## 2023-10-15 DIAGNOSIS — N281 Cyst of kidney, acquired: Secondary | ICD-10-CM

## 2023-10-18 ENCOUNTER — Ambulatory Visit: Payer: 59

## 2023-10-19 ENCOUNTER — Other Ambulatory Visit: Payer: 59

## 2023-10-19 ENCOUNTER — Inpatient Hospital Stay: Payer: 59

## 2023-10-19 VITALS — BP 130/51 | HR 63 | Temp 98.4°F | Resp 17

## 2023-10-19 DIAGNOSIS — D500 Iron deficiency anemia secondary to blood loss (chronic): Secondary | ICD-10-CM | POA: Diagnosis not present

## 2023-10-19 MED ORDER — IRON SUCROSE 20 MG/ML IV SOLN
200.0000 mg | Freq: Once | INTRAVENOUS | Status: AC
  Administered 2023-10-19: 200 mg via INTRAVENOUS
  Filled 2023-10-19: qty 10

## 2023-10-19 MED ORDER — SODIUM CHLORIDE 0.9% FLUSH
10.0000 mL | Freq: Once | INTRAVENOUS | Status: AC | PRN
  Administered 2023-10-19: 10 mL
  Filled 2023-10-19: qty 10

## 2023-10-25 ENCOUNTER — Ambulatory Visit: Payer: 59

## 2023-10-28 ENCOUNTER — Ambulatory Visit
Admission: RE | Admit: 2023-10-28 | Discharge: 2023-10-28 | Disposition: A | Discharge status: Home / Self Care | Payer: 59 | Source: Ambulatory Visit | Attending: Urology | Admitting: Urology

## 2023-10-28 ENCOUNTER — Other Ambulatory Visit: Payer: 59

## 2023-10-28 DIAGNOSIS — N2889 Other specified disorders of kidney and ureter: Secondary | ICD-10-CM

## 2023-10-28 DIAGNOSIS — C641 Malignant neoplasm of right kidney, except renal pelvis: Secondary | ICD-10-CM | POA: Insufficient documentation

## 2023-10-28 HISTORY — PX: IR RADIOLOGIST EVAL & MGMT: IMG5224

## 2023-10-28 NOTE — Consult Note (Signed)
Chief Complaint: Patient was seen in consultation today for enlarging renal neoplasm at the request of Stoioff,Scott C  Referring Physician(s): Stoioff,Scott C  History of Present Illness: Margaret [name] is a 67 y.o. female with a medical history significant for Elita Boone cirrhosis complicated by portal hypertension who also has a complex cyst arising from the posterior lower pole of the right kidney.  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  She is currently asymptomatic.  She denies flank pain, abdominal pain, dysuria or hematuria.  Past Medical History:  Diagnosis Date   Bipolar 1 disorder (HCC)    Depression    Diabetes mellitus without complication (HCC)    Type 2   GERD (gastroesophageal reflux disease)    Hypertension    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H [facility])    Osteoporosis     Past Surgical History:  Procedure Laterality Date   ABDOMINAL HYSTERECTOMY     CHOLECYSTECTOMY     COLONOSCOPY WITH PROPOFOL N/A 06/23/2019   Procedure: COLONOSCOPY WITH PROPOFOL;  Surgeon: Wyline Mood, MD;  Location: [facility] ENDOSCOPY;  Service: Gastroenterology;  Laterality: N/A;   COLONOSCOPY WITH PROPOFOL N/A 12/08/2022   Procedure: COLONOSCOPY WITH PROPOFOL;  Surgeon: Regis Bill, MD;  Location: [facility] ENDOSCOPY;  Service: Endoscopy;  Laterality: N/A;   COLONOSCOPY WITH PROPOFOL N/A 05/18/2023   Procedure: COLONOSCOPY WITH PROPOFOL;  Surgeon: Regis Bill, MD;  Location: [facility] ENDOSCOPY;  Service: Endoscopy;  Laterality:  N/A;   ESOPHAGOGASTRODUODENOSCOPY (EGD) WITH PROPOFOL N/A 06/23/2019   Procedure: ESOPHAGOGASTRODUODENOSCOPY (EGD) WITH PROPOFOL;  Surgeon: Wyline Mood, MD;  Location: [facility] ENDOSCOPY;  Service: Gastroenterology;  Laterality: N/A;   ESOPHAGOGASTRODUODENOSCOPY (EGD) WITH PROPOFOL N/A 12/08/2022   Procedure: ESOPHAGOGASTRODUODENOSCOPY (EGD) WITH PROPOFOL;  Surgeon: Regis Bill, MD;  Location: [facility] ENDOSCOPY;  Service: Endoscopy;  Laterality: N/A;  DM, OZEMPIC   ESOPHAGOGASTRODUODENOSCOPY (EGD) WITH PROPOFOL N/A 01/12/2023   Procedure: ESOPHAGOGASTRODUODENOSCOPY (EGD) WITH PROPOFOL;  Surgeon: Regis Bill, MD;  Location: [facility] ENDOSCOPY;  Service: Endoscopy;  Laterality: N/A;  DM   IR RADIOLOGIST EVAL & MGMT  10/28/2023   JOINT REPLACEMENT     POLYPECTOMY  05/18/2023   Procedure: POLYPECTOMY;  Surgeon: Regis Bill, MD;  Location: [facility] ENDOSCOPY;  Service: Endoscopy;;   TONSILLECTOMY      Allergies: Lithium, Penicillins, Sulfa antibiotics, and Benzonatate  Medications: Prior to Admission medications   Medication Sig Start Date End Date Taking? Authorizing Provider  alendronate (FOSAMAX) 35 MG tablet Take 35 mg by mouth every 7 (seven) days. Take with a full glass of water on an empty stomach.    [provider]  amLODipine (NORVASC) 2.5 MG tablet Take by mouth.    [provider]  ARIPiprazole (ABILIFY) 10 MG tablet TAKE ONE TABLET BY MOUTH ONE TIME DAILY 08/25/23   Jomarie Longs, MD  benztropine (COGENTIN) 0.5 MG tablet Take 1 tablet (0.5 mg total) by mouth daily as needed for tremors. 10/13/22   Jomarie Longs, MD  escitalopram (LEXAPRO) 10 MG tablet TAKE ONE TABLET BY MOUTH ONE TIME DAILY 10/08/23   Jomarie Longs, MD  furosemide (  LASIX) 20 MG tablet Take 10 mg by mouth daily as needed. 01/30/20   [provider]  glimepiride (AMARYL) 2 MG tablet Take 2 mg by mouth every morning.    [provider]  HYDROcodone-acetaminophen (NORCO)  10-325 MG tablet Take 1 tablet by mouth every 8 (eight) hours as needed for severe pain (pain score 7-10). Must last 30 days. 10/04/23 11/03/23  Edward Jolly, MD  HYDROcodone-acetaminophen (NORCO) 10-325 MG tablet Take 1 tablet by mouth every 8 (eight) hours as needed for severe pain (pain score 7-10). Must last 30 days. 11/03/23 12/03/23  Edward Jolly, MD  hydrOXYzine (VISTARIL) 25 MG capsule Take 25 mg by mouth daily as needed for anxiety. 09/09/21   [provider]  lamoTRIgine (LAMICTAL) 200 MG tablet TAKE ONE TABLET BY MOUTH ONE TIME DAILY 10/08/23   Jomarie Longs, MD  meclizine (ANTIVERT) 25 MG tablet Take by mouth 2 (two) times daily as needed. 05/02/19   [provider]  metFORMIN (GLUCOPHAGE) 500 MG tablet Take 500 mg by mouth 2 (two) times daily with a meal.    [provider]  OZEMPIC, 1 MG/DOSE, 4 MG/3ML SOPN Inject 1 mg into the skin once a week. 12/08/21   [provider]  pantoprazole (PROTONIX) 40 MG tablet Take 40 mg by mouth daily.    [provider]  polyethylene glycol powder (GLYCOLAX/MIRALAX) 17 GM/SCOOP powder SMARTSIG:17 Gram(s) By Mouth 1-2 Times Daily 07/15/22   [provider]  spironolactone (ALDACTONE) 25 MG tablet Take 25 mg by mouth daily.    [provider]  tizanidine (ZANAFLEX) 2 MG capsule Take 2 mg by mouth 3 (three) times daily as needed for muscle spasms. Takes differently    [provider]     Family History  Problem Relation Age of Onset   Mental illness Father    Bipolar disorder Brother    Bipolar disorder Paternal Uncle    Bipolar disorder Paternal Uncle     Social History   Socioeconomic History   Marital status: Divorced    Spouse name: Not on file   Number of children: 2   Years of education: Not on file   Highest education level: Not on file  Occupational History   Not on file  Tobacco Use   Smoking status: Former    Current packs/day: 0.00    Types: Cigarettes    Quit  date: 03/03/2011    Years since quitting: 12.6   Smokeless tobacco: Never  Vaping Use   Vaping status: Never Used  Substance and Sexual Activity   Alcohol use: Yes    Comment: rarely   Drug use: Not Currently   Sexual activity: Not Currently  Other Topics Concern   Not on file  Social History Narrative   Not on file   Social Drivers of Health   Financial Resource Strain: Low Risk  (06/29/2023)   Received from [facility]   Overall Financial Resource Strain (CARDIA)    Difficulty of Paying Living Expenses: Not hard at all  Food Insecurity: No Food Insecurity (06/29/2023)   Received from [facility]   Hunger Vital Sign    Worried About Running Out of Food in the Last Year: Never true    Ran Out of Food in the Last Year: Never true  Transportation Needs: No Transportation Needs (06/29/2023)   Received from [facility]   PRAPARE - Transportation    In the past 12 months, has lack  of transportation kept you from medical appointments or from getting medications?: No    Lack of Transportation (Non-Medical): No  Physical Activity: Inactive (06/16/2021)   Received from [facility]   Exercise Vital Sign  Stress: No Stress Concern Present (06/16/2021)   Received from [facility] of Occupational Health - Occupational Stress Questionnaire  Social Connections: Socially Isolated (06/16/2021)   Received from [facility]   Social Connection and Isolation Panel [NHANES]    ECOG Status: 0 - Asymptomatic  Review of Systems: A 12 point ROS discussed and pertinent positives are indicated in the HPI above.  All other systems are negative.  Review of Systems  Vital Signs: BP (!) 136/44 (BP Location: Left Arm, Patient Position: Sitting, Cuff Size: Normal)   Pulse 63   Temp 98.5 F (36.9 C) (Oral)   Resp 14   SpO2 96%   Advance Care Plan: The advanced care  plan/surrogate decision maker was discussed at the time of visit and the patient did not wish to discuss or was not able to name a surrogate decision maker or provide an advance care plan.    Physical Exam Constitutional:      General: She is not in acute distress.    Appearance: Normal appearance. She is obese.  HENT:     Head: Normocephalic and atraumatic.  Eyes:     General: No scleral icterus. Cardiovascular:     Rate and Rhythm: Normal rate.  Pulmonary:     Effort: Pulmonary effort is normal.  Abdominal:     General: There is no distension.     Palpations: Abdomen is soft.     Tenderness: There is no abdominal tenderness.  Skin:    General: Skin is warm and dry.  Neurological:     Mental Status: She is alert and oriented to person, place, and time.  Psychiatric:        Mood and Affect: Mood normal.        Behavior: Behavior normal.       Imaging: IR Radiologist Eval & Mgmt Result Date: 10/28/2023 EXAM: NEW PATIENT OFFICE VISIT CHIEF COMPLAINT: SEE NOTE IN EPIC HISTORY OF PRESENT ILLNESS: SEE NOTE IN EPIC REVIEW OF SYSTEMS: SEE NOTE IN EPIC PHYSICAL EXAMINATION: SEE NOTE IN EPIC ASSESSMENT AND PLAN: SEE NOTE IN EPIC Electronically Signed   By: Malachy Moan M.D.   On: 10/28/2023 13:07   US Abdomen Complete Result Date: 10/12/2023 CLINICAL DATA:  Cirrhosis. EXAM: ABDOMEN ULTRASOUND COMPLETE COMPARISON:  Ultrasound abdomen 02/09/2023 FINDINGS: Gallbladder: Surgically absent Common bile duct: Diameter: 7.6 mm Liver: Coarsened echogenicity and nodular contour. No focal lesion. Within the porta hepatis there are 2 adjacent 1 cm hypoechoic masses, potentially porta hepatic lymph nodes. Portal vein is patent on color Doppler imaging with normal direction of blood flow towards the liver. IVC: No abnormality visualized. Pancreas: Visualized portion unremarkable. Spleen: Enlarged measuring 17.4 cm Right Kidney: Length: 10.5 cm. Normal renal cortical thickness and echogenicity.  There is a 1.7 cm complex cystic lesion. There is a 6 mm stone. Left Kidney: Length: 10.6 cm. Normal renal cortical thickness and echogenicity. No hydronephrosis. Within the lower pole there is a 9 mm hypoechoic mass, too small to characterize. Abdominal aorta: No aneurysm visualized. Other findings: None. IMPRESSION: 1. Cirrhotic morphology of the liver. No focal lesion. 2. Splenomegaly. 3. There are 2 adjacent 1 cm hypoechoic masses within the porta hepatis, potentially porta hepatic lymph nodes. Recommend attention  on follow-up. 4. There is a 1.7 cm complex cystic lesion within the right kidney. Recommend follow-up renal protocol CT or MRI in 6 months. 5. Nonobstructing right renal stone. Electronically Signed   By: Annia Belt M.D.   On: 10/12/2023 10:52    Labs:  CBC: Recent Labs    06/21/23 1413 07/12/23 1416 08/23/23 0844 10/11/23 0926  WBC 5.4 4.6 4.8 4.1  HGB 8.9* 9.4* 10.0* 9.2*  HCT 29.2* 30.7* 31.4* 29.2*  PLT 109* 115* 119* 104*    COAGS: Recent Labs    01/08/23 1445 01/19/23 1248 01/29/23 1246 04/05/23 1034  INR 1.4* 1.3*  --  1.2  APTT  --   --  32 29    BMP: Recent Labs    06/07/23 1321 07/12/23 1416 08/23/23 0844 10/11/23 0926  NA 133* 136 137 137  K 4.2 4.6 4.1 4.7  CL 101 104 102 103  CO2 23 26 27 24   GLUCOSE 132* 166* 94 161*  BUN 18 18 19 21   CALCIUM 9.0 9.2 9.2 9.3  CREATININE 1.23* 1.18* 1.01* 1.06*  GFRNONAA 49* 51* >60 58*    LIVER FUNCTION TESTS: Recent Labs    06/07/23 1321 07/12/23 1416 08/23/23 0844 10/11/23 0926  BILITOT 0.8 0.8 0.6 1.0  AST 29 29 28 25   ALT 20 21 18 20   ALKPHOS 79 80 86 70  PROT 6.9 6.5 6.9 6.7  ALBUMIN 4.0 3.7 4.1 3.8    TUMOR MARKERS: No results for input(s): "AFPTM", "CEA", "CA199", "CHROMGRNA" in the last 8760 hours.  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  We discussed the options of surveillance imaging versus proceeding with treatment.  Based she is a poor surgical candidate given her underlying and considered pacemaker as a separate Nash cirrhosis and portal hypertension.  However, she is an excellent candidate for percutaneous thermal ablation.  Given that the lesion has already demonstrated some growth she is not inclined to pursue further surveillance at this time.  She would like to proceed with percutaneous microwave ablation.  I believe this is the sound and reasonable decision.  1.) Please schedule for CT guided microwave ablation at [facility]  Thank you for this interesting consult.  I greatly enjoyed meeting [facility] and look forward to participating in their care.  A copy of this report was sent to the requesting provider on this date.  Electronically Signed: Sterling Big 10/28/2023, 1:32 PM   I spent a total of 40 Minutes  in face to face in clinical consultation, greater than 50% of which was counseling/coordinating care for right renal neoplasm

## 2023-11-05 ENCOUNTER — Inpatient Hospital Stay: Payer: 59 | Attending: Internal Medicine

## 2023-11-05 VITALS — BP 103/40 | HR 65 | Temp 97.4°F | Resp 18

## 2023-11-05 DIAGNOSIS — D500 Iron deficiency anemia secondary to blood loss (chronic): Secondary | ICD-10-CM | POA: Diagnosis present

## 2023-11-05 DIAGNOSIS — K922 Gastrointestinal hemorrhage, unspecified: Secondary | ICD-10-CM | POA: Insufficient documentation

## 2023-11-05 MED ORDER — IRON SUCROSE 20 MG/ML IV SOLN
200.0000 mg | Freq: Once | INTRAVENOUS | Status: AC
  Administered 2023-11-05: 200 mg via INTRAVENOUS

## 2023-11-05 NOTE — Patient Instructions (Signed)
 Iron Sucrose Injection What is this medication? IRON SUCROSE (EYE ern SOO krose) treats low levels of iron (iron deficiency anemia) in people with kidney disease. Iron is a mineral that plays an important role in making red blood cells, which carry oxygen from your lungs to the rest of your body. This medicine may be used for other purposes; ask your health care provider or pharmacist if you have questions. COMMON BRAND NAME(S): Venofer What should I tell my care team before I take this medication? They need to know if you have any of these conditions: Anemia not caused by low iron levels Heart disease High levels of iron in the blood Kidney disease Liver disease An unusual or allergic reaction to iron, other medications, foods, dyes, or preservatives Pregnant or trying to get pregnant Breastfeeding How should I use this medication? This medication is for infusion into a vein. It is given in a hospital or clinic setting. Talk to your care team about the use of this medication in children. While this medication may be prescribed for children as young as 2 years for selected conditions, precautions do apply. Overdosage: If you think you have taken too much of this medicine contact a poison control center or emergency room at once. NOTE: This medicine is only for you. Do not share this medicine with others. What if I miss a dose? Keep appointments for follow-up doses. It is important not to miss your dose. Call your care team if you are unable to keep an appointment. What may interact with this medication? Do not take this medication with any of the following: Deferoxamine Dimercaprol Other iron products This medication may also interact with the following: Chloramphenicol Deferasirox This list may not describe all possible interactions. Give your health care provider a list of all the medicines, herbs, non-prescription drugs, or dietary supplements you use. Also tell them if you smoke,  drink alcohol, or use illegal drugs. Some items may interact with your medicine. What should I watch for while using this medication? Visit your care team regularly. Tell your care team if your symptoms do not start to get better or if they get worse. You may need blood work done while you are taking this medication. You may need to follow a special diet. Talk to your care team. Foods that contain iron include: whole grains/cereals, dried fruits, beans, or peas, leafy green vegetables, and organ meats (liver, kidney). What side effects may I notice from receiving this medication? Side effects that you should report to your care team as soon as possible: Allergic reactions--skin rash, itching, hives, swelling of the face, lips, tongue, or throat Low blood pressure--dizziness, feeling faint or lightheaded, blurry vision Shortness of breath Side effects that usually do not require medical attention (report to your care team if they continue or are bothersome): Flushing Headache Joint pain Muscle pain Nausea Pain, redness, or irritation at injection site This list may not describe all possible side effects. Call your doctor for medical advice about side effects. You may report side effects to FDA at 1-800-FDA-1088. Where should I keep my medication? This medication is given in a hospital or clinic. It will not be stored at home. NOTE: This sheet is a summary. It may not cover all possible information. If you have questions about this medicine, talk to your doctor, pharmacist, or health care provider.  2024 Elsevier/Gold Standard (2023-03-26 00:00:00)

## 2023-11-09 ENCOUNTER — Other Ambulatory Visit: Payer: Self-pay | Admitting: Interventional Radiology

## 2023-11-09 DIAGNOSIS — N2889 Other specified disorders of kidney and ureter: Secondary | ICD-10-CM

## 2023-11-10 ENCOUNTER — Ambulatory Visit: Payer: 59

## 2023-11-10 ENCOUNTER — Inpatient Hospital Stay: Admission: RE | Admit: 2023-11-10 | Payer: 59 | Source: Ambulatory Visit

## 2023-11-11 ENCOUNTER — Encounter
Admission: RE | Admit: 2023-11-11 | Discharge: 2023-11-11 | Disposition: A | Discharge status: Home / Self Care | Payer: 59 | Source: Ambulatory Visit | Attending: Interventional Radiology | Admitting: Interventional Radiology

## 2023-11-11 VITALS — Ht 60.0 in | Wt 173.1 lb

## 2023-11-11 DIAGNOSIS — Z01818 Encounter for other preprocedural examination: Secondary | ICD-10-CM

## 2023-11-11 DIAGNOSIS — Z01810 Encounter for preprocedural cardiovascular examination: Secondary | ICD-10-CM

## 2023-11-11 DIAGNOSIS — E119 Type 2 diabetes mellitus without complications: Secondary | ICD-10-CM

## 2023-11-11 DIAGNOSIS — I10 Essential (primary) hypertension: Secondary | ICD-10-CM

## 2023-11-11 HISTORY — DX: Dependence on other enabling machines and devices: Z99.89

## 2023-11-11 NOTE — Patient Instructions (Addendum)
 Your procedure is scheduled on:11-22-23 Monday Report to the [facility]-Arrive as instructed by office 7:30 am  REMEMBER: Instructions that are not followed completely may result in serious medical risk, up to and including death; or upon the discretion of your surgeon and anesthesiologist your surgery may need to be rescheduled.  Do not eat food OR drink any liquids after midnight the night before surgery.  No gum chewing or hard candies.  One week prior to surgery:starting 11-15-23 Stop Anti-inflammatories (NSAIDS) such as Advil, Aleve, Ibuprofen, Motrin, Naproxen, Naprosyn and Aspirin based products such as Excedrin, Goody's Powder, BC Powder. Stop ANY OVER THE COUNTER supplements until after surgery.  You may however, continue to take Tylenol  if needed for pain up until the day of surgery.  Stop OZEMPIC 7 days prior to surgery-Last dose will be on 11-14-23 (Sunday)-Do NOT take again until AFTER surgery  Stop metFORMIN (GLUCOPHAGE) 2 days prior to surgery-Last dose will be on 11-19-23 Friday  Continue taking all of your other prescription medications up until the day of surgery.  ON THE DAY OF SURGERY ONLY TAKE THESE MEDICATIONS WITH SIPS OF WATER : -amLODipine  (NORVASC )  -HYDROcodone -acetaminophen  (NORCO)  -meclizine (ANTIVERT)  -pantoprazole  (PROTONIX )   No Alcohol for 24 hours before or after surgery.  No Smoking including e-cigarettes for 24 hours before surgery.  No chewable tobacco products for at least 6 hours before surgery.  No nicotine patches on the day of surgery.  Do not use any recreational drugs for at least a week (preferably 2 weeks) before your surgery.  Please be advised that the combination of cocaine and anesthesia may have negative outcomes, up to and including death. If you test positive for cocaine, your surgery will be cancelled.  On the morning of surgery brush your teeth with toothpaste and water , you may rinse your mouth with mouthwash  if you wish. Do not swallow any toothpaste or mouthwash.  Use CHG Soap as directed on instruction sheet.  Do not wear jewelry, make-up, hairpins, clips or nail polish.  For welded (permanent) jewelry: bracelets, anklets, waist bands, etc.  Please have this removed prior to surgery.  If it is not removed, there is a chance that hospital personnel will need to cut it off on the day of surgery.  Do not wear lotions, powders, or perfumes.   Do not shave body hair from the neck down 48 hours before surgery.  Contact lenses, hearing aids and dentures may not be worn into surgery.  Do not bring valuables to the hospital. [facility] is not responsible for any missing/lost belongings or valuables.   Notify your doctor if there is any change in your medical condition (cold, fever, infection).  Wear comfortable clothing (specific to your surgery type) to the hospital.  After surgery, you can help prevent lung complications by doing breathing exercises.  Take deep breaths and cough every 1-2 hours. Your doctor may order a device called an Incentive Spirometer to help you take deep breaths. When coughing or sneezing, hold a pillow firmly against your incision with both hands. This is called "splinting." Doing this helps protect your incision. It also decreases belly discomfort.  If you are being admitted to the hospital overnight, leave your suitcase in the car. After surgery it may be brought to your room.  In case of increased patient census, it may be necessary for you, the patient, to continue your postoperative care in the Same Day Surgery department.  If you are being discharged the  day of surgery, you will not be allowed to drive home. You will need a responsible individual to drive you home and stay with you for 24 hours after surgery.   If you are taking public transportation, you will need to have a responsible individual with you.  Please call the Pre-admissions Testing Dept. at  [phone] if you have any questions about these instructions.  Surgery Visitation Policy:  Patients having surgery or a procedure may have two visitors.  Children under the age of 56 must have an adult with them who is not the patient.     Preparing for Surgery with CHLORHEXIDINE  GLUCONATE (CHG) Soap  Chlorhexidine  Gluconate (CHG) Soap  o An antiseptic cleaner that kills germs and bonds with the skin to continue killing germs even after washing  o Used for showering the night before surgery and morning of surgery  Before surgery, you can play an important role by reducing the number of germs on your skin.  CHG (Chlorhexidine  gluconate) soap is an antiseptic cleanser which kills germs and bonds with the skin to continue killing germs even after washing.  Please do not use if you have an allergy to CHG or antibacterial soaps. If your skin becomes reddened/irritated stop using the CHG.  1. Shower the NIGHT BEFORE SURGERY and the MORNING OF SURGERY with CHG soap.  2. If you choose to wash your hair, wash your hair first as usual with your normal shampoo.  3. After shampooing, rinse your hair and body thoroughly to remove the shampoo.  4. Use CHG as you would any other liquid soap. You can apply CHG directly to the skin and wash gently with a scrungie or a clean washcloth.  5. Apply the CHG soap to your body only from the neck down. Do not use on open wounds or open sores. Avoid contact with your eyes, ears, mouth, and genitals (private parts). Wash face and genitals (private parts) with your normal soap.  6. Wash thoroughly, paying special attention to the area where your surgery will be performed.  7. Thoroughly rinse your body with warm water .  8. Do not shower/wash with your normal soap after using and rinsing off the CHG soap.  9. Pat yourself dry with a clean towel.  10. Wear clean pajamas to bed the night before surgery.  12. Place clean sheets on your bed the night  of your first shower and do not sleep with pets.  13. Shower again with the CHG soap on the day of surgery prior to arriving at the hospital.  14. Do not apply any deodorants/lotions/powders.  15. Please wear clean clothes to the hospital.

## 2023-11-15 ENCOUNTER — Ambulatory Visit
Admission: RE | Admit: 2023-11-15 | Discharge: 2023-11-15 | Disposition: A | Discharge status: Home / Self Care | Payer: 59 | Source: Ambulatory Visit | Attending: Gastroenterology | Admitting: Gastroenterology

## 2023-11-15 ENCOUNTER — Encounter
Admission: RE | Admit: 2023-11-15 | Discharge: 2023-11-15 | Disposition: A | Discharge status: Home / Self Care | Payer: 59 | Source: Ambulatory Visit | Attending: Interventional Radiology | Admitting: Interventional Radiology

## 2023-11-15 DIAGNOSIS — R590 Localized enlarged lymph nodes: Secondary | ICD-10-CM | POA: Insufficient documentation

## 2023-11-15 DIAGNOSIS — Z01810 Encounter for preprocedural cardiovascular examination: Secondary | ICD-10-CM

## 2023-11-15 DIAGNOSIS — R9431 Abnormal electrocardiogram [ECG] [EKG]: Secondary | ICD-10-CM

## 2023-11-15 DIAGNOSIS — I10 Essential (primary) hypertension: Secondary | ICD-10-CM | POA: Insufficient documentation

## 2023-11-15 DIAGNOSIS — I509 Heart failure, unspecified: Secondary | ICD-10-CM

## 2023-11-15 DIAGNOSIS — Z794 Long term (current) use of insulin: Secondary | ICD-10-CM | POA: Insufficient documentation

## 2023-11-15 DIAGNOSIS — K7460 Unspecified cirrhosis of liver: Secondary | ICD-10-CM | POA: Insufficient documentation

## 2023-11-15 DIAGNOSIS — I350 Nonrheumatic aortic (valve) stenosis: Secondary | ICD-10-CM

## 2023-11-15 DIAGNOSIS — N281 Cyst of kidney, acquired: Secondary | ICD-10-CM | POA: Insufficient documentation

## 2023-11-15 DIAGNOSIS — E119 Type 2 diabetes mellitus without complications: Secondary | ICD-10-CM | POA: Insufficient documentation

## 2023-11-15 HISTORY — DX: Unspecified diastolic (congestive) heart failure: I50.30

## 2023-11-15 MED ORDER — GADOBUTROL 1 MMOL/ML IV SOLN
7.0000 mL | Freq: Once | INTRAVENOUS | Status: AC | PRN
  Administered 2023-11-15: 7 mL via INTRAVENOUS

## 2023-11-15 NOTE — Progress Notes (Addendum)
 Perioperative Services Pre-Admission/Anesthesia Testing    Date: 11/15/23  Name: Margaret [name] MRN:   [id number]  Re: Need for cardiovascular evaluation and clearance  Planned Surgical Procedure(s):    Date/Time: 11/22/23 0830   Procedure: CT RENAL TUMOR ABLATION UNILATERAL   Diagnosis: Right renal mass [N28.89]   Indications: Ct guided MWA right renal mass   Location: [facility] CT [facility]      Clinical Notes:  Patient is scheduled for the above procedure on 11/22/2023 with Dr. Wilkie Drivers, MD.  Preparation for procedure, patient presented to the PAT clinic on the morning of 11/15/2023 for preoperative testing.  In review of her preoperative ECG, patient with abnormal ECG findings; TWIs in leads III and aVF and nonspecific ST/T wave abnormalities in leads V3-V6.  Last EKG on file for review was performed on 12/01/2019.  Patient did have some nonspecific ST and T wave changes at that time in the precordial leads, however inferior changes appear to be new.  EMR reviewed, patient did not have any cardiovascular history listed. Through further review of her medical history, I did find that patient does have a CHF diagnosis.  I do not see where patient is currently being followed by cardiology.  She did have a transthoracic echocardiogram back on 12/15/2022 that demonstrated grade 1 diastolic dysfunction.  Additionally, study indicated moderate aortic valve stenosis with a mean transvalvular gradient of 20 mmHg; AVA (VTI) = 1.7 cm.   Discussed case with attending anesthesiologist on-call Alm, MD).  Given ECG changes, and fairly recent documented history of aortic stenosis, patient will need to be seen by cardiology service line prior to proceeding with planned tumor ablation with interventional radiology.   ECG:    Assessment and Plan:  Margaret [name] is scheduled to undergo tumor ablation with IR in the near future. Pre-procedural anesthesia work-up  revealed ECG changes. Patient with CHF diagnosis and also moderate aortic stenosis. Unsure if proceduralist is aware of these diagnoses. Patient does not see cardiology. Case has been discussed with attending anesthesiologist today and request made for patient to be evaluated and cleared prior to procedure.   Sent patient a MyChart message, however I didn't hear back. Staff in my office attempted to call her today, however were unable to speak with patient. Spoke with daughter-in-law (Amy) who advised that she will relay the message to the patient. Family member asking for patient to see Dr. Gollan. I will send that referral in today.  No changes are being made to the IR schedule for her procedure at this time. Will ask if patient can be seen in expedited consult by Dr. Gollan per family request. May have to postpone procedure if Dr. Gollan does not have any availability. Daughter in law asked to be keep in the loop regarding appointment, as she works for [employer] and can potentially have patient seen there, however her preference is Dr. Gollan. Will forward copy of note to proceduralist as well to make him aware of ECG changes, cardiovascular history, and request from anesthesia for further evaluation and clearance prior to proceeding.   Orders Placed This Encounter  Procedures   Ambulatory referral to Cardiology    Referral Priority:   Routine    Referral Type:   Consultation    Referral Reason:   Specialty Services Required    Referred to Provider:   Perla Evalene PARAS, MD    Number of Visits Requested}:   1    Notes: EKG changes. CHF + aortic  stenosis. Does not follow with cards. Family works at [facility], however they are specifically requesting Dr. Gollan if possible. Copy of my note forwarded to Dr. Tresia basket. Patient is scheduled for CT guided renal tumor ablation with IR on 01/20. Hoping to be able to have her seen and cleared for the procedure. If Dr. Gollan is unable to see patient,  please let me know. I will follow up with the family member, as she may elect to try to get patient in at [facility]. Please let me know ASAP so that I can keep everyone involved up to date on plans.    Encounter Diagnoses  Name Primary?   Pre-operative cardiovascular examination Yes   EKG abnormalities    CHF (congestive heart failure) (HCC)    Aortic stenosis    ADDENDUM 11/16/2023 at 0905 AM: No availability with Dr. Gollan until 01/28/2024 per office. With that said, Dr. Anner has the first available appointment coming up on 11/18/2023. Family unfamiliar with this provider. I discussed the opening and managed up Dr. Anner has being an excellent provider from our cardiology partners in [place]. They are excited to meet him and hopefully get patient seen and cleared in time for her procedure with IR.   I forward copy of this note to Dr. Anner to make him aware of concerns and need for cardiovascular clearance for general anesthesia. Will make plans to follow up on the plan of care from cardiology after her appointment on 11/18/2023 at 0940 AM.   Dorise Pereyra, MSN, APRN, FNP-C, CEN [facility] where it was only come in here I disinterested yeah they look Perioperative Services Nurse Practitioner Phone: [phone] Fax: [phone] 11/15/23 5:32 PM  NOTE: This note has been prepared using Dragon dictation software. Despite my best ability to proofread, there is always the potential that unintentional transcriptional errors may still occur from this process.

## 2023-11-16 ENCOUNTER — Inpatient Hospital Stay
Admission: RE | Admit: 2023-11-16 | Discharge: 2023-11-16 | Disposition: A | Discharge status: Home / Self Care | Payer: 59 | Source: Ambulatory Visit

## 2023-11-16 HISTORY — DX: Type 2 diabetes mellitus without complications: E11.9

## 2023-11-16 HISTORY — DX: Iron deficiency anemia, unspecified: D50.9

## 2023-11-16 HISTORY — DX: Repeated falls: R29.6

## 2023-11-16 HISTORY — DX: Venous insufficiency (chronic) (peripheral): I87.2

## 2023-11-16 HISTORY — DX: Other specified disorders of kidney and ureter: N28.89

## 2023-11-16 HISTORY — DX: Cardiac murmur, unspecified: R01.1

## 2023-11-16 HISTORY — DX: Personal history of nicotine dependence: Z87.891

## 2023-11-16 HISTORY — DX: Personal history of other diseases of the digestive system: Z87.19

## 2023-11-16 HISTORY — DX: Thrombocytopenia, unspecified: D69.6

## 2023-11-16 HISTORY — DX: Tremor, unspecified: R25.1

## 2023-11-16 HISTORY — DX: Personal history of other medical treatment: Z92.89

## 2023-11-17 ENCOUNTER — Encounter: Payer: Self-pay | Admitting: Internal Medicine

## 2023-11-18 ENCOUNTER — Encounter: Payer: Self-pay | Admitting: Internal Medicine

## 2023-11-18 ENCOUNTER — Encounter: Payer: Self-pay | Admitting: Cardiology

## 2023-11-18 ENCOUNTER — Ambulatory Visit: Payer: 59 | Attending: Cardiology | Admitting: Cardiology

## 2023-11-18 VITALS — BP 128/60 | HR 61 | Ht 60.0 in | Wt 170.2 lb

## 2023-11-18 DIAGNOSIS — I10 Essential (primary) hypertension: Secondary | ICD-10-CM

## 2023-11-18 DIAGNOSIS — Z794 Long term (current) use of insulin: Secondary | ICD-10-CM | POA: Diagnosis not present

## 2023-11-18 DIAGNOSIS — Z01810 Encounter for preprocedural cardiovascular examination: Secondary | ICD-10-CM | POA: Diagnosis not present

## 2023-11-18 DIAGNOSIS — R9431 Abnormal electrocardiogram [ECG] [EKG]: Secondary | ICD-10-CM | POA: Insufficient documentation

## 2023-11-18 DIAGNOSIS — E119 Type 2 diabetes mellitus without complications: Secondary | ICD-10-CM

## 2023-11-18 DIAGNOSIS — E1169 Type 2 diabetes mellitus with other specified complication: Secondary | ICD-10-CM

## 2023-11-18 DIAGNOSIS — E785 Hyperlipidemia, unspecified: Secondary | ICD-10-CM

## 2023-11-18 DIAGNOSIS — I350 Nonrheumatic aortic (valve) stenosis: Secondary | ICD-10-CM

## 2023-11-18 NOTE — Assessment & Plan Note (Signed)
A1c 5.5.  Had previously been 7.4 over a year ago.  Is now on Amaryl and Ozempic.  Not on insulin.

## 2023-11-18 NOTE — Assessment & Plan Note (Signed)
Margaret [name] perioperative risk of a major cardiac event is 0.4% according to the Revised Cardiac Risk Index (RCRI).  Therefore, she is at low risk for perioperative complications.   Her functional capacity is fair, but mostly limited by MSK issues at 4.64 METs according to the Duke Activity Status Index (DASI). Recommendations: According to ACC/AHA guidelines, no further cardiovascular testing needed.  The patient may proceed to surgery at acceptable risk.  She had an echo done in March 2024 which did show moderate aortic stenosis with a gradient of 20 mmHg.  She is asymptomatic.  Guideline recommendation will be to reassess in 3 years. Antiplatelet and/or Anticoagulation Recommendations: If necessary, okay to hold aspirin Not on anticoagulation

## 2023-11-18 NOTE — Assessment & Plan Note (Signed)
Most recent LDL was 74.  We talked about baseline cardiovascular risk evaluation which is warranted, but does not need to be done prior to her upcoming procedure. Will see her back in a few months after her procedure and we can talk about potentially checking Coronary Calcium Score to determine goals of care.

## 2023-11-18 NOTE — Patient Instructions (Addendum)
Medication Instructions:  - Your physician recommends that you continue on your current medications as directed. Please refer to the Current Medication list given to you today.  *If you need a refill on your cardiac medications before your next appointment, please call your pharmacy*   Lab Work: - none ordered  If you have labs (blood work) drawn today and your tests are completely normal, you will receive your results only by: MyChart Message (if you have MyChart) OR A paper copy in the mail If you have any lab test that is abnormal or we need to change your treatment, we will call you to review the results.   Testing/Procedures: - none ordered   Follow-Up: At [facility], you and your health needs are our priority.  As part of our continuing mission to provide you with exceptional heart care, we have created designated Provider Care Teams.  These Care Teams include your primary Cardiologist (physician) and Advanced Practice Providers (APPs -  Physician Assistants and Nurse Practitioners) who all work together to provide you with the care you need, when you need it.  We recommend signing up for the patient portal called "MyChart".  Sign up information is provided on this After Visit Summary.  MyChart is used to connect with patients for Virtual Visits (Telemedicine).  Patients are able to view lab/test results, encounter notes, upcoming appointments, etc.  Non-urgent messages can be sent to your provider as well.   To learn more about what you can do with MyChart, go to [email or web address].    Your next appointment:   3 month(s)  Provider:   Bryan Lemma, MD    Other Instructions N/a

## 2023-11-18 NOTE — Progress Notes (Signed)
Cardiology Office Note:  .   Date:  11/18/2023  ID:  Margaret [name], DOB 03-02-1956, MRN [id number] PCP: Danella Penton, MD  [facility] Providers Cardiologist:  None     Chief Complaint  Patient presents with   New Patient (Initial Visit)    Cardiac clearance for a CT RENAL TUMOR ABLATION UNILATERAL that is scheduled for Monday, 11/22/2023. Patient was at pre-op for the above procedure and her EKG was abnormal.     Patient Profile: .     Margaret [name] is a  68 y.o. female former heavy smoker of 2 packs a day with a PMH notable for HTN, HLD, DM type II, CKD 3 GERD, NASH, and Right Renal Tumor who presents here for Preop Cardiovascular Evaluation at the request of Margaret Monte, NP.    Margaret [name] was seen by her PCP on October 28, 2023 for routine follow-up after a fall where she hurt her left hip, right knee and left foot.  Cardiac review of symptoms were negative at that time.  As she was still weightbearing, did not feel like fracture was likely and therefore treated symptomatically.  She was scheduled for CT Guided Renal Tumor Ablation.  She was seen in preop testing on 11/15/2023 and her EKG showed asymmetric T wave inversions in III aVF as well as V3 through V6 apparently not seen in 2021.  There is also a chart biopsy which indicated "CHF diagnosis ".  Not followed by cardiologist.  Echocardiogram apparently showed moderate aortic valve stenosis with a mean AVG of 20 mmHg and AVA of 1.7 cm. ->  Based on these data, the case was reviewed by the anesthesiologist, and she was delayed to be seen by cardiology.   Subjective  Discussed the use of AI scribe software for clinical note transcription with the patient, who gave verbal consent to proceed.  History of Present Illness   The patient, a 68 year old with a history of high blood pressure, cholesterol, and diabetes, presented for a consultation regarding an abnormal EKG. The patient's most recent A1c was 5.5, indicating  well-controlled diabetes. The patient reported no symptoms of chest pain, pressure, or shortness of breath, which could be indicative of angina or heart failure. The patient's mobility is limited due to a hip replacement surgery in 2018, which may affect the assessment of exertional symptoms.  The patient has a history of plantar fasciitis and has been experiencing ankle swelling, for which they take a diuretic (PRN Lasix). The patient also reported a history of frequent tonsillitis during childhood. The patient has been receiving iron infusions every three weeks due to anemia, likely secondary to a gastrointestinal bleed that was treated last year.  She had an Echocardiogram in March 2024 showing Mod AS --> This was a new finding, and the patient was not previously aware of this diagnosis. The patient's echocardiogram from last year showed normal left ventricular function and a normal right ventricle. The patient's ankle swelling was noted to be chronic and possibly related to poor venous circulation & not associated with PND or Orthopnea.  The patient has a history of non-alcoholic fatty liver disease and has been experiencing some right ankle swelling. The patient also reported a history of a hip replacement surgery in 2018, which has limited their mobility. The patient has been managing their diabetes with metformin and Ozempic, and has lost 30 pounds. The patient also reported a history of iron deficiency anemia, which is currently being managed with iron infusions every  three weeks.      Cardiovascular ROS: no chest pain or dyspnea on exertion positive for - mild right leg swelling on occasion.  Pretty well-controlled with Lasix and foot elevation.  Not associate with PND or orthopnea.  Exercise limited by hip and leg pain-also poor balance. negative for - irregular heartbeat, orthopnea, palpitations, paroxysmal nocturnal dyspnea, rapid heart rate, shortness of breath, or lightheadedness, syncope or  near STEMI, TIA or RCVS, claudication  ROS:  Review of Systems - Negative except symptoms noted in HPI    Objective   Studies Reviewed: Marland Kitchen   EKG Interpretation Date/Time:  Thursday November 18 2023 10:20:50 EST Ventricular Rate:  61 PR Interval:  160 QRS Duration:  70 QT Interval:  428 QTC Calculation: 430 R Axis:   7  Text Interpretation: Normal sinus rhythm Nonspecific ST abnormality When compared with ECG of 15-Nov-2023 09:12, Borderline criteria for Inferior infarct are no longer Present T wave inversion no longer evident in Inferior leads T wave inversion no longer evident in Lateral leads Confirmed by Margaret [name] ([id number]) on 11/18/2023 10:26:35 AM    ECHO 12/15/2022 [facility]): Normal LV size and function with an EF> 55%.  Normal wall motion.  Mild LVH with GR 1 DD and mild LA dilation.  Moderate calcific AS-mean AVG 20 mmHg  Component 10/25/23 06/22/23 02/09/23 10/06/22  Cholesterol, Total 142 143 138 138  Triglyceride 86 120 84 97  HDL (High Density Lipoprotein) Cholesterol 51 47.9 54.1 47.9  LDL Calculated 74 71 67 71  Hgb A1c 5.5 6.7 6.0 7.4   Component 10/25/23 06/22/23 02/09/23 10/27/22  WBC (White Blood Cell Count) 6.2 4.5 4.5 8.9  RBC (Red Blood Cell Count) 3.77 Low  3.70 Low  3.87 Low  3.59 Low   Hemoglobin 10.3 Low  9.0 Low  9.8 Low  9.3 Low   Hematocrit 32.2 Low  29.5 Low  31.8 Low  29.7 Low   MCV (Mean Corpuscular Volume) 85.4 79.7 Low  82.2 82.7  MCH (Mean Corpuscular Hemoglobin) 27.3 24.3 Low  25.3 Low  25.9 Low   MCHC (Mean Corpuscular Hemoglobin Concentration) 32 30.5 Low  30.8 Low  31.3 Low   Platelet Count 127 Low  117 Low  122 Low  129 Low    Component 10/25/23 06/22/23 02/09/23 10/06/22  Glucose 151 High  141 High  99 107  Sodium 137 140 141 142  Potassium 5 4.4 4.5 4.1  Chloride 98 102 104 104  Carbon Dioxide (CO2) 28.1 27.3 31.1 26.1  Urea Nitrogen (BUN) 20 23 17 15   Creatinine 1.2 High  1.3 High  1.1 1.0  Glomerular  Filtration Rate (eGFR) 50 Low   46 Low   56 Low   63   Calcium 10 9.8 9.6 9.2  AST 25 25 27 31   ALT 17 17 20 23   Alk Phos (alkaline Phosphatase) 88 84 88 83  Albumin 4.4 4.2 4.2 3.9  Bilirubin, Total 1.1 0.9 0.8 1.0  Protein, Total 6.4 6.6 6.8 6.3    Risk Assessment/Calculations:                Physical Exam:   VS:  BP 128/60 (BP Location: Right Arm, Patient Position: Sitting, Cuff Size: Normal)   Pulse 61   Ht 5' (1.524 m)   Wt 170 lb 4 oz (77.2 kg)   SpO2 98%   BMI 33.25 kg/m    Wt Readings from Last 3 Encounters:  11/18/23 170 lb 4 oz (77.2 kg)  11/11/23 173 lb 1 oz (78.5 kg)  10/11/23 173 lb (78.5 kg)    GEN: Well nourished, well groomed in no acute distress; mildly obese NECK: No JVD; No carotid bruits CARDIAC: Normal S1, S2; RRR, no murmurs, rubs, gallops RESPIRATORY:  Clear to auscultation without rales, wheezing or rhonchi ; nonlabored, good air movement. ABDOMEN: Soft, non-tender, non-distended EXTREMITIES:  No edema; No deformity      ASSESSMENT AND PLAN: .    Problem List Items Addressed This Visit       Cardiology Problems   HTN (hypertension) (Chronic)   Blood pressure well-controlled on current medications: -Continue amlodipine 2.5 mg daily along with Lasix 20 mg daily use for minor edema.      Relevant Orders   EKG 12-Lead (Completed)   Hyperlipidemia associated with type 2 diabetes mellitus (HCC) (Chronic)   Most recent LDL was 74.  We talked about baseline cardiovascular risk evaluation which is warranted, but does not need to be done prior to her upcoming procedure. Will see her back in a few months after her procedure and we can talk about potentially checking Coronary Calcium Score to determine goals of care.      Moderate aortic stenosis by prior echocardiography (Chronic)   Moderate aortic stenosis noted on previous echocardiogram. No symptoms of angina, heart failure, or syncope. Possible history of rheumatic heart disease.  Mean  gradient only 20 mg mercury therefore not likely to be at a level of concern from a symptom standpoint. -Plan to follow-up echocardiogram to ensure stability of aortic stenosis--this assessment in no way impacts cardiovascular evaluation for upcoming procedure. -If stable, follow-up echocardiogram in 2-3 years.   I spent several minutes explaining the pathophysiology of aortic stenosis and then monitoring as well as symptoms.        Other   Abnormal EKG   Very interesting that her EKG in the preop visit showed some asymmetrical T wave inversions in inferior leads as well as subtle scooping ST segments in the anterolateral leads.  Today her EKG does not show any of these findings.  In the absence of any concerning symptoms of chest pain pressure or dyspnea to suggest CAD, would probably not evaluate further at this point.  We can do a more detailed evaluation based on based on risk assessment with Coronary Calcium Score in the future, but for now would not recommend any additional testing beyond the existing echocardiogram.      Relevant Orders   EKG 12-Lead (Completed)   Controlled type 2 diabetes mellitus without complication, without long-term current use of insulin (HCC) (Chronic)   A1c 5.5.  Had previously been 7.4 over a year ago.  Is now on Amaryl and Ozempic.  Not on insulin.      Preop cardiovascular exam - Primary   Ms. Birkland's perioperative risk of a major cardiac event is 0.4% according to the Revised Cardiac Risk Index (RCRI).  Therefore, she is at low risk for perioperative complications.   Her functional capacity is fair, but mostly limited by MSK issues at 4.64 METs according to the Duke Activity Status Index (DASI). Recommendations: According to ACC/AHA guidelines, no further cardiovascular testing needed.  The patient may proceed to surgery at acceptable risk.  She had an echo done in March 2024 which did show moderate aortic stenosis with a gradient of 20 mmHg.  She is  asymptomatic.  Guideline recommendation will be to reassess in 3 years. Antiplatelet and/or Anticoagulation Recommendations: If necessary, okay to hold aspirin  Not on anticoagulation       Ankle Swelling Chronic ankle swelling, possibly due to venous stasis. -Continue current diuretic therapy.  Anemia Baseline anemia managed with infusions. -Continue current management plan.   Pre-OP  Follow-up in 3 months to reassess aortic stenosis and overall cardiovascular health.      Total time spent: 30 min spent with patient + 31 min spent charting = 61 min I spent 61 minutes in the care of [facility] today including reviewing outside labs from Care Everywhere (3 minutes), reviewing outside studies (7 minutes), face to face time discussing treatment options (31 minutes), reviewing records from [facility] Everywhere (7 minutes), 14 minutes dictating, and documenting in the encounter.      Signed, Marykay Lex, MD, MS Margaret [name], M.D., M.S. Interventional Cardiologist  [facility]  Pager # [phone] Phone # [phone] [address] [place], [place] [address]

## 2023-11-18 NOTE — Assessment & Plan Note (Addendum)
Moderate aortic stenosis noted on previous echocardiogram. No symptoms of angina, heart failure, or syncope. Possible history of rheumatic heart disease.  Mean gradient only 20 mg mercury therefore not likely to be at a level of concern from a symptom standpoint. -Plan to follow-up echocardiogram to ensure stability of aortic stenosis--this assessment in no way impacts cardiovascular evaluation for upcoming procedure. -If stable, follow-up echocardiogram in 2-3 years.   I spent several minutes explaining the pathophysiology of aortic stenosis and then monitoring as well as symptoms.

## 2023-11-18 NOTE — Assessment & Plan Note (Signed)
Very interesting that her EKG in the preop visit showed some asymmetrical T wave inversions in inferior leads as well as subtle scooping ST segments in the anterolateral leads.  Today her EKG does not show any of these findings.  In the absence of any concerning symptoms of chest pain pressure or dyspnea to suggest CAD, would probably not evaluate further at this point.  We can do a more detailed evaluation based on based on risk assessment with Coronary Calcium Score in the future, but for now would not recommend any additional testing beyond the existing echocardiogram.

## 2023-11-18 NOTE — Assessment & Plan Note (Signed)
Blood pressure well-controlled on current medications: -Continue amlodipine 2.5 mg daily along with Lasix 20 mg daily use for minor edema.

## 2023-11-19 ENCOUNTER — Other Ambulatory Visit (HOSPITAL_COMMUNITY): Payer: Self-pay | Admitting: Student

## 2023-11-19 DIAGNOSIS — N2889 Other specified disorders of kidney and ureter: Secondary | ICD-10-CM

## 2023-11-19 NOTE — Progress Notes (Incomplete)
Perioperative / Anesthesia Services  Pre-Admission Testing Clinical Review / Pre-Operative Anesthesia Consult  Date: 11/19/23  Patient Demographics:  Name: Margaret [name] DOB: [birth year] MRN:   [id number]  Planned Surgical Procedure(s):    Date/Time: 11/22/23 0830   Procedure: CT RENAL TUMOR ABLATION UNILATERAL   Diagnosis: Right renal mass [N28.89]   Indications: Ct guided MWA right renal mass   Location: [facility] CT [facility]      NOTE: Available PAT nursing documentation and vital signs have been reviewed. Clinical nursing staff has updated patient's PMH/PSHx, current medication list, and drug allergies/intolerances to ensure comprehensive history available to assist in medical decision making as it pertains to the aforementioned surgical procedure and anticipated anesthetic course. Extensive review of available clinical information personally performed. [place] PMH and PSHx updated with any diagnoses/procedures that  may have been inadvertently omitted during his intake with the pre-admission testing department's nursing staff.  Clinical Discussion:  Margaret [name] is a 68 y.o. female who is submitted for pre-surgical anesthesia review and clearance prior to her undergoing the above procedure. Patient is a Former Games developer . Pertinent PMH includes: CAD, aortic stenosis, HFpEF, BILATERAL carotid artery disease, aortic atherosclerosis, cardiac murmur, HTN, HLD, T2DM, CKD-III, multiple pulmonary nodules, GERD (on daily PPI), ventral hernia, IDA, thrombocytopenia, cirrhosis secondary to NASH, portal venous hypertension, portal hypertensive gastropathy, splenomegaly, nephrolithiasis, thoracic spondylosis, lumbar DDD, frequent falls, RIGHT renal mass, BILATERAL renal cysts, tremors, anxiety, depression, bipolar disorder  Patient is followed by cardiology Herbie Baltimore, MD). She was last seen in the cardiology clinic on 11/17/2022; notes reviewed. At the time of her clinic visit,  patient doing well overall from a cardiovascular perspective. Patient with some chronic ankle edema being managed with the use of diuretic medications. Patient denied any chest pain, shortness of breath, PND, orthopnea, palpitations, weakness, fatigue, vertiginous symptoms, or presyncope/syncope. Patient with a past medical history significant for cardiovascular diagnoses. Documented physical exam was grossly benign, providing no evidence of acute exacerbation and/or decompensation of the patient's known cardiovascular conditions.  Patient with known BILATERAL carotid artery disease. Most recent assessment of her carotid arteries demonstrated mild stenosis, with the degree of stenosis being <50% in the patient's BILATERAL internal carotid arteries.   TTE performed on 12/15/2022 reviewed a normal left ventricular systolic function with an EF  of >55%. There was mild LVH. Left ventricular diastolic Doppler parameters consistent with abnormal relaxation (G1DD). Left atrium noted to be mildly enlarged. There was mild mitral annular calcification. Trivial to mild panvalvular regurgitation was noted. Moderate stenosis of the aortic valve was noted with a mean transvalvular pressure gradient of 20.0 mmHg; AVA(VTI) = 1.7 cm2. All other transvalvular gradients were normal with no evidence of stenosis. Aorta normal in size with no evidence of ectasia or aneurysmal dilatation.   Blood pressure well controlled at 128/60 mmHg on currently prescribed CCB (amlodipine) and diuretic (furosemide + spironolactone)  therapies.  Patient was not taking any type of lipid lowering therapies for her HLD diagnosis and ASCVD prevention. T2DM controlled on currently prescribed regimen; last HgbA1c was 5.5% when checked on 10/25/2023. She does not have an OSAH diagnosis. ECG repeating with ECG changes demonstrated in preop (inferior TWIs and anterolateral ST scooping had resolved).  capacity somewhat limited by arthritides and her  multiple medical comorbidities, With that said, patient is able to complete all of her ADL/IADLs without significant cardiovascular limitation. Per the DASI, patient felt to be able to achieve at least 4 METS of physical activity without experiencing any  significant degree of angina/anginal equivalent symptoms. No changes were made to her medication regimen during her visit with cardiology. Discussed repeat TTE in 2-3 years for surveillance of her demonstrated aortic valve stenosis. Additionally, potential coronary CT imaging to assess for coronary calcium depositions was discussed in the future sometime after upcoming procedure with interventional radiology. Patient scheduled to follow-up with outpatient cardiology in 3 months or sooner if needed.  Margaret [name] underwent MRI imaging of the abdomen on 03/30/2023 revealing a complex cystic renal cortical lesion in the posterior aspect of the mid to lower RIGHT kidney.  Identified lesion measured 1.2 x 1.2 cm (Bosniak 3). Repeat MRI imaging performed on 08/03/2023 again demonstrated the RIGHT renal lesion now measuring 1.4 x 1.3 cm.  Given interval increase in size and morphology of lesion, there was concern for renal cell carcinoma.  Ultrasound imaging of the abdomen performed on 10/12/2023 measured the complex cystic lesion at 1.7 cm.  Most recent MRI imaging of the abdomen performed on 11/15/2023 demonstrated further interval increase in size of lesion to 1.6 x 1.2 x 1.0 cm.  Patient was referred to interventional radiology for further evaluation and management.  Following consultation, the decision has been made to proceed with CT RENAL TUMOR ABLATION on 11/22/2023 with Dr. Lemmie Evens, MD.    Given patient's past medical history significant for cardiovascular diagnoses, presurgical clearance was sought by the PAT team. Per cardiology, "Ms. Lamar's perioperative risk of a major cardiac event is 0.4% according to the Revised Cardiac Risk Index (RCRI).  Therefore, she is at LOW risk for perioperative complications. Her functional capacity is fair, but mostly limited by MSK issues at 4.64 METs according to the [place] Activity Status Index (DASI). According to ACC/AHA guidelines, no further cardiovascular testing needed.  The patient may proceed to surgery at ACCEPTABLE risk.  She had an echo done in March 2024 which did show moderate aortic stenosis with a gradient of 20 mmHg. She is asymptomatic.  Guideline recommendation will be to reassess in 3 years".  In review of her medication reconciliation, the patient is not noted to be taking any type of anticoagulation or antiplatelet therapies that would need to be held during her perioperative course.  Patient denies previous perioperative complications with anesthesia in the past. In review her EMR, it is noted that patient underwent a general anesthetic course here at [facility] (ASA III) in 05/2023 without documented complications.      11/18/2023   10:18 AM 11/18/2023   10:08 AM 11/11/2023   11:00 AM  Vitals with BMI  Height  5\' 0"  5\' 0"   Weight  170 lbs 4 oz 173 lbs 1 oz  BMI  33.25 33.8  Systolic 128 136   Diastolic 60 60   Pulse 61     Providers/Specialists:  NOTE: Primary physician provider listed below. Patient may have been seen by APP or partner within same practice.   PROVIDER ROLE / SPECIALTY LAST Waldemar Dickens, MD Interventional Radiology (Proceduralist) 10/28/2023  Danella Penton, MD Primary Care Provider 10/28/2023  Azriel Dancy Lemma, MD Cardiology 11/18/2023  Edward Jolly, MD Pain management 08/24/2023  Louretta Shorten, MD Medical Oncology 10/11/2023  Irineo Axon, MD Urology 08/26/2023  Jomarie Longs, MD Psychiatry 07/23/2023   Allergies:   Allergies  Allergen Reactions   Lithium Nausea And Vomiting and Other (See Comments)   Penicillins Hives    Resulted in hospitalization   Sulfa Antibiotics Hives    Resulted in  hospitalization   Benzonatate Other (See Comments)   Current Home Medications:    amLODipine (NORVASC) 2.5 MG tablet   ARIPiprazole (ABILIFY) 10 MG tablet   benztropine (COGENTIN) 0.5 MG tablet   escitalopram (LEXAPRO) 10 MG tablet   furosemide (LASIX) 20 MG tablet   glimepiride (AMARYL) 2 MG tablet   HYDROcodone-acetaminophen (NORCO) 10-325 MG tablet   hydrOXYzine (VISTARIL) 25 MG capsule   lamoTRIgine (LAMICTAL) 200 MG tablet   meclizine (ANTIVERT) 25 MG tablet   metFORMIN (GLUCOPHAGE) 500 MG tablet   OZEMPIC, 1 MG/DOSE, 4 MG/3ML SOPN   pantoprazole (PROTONIX) 40 MG tablet   polyethylene glycol powder (GLYCOLAX/MIRALAX) 17 GM/SCOOP powder   spironolactone (ALDACTONE) 25 MG tablet   tizanidine (ZANAFLEX) 2 MG capsule   XIFAXAN 550 MG TABS tablet   No current facility-administered medications for this encounter.   History:   Past Medical History:  Diagnosis Date   (HFpEF) heart failure with preserved ejection fraction (HCC)    a.) TTE 12/15/2022: EF >55%, mild LVH, G1DD, mild LAE, mild MAC, triv AR/MR/TR, mild TR, RVSP 30.7, mod AS (MPG 20)   Anxiety    Aortic atherosclerosis (HCC)    Aortic stenosis    a.) TTE 12/15/2022: moderate AS (MPG 20 mmHg; AVA = 1.7 cm2)   Bilateral carotid artery disease (HCC)    a.) doppler 12/22/2020: <50% BICA   Bilateral renal cysts    Bipolar 1 disorder (HCC)    CAD (coronary artery disease) 11/04/2022   a.) CT chest 11/04/2022: 3v CAD   Cardiac murmur    CKD (chronic kidney disease), stage III (HCC)    DDD (degenerative disc disease), lumbar    Depression    DM (diabetes mellitus), type 2 (HCC)    Former smoker    Frequent falls    GERD (gastroesophageal reflux disease)    H/O: GI bleed    Heart murmur    History of blood transfusion    History of MRSA infection 2017   HLD (hyperlipidemia)    Hypertension    IDA (iron deficiency anemia)    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H [facility])    Multiple pulmonary nodules    Nephrolithiasis     Occasional tremors    Osteoarthritis    Osteoporosis    Portal hypertensive gastropathy (HCC)    Portal venous hypertension (HCC)    Renal mass, right 03/30/2023   a.) MRI ABD 03/30/2023: 1.2 x 1.2 cm complex cystic renal cortical mass in the posterior mid to low RIGHT kidney (Bosniak 3); b.) MRI ABD 08/03/2023: interval increase in size to 1.4 x 1.3 cm -> concerning for RCC; c.) [place] ABD 10/12/2023: 1.7 complex cystic mass RIGHT kidney; d.) MRI ABD 11/15/2023: interval increase in size to 1.6 x 1.2 x 1.0 cm   Splenomegaly    Thoracic spondylosis    Thrombocytopenia (HCC)    Tubular adenoma of colon    Uses walker    Venous insufficiency    Ventral hernia    Past Surgical History:  Procedure Laterality Date   ABDOMINAL HYSTERECTOMY     CHOLECYSTECTOMY     COLONOSCOPY WITH PROPOFOL N/A 06/23/2019   Procedure: COLONOSCOPY WITH PROPOFOL;  Surgeon: Wyline Mood, MD;  Location[facility] END[facility]COPY;  Service: Gastroenterology;  Laterality: N/A;   COLONOSCOPY WITH PROPOFOL N/A 12/08/2022   Procedure: COLONOSCOPY WITH PROPOFOL;  Surgeon: Regis Bill, MD;  Location: [facility] ENDOSCOPY;  Service: Endoscopy;  Laterality: N/A;   COLONOSCOPY WITH PROPOFOL N/A 05/18/2023   Procedure: COLONOSCOPY  WITH PROPOFOL;  Surgeon: Regis Bill, MD;  Location: [facility] END[facility]COPY;  Service: Endoscopy;  Laterality: N/A;   ESOPHAGOGASTRODUODENOSCOPY (EGD) WITH PROPOFOL N/A 06/23/2019   Procedure: ESOPHAGOGASTRODUODENOSCOPY (EGD) WITH PROPOFOL;  Surgeon: Wyline Mood, MD;  Location: [facility] ENDOSCOPY;  Service: Gastroenterology;  Laterality: N/A;   ESOPHAGOGASTRODUODENOSCOPY (EGD) WITH PROPOFOL N/A 12/08/2022   Procedure: ESOPHAGOGASTRODUODENOSCOPY (EGD) WITH PROPOFOL;  Surgeon: Regis Bill, MD;  Location: [facility] ENDOSCOPY;  Service: Endoscopy;  Laterality: N/A;  DM, OZEMPIC   ESOPHAGOGASTRODUODENOSCOPY (EGD) WITH PROPOFOL N/A 01/12/2023   Procedure: ESOPHAGOGASTRODUODENOSCOPY (EGD) WITH PROPOFOL;  Surgeon:  Regis Bill, MD;  Location: [facility] ENDOSCOPY;  Service: Endoscopy;  Laterality: N/A;  DM   IR RADIOLOGIST EVAL & MGMT  10/28/2023   POLYPECTOMY  05/18/2023   Procedure: POLYPECTOMY;  Surgeon: Regis Bill, MD;  Location: [facility] ENDOSCOPY;  Service: Endoscopy;;   TONSILLECTOMY     TOTAL HIP ARTHROPLASTY Right 2017   Family History  Problem Relation Age of Onset   Lung cancer Mother    Heart attack Father 64   Mental illness Father    Bipolar disorder Brother    Bipolar disorder Paternal Uncle    Bipolar disorder Paternal Uncle    Social History   Tobacco Use   Smoking status: Former    Current packs/day: 0.00    Types: Cigarettes    Quit date: 03/03/2011    Years since quitting: 12.7   Smokeless tobacco: Never  Substance Use Topics   Alcohol use: Yes    Comment: rarely   Pertinent Clinical Results:  LABS:  No visits with results within 30 Day(s) from this visit.  Latest known visit with results is:  Appointment on 10/11/2023  Component Date Value Ref Range Status   Blood Bank Specimen 10/11/2023 SAMPLE AVAILABLE FOR TESTING   Final   Sample Expiration 10/11/2023    Final                   Value:10/14/2023,2359 Performed at [facility], [address] [place], [place] [address]    Iron 10/11/2023 36  28 - 170 ug/dL Final   TIBC 60/45/4098 539 (H)  250 - 450 ug/dL Final   Saturation Ratios 10/11/2023 7 (L)  10.4 - 31.8 % Final   UIBC 10/11/2023 503  ug/dL Final   Performed at [facility], [address], [place], [place] [address]   Ferritin 10/11/2023 21  11 - 307 ng/mL Final   Performed at [facility], [address] [place], [place] [address]   Sodium 10/11/2023 137  135 - 145 mmol/L Final   Potassium 10/11/2023 4.7  3.5 - 5.1 mmol/L Final   Chloride 10/11/2023 103  98 - 111 mmol/L Final   CO2 10/11/2023 24  22 - 32 mmol/L Final   Glucose, Bld 10/11/2023 161 (H)  70 - 99 mg/dL Final   Glucose reference range applies only  to samples taken after fasting for at least 8 hours.   BUN 10/11/2023 21  8 - 23 mg/dL Final   Creatinine, Ser 10/11/2023 1.06 (H)  0.44 - 1.00 mg/dL Final   Calcium 62/13/0865 9.3  8.9 - 10.3 mg/dL Final   Total Protein 78/46/9629 6.7  6.5 - 8.1 g/dL Final   Albumin 52/84/1324 3.8  3.5 - 5.0 g/dL Final   AST 40/08/2724 25  15 - 41 U/L Final   ALT 10/11/2023 20  0 - 44 U/L Final   Alkaline Phosphatase 10/11/2023 70  38 -  126 U/L Final   Total Bilirubin 10/11/2023 1.0  <1.2 mg/dL Final   GFR, Estimated 10/11/2023 58 (L)  >60 mL/min Final   Comment: (NOTE) Calculated using the CKD-EPI Creatinine Equation (2021)    Anion gap 10/11/2023 10  5 - 15 Final   Performed at [facility], [address] [place], [place] [address]   WBC 10/11/2023 4.1  4.0 - 10.5 K/uL Final   RBC 10/11/2023 3.49 (L)  3.87 - 5.11 MIL/uL Final   Hemoglobin 10/11/2023 9.2 (L)  12.0 - 15.0 g/dL Final   HCT 50/93/2671 29.2 (L)  36.0 - 46.0 % Final   MCV 10/11/2023 83.7  80.0 - 100.0 fL Final   MCH 10/11/2023 26.4  26.0 - 34.0 pg Final   MCHC 10/11/2023 31.5  30.0 - 36.0 g/dL Final   RDW 24/58/0998 14.7  11.5 - 15.5 % Final   Platelets 10/11/2023 104 (L)  150 - 400 K/uL Final   nRBC 10/11/2023 0.0  0.0 - 0.2 % Final   Neutrophils Relative % 10/11/2023 63  % Final   Neutro Abs 10/11/2023 2.6  1.7 - 7.7 K/uL Final   Lymphocytes Relative 10/11/2023 23  % Final   Lymphs Abs 10/11/2023 0.9  0.7 - 4.0 K/uL Final   Monocytes Relative 10/11/2023 10  % Final   Monocytes Absolute 10/11/2023 0.4  0.1 - 1.0 K/uL Final   Eosinophils Relative 10/11/2023 2  % Final   Eosinophils Absolute 10/11/2023 0.1  0.0 - 0.5 K/uL Final   Basophils Relative 10/11/2023 1  % Final   Basophils Absolute 10/11/2023 0.0  0.0 - 0.1 K/uL Final   Immature Granulocytes 10/11/2023 1  % Final   Abs Immature Granulocytes 10/11/2023 0.02  0.00 - 0.07 K/uL Final   Performed at [facility], [address] [place], [place] [address]     ECG: Date: 11/18/2023  Time ECG obtained: 1020 AM Rate: 61 bpm Rhythm: normal sinus Axis (leads I and aVF): normal Intervals: PR 160 ms. QRS 70 ms. QTc 430 ms. ST segment and T wave changes: Non-specific ST abnormalities  Comparison: Inferolateral TWI present on 11/15/2023 have resolved.    IMAGING / PROCEDURES: MR ABDOMEN WWO CONTRAST performed on 11/15/2023 Evidence of chronic liver disease with a micronodular liver with the areas of fibrosis, trace ascites and prominent left-sided varices.  Mild splenomegaly.  No aggressive hypervascular liver mass at this time. Prominent upper abdominal retroperitoneal nodes are similar to previous. Attention on follow-up. Bilateral Bosniak 1 and 2 renal cystic lesions. The more complex lesion along the midportion of the right kidney posteriorly is similar to previous examination. Recommend continued surveillance in 6 months.  US ABDOMEN COMPLETE performed on 10/12/2023 Cirrhotic morphology of the liver. No focal lesion. Splenomegaly There are 2 adjacent 1 cm hypoechoic masses within the porta hepatis, potentially porta hepatic lymph nodes. Recommend attention on follow-up. There is a 1.7 cm complex cystic lesion within the right kidney. Recommend follow-up renal protocol CT or MRI in 6 months. Nonobstructing right renal stone.   CT CHEST W CONTRAST performed on 02/03/2023 Stable small bilateral pulmonary nodules, including a linear 6 mm left upper lobe nodule. This exam constitutes three months of imaging stability. If future CT in 15-21 months (from today's scan, 18-24 months from initial January 2024 scan) is considered optional for low risk patients but is recommended for high-risk patients. This recommendation follows the Fleischner society guidelines. Hepatic cirrhosis with portal hypertension, splenomegaly, and upper abdominal varices. There also  mediastinal varices as before. Upper abdominal lymphadenopathy, increased in size from prior  exam. Recommend follow-up abdominal MRI for complete assessment and to assess for any occult liver lesions in the setting of cirrhosis. Aortic atherosclerosis  TRANSTHORACIC ECHOCARDIOGRAM performed on 12/15/2022 Normal left ventricular systolic function with an EF of >55% Mild LVH Left ventricular diastolic Doppler parameters consistent with abnormal relaxation (G1DD). Mild left atrial enlargement Normal right ventricular systolic function Mild mitral annular calcification Trivial aortic, mitral, and pulmonary valve regurgitation Mild tricuspid valve regurgitation Moderate aortic valve stenosis; mean transvalvular pressure gradient 20.0 mmHg, AVA (VTI) = 1.7 cm. No pericardial effusion  US CAROTID BILATERAL performed on 12/25/2020 Bilateral carotid atherosclerosis. No hemodynamically significant ICA stenosis. Degree of narrowing less than 50% bilaterally by ultrasound criteria. Patent antegrade vertebral flow bilaterally.  Impression and Plan:  Margaret [name] has been referred for pre-anesthesia review and clearance prior to her undergoing the planned anesthetic and procedural courses. Available labs, pertinent testing, and imaging results were personally reviewed by me in preparation for upcoming operative/procedural course. [facility] medical record has been updated following extensive record review and patient interview with PAT staff.   This patient has been appropriately cleared by cardiology with an overall LOW/ACCEPTABLE risk of experiencing significant perioperative cardiovascular complications. Based on clinical review performed today (11/20/23), barring any significant acute changes in the patient's overall condition, it is anticipated that she will be able to proceed with the planned surgical intervention. Any acute changes in clinical condition may necessitate her procedure being postponed and/or cancelled. Patient will meet with anesthesia team (MD and/or CRNA) on the day of her  procedure for preoperative evaluation/assessment. Questions regarding anesthetic course will be fielded at that time.   Pre-surgical instructions were reviewed with the patient during his PAT appointment, and questions were fielded to satisfaction by PAT clinical staff. She has been instructed on which medications that she will need to hold prior to surgery, as well as the ones that have been deemed safe/appropriate to take on the day of his procedure. As part of the general education provided by PAT, patient made aware both verbally and in writing, that she would need to abstain from the use of any illegal substances during his perioperative course. She was advised that failure to follow the provided instructions could necessitate case cancellation or result in serious perioperative complications up to and including death. Patient encouraged to contact PAT and/or her surgeon's office to discuss any questions or concerns that may arise prior to surgery; verbalized understanding.   Quentin Mulling, MSN, APRN, FNP-C, CEN [facility]  Perioperative Services Nurse Practitioner Phone: [phone] Fax: [phone] 11/19/23 10:50 AM  NOTE: This note has been prepared using Dragon dictation software. Despite my best ability to proofread, there is always the potential that unintentional transcriptional errors may still occur from this process.

## 2023-11-19 NOTE — Progress Notes (Signed)
Patient for CT guided MWA of RT renal lesion on Monday 11/22/23, I called and spoke with the patient on the phone and gave pre-procedure instructions. Pt was made aware to be here at 7:30a, NPO after MN prior to procedure as well as driver post procedure/recovery/discharge. Pt stated understanding.  Called several times prior to calling on 11/19/23  Pt had a pre-admit visit with Quentin Mulling, NP as well as a Cardiology visit with Dr Bryan Lemma, MD.  She has been cleared for this procedure.  She was also told by Quentin Mulling, NP to stop her GLP-1 and when.

## 2023-11-22 ENCOUNTER — Ambulatory Visit
Admission: RE | Admit: 2023-11-22 | Discharge: 2023-11-22 | Disposition: A | Discharge status: Home / Self Care | Payer: 59 | Source: Ambulatory Visit | Attending: Interventional Radiology | Admitting: Interventional Radiology

## 2023-11-22 ENCOUNTER — Encounter: Payer: Self-pay | Admitting: Urgent Care

## 2023-11-22 ENCOUNTER — Other Ambulatory Visit: Payer: Self-pay

## 2023-11-22 DIAGNOSIS — N183 Chronic kidney disease, stage 3 unspecified: Secondary | ICD-10-CM | POA: Diagnosis not present

## 2023-11-22 DIAGNOSIS — Z8249 Family history of ischemic heart disease and other diseases of the circulatory system: Secondary | ICD-10-CM | POA: Insufficient documentation

## 2023-11-22 DIAGNOSIS — I5032 Chronic diastolic (congestive) heart failure: Secondary | ICD-10-CM | POA: Insufficient documentation

## 2023-11-22 DIAGNOSIS — E1122 Type 2 diabetes mellitus with diabetic chronic kidney disease: Secondary | ICD-10-CM | POA: Diagnosis not present

## 2023-11-22 DIAGNOSIS — Z01818 Encounter for other preprocedural examination: Secondary | ICD-10-CM

## 2023-11-22 DIAGNOSIS — Z87891 Personal history of nicotine dependence: Secondary | ICD-10-CM | POA: Diagnosis not present

## 2023-11-22 DIAGNOSIS — I130 Hypertensive heart and chronic kidney disease with heart failure and stage 1 through stage 4 chronic kidney disease, or unspecified chronic kidney disease: Secondary | ICD-10-CM | POA: Insufficient documentation

## 2023-11-22 DIAGNOSIS — N2889 Other specified disorders of kidney and ureter: Secondary | ICD-10-CM | POA: Insufficient documentation

## 2023-11-22 HISTORY — DX: Disorder of arteries and arterioles, unspecified: I77.9

## 2023-11-22 HISTORY — DX: Cardiac murmur, unspecified: R01.1

## 2023-11-22 HISTORY — DX: Anxiety disorder, unspecified: F41.9

## 2023-11-22 HISTORY — DX: Other diseases of stomach and duodenum: K31.89

## 2023-11-22 HISTORY — DX: Splenomegaly, not elsewhere classified: R16.1

## 2023-11-22 HISTORY — DX: Portal hypertension: K76.6

## 2023-11-22 HISTORY — DX: Atherosclerosis of aorta: I70.0

## 2023-11-22 HISTORY — DX: Spondylosis without myelopathy or radiculopathy, thoracic region: M47.814

## 2023-11-22 HISTORY — DX: Calculus of kidney: N20.0

## 2023-11-22 HISTORY — DX: Other nonspecific abnormal finding of lung field: R91.8

## 2023-11-22 HISTORY — DX: Cyst of kidney, acquired: N28.1

## 2023-11-22 HISTORY — DX: Ventral hernia without obstruction or gangrene: K43.9

## 2023-11-22 HISTORY — DX: Chronic kidney disease, stage 3 unspecified: N18.30

## 2023-11-22 HISTORY — DX: Other intervertebral disc degeneration, lumbar region without mention of lumbar back pain or lower extremity pain: M51.369

## 2023-11-22 HISTORY — DX: Benign neoplasm of colon, unspecified: D12.6

## 2023-11-22 HISTORY — DX: Hyperlipidemia, unspecified: E78.5

## 2023-11-22 HISTORY — DX: Unspecified osteoarthritis, unspecified site: M19.90

## 2023-11-22 LAB — BASIC METABOLIC PANEL
Anion gap: 8 (ref 5–15)
BUN: 14 mg/dL (ref 8–23)
CO2: 26 mmol/L (ref 22–32)
Calcium: 8.9 mg/dL (ref 8.9–10.3)
Chloride: 103 mmol/L (ref 98–111)
Creatinine, Ser: 0.86 mg/dL (ref 0.44–1.00)
GFR, Estimated: >60 mL/min (ref >60)
Glucose, Bld: 274 mg/dL — ABNORMAL HIGH (ref 70–99)
Potassium: 4.3 mmol/L (ref 3.5–5.1)
Sodium: 137 mmol/L (ref 135–145)

## 2023-11-22 LAB — CBC
HCT: 32.1 % — ABNORMAL LOW (ref 36.0–46.0)
Hemoglobin: 10.2 g/dL — ABNORMAL LOW (ref 12.0–15.0)
MCH: 27.2 pg (ref 26.0–34.0)
MCHC: 31.8 g/dL (ref 30.0–36.0)
MCV: 85.6 fL (ref 80.0–100.0)
Platelets: 90 10*3/uL — ABNORMAL LOW (ref 150–400)
RBC: 3.75 MIL/uL — ABNORMAL LOW (ref 3.87–5.11)
RDW: 16.5 % — ABNORMAL HIGH (ref 11.5–15.5)
WBC: 4.4 10*3/uL (ref 4.0–10.5)
nRBC: 0 % (ref 0.0–0.2)

## 2023-11-22 LAB — GLUCOSE, CAPILLARY
Glucose-Capillary: 260 mg/dL — ABNORMAL HIGH (ref 70–99)
Glucose-Capillary: 220 mg/dL — ABNORMAL HIGH (ref 70–99)

## 2023-11-22 LAB — PROTIME-INR
INR: 1.2 (ref 0.8–1.2)
Prothrombin Time: 15.9 s — ABNORMAL HIGH (ref 11.4–15.2)

## 2023-11-22 MED ORDER — LIDOCAINE HCL (CARDIAC) PF 100 MG/5ML IV SOSY
PREFILLED_SYRINGE | INTRAVENOUS | Status: DC | PRN
  Administered 2023-11-22: 80 mg via INTRAVENOUS

## 2023-11-22 MED ORDER — PROPOFOL 10 MG/ML IV BOLUS
INTRAVENOUS | Status: DC | PRN
  Administered 2023-11-22: 100 mg via INTRAVENOUS
  Administered 2023-11-22: 50 mg via INTRAVENOUS
  Administered 2023-11-22: 25 ug/kg/min via INTRAVENOUS

## 2023-11-22 MED ORDER — INSULIN ASPART 100 UNIT/ML IJ SOLN
5.0000 [IU] | Freq: Once | INTRAMUSCULAR | Status: AC
  Administered 2023-11-22: 5 [IU] via SUBCUTANEOUS
  Filled 2023-11-22: qty 0.05

## 2023-11-22 MED ORDER — LACTATED RINGERS IV SOLN: INTRAVENOUS | Status: DC | PRN

## 2023-11-22 MED ORDER — MIDAZOLAM HCL 2 MG/2ML IJ SOLN
INTRAMUSCULAR | Status: DC | PRN
  Administered 2023-11-22: 1 mg via INTRAVENOUS

## 2023-11-22 MED ORDER — PROPOFOL 1000 MG/100ML IV EMUL
INTRAVENOUS | Status: AC
  Filled 2023-11-22: qty 100

## 2023-11-22 MED ORDER — ONDANSETRON HCL 4 MG/2ML IJ SOLN
INTRAMUSCULAR | Status: DC | PRN
  Administered 2023-11-22: 4 mg via INTRAVENOUS

## 2023-11-22 MED ORDER — DOCUSATE SODIUM 100 MG PO CAPS: 100.0000 mg | ORAL_CAPSULE | Freq: Two times a day (BID) | ORAL | Status: DC

## 2023-11-22 MED ORDER — PHENYLEPHRINE HCL-NACL 20-0.9 MG/250ML-% IV SOLN: INTRAVENOUS | Status: DC | PRN

## 2023-11-22 MED ORDER — ONDANSETRON HCL 4 MG/2ML IJ SOLN
4.0000 mg | Freq: Once | INTRAMUSCULAR | Status: AC | PRN
  Administered 2023-11-22: 4 mg via INTRAVENOUS
  Filled 2023-11-22: qty 2

## 2023-11-22 MED ORDER — CHLORHEXIDINE GLUCONATE 0.12 % MT SOLN
15.0000 mL | Freq: Once | OROMUCOSAL | Status: DC
  Filled 2023-11-22: qty 15

## 2023-11-22 MED ORDER — HYDROCODONE-ACETAMINOPHEN 5-325 MG PO TABS
1.0000 | ORAL_TABLET | ORAL | Status: DC | PRN
  Administered 2023-11-22: 2 via ORAL
  Filled 2023-11-22 (×2): qty 2

## 2023-11-22 MED ORDER — SODIUM CHLORIDE 0.9 % IV SOLN: INTRAVENOUS | Status: DC

## 2023-11-22 MED ORDER — INSULIN ASPART 100 UNIT/ML IJ SOLN
INTRAMUSCULAR | Status: AC
  Filled 2023-11-22: qty 1

## 2023-11-22 MED ORDER — SUGAMMADEX SODIUM 200 MG/2ML IV SOLN
INTRAVENOUS | Status: DC | PRN
  Administered 2023-11-22: 200 mg via INTRAVENOUS

## 2023-11-22 MED ORDER — ONDANSETRON HCL 4 MG/2ML IJ SOLN
INTRAMUSCULAR | Status: AC
  Filled 2023-11-22: qty 2

## 2023-11-22 MED ORDER — DEXAMETHASONE SODIUM PHOSPHATE 10 MG/ML IJ SOLN
INTRAMUSCULAR | Status: DC | PRN
  Administered 2023-11-22: 4 mg via INTRAVENOUS

## 2023-11-22 MED ORDER — PROPOFOL 10 MG/ML IV BOLUS
INTRAVENOUS | Status: AC
  Filled 2023-11-22: qty 40

## 2023-11-22 MED ORDER — EPHEDRINE SULFATE-NACL 50-0.9 MG/10ML-% IV SOSY
PREFILLED_SYRINGE | INTRAVENOUS | Status: DC | PRN
  Administered 2023-11-22: 5 mg via INTRAVENOUS

## 2023-11-22 MED ORDER — PHENYLEPHRINE HCL-NACL 20-0.9 MG/250ML-% IV SOLN
INTRAVENOUS | Status: AC
  Filled 2023-11-22: qty 250

## 2023-11-22 MED ORDER — ORAL CARE MOUTH RINSE: 15.0000 mL | Freq: Once | OROMUCOSAL | Status: DC

## 2023-11-22 MED ORDER — HYDROCODONE-ACETAMINOPHEN 5-325 MG PO TABS
ORAL_TABLET | ORAL | Status: AC
  Filled 2023-11-22: qty 2

## 2023-11-22 MED ORDER — FENTANYL CITRATE PF 50 MCG/ML IJ SOSY
25.0000 ug | PREFILLED_SYRINGE | INTRAMUSCULAR | Status: DC | PRN
  Filled 2023-11-22: qty 1

## 2023-11-22 MED ORDER — PHENYLEPHRINE 80 MCG/ML (10ML) SYRINGE FOR IV PUSH (FOR BLOOD PRESSURE SUPPORT)
PREFILLED_SYRINGE | INTRAVENOUS | Status: DC | PRN
  Administered 2023-11-22: 80 ug via INTRAVENOUS

## 2023-11-22 MED ORDER — CHLORHEXIDINE GLUCONATE CLOTH 2 % EX PADS: 6.0000 | MEDICATED_PAD | Freq: Every day | CUTANEOUS | Status: DC

## 2023-11-22 MED ORDER — KETAMINE HCL 50 MG/5ML IJ SOSY
PREFILLED_SYRINGE | INTRAMUSCULAR | Status: AC
  Filled 2023-11-22: qty 5

## 2023-11-22 MED ORDER — FENTANYL CITRATE (PF) 100 MCG/2ML IJ SOLN
INTRAMUSCULAR | Status: AC
  Filled 2023-11-22: qty 2

## 2023-11-22 MED ORDER — ROCURONIUM BROMIDE 100 MG/10ML IV SOLN
INTRAVENOUS | Status: DC | PRN
  Administered 2023-11-22: 60 mg via INTRAVENOUS

## 2023-11-22 MED ORDER — KETAMINE HCL 50 MG/5ML IJ SOSY
PREFILLED_SYRINGE | INTRAMUSCULAR | Status: DC | PRN
  Administered 2023-11-22: 20 mg via INTRAVENOUS

## 2023-11-22 MED ORDER — FENTANYL CITRATE (PF) 100 MCG/2ML IJ SOLN
INTRAMUSCULAR | Status: DC | PRN
  Administered 2023-11-22 (×2): 50 ug via INTRAVENOUS

## 2023-11-22 MED ORDER — ONDANSETRON HCL 4 MG/2ML IJ SOLN: 4.0000 mg | Freq: Four times a day (QID) | INTRAMUSCULAR | Status: DC | PRN

## 2023-11-22 MED ORDER — MIDAZOLAM HCL 2 MG/2ML IJ SOLN
INTRAMUSCULAR | Status: AC
Start: 2023-11-22 — End: ?
  Filled 2023-11-22: qty 2

## 2023-11-22 NOTE — H&P (Signed)
Chief Complaint: Patient was seen in consultation today for enlarging renal neoplasm at the request of McCullough,Heath K  Referring Physician(s): Sterling Big  Supervising Physician: Malachy Moan  Patient Status: [facility] - Out-pt  Full code  History of Present Illness: Margaret [name] is a 68 y.o. female with history of aortic stenosis, heart failure, bipolar 1 disorder, diabetes mellitus, GERD, HLD, anemia, HTN, NASH, osteoporosis, osteoarthritis, and ventral hernia. Patient was referred to IR by Riki Altes, MD. Patient is followed by Dr. Lonna Cobb for a right renal mass. This was initially found incidentally on MRI  03/30/2023. This study reported "small complex cystic 1.2 cm posterior to mid to lower right renal cortical mass with mildly thickened enhancing internal septation." Patient had a repeat MRI 08/03/23, which showed the cyst increased in size. IR intervention was recommended at her visit 08/26/23. Patient had a consultation with Dr. Archer Asa 10/28/23.  Patient reports that she does not have any symptoms. The lesion was found incidentally on a MRI that was done for her liver. Patient reports that surveillance was recommended for the lesion. However, it has grown in size over the last 6 months. Patient decided to proceed with a non invasive option before it grew further in size. Patient's son is present today. Patient denies: chest pain, shortness of breath, vomiting, fever, abdominal pain, vomiting, and/or diarrhea.   Past Medical History:  Diagnosis Date   (HFpEF) heart failure with preserved ejection fraction (HCC)    a.) TTE 12/15/2022: EF >55%, mild LVH, G1DD, mild LAE, mild MAC, triv AR/MR/TR, mild TR, RVSP 30.7, mod AS (MPG 20)   Anxiety    Aortic atherosclerosis (HCC)    Aortic stenosis    a.) TTE 12/15/2022: moderate AS (MPG 20 mmHg; AVA = 1.7 cm2)   Bilateral carotid artery disease (HCC)    a.) doppler 12/22/2020: <50% BICA   Bilateral renal cysts     Bipolar 1 disorder (HCC)    CAD (coronary artery disease) 11/04/2022   a.) CT chest 11/04/2022: 3v CAD   Cardiac murmur    CKD (chronic kidney disease), stage III (HCC)    DDD (degenerative disc disease), lumbar    Depression    DM (diabetes mellitus), type 2 (HCC)    Former smoker    Frequent falls    GERD (gastroesophageal reflux disease)    H/O: GI bleed    Heart murmur    History of blood transfusion    History of MRSA infection 2017   HLD (hyperlipidemia)    Hypertension    IDA (iron deficiency anemia)    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H [facility])    Multiple pulmonary nodules    Nephrolithiasis    Occasional tremors    Osteoarthritis    Osteoporosis    Portal hypertensive gastropathy (HCC)    Portal venous hypertension (HCC)    Renal mass, right 03/30/2023   a.) MRI ABD 03/30/2023: 1.2 x 1.2 cm complex cystic renal cortical mass in the posterior mid to low RIGHT kidney (Bosniak 3); b.) MRI ABD 08/03/2023: interval increase in size to 1.4 x 1.3 cm -> concerning for RCC; c.) [place] ABD 10/12/2023: 1.7 complex cystic mass RIGHT kidney; d.) MRI ABD 11/15/2023: interval increase in size to 1.6 x 1.2 x 1.0 cm   Splenomegaly    Thoracic spondylosis    Thrombocytopenia (HCC)    Tubular adenoma of colon    Uses walker    Venous insufficiency    Ventral hernia  Past Surgical History:  Procedure Laterality Date   ABDOMINAL HYSTERECTOMY     CHOLECYSTECTOMY     COLONOSCOPY WITH PROPOFOL N/A 06/23/2019   Procedure: COLONOSCOPY WITH PROPOFOL;  Surgeon: Wyline Mood, MD;  Location: [facility] ENDOSCOPY;  Service: Gastroenterology;  Laterality: N/A;   COLONOSCOPY WITH PROPOFOL N/A 12/08/2022   Procedure: COLONOSCOPY WITH PROPOFOL;  Surgeon: Regis Bill, MD;  Location: [facility] ENDOSCOPY;  Service: Endoscopy;  Laterality: N/A;   COLONOSCOPY WITH PROPOFOL N/A 05/18/2023   Procedure: COLONOSCOPY WITH PROPOFOL;  Surgeon: Regis Bill, MD;  Location: [facility] ENDOSCOPY;  Service: Endoscopy;   Laterality: N/A;   ESOPHAGOGASTRODUODENOSCOPY (EGD) WITH PROPOFOL N/A 06/23/2019   Procedure: ESOPHAGOGASTRODUODENOSCOPY (EGD) WITH PROPOFOL;  Surgeon: Wyline Mood, MD;  Location: [facility] ENDOSCOPY;  Service: Gastroenterology;  Laterality: N/A;   ESOPHAGOGASTRODUODENOSCOPY (EGD) WITH PROPOFOL N/A 12/08/2022   Procedure: ESOPHAGOGASTRODUODENOSCOPY (EGD) WITH PROPOFOL;  Surgeon: Regis Bill, MD;  Location: [facility] ENDOSCOPY;  Service: Endoscopy;  Laterality: N/A;  DM, OZEMPIC   ESOPHAGOGASTRODUODENOSCOPY (EGD) WITH PROPOFOL N/A 01/12/2023   Procedure: ESOPHAGOGASTRODUODENOSCOPY (EGD) WITH PROPOFOL;  Surgeon: Regis Bill, MD;  Location: [facility] ENDOSCOPY;  Service: Endoscopy;  Laterality: N/A;  DM   IR RADIOLOGIST EVAL & MGMT  10/28/2023   POLYPECTOMY  05/18/2023   Procedure: POLYPECTOMY;  Surgeon: Regis Bill, MD;  Location: [facility] ENDOSCOPY;  Service: Endoscopy;;   TONSILLECTOMY     TOTAL HIP ARTHROPLASTY Right 2017    Allergies: Lithium, Penicillins, Sulfa antibiotics, and Benzonatate  Medications: Prior to Admission medications   Medication Sig Start Date End Date Taking? Authorizing Provider  amLODipine (NORVASC) 2.5 MG tablet Take 2.5 mg by mouth every morning.    [provider]  ARIPiprazole (ABILIFY) 10 MG tablet TAKE ONE TABLET BY MOUTH ONE TIME DAILY Patient taking differently: Take 10 mg by mouth at bedtime. 08/25/23   Jomarie Longs, MD  benztropine (COGENTIN) 0.5 MG tablet Take 1 tablet (0.5 mg total) by mouth daily as needed for tremors. Patient not taking: Reported on 11/18/2023 10/13/22   Jomarie Longs, MD  escitalopram (LEXAPRO) 10 MG tablet TAKE ONE TABLET BY MOUTH ONE TIME DAILY Patient taking differently: Take 10 mg by mouth at bedtime. 10/08/23   Jomarie Longs, MD  furosemide (LASIX) 20 MG tablet Take 10 mg by mouth daily as needed. 01/30/20   [provider]  glimepiride (AMARYL) 2 MG tablet Take 2 mg by mouth every morning.     [provider]  HYDROcodone-acetaminophen (NORCO) 10-325 MG tablet Take 1 tablet by mouth every 8 (eight) hours as needed for severe pain (pain score 7-10). Must last 30 days. Patient taking differently: Take 1 tablet by mouth every 8 (eight) hours. Must last 30 days. 11/03/23 12/03/23  Edward Jolly, MD  hydrOXYzine (VISTARIL) 25 MG capsule Take 25 mg by mouth daily as needed for anxiety. Patient not taking: Reported on 11/18/2023 09/09/21   [provider]  lamoTRIgine (LAMICTAL) 200 MG tablet TAKE ONE TABLET BY MOUTH ONE TIME DAILY Patient taking differently: Take 200 mg by mouth at bedtime. 10/08/23   Jomarie Longs, MD  meclizine (ANTIVERT) 25 MG tablet Take 25 mg by mouth 2 (two) times daily. Patient not taking: Reported on 11/18/2023 05/02/19   [provider]  metFORMIN (GLUCOPHAGE) 500 MG tablet Take 500 mg by mouth 2 (two) times daily with a meal.    [provider]  OZEMPIC, 1 MG/DOSE, 4 MG/3ML SOPN Inject 1 mg into the skin once a week. Sundays 12/08/21  [provider]  pantoprazole (PROTONIX) 40 MG tablet Take 40 mg by mouth every morning.    [provider]  polyethylene glycol powder (GLYCOLAX/MIRALAX) 17 GM/SCOOP powder Take 1 Container by mouth daily. 07/15/22   [provider]  spironolactone (ALDACTONE) 25 MG tablet Take 25 mg by mouth every morning.    [provider]  tizanidine (ZANAFLEX) 2 MG capsule Take 2 mg by mouth 3 (three) times daily as needed for muscle spasms. Takes differently Patient not taking: Reported on 11/18/2023    [provider]  XIFAXAN 550 MG TABS tablet Take 550 mg by mouth 2 (two) times daily. 10/29/23   [provider]     Family History  Problem Relation Age of Onset   Lung cancer Mother    Heart attack Father 75   Mental illness Father    Bipolar disorder Brother    Bipolar disorder Paternal Uncle    Bipolar disorder Paternal Uncle     Social History    Socioeconomic History   Marital status: Divorced    Spouse name: Not on file   Number of children: 2   Years of education: Not on file   Highest education level: Not on file  Occupational History   Not on file  Tobacco Use   Smoking status: Former    Current packs/day: 0.00    Types: Cigarettes    Quit date: 03/03/2011    Years since quitting: 12.7   Smokeless tobacco: Never  Vaping Use   Vaping status: Never Used  Substance and Sexual Activity   Alcohol use: Yes    Comment: rarely   Drug use: Not Currently   Sexual activity: Not Currently  Other Topics Concern   Not on file  Social History Narrative   Not on file   Social Drivers of Health   Financial Resource Strain: Low Risk  (06/29/2023)   Received from [facility]   Overall Financial Resource Strain (CARDIA)    Difficulty of Paying Living Expenses: Not hard at all  Food Insecurity: No Food Insecurity (06/29/2023)   Received from [facility]   Hunger Vital Sign    Worried About Running Out of Food in the Last Year: Never true    Ran Out of Food in the Last Year: Never true  Transportation Needs: No Transportation Needs (06/29/2023)   Received from [facility] - Transportation    In the past 12 months, has lack of transportation kept you from medical appointments or from getting medications?: No    Lack of Transportation (Non-Medical): No  Physical Activity: Inactive (06/16/2021)   Received from [facility]   Exercise Vital Sign  Stress: No Stress Concern Present (06/16/2021)   Received from [facility] - Occupational Stress Questionnaire  Social Connections: Socially Isolated (06/16/2021)   Received from [facility]   Social Connection and Isolation Panel [NHANES]    Review of Systems: A 12 point ROS discussed and pertinent positives are indicated in the  HPI above.  All other systems are negative.  Review of Systems  Constitutional:  Negative for fever.  Respiratory:  Negative for cough and shortness of breath.   Cardiovascular:  Negative for chest pain.  Gastrointestinal:  Negative for abdominal pain, diarrhea and vomiting.    Vital Signs: BP (!) 158/59   Pulse 67   Temp 98.2 F (36.8 C) (  Oral)   Resp 18   Ht 5' (1.524 m)   Wt 173 lb (78.5 kg)   SpO2 98%   BMI 33.79 kg/m   Advance Care Plan: The advanced care plan/surrogate decision maker was discussed at the time of visit and documented in the medical record.    Physical Exam HENT:     Head: Normocephalic and atraumatic.     Mouth/Throat:     Mouth: Mucous membranes are moist.     Pharynx: Oropharynx is clear.  Eyes:     Extraocular Movements: Extraocular movements intact.  Cardiovascular:     Rate and Rhythm: Normal rate and regular rhythm.     Comments: Aortic stenosis  Pulmonary:     Effort: Pulmonary effort is normal. No respiratory distress.  Abdominal:     Palpations: Abdomen is soft.     Tenderness: There is no abdominal tenderness.  Skin:    General: Skin is warm.  Neurological:     General: No focal deficit present.     Mental Status: She is alert and oriented to person, place, and time.  Psychiatric:        Mood and Affect: Mood normal.        Thought Content: Thought content normal.     Imaging: MR ABDOMEN WWO CONTRAST Result Date: 11/18/2023 CLINICAL DATA:  Cirrhosis.  Follow up renal lesion EXAM: MRI ABDOMEN WITHOUT AND WITH CONTRAST TECHNIQUE: Multiplanar multisequence MR imaging of the abdomen was performed both before and after the administration of intravenous contrast. CONTRAST:  7mL GADAVIST GADOBUTROL 1 MMOL/ML IV SOLN COMPARISON:  Ultrasound 10/12/2023.  MRI 08/03/2023. FINDINGS: Lower chest: No pleural effusion lung bases. Hepatobiliary: There is a nodular liver identified. There is lattice like enhancement persistent in the delayed  datasets consistent with fibrosis. Overall the liver is micronodular. Portal vein is patent. Diameter of the main portal vein at the liver hilum measures 11 mm. There is a component of fatty infiltration with a some signal dropout on out of phase imaging. Areas of sparing along the gallbladder fossa margin. No hypervascular liver lesion identified. No restricted diffusion in the liver. Previous cholecystectomy. No biliary ductal dilatation more than expected for the post cholecystectomy state. Pancreas: No mass, inflammatory changes, or other parenchymal abnormality identified. Spleen: Mildly enlarged at 15 cm. Preserved signal and enhancement. Multiple left upper quadrant varices identified including near the stomach, splenic hilum. Component of the splenorenal shunt suggested. Adrenals/Urinary Tract: Adrenal glands are preserved. No renal collecting system dilatation. There are several Bosniak 1 and 2 renal cysts bilaterally, many of which are under a cm. Bright on T2, low on T1 and no clear abnormal enhancement. There is 1 tiny bright T1 lesion anterior along the left mid kidney on series 13, image 76 measuring 3 mm. This has dark on T2. Likely a proteinaceous or hemorrhagic. The small focus along the lower pole left kidney with some dependent bright T1 signal measuring 11 mm. Again no clear abnormal enhancement. These are unchanged. No aggressive left-sided renal lesion. The right kidney has a bright T2, low T1 lesion peripherally and posterior along the right mid kidney which has some thin septations. Minimal nodularity best seen on coronal series 20, image 18. Lesion measures 16 by 12 by 10 mm today. On the prior examination when measured in the same fashion 16 by 13 by 12 mm. The septations seen today has a thickness at less than 3 mm. Stomach/Bowel: Moderate colonic stool along the visualized portions of the colon. Stomach  is underdistended. The visualized small bowel is nondilated. Vascular/Lymphatic:  Normal caliber aorta and IVC. Again multiple varices identified in the retroperitoneum in the left upper quadrant. There are some small nodes in the porta hepatis. Example series 16, image 47 measuring 8 x 11 mm. Second focus just medial and inferior on image 50. These correspond to the findings by ultrasound. There are several other prominent nodes identified near the gastrohepatic ligament, celiac region. Node image 14 image 41 anterior to the celiac measures 18 x 20 mm and previously 19 x 22 mm. These are similar to the older MRI as well. Other:  Trace ascites Musculoskeletal: Curvature of the spine.  Degenerative changes. IMPRESSION: Evidence of chronic liver disease with a micronodular liver with the areas of fibrosis, trace ascites and prominent left-sided varices. Mild splenomegaly. No aggressive hypervascular liver mass at this time. Prominent upper abdominal retroperitoneal nodes are similar to previous. Attention on follow-up. Bilateral Bosniak 1 and 2 renal cystic lesions. The more complex lesion along the midportion of the right kidney posteriorly is similar to previous examination. Recommend continued surveillance in 6 months. Electronically Signed   By: Karen Kays M.D.   On: 11/18/2023 19:25   IR Radiologist Eval & Mgmt Result Date: 10/28/2023 EXAM: NEW PATIENT OFFICE VISIT CHIEF COMPLAINT: SEE NOTE IN EPIC HISTORY OF PRESENT ILLNESS: SEE NOTE IN EPIC REVIEW OF SYSTEMS: SEE NOTE IN EPIC PHYSICAL EXAMINATION: SEE NOTE IN EPIC ASSESSMENT AND PLAN: SEE NOTE IN EPIC Electronically Signed   By: Malachy Moan M.D.   On: 10/28/2023 13:07    Labs:  CBC: Recent Labs    06/21/23 1413 07/12/23 1416 08/23/23 0844 10/11/23 0926  WBC 5.4 4.6 4.8 4.1  HGB 8.9* 9.4* 10.0* 9.2*  HCT 29.2* 30.7* 31.4* 29.2*  PLT 109* 115* 119* 104*    COAGS: Recent Labs    01/08/23 1445 01/19/23 1248 01/29/23 1246 04/05/23 1034 11/22/23 0800  INR 1.4* 1.3*  --  1.2 1.2  APTT  --   --  32 29  --      BMP: Recent Labs    07/12/23 1416 08/23/23 0844 10/11/23 0926 11/22/23 0800  NA 136 137 137 137  K 4.6 4.1 4.7 4.3  CL 104 102 103 103  CO2 26 27 24 26   GLUCOSE 166* 94 161* 274*  BUN 18 19 21 14   CALCIUM 9.2 9.2 9.3 8.9  CREATININE 1.18* 1.01* 1.06* 0.86  GFRNONAA 51* >60 58* >60    LIVER FUNCTION TESTS: Recent Labs    06/07/23 1321 07/12/23 1416 08/23/23 0844 10/11/23 0926  BILITOT 0.8 0.8 0.6 1.0  AST 29 29 28 25   ALT 20 21 18 20   ALKPHOS 79 80 86 70  PROT 6.9 6.5 6.9 6.7  ALBUMIN 4.0 3.7 4.1 3.8    TUMOR MARKERS: No results for input(s): "AFPTM", "CEA", "CA199", "CHROMGRNA" in the last 8760 hours.  Assessment and Plan: Margaret [name] is a 68 y.o. female with history of aortic stenosis, heart failure, bipolar 1 disorder, diabetes mellitus, GERD, HLD, anemia, HTN, NASH, osteoporosis, osteoarthritis, and ventral hernia. Patient was referred to IR by Riki Altes, MD. Patient is followed by Dr. Lonna Cobb for a right renal mass. This was initially found incidentally on MRI  03/30/2023. This study reported "small complex cystic 1.2 cm posterior to mid to lower right renal cortical mass with mildly thickened enhancing internal septation." Patient had a repeat MRI 08/03/23, which showed the cyst increased in size. IR intervention was recommended at  her visit 08/26/23. Patient had a consultation with Dr. Archer Asa 10/28/23.  Patient reports that she does not have any symptoms. The lesion was found incidentally on a MRI that was done for her liver. Patient reports that surveillance was recommended for the lesion. However, it has grown in size over the last 6 months. Patient decided to proceed with a non invasive option before it grew further in size. Patient's son is present today. Patient denies: chest pain, shortness of breath, vomiting, fever, abdominal pain, vomiting, and/or diarrhea.    Risks and benefits of renal tumor ablation was discussed with the patient and/or  patient's family including, but not limited to bleeding, infection, damage to adjacent structures or low yield requiring additional tests.  All of the questions were answered and there is agreement to proceed.  Consent signed and in chart.  Thank you for this interesting consult.  I greatly enjoyed meeting [facility] and look forward to participating in their care.  A copy of this report was sent to the requesting provider on this date.  Electronically Signed: Rosalita Levan, PA 11/22/2023, 8:50 AM   I spent a total of    15 Minutes in face to face in clinical consultation, greater than 50% of which was counseling/coordinating care for

## 2023-11-22 NOTE — Transfer of Care (Signed)
Immediate Anesthesia Transfer of Care Note  Patient: Margaret [name]  Procedure(s) Performed: CT RENAL TUMOR ABLATION UNILATERAL  Patient Location: PACU  Anesthesia Type:General  Level of Consciousness: awake, alert , and oriented  Airway & Oxygen Therapy: Patient Spontanous Breathing and Patient connected to face mask oxygen  Post-op Assessment: Report given to RN and Post -op Vital signs reviewed and stable  Post vital signs: stable  Last Vitals:  Vitals Value Taken Time  BP 168/63 11/22/23 1035  Temp 36.9 C 11/22/23 1035  Pulse 85 11/22/23 1044  Resp 20 11/22/23 1044  SpO2 100 % 11/22/23 1044  Vitals shown include unfiled device data.  Last Pain:  Vitals:   11/22/23 1035  TempSrc:   PainSc: Asleep         Complications: No notable events documented.

## 2023-11-22 NOTE — Progress Notes (Signed)
Removed By Armstead Peaks Prior NT3 prior to discharge

## 2023-11-22 NOTE — Anesthesia Preprocedure Evaluation (Addendum)
Anesthesia Evaluation  Patient identified by MRN, date of birth, ID band Patient awake    Reviewed: Allergy & Precautions, NPO status , Patient's Chart, lab work & pertinent test results  History of Anesthesia Complications Negative for: history of anesthetic complications  Airway Mallampati: II  TM Distance: >3 FB Neck ROM: Full    Dental  (+) Partial Upper   Pulmonary neg pulmonary ROS, neg sleep apnea, neg COPD, Patient abstained from smoking.Not current smoker, former smoker   Pulmonary exam normal breath sounds clear to auscultation       Cardiovascular Exercise Tolerance: Good METShypertension, Pt. on medications + CAD  (-) Past MI (-) dysrhythmias + Valvular Problems/Murmurs AS  Rhythm:Regular Rate:Normal + Systolic murmurs Per cardiologist: "Given patient's past medical history significant for cardiovascular diagnoses, presurgical clearance was sought by the PAT team. Per cardiology, "Ms. Wisler's perioperative risk of a major cardiac event is 0.4% according to the Revised Cardiac Risk Index (RCRI). Therefore, she is at LOW risk for perioperative complications. Her functional capacity is fair, but mostly limited by MSK issues at 4.64 METs according to the [employer] Activity Status Index (DASI). According to ACC/AHA guidelines, no further cardiovascular testing needed.  The patient may proceed to surgery at ACCEPTABLE risk.  She had an echo done in March 2024 which did show moderate aortic stenosis with a gradient of 20 mmHg. She is asymptomatic.  Guideline recommendation will be to reassess in 3 years".   Neuro/Psych  PSYCHIATRIC DISORDERS Anxiety Depression Bipolar Disorder   negative neurological ROS     GI/Hepatic ,GERD  ,,(+) Cirrhosis     (-) substance abuse    Endo/Other  diabetes  GLP1 last taken 2 weeks ago. Denies GI symptoms today  Renal/GU CRFRenal disease     Musculoskeletal  (+) Arthritis ,    Abdominal    Peds  Hematology   Anesthesia Other Findings Past Medical History: No date: (HFpEF) heart failure with preserved ejection fraction (HCC)     Comment:  a.) TTE 12/15/2022: EF >55%, mild LVH, G1DD, mild LAE,               mild MAC, triv AR/MR/TR, mild TR, RVSP 30.7, mod AS (MPG               20) No date: Anxiety No date: Aortic atherosclerosis (HCC) No date: Aortic stenosis     Comment:  a.) TTE 12/15/2022: moderate AS (MPG 20 mmHg; AVA = 1.7               cm2) No date: Bilateral carotid artery disease (HCC)     Comment:  a.) doppler 12/22/2020: <50% BICA No date: Bilateral renal cysts No date: Bipolar 1 disorder (HCC) 11/04/2022: CAD (coronary artery disease)     Comment:  a.) CT chest 11/04/2022: 3v CAD No date: Cardiac murmur No date: CKD (chronic kidney disease), stage III (HCC) No date: DDD (degenerative disc disease), lumbar No date: Depression No date: DM (diabetes mellitus), type 2 (HCC) No date: Former smoker No date: Frequent falls No date: GERD (gastroesophageal reflux disease) No date: H/O: GI bleed No date: Heart murmur No date: History of blood transfusion 2017: History of MRSA infection No date: HLD (hyperlipidemia) No date: Hypertension No date: IDA (iron deficiency anemia) No date: 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H (HCC) No date: Multiple pulmonary nodules No date: Nephrolithiasis No date: Occasional tremors No date: Osteoarthritis No date: Osteoporosis No date: Portal hypertensive gastropathy (HCC) No date: Portal venous hypertension (HCC) 03/30/2023:  Renal mass, right     Comment:  a.) MRI ABD 03/30/2023: 1.2 x 1.2 cm complex cystic               renal cortical mass in the posterior mid to low RIGHT               kidney (Bosniak 3); b.) MRI ABD 08/03/2023: interval               increase in size to 1.4 x 1.3 cm -> concerning for RCC;               c.) [place] ABD 10/12/2023: 1.7 complex cystic mass RIGHT               kidney; d.) MRI ABD 11/15/2023:  interval increase in size              to 1.6 x 1.2 x 1.0 cm No date: Splenomegaly No date: Thoracic spondylosis No date: Thrombocytopenia (HCC) No date: Tubular adenoma of colon No date: Uses walker No date: Venous insufficiency No date: Ventral hernia  Reproductive/Obstetrics                             Anesthesia Physical Anesthesia Plan  ASA: 3  Anesthesia Plan: General   Post-op Pain Management: Ofirmev IV (intra-op)*   Induction: Intravenous  PONV Risk Score and Plan: 3 and Ondansetron, Dexamethasone, Midazolam and Treatment may vary due to age or medical condition  Airway Management Planned: Oral ETT  Additional Equipment: None  Intra-op Plan:   Post-operative Plan: Extubation in OR  Informed Consent: I have reviewed the patients History and Physical, chart, labs and discussed the procedure including the risks, benefits and alternatives for the proposed anesthesia with the patient or authorized representative who has indicated his/her understanding and acceptance.     Dental advisory given  Plan Discussed with: CRNA and Surgeon  Anesthesia Plan Comments: (Discussed risks of anesthesia with patient, including PONV, sore throat, lip/dental/eye damage. Rare risks discussed as well, such as cardiorespiratory and neurological sequelae, and allergic reactions. Discussed the role of CRNA in patient's perioperative care. Patient understands.)       Anesthesia Quick Evaluation

## 2023-11-22 NOTE — Anesthesia Procedure Notes (Signed)
Procedure Name: Intubation Date/Time: 11/22/2023 9:09 AM  Performed by: Darrell Jewel I, CRNAPre-anesthesia Checklist: Patient identified, Patient being monitored, Timeout performed, Emergency Drugs available and Suction available Patient Re-evaluated:Patient Re-evaluated prior to induction Oxygen Delivery Method: Circle system utilized Preoxygenation: Pre-oxygenation with 100% oxygen Induction Type: IV induction Ventilation: Mask ventilation without difficulty and Oral airway inserted - appropriate to patient size Laryngoscope Size: 3 and McGrath Grade View: Grade I Tube type: Oral Tube size: 6.0 mm Number of attempts: 1 Airway Equipment and Method: Stylet Placement Confirmation: ETT inserted through vocal cords under direct vision, positive ETCO2 and breath sounds checked- equal and bilateral Secured at: 20 cm Tube secured with: Tape Dental Injury: Teeth and Oropharynx as per pre-operative assessment

## 2023-11-23 ENCOUNTER — Encounter: Payer: 59 | Admitting: Student in an Organized Health Care Education/Training Program

## 2023-11-23 NOTE — Anesthesia Postprocedure Evaluation (Signed)
Anesthesia Post Note  Patient: Margaret [name]  Procedure(s) Performed: CT RENAL TUMOR ABLATION UNILATERAL  Patient location during evaluation: PACU Anesthesia Type: General Level of consciousness: awake and alert Pain management: pain level controlled Vital Signs Assessment: post-procedure vital signs reviewed and stable Respiratory status: spontaneous breathing, nonlabored ventilation, respiratory function stable and patient connected to nasal cannula oxygen Cardiovascular status: blood pressure returned to baseline and stable Postop Assessment: no apparent nausea or vomiting Anesthetic complications: no   No notable events documented.   Last Vitals:  Vitals:   11/22/23 1335 11/22/23 1400  BP: (!) 132/47 (!) 136/48  Pulse: 85 88  Resp: 17 17  Temp:    SpO2: 96% 95%    Last Pain:  Vitals:   11/22/23 1427  TempSrc:   PainSc: 1                  Corinda Gubler

## 2023-11-30 ENCOUNTER — Ambulatory Visit
Payer: 59 | Attending: Student in an Organized Health Care Education/Training Program | Admitting: Student in an Organized Health Care Education/Training Program

## 2023-11-30 ENCOUNTER — Encounter: Payer: Self-pay | Admitting: Student in an Organized Health Care Education/Training Program

## 2023-11-30 ENCOUNTER — Encounter: Payer: 59 | Admitting: Student in an Organized Health Care Education/Training Program

## 2023-11-30 VITALS — BP 122/47 | HR 61 | Temp 98.1°F | Resp 16 | Ht 60.0 in | Wt 172.0 lb

## 2023-11-30 DIAGNOSIS — Z79891 Long term (current) use of opiate analgesic: Secondary | ICD-10-CM | POA: Insufficient documentation

## 2023-11-30 DIAGNOSIS — M4726 Other spondylosis with radiculopathy, lumbar region: Secondary | ICD-10-CM | POA: Diagnosis not present

## 2023-11-30 DIAGNOSIS — M47816 Spondylosis without myelopathy or radiculopathy, lumbar region: Secondary | ICD-10-CM | POA: Diagnosis present

## 2023-11-30 DIAGNOSIS — M5416 Radiculopathy, lumbar region: Secondary | ICD-10-CM | POA: Diagnosis present

## 2023-11-30 DIAGNOSIS — Z96642 Presence of left artificial hip joint: Secondary | ICD-10-CM | POA: Diagnosis present

## 2023-11-30 DIAGNOSIS — M19011 Primary osteoarthritis, right shoulder: Secondary | ICD-10-CM | POA: Insufficient documentation

## 2023-11-30 DIAGNOSIS — G894 Chronic pain syndrome: Secondary | ICD-10-CM | POA: Insufficient documentation

## 2023-11-30 DIAGNOSIS — M25552 Pain in left hip: Secondary | ICD-10-CM | POA: Insufficient documentation

## 2023-11-30 DIAGNOSIS — G8929 Other chronic pain: Secondary | ICD-10-CM | POA: Insufficient documentation

## 2023-11-30 MED ORDER — HYDROCODONE-ACETAMINOPHEN 10-325 MG PO TABS: 1.0000 | ORAL_TABLET | Freq: Three times a day (TID) | ORAL | 0 refills | Status: AC | PRN

## 2023-11-30 MED ORDER — HYDROCODONE-ACETAMINOPHEN 10-325 MG PO TABS: 1.0000 | ORAL_TABLET | Freq: Three times a day (TID) | ORAL | 0 refills | Status: DC | PRN

## 2023-11-30 NOTE — Progress Notes (Signed)
PROVIDER NOTE: Information contained herein reflects review and annotations entered in association with encounter. Interpretation of such information and data should be left to medically-trained personnel. Information provided to patient can be located elsewhere in the medical record under "Patient Instructions". Document created using STT-dictation technology, any transcriptional errors that may result from process are unintentional.    Patient: Margaret [name]  Service Category: E/M  Provider: Edward Jolly, MD  DOB: 02/17/1956  DOS: 11/30/2023  Referring Provider: Danella Penton, MD  MRN: [id number]  Specialty: Interventional Pain Management  PCP: Danella Penton, MD  Type: Established Patient  Setting: Ambulatory outpatient    Location: Office  Delivery: Face-to-face     HPI  Margaret [name], a 68 y.o. year old female, is here today because of her Encounter for long-term opiate analgesic use [Z79.891]. Margaret [name] primary complain today is Hip Pain (Left s/p joint replacement )  Pertinent problems: Margaret [name] has Chronic pain syndrome; Encounter for long-term opiate analgesic use; Bilateral primary osteoarthritis of knee; SI (sacroiliac) joint dysfunction; History of left hip replacement; Recurrent major depressive episodes, moderate (HCC); Cervical radicular pain; Pain management contract signed; Lumbar facet arthropathy; Chronic right shoulder pain; and Cervical facet joint syndrome on their pertinent problem list. Pain Assessment: Severity of Chronic pain is reported as a 7 /10. Location: Hip Left/denies. Onset: More than a month ago. Quality: Discomfort, Pressure, Constant. Timing: Constant. Modifying factor(s): pain medications,. Vitals:  height is 5' (1.524 m) and weight is 172 lb (78 kg). Her temporal temperature is 98.1 F (36.7 C). Her blood pressure is 122/47 (abnormal) and her pulse is 61. Her respiration is 16 and oxygen saturation is 98%.  BMI: Estimated body mass index is 33.59 kg/m as  calculated from the following:   Height as of this encounter: 5' (1.524 m).   Weight as of this encounter: 172 lb (78 kg). Last encounter: 08/24/2023. Last procedure: 06/09/2023.  Reason for encounter:  Discussed the use of AI scribe software for clinical note transcription with the patient, who gave verbal consent to proceed.  History of Present Illness   The patient presents with left hip pain following two falls. She is accompanied by her daughter-in-law.  She experienced two falls, the first occurring five weeks ago when her left leg gave way as she stepped down. The second fall happened last Friday at a playground where she slid off her walker while sitting. Her left hip is the most bothersome area following these incidents.  She is currently managing her medications and requires refills. She takes glycol daily to prevent constipation and is on Abilify. She has Cogentin as needed but does not take it, and she is no longer taking hydroxyzine or meclizine.  No issues with constipation as she takes glycol daily.       Pharmacotherapy Assessment  Analgesic: Norco 10 mg TID prn   Monitoring: [place] PMP: PDMP reviewed during this encounter.       Pharmacotherapy: No side-effects or adverse reactions reported. Compliance: No problems identified. Effectiveness: Clinically acceptable.  Vernie Ammons, RN  11/30/2023 10:56 AM  Sign when Signing Visit Nursing Pain Medication Assessment:  Safety precautions to be maintained throughout the outpatient stay will include: orient to surroundings, keep bed in low position, maintain call bell within reach at all times, provide assistance with transfer out of bed and ambulation.  Medication Inspection Compliance: Pill count conducted under aseptic conditions, in front of the patient. Neither the pills nor the bottle was removed from  the patient's sight at any time. Once count was completed pills were immediately returned to the patient in their  original bottle.  Medication: Hydrocodone/APAP Pill/Patch Count:  17 of 90 pills remain Pill/Patch Appearance: Markings consistent with prescribed medication Bottle Appearance: Standard pharmacy container. Clearly labeled. Filled Date: 01 / 02 / 2025 Last Medication intake:  Today  No results found for: "CBDTHCR" No results found for: "D8THCCBX" No results found for: "D9THCCBX"  UDS:  Summary  Date Value Ref Range Status  06/01/2023 Note  Final    Comment:    ==================================================================== [employer] 13 (MW) ==================================================================== Test                             Result       Flag       Units  Drug Present and Declared for Prescription Verification   Hydrocodone                    5552         EXPECTED   ng/mg creat   Hydromorphone                  124          EXPECTED   ng/mg creat   Dihydrocodeine                 554          EXPECTED   ng/mg creat   Norhydrocodone                 2725         EXPECTED   ng/mg creat    Sources of hydrocodone include scheduled prescription medications.    Hydromorphone, dihydrocodeine and norhydrocodone are expected    metabolites of hydrocodone. Hydromorphone and dihydrocodeine are    also available as scheduled prescription medications.  ==================================================================== Test                      Result    Flag   Units      Ref Range   Creatinine              71               mg/dL      >=16 ==================================================================== Declared Medications:  The flagging and interpretation on this report are based on the  following declared medications.  Unexpected results may arise from  inaccuracies in the declared medications.   **Note: The testing scope of this panel includes these medications:   Hydrocodone (Norco)   **Note: The testing scope of this panel does not include the   following reported medications:   Acetaminophen (Norco)  Alendronate  Amlodipine  Aripiprazole (Abilify)  Benztropine (Cogentin)  Escitalopram (Lexapro)  Furosemide (Lasix)  Glimepiride  Hydroxyzine  Meclizine (Antivert)  Metformin  Pantoprazole (Protonix)  Polyethylene Glycol (MiraLAX)  Rifaximin (Xifaxan)  Semaglutide (Ozempic)  Spironolactone  Tizanidine ==================================================================== For clinical consultation, please call [phone]. ====================================================================       ROS  Constitutional: Denies any fever or chills Gastrointestinal: No reported hemesis, hematochezia, vomiting, or acute GI distress Musculoskeletal:  left hip pain Neurological: No reported episodes of acute onset apraxia, aphasia, dysarthria, agnosia, amnesia, paralysis, loss of coordination, or loss of consciousness  Medication Review  ARIPiprazole, HYDROcodone-acetaminophen, Semaglutide (1 MG/DOSE), amLODipine, escitalopram, furosemide, glimepiride, lamoTRIgine, metFORMIN, pantoprazole, polyethylene glycol powder, rifaximin, spironolactone, and tizanidine  History Review  Allergy: Margaret [name] is allergic to lithium, penicillins, sulfa antibiotics, and benzonatate. Drug: Margaret [name]  reports that she does not currently use drugs. Alcohol:  reports current alcohol use. Tobacco:  reports that she quit smoking about 12 years ago. Her smoking use included cigarettes. She has never used smokeless tobacco. Social: Ms. Grudzien  reports that she quit smoking about 12 years ago. Her smoking use included cigarettes. She has never used smokeless tobacco. She reports current alcohol use. She reports that she does not currently use drugs. Medical:  has a past medical history of (HFpEF) heart failure with preserved ejection fraction (HCC), Anxiety, Aortic atherosclerosis (HCC), Aortic stenosis, Bilateral carotid artery disease (HCC),  Bilateral renal cysts, Bipolar 1 disorder (HCC), CAD (coronary artery disease) (11/04/2022), Cardiac murmur, CKD (chronic kidney disease), stage III (HCC), DDD (degenerative disc disease), lumbar, Depression, DM (diabetes mellitus), type 2 (HCC), Former smoker, Frequent falls, GERD (gastroesophageal reflux disease), H/O: GI bleed, Heart murmur, History of blood transfusion, History of MRSA infection (2017), HLD (hyperlipidemia), Hypertension, IDA (iron deficiency anemia), 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H [facility]), Multiple pulmonary nodules, Nephrolithiasis, Occasional tremors, Osteoarthritis, Osteoporosis, Portal hypertensive gastropathy (HCC), Portal venous hypertension (HCC), Renal mass, 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 Surgical: Ms. Kniskern  has a past surgical history that includes Abdominal hysterectomy; Tonsillectomy; Cholecystectomy; Esophagogastroduodenoscopy (egd) with propofol (N/A, 06/23/2019); Colonoscopy with propofol (N/A, 06/23/2019); Esophagogastroduodenoscopy (egd) with propofol (N/A, 12/08/2022); Colonoscopy with propofol (N/A, 12/08/2022); Esophagogastroduodenoscopy (egd) with propofol (N/A, 01/12/2023); Colonoscopy with propofol (N/A, 05/18/2023); polypectomy (05/18/2023); IR Radiologist Eval & Mgmt (10/28/2023); and Total hip arthroplasty (Right, 2017). Family: family history includes Bipolar disorder in her brother, paternal uncle, and paternal uncle; Heart attack (age of onset: 16) in her father; Lung cancer in her mother; Mental illness in her father.  Laboratory Chemistry Profile   Renal Lab Results  Component Value Date   BUN 14 11/22/2023   CREATININE 0.86 11/22/2023   BCR 19 12/06/2019   GFRAA 114 12/06/2019   GFRNONAA >60 11/22/2023    Hepatic Lab Results  Component Value Date   AST 25 10/11/2023   ALT 20 10/11/2023   ALBUMIN 3.8 10/11/2023    ALKPHOS 70 10/11/2023    Electrolytes Lab Results  Component Value Date   NA 137 11/22/2023   K 4.3 11/22/2023   CL 103 11/22/2023   CALCIUM 8.9 11/22/2023    Bone No results found for: "VD25OH", "VD125OH2TOT", "ZH0865HQ4", "ON6295MW4", "25OHVITD1", "25OHVITD2", "25OHVITD3", "TESTOFREE", "TESTOSTERONE"  Inflammation (CRP: Acute Phase) (ESR: Chronic Phase) No results found for: "CRP", "ESRSEDRATE", "LATICACIDVEN"       Note: Above Lab results reviewed.  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 She is a poor operative candidate due to her underlying cirrhosis and portal hypertension. Therefore, she presents for percutaneous thermal ablation of the renal lesion.  EXAM: Percutaneous thermal ablation of right renal mass  COMPARISON:  MRI abdomen 11/15/2023  MEDICATIONS: None.  ANESTHESIA/SEDATION: General - as administered by the Anesthesia department  FLUOROSCOPY: None  COMPLICATIONS: None immediate.  TECHNIQUE: Informed written consent was obtained from the patient after a thorough discussion of the procedural risks, benefits and alternatives. All questions were addressed. Maximal Sterile Barrier Technique was utilized including caps, mask, sterile gowns, sterile gloves, sterile drape, hand hygiene and skin  antiseptic. A timeout was performed prior to the initiation of the procedure.  A planning axial CT scan was performed. The low-attenuation lesion arising partially exophytic from the posterior aspect of the right kidney was successfully identified. A suitable skin entry site was selected and marked. The skin was sterilely prepped and draped in the standard fashion using chlorhexidine skin prep. Local anesthesia was attained by infiltration with 1% lidocaine. A small  dermatotomy was made.  Under intermittent CT guidance, a 15 cm NeuWave PR microwave antenna was advanced into the mass. Once antenna placement was confirmed with additional axial CT imaging, ablation was carried out. The probe was powered at 65 watts in the ablation maintained for 5 minutes. The probe was then removed.  Follow-up CT scan with and without contrast demonstrates a well located ablation zone. No evidence of urine leak or hemorrhage. The patient tolerated the procedure well.  FINDINGS: Successful percutaneous thermal ablation out complication.  IMPRESSION: Successful percutaneous thermal ablation of right renal neoplasm without evidence of immediate complication.  Electronically Signed   By: Malachy Moan M.D.   On: 11/22/2023 13:12 Note: Reviewed        Physical Exam  General appearance: Well nourished, well developed, and well hydrated. In no apparent acute distress Mental status: Alert, oriented x 3 (person, place, & time)       Respiratory: No evidence of acute respiratory distress Eyes: PERLA Vitals: BP (!) 122/47 (BP Location: Left Arm, Patient Position: Sitting, Cuff Size: Normal)   Pulse 61   Temp 98.1 F (36.7 C) (Temporal)   Resp 16   Ht 5' (1.524 m)   Wt 172 lb (78 kg)   SpO2 98%   BMI 33.59 kg/m  BMI: Estimated body mass index is 33.59 kg/m as calculated from the following:   Height as of this encounter: 5' (1.524 m).   Weight as of this encounter: 172 lb (78 kg). Ideal: Ideal body weight: 45.5 kg (100 lb 4.9 oz) Adjusted ideal body weight: 58.5 kg (128 lb 15.8 oz)  Left hip pain, worse with WB  Assessment   Diagnosis  1. Encounter for long-term opiate analgesic use   2. History of left hip replacement (2017)   3. Lumbar facet arthropathy   4. Chronic radicular lumbar pain   5. Primary osteoarthritis of right shoulder   6. Chronic pain syndrome   7. Left hip pain      Updated Problems: No problems updated.  Plan of Care   Problem-specific:  Assessment and Plan    Left Hip Pain Post-Fall Reports left hip pain after two falls, one five weeks ago and another recent fall at a playground. Pain is localized to the left hip. Discussed monitoring pain for another week versus obtaining an x-ray to rule out fractures or other injuries. An x-ray can be done on a walk-in basis if pain does not improve by next week, but it is advised to consider getting the x-ray today for convenience. Order a left hip x-ray and monitor hip pain for one week. If pain persists, obtain an x-ray at a walk-in clinic.    Margaret [name] has a current medication list which includes the following long-term medication(s): aripiprazole, escitalopram, furosemide, lamotrigine, metformin, pantoprazole, and spironolactone.  Pharmacotherapy (Medications Ordered): Meds ordered this encounter  Medications   HYDROcodone-acetaminophen (NORCO) 10-325 MG tablet    Sig: Take 1 tablet by mouth every 8 (eight) hours as needed for severe pain (pain score 7-10). Must last 30 days.  Dispense:  90 tablet    Refill:  0    Chronic Pain: STOP Act (Not applicable) Fill 1 day early if closed on refill date. Avoid benzodiazepines within 8 hours of opioids   HYDROcodone-acetaminophen (NORCO) 10-325 MG tablet    Sig: Take 1 tablet by mouth every 8 (eight) hours as needed for severe pain (pain score 7-10). Must last 30 days.    Dispense:  90 tablet    Refill:  0    Chronic Pain: STOP Act (Not applicable) Fill 1 day early if closed on refill date. Avoid benzodiazepines within 8 hours of opioids   HYDROcodone-acetaminophen (NORCO) 10-325 MG tablet    Sig: Take 1 tablet by mouth every 8 (eight) hours as needed for severe pain (pain score 7-10). Must last 30 days.    Dispense:  90 tablet    Refill:  0    Chronic Pain: STOP Act (Not applicable) Fill 1 day early if closed on refill date. Avoid benzodiazepines within 8 hours of opioids   Orders:  Orders Placed This  Encounter  Procedures   DG HIP UNILAT W OR W/O PELVIS 2-3 VIEWS LEFT    Please describe any evidence of DJD, such as joint narrowing, asymmetry, cysts, or any anomalies in bone density, production, or erosion.    Standing Status:   Future    Expiration Date:   12/31/2023    Scheduling Instructions:     Please make sure that the patient understands that this needs to be done as soon as possible. Never have the patient do the imaging "just before the next appointment". Inform patient that having the imaging done within the [facility] will expedite the availability of the results and will provide      imaging availability to the requesting physician. In addition inform the patient that the imaging order has an expiration date and will not be renewed if not done within the active period.    Reason for Exam (SYMPTOM  OR DIAGNOSIS REQUIRED):   Left hip pain/arthralgia    Preferred imaging location?:   [facility]    Call Results- Best Contact Number?:   [phone] [facility])    Release to patient:   Immediate   Follow-up plan:   Return in about 3 months (around 02/28/2024) for MM, F2F.      Interventional management options: Planned, scheduled, and/or pending:    Intra-articular knee Hyalgan series #1 done 08/28/20   Considering:   Lumbar epidural steroid injection, lumbar facet medial branch nerve block pending lumbar MRI   PRN Procedures:   None at this time          Recent Visits No visits were found meeting these conditions. Showing recent visits within past 90 days and meeting all other requirements Today's Visits Date Type Provider Dept  11/30/23 Office Visit Edward Jolly, MD [facility]  Showing today's visits and meeting all other requirements Future Appointments Date Type Provider Dept  02/24/24 Appointment Edward Jolly, MD [facility]  Showing future appointments within next 90 days and meeting all other requirements  I  discussed the assessment and treatment plan with the patient. The patient was provided an opportunity to ask questions and all were answered. The patient agreed with the plan and demonstrated an understanding of the instructions.  Patient advised to call back or seek an in-person evaluation if the symptoms or condition worsens.  Duration of encounter: .  Total time on encounter, as per AMA guidelines  included both the face-to-face and non-face-to-face time personally spent by the physician and/or other qualified health care professional(s) on the day of the encounter (includes time in activities that require the physician or other qualified health care professional and does not include time in activities normally performed by clinical staff). Physician's time may include the following activities when performed: Preparing to see the patient (e.g., pre-charting review of records, searching for previously ordered imaging, lab work, and nerve conduction tests) Review of prior analgesic pharmacotherapies. Reviewing PMP Interpreting ordered tests (e.g., lab work, imaging, nerve conduction tests) Performing post-procedure evaluations, including interpretation of diagnostic procedures Obtaining and/or reviewing separately obtained history Performing a medically appropriate examination and/or evaluation Counseling and educating the patient/family/caregiver Ordering medications, tests, or procedures Referring and communicating with other health care professionals (when not separately reported) Documenting clinical information in the electronic or other health record Independently interpreting results (not separately reported) and communicating results to the patient/ family/caregiver Care coordination (not separately reported)  Note by: Edward Jolly, MD Date: 11/30/2023; Time: 11:27 AM

## 2023-11-30 NOTE — Patient Instructions (Signed)

## 2023-11-30 NOTE — Progress Notes (Signed)
Nursing Pain Medication Assessment:  Safety precautions to be maintained throughout the outpatient stay will include: orient to surroundings, keep bed in low position, maintain call bell within reach at all times, provide assistance with transfer out of bed and ambulation.  Medication Inspection Compliance: Pill count conducted under aseptic conditions, in front of the patient. Neither the pills nor the bottle was removed from the patient's sight at any time. Once count was completed pills were immediately returned to the patient in their original bottle.  Medication: Hydrocodone/APAP Pill/Patch Count:  17 of 90 pills remain Pill/Patch Appearance: Markings consistent with prescribed medication Bottle Appearance: Standard pharmacy container. Clearly labeled. Filled Date: 01 / 02 / 2025 Last Medication intake:  Today

## 2023-12-02 ENCOUNTER — Encounter: Payer: Self-pay | Admitting: Urology

## 2023-12-07 ENCOUNTER — Ambulatory Visit
Admission: RE | Admit: 2023-12-07 | Discharge: 2023-12-07 | Disposition: A | Discharge status: Home / Self Care | Payer: 59 | Attending: Student in an Organized Health Care Education/Training Program | Admitting: Student in an Organized Health Care Education/Training Program

## 2023-12-07 ENCOUNTER — Ambulatory Visit
Admission: RE | Admit: 2023-12-07 | Discharge: 2023-12-07 | Disposition: A | Discharge status: Home / Self Care | Payer: 59 | Source: Ambulatory Visit | Attending: Student in an Organized Health Care Education/Training Program | Admitting: Student in an Organized Health Care Education/Training Program

## 2023-12-07 DIAGNOSIS — Z96642 Presence of left artificial hip joint: Secondary | ICD-10-CM | POA: Diagnosis present

## 2023-12-07 DIAGNOSIS — M25552 Pain in left hip: Secondary | ICD-10-CM | POA: Diagnosis present

## 2023-12-14 ENCOUNTER — Encounter: Payer: Self-pay | Admitting: Student in an Organized Health Care Education/Training Program

## 2023-12-30 ENCOUNTER — Encounter: Payer: Self-pay | Admitting: Internal Medicine

## 2023-12-31 ENCOUNTER — Telehealth: Payer: Self-pay | Admitting: *Deleted

## 2023-12-31 NOTE — Telephone Encounter (Signed)
 Pt says it has been a while from having the iv iron, sheis vert tured and wanted to see if she can get appt for labs  and then let her know if she needs IV iron

## 2024-01-03 ENCOUNTER — Encounter: Payer: Self-pay | Admitting: Internal Medicine

## 2024-01-05 ENCOUNTER — Encounter: Payer: Self-pay | Admitting: Internal Medicine

## 2024-01-05 ENCOUNTER — Encounter: Payer: Self-pay | Admitting: Urology

## 2024-01-05 ENCOUNTER — Inpatient Hospital Stay: Attending: Internal Medicine

## 2024-01-05 DIAGNOSIS — K922 Gastrointestinal hemorrhage, unspecified: Secondary | ICD-10-CM | POA: Insufficient documentation

## 2024-01-05 DIAGNOSIS — D500 Iron deficiency anemia secondary to blood loss (chronic): Secondary | ICD-10-CM | POA: Diagnosis present

## 2024-01-05 DIAGNOSIS — D649 Anemia, unspecified: Secondary | ICD-10-CM

## 2024-01-05 LAB — CMP (CANCER CENTER ONLY)
ALT: 20 U/L (ref 0–44)
AST: 29 U/L (ref 15–41)
Albumin: 3.7 g/dL (ref 3.5–5.0)
Alkaline Phosphatase: 101 U/L (ref 38–126)
Anion gap: 9 (ref 5–15)
BUN: 16 mg/dL (ref 8–23)
CO2: 24 mmol/L (ref 22–32)
Calcium: 8.8 mg/dL — ABNORMAL LOW (ref 8.9–10.3)
Chloride: 100 mmol/L (ref 98–111)
Creatinine: 1.04 mg/dL — ABNORMAL HIGH (ref 0.44–1.00)
GFR, Estimated: 59 mL/min — ABNORMAL LOW (ref >60)
Glucose, Bld: 225 mg/dL — ABNORMAL HIGH (ref 70–99)
Potassium: 4.5 mmol/L (ref 3.5–5.1)
Sodium: 133 mmol/L — ABNORMAL LOW (ref 135–145)
Total Bilirubin: 0.9 mg/dL (ref 0.0–1.2)
Total Protein: 6.8 g/dL (ref 6.5–8.1)

## 2024-01-05 LAB — CBC WITH DIFFERENTIAL (CANCER CENTER ONLY)
Abs Immature Granulocytes: 0.03 10*3/uL (ref 0.00–0.07)
Basophils Absolute: 0 10*3/uL (ref 0.0–0.1)
Basophils Relative: 0 %
Eosinophils Absolute: 0.1 10*3/uL (ref 0.0–0.5)
Eosinophils Relative: 1 %
HCT: 26.8 % — ABNORMAL LOW (ref 36.0–46.0)
Hemoglobin: 8.4 g/dL — ABNORMAL LOW (ref 12.0–15.0)
Immature Granulocytes: 1 %
Lymphocytes Relative: 17 %
Lymphs Abs: 0.9 10*3/uL (ref 0.7–4.0)
MCH: 25.1 pg — ABNORMAL LOW (ref 26.0–34.0)
MCHC: 31.3 g/dL (ref 30.0–36.0)
MCV: 80 fL (ref 80.0–100.0)
Monocytes Absolute: 0.5 10*3/uL (ref 0.1–1.0)
Monocytes Relative: 10 %
Neutro Abs: 3.9 10*3/uL (ref 1.7–7.7)
Neutrophils Relative %: 71 %
Platelet Count: 116 10*3/uL — ABNORMAL LOW (ref 150–400)
RBC: 3.35 MIL/uL — ABNORMAL LOW (ref 3.87–5.11)
RDW: 14.6 % (ref 11.5–15.5)
WBC Count: 5.5 10*3/uL (ref 4.0–10.5)
nRBC: 0 % (ref 0.0–0.2)

## 2024-01-05 LAB — IRON AND TIBC
Iron: 32 ug/dL (ref 28–170)
Saturation Ratios: 6 % — ABNORMAL LOW (ref 10.4–31.8)
TIBC: 574 ug/dL — ABNORMAL HIGH (ref 250–450)
UIBC: 542 ug/dL

## 2024-01-05 LAB — FERRITIN: Ferritin: 11 ng/mL (ref 11–307)

## 2024-01-06 ENCOUNTER — Encounter: Payer: Self-pay | Admitting: *Deleted

## 2024-01-06 ENCOUNTER — Encounter: Payer: Self-pay | Admitting: Internal Medicine

## 2024-01-10 ENCOUNTER — Other Ambulatory Visit: Payer: Self-pay | Admitting: Interventional Radiology

## 2024-01-10 ENCOUNTER — Inpatient Hospital Stay

## 2024-01-10 VITALS — BP 120/45 | HR 71 | Temp 97.3°F | Resp 16

## 2024-01-10 DIAGNOSIS — D500 Iron deficiency anemia secondary to blood loss (chronic): Secondary | ICD-10-CM | POA: Diagnosis not present

## 2024-01-10 DIAGNOSIS — N2889 Other specified disorders of kidney and ureter: Secondary | ICD-10-CM

## 2024-01-10 MED ORDER — SODIUM CHLORIDE 0.9% FLUSH
10.0000 mL | Freq: Once | INTRAVENOUS | Status: AC | PRN
  Administered 2024-01-10: 10 mL
  Filled 2024-01-10: qty 10

## 2024-01-10 MED ORDER — IRON SUCROSE 20 MG/ML IV SOLN
200.0000 mg | Freq: Once | INTRAVENOUS | Status: AC
Start: 2024-01-10 — End: 2024-01-10
  Administered 2024-01-10: 200 mg via INTRAVENOUS

## 2024-01-10 NOTE — Progress Notes (Signed)
 Patient declined to wait the 30 minutes for post iron infusion observation today. Tolerated infusion well. VSS.

## 2024-01-13 ENCOUNTER — Ambulatory Visit

## 2024-01-18 ENCOUNTER — Ambulatory Visit
Admission: RE | Admit: 2024-01-18 | Discharge: 2024-01-18 | Disposition: A | Discharge status: Home / Self Care | Source: Ambulatory Visit | Attending: Interventional Radiology | Admitting: Interventional Radiology

## 2024-01-18 DIAGNOSIS — N2889 Other specified disorders of kidney and ureter: Secondary | ICD-10-CM

## 2024-01-18 HISTORY — PX: IR RADIOLOGIST EVAL & MGMT: IMG5224

## 2024-01-18 NOTE — Progress Notes (Signed)
 Chief Complaint: Patient was consulted remotely today (TeleHealth) for right renal neoplasm at the request of Tiyona Desouza K.    Referring Physician(s): Edvardo Honse K  History of Present Illness: Margaret [name] is a 68 y.o. 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   She underwent percutaneous thermal ablation on November 22, 2023.  We had a video conference today to evaluate her postoperative course.  She reports that the postprocedural course was uneventful.  She had essentially no pain, no hematuria or other symptoms.  She felt as if the procedure was very easy on her.  She is back to her full activities.  She has no complaints at this time.  Past Medical History:  Diagnosis Date   (HFpEF) heart failure with preserved ejection fraction (HCC)    a.) TTE 12/15/2022: EF >55%, mild LVH, G1DD, mild LAE, mild MAC, triv AR/MR/TR, mild TR, RVSP 30.7, mod AS (MPG 20)   Anxiety    Aortic atherosclerosis (HCC)    Aortic stenosis    a.) TTE 12/15/2022: moderate AS (MPG 20 mmHg; AVA = 1.7 cm2)   Bilateral carotid artery disease (HCC)    a.) doppler 12/22/2020: <50% BICA   Bilateral renal cysts    Bipolar 1 disorder (HCC)    CAD (coronary artery disease) 11/04/2022   a.) CT chest 11/04/2022: 3v CAD   Cardiac murmur    CKD (chronic kidney  disease), stage III (HCC)    DDD (degenerative disc disease), lumbar    Depression    DM (diabetes mellitus), type 2 (HCC)    Former smoker    Frequent falls    GERD (gastroesophageal reflux disease)    H/O: GI bleed    Heart murmur    History of blood transfusion    History of MRSA infection 2017   HLD (hyperlipidemia)    Hypertension    IDA (iron deficiency anemia)    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H [facility])    Multiple pulmonary nodules    Nephrolithiasis    Occasional tremors    Osteoarthritis    Osteoporosis    Portal hypertensive gastropathy (HCC)    Portal venous hypertension (HCC)    Renal mass, right 03/30/2023   a.) MRI ABD 03/30/2023: 1.2 x 1.2 cm complex cystic renal cortical mass in the posterior mid to low RIGHT kidney (Bosniak 3); b.) MRI ABD 08/03/2023: interval increase in size to 1.4 x 1.3 cm -> concerning for RCC; c.) [place] ABD 10/12/2023: 1.7 complex cystic mass RIGHT kidney; d.) MRI ABD 11/15/2023: interval increase in size to 1.6 x 1.2 x 1.0 cm   Splenomegaly    Thoracic spondylosis    Thrombocytopenia (HCC)    Tubular adenoma of colon    Uses walker    Venous insufficiency    Ventral hernia     Past Surgical History:  Procedure Laterality Date   ABDOMINAL HYSTERECTOMY     CHOLECYSTECTOMY  COLONOSCOPY WITH PROPOFOL N/A 06/23/2019   Procedure: COLONOSCOPY WITH PROPOFOL;  Surgeon: Wyline Mood, MD;  Location: [facility] ENDOSCOPY;  Service: Gastroenterology;  Laterality: N/A;   COLONOSCOPY WITH PROPOFOL N/A 12/08/2022   Procedure: COLONOSCOPY WITH PROPOFOL;  Surgeon: Regis Bill, MD;  Location: [facility] ENDOSCOPY;  Service: Endoscopy;  Laterality: N/A;   COLONOSCOPY WITH PROPOFOL N/A 05/18/2023   Procedure: COLONOSCOPY WITH PROPOFOL;  Surgeon: Regis Bill, MD;  Location: [facility] ENDOSCOPY;  Service: Endoscopy;  Laterality: N/A;   ESOPHAGOGASTRODUODENOSCOPY (EGD) WITH PROPOFOL N/A 06/23/2019   Procedure: ESOPHAGOGASTRODUODENOSCOPY (EGD) WITH PROPOFOL;   Surgeon: Wyline Mood, MD;  Location: [facility] ENDOSCOPY;  Service: Gastroenterology;  Laterality: N/A;   ESOPHAGOGASTRODUODENOSCOPY (EGD) WITH PROPOFOL N/A 12/08/2022   Procedure: ESOPHAGOGASTRODUODENOSCOPY (EGD) WITH PROPOFOL;  Surgeon: Regis Bill, MD;  Location: [facility] ENDOSCOPY;  Service: Endoscopy;  Laterality: N/A;  DM, OZEMPIC   ESOPHAGOGASTRODUODENOSCOPY (EGD) WITH PROPOFOL N/A 01/12/2023   Procedure: ESOPHAGOGASTRODUODENOSCOPY (EGD) WITH PROPOFOL;  Surgeon: Regis Bill, MD;  Location: [facility] ENDOSCOPY;  Service: Endoscopy;  Laterality: N/A;  DM   IR RADIOLOGIST EVAL & MGMT  10/28/2023   IR RADIOLOGIST EVAL & MGMT  01/18/2024   POLYPECTOMY  05/18/2023   Procedure: POLYPECTOMY;  Surgeon: Regis Bill, MD;  Location: [facility] ENDOSCOPY;  Service: Endoscopy;;   TONSILLECTOMY     TOTAL HIP ARTHROPLASTY Right 2017    Allergies: Lithium, Penicillins, Sulfa antibiotics, and Benzonatate  Medications: Prior to Admission medications   Medication Sig Start Date End Date Taking? Authorizing Provider  amLODipine (NORVASC) 2.5 MG tablet Take 2.5 mg by mouth every morning.    [provider]  ARIPiprazole (ABILIFY) 10 MG tablet TAKE ONE TABLET BY MOUTH ONE TIME DAILY Patient taking differently: Take 10 mg by mouth at bedtime. 08/25/23   Jomarie Longs, MD  escitalopram (LEXAPRO) 10 MG tablet TAKE ONE TABLET BY MOUTH ONE TIME DAILY Patient taking differently: Take 10 mg by mouth at bedtime. 10/08/23   Jomarie Longs, MD  furosemide (LASIX) 20 MG tablet Take 10 mg by mouth daily as needed. 01/30/20   [provider]  glimepiride (AMARYL) 2 MG tablet Take 2 mg by mouth every morning.    [provider]  HYDROcodone-acetaminophen (NORCO) 10-325 MG tablet Take 1 tablet by mouth every 8 (eight) hours as needed for severe pain (pain score 7-10). Must last 30 days. 01/03/24 02/02/24  Edward Jolly, MD  HYDROcodone-acetaminophen (NORCO) 10-325 MG tablet Take 1 tablet by  mouth every 8 (eight) hours as needed for severe pain (pain score 7-10). Must last 30 days. 02/02/24 03/03/24  Edward Jolly, MD  lamoTRIgine (LAMICTAL) 200 MG tablet TAKE ONE TABLET BY MOUTH ONE TIME DAILY Patient taking differently: Take 200 mg by mouth at bedtime. 10/08/23   Jomarie Longs, MD  metFORMIN (GLUCOPHAGE) 500 MG tablet Take 500 mg by mouth 2 (two) times daily with a meal.    [provider]  OZEMPIC, 1 MG/DOSE, 4 MG/3ML SOPN Inject 1 mg into the skin once a week. Sundays 12/08/21   [provider]  pantoprazole (PROTONIX) 40 MG tablet Take 40 mg by mouth every morning.    [provider]  polyethylene glycol powder (GLYCOLAX/MIRALAX) 17 GM/SCOOP powder Take 1 Container by mouth daily. 07/15/22   [provider]  spironolactone (ALDACTONE) 25 MG tablet Take 25 mg by mouth every morning.    [provider]  tizanidine (ZANAFLEX) 2 MG capsule Take 2 mg by mouth 3 (three) times daily as needed for muscle  spasms. Takes differently    [provider]  XIFAXAN 550 MG TABS tablet Take 550 mg by mouth 2 (two) times daily. 10/29/23   [provider]     Family History  Problem Relation Age of Onset   Lung cancer Mother    Heart attack Father 55   Mental illness Father    Bipolar disorder Brother    Bipolar disorder Paternal Uncle    Bipolar disorder Paternal Uncle     Social History   Socioeconomic History   Marital status: Divorced    Spouse name: Not on file   Number of children: 2   Years of education: Not on file   Highest education level: Not on file  Occupational History   Not on file  Tobacco Use   Smoking status: Former    Current packs/day: 0.00    Types: Cigarettes    Quit date: 03/03/2011    Years since quitting: 12.8   Smokeless tobacco: Never  Vaping Use   Vaping status: Never Used  Substance and Sexual Activity   Alcohol use: Yes    Comment: rarely   Drug use: Not Currently   Sexual activity: Not  Currently  Other Topics Concern   Not on file  Social History Narrative   Not on file   Social Drivers of Health   Financial Resource Strain: Low Risk  (06/29/2023)   Received from [facility]   Overall Financial Resource Strain (CARDIA)    Difficulty of Paying Living Expenses: Not hard at all  Food Insecurity: No Food Insecurity (06/29/2023)   Received from [facility]   Hunger Vital Sign    Worried About Running Out of Food in the Last Year: Never true    Ran Out of Food in the Last Year: Never true  Transportation Needs: No Transportation Needs (06/29/2023)   Received from [facility] - Transportation    In the past 12 months, has lack of transportation kept you from medical appointments or from getting medications?: No    Lack of Transportation (Non-Medical): No  Physical Activity: Inactive (06/16/2021)   Received from [facility]   Exercise Vital Sign  Stress: No Stress Concern Present (06/16/2021)   Received from [facility] - Occupational Stress Questionnaire  Social Connections: Socially Isolated (06/16/2021)   Received from [facility]   Social Connection and Isolation Panel [NHANES]    ECOG Status: 0 - Asymptomatic  Review of Systems  Review of Systems: A 12 point ROS discussed and pertinent positives are indicated in the HPI above.  All other systems are negative.  Advance Care Plan: The advanced care plan/surrogate decision maker was discussed at the time of visit and the patient did not wish to discuss or was not able to name a surrogate decision maker or provide an advance care plan.    Physical Exam No direct physical exam was performed (except for noted visual exam findings with Video Visits).    Vital Signs: There were no vitals taken for this visit.  Imaging: IR Radiologist Eval & Mgmt Result  Date: 01/18/2024 EXAM: NEW PATIENT OFFICE VISIT CHIEF COMPLAINT: SEE NOTE IN EPIC HISTORY OF PRESENT ILLNESS: SEE NOTE IN EPIC REVIEW OF SYSTEMS: SEE NOTE IN EPIC PHYSICAL EXAMINATION: SEE NOTE IN EPIC ASSESSMENT AND PLAN: SEE NOTE IN EPIC Electronically Signed   By: Isac Caddy.D.  On: 01/18/2024 15:10    Labs:  CBC: Recent Labs    08/23/23 0844 10/11/23 0926 11/22/23 0800 01/05/24 1038  WBC 4.8 4.1 4.4 5.5  HGB 10.0* 9.2* 10.2* 8.4*  HCT 31.4* 29.2* 32.1* 26.8*  PLT 119* 104* 90* 116*    COAGS: Recent Labs    01/19/23 1248 01/29/23 1246 04/05/23 1034 11/22/23 0800  INR 1.3*  --  1.2 1.2  APTT  --  32 29  --     BMP: Recent Labs    08/23/23 0844 10/11/23 0926 11/22/23 0800 01/05/24 1038  NA 137 137 137 133*  K 4.1 4.7 4.3 4.5  CL 102 103 103 100  CO2 27 24 26 24   GLUCOSE 94 161* 274* 225*  BUN 19 21 14 16   CALCIUM 9.2 9.3 8.9 8.8*  CREATININE 1.01* 1.06* 0.86 1.04*  GFRNONAA >60 58* >60 59*    LIVER FUNCTION TESTS: Recent Labs    07/12/23 1416 08/23/23 0844 10/11/23 0926 01/05/24 1038  BILITOT 0.8 0.6 1.0 0.9  AST 29 28 25 29   ALT 21 18 20 20   ALKPHOS 80 86 70 101  PROT 6.5 6.9 6.7 6.8  ALBUMIN 3.7 4.1 3.8 3.7    TUMOR MARKERS: No results for input(s): "AFPTM", "CEA", "CA199", "CHROMGRNA" in the last 8760 hours.  Assessment and Plan:  Very pleasant 68 year old female 2 months status post percutaneous microwave ablation of right sided renal neoplasm.  She is doing well and is completely recovered from her procedure.  We will pursue routine follow-up imaging.  1.)  Follow-up MRI of the abdomen with gadolinium contrast in July followed by a clinic visit.     Electronically Signed: Sterling Big 01/18/2024, 3:14 PM   I spent a total of  10 Minutes in remote  clinical consultation, greater than 50% of which was counseling/coordinating care for Right renal neoplasm.    Visit type: Audio and video Financial planner).   Alternative  for in-person consultation at [facility], [address] [place], [place], [place]. This visit type was conducted due to national recommendations for restrictions regarding the COVID-19 Pandemic (e.g. social distancing).  This format is felt to be most appropriate for this patient at this time.  All issues noted in this document were discussed and addressed.

## 2024-01-19 ENCOUNTER — Inpatient Hospital Stay

## 2024-01-19 VITALS — BP 126/47 | HR 75 | Temp 97.6°F | Resp 16

## 2024-01-19 DIAGNOSIS — D500 Iron deficiency anemia secondary to blood loss (chronic): Secondary | ICD-10-CM

## 2024-01-19 MED ORDER — IRON SUCROSE 20 MG/ML IV SOLN
200.0000 mg | Freq: Once | INTRAVENOUS | Status: AC
  Administered 2024-01-19: 200 mg via INTRAVENOUS

## 2024-01-19 MED ORDER — SODIUM CHLORIDE 0.9% FLUSH
10.0000 mL | Freq: Once | INTRAVENOUS | Status: AC | PRN
  Administered 2024-01-19: 10 mL
  Filled 2024-01-19: qty 10

## 2024-01-20 ENCOUNTER — Ambulatory Visit

## 2024-01-21 ENCOUNTER — Ambulatory Visit: Payer: Self-pay | Admitting: Psychiatry

## 2024-01-21 ENCOUNTER — Encounter: Payer: Self-pay | Admitting: Psychiatry

## 2024-01-21 ENCOUNTER — Telehealth: Admitting: Psychiatry

## 2024-01-21 DIAGNOSIS — F418 Other specified anxiety disorders: Secondary | ICD-10-CM

## 2024-01-21 DIAGNOSIS — F3161 Bipolar disorder, current episode mixed, mild: Secondary | ICD-10-CM

## 2024-01-21 MED ORDER — BENZTROPINE MESYLATE 0.5 MG PO TABS: 0.5000 mg | ORAL_TABLET | Freq: Two times a day (BID) | ORAL | 1 refills | Status: DC | PRN

## 2024-01-21 MED ORDER — LAMOTRIGINE 25 MG PO TABS: 25.0000 mg | ORAL_TABLET | Freq: Every day | ORAL | 0 refills | Status: DC

## 2024-01-21 NOTE — Progress Notes (Signed)
 Virtual Visit via Video Note  I connected with Margaret [name] on 01/21/24 at 10:40 AM EDT by a video enabled telemedicine application and verified that I am speaking with the correct person using two identifiers.  Location Provider Location : [facility] Patient Location : Home  Participants: Patient , Provider    I discussed the limitations of evaluation and management by telemedicine and the availability of in person appointments. The patient expressed understanding and agreed to proceed.  I discussed the assessment and treatment plan with the patient. The patient was provided an opportunity to ask questions and all were answered. The patient agreed with the plan and demonstrated an understanding of the instructions.   The patient was advised to call back or seek an in-person evaluation if the symptoms worsen or if the condition fails to improve as anticipated.   BH MD OP Progress Note  01/21/2024 7:53 PM Margaret [name]  MRN:  [id number]  Chief Complaint:  Chief Complaint  Patient presents with   Follow-up   Depression   Manic Behavior   Anxiety   Medication Refill   HPI: Margaret [name] is a 68 year old Caucasian female, lives in [place], has a history of bipolar disorder, other specified anxiety disorder, hyperlipidemia, liver cirrhosis, right renal cyst, thrombocytopenia, chronic pain was evaluated by telemedicine today.  She experiences episodes of sudden sadness and depression once or twice a week, lasting about 15 to 20 minutes. During these episodes, she feels 'super sad' and cries without any apparent reason, despite usually being calm and easygoing. She also feels jittery and nervous during these episodes. She has been staying home more often and questions why she feels depressed again. No recent use of hydroxyzine to manage these symptoms.  She mentions experiencing restless leg syndrome, with symptoms occurring frequently at night, causing her to constantly move her legs and  preventing relaxation. She is unsure if she has been taking benztropine (Cogentin) for these symptoms.  She has been receiving iron infusions for anemia over the past two weeks and will continue for the next two weeks. She reports feeling tired and cold, and her hemoglobin levels have been low. She also has been experiencing skin bruising and bleeding.  Her past medical history includes a recent discovery of a growth on her kidney, for which she underwent a procedure to address it. She is scheduled for MRIs every six months to monitor the situation. She has not been informed if the growth is cancerous, and she is concerned about the possibility of cancer.  Her current medications include Abilify 10 mg, Lexapro 10 mg,Lamictal 200 mg.   She takes care of her grandson for about an hour a day after school and engages in puzzles with him. She reads small magazines but finds it difficult to stay awake when reading books.  Denies thoughts of self-harm or harm to others. She reports frequent urination at night but is able to return to sleep easily.  Visit Diagnosis:    ICD-10-CM   1. Bipolar 1 disorder, mixed, mild (HCC)  F31.61 benztropine (COGENTIN) 0.5 MG tablet    lamoTRIgine (LAMICTAL) 25 MG tablet    2. Other specified anxiety disorders  F41.8 benztropine (COGENTIN) 0.5 MG tablet    lamoTRIgine (LAMICTAL) 25 MG tablet   With limited symptom attacks      Past Psychiatric History: I have reviewed past psychiatric history from progress note on 08/21/2019.  Past trials of Depakote, Xanax.  Past Medical History:  Past Medical History:  Diagnosis Date   (  HFpEF) heart failure with preserved ejection fraction (HCC)    a.) TTE 12/15/2022: EF >55%, mild LVH, G1DD, mild LAE, mild MAC, triv AR/MR/TR, mild TR, RVSP 30.7, mod AS (MPG 20)   Anxiety    Aortic atherosclerosis (HCC)    Aortic stenosis    a.) TTE 12/15/2022: moderate AS (MPG 20 mmHg; AVA = 1.7 cm2)   Bilateral carotid artery disease  (HCC)    a.) doppler 12/22/2020: <50% BICA   Bilateral renal cysts    Bipolar 1 disorder (HCC)    CAD (coronary artery disease) 11/04/2022   a.) CT chest 11/04/2022: 3v CAD   Cardiac murmur    CKD (chronic kidney disease), stage III (HCC)    DDD (degenerative disc disease), lumbar    Depression    DM (diabetes mellitus), type 2 (HCC)    Former smoker    Frequent falls    GERD (gastroesophageal reflux disease)    H/O: GI bleed    Heart murmur    History of blood transfusion    History of MRSA infection 2017   HLD (hyperlipidemia)    Hypertension    IDA (iron deficiency anemia)    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H [facility])    Multiple pulmonary nodules    Nephrolithiasis    Occasional tremors    Osteoarthritis    Osteoporosis    Portal hypertensive gastropathy (HCC)    Portal venous hypertension (HCC)    Renal mass, right 03/30/2023   a.) MRI ABD 03/30/2023: 1.2 x 1.2 cm complex cystic renal cortical mass in the posterior mid to low RIGHT kidney (Bosniak 3); b.) MRI ABD 08/03/2023: interval increase in size to 1.4 x 1.3 cm -> concerning for RCC; c.) [place] ABD 10/12/2023: 1.7 complex cystic mass RIGHT kidney; d.) MRI ABD 11/15/2023: interval increase in size to 1.6 x 1.2 x 1.0 cm   Splenomegaly    Thoracic spondylosis    Thrombocytopenia (HCC)    Tubular adenoma of colon    Uses walker    Venous insufficiency    Ventral hernia     Past Surgical History:  Procedure Laterality Date   ABDOMINAL HYSTERECTOMY     CHOLECYSTECTOMY     COLONOSCOPY WITH PROPOFOL N/A 06/23/2019   Procedure: COLONOSCOPY WITH PROPOFOL;  Surgeon: Wyline Mood, MD;  Location: [facility] ENDOSCOPY;  Service: Gastroenterology;  Laterality: N/A;   COLONOSCOPY WITH PROPOFOL N/A 12/08/2022   Procedure: COLONOSCOPY WITH PROPOFOL;  Surgeon: Regis Bill, MD;  Location: [facility] ENDOSCOPY;  Service: Endoscopy;  Laterality: N/A;   COLONOSCOPY WITH PROPOFOL N/A 05/18/2023   Procedure: COLONOSCOPY WITH PROPOFOL;  Surgeon:  Regis Bill, MD;  Location: [facility] ENDOSCOPY;  Service: Endoscopy;  Laterality: N/A;   ESOPHAGOGASTRODUODENOSCOPY (EGD) WITH PROPOFOL N/A 06/23/2019   Procedure: ESOPHAGOGASTRODUODENOSCOPY (EGD) WITH PROPOFOL;  Surgeon: Wyline Mood, MD;  Location[facility] ENDOSCOPY;  Service: Gastroenterology;  Laterality: N/A;   ESOPHAGOGASTRODUODENOSCOPY (EGD) WITH PROPOFOL N/A 12/08/2022   Procedure: ESOPHAGOGASTRODUODENOSCOPY (EGD) WITH PROPOFOL;  Surgeon: Regis Bill, MD;  Location: [facility] ENDOSCOPY;  Service: Endoscopy;  Laterality: N/A;  DM, OZEMPIC   ESOPHAGOGASTRODUODENOSCOPY (EGD) WITH PROPOFOL N/A 01/12/2023   Procedure: ESOPHAGOGASTRODUODENOSCOPY (EGD) WITH PROPOFOL;  Surgeon: Regis Bill, MD;  Location: [facility] ENDOSCOPY;  Service: Endoscopy;  Laterality: N/A;  DM   IR RADIOLOGIST EVAL & MGMT  10/28/2023   IR RADIOLOGIST EVAL & MGMT  01/18/2024   POLYPECTOMY  05/18/2023   Procedure: POLYPECTOMY;  Surgeon: Regis Bill, MD;  Location: [facility] ENDOSCOPY;  Service: Endoscopy;;  TONSILLECTOMY     TOTAL HIP ARTHROPLASTY Right 2017    Family Psychiatric History: I have reviewed family psychiatric history from progress note on 08/21/2019.  Family History:  Family History  Problem Relation Age of Onset   Lung cancer Mother    Heart attack Father 62   Mental illness Father    Bipolar disorder Brother    Bipolar disorder Paternal Uncle    Bipolar disorder Paternal Uncle     Social History: I have reviewed social history from progress note on 08/21/2019. Social History   Socioeconomic History   Marital status: Divorced    Spouse name: Not on file   Number of children: 2   Years of education: Not on file   Highest education level: Not on file  Occupational History   Not on file  Tobacco Use   Smoking status: Former    Current packs/day: 0.00    Types: Cigarettes    Quit date: 03/03/2011    Years since quitting: 12.8   Smokeless tobacco: Never  Vaping Use   Vaping  status: Never Used  Substance and Sexual Activity   Alcohol use: Yes    Comment: rarely   Drug use: Not Currently   Sexual activity: Not Currently  Other Topics Concern   Not on file  Social History Narrative   Not on file   Social Drivers of Health   Financial Resource Strain: Low Risk  (06/29/2023)   Received from [facility]   Overall Financial Resource Strain (CARDIA)    Difficulty of Paying Living Expenses: Not hard at all  Food Insecurity: No Food Insecurity (06/29/2023)   Received from [facility]   Hunger Vital Sign    Worried About Running Out of Food in the Last Year: Never true    Ran Out of Food in the Last Year: Never true  Transportation Needs: No Transportation Needs (06/29/2023)   Received from [facility] - Transportation    In the past 12 months, has lack of transportation kept you from medical appointments or from getting medications?: No    Lack of Transportation (Non-Medical): No  Physical Activity: Inactive (06/16/2021)   Received from [facility]   Exercise Vital Sign  Stress: No Stress Concern Present (06/16/2021)   Received from [facility] - Occupational Stress Questionnaire  Social Connections: Socially Isolated (06/16/2021)   Received from [facility]   Social Connection and Isolation Panel [NHANES]    Allergies:  Allergies  Allergen Reactions   Lithium Nausea And Vomiting and Other (See Comments)   Penicillins Hives    Resulted in hospitalization   Sulfa Antibiotics Hives    Resulted in hospitalization   Benzonatate Other (See Comments)    Metabolic Disorder Labs: Lab Results  Component Value Date   HGBA1C 7.3 (H) 06/22/2019   MPG 162.81 06/22/2019   No results found for: "PROLACTIN" No results found for: "CHOL", "TRIG", "HDL", "CHOLHDL", "VLDL", "LDLCALC" Lab Results  Component  Value Date   TSH 1.463 12/29/2022   TSH 1.737 11/16/2019    Therapeutic Level Labs: No results found for: "LITHIUM" No results found for: "VALPROATE" No results found for: "CBMZ"  Current Medications: Current Outpatient Medications  Medication Sig Dispense Refill   amLODipine (NORVASC) 2.5 MG tablet Take 2.5 mg by mouth every morning.     ARIPiprazole (ABILIFY) 10 MG tablet TAKE ONE TABLET  BY MOUTH ONE TIME DAILY (Patient taking differently: Take 10 mg by mouth at bedtime.) 90 tablet 1   benztropine (COGENTIN) 0.5 MG tablet Take 1 tablet (0.5 mg total) by mouth 2 (two) times daily as needed for tremors. 60 tablet 1   escitalopram (LEXAPRO) 10 MG tablet TAKE ONE TABLET BY MOUTH ONE TIME DAILY (Patient taking differently: Take 10 mg by mouth at bedtime.) 90 tablet 3   furosemide (LASIX) 20 MG tablet Take 10 mg by mouth daily as needed.     glimepiride (AMARYL) 2 MG tablet Take 2 mg by mouth every morning.     HYDROcodone-acetaminophen (NORCO) 10-325 MG tablet Take 1 tablet by mouth every 8 (eight) hours as needed for severe pain (pain score 7-10). Must last 30 days. 90 tablet 0   [START ON 02/02/2024] HYDROcodone-acetaminophen (NORCO) 10-325 MG tablet Take 1 tablet by mouth every 8 (eight) hours as needed for severe pain (pain score 7-10). Must last 30 days. 90 tablet 0   lamoTRIgine (LAMICTAL) 200 MG tablet TAKE ONE TABLET BY MOUTH ONE TIME DAILY (Patient taking differently: Take 200 mg by mouth at bedtime.) 90 tablet 3   lamoTRIgine (LAMICTAL) 25 MG tablet Take 1 tablet (25 mg total) by mouth daily. Take daily with 200 mg , total of 225 mg 90 tablet 0   metFORMIN (GLUCOPHAGE) 500 MG tablet Take 500 mg by mouth 2 (two) times daily with a meal.     OZEMPIC, 1 MG/DOSE, 4 MG/3ML SOPN Inject 1 mg into the skin once a week. Sundays     pantoprazole (PROTONIX) 40 MG tablet Take 40 mg by mouth every morning.     polyethylene glycol powder (GLYCOLAX/MIRALAX) 17 GM/SCOOP powder Take 1 Container by  mouth daily.     spironolactone (ALDACTONE) 25 MG tablet Take 25 mg by mouth every morning.     tizanidine (ZANAFLEX) 2 MG capsule Take 2 mg by mouth 3 (three) times daily as needed for muscle spasms. Takes differently     XIFAXAN 550 MG TABS tablet Take 550 mg by mouth 2 (two) times daily.     No current facility-administered medications for this visit.     Musculoskeletal: Strength & Muscle Tone:  UTA Gait & Station:  Seated Patient leans: N/A  Psychiatric Specialty Exam: Review of Systems  Psychiatric/Behavioral:  Positive for dysphoric mood. The patient is nervous/anxious.     There were no vitals taken for this visit.There is no height or weight on file to calculate BMI.  General Appearance: Casual  Eye Contact:  Fair  Speech:  Clear and Coherent  Volume:  Normal  Mood:  Anxious and Depressed  Affect:  Appropriate  Thought Process:  Goal Directed and Descriptions of Associations: Intact  Orientation:  Full (Time, Place, and Person)  Thought Content: Logical   Suicidal Thoughts:  No  Homicidal Thoughts:  No  Memory:  Immediate;   Fair Recent;   Fair Remote;   Fair  Judgement:  Fair  Insight:  Fair  Psychomotor Activity:  Normal  Concentration:  Concentration: Fair and Attention Span: Fair  Recall:  [employer] of Knowledge: Fair  Language: Fair  Akathisia:  No  Handed:  Right  AIMS (if indicated): not done  Assets:  Desire for Improvement Housing Social Support Transportation  ADL's:  Intact  Cognition: WNL  Sleep:  Fair   Screenings: AIMS    Flowsheet Row Office Visit from 07/23/2023 in [facility] Video Visit from 03/16/2023  in [facility] Office Visit from 10/13/2022 in [facility] Video Visit from 05/14/2022 in [facility] Video Visit from 02/13/2022 in [facility]  AIMS Total Score 0 0 0 0 0      GAD-7    Flowsheet Row Office Visit from 07/23/2023 in [facility] Office Visit from 10/13/2022 in [facility] Video Visit from 02/13/2022 in [facility]  Total GAD-7 Score 2 2 2       PHQ2-9    Flowsheet Row Office Visit from 11/30/2023 in [facility] Visit from 07/23/2023 in [facility] Office Visit from 06/01/2023 in [facility] Video Visit from 03/16/2023 in [facility] Office Visit from 02/18/2023 in [facility] Total Score 1 0 0 0 0      Flowsheet Row Video Visit from 01/21/2024 in [facility] Office Visit from 07/23/2023 in [facility] Admission (Discharged) from 05/18/2023 in [facility]  C-SSRS RISK CATEGORY No Risk No Risk No Risk        Assessment and Plan: Margaret [name] is a 68 year old Caucasian female who has a history of bipolar disorder, chronic pain, currently struggling with mood symptoms, multiple other medical problems, discussed assessment and plan as noted below.  Bipolar Disorder-type I mixed mild-unstable Bipolar disorder previously in remission, now experiencing intermittent depressive episodes characterized by sudden sadness and crying spells occurring once or twice a week, lasting 15-20 minutes. Symptoms may be influenced by seasonal changes and current medical problems. Current medication includes Lamictal 200 mg and Abilify 10 mg. Considering the potential impact of anemia and seasonal mood changes, an increase in  Lamictal dosage is proposed to stabilize mood. - Increase Lamictal dosage by 25 mg to 225 mg daily, with the additional dose taken at lunch. - Continue Abilify 10 mg daily. - Restart Benztropine 0.5 mg twice a day as needed for tremors, restless legs.  Anxiety-unstable Anxiety symptoms previously manageable, now experiencing increased nervousness and jitteriness without identifiable triggers. Symptoms may be exacerbated by anemia and blood sugar variations. - Continue Lexapro 10 mg daily-reduced dosage. - Continue Hydroxyzine 25-50 mg at bedtime as needed. - Discuss symptoms with primary care physician to evaluate for other contributing factors such as anemia and blood sugar variations.  Labs - Reviewed and discussed-hemoglobin A1c-10/25/2023-5.5-within normal limits, lipid panel-within normal limits, CMP-glucose elevated at 151, creatinine elevated at 1.2 and GFR at 50-abnormal.  Patient to continue to follow up with primary care provider and nephrology.  CBC with differential dated 10/25/2023-RBC low at 3.77, hemoglobin 10.3 low, hematocrit low at 32.2 platelet count low at 127.  She is currently receiving iron infusion and is currently under the care of her provider.  TSH-within normal limits 10/25/2023.  Will continue to reevaluate in future sessions.   Follow-up - Follow-up in clinic in 6 to 7 weeks or sooner if needed.  Collaboration of Care: Collaboration of Care: Primary Care Provider AEB patient encouraged to follow up with primary care provider for skin bruising, and continue to follow up with her providers for renal function changes, and abnormal hemoglobin and platelet count.  Patient/Guardian was advised Release of  Information must be obtained prior to any record release in order to collaborate their care with an outside provider. Patient/Guardian was advised if they have not already done so to contact the registration department to sign all necessary forms in order for [place] to release  information regarding their care.   Consent: Patient/Guardian gives verbal consent for treatment and assignment of benefits for services provided during this visit. Patient/Guardian expressed understanding and agreed to proceed.  Discussed the use of a AI scribe software for clinical note transcription with the patient, who gave verbal consent to proceed.  This note was generated in part or whole with voice recognition software. Voice recognition is usually quite accurate but there are transcription errors that can and very often do occur. I apologize for any typographical errors that were not detected and corrected.     Jomarie Longs, MD 01/21/2024, 7:53 PM

## 2024-01-24 ENCOUNTER — Ambulatory Visit

## 2024-02-07 ENCOUNTER — Telehealth: Payer: Self-pay

## 2024-02-07 NOTE — Telephone Encounter (Signed)
 pt was notified and then transfered to front desk to make a sooner appt. pt was also advised to contact the provider that prescribes the tizanidine.

## 2024-02-07 NOTE — Telephone Encounter (Signed)
 pt called states that she not sure which medication is causing her to have more shaking. pt is taking the benztropin and the tizanidine. pt was last seen on 3-21 next appt 5-6

## 2024-02-07 NOTE — Telephone Encounter (Signed)
 Please check with patient if this is a recent change . Usually Benztropine is prescribed for tremors . Does she think this medication is giving side effects of 'tremors". I do not prescribe her Tizanidine . She will have to call her prescriber. She may need to be evaluated in person if she is having problems with medications. Please let her know to schedule a sooner appointment at the office  and in the mean time could stop the Benztropine if she is having side effects.

## 2024-02-09 ENCOUNTER — Ambulatory Visit: Payer: 59 | Admitting: Internal Medicine

## 2024-02-09 ENCOUNTER — Ambulatory Visit: Payer: 59

## 2024-02-09 ENCOUNTER — Other Ambulatory Visit: Payer: 59

## 2024-02-11 ENCOUNTER — Other Ambulatory Visit: Payer: Self-pay | Admitting: *Deleted

## 2024-02-11 DIAGNOSIS — D500 Iron deficiency anemia secondary to blood loss (chronic): Secondary | ICD-10-CM

## 2024-02-14 ENCOUNTER — Inpatient Hospital Stay
Admission: EM | Admit: 2024-02-14 | Discharge: 2024-02-16 | DRG: 378 | Disposition: A | Discharge status: Home / Self Care | Attending: Internal Medicine | Admitting: Internal Medicine

## 2024-02-14 ENCOUNTER — Telehealth: Payer: Self-pay | Admitting: *Deleted

## 2024-02-14 ENCOUNTER — Inpatient Hospital Stay (HOSPITAL_BASED_OUTPATIENT_CLINIC_OR_DEPARTMENT_OTHER): Payer: 59 | Admitting: Internal Medicine

## 2024-02-14 ENCOUNTER — Telehealth: Payer: Self-pay

## 2024-02-14 ENCOUNTER — Inpatient Hospital Stay: Payer: 59 | Attending: Internal Medicine

## 2024-02-14 ENCOUNTER — Inpatient Hospital Stay: Payer: 59

## 2024-02-14 ENCOUNTER — Other Ambulatory Visit: Payer: Self-pay

## 2024-02-14 ENCOUNTER — Encounter: Payer: Self-pay | Admitting: Internal Medicine

## 2024-02-14 VITALS — BP 120/38 | HR 65 | Temp 98.3°F | Resp 16 | Ht 60.0 in | Wt 180.7 lb

## 2024-02-14 DIAGNOSIS — Z87891 Personal history of nicotine dependence: Secondary | ICD-10-CM

## 2024-02-14 DIAGNOSIS — Z96641 Presence of right artificial hip joint: Secondary | ICD-10-CM | POA: Diagnosis present

## 2024-02-14 DIAGNOSIS — K31811 Angiodysplasia of stomach and duodenum with bleeding: Secondary | ICD-10-CM | POA: Diagnosis present

## 2024-02-14 DIAGNOSIS — D649 Anemia, unspecified: Secondary | ICD-10-CM | POA: Diagnosis not present

## 2024-02-14 DIAGNOSIS — K922 Gastrointestinal hemorrhage, unspecified: Principal | ICD-10-CM

## 2024-02-14 DIAGNOSIS — F3176 Bipolar disorder, in full remission, most recent episode depressed: Secondary | ICD-10-CM

## 2024-02-14 DIAGNOSIS — K5521 Angiodysplasia of colon with hemorrhage: Principal | ICD-10-CM | POA: Diagnosis present

## 2024-02-14 DIAGNOSIS — Z7984 Long term (current) use of oral hypoglycemic drugs: Secondary | ICD-10-CM

## 2024-02-14 DIAGNOSIS — Z8719 Personal history of other diseases of the digestive system: Secondary | ICD-10-CM | POA: Diagnosis not present

## 2024-02-14 DIAGNOSIS — K5520 Angiodysplasia of colon without hemorrhage: Secondary | ICD-10-CM

## 2024-02-14 DIAGNOSIS — I5032 Chronic diastolic (congestive) heart failure: Secondary | ICD-10-CM | POA: Diagnosis present

## 2024-02-14 DIAGNOSIS — K7581 Nonalcoholic steatohepatitis (NASH): Secondary | ICD-10-CM | POA: Diagnosis not present

## 2024-02-14 DIAGNOSIS — D696 Thrombocytopenia, unspecified: Secondary | ICD-10-CM | POA: Diagnosis present

## 2024-02-14 DIAGNOSIS — D62 Acute posthemorrhagic anemia: Secondary | ICD-10-CM

## 2024-02-14 DIAGNOSIS — K766 Portal hypertension: Secondary | ICD-10-CM | POA: Diagnosis present

## 2024-02-14 DIAGNOSIS — K7460 Unspecified cirrhosis of liver: Secondary | ICD-10-CM

## 2024-02-14 DIAGNOSIS — Z860101 Personal history of adenomatous and serrated colon polyps: Secondary | ICD-10-CM | POA: Diagnosis not present

## 2024-02-14 DIAGNOSIS — F419 Anxiety disorder, unspecified: Secondary | ICD-10-CM | POA: Diagnosis present

## 2024-02-14 DIAGNOSIS — G2581 Restless legs syndrome: Secondary | ICD-10-CM | POA: Diagnosis present

## 2024-02-14 DIAGNOSIS — K7682 Hepatic encephalopathy: Secondary | ICD-10-CM | POA: Insufficient documentation

## 2024-02-14 DIAGNOSIS — K641 Second degree hemorrhoids: Secondary | ICD-10-CM | POA: Diagnosis present

## 2024-02-14 DIAGNOSIS — I350 Nonrheumatic aortic (valve) stenosis: Secondary | ICD-10-CM | POA: Diagnosis present

## 2024-02-14 DIAGNOSIS — I700 Atherosclerosis of aorta: Secondary | ICD-10-CM | POA: Diagnosis present

## 2024-02-14 DIAGNOSIS — E785 Hyperlipidemia, unspecified: Secondary | ICD-10-CM | POA: Diagnosis present

## 2024-02-14 DIAGNOSIS — N183 Chronic kidney disease, stage 3 unspecified: Secondary | ICD-10-CM

## 2024-02-14 DIAGNOSIS — I130 Hypertensive heart and chronic kidney disease with heart failure and stage 1 through stage 4 chronic kidney disease, or unspecified chronic kidney disease: Secondary | ICD-10-CM | POA: Diagnosis present

## 2024-02-14 DIAGNOSIS — Z7985 Long-term (current) use of injectable non-insulin antidiabetic drugs: Secondary | ICD-10-CM

## 2024-02-14 DIAGNOSIS — N1831 Chronic kidney disease, stage 3a: Secondary | ICD-10-CM | POA: Diagnosis present

## 2024-02-14 DIAGNOSIS — D509 Iron deficiency anemia, unspecified: Secondary | ICD-10-CM

## 2024-02-14 DIAGNOSIS — Z888 Allergy status to other drugs, medicaments and biological substances status: Secondary | ICD-10-CM

## 2024-02-14 DIAGNOSIS — Z880 Allergy status to penicillin: Secondary | ICD-10-CM

## 2024-02-14 DIAGNOSIS — Z8249 Family history of ischemic heart disease and other diseases of the circulatory system: Secondary | ICD-10-CM

## 2024-02-14 DIAGNOSIS — Z79899 Other long term (current) drug therapy: Secondary | ICD-10-CM

## 2024-02-14 DIAGNOSIS — K635 Polyp of colon: Secondary | ICD-10-CM

## 2024-02-14 DIAGNOSIS — R195 Other fecal abnormalities: Secondary | ICD-10-CM | POA: Diagnosis not present

## 2024-02-14 DIAGNOSIS — Z6835 Body mass index (BMI) 35.0-35.9, adult: Secondary | ICD-10-CM

## 2024-02-14 DIAGNOSIS — K2100 Gastro-esophageal reflux disease with esophagitis, without bleeding: Secondary | ICD-10-CM | POA: Diagnosis present

## 2024-02-14 DIAGNOSIS — E66812 Obesity, class 2: Secondary | ICD-10-CM | POA: Diagnosis present

## 2024-02-14 DIAGNOSIS — E1169 Type 2 diabetes mellitus with other specified complication: Secondary | ICD-10-CM

## 2024-02-14 DIAGNOSIS — I2510 Atherosclerotic heart disease of native coronary artery without angina pectoris: Secondary | ICD-10-CM | POA: Diagnosis present

## 2024-02-14 DIAGNOSIS — I10 Essential (primary) hypertension: Secondary | ICD-10-CM | POA: Diagnosis present

## 2024-02-14 DIAGNOSIS — D500 Iron deficiency anemia secondary to blood loss (chronic): Secondary | ICD-10-CM | POA: Diagnosis present

## 2024-02-14 DIAGNOSIS — E119 Type 2 diabetes mellitus without complications: Secondary | ICD-10-CM

## 2024-02-14 DIAGNOSIS — I5030 Unspecified diastolic (congestive) heart failure: Secondary | ICD-10-CM

## 2024-02-14 DIAGNOSIS — F3131 Bipolar disorder, current episode depressed, mild: Secondary | ICD-10-CM | POA: Diagnosis present

## 2024-02-14 DIAGNOSIS — Z882 Allergy status to sulfonamides status: Secondary | ICD-10-CM

## 2024-02-14 DIAGNOSIS — E1122 Type 2 diabetes mellitus with diabetic chronic kidney disease: Secondary | ICD-10-CM | POA: Diagnosis present

## 2024-02-14 LAB — RETICULOCYTES
Immature Retic Fract: 23.3 % — ABNORMAL HIGH (ref 2.3–15.9)
RBC.: 3.13 MIL/uL — ABNORMAL LOW (ref 3.87–5.11)
Retic Count, Absolute: 75.5 10*3/uL (ref 19.0–186.0)
Retic Ct Pct: 2.5 % (ref 0.4–3.1)

## 2024-02-14 LAB — CBC WITH DIFFERENTIAL (CANCER CENTER ONLY)
Abs Immature Granulocytes: 0.04 10*3/uL (ref 0.00–0.07)
Basophils Absolute: 0 10*3/uL (ref 0.0–0.1)
Basophils Relative: 0 %
Eosinophils Absolute: 0.1 10*3/uL (ref 0.0–0.5)
Eosinophils Relative: 1 %
HCT: 18.1 % — ABNORMAL LOW (ref 36.0–46.0)
Hemoglobin: 5.1 g/dL — CL (ref 12.0–15.0)
Immature Granulocytes: 1 %
Lymphocytes Relative: 14 %
Lymphs Abs: 0.8 10*3/uL (ref 0.7–4.0)
MCH: 21.9 pg — ABNORMAL LOW (ref 26.0–34.0)
MCHC: 28.2 g/dL — ABNORMAL LOW (ref 30.0–36.0)
MCV: 77.7 fL — ABNORMAL LOW (ref 80.0–100.0)
Monocytes Absolute: 0.5 10*3/uL (ref 0.1–1.0)
Monocytes Relative: 10 %
Neutro Abs: 3.9 10*3/uL (ref 1.7–7.7)
Neutrophils Relative %: 74 %
Platelet Count: 111 10*3/uL — ABNORMAL LOW (ref 150–400)
RBC: 2.33 MIL/uL — ABNORMAL LOW (ref 3.87–5.11)
RDW: 17.7 % — ABNORMAL HIGH (ref 11.5–15.5)
WBC Count: 5.3 10*3/uL (ref 4.0–10.5)
nRBC: 0 % (ref 0.0–0.2)

## 2024-02-14 LAB — COMPREHENSIVE METABOLIC PANEL WITH GFR
ALT: 19 U/L (ref 0–44)
AST: 30 U/L (ref 15–41)
Albumin: 3.5 g/dL (ref 3.5–5.0)
Alkaline Phosphatase: 84 U/L (ref 38–126)
Anion gap: 10 (ref 5–15)
BUN: 19 mg/dL (ref 8–23)
CO2: 23 mmol/L (ref 22–32)
Calcium: 9 mg/dL (ref 8.9–10.3)
Chloride: 106 mmol/L (ref 98–111)
Creatinine, Ser: 1.06 mg/dL — ABNORMAL HIGH (ref 0.44–1.00)
GFR, Estimated: 58 mL/min — ABNORMAL LOW (ref >60)
Glucose, Bld: 171 mg/dL — ABNORMAL HIGH (ref 70–99)
Potassium: 4.2 mmol/L (ref 3.5–5.1)
Sodium: 139 mmol/L (ref 135–145)
Total Bilirubin: 1 mg/dL (ref 0.0–1.2)
Total Protein: 6.5 g/dL (ref 6.5–8.1)

## 2024-02-14 LAB — BASIC METABOLIC PANEL - CANCER CENTER ONLY
Anion gap: 11 (ref 5–15)
BUN: 18 mg/dL (ref 8–23)
CO2: 22 mmol/L (ref 22–32)
Calcium: 8.9 mg/dL (ref 8.9–10.3)
Chloride: 104 mmol/L (ref 98–111)
Creatinine: 1.03 mg/dL — ABNORMAL HIGH (ref 0.44–1.00)
GFR, Estimated: 60 mL/min — ABNORMAL LOW (ref >60)
Glucose, Bld: 200 mg/dL — ABNORMAL HIGH (ref 70–99)
Potassium: 4.6 mmol/L (ref 3.5–5.1)
Sodium: 137 mmol/L (ref 135–145)

## 2024-02-14 LAB — IRON AND TIBC
Iron: 21 ug/dL — ABNORMAL LOW (ref 28–170)
Iron: 46 ug/dL (ref 28–170)
Saturation Ratios: 4 % — ABNORMAL LOW (ref 10.4–31.8)
Saturation Ratios: 8 % — ABNORMAL LOW (ref 10.4–31.8)
TIBC: 554 ug/dL — ABNORMAL HIGH (ref 250–450)
TIBC: 581 ug/dL — ABNORMAL HIGH (ref 250–450)
UIBC: 533 ug/dL
UIBC: 535 ug/dL

## 2024-02-14 LAB — PROTIME-INR
INR: 1.3 — ABNORMAL HIGH (ref 0.8–1.2)
Prothrombin Time: 16.6 s — ABNORMAL HIGH (ref 11.4–15.2)

## 2024-02-14 LAB — CBC WITH DIFFERENTIAL/PLATELET
Abs Immature Granulocytes: 0.04 10*3/uL (ref 0.00–0.07)
Basophils Absolute: 0 10*3/uL (ref 0.0–0.1)
Basophils Relative: 0 %
Eosinophils Absolute: 0 10*3/uL (ref 0.0–0.5)
Eosinophils Relative: 0 %
HCT: 18.3 % — ABNORMAL LOW (ref 36.0–46.0)
Hemoglobin: 5.2 g/dL — ABNORMAL LOW (ref 12.0–15.0)
Immature Granulocytes: 1 %
Lymphocytes Relative: 14 %
Lymphs Abs: 0.7 10*3/uL (ref 0.7–4.0)
MCH: 22.2 pg — ABNORMAL LOW (ref 26.0–34.0)
MCHC: 28.4 g/dL — ABNORMAL LOW (ref 30.0–36.0)
MCV: 78.2 fL — ABNORMAL LOW (ref 80.0–100.0)
Monocytes Absolute: 0.4 10*3/uL (ref 0.1–1.0)
Monocytes Relative: 8 %
Neutro Abs: 3.8 10*3/uL (ref 1.7–7.7)
Neutrophils Relative %: 77 %
Platelets: 112 10*3/uL — ABNORMAL LOW (ref 150–400)
RBC: 2.34 MIL/uL — ABNORMAL LOW (ref 3.87–5.11)
RDW: 18 % — ABNORMAL HIGH (ref 11.5–15.5)
WBC: 5 10*3/uL (ref 4.0–10.5)
nRBC: 0 % (ref 0.0–0.2)

## 2024-02-14 LAB — HEMOGLOBIN AND HEMATOCRIT, BLOOD
HCT: 24.7 % — ABNORMAL LOW (ref 36.0–46.0)
Hemoglobin: 7.7 g/dL — ABNORMAL LOW (ref 12.0–15.0)

## 2024-02-14 LAB — PREPARE RBC (CROSSMATCH)

## 2024-02-14 LAB — FOLATE: Folate: 10.1 ng/mL (ref >5.9)

## 2024-02-14 LAB — FERRITIN
Ferritin: 8 ng/mL — ABNORMAL LOW (ref 11–307)
Ferritin: 8 ng/mL — ABNORMAL LOW (ref 11–307)

## 2024-02-14 MED ORDER — PEG 3350-KCL-NA BICARB-NACL 420 G PO SOLR
4000.0000 mL | Freq: Once | ORAL | Status: AC
  Administered 2024-02-14: 4000 mL via ORAL
  Filled 2024-02-14: qty 4000

## 2024-02-14 MED ORDER — LAMOTRIGINE 25 MG PO TABS
25.0000 mg | ORAL_TABLET | Freq: Every day | ORAL | Status: DC
  Administered 2024-02-15: 25 mg via ORAL
  Filled 2024-02-14: qty 1

## 2024-02-14 MED ORDER — OCTREOTIDE LOAD VIA INFUSION
50.0000 ug | Freq: Once | INTRAVENOUS | Status: AC
  Administered 2024-02-14: 50 ug via INTRAVENOUS
  Filled 2024-02-14: qty 25

## 2024-02-14 MED ORDER — ONDANSETRON HCL 4 MG PO TABS: 4.0000 mg | ORAL_TABLET | Freq: Four times a day (QID) | ORAL | Status: DC | PRN

## 2024-02-14 MED ORDER — BENZTROPINE MESYLATE 1 MG PO TABS: 0.5000 mg | ORAL_TABLET | Freq: Two times a day (BID) | ORAL | Status: DC | PRN

## 2024-02-14 MED ORDER — SODIUM CHLORIDE 0.9 % IV SOLN: INTRAVENOUS | Status: DC

## 2024-02-14 MED ORDER — ONDANSETRON HCL 4 MG/2ML IJ SOLN: 4.0000 mg | Freq: Four times a day (QID) | INTRAMUSCULAR | Status: DC | PRN

## 2024-02-14 MED ORDER — ARIPIPRAZOLE 10 MG PO TABS: 10.0000 mg | ORAL_TABLET | Freq: Every day | ORAL | 0 refills | Status: DC

## 2024-02-14 MED ORDER — RIFAXIMIN 550 MG PO TABS
550.0000 mg | ORAL_TABLET | Freq: Two times a day (BID) | ORAL | Status: DC
  Administered 2024-02-14 – 2024-02-16 (×4): 550 mg via ORAL
  Filled 2024-02-14 (×5): qty 1

## 2024-02-14 MED ORDER — PANTOPRAZOLE SODIUM 40 MG IV SOLR
40.0000 mg | Freq: Once | INTRAVENOUS | Status: AC
  Administered 2024-02-14: 40 mg via INTRAVENOUS
  Filled 2024-02-14: qty 10

## 2024-02-14 MED ORDER — ARIPIPRAZOLE 10 MG PO TABS
10.0000 mg | ORAL_TABLET | Freq: Every day | ORAL | Status: DC
  Administered 2024-02-14 – 2024-02-15 (×2): 10 mg via ORAL
  Filled 2024-02-14 (×2): qty 1

## 2024-02-14 MED ORDER — HYDROCODONE-ACETAMINOPHEN 10-325 MG PO TABS
1.0000 | ORAL_TABLET | Freq: Four times a day (QID) | ORAL | Status: DC | PRN
  Administered 2024-02-14 – 2024-02-16 (×4): 1 via ORAL
  Filled 2024-02-14 (×4): qty 1

## 2024-02-14 MED ORDER — SODIUM CHLORIDE 0.9% IV SOLUTION
Freq: Once | INTRAVENOUS | Status: DC
  Filled 2024-02-14: qty 250

## 2024-02-14 MED ORDER — SODIUM CHLORIDE 0.9 % IV SOLN
50.0000 ug/h | INTRAVENOUS | Status: DC
  Administered 2024-02-14 – 2024-02-15 (×3): 50 ug/h via INTRAVENOUS
  Filled 2024-02-14 (×5): qty 1

## 2024-02-14 NOTE — Telephone Encounter (Signed)
 received fax requesting a refill on the abilify. pt was last seen on 3-21 next appt 5-6

## 2024-02-14 NOTE — Assessment & Plan Note (Addendum)
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  FEB 2024- Iron sat 9 Ferritin 45- wnl-PCP; Hb 9-10.  s/p IV iron x4 [JAN 2024]- MM panel-WNL.   # Today Hb 5.1 -extremely symptomatic with dizziness and symptomatic shortness of breath.  Recommend urgent evaluation with the emergency room.  Discussed with ER physician.  # Etiology: Cirrhosis/GI bleed FEB 2024- GAVE -non-bleeding-; colo-? Capsule study.  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  Hold off EGD for now-.  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 Stable.   #Liver cirrhosis, splenomegaly, small volume ascites.  Patient does not drink alcohol.? NAFLD-AFP. Patient will need screening ultrasounds [? [facility]]-US - 2024- March - cirrhosis;MAY 2024-MRI per GI - NO malignancy noted. Defer to  re: surveillance.   # Right kidney complex cyst [incidental MAY 2024- MRI]- s/p urology, Dr.Stoioff- MRi pending.    # weight loss sec to oezmpic [40 pounds]- stable. Low concerns for malignancy at this time.   # DISPOSITION: # ER today # follow up in 1 week- APP- labs- cbc/HOLD tube;Venofer- possible D-2 1 unit PRBC-Dr.B

## 2024-02-14 NOTE — Assessment & Plan Note (Signed)
 Continue rifaximin.

## 2024-02-14 NOTE — ED Provider Notes (Signed)
 [facility] Provider Note    Event Date/Time   First MD Initiated Contact with Patient 02/14/24 1138     (approximate)   History   abnormal labs   HPI  Margaret [name] is a 68 y.o. female  who comes in for abnormal hemoglobin of 5. Pt comes in from cancer center. She has diabetes, cirrhosis. Pt gets iron transfusions. Does report increase fatigue, lightheadness. Does report history of GI bleed in the past.   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 Mia Creek.  He did report some black stools 2 weeks prior to that and is on daily pantoprazole.  She      Physical Exam   Triage Vital Signs: ED Triage Vitals [02/14/24 1136]  Encounter Vitals Group     BP (!) 136/51     Systolic BP Percentile      Diastolic BP Percentile      Pulse Rate 69     Resp 17     Temp 98.7 F (37.1 C)     Temp Source Oral     SpO2 100 %     Weight 178 lb 9.2 oz (81 kg)     Height 5' (1.524 m)     Head Circumference      Peak Flow      Pain Score 0     Pain Loc      Pain Education      Exclude from Growth Chart     Most recent vital signs: Vitals:   02/14/24 1136  BP: (!) 136/51  Pulse: 69  Resp: 17  Temp: 98.7 F (37.1 C)  SpO2: 100%     General: Awake, no distress.  CV:  Good peripheral perfusion.  Resp:  Normal effort.  Abd:  No distention. Soft and non tender  Other:  Brown stool Hemoccult positive   ED Results / Procedures / Treatments   Labs (all labs ordered are listed, but only abnormal results are displayed) Labs Reviewed  PROTIME-INR  CBC WITH DIFFERENTIAL/PLATELET  COMPREHENSIVE METABOLIC PANEL WITH GFR  TYPE AND SCREEN     PROCEDURES:  Critical Care performed: yes   .1-3 Lead EKG Interpretation  Performed by: Concha Se, MD Authorized by: Concha Se, MD     Interpretation: normal      ECG rate:  60   ECG rate assessment: normal     Rhythm: sinus rhythm     Ectopy: none     Conduction: normal   .Critical Care  Performed by: Concha Se, MD Authorized by: Concha Se, MD   Critical care provider statement:    Critical care time (minutes):  30   Critical care was necessary to treat or prevent imminent or life-threatening deterioration of the following conditions: anemia.   Critical care was time spent personally by me on the following activities:  Development of treatment plan with patient or surrogate, discussions with consultants, evaluation of patient's response to treatment, examination of patient, ordering and review of laboratory studies, ordering and review of radiographic studies, ordering and performing treatments and interventions, pulse oximetry, re-evaluation of patient's condition and review of old charts    MEDICATIONS ORDERED IN ED: Medications  0.9 %  sodium chloride infusion (Manually program via Guardrails IV Fluids) (0 mLs Intravenous Hold 02/14/24 1230)  octreotide (SANDOSTATIN) 2  mcg/mL load via infusion 50 mcg (50 mcg Intravenous Bolus from Bag 02/14/24 1352)    And  octreotide (SANDOSTATIN) 500 mcg in sodium chloride 0.9 % 250 mL (2 mcg/mL) infusion (50 mcg/hr Intravenous New Bag/Given 02/14/24 1351)  pantoprazole (PROTONIX) injection 40 mg (40 mg Intravenous Given 02/14/24 1341)     IMPRESSION / MDM / ASSESSMENT AND PLAN / ED COURSE  I reviewed the triage vital signs and the nursing notes.   Patient's presentation is most consistent with acute presentation with potential threat to life or bodily function.   Patient comes in with concerns for low blood levels.  I spoke this could have been from a GI bleed given she did report some black stools and is Hemoccult positive but no black stools at this time start patient on PPI, octreotide given cirrhosis history did let Dr. Glenora Laos that patient would be admitted for evaluation of endoscopy  given it looks like they are planning to do this outpatient.  I have ordered 2 units of blood to be transfused.  Patient expressed understanding felt comfortable with this plan  Low hemoglobin- Kidney function reassuring.  INR slightly elevated  The patient is on the cardiac monitor to evaluate for evidence of arrhythmia and/or significant heart rate changes.      FINAL CLINICAL IMPRESSION(S) / ED DIAGNOSES   Final diagnoses:  Gastrointestinal hemorrhage, unspecified gastrointestinal hemorrhage type  Symptomatic anemia     Rx / DC Orders   ED Discharge Orders     None        Note:  This document was prepared using Dragon voice recognition software and may include unintentional dictation errors.   Lubertha Rush, MD 02/14/24 [phone]

## 2024-02-14 NOTE — Assessment & Plan Note (Addendum)
 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 Emerick Hanlon with GI outpatient Noted concurrent acute on chronic blood loss with concern for GI etiology Octreotide drip in place IV PPI  Dr. Ole Berkeley consulted Pending bowel prep for upper and lower endoscopies

## 2024-02-14 NOTE — Progress Notes (Signed)
 Per MD; please take patient to the ED for blood transfusions. Hgb 5.1. No availability today or tomorrow in [facility]. Patient taken to ED in wheelchair. Her walker has been placed at reception in the cancer center. Pt will have her son come and pick it up. Report given to nurse in ED.

## 2024-02-14 NOTE — Assessment & Plan Note (Signed)
 Stable  Cont abilify

## 2024-02-14 NOTE — Addendum Note (Signed)
 Addended by: Vivi Grit on: 02/14/2024 01:41 PM   Modules accepted: Orders

## 2024-02-14 NOTE — Progress Notes (Signed)
 Fatigue/weakness: YES Dyspena: YES-sob with increase heart rate  Light headedness/Dizziness: YES Blood in stool: NO  Pt states she feels like heart is beating fast, weakness and dizzy since Friday. Comes and goes. She has an appt with cardiology in May at [facility]. She is having an EKG next week.  She had a CT RENAL TUMOR ABLATION UNILATERAL 11/23/23.

## 2024-02-14 NOTE — Telephone Encounter (Signed)
 Got a call from amy about the pt went to ER and her walker did not come with hre. I spoke to Nurse that took at the ER that between both the patient and Nurse they could take it all. Pt' says that son is coming to get the walker. Also amy called about it the wheel chair also I called also and I called her and got voicemail and told her the above info about the wheel chair

## 2024-02-14 NOTE — Telephone Encounter (Signed)
 notified pt - she states that she in the hospital and will her daughter in law to pick up.

## 2024-02-14 NOTE — H&P (Signed)
 History and Physical    Patient: Margaret [name] ZOX:[id number] DOB: 12-31-1955 DOA: 02/14/2024 DOS: the patient was seen and examined on 02/14/2024 PCP: Danella Penton, MD  Patient coming from: Home  Chief Complaint:  Chief Complaint  Patient presents with   abnormal labs   HPI: Margaret [name] is a 68 y.o. female with medical history significant of HFpEF, aortic stenosis, stage III CKD, type 2 diabetes, NASH cirrhosis, history of renal mass, iron deficiency anemia presenting with acute on chronic anemia, occult GI bleeding.  Patient reports generalized weakness and fatigue over the past 4 to 5 weeks.  Followed by Dr. Mia Creek outpatient for NASH cirrhosis as well as Dr. Donneta Romberg for iron deficiency anemia.  Has had worsening weakness and fatigue with exertion.  Intermittent black stools.  Baseline hemoglobin around 9-10.  Last seen with Dr. Mia Creek on April 10.  Noted stable cirrhotic disease.  Was seen by Dr. Guy Sandifer today for weakness with noted hemoglobin of 5 and redirected to the ER for further evaluation.  Noted prior history of EGD colonoscopy showing adenomatous polyps and gastric AVMs.  Patient does admit to using regular NSAID use including ibuprofen.  No reported alcohol or tobacco use.  Denies any gross bright red stools.  Using low-dose PPI on a regular basis Presented to the ER afebrile, hemodynamically stable.  Satting well on room air.  White count 5.3, hemoglobin 5.1, platelets 111.  Creatinine 1.03.  Blood sugar 200s.  Dr. Servando Snare consulted in the ER with plan for upper and lower endoscopy. Review of Systems: As mentioned in the history of present illness. All other systems reviewed and are negative. Past Medical History:  Diagnosis Date   (HFpEF) heart failure with preserved ejection fraction (HCC)    a.) TTE 12/15/2022: EF >55%, mild LVH, G1DD, mild LAE, mild MAC, triv AR/MR/TR, mild TR, RVSP 30.7, mod AS (MPG 20)   Anxiety    Aortic atherosclerosis (HCC)    Aortic stenosis     a.) TTE 12/15/2022: moderate AS (MPG 20 mmHg; AVA = 1.7 cm2)   Bilateral carotid artery disease (HCC)    a.) doppler 12/22/2020: <50% BICA   Bilateral renal cysts    Bipolar 1 disorder (HCC)    CAD (coronary artery disease) 11/04/2022   a.) CT chest 11/04/2022: 3v CAD   Cardiac murmur    CKD (chronic kidney disease), stage III (HCC)    DDD (degenerative disc disease), lumbar    Depression    DM (diabetes mellitus), type 2 (HCC)    Former smoker    Frequent falls    GERD (gastroesophageal reflux disease)    H/O: GI bleed    Heart murmur    History of blood transfusion    History of MRSA infection 2017   HLD (hyperlipidemia)    Hypertension    IDA (iron deficiency anemia)    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H [facility])    Multiple pulmonary nodules    Nephrolithiasis    Occasional tremors    Osteoarthritis    Osteoporosis    Portal hypertensive gastropathy (HCC)    Portal venous hypertension (HCC)    Renal mass, right 03/30/2023   a.) MRI ABD 03/30/2023: 1.2 x 1.2 cm complex cystic renal cortical mass in the posterior mid to low RIGHT kidney (Bosniak 3); b.) MRI ABD 08/03/2023: interval increase in size to 1.4 x 1.3 cm -> concerning for RCC; c.) [place] ABD 10/12/2023: 1.7 complex cystic mass RIGHT kidney; d.) MRI ABD 11/15/2023: interval increase in  size to 1.6 x 1.2 x 1.0 cm   Splenomegaly    Thoracic spondylosis    Thrombocytopenia (HCC)    Tubular adenoma of colon    Uses walker    Venous insufficiency    Ventral hernia    Past Surgical History:  Procedure Laterality Date   ABDOMINAL HYSTERECTOMY     CHOLECYSTECTOMY     COLONOSCOPY WITH PROPOFOL N/A 06/23/2019   Procedure: COLONOSCOPY WITH PROPOFOL;  Surgeon: Luke Salaam, MD;  Location: [facility] ENDOSCOPY;  Service: Gastroenterology;  Laterality: N/A;   COLONOSCOPY WITH PROPOFOL N/A 12/08/2022   Procedure: COLONOSCOPY WITH PROPOFOL;  Surgeon: Shane Darling, MD;  Location: [facility] ENDOSCOPY;  Service: Endoscopy;  Laterality: N/A;    COLONOSCOPY WITH PROPOFOL N/A 05/18/2023   Procedure: COLONOSCOPY WITH PROPOFOL;  Surgeon: Shane Darling, MD;  Location: [facility] ENDOSCOPY;  Service: Endoscopy;  Laterality: N/A;   ESOPHAGOGASTRODUODENOSCOPY (EGD) WITH PROPOFOL N/A 06/23/2019   Procedure: ESOPHAGOGASTRODUODENOSCOPY (EGD) WITH PROPOFOL;  Surgeon: Luke Salaam, MD;  Location: [facility] ENDOSCOPY;  Service: Gastroenterology;  Laterality: N/A;   ESOPHAGOGASTRODUODENOSCOPY (EGD) WITH PROPOFOL N/A 12/08/2022   Procedure: ESOPHAGOGASTRODUODENOSCOPY (EGD) WITH PROPOFOL;  Surgeon: Shane Darling, MD;  Location: [facility] ENDOSCOPY;  Service: Endoscopy;  Laterality: N/A;  DM, OZEMPIC   ESOPHAGOGASTRODUODENOSCOPY (EGD) WITH PROPOFOL N/A 01/12/2023   Procedure: ESOPHAGOGASTRODUODENOSCOPY (EGD) WITH PROPOFOL;  Surgeon: Shane Darling, MD;  Location: [facility] ENDOSCOPY;  Service: Endoscopy;  Laterality: N/A;  DM   IR RADIOLOGIST EVAL & MGMT  10/28/2023   IR RADIOLOGIST EVAL & MGMT  01/18/2024   POLYPECTOMY  05/18/2023   Procedure: POLYPECTOMY;  Surgeon: Shane Darling, MD;  Location: [facility] ENDOSCOPY;  Service: Endoscopy;;   TONSILLECTOMY     TOTAL HIP ARTHROPLASTY Right 2017   Social History:  reports that she quit smoking about 12 years ago. Her smoking use included cigarettes. She has never used smokeless tobacco. She reports current alcohol use. She reports that she does not currently use drugs.  Allergies  Allergen Reactions   Lithium Nausea And Vomiting and Other (See Comments)   Penicillins Hives    Resulted in hospitalization   Sulfa Antibiotics Hives    Resulted in hospitalization   Benzonatate Other (See Comments)    Family History  Problem Relation Age of Onset   Lung cancer Mother    Heart attack Father 33   Mental illness Father    Bipolar disorder Brother    Bipolar disorder Paternal Uncle    Bipolar disorder Paternal Uncle     Prior to Admission medications   Medication Sig Start Date End Date Taking?  Authorizing Provider  amLODipine (NORVASC) 5 MG tablet Take 5 mg by mouth daily.   Yes [provider]  ARIPiprazole (ABILIFY) 10 MG tablet Take 1 tablet (10 mg total) by mouth daily. 02/14/24  Yes Eappen, Saramma, MD  benztropine (COGENTIN) 0.5 MG tablet Take 1 tablet (0.5 mg total) by mouth 2 (two) times daily as needed for tremors. 01/21/24  Yes Eappen, Saramma, MD  escitalopram (LEXAPRO) 10 MG tablet TAKE ONE TABLET BY MOUTH ONE TIME DAILY Patient taking differently: Take 10 mg by mouth at bedtime. 10/08/23  Yes Eappen, Saramma, MD  furosemide (LASIX) 20 MG tablet Take 20 mg by mouth daily as needed for fluid or edema. 01/30/20  Yes [provider]  HYDROcodone-acetaminophen (NORCO) 10-325 MG tablet Take 1 tablet by mouth every 8 (eight) hours as needed for severe pain (pain score 7-10). Must last 30 days. 02/02/24 03/03/24 Yes  Edward Jolly, MD  lamoTRIgine (LAMICTAL) 200 MG tablet TAKE ONE TABLET BY MOUTH ONE TIME DAILY Patient taking differently: Take 225 mg by mouth daily. 10/08/23  Yes Jomarie Longs, MD  lamoTRIgine (LAMICTAL) 25 MG tablet Take 1 tablet (25 mg total) by mouth daily. Take daily with 200 mg , total of 225 mg 01/21/24  Yes Eappen, Levin Bacon, MD  metFORMIN (GLUCOPHAGE) 500 MG tablet Take 500 mg by mouth 2 (two) times daily with a meal.   Yes [provider]  OZEMPIC, 1 MG/DOSE, 4 MG/3ML SOPN Inject 1 mg into the skin once a week. Sundays 12/08/21  Yes [provider]  pantoprazole (PROTONIX) 40 MG tablet Take 40 mg by mouth every morning.   Yes [provider]  polyethylene glycol powder (GLYCOLAX/MIRALAX) 17 GM/SCOOP powder Take 17 g by mouth daily. 07/15/22  Yes [provider]  spironolactone (ALDACTONE) 25 MG tablet Take 25 mg by mouth daily.   Yes [provider]  tizanidine (ZANAFLEX) 2 MG capsule Take 2 mg by mouth 3 (three) times daily as needed for muscle spasms. Takes differently   Yes [provider]  XIFAXAN  550 MG TABS tablet Take 550 mg by mouth 2 (two) times daily. 10/29/23  Yes [provider]    Physical Exam: Vitals:   02/14/24 1136 02/14/24 1405 02/14/24 1420  BP: (!) 136/51 (!) 110/42 (!) 115/42  Pulse: 69 70 70  Resp: 17 14 16   Temp: 98.7 F (37.1 C) 98.4 F (36.9 C) 97.8 F (36.6 C)  TempSrc: Oral Oral Oral  SpO2: 100% 97% 99%  Weight: 81 kg    Height: 5' (1.524 m)     Physical Exam Constitutional:      Appearance: She is obese.  HENT:     Head: Normocephalic and atraumatic.     Nose: Nose normal.     Mouth/Throat:     Mouth: Mucous membranes are moist.  Eyes:     Pupils: Pupils are equal, round, and reactive to light.  Cardiovascular:     Rate and Rhythm: Normal rate and regular rhythm.  Pulmonary:     Effort: Pulmonary effort is normal.  Abdominal:     General: Bowel sounds are normal.  Musculoskeletal:        General: Normal range of motion.  Skin:    General: Skin is warm.  Neurological:     General: No focal deficit present.  Psychiatric:        Mood and Affect: Mood normal.     Data Reviewed:  There are no new results to review at this time.  Lab Results  Component Value Date   WBC 5.0 02/14/2024   HGB 5.2 (L) 02/14/2024   HCT 18.3 (L) 02/14/2024   MCV 78.2 (L) 02/14/2024   PLT 112 (L) 02/14/2024   Last metabolic panel Lab Results  Component Value Date   GLUCOSE 171 (H) 02/14/2024   NA 139 02/14/2024   K 4.2 02/14/2024   CL 106 02/14/2024   CO2 23 02/14/2024   BUN 19 02/14/2024   CREATININE 1.06 (H) 02/14/2024   GFRNONAA 58 (L) 02/14/2024   CALCIUM 9.0 02/14/2024   PROT 6.5 02/14/2024   ALBUMIN 3.5 02/14/2024   LABGLOB 2.8 01/19/2023   AGRATIO 1.5 12/06/2019   BILITOT 1.0 02/14/2024   ALKPHOS 84 02/14/2024   AST 30 02/14/2024   ALT 19 02/14/2024   ANIONGAP 10 02/14/2024     Assessment and Plan: Acute blood loss anemia Occult GI  bleeding Acute on chronic blood loss anemia with noted hemoglobin of 5.2 today with  baseline between 9 and 10 Followed by Dr. 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 Emerick Hanlon with GI outpatient Noted concurrent acute on chronic blood loss with concern for GI etiology Octreotide drip in place IV PPI  Dr. Ole Berkeley consulted Pending bowel prep for upper and lower endoscopies   Hepatic encephalopathy (HCC) Continue rifaximin  Thrombocytopenia (HCC) Plt count in 110s  Stable  Monitor   Bipolar 1 disorder, depressed, mild (HCC) Stable  Cont abilify    Controlled type 2 diabetes mellitus without complication, without long-term current use of insulin (HCC) Blood sugar 200s  SSI  A1C Monitor   CKD (chronic kidney disease), stage III (HCC) Cr 1 w/ GFR 60  Monitor   HTN (hypertension) BP stable  Titrate home regimen        Advance Care Planning:   Code Status: Prior   Consults: GI   Family Communication: No family at the bedside   Severity of Illness: The appropriate patient status for this patient is OBSERVATION. Observation status is judged to be reasonable and necessary in order to provide the required intensity of service to ensure the patient's safety. The patient's presenting symptoms, physical exam findings, and initial radiographic and laboratory data in the context of their medical condition is felt to place them at decreased risk for further clinical deterioration. Furthermore, it is anticipated that the patient will be medically stable for discharge from the hospital within 2 midnights of admission.   Author: Corrinne Din, MD 02/14/2024 4:01  PM  For on call review [email or web address].

## 2024-02-14 NOTE — Assessment & Plan Note (Signed)
 BP stable Titrate home regimen

## 2024-02-14 NOTE — Assessment & Plan Note (Signed)
 Cr 1 w/ GFR 60  Monitor

## 2024-02-14 NOTE — ED Notes (Signed)
 Pt has blood work visibile in Epic, HgB 5.6, Iron 21.

## 2024-02-14 NOTE — Telephone Encounter (Signed)
 I have sent Abilify to pharmacy as requested.

## 2024-02-14 NOTE — Progress Notes (Signed)
 NO Venofer today

## 2024-02-14 NOTE — Assessment & Plan Note (Signed)
 Plt count in 110s  Stable  Monitor

## 2024-02-14 NOTE — Progress Notes (Signed)
 [facility] CONSULT NOTE  Patient Care Team: Sari Cunning, MD as PCP - General (Internal Medicine) Gwyn Leos, MD as Consulting Physician (Internal Medicine) Shane Darling, MD as Consulting Physician (Gastroenterology)  CHIEF COMPLAINTS/PURPOSE OF CONSULTATION: Anemia/thrombocytopenia   HEMATOLOGY HISTORY  06/21/2019-06/24/2019 due to severe anemia; hemoglobin of 5.7 upon admission; patient received 2 units of blood transfusion, 3 doses of IV iron. EGD and colonoscopy were performed during the hospitalization EGD and colonoscopy on 06/23/2019. [Dr.Anna; [facility]] EGD showed no abnormalities and the colonoscopy showed 3 sessile diminutive polyps which were resected completely.  Arteries of the duodenum were normal.  3 colon polyps resected were serrated polyp x2 and a tubular adenoma x1. No active source of bleeding was noted.    # Cirrhosis NAFLD- [? [facility]]  HISTORY OF PRESENTING ILLNESS: ambulating with rolling walker [arthritis].   Margaret [name] 68 y.o.  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   Patient is dizzy. Pt feels tired. Pt states she feels like heart is beating fast, weakness and dizzy since Friday. Comes and goes. Shortness of breath with exertion.   Complains of ongoing fatigue.  Denies any blood in stools or black or stools.  No abdominal pain, nausea, or vomiting.    Review of Systems  Constitutional:  Positive for malaise/fatigue. Negative for chills, diaphoresis, fever and weight loss.  HENT:  Negative for nosebleeds and sore throat.   Eyes:  Negative for double vision.  Respiratory:  Positive for shortness of breath. Negative for cough, hemoptysis, sputum production and wheezing.   Cardiovascular:  Positive for palpitations. Negative for chest pain, orthopnea and leg swelling.   Gastrointestinal:  Negative for abdominal pain, blood in stool, constipation, diarrhea, heartburn, melena, nausea and vomiting.  Genitourinary:  Negative for dysuria, frequency and urgency.  Musculoskeletal:  Positive for back pain and joint pain.  Skin: Negative.  Negative for itching and rash.  Neurological:  Negative for dizziness, tingling, focal weakness, weakness and headaches.  Endo/Heme/Allergies:  Does not bruise/bleed easily.  Psychiatric/Behavioral:  Negative for depression. The patient is not nervous/anxious and does not have insomnia.    MEDICAL HISTORY:  Past Medical History:  Diagnosis Date   (HFpEF) heart failure with preserved ejection fraction (HCC)    a.) TTE 12/15/2022: EF >55%, mild LVH, G1DD, mild LAE, mild MAC, triv AR/MR/TR, mild TR, RVSP 30.7, mod AS (MPG 20)   Anxiety    Aortic atherosclerosis (HCC)    Aortic stenosis    a.) TTE 12/15/2022: moderate AS (MPG 20 mmHg; AVA = 1.7 cm2)   Bilateral carotid artery disease (HCC)    a.) doppler 12/22/2020: <50% BICA   Bilateral renal cysts    Bipolar 1 disorder (HCC)    CAD (coronary artery disease) 11/04/2022   a.) CT chest 11/04/2022: 3v CAD   Cardiac murmur    CKD (chronic kidney disease), stage III (HCC)    DDD (degenerative disc disease), lumbar    Depression    DM (diabetes mellitus), type 2 (HCC)    Former smoker    Frequent falls    GERD (gastroesophageal reflux disease)    H/O: GI bleed    Heart murmur    History of blood transfusion    History of MRSA infection 2017   HLD (hyperlipidemia)    Hypertension    IDA (iron deficiency anemia)    Liver cirrhosis  secondary to NASH [facility])    Multiple pulmonary nodules    Nephrolithiasis    Occasional tremors    Osteoarthritis    Osteoporosis    Portal hypertensive gastropathy (HCC)    Portal venous hypertension (HCC)    Renal mass, right 03/30/2023   a.) MRI ABD 03/30/2023: 1.2 x 1.2 cm complex cystic renal cortical mass in the posterior mid to low  RIGHT kidney (Bosniak 3); b.) MRI ABD 08/03/2023: interval increase in size to 1.4 x 1.3 cm -> concerning for RCC; c.) [place] ABD 10/12/2023: 1.7 complex cystic mass RIGHT kidney; d.) MRI ABD 11/15/2023: interval increase in size to 1.6 x 1.2 x 1.0 cm   Splenomegaly    Thoracic spondylosis    Thrombocytopenia (HCC)    Tubular adenoma of colon    Uses walker    Venous insufficiency    Ventral hernia    SURGICAL HISTORY: Past Surgical History:  Procedure Laterality Date   ABDOMINAL HYSTERECTOMY     CHOLECYSTECTOMY     COLONOSCOPY WITH PROPOFOL N/A 06/23/2019   Procedure: COLONOSCOPY WITH PROPOFOL;  Surgeon: Wyline Mood, MD;  Location: [facility] ENDOSCOPY;  Service: Gastroenterology;  Laterality: N/A;   COLONOSCOPY WITH PROPOFOL N/A 12/08/2022   Procedure: COLONOSCOPY WITH PROPOFOL;  Surgeon: Regis Bill, MD;  Location: [facility] ENDOSCOPY;  Service: Endoscopy;  Laterality: N/A;   COLONOSCOPY WITH PROPOFOL N/A 05/18/2023   Procedure: COLONOSCOPY WITH PROPOFOL;  Surgeon: Regis Bill, MD;  Location: [facility] ENDOSCOPY;  Service: Endoscopy;  Laterality: N/A;   ESOPHAGOGASTRODUODENOSCOPY (EGD) WITH PROPOFOL N/A 06/23/2019   Procedure: ESOPHAGOGASTRODUODENOSCOPY (EGD) WITH PROPOFOL;  Surgeon: Wyline Mood, MD;  Location: [facility] ENDOSCOPY;  Service: Gastroenterology;  Laterality: N/A;   ESOPHAGOGASTRODUODENOSCOPY (EGD) WITH PROPOFOL N/A 12/08/2022   Procedure: ESOPHAGOGASTRODUODENOSCOPY (EGD) WITH PROPOFOL;  Surgeon: Regis Bill, MD;  Location: [facility] ENDOSCOPY;  Service: Endoscopy;  Laterality: N/A;  DM, OZEMPIC   ESOPHAGOGASTRODUODENOSCOPY (EGD) WITH PROPOFOL N/A 01/12/2023   Procedure: ESOPHAGOGASTRODUODENOSCOPY (EGD) WITH PROPOFOL;  Surgeon: Regis Bill, MD;  Location: [facility] ENDOSCOPY;  Service: Endoscopy;  Laterality: N/A;  DM   IR RADIOLOGIST EVAL & MGMT  10/28/2023   IR RADIOLOGIST EVAL & MGMT  01/18/2024   POLYPECTOMY  05/18/2023   Procedure: POLYPECTOMY;  Surgeon: Regis Bill, MD;  Location: [facility] ENDOSCOPY;  Service: Endoscopy;;   TONSILLECTOMY     TOTAL HIP ARTHROPLASTY Right 2017   SOCIAL HISTORY: Social History   Socioeconomic History   Marital status: Divorced    Spouse name: Not on file   Number of children: 2   Years of education: Not on file   Highest education level: Not on file  Occupational History   Not on file  Tobacco Use   Smoking status: Former    Current packs/day: 0.00    Types: Cigarettes    Quit date: 03/03/2011    Years since quitting: 12.9   Smokeless tobacco: Never  Vaping Use   Vaping status: Never Used  Substance and Sexual Activity   Alcohol use: Yes    Comment: rarely   Drug use: Not Currently   Sexual activity: Not Currently  Other Topics Concern   Not on file  Social History Narrative   Not on file   Social Drivers of Health   Financial Resource Strain: Low Risk  (06/29/2023)   Received from [facility]   Overall Financial Resource Strain (CARDIA)    Difficulty of Paying Living Expenses: Not hard at all  Food Insecurity: No  Food Insecurity (02/14/2024)   Hunger Vital Sign    Worried About Running Out of Food in the Last Year: Never true    Ran Out of Food in the Last Year: Never true  Transportation Needs: No Transportation Needs (02/14/2024)   PRAPARE - Administrator, Civil Service (Medical): No    Lack of Transportation (Non-Medical): No  Physical Activity: Inactive (06/16/2021)   Received from [facility]   Exercise Vital Sign  Stress: No Stress Concern Present (06/16/2021)   Received from [facility] of Occupational Health - Occupational Stress Questionnaire  Social Connections: Moderately Integrated (02/14/2024)   Social Connection and Isolation Panel [NHANES]    Frequency of Communication with Friends and Family: More than three times a week    Frequency of Social Gatherings with Friends and Family: More than  three times a week    Attends Religious Services: More than 4 times per year    Active Member of [employer] or Organizations: Yes    Attends Engineer, structural: More than 4 times per year    Marital Status: Divorced  Intimate Partner Violence: Not At Risk (02/14/2024)   Humiliation, Afraid, Rape, and Kick questionnaire    Fear of Current or Ex-Partner: No    Emotionally Abused: No    Physically Abused: No    Sexually Abused: No   FAMILY HISTORY: Family History  Problem Relation Age of Onset   Lung cancer Mother    Heart attack Father 57   Mental illness Father    Bipolar disorder Brother    Bipolar disorder Paternal Uncle    Bipolar disorder Paternal Uncle    ALLERGIES:  is allergic to lithium, penicillins, sulfa antibiotics, and benzonatate.  MEDICATIONS:  No current facility-administered medications for this visit.   Current Outpatient Medications  Medication Sig Dispense Refill   amLODipine (NORVASC) 2.5 MG tablet Take 2.5 mg by mouth every morning.     ARIPiprazole (ABILIFY) 10 MG tablet TAKE ONE TABLET BY MOUTH ONE TIME DAILY (Patient taking differently: Take 10 mg by mouth at bedtime.) 90 tablet 1   benztropine (COGENTIN) 0.5 MG tablet Take 1 tablet (0.5 mg total) by mouth 2 (two) times daily as needed for tremors. 60 tablet 1   escitalopram (LEXAPRO) 10 MG tablet TAKE ONE TABLET BY MOUTH ONE TIME DAILY (Patient taking differently: Take 10 mg by mouth at bedtime.) 90 tablet 3   furosemide (LASIX) 20 MG tablet Take 10 mg by mouth daily as needed.     HYDROcodone-acetaminophen (NORCO) 10-325 MG tablet Take 1 tablet by mouth every 8 (eight) hours as needed for severe pain (pain score 7-10). Must last 30 days. 90 tablet 0   lamoTRIgine (LAMICTAL) 200 MG tablet TAKE ONE TABLET BY MOUTH ONE TIME DAILY (Patient taking differently: Take 225 mg by mouth daily.) 90 tablet 3   lamoTRIgine (LAMICTAL) 25 MG tablet Take 1 tablet (25 mg total) by mouth daily. Take daily with 200 mg ,  total of 225 mg 90 tablet 0   metFORMIN (GLUCOPHAGE) 500 MG tablet Take 500 mg by mouth 2 (two) times daily with a meal.     OZEMPIC, 1 MG/DOSE, 4 MG/3ML SOPN Inject 1 mg into the skin once a week. Sundays     pantoprazole (PROTONIX) 40 MG tablet Take 40 mg by mouth every morning.     polyethylene glycol powder (GLYCOLAX/MIRALAX) 17 GM/SCOOP powder Take 1 Container by mouth daily.  spironolactone (ALDACTONE) 25 MG tablet Take 25 mg by mouth every morning.     tizanidine (ZANAFLEX) 2 MG capsule Take 2 mg by mouth 3 (three) times daily as needed for muscle spasms. Takes differently     XIFAXAN 550 MG TABS tablet Take 550 mg by mouth 2 (two) times daily.     Facility-Administered Medications Ordered in Other Visits  Medication Dose Route Frequency Provider Last Rate Last Admin   0.9 %  sodium chloride infusion (Manually program via Guardrails IV Fluids)   Intravenous Once Concha Se, MD   Held at 02/14/24 1230   octreotide (SANDOSTATIN) 2 mcg/mL load via infusion 50 mcg  50 mcg Intravenous Once Concha Se, MD       And   octreotide (SANDOSTATIN) 500 mcg in sodium chloride 0.9 % 250 mL (2 mcg/mL) infusion  50 mcg/hr Intravenous Continuous Concha Se, MD       pantoprazole (PROTONIX) injection 40 mg  40 mg Intravenous Once Concha Se, MD         PHYSICAL EXAMINATION: Vitals:   02/14/24 1002  BP: (!) 120/38  Pulse: 65  Resp: 16  Temp: 98.3 F (36.8 C)  SpO2: 100%    Filed Weights   02/14/24 1002  Weight: 180 lb 11.2 oz (82 kg)    Physical Exam Vitals reviewed.  Constitutional:      Appearance: She is not ill-appearing.  Eyes:     General: No scleral icterus. Cardiovascular:     Rate and Rhythm: Normal rate and regular rhythm.  Pulmonary:     Effort: No respiratory distress.  Abdominal:     General: There is no distension.     Palpations: Abdomen is soft.     Tenderness: There is no abdominal tenderness. There is no guarding.  Skin:    General: Skin is warm  and dry.  Neurological:     Mental Status: She is alert and oriented to person, place, and time.  Psychiatric:        Mood and Affect: Mood normal.        Behavior: Behavior normal.      LABORATORY DATA:  I have reviewed the data as listed Lab Results  Component Value Date   WBC 5.0 02/14/2024   HGB 5.2 (L) 02/14/2024   HCT 18.3 (L) 02/14/2024   MCV 78.2 (L) 02/14/2024   PLT 112 (L) 02/14/2024   Recent Labs    10/11/23 0926 11/22/23 0800 01/05/24 1038 02/14/24 0955 02/14/24 1153  NA 137   < > 133* 137 139  K 4.7   < > 4.5 4.6 4.2  CL 103   < > 100 104 106  CO2 24   < > 24 22 23   GLUCOSE 161*   < > 225* 200* 171*  BUN 21   < > 16 18 19   CREATININE 1.06*   < > 1.04* 1.03* 1.06*  CALCIUM 9.3   < > 8.8* 8.9 9.0  GFRNONAA 58*   < > 59* 60* 58*  PROT 6.7  --  6.8  --  6.5  ALBUMIN 3.8  --  3.7  --  3.5  AST 25  --  29  --  30  ALT 20  --  20  --  19  ALKPHOS 70  --  101  --  84  BILITOT 1.0  --  0.9  --  1.0   < > = values in this interval not displayed.  IR Radiologist Eval & Mgmt Result Date: 01/18/2024 EXAM: NEW PATIENT OFFICE VISIT CHIEF COMPLAINT: SEE NOTE IN EPIC HISTORY OF PRESENT ILLNESS: SEE NOTE IN EPIC REVIEW OF SYSTEMS: SEE NOTE IN EPIC PHYSICAL EXAMINATION: SEE NOTE IN EPIC ASSESSMENT AND PLAN: SEE NOTE IN EPIC Electronically Signed   By: Fernando Hoyer M.D.   On: 01/18/2024 15:10    ASSESSMENT & PLAN:    Symptomatic anemia # [2020] History of iron deficiency anemia secondary to chronic GI bleed/cirrhosis; GAVE [EGD in G+FEB 2024].  FEB 2024- Iron sat 9 Ferritin 45- wnl-PCP; Hb 9-10.  s/p IV iron x4 [JAN 2024]- MM panel-WNL.   # Today Hb 5.1 -extremely symptomatic with dizziness and symptomatic shortness of breath.  Recommend urgent evaluation with the emergency room.  Discussed with ER physician.  # Etiology: Cirrhosis/GI bleed FEB 2024- GAVE -non-bleeding-; colo-? Capsule study.  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   Hold off EGD for now-.  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 Stable.   #Liver cirrhosis, splenomegaly, small volume ascites.  Patient does not drink alcohol.? NAFLD-AFP. Patient will need screening ultrasounds [? [facility]]-US - 2024- March - cirrhosis;MAY 2024-MRI per GI - NO malignancy noted. Defer to  re: surveillance.   # Right kidney complex cyst [incidental MAY 2024- MRI]- s/p urology, Dr.Stoioff- MRi pending.    # weight loss sec to oezmpic [40 pounds]- stable. Low concerns for malignancy at this time.   # DISPOSITION: # ER today # follow up in 1 week- APP- labs- cbc/HOLD tube;Venofer- possible D-2 1 unit PRBC-Dr.B    All questions were answered. The patient knows to call the clinic with any problems, questions or concerns.   Gwyn Leos, MD 02/14/2024

## 2024-02-14 NOTE — Assessment & Plan Note (Signed)
 Blood sugar 200s  SSI  A1C  Monitor

## 2024-02-14 NOTE — Assessment & Plan Note (Addendum)
 Occult GI bleeding Acute on chronic blood loss anemia with noted hemoglobin of 5.2 today with baseline between 9 and 10 Followed by Dr. Fannie Homestead outpatient for iron deficiency Some concern for GI etiology in the setting of cirrhotic disease as well as black tarry stools over multiple weeks Pending bowel prep for upper and lower endoscopy Pending PRBC transfusion Continue to trend hemoglobin Transfuse if hemoglobin is 7 Anemia panel Monitor

## 2024-02-14 NOTE — ED Triage Notes (Signed)
 Pt states she was sent from [facility] for iron, Pt states she was there for an iron infusion but also had other blood work done.

## 2024-02-14 NOTE — Consult Note (Signed)
 Margaret Minium, Margaret [name] [facility]  [address], [address] [place], [place] [address] Phone: [phone] Fax : [phone]  Consultation  Referring Provider:     Dr. Alfred Levins Primary Care Physician:  Danella Penton, Margaret [name] Primary Gastroenterologist:  Dr. Mia Creek         Reason for Consultation:     GI bleed  Date of Admission:  02/14/2024 Date of Consultation:  02/14/2024         HPI:   Margaret [name] is a 68 y.o. female who reports that she was seen by Dr. Mart Piggs NP yesterday and was recommended to have an EGD and colonoscopy.  The patient states that that is not for the next few months.  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  The patient's colonoscopy has had polyps removed back in February 2024 and a repeat in July 2024. At the patient's office visit yesterday the patient was found to have a MELD score of 10 with a history of nonalcoholic fatty liver disease.  The patient has also been followed by hematology for iron deficiency anemia.  At that appointment she had reported a couple of black tarry stools 2 weeks prior but nothing since then.  She was taking her pantoprazole daily and Xifaxan twice a day.  Her recent labs were reported to show a iron saturation of 6 with a ferritin of 11 and a hemoglobin of 8.4 with platelets of 116.  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  Repeat iron studies today showed the iron to be 21 with a saturation of 4. The patient's most recent labs showed:  Component     Latest Ref Rng 11/22/2023 01/05/2024 02/14/2024  Hemoglobin     12.0 - 15.0 g/dL 13.0 (L)  8.4 (L)  5.2 (L)   Hemoglobin        5.1 (LL)   HCT     36.0 - 46.0 % 32.1 (L)  26.8 (L)  18.3 (L)   HCT        18.1 (L)    The patient's INR was prolonged at 1.3.  Past Medical History:  Diagnosis Date   (HFpEF) heart failure with preserved ejection fraction (HCC)    a.) TTE 12/15/2022: EF >55%, mild LVH, G1DD,  mild LAE, mild MAC, triv AR/MR/TR, mild TR, RVSP 30.7, mod AS (MPG 20)   Anxiety    Aortic atherosclerosis (HCC)    Aortic stenosis    a.) TTE 12/15/2022: moderate AS (MPG 20 mmHg; AVA = 1.7 cm2)   Bilateral carotid artery disease (HCC)    a.) doppler 12/22/2020: <50% BICA   Bilateral renal cysts    Bipolar 1 disorder (HCC)    CAD (coronary artery disease) 11/04/2022   a.) CT chest 11/04/2022: 3v CAD   Cardiac murmur    CKD (chronic kidney disease), stage III (HCC)    DDD (degenerative disc disease), lumbar    Depression    DM (diabetes mellitus), type 2 (HCC)    Former smoker    Frequent falls    GERD (gastroesophageal reflux disease)    H/O: GI bleed    Heart murmur    History of blood transfusion    History of MRSA infection 2017   HLD (hyperlipidemia)    Hypertension    IDA (iron deficiency anemia)    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H (HCC)    Multiple pulmonary nodules  Nephrolithiasis    Occasional tremors    Osteoarthritis    Osteoporosis    Portal hypertensive gastropathy (HCC)    Portal venous hypertension (HCC)    Renal mass, right 03/30/2023   a.) MRI ABD 03/30/2023: 1.2 x 1.2 cm complex cystic renal cortical mass in the posterior mid to low RIGHT kidney (Bosniak 3); b.) MRI ABD 08/03/2023: interval increase in size to 1.4 x 1.3 cm -> concerning for RCC; c.) [place] ABD 10/12/2023: 1.7 complex cystic mass RIGHT kidney; d.) MRI ABD 11/15/2023: interval increase in size to 1.6 x 1.2 x 1.0 cm   Splenomegaly    Thoracic spondylosis    Thrombocytopenia (HCC)    Tubular adenoma of colon    Uses walker    Venous insufficiency    Ventral hernia     Past Surgical History:  Procedure Laterality Date   ABDOMINAL HYSTERECTOMY     CHOLECYSTECTOMY     COLONOSCOPY WITH PROPOFOL N/A 06/23/2019   Procedure: COLONOSCOPY WITH PROPOFOL;  Surgeon: Wyline Mood, Margaret [name];  Location: [facility] ENDOSCOPY;  Service: Gastroenterology;  Laterality: N/A;   COLONOSCOPY WITH PROPOFOL N/A 12/08/2022    Procedure: COLONOSCOPY WITH PROPOFOL;  Surgeon: Regis Bill, Margaret [name];  Location: [facility] ENDOSCOPY;  Service: Endoscopy;  Laterality: N/A;   COLONOSCOPY WITH PROPOFOL N/A 05/18/2023   Procedure: COLONOSCOPY WITH PROPOFOL;  Surgeon: Regis Bill, Margaret [name];  Location: [facility] ENDOSCOPY;  Service: Endoscopy;  Laterality: N/A;   ESOPHAGOGASTRODUODENOSCOPY (EGD) WITH PROPOFOL N/A 06/23/2019   Procedure: ESOPHAGOGASTRODUODENOSCOPY (EGD) WITH PROPOFOL;  Surgeon: Wyline Mood, Margaret [name];  Location: [facility] ENDOSCOPY;  Service: Gastroenterology;  Laterality: N/A;   ESOPHAGOGASTRODUODENOSCOPY (EGD) WITH PROPOFOL N/A 12/08/2022   Procedure: ESOPHAGOGASTRODUODENOSCOPY (EGD) WITH PROPOFOL;  Surgeon: Regis Bill, Margaret [name];  Location: [facility] ENDOSCOPY;  Service: Endoscopy;  Laterality: N/A;  DM, OZEMPIC   ESOPHAGOGASTRODUODENOSCOPY (EGD) WITH PROPOFOL N/A 01/12/2023   Procedure: ESOPHAGOGASTRODUODENOSCOPY (EGD) WITH PROPOFOL;  Surgeon: Regis Bill, Margaret [name];  Location: [facility] ENDOSCOPY;  Service: Endoscopy;  Laterality: N/A;  DM   IR RADIOLOGIST EVAL & MGMT  10/28/2023   IR RADIOLOGIST EVAL & MGMT  01/18/2024   POLYPECTOMY  05/18/2023   Procedure: POLYPECTOMY;  Surgeon: Regis Bill, Margaret [name];  Location: [facility] ENDOSCOPY;  Service: Endoscopy;;   TONSILLECTOMY     TOTAL HIP ARTHROPLASTY Right 2017    Prior to Admission medications   Medication Sig Start Date End Date Taking? Authorizing Provider  amLODipine (NORVASC) 2.5 MG tablet Take 2.5 mg by mouth every morning.    Provider, Historical, Margaret [name]  ARIPiprazole (ABILIFY) 10 MG tablet TAKE ONE TABLET BY MOUTH ONE TIME DAILY Patient taking differently: Take 10 mg by mouth at bedtime. 08/25/23   Jomarie Longs, Margaret [name]  benztropine (COGENTIN) 0.5 MG tablet Take 1 tablet (0.5 mg total) by mouth 2 (two) times daily as needed for tremors. 01/21/24   Jomarie Longs, Margaret [name]  escitalopram (LEXAPRO) 10 MG tablet TAKE ONE TABLET BY MOUTH ONE TIME DAILY Patient taking differently: Take 10 mg  by mouth at bedtime. 10/08/23   Jomarie Longs, Margaret [name]  furosemide (LASIX) 20 MG tablet Take 10 mg by mouth daily as needed. 01/30/20   Provider, Historical, Margaret [name]  HYDROcodone-acetaminophen (NORCO) 10-325 MG tablet Take 1 tablet by mouth every 8 (eight) hours as needed for severe pain (pain score 7-10). Must last 30 days. 02/02/24 03/03/24  Edward Jolly, Margaret [name]  lamoTRIgine (LAMICTAL) 200 MG tablet TAKE ONE TABLET BY MOUTH ONE TIME DAILY Patient taking differently: Take 225 mg by mouth daily. 10/08/23  Eappen, Saramma, Margaret [name]  lamoTRIgine (LAMICTAL) 25 MG tablet Take 1 tablet (25 mg total) by mouth daily. Take daily with 200 mg , total of 225 mg 01/21/24   Eappen, Saramma, Margaret [name]  metFORMIN (GLUCOPHAGE) 500 MG tablet Take 500 mg by mouth 2 (two) times daily with a meal.    Provider, Historical, Margaret [name]  OZEMPIC, 1 MG/DOSE, 4 MG/3ML SOPN Inject 1 mg into the skin once a week. Sundays 12/08/21   Provider, Historical, Margaret [name]  pantoprazole (PROTONIX) 40 MG tablet Take 40 mg by mouth every morning.    Provider, Historical, Margaret [name]  polyethylene glycol powder (GLYCOLAX/MIRALAX) 17 GM/SCOOP powder Take 1 Container by mouth daily. 07/15/22   Provider, Historical, Margaret [name]  spironolactone (ALDACTONE) 25 MG tablet Take 25 mg by mouth every morning.    Provider, Historical, Margaret [name]  tizanidine (ZANAFLEX) 2 MG capsule Take 2 mg by mouth 3 (three) times daily as needed for muscle spasms. Takes differently    Provider, Historical, Margaret [name]  XIFAXAN 550 MG TABS tablet Take 550 mg by mouth 2 (two) times daily. 10/29/23   Provider, Historical, Margaret [name]    Family History  Problem Relation Age of Onset   Lung cancer Mother    Heart attack Father 32   Mental illness Father    Bipolar disorder Brother    Bipolar disorder Paternal Uncle    Bipolar disorder Paternal Uncle      Social History   Tobacco Use   Smoking status: Former    Current packs/day: 0.00    Types: Cigarettes    Quit date: 03/03/2011    Years since quitting: 12.9   Smokeless tobacco: Never  Vaping Use    Vaping status: Never Used  Substance Use Topics   Alcohol use: Yes    Comment: rarely   Drug use: Not Currently    Allergies as of 02/14/2024 - Review Complete 02/14/2024  Allergen Reaction Noted   Lithium Nausea And Vomiting and Other (See Comments) 06/27/2020   Penicillins Hives 12/03/2018   Sulfa antibiotics Hives 12/03/2018   Benzonatate Other (See Comments) 10/13/2022    Review of Systems:    All systems reviewed and negative except where noted in HPI.   Physical Exam:  Vital signs in last 24 hours: Temp:  [97.8 F (36.6 C)-98.7 F (37.1 C)] 97.8 F (36.6 C) (04/14 1415) Pulse Rate:  [65-70] 70 (04/14 1415) Resp:  [14-17] 16 (04/14 1415) BP: (110-136)/(38-51) 115/42 (04/14 1415) SpO2:  [97 %-100 %] 99 % (04/14 1415) Weight:  [81 kg-82 kg] 81 kg (04/14 1136)   General:   Pleasant, cooperative in NAD Head:  Normocephalic and atraumatic. Eyes:   No icterus.   Conjunctiva pink. PERRLA. Ears:  Normal auditory acuity. Neck:  Supple; no masses or thyroidomegaly Lungs: Respirations even and unlabored. Lungs clear to auscultation bilaterally.   No wheezes, crackles, or rhonchi.  Heart:  Regular rate and rhythm;  Without murmur, clicks, rubs or gallops Abdomen:  Soft, nondistended, nontender. Normal bowel sounds. No appreciable masses or hepatomegaly.  No rebound or guarding.  Rectal:  Not performed. Msk:  Symmetrical without gross deformities.    Extremities:  Without edema, cyanosis or clubbing. Neurologic:  Alert and oriented x3;  grossly normal neurologically. Skin:  Intact without significant lesions or rashes. Cervical Nodes:  No significant cervical adenopathy. Psych:  Alert and cooperative. Normal affect.  LAB RESULTS: Recent Labs    02/14/24 0955 02/14/24 1153  WBC 5.3 5.0  HGB 5.1* 5.2*  HCT 18.1* 18.3*  PLT  111* 112*   BMET Recent Labs    02/14/24 0955 02/14/24 1153  NA 137 139  K 4.6 4.2  CL 104 106  CO2 22 23  GLUCOSE 200* 171*  BUN 18 19   CREATININE 1.03* 1.06*  CALCIUM 8.9 9.0   LFT Recent Labs    02/14/24 1153  PROT 6.5  ALBUMIN 3.5  AST 30  ALT 19  ALKPHOS 84  BILITOT 1.0   PT/INR Recent Labs    02/14/24 1153  LABPROT 16.6*  INR 1.3*    STUDIES: No results found.    Impression / Plan:   Assessment: Principal Problem:   Anemia   Lenora Gomes is a 68 y.o. 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  The patient is being followed by Dr. Emerick Hanlon and is up-to-date on her [facility] surveillance.  From the nurse practitioner's note yesterday the recommendations were:  "IDA exacerabtion- continue hemonc care, schedule egd/colonoscopy- has history of adenomatous polyps and gastric avms. "   Plan:  Due to the patient's ongoing anemia the patient will be set up for an EGD and colonoscopy for tomorrow.  The patient has been told that if these are negative she may need to undergo capsule endoscopy.  The patient has been explained the plan and agrees with it.   Thank you for involving me in the care of this patient.      LOS: 0 days   [name], MD, Margaret [name]. Sylvan Evener 02/14/2024, 2:23 PM,  Pager [phone] 7am-5pm  Check AMION for 5pm -7am coverage and on weekends   Note: This dictation was prepared with Dragon dictation along with smaller phrase technology. Any transcriptional errors that result from this process are unintentional.

## 2024-02-15 ENCOUNTER — Inpatient Hospital Stay: Admitting: Certified Registered"

## 2024-02-15 ENCOUNTER — Encounter: Payer: Self-pay | Admitting: Family Medicine

## 2024-02-15 ENCOUNTER — Encounter
Admission: EM | Disposition: A | Discharge status: Home / Self Care | Payer: Self-pay | Source: Home / Self Care | Attending: Internal Medicine

## 2024-02-15 DIAGNOSIS — N1831 Chronic kidney disease, stage 3a: Secondary | ICD-10-CM | POA: Diagnosis present

## 2024-02-15 DIAGNOSIS — K635 Polyp of colon: Secondary | ICD-10-CM

## 2024-02-15 DIAGNOSIS — K641 Second degree hemorrhoids: Secondary | ICD-10-CM

## 2024-02-15 DIAGNOSIS — E785 Hyperlipidemia, unspecified: Secondary | ICD-10-CM | POA: Diagnosis present

## 2024-02-15 DIAGNOSIS — K5520 Angiodysplasia of colon without hemorrhage: Secondary | ICD-10-CM | POA: Diagnosis not present

## 2024-02-15 DIAGNOSIS — K31811 Angiodysplasia of stomach and duodenum with bleeding: Secondary | ICD-10-CM | POA: Diagnosis present

## 2024-02-15 DIAGNOSIS — I10 Essential (primary) hypertension: Secondary | ICD-10-CM

## 2024-02-15 DIAGNOSIS — K5521 Angiodysplasia of colon with hemorrhage: Secondary | ICD-10-CM | POA: Diagnosis present

## 2024-02-15 DIAGNOSIS — D62 Acute posthemorrhagic anemia: Secondary | ICD-10-CM

## 2024-02-15 DIAGNOSIS — E1122 Type 2 diabetes mellitus with diabetic chronic kidney disease: Secondary | ICD-10-CM | POA: Diagnosis present

## 2024-02-15 DIAGNOSIS — K7682 Hepatic encephalopathy: Secondary | ICD-10-CM | POA: Diagnosis present

## 2024-02-15 DIAGNOSIS — Z87891 Personal history of nicotine dependence: Secondary | ICD-10-CM | POA: Diagnosis not present

## 2024-02-15 DIAGNOSIS — K922 Gastrointestinal hemorrhage, unspecified: Secondary | ICD-10-CM

## 2024-02-15 DIAGNOSIS — I5032 Chronic diastolic (congestive) heart failure: Secondary | ICD-10-CM | POA: Diagnosis present

## 2024-02-15 DIAGNOSIS — D649 Anemia, unspecified: Secondary | ICD-10-CM | POA: Diagnosis present

## 2024-02-15 DIAGNOSIS — K209 Esophagitis, unspecified without bleeding: Secondary | ICD-10-CM | POA: Diagnosis not present

## 2024-02-15 DIAGNOSIS — E66812 Obesity, class 2: Secondary | ICD-10-CM | POA: Diagnosis present

## 2024-02-15 DIAGNOSIS — Z8249 Family history of ischemic heart disease and other diseases of the circulatory system: Secondary | ICD-10-CM | POA: Diagnosis not present

## 2024-02-15 DIAGNOSIS — D124 Benign neoplasm of descending colon: Secondary | ICD-10-CM | POA: Diagnosis not present

## 2024-02-15 DIAGNOSIS — G2581 Restless legs syndrome: Secondary | ICD-10-CM | POA: Diagnosis present

## 2024-02-15 DIAGNOSIS — I700 Atherosclerosis of aorta: Secondary | ICD-10-CM | POA: Diagnosis present

## 2024-02-15 DIAGNOSIS — K3189 Other diseases of stomach and duodenum: Secondary | ICD-10-CM | POA: Diagnosis not present

## 2024-02-15 DIAGNOSIS — Z880 Allergy status to penicillin: Secondary | ICD-10-CM | POA: Diagnosis not present

## 2024-02-15 DIAGNOSIS — K7460 Unspecified cirrhosis of liver: Secondary | ICD-10-CM | POA: Diagnosis present

## 2024-02-15 DIAGNOSIS — K766 Portal hypertension: Secondary | ICD-10-CM | POA: Diagnosis present

## 2024-02-15 DIAGNOSIS — Z96641 Presence of right artificial hip joint: Secondary | ICD-10-CM | POA: Diagnosis present

## 2024-02-15 DIAGNOSIS — K7581 Nonalcoholic steatohepatitis (NASH): Secondary | ICD-10-CM | POA: Diagnosis present

## 2024-02-15 DIAGNOSIS — Z7984 Long term (current) use of oral hypoglycemic drugs: Secondary | ICD-10-CM | POA: Diagnosis not present

## 2024-02-15 DIAGNOSIS — F3131 Bipolar disorder, current episode depressed, mild: Secondary | ICD-10-CM | POA: Diagnosis present

## 2024-02-15 DIAGNOSIS — I350 Nonrheumatic aortic (valve) stenosis: Secondary | ICD-10-CM | POA: Diagnosis present

## 2024-02-15 DIAGNOSIS — I2510 Atherosclerotic heart disease of native coronary artery without angina pectoris: Secondary | ICD-10-CM | POA: Diagnosis present

## 2024-02-15 DIAGNOSIS — I130 Hypertensive heart and chronic kidney disease with heart failure and stage 1 through stage 4 chronic kidney disease, or unspecified chronic kidney disease: Secondary | ICD-10-CM | POA: Diagnosis present

## 2024-02-15 DIAGNOSIS — D696 Thrombocytopenia, unspecified: Secondary | ICD-10-CM | POA: Diagnosis present

## 2024-02-15 HISTORY — PX: ESOPHAGOGASTRODUODENOSCOPY: SHX5428

## 2024-02-15 HISTORY — PX: GIVENS CAPSULE STUDY: SHX5432

## 2024-02-15 HISTORY — PX: HOT HEMOSTASIS: SHX5433

## 2024-02-15 HISTORY — PX: POLYPECTOMY: SHX149

## 2024-02-15 HISTORY — PX: COLONOSCOPY: SHX5424

## 2024-02-15 LAB — CBC
HCT: 28.1 % — ABNORMAL LOW (ref 36.0–46.0)
Hemoglobin: 8.8 g/dL — ABNORMAL LOW (ref 12.0–15.0)
MCH: 25 pg — ABNORMAL LOW (ref 26.0–34.0)
MCHC: 31.3 g/dL (ref 30.0–36.0)
MCV: 79.8 fL — ABNORMAL LOW (ref 80.0–100.0)
Platelets: 106 10*3/uL — ABNORMAL LOW (ref 150–400)
RBC: 3.52 MIL/uL — ABNORMAL LOW (ref 3.87–5.11)
RDW: 17.6 % — ABNORMAL HIGH (ref 11.5–15.5)
WBC: 3.9 10*3/uL — ABNORMAL LOW (ref 4.0–10.5)
nRBC: 0 % (ref 0.0–0.2)

## 2024-02-15 LAB — COMPREHENSIVE METABOLIC PANEL WITH GFR
ALT: 24 U/L (ref 0–44)
AST: 45 U/L — ABNORMAL HIGH (ref 15–41)
Albumin: 3.4 g/dL — ABNORMAL LOW (ref 3.5–5.0)
Alkaline Phosphatase: 83 U/L (ref 38–126)
Anion gap: 5 (ref 5–15)
BUN: 15 mg/dL (ref 8–23)
CO2: 23 mmol/L (ref 22–32)
Calcium: 8.9 mg/dL (ref 8.9–10.3)
Chloride: 110 mmol/L (ref 98–111)
Creatinine, Ser: 0.95 mg/dL (ref 0.44–1.00)
GFR, Estimated: >60 mL/min (ref >60)
Glucose, Bld: 161 mg/dL — ABNORMAL HIGH (ref 70–99)
Potassium: 4.6 mmol/L (ref 3.5–5.1)
Sodium: 138 mmol/L (ref 135–145)
Total Bilirubin: 2.2 mg/dL — ABNORMAL HIGH (ref 0.0–1.2)
Total Protein: 6.3 g/dL — ABNORMAL LOW (ref 6.5–8.1)

## 2024-02-15 LAB — HEMOGLOBIN AND HEMATOCRIT, BLOOD
HCT: 21.8 % — ABNORMAL LOW (ref 36.0–46.0)
Hemoglobin: 6.7 g/dL — ABNORMAL LOW (ref 12.0–15.0)

## 2024-02-15 LAB — VITAMIN B12: Vitamin B-12: 1439 pg/mL — ABNORMAL HIGH (ref 180–914)

## 2024-02-15 LAB — PREPARE RBC (CROSSMATCH)

## 2024-02-15 LAB — HIV ANTIBODY (ROUTINE TESTING W REFLEX): HIV Screen 4th Generation wRfx: NONREACTIVE

## 2024-02-15 SURGERY — EGD (ESOPHAGOGASTRODUODENOSCOPY)
Anesthesia: General

## 2024-02-15 MED ORDER — DEXAMETHASONE SODIUM PHOSPHATE 10 MG/ML IJ SOLN
INTRAMUSCULAR | Status: DC | PRN
  Administered 2024-02-15: 10 mg via INTRAVENOUS

## 2024-02-15 MED ORDER — PROPOFOL 10 MG/ML IV BOLUS
INTRAVENOUS | Status: DC | PRN
  Administered 2024-02-15: 150 ug/kg/min via INTRAVENOUS
  Administered 2024-02-15: 120 mg via INTRAVENOUS

## 2024-02-15 MED ORDER — SODIUM CHLORIDE 0.9 % IV SOLN: INTRAVENOUS | Status: DC

## 2024-02-15 MED ORDER — SODIUM CHLORIDE 0.9% IV SOLUTION: Freq: Once | INTRAVENOUS | Status: AC

## 2024-02-15 MED ORDER — FENTANYL CITRATE (PF) 100 MCG/2ML IJ SOLN
INTRAMUSCULAR | Status: DC | PRN
  Administered 2024-02-15 (×2): 50 ug via INTRAVENOUS

## 2024-02-15 MED ORDER — PROPOFOL 10 MG/ML IV BOLUS
INTRAVENOUS | Status: AC
  Filled 2024-02-15: qty 40

## 2024-02-15 MED ORDER — LAMOTRIGINE 25 MG PO TABS
225.0000 mg | ORAL_TABLET | Freq: Every day | ORAL | Status: DC
  Administered 2024-02-16: 225 mg via ORAL
  Filled 2024-02-15: qty 1

## 2024-02-15 MED ORDER — SUCCINYLCHOLINE CHLORIDE 200 MG/10ML IV SOSY
PREFILLED_SYRINGE | INTRAVENOUS | Status: AC
Start: 2024-02-15 — End: ?
  Filled 2024-02-15: qty 10

## 2024-02-15 MED ORDER — LIDOCAINE HCL (CARDIAC) PF 100 MG/5ML IV SOSY
PREFILLED_SYRINGE | INTRAVENOUS | Status: DC | PRN
  Administered 2024-02-15: 100 mg via INTRAVENOUS

## 2024-02-15 MED ORDER — PANTOPRAZOLE SODIUM 40 MG PO TBEC
40.0000 mg | DELAYED_RELEASE_TABLET | Freq: Every day | ORAL | Status: DC
  Administered 2024-02-15 – 2024-02-16 (×2): 40 mg via ORAL
  Filled 2024-02-15 (×2): qty 1

## 2024-02-15 MED ORDER — LIDOCAINE HCL (PF) 2 % IJ SOLN
INTRAMUSCULAR | Status: AC
  Filled 2024-02-15: qty 5

## 2024-02-15 MED ORDER — SUCCINYLCHOLINE CHLORIDE 200 MG/10ML IV SOSY
PREFILLED_SYRINGE | INTRAVENOUS | Status: DC | PRN
  Administered 2024-02-15: 100 mg via INTRAVENOUS

## 2024-02-15 MED ORDER — FENTANYL CITRATE (PF) 100 MCG/2ML IJ SOLN
INTRAMUSCULAR | Status: AC
  Filled 2024-02-15: qty 2

## 2024-02-15 MED ORDER — ONDANSETRON HCL 4 MG/2ML IJ SOLN
INTRAMUSCULAR | Status: DC | PRN
  Administered 2024-02-15: 4 mg via INTRAVENOUS

## 2024-02-15 MED ORDER — LAMOTRIGINE 100 MG PO TABS
200.0000 mg | ORAL_TABLET | Freq: Once | ORAL | Status: AC
  Administered 2024-02-15: 200 mg via ORAL
  Filled 2024-02-15 (×2): qty 2

## 2024-02-15 NOTE — Progress Notes (Signed)
 [facility]  - [place] [facility]   PATIENT NAME: Margaret [name]    MR#:  [id number]  DATE OF BIRTH:  01/09/1956  SUBJECTIVE:  patient seen earlier. No family at bedside. Came in with hemoglobin of 5.2 from the cancer center. Follows with Dr. Donneta Romberg for iron deficiency anemia and chronic blood loss. Denies any abdominal pain. Has restless leg syndrome.    VITALS:  Blood pressure (!) 165/39, pulse 67, temperature (!) 97.4 F (36.3 C), temperature source Temporal, resp. rate 18, height 5' (1.524 m), weight 83.6 kg, SpO2 98%.  PHYSICAL EXAMINATION:   GENERAL:  68 y.o.-year-old patient with no acute distress.  LUNGS: Normal breath sounds bilaterally, no wheezing CARDIOVASCULAR: S1, S2 normal.  ABDOMEN: Soft, nontender, nondistended. EXTREMITIES: No  edema b/l.    NEUROLOGIC: nonfocal  patient is alert and awake   LABORATORY PANEL:  CBC Recent Labs  Lab 02/15/24 1107  WBC 3.9*  HGB 8.8*  HCT 28.1*  PLT 106*    Chemistries  Recent Labs  Lab 02/15/24 1107  NA 138  K 4.6  CL 110  CO2 23  GLUCOSE 161*  BUN 15  CREATININE 0.95  CALCIUM 8.9  AST 45*  ALT 24  ALKPHOS 83  BILITOT 2.2*   \ Assessment and Plan Caramia Boutin is a 68 y.o. female with medical history significant of HFpEF, aortic stenosis, stage III CKD, type 2 diabetes, NASH cirrhosis, history of renal mass, iron deficiency anemia presenting with acute on chronic anemia, occult GI bleeding.  Patient reports generalized weakness and fatigue over the past 4 to 5 weeks.  Followed by Dr. Mia Creek outpatient for NASH cirrhosis as well as Dr. Donneta Romberg for iron deficiency anemia.  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 Virl Son outpatient for iron deficiency -- patient seen by G.I. Dr. Servando Snare -- EGD- LA Grade A esophagitis with no bleeding. Biopsied.                        - Multiple recently bleeding angiodysplastic lesions                         in the stomach. Treated with argon plasma coagulation                         (APC).                        - Erythematous mucosa in the antrum.                        - Normal examined duodenum. -- Colonoscopy  - A single non-bleeding colonic angiodysplastic                         lesion. Treated with argon plasma coagulation (APC).                        - Two 2 to 3 mm polyps in the descending colon,  removed with a cold biopsy forceps. Resected and                         retrieved.                        - One 3 mm polyp in the sigmoid colon, removed with a                         cold biopsy forceps. --PPI - discontinue Octreotide gtt per GI   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H (nonalcoholic steatohepatitis) (HCC) --Baseline cirrhotic disease secondary to NASH --Followed by Dr. Emerick Hanlon with GI outpatient --PPI     Hepatic encephalopathy (HCC) --Continue rifaximin   Thrombocytopenia (HCC) --Plt count in 110s    Bipolar 1 disorder, depressed, mild (HCC) --Stable  --Cont abilify    Controlled type 2 diabetes mellitus without complication, without long-term current use of insulin (HCC) --Blood sugar 200s  --SSI  --A1C 5.5% (dec 2024)   CKD (chronic kidney disease), stage III a(HCC) Cr 1 w/ GFR 60    HTN (hypertension) --BP stable  --Titrate home regimen       Procedures: EGD/colonoscopy Family communication : none today Consults : G.I. CODE STATUS: full DVT Prophylaxis :SCD Level of care: Med-Surg Status is: Inpatient Remains inpatient appropriate because: G.I. bleed    TOTAL TIME TAKING CARE OF THIS PATIENT: 40 minutes.  >50% time spent on counselling and  coordination of care  Note: This dictation was prepared with Dragon dictation along with smaller phrase technology. Any transcriptional errors that result from this process are unintentional.  Melvinia Stager M.D    [facility]   CC: Primary care physician; Sari Cunning, MD

## 2024-02-15 NOTE — Transfer of Care (Signed)
 Immediate Anesthesia Transfer of Care Note  Patient: Mimi Debellis  Procedure(s) Performed: EGD (ESOPHAGOGASTRODUODENOSCOPY) COLONOSCOPY COLONOSCOPY, WITH ARGON PLASMA COAGULATION IMAGING PROCEDURE, GI TRACT, INTRALUMINAL, VIA CAPSULE  Patient Location: PACU  Anesthesia Type:General  Level of Consciousness: awake and drowsy  Airway & Oxygen Therapy: Patient Spontanous Breathing  Post-op Assessment: Report given to RN and Post -op Vital signs reviewed and stable  Post vital signs: Reviewed and stable  Last Vitals:  Vitals Value Taken Time  BP 127/44 02/15/24 1432  Temp    Pulse 65 02/15/24 1438  Resp 19 02/15/24 1438  SpO2 95 % 02/15/24 1438  Vitals shown include unfiled device data.  Last Pain:  Vitals:   02/15/24 1238  TempSrc: Temporal  PainSc:          Complications: There were no known notable events for this encounter.

## 2024-02-15 NOTE — Plan of Care (Signed)

## 2024-02-15 NOTE — Progress Notes (Signed)
 The patient came in with profound anemia and was transfused.  The patient continues to drop her hemoglobin by 1 g overnight.  The patient had been on a GLP-1 and anesthesia has requested that documentation that the patient is an urgent case prior to putting the patient to sleep with her recent GLP-1 use.  It is highly recommended that this patient undergo this procedure today.

## 2024-02-15 NOTE — Op Note (Addendum)
 [facility] Gastroenterology Patient Name: Margaret [name] Procedure Date: 02/15/2024 1:24 PM MRN: [id number] Account #: [id number] Date of Birth: [birth year] Admit Type: Inpatient Age: 68 Room: [facility] ENDO ROOM 4 Gender: Female Note Status: Finalized Instrument Name: Upper Endoscope [id number] Procedure:             Upper GI endoscopy Indications:           Iron deficiency anemia Providers:             Midge Minium MD, MD Referring MD:          Danella Penton, MD (Referring MD) Medicines:             General Anesthesia Complications:         No immediate complications. Procedure:             Pre-Anesthesia Assessment:                        - Prior to the procedure, a History and Physical was                         performed, and patient medications and allergies were                         reviewed. The patient's tolerance of previous                         anesthesia was also reviewed. The risks and benefits                         of the procedure and the sedation options and risks                         were discussed with the patient. All questions were                         answered, and informed consent was obtained. Prior                         Anticoagulants: The patient has taken no anticoagulant                         or antiplatelet agents. ASA Grade Assessment: III - A                         patient with severe systemic disease. After reviewing                         the risks and benefits, the patient was deemed in                         satisfactory condition to undergo the procedure.                        After obtaining informed consent, the endoscope was                         passed under direct vision. Throughout the procedure,  the patient's blood pressure, pulse, and oxygen                         saturations were monitored continuously. The Endoscope                         was introduced through the mouth, and  advanced to the                         third part of duodenum. The upper GI endoscopy was                         accomplished without difficulty. The patient tolerated                         the procedure well. Findings:      LA Grade A (one or more mucosal breaks less than 5 mm, not extending       between tops of 2 mucosal folds) esophagitis with no bleeding was found       at the gastroesophageal junction. Biopsies were taken with a cold       forceps for histology.      Multiple angiodysplastic lesions with stigmata of recent bleeding were       found in the stomach. Coagulation for hemostasis using argon plasma at 2       liters/minute and 20 watts was successful.      Diffuse moderately erythematous mucosa with bleeding was found in the       gastric antrum.      The examined duodenum was normal. Impression:            - LA Grade A esophagitis with no bleeding. Biopsied.                        - Multiple recently bleeding angiodysplastic lesions                         in the stomach. Treated with argon plasma coagulation                         (APC).                        - Erythematous mucosa in the antrum.                        - Normal examined duodenum. Recommendation:        - Return patient to hospital ward for ongoing care.                        - Resume previous diet.                        - Continue present medications.                        - Await pathology results.                        - Perform a colonoscopy today. Procedure Code(s):     --- Professional ---  43255, 59, Esophagogastroduodenoscopy, flexible,                         transoral; with control of bleeding, any method                        43239, Esophagogastroduodenoscopy, flexible,                         transoral; with biopsy, single or multiple Diagnosis Code(s):     --- Professional ---                        D50.9, Iron deficiency anemia, unspecified                         K31.89, Other diseases of stomach and duodenum                        K31.811, Angiodysplasia of stomach and duodenum with                         bleeding                        K20.90, Esophagitis, unspecified without bleeding CPT copyright 2022 American Medical Association. All rights reserved. The codes documented in this report are preliminary and upon coder review may  be revised to meet current compliance requirements. [name] MD, MD 02/15/2024 2:09:21 PM This report has been signed electronically. Number of Addenda: 0 Note Initiated On: 02/15/2024 1:24 PM Estimated Blood Loss:  Estimated blood loss: none.      [facility]

## 2024-02-15 NOTE — Anesthesia Procedure Notes (Signed)
 Procedure Name: Intubation Date/Time: 02/15/2024 1:53 PM  Performed by: Simona Dublin, CRNAPre-anesthesia Checklist: Patient identified, Emergency Drugs available, Suction available and Patient being monitored Patient Re-evaluated:Patient Re-evaluated prior to induction Oxygen Delivery Method: Circle system utilized Preoxygenation: Pre-oxygenation with 100% oxygen Induction Type: IV induction Ventilation: Mask ventilation without difficulty Laryngoscope Size: McGrath and 3 Grade View: Grade I Tube type: Oral Tube size: 6.5 mm Number of attempts: 1 Airway Equipment and Method: Stylet and Oral airway Placement Confirmation: ETT inserted through vocal cords under direct vision, positive ETCO2 and breath sounds checked- equal and bilateral Secured at: 19 cm Tube secured with: Tape Dental Injury: Teeth and Oropharynx as per pre-operative assessment

## 2024-02-15 NOTE — Progress Notes (Signed)
 OT Cancellation Note  Patient Details Name: Margaret [name] MRN: [id number] DOB: 1956/09/17   Cancelled Treatment:    Reason Eval/Treat Not Completed: Patient not medically ready. Pt is currently getting blood transfusion for low hgb and pending a coloscopy today. Will follow up as appropriate.  Ernestene Coover E Anjel Perfetti 02/15/2024, 8:09 AM

## 2024-02-15 NOTE — Anesthesia Preprocedure Evaluation (Signed)
 Anesthesia Evaluation  Patient identified by MRN, date of birth, ID band Patient awake    Reviewed: Allergy & Precautions, NPO status , Patient's Chart, lab work & pertinent test results  History of Anesthesia Complications Negative for: history of anesthetic complications  Airway Mallampati: III   Neck ROM: Full    Dental  (+) Partial Upper   Pulmonary former smoker (quit 2012)   Pulmonary exam normal breath sounds clear to auscultation       Cardiovascular hypertension, Normal cardiovascular exam+ Valvular Problems/Murmurs (moderate AS)  Rhythm:Regular Rate:Normal  ECG 11/15/23: NSR; possible inferior infarct, age undetermined; unchanged from prior  Echo 10/15/23:  NORMAL LEFT VENTRICULAR SYSTOLIC FUNCTION  NORMAL RIGHT VENTRICULAR SYSTOLIC FUNCTION  TRIVIAL REGURGITATION NOTED   MODERATE VALVULAR STENOSIS  Moderate calcific AS    Neuro/Psych  PSYCHIATRIC DISORDERS Anxiety Depression Bipolar Disorder   negative neurological ROS     GI/Hepatic ,GERD  ,,(+) Cirrhosis  (NASH)        Endo/Other  diabetes, Type 2  Obesity   Renal/GU Renal disease (stage III CKD)     Musculoskeletal  (+) Arthritis ,    Abdominal   Peds  Hematology  (+) Blood dyscrasia, anemia   Anesthesia Other Findings Last dose of Ozempic 02/13/24.  Reproductive/Obstetrics                              Anesthesia Physical Anesthesia Plan  ASA: 3  Anesthesia Plan: General   Post-op Pain Management:    Induction: Intravenous and Rapid sequence  PONV Risk Score and Plan: 3 and Ondansetron, Dexamethasone and Treatment may vary due to age or medical condition  Airway Management Planned: Oral ETT  Additional Equipment:   Intra-op Plan:   Post-operative Plan: Extubation in OR  Informed Consent: I have reviewed the patients History and Physical, chart, labs and discussed the procedure including the risks,  benefits and alternatives for the proposed anesthesia with the patient or authorized representative who has indicated his/her understanding and acceptance.     Dental advisory given  Plan Discussed with: CRNA  Anesthesia Plan Comments: (Patient consented for risks of anesthesia including but not limited to:  - adverse reactions to medications - damage to eyes, teeth, lips or other oral mucosa - nerve damage due to positioning  - sore throat or hoarseness - damage to heart, brain, nerves, lungs, other parts of body or loss of life  Informed patient about role of CRNA in peri- and intra-operative care.  Patient voiced understanding.)         Anesthesia Quick Evaluation

## 2024-02-15 NOTE — Plan of Care (Signed)
       CROSS COVER NOTE  NAME: Margaret [name] MRN: [id number] DOB : 06-22-1956    Concern as stated by nurse / staff   68 f dx anemia hemoglobin 5.2 received 2 units PRBC redraw 7.7, this am 6.7. Hx CHF DM NASH Cirrhosis, Renal Mass. Coloscopy scheduled today      Pertinent findings on chart review:    Latest Ref Rng & Units 02/15/2024    4:24 AM 02/14/2024    9:44 PM 02/14/2024   11:53 AM  CBC  WBC 4.0 - 10.5 K/uL   5.0   Hemoglobin 12.0 - 15.0 g/dL 6.7  7.7  5.2   Hematocrit 36.0 - 46.0 % 21.8  24.7  18.3   Platelets 150 - 400 K/uL   112      Assessment and  Interventions   Assessment:  ABLA with downtrending Hb- colonoscopy later today  Plan: Transfuse 1 unit PRBC X X

## 2024-02-15 NOTE — Progress Notes (Signed)
 PT Cancellation Note  Patient Details Name: Margaret [name] MRN: [id number] DOB: [birth year]   Cancelled Treatment:    Reason Eval/Treat Not Completed: Other (comment). Pt contraindicated for therapy session this date. Receiving blood transfusion this AM and then scheduled for procedure. Will re-attempt as able.   Senita Corredor 02/15/2024, 8:46 AM Amparo Balk, PT, DPT, GCS [phone]

## 2024-02-15 NOTE — Op Note (Signed)
 [facility] Gastroenterology Patient Name: Margaret [name] Procedure Date: 02/15/2024 1:23 PM MRN: [id number] Account #: [id number] Date of Birth: 19-May-1956 Admit Type: Inpatient Age: 68 Room: [facility] ENDO ROOM 4 Gender: Female Note Status: Finalized Instrument Name: Prentice Docker [id number] Procedure:             Colonoscopy Indications:           Iron deficiency anemia Providers:             Midge Minium MD, MD Referring MD:          Danella Penton, MD (Referring MD) Medicines:             Propofol per Anesthesia Complications:         No immediate complications. Procedure:             Pre-Anesthesia Assessment:                        - Prior to the procedure, a History and Physical was                         performed, and patient medications and allergies were                         reviewed. The patient's tolerance of previous                         anesthesia was also reviewed. The risks and benefits                         of the procedure and the sedation options and risks                         were discussed with the patient. All questions were                         answered, and informed consent was obtained. Prior                         Anticoagulants: The patient has taken no anticoagulant                         or antiplatelet agents. ASA Grade Assessment: III - A                         patient with severe systemic disease. After reviewing                         the risks and benefits, the patient was deemed in                         satisfactory condition to undergo the procedure.                        After obtaining informed consent, the colonoscope was                         passed under direct vision. Throughout the procedure,  the patient's blood pressure, pulse, and oxygen                         saturations were monitored continuously. The                         Colonoscope was introduced through the anus and                          advanced to the the cecum, identified by appendiceal                         orifice and ileocecal valve. The colonoscopy was                         performed without difficulty. The patient tolerated                         the procedure well. The quality of the bowel                         preparation was good. Findings:      The perianal and digital rectal examinations were normal.      A single small angiodysplastic lesion without bleeding was found in the       ascending colon. Coagulation for destruction of remaining portion of       lesion using argon plasma at 2 liters/minute and 20 watts was successful.      Two sessile polyps were found in the descending colon. The polyps were 2       to 3 mm in size. These polyps were removed with a cold biopsy forceps.       Resection and retrieval were complete.      A 3 mm polyp was found in the sigmoid colon. The polyp was sessile. The       polyp was removed with a cold biopsy forceps. Resection and retrieval       were complete.      Non-bleeding internal hemorrhoids were found during retroflexion. The       hemorrhoids were Grade II (internal hemorrhoids that prolapse but reduce       spontaneously). Impression:            - A single non-bleeding colonic angiodysplastic                         lesion. Treated with argon plasma coagulation (APC).                        - Two 2 to 3 mm polyps in the descending colon,                         removed with a cold biopsy forceps. Resected and                         retrieved.                        - One 3 mm polyp in the sigmoid colon, removed with a  cold biopsy forceps. Resected and retrieved.                        - Non-bleeding internal hemorrhoids. Recommendation:        - Return patient to hospital ward for ongoing care.                        - Resume previous diet.                        - Continue present medications.                         - Await pathology results.                        - To visualize the small bowel, perform video capsule                         endoscopy today. Procedure Code(s):     --- Professional ---                        [phone], Colonoscopy, flexible; with ablation of                         tumor(s), polyp(s), or other lesion(s) (includes pre-                         and post-dilation and guide wire passage, when                         performed)                        [id number], 59, Colonoscopy, flexible; with biopsy, single                         or multiple Diagnosis Code(s):     --- Professional ---                        D50.9, Iron deficiency anemia, unspecified                        D12.5, Benign neoplasm of sigmoid colon                        K55.20, Angiodysplasia of colon without hemorrhage CPT copyright 2022 [employer]. All rights reserved. The codes documented in this report are preliminary and upon coder review may  be revised to meet current compliance requirements. [name] MD, MD 02/15/2024 2:25:15 PM This report has been signed electronically. Number of Addenda: 0 Note Initiated On: 02/15/2024 1:23 PM Scope Withdrawal Time: 0 hours 6 minutes 9 seconds  Total Procedure Duration: 0 hours 10 minutes 54 seconds  Estimated Blood Loss:  Estimated blood loss: none.      [facility]

## 2024-02-16 ENCOUNTER — Encounter: Payer: Self-pay | Admitting: Gastroenterology

## 2024-02-16 DIAGNOSIS — K5520 Angiodysplasia of colon without hemorrhage: Secondary | ICD-10-CM | POA: Diagnosis not present

## 2024-02-16 DIAGNOSIS — K7581 Nonalcoholic steatohepatitis (NASH): Secondary | ICD-10-CM | POA: Diagnosis not present

## 2024-02-16 DIAGNOSIS — D62 Acute posthemorrhagic anemia: Secondary | ICD-10-CM | POA: Diagnosis not present

## 2024-02-16 DIAGNOSIS — F3131 Bipolar disorder, current episode depressed, mild: Secondary | ICD-10-CM

## 2024-02-16 LAB — TYPE AND SCREEN
ABO/RH(D): B POS
Antibody Screen: NEGATIVE
Unit division: 0
Unit division: 0
Unit division: 0

## 2024-02-16 LAB — BPAM RBC
Blood Product Expiration Date: 202505142359
Blood Product Expiration Date: 202505162359
Blood Product Expiration Date: 202505182359
ISSUE DATE / TIME: 202504150635
ISSUE DATE / TIME: 202504141359
ISSUE DATE / TIME: 202504141814
Unit Type and Rh: 7300
Unit Type and Rh: 5100
Unit Type and Rh: 5100

## 2024-02-16 LAB — SURGICAL PATHOLOGY

## 2024-02-16 NOTE — Progress Notes (Signed)
 Occupational Therapy Evaluation Patient Details Name: Margaret [name] MRN: [id number] DOB: [birth year] Today's Date: 02/16/2024   History of Present Illness   Margaret [name] is a 68 y.o. female with medical history significant of HFpEF, aortic stenosis, stage III CKD, type 2 diabetes, NASH cirrhosis, history of renal mass, iron deficiency anemia presenting with acute on chronic anemia, occult GI bleeding.  Patient reports generalized weakness and fatigue over the past 4 to 5 weeks.  Followed by Dr. Emerick Hanlon outpatient for NASH cirrhosis as well as Dr. Valentine Gasmen for iron deficiency anemia.  Has had worsening weakness and fatigue with exertion     Clinical Impressions Pt was seen for OT/PT evaluation this date. Prior to hospital admission, pt was MODI/indep with ADL/IADLs. Pt lives alone in entry level apartment, ambulates with her rollator at within the home and during community mobility. Pt initially required CGA that progressed to supervision during mobility. Pt completed amb to BR for toileting, required no assistance for pericare. Pt endorses she is at her baseline, pt was educated on energy conservations strategies, fall prevention with use of DME and safe ADL completion once d/c home. Do not anticipate the need for follow up OT services upon acute hospital DC. All questions answered, pt is confident of her abilities to continue completing her ADL/IADLs as she was PTA, OT will sign off.     If plan is discharge home, recommend the following:   Assistance with cooking/housework     Functional Status Assessment   Patient has not had a recent decline in their functional status     Equipment Recommendations   None recommended by OT     Recommendations for Other Services         Precautions/Restrictions   Precautions Precautions: Fall Recall of Precautions/Restrictions: Intact Restrictions Weight Bearing Restrictions Per Provider Order: No     Mobility Bed Mobility                General bed mobility comments: NT in recliner pre/post session    Transfers Overall transfer level: Modified independent Equipment used: Rollator (4 wheels)               General transfer comment: MODI throughout mobility, no physical assistance required during mobility      Balance Overall balance assessment: Needs assistance Sitting-balance support: Feet supported, No upper extremity supported Sitting balance-Leahy Scale: Good Sitting balance - Comments: Good reach outside base of support   Standing balance support: During functional activity, Single extremity supported                               ADL either performed or assessed with clinical judgement   ADL Overall ADL's : Needs assistance/impaired     Grooming: Wash/dry hands;Standing;Adhering to UE precautions;Supervision/safety (Sink level)               Lower Body Dressing: Modified independent   Toilet Transfer: Contact guard assist;Supervision/safety;Ambulation;Regular Toilet;Grab bars;Rollator (4 wheels)   Toileting- Clothing Manipulation and Hygiene: Supervision/safety;Sitting/lateral lean       Functional mobility during ADLs: Supervision/safety;Rollator (4 wheels) General ADL Comments: Completed sink level handwashing, post toileting with use of rollator; initially CGA progressed to supervision.      Pertinent Vitals/Pain Pain Assessment Pain Assessment: No/denies pain     Extremity/Trunk Assessment Upper Extremity Assessment Upper Extremity Assessment: Overall WFL for tasks assessed   Lower Extremity Assessment Lower Extremity Assessment: Generalized weakness;Overall [facility] for tasks assessed  Cervical / Trunk Assessment Cervical / Trunk Assessment: Normal   Communication Communication Communication: No apparent difficulties   Cognition Arousal: Alert Behavior During Therapy: WFL for tasks assessed/performed Cognition: No apparent impairments              OT - Cognition Comments: A/O x4                 Following commands: Intact       Cueing  General Comments   Cueing Techniques: Verbal cues      Exercises Exercises: Other exercises Other Exercises Other Exercises: Edu: role of OT, energy conservations techniques, safe ADL completion, DME safety during amb   Shoulder Instructions      Home Living Family/patient expects to be discharged to:: Private residence Living Arrangements: Alone Available Help at Discharge: Family;Friend(s);Available PRN/intermittently (Son, DIL lives close by and can provide assist PRN) Type of Home: Apartment (Handicap Apartment) Home Access: Level entry     Home Layout: One level     Bathroom Shower/Tub: Producer, television/film/video: Handicapped height Bathroom Accessibility: Yes   Home Equipment: Shower seat - built in;Rollator (4 wheels)   Additional Comments: Patient reports lives in handicap apartment. Sleeps in lift chair      Prior Functioning/Environment Prior Level of Function : Independent/Modified Independent             Mobility Comments: Pt uses Rollator for use of ambulation, MOD I. Reports two falls in past 6 months. ADLs Comments: Still driving. MOD I with ADL's. Caregiver comes approx 2/x week approx 5 hrs week/total helps with cleaning.    OT Problem List:     OT Treatment/Interventions:        OT Goals(Current goals can be found in the care plan section)   Acute Rehab OT Goals Patient Stated Goal: Return home OT Goal Formulation: With patient Time For Goal Achievement: 03/01/24 Potential to Achieve Goals: Good   OT Frequency:       Co-evaluation PT/OT/SLP Co-Evaluation/Treatment: Yes Reason for Co-Treatment: To address functional/ADL transfers   OT goals addressed during session: ADL's and self-care      AM-PAC OT "6 Clicks" Daily Activity     Outcome Measure Help from another person eating meals?: None Help from another person  taking care of personal grooming?: None Help from another person toileting, which includes using toliet, bedpan, or urinal?: None Help from another person bathing (including washing, rinsing, drying)?: A Little Help from another person to put on and taking off regular upper body clothing?: None Help from another person to put on and taking off regular lower body clothing?: None 6 Click Score: 23   End of Session Equipment Utilized During Treatment: Rollator (4 wheels) Nurse Communication: Mobility status  Activity Tolerance: Patient tolerated treatment well Patient left: in chair;with call bell/phone within reach                   Time: 1013-1027 OT Time Calculation (min): 14 min Charges:  OT General Charges $OT Visit: 1 Visit OT Evaluation $OT Eval Low Complexity: 1 Low  Ailana Cuadrado M.S. OTR/L  02/16/24, 11:02 AM

## 2024-02-16 NOTE — Plan of Care (Signed)

## 2024-02-16 NOTE — Discharge Summary (Signed)
 Physician Discharge Summary   Patient: Margaret [name] MRN: [id number] DOB: 03-Nov-1955  Admit date:     02/14/2024  Discharge date: 02/16/24  Discharge Physician: Enedina Finner   PCP: Danella Penton, MD   Recommendations at discharge:   follow-up Dr. Henrene Hawking [facility] G.I. on your next appointment follow-up Dr. Donneta Romberg on your upcoming appointment follow-up PCP in 1 to 2 weeks  Discharge Diagnoses: Principal Problem:   Anemia Active Problems:   Acute blood loss anemia   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H (nonalcoholic steatohepatitis) (HCC)   HTN (hypertension)   CKD (chronic kidney disease), stage III (HCC)   Controlled type 2 diabetes mellitus without complication, without long-term current use of insulin (HCC)   Bipolar 1 disorder, depressed, mild (HCC)   Thrombocytopenia (HCC)   Hepatic encephalopathy (HCC)   Angiodysplasia of intestinal tract   Polyp of descending colon   Gastrointestinal hemorrhage   Mckell [name] is a 68 y.o. female with medical history significant of HFpEF, aortic stenosis, stage III CKD, type 2 diabetes, NASH cirrhosis, history of renal mass, iron deficiency anemia presenting with acute on chronic anemia, occult GI bleeding.  Patient reports generalized weakness and fatigue over the past 4 to 5 weeks.  Followed by Dr. Mia Creek outpatient for NASH cirrhosis as well as Dr. Donneta Romberg for iron deficiency anemia.  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 Virl Son outpatient for iron deficiency -- patient seen by G.I. Dr. Servando Snare -- EGD- LA Grade A esophagitis with no bleeding. Biopsied.                        - Multiple recently bleeding  angiodysplastic lesions                         in the stomach. Treated with argon plasma coagulation                         (APC).                        - Erythematous mucosa in the antrum.                        - Normal examined duodenum. -- Colonoscopy  - A single non-bleeding colonic angiodysplastic                         lesion. Treated with argon plasma coagulation (APC).                        - Two 2 to 3 mm polyps in the descending colon,                         removed with a cold biopsy forceps. Resected and                         retrieved.                        -  One 3 mm polyp in the sigmoid colon, removed with a                         cold biopsy forceps. --PPI - discontinue Octreotide gtt per GI --capsule endoscopy--per Dr Ole Berkeley capsule is still in the stomach. She can get repeat study if needed as out pt -- hemoglobin 8.8. Patient overall doing well. Tolerating PO diet. Will discharge to home.   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H (nonalcoholic steatohepatitis) (HCC) --Baseline cirrhotic disease secondary to NASH --Followed by Dr. Emerick Hanlon with GI outpatient --PPI     Hepatic encephalopathy (HCC) --Continue rifaximin   Thrombocytopenia (HCC) --Plt count in 110s    Bipolar 1 disorder, depressed, mild (HCC) --Stable  --Cont abilify    Controlled type 2 diabetes mellitus without complication, without long-term current use of insulin (HCC) --Blood sugar 200s  --SSI  --A1C 5.5% (dec 2024)   CKD (chronic kidney disease), stage III a(HCC) Cr 1 w/ GFR 60    HTN (hypertension) --BP stable  --Titrate home regimen    discharge to home with outpatient follow-up Dr. Emerick Hanlon     Procedures: EGD/colonoscopy Family communication : daughter Amy on the phone Consults : G.I. CODE STATUS: full DVT Prophylaxis :SCD Level of care: Med-Surg     Pain control - [place]  Controlled Substance Reporting System database was reviewed. and patient was instructed,  not to drive, operate heavy machinery, perform activities at heights, swimming or participation in water activities or provide baby-sitting services while on Pain, Sleep and Anxiety Medications; until their outpatient Physician has advised to do so again. Also recommended to not to take more than prescribed Pain, Sleep and Anxiety Medications.  Disposition: Home Diet recommendation:  Discharge Diet Orders (From admission, onward)     Start     Ordered   02/16/24 0000  Diet - low sodium heart healthy        02/16/24 1403           Cardiac diet DISCHARGE MEDICATION: Allergies as of 02/16/2024       Reactions   Lithium Nausea And Vomiting, Other (See Comments)   Penicillins Hives   Resulted in hospitalization   Sulfa Antibiotics Hives   Resulted in hospitalization   Benzonatate Other (See Comments)        Medication List     TAKE these medications    amLODipine 5 MG tablet Commonly known as: NORVASC Take 5 mg by mouth daily.   ARIPiprazole 10 MG tablet Commonly known as: ABILIFY Take 1 tablet (10 mg total) by mouth daily. What changed: when to take this   benztropine 0.5 MG tablet Commonly known as: COGENTIN Take 1 tablet (0.5 mg total) by mouth 2 (two) times daily as needed for tremors.   escitalopram 10 MG tablet Commonly known as: LEXAPRO TAKE ONE TABLET BY MOUTH ONE TIME DAILY What changed: when to take this   furosemide 20 MG tablet Commonly known as: LASIX Take 20 mg by mouth daily as needed for fluid or edema.   HYDROcodone-acetaminophen 10-325 MG tablet Commonly known as: Norco Take 1 tablet by mouth every 8 (eight) hours as needed for severe pain (pain score 7-10). Must last 30 days.   lamoTRIgine 200 MG tablet Commonly known as: LAMICTAL TAKE ONE TABLET BY MOUTH ONE TIME DAILY What changed: how much to take   lamoTRIgine 25 MG tablet Commonly known as: LaMICtal Take 1 tablet (25 mg total) by mouth daily.  Take daily with 200 mg , total of 225  mg What changed: Another medication with the same name was changed. Make sure you understand how and when to take each.   metFORMIN 500 MG tablet Commonly known as: GLUCOPHAGE Take 500 mg by mouth 2 (two) times daily with a meal.   Ozempic (1 MG/DOSE) 4 MG/3ML Sopn Generic drug: Semaglutide (1 MG/DOSE) Inject 1 mg into the skin once a week. Sundays   pantoprazole 40 MG tablet Commonly known as: PROTONIX Take 40 mg by mouth every morning.   polyethylene glycol powder 17 GM/SCOOP powder Commonly known as: GLYCOLAX/MIRALAX Take 17 g by mouth daily.   spironolactone 25 MG tablet Commonly known as: ALDACTONE Take 25 mg by mouth daily.   tizanidine 2 MG capsule Commonly known as: ZANAFLEX Take 2 mg by mouth 3 (three) times daily as needed for muscle spasms. Takes differently   Xifaxan 550 MG Tabs tablet Generic drug: rifaximin Take 550 mg by mouth 2 (two) times daily.        Follow-up Information     Sari Cunning, MD Follow up.   Specialty: Internal Medicine Why: Hospital follow up Contact information: [address] [facility] [facility] [place] [address] [phone]         Gwyn Leos, MD. Go to.   Specialties: Internal Medicine, Oncology Why: on your next upcoming appt Contact information: [address] [place] [address] [phone]         Shane Darling, MD. Go to.   Specialty: Gastroenterology Why: on your next appt. call GI office if appt needs to be made sooner Contact information: [address] [place] [address] [phone]                Discharge Exam: Cleavon Curls Weights   02/14/24 1136 02/14/24 1955  Weight: 81 kg 83.6 kg  GENERAL:  68 y.o.-year-old patient with no acute distress.  LUNGS: Normal breath sounds bilaterally, no wheezing CARDIOVASCULAR: S1, S2 normal.  ABDOMEN: Soft, nontender, nondistended. EXTREMITIES: No  edema b/l.    NEUROLOGIC: nonfocal  patient is  alert and awake   Condition at discharge: fair  The results of significant diagnostics from this hospitalization (including imaging, microbiology, ancillary and laboratory) are listed below for reference.   Imaging Studies: IR Radiologist Eval & Mgmt Result Date: 01/18/2024 EXAM: NEW PATIENT OFFICE VISIT CHIEF COMPLAINT: SEE NOTE IN EPIC HISTORY OF PRESENT ILLNESS: SEE NOTE IN EPIC REVIEW OF SYSTEMS: SEE NOTE IN EPIC PHYSICAL EXAMINATION: SEE NOTE IN EPIC ASSESSMENT AND PLAN: SEE NOTE IN EPIC Electronically Signed   By: Fernando Hoyer M.D.   On: 01/18/2024 15:10    Microbiology: Results for orders placed or performed in visit on 04/23/23  Microscopic Examination     Status: Abnormal   Collection Time: 04/23/23  9:47 AM   Urine  Result Value Ref Range Status   WBC, UA 11-30 (A) 0 - 5 /hpf Final   RBC, Urine 0-2 0 - 2 /hpf Final   Epithelial Cells (non renal) >10 (A) 0 - 10 /hpf Final   Casts Present (A) None seen /lpf Final   Cast Type Hyaline casts N/A Final   Mucus, UA Present (A) Not Estab. Final   Bacteria, UA Many (A) None seen/Few Final    Labs: CBC: Recent Labs  Lab 02/14/24 0955 02/14/24 1153 02/14/24 2144 02/15/24 0424 02/15/24 1107  WBC 5.3 5.0  --   --  3.9*  NEUTROABS  3.9 3.8  --   --   --   HGB 5.1* 5.2* 7.7* 6.7* 8.8*  HCT 18.1* 18.3* 24.7* 21.8* 28.1*  MCV 77.7* 78.2*  --   --  79.8*  PLT 111* 112*  --   --  106*   Basic Metabolic Panel: Recent Labs  Lab 02/14/24 0955 02/14/24 1153 02/15/24 1107  NA 137 139 138  K 4.6 4.2 4.6  CL 104 106 110  CO2 22 23 23   GLUCOSE 200* 171* 161*  BUN 18 19 15   CREATININE 1.03* 1.06* 0.95  CALCIUM 8.9 9.0 8.9   Liver Function Tests: Recent Labs  Lab 02/14/24 1153 02/15/24 1107  AST 30 45*  ALT 19 24  ALKPHOS 84 83  BILITOT 1.0 2.2*  PROT 6.5 6.3*  ALBUMIN 3.5 3.4*    Discharge time spent: greater than 30 minutes.  Signed: Melvinia Stager, MD [facility] 02/16/2024

## 2024-02-16 NOTE — Evaluation (Signed)
 Physical Therapy Evaluation Patient Details Name: Margaret [name] MRN: [id number] DOB: July 16, 1956 Today's Date: 02/16/2024  History of Present Illness  Margaret [name] is a 68 y.o. female with medical history significant of HFpEF, aortic stenosis, stage III CKD, type 2 diabetes, NASH cirrhosis, history of renal mass, iron deficiency anemia presenting with acute on chronic anemia. Patient admitted with worsening weakness/fatigue.   Clinical Impression  Patient received in recliner, agreeable to PT evaluation. Prior to admission, patient was MOD I with mobility/ADLs. Does have caregiver approx 5/hours weekly to assist with chores/IADLs. Patient lives alone in handicap accessible apartment, level entry. Family lives close by and can provide assist PRN if needed. Patient was MOD I with STS transfer, ambulation with use of Rollator. No overt LOB/unsteadiness noted with ambulation, patient does endorse history of falls and feel as balance has slowly decreased over last few years. Patient seems to be functioning at baseline. No skilled acute PT services warranted at this time, will discharge in house. All further PT needs can be met at next level of care. Please re-consult if additional needs arrive. PT will sign off.     If plan is discharge home, recommend the following:     Can travel by private vehicle        Equipment Recommendations None recommended by PT  Recommendations for Other Services       Functional Status Assessment Patient has not had a recent decline in their functional status     Precautions / Restrictions Precautions Precautions: Fall Restrictions Weight Bearing Restrictions Per Provider Order: No      Mobility  Bed Mobility               General bed mobility comments: Not observed, patient recieved and left in recliner. Patient also sleeps in lift chair at baseline    Transfers Overall transfer level: Modified independent Equipment used: Rollator (4 wheels)                General transfer comment: Pt able to stand from various surfaces (recliner, toilet) Mod I. No physical assist. Good stability overall.    Ambulation/Gait Ambulation/Gait assistance: Modified independent (Device/Increase time) Gait Distance (Feet): 300 Feet Assistive device: Rollator (4 wheels) Gait Pattern/deviations: WFL(Within Functional Limits)       General Gait Details: Pt able to ambulate with Rollator MOD I. No overt LOB or unsteadiness observed with ambulation, approx 300 ft. Mild fatigue. Pt feels she is at baseline function. Denies lighteadedness/dizziness.  Stairs            Wheelchair Mobility     Tilt Bed    Modified Rankin (Stroke Patients Only)       Balance Overall balance assessment: Modified Independent (use of rollator)                                           Pertinent Vitals/Pain Pain Assessment Pain Assessment: No/denies pain    Home Living Family/patient expects to be discharged to:: Private residence Living Arrangements: Alone Available Help at Discharge: Family;Friend(s);Available PRN/intermittently (Son, DIL lives close by and can provide assist PRN) Type of Home: Apartment (Handicap Apartment) Home Access: Level entry       Home Layout: One level Home Equipment: Shower seat - built in;Rollator (4 wheels);Lift chair Additional Comments: Patient reports lives in handicap apartment. Sleeps in lift chair    Prior Function Prior Level of  Function : Independent/Modified Independent             Mobility Comments: Pt uses Rollator for use of ambulation, MOD I. Reports two falls in past 6 months. ADLs Comments: Still driving. MOD I with ADL's. Caregiver comes approx 2/x week approx 5 hrs week/total helps with cleaning.     Extremity/Trunk Assessment   Upper Extremity Assessment Upper Extremity Assessment: Defer to OT evaluation    Lower Extremity Assessment Lower Extremity Assessment: Overall  WFL for tasks assessed       Communication   Communication Communication: No apparent difficulties    Cognition Arousal: Alert Behavior During Therapy: WFL for tasks assessed/performed   PT - Cognitive impairments: No apparent impairments                         Following commands: Intact       Cueing       General Comments      Exercises     Assessment/Plan    PT Assessment All further PT needs can be met in the next venue of care  PT Problem List Decreased balance;Decreased mobility;Decreased strength       PT Treatment Interventions      PT Goals (Current goals can be found in the Care Plan section)  Acute Rehab PT Goals Patient Stated Goal: Get Home PT Goal Formulation: With patient    Frequency       Co-evaluation               AM-PAC PT "6 Clicks" Mobility  Outcome Measure Help needed turning from your back to your side while in a flat bed without using bedrails?: None Help needed moving from lying on your back to sitting on the side of a flat bed without using bedrails?: None Help needed moving to and from a bed to a chair (including a wheelchair)?: None Help needed standing up from a chair using your arms (e.g., wheelchair or bedside chair)?: None Help needed to walk in hospital room?: None Help needed climbing 3-5 steps with a railing? : None 6 Click Score: 24    End of Session Equipment Utilized During Treatment: Gait belt Activity Tolerance: Patient tolerated treatment well Patient left: in chair;Other (comment) (with OT present in room; handoff completed) Nurse Communication: Mobility status PT Visit Diagnosis: History of falling (Z91.81);Unsteadiness on feet (R26.81)    Time: 1610-9604 PT Time Calculation (min) (ACUTE ONLY): 18 min   Charges:   PT Evaluation $PT Eval Low Complexity: 1 Low   PT General Charges $$ ACUTE PT VISIT: 1 Visit         Quillian Brunt Fairly, PT, DPT 02/16/24 10:39 AM

## 2024-02-17 ENCOUNTER — Ambulatory Visit: Payer: Self-pay

## 2024-02-17 ENCOUNTER — Ambulatory Visit: Payer: 59 | Admitting: Cardiology

## 2024-02-17 NOTE — Telephone Encounter (Signed)
 Reason for Disposition . [1] MILD or MODERATE muscle aches or pain AND [2] taking a statin medicine (a lipid or cholesterol lowering drug)  Protocols used: Muscle Aches and Body Pain-A-AH

## 2024-02-17 NOTE — Telephone Encounter (Signed)
   Chief Complaint: body aches Symptoms: body aches  Frequency: yesterday Pertinent Negatives: Patient denies fever, bleeding, rash Disposition: [] ED /[x] Urgent Care (no appt availability in office) / [] Appointment(In office/virtual)/ []  [facility]/ [] Home Care/ [] Refused Recommended Disposition /[] [facility]/ []  Follow-up with PCP Additional Notes: Patient reports she had an EGD completed on 02/15/24. Patient reports she has been experiencing full body aches since yesterday. Patient reports she has had EGD's in the past and understands she can expect some abdominal pain, but reports this time is different as she is having muscle pains in her shoulders and knees as well. Patient denies fever, vomiting, or any other symptoms. Attempted to schedule in office but no new pt appts due to not being established, so patient advised to follow-up for further eval at [facility]. Patient advised to call back with worsening symptoms. Patient verbalized understanding.   Copied from CRM [id number]. Topic: Clinical - Medical Advice >> Feb 17, 2024  1:41 PM Santiya F wrote: Reason for CRM: Patient is calling in because she has been experiencing pain in the abdomen post surgery. Patient wants to know is the pain normal or is it something she needs to be concerned about. Patient says she also experiences the pain when she uses the restroom. Answer Assessment - Initial Assessment Questions 1. ONSET: "When did the muscle aches or body pains start?"      30 min after left hospital for endoscopy 2. LOCATION: "What part of your body is hurting?" (e.g., entire body, arms, legs)      Knees and shoulders 3. SEVERITY: "How bad is the pain?" (Scale 1-10; or mild, moderate, severe)   - MILD (1-3): doesn't interfere with normal activities    - MODERATE (4-7): interferes with normal activities or awakens from sleep    - SEVERE (8-10):  excruciating pain, unable to do any normal activities      moderate 4. CAUSE:  "What do you think is causing the pains?"     unsure 5. FEVER: "Have you been having fever?"     denies 6. OTHER SYMPTOMS: "Do you have any other symptoms?" (e.g., chest pain, weakness, rash, cold or flu symptoms, weight loss)     Body aches  Protocols used: Muscle Aches and Body Pain-A-AH

## 2024-02-19 NOTE — Anesthesia Postprocedure Evaluation (Signed)
 Anesthesia Post Note  Patient: Secondary school teacher  Procedure(s) Performed: EGD (ESOPHAGOGASTRODUODENOSCOPY) COLONOSCOPY COLONOSCOPY, WITH ARGON PLASMA COAGULATION IMAGING PROCEDURE, GI TRACT, INTRALUMINAL, VIA CAPSULE POLYPECTOMY, INTESTINE EGD, WITH ARGON PLASMA COAGULATION  Patient location during evaluation: Endoscopy Anesthesia Type: General Level of consciousness: awake and alert Pain management: pain level controlled Vital Signs Assessment: post-procedure vital signs reviewed and stable Respiratory status: spontaneous breathing, nonlabored ventilation, respiratory function stable and patient connected to nasal cannula oxygen Cardiovascular status: blood pressure returned to baseline and stable Postop Assessment: no apparent nausea or vomiting Anesthetic complications: no   There were no known notable events for this encounter.   Last Vitals:  Vitals:   02/16/24 0518 02/16/24 0857  BP: (!) 139/49 (!) 146/52  Pulse: 60 60  Resp: 19 18  Temp: 36.5 C 36.6 C  SpO2: 94% 97%    Last Pain:  Vitals:   02/16/24 0932  TempSrc:   PainSc: 3                  Vanice Genre

## 2024-02-21 ENCOUNTER — Inpatient Hospital Stay

## 2024-02-21 ENCOUNTER — Inpatient Hospital Stay (HOSPITAL_BASED_OUTPATIENT_CLINIC_OR_DEPARTMENT_OTHER): Admitting: Nurse Practitioner

## 2024-02-21 ENCOUNTER — Encounter: Payer: Self-pay | Admitting: Nurse Practitioner

## 2024-02-21 VITALS — BP 121/41 | HR 68 | Temp 97.9°F | Resp 19 | Wt 176.3 lb

## 2024-02-21 VITALS — BP 130/51 | HR 66 | Resp 18

## 2024-02-21 DIAGNOSIS — D500 Iron deficiency anemia secondary to blood loss (chronic): Secondary | ICD-10-CM | POA: Diagnosis not present

## 2024-02-21 DIAGNOSIS — D649 Anemia, unspecified: Secondary | ICD-10-CM

## 2024-02-21 LAB — CBC WITH DIFFERENTIAL (CANCER CENTER ONLY)
Abs Immature Granulocytes: 0.04 10*3/uL (ref 0.00–0.07)
Basophils Absolute: 0 10*3/uL (ref 0.0–0.1)
Basophils Relative: 0 %
Eosinophils Absolute: 0 10*3/uL (ref 0.0–0.5)
Eosinophils Relative: 1 %
HCT: 30 % — ABNORMAL LOW (ref 36.0–46.0)
Hemoglobin: 9 g/dL — ABNORMAL LOW (ref 12.0–15.0)
Immature Granulocytes: 1 %
Lymphocytes Relative: 14 %
Lymphs Abs: 0.8 10*3/uL (ref 0.7–4.0)
MCH: 24.4 pg — ABNORMAL LOW (ref 26.0–34.0)
MCHC: 30 g/dL (ref 30.0–36.0)
MCV: 81.3 fL (ref 80.0–100.0)
Monocytes Absolute: 0.5 10*3/uL (ref 0.1–1.0)
Monocytes Relative: 9 %
Neutro Abs: 4.1 10*3/uL (ref 1.7–7.7)
Neutrophils Relative %: 75 %
Platelet Count: 82 10*3/uL — ABNORMAL LOW (ref 150–400)
RBC: 3.69 MIL/uL — ABNORMAL LOW (ref 3.87–5.11)
RDW: 18.3 % — ABNORMAL HIGH (ref 11.5–15.5)
WBC Count: 5.4 10*3/uL (ref 4.0–10.5)
nRBC: 0 % (ref 0.0–0.2)

## 2024-02-21 LAB — SAMPLE TO BLOOD BANK

## 2024-02-21 MED ORDER — IRON SUCROSE 20 MG/ML IV SOLN
200.0000 mg | Freq: Once | INTRAVENOUS | Status: AC
  Administered 2024-02-21: 200 mg via INTRAVENOUS
  Filled 2024-02-21: qty 10

## 2024-02-21 NOTE — Progress Notes (Signed)
 [facility] CONSULT NOTE  Patient Care Team: Margaret Cunning, MD as PCP - General (Internal Medicine) Margaret Leos, MD as Consulting Physician (Internal Medicine) Margaret Darling, MD as Consulting Physician (Gastroenterology)  CHIEF COMPLAINTS/PURPOSE OF CONSULTATION: Anemia/thrombocytopenia  HEMATOLOGY HISTORY  06/21/2019-06/24/2019 due to severe anemia; hemoglobin of 5.7 upon admission; patient received 2 units of blood transfusion, 3 doses of IV iron . EGD and colonoscopy were performed during the hospitalization EGD and colonoscopy on 06/23/2019. [Dr.Anna; [facility]] EGD showed no abnormalities and the colonoscopy showed 3 sessile diminutive polyps which were resected completely.  Arteries of the duodenum were normal.  3 colon polyps resected were serrated polyp x2 and a tubular adenoma x1. No active source of bleeding was noted.    # Cirrhosis NAFLD- [? [facility]]  HISTORY OF PRESENTING ILLNESS: ambulating with rolling walker [arthritis].   Margaret [name] 68 y.o.  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   She saw Dr Valentine Gasmen on 02/14/24 and was found to have hemoglobin 5.1 with significant symptoms. She went to ER and was admitted. On 02/15/24, she underwent EGD with Dr Ole Berkeley which revealed LA  Grade A esophagitis w/o bleeding. Biopsied. 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 She also underwent colonoscopy which revealed single nonbleeding colonic angiodysplastic lesion treated with APC. 2 small polyps were removed. She also underwent capsule study. She received several blood transfusions. She was discharged home on 02/16/24.   She feels much better since hospitalization. Palpitations and dizziness have resolved. She has chronically black/dark  stools which is unchanged. She has ongoing fatigue.    Review of Systems  Constitutional:  Negative for chills, diaphoresis, fever, malaise/fatigue and weight loss.  Respiratory:  Negative for cough, hemoptysis, sputum production, shortness of breath and wheezing.   Cardiovascular:  Negative for chest pain, palpitations, orthopnea and leg swelling.  Gastrointestinal:  Positive for melena. Negative for abdominal pain, blood in stool, constipation, diarrhea, heartburn, nausea and vomiting.  Genitourinary:  Negative for dysuria, hematuria and urgency.  Musculoskeletal:  Positive for back pain and joint pain. Negative for falls.  Skin:  Negative for itching and rash.  Neurological:  Negative for dizziness, tingling, focal weakness, weakness and headaches.  Endo/Heme/Allergies:  Does not bruise/bleed easily.  Psychiatric/Behavioral:  Negative for depression. The patient is not nervous/anxious and does not have insomnia.    MEDICAL HISTORY:  Past Medical History:  Diagnosis Date   (HFpEF) heart failure with preserved ejection fraction (HCC)    a.) TTE 12/15/2022: EF >55%, mild LVH, G1DD, mild LAE, mild MAC, triv AR/MR/TR, mild TR, RVSP 30.7, mod AS (MPG 20)   Anxiety    Aortic atherosclerosis (HCC)    Aortic stenosis    a.) TTE 12/15/2022: moderate AS (MPG 20 mmHg; AVA = 1.7 cm2)   Bilateral carotid artery disease (HCC)    a.) doppler 12/22/2020: <50% BICA   Bilateral renal cysts    Bipolar 1 disorder (HCC)    CAD (coronary artery disease) 11/04/2022   a.) CT chest 11/04/2022: 3v CAD   Cardiac murmur    CKD (chronic kidney disease), stage III (HCC)    DDD (degenerative disc disease), lumbar    Depression    DM (diabetes mellitus), type 2 (HCC)    Former smoker    Frequent falls    GERD (gastroesophageal reflux disease)  H/O: GI bleed    Heart murmur    History of blood transfusion    History of MRSA infection 2017   HLD (hyperlipidemia)    Hypertension    IDA (iron  deficiency  anemia)    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H [facility])    Multiple pulmonary nodules    Nephrolithiasis    Occasional tremors    Osteoarthritis    Osteoporosis    Portal hypertensive gastropathy (HCC)    Portal venous hypertension (HCC)    Renal mass, right 03/30/2023   a.) MRI ABD 03/30/2023: 1.2 x 1.2 cm complex cystic renal cortical mass in the posterior mid to low RIGHT kidney (Bosniak 3); b.) MRI ABD 08/03/2023: interval increase in size to 1.4 x 1.3 cm -> concerning for RCC; c.) US  ABD 10/12/2023: 1.7 complex cystic mass RIGHT kidney; d.) MRI ABD 11/15/2023: interval increase in size to 1.6 x 1.2 x 1.0 cm   Splenomegaly    Thoracic spondylosis    Thrombocytopenia (HCC)    Tubular adenoma of colon    Uses walker    Venous insufficiency    Ventral hernia    SURGICAL HISTORY: Past Surgical History:  Procedure Laterality Date   ABDOMINAL HYSTERECTOMY     CHOLECYSTECTOMY     COLONOSCOPY N/A 02/15/2024   Procedure: COLONOSCOPY;  Surgeon: [name], MD;  Location: [facility] ENDOSCOPY;  Service: Endoscopy;  Laterality: N/A;   COLONOSCOPY  02/15/2024   Procedure: COLONOSCOPY, WITH ARGON PLASMA COAGULATION;  Surgeon: [name], MD;  Location: [facility] ENDOSCOPY;  Service: Endoscopy;;   COLONOSCOPY WITH PROPOFOL  N/A 06/23/2019   Procedure: COLONOSCOPY WITH PROPOFOL ;  Surgeon: Luke Salaam, MD;  Location: [facility] ENDOSCOPY;  Service: Gastroenterology;  Laterality: N/A;   COLONOSCOPY WITH PROPOFOL  N/A 12/08/2022   Procedure: COLONOSCOPY WITH PROPOFOL ;  Surgeon: Margaret Darling, MD;  Location: [facility] ENDOSCOPY;  Service: Endoscopy;  Laterality: N/A;   COLONOSCOPY WITH PROPOFOL  N/A 05/18/2023   Procedure: COLONOSCOPY WITH PROPOFOL ;  Surgeon: Margaret Darling, MD;  Location: [facility] ENDOSCOPY;  Service: Endoscopy;  Laterality: N/A;   ESOPHAGOGASTRODUODENOSCOPY N/A 02/15/2024   Procedure: EGD (ESOPHAGOGASTRODUODENOSCOPY);  Surgeon: [name], MD;  Location: [facility] ENDOSCOPY;  Service: Endoscopy;   Laterality: N/A;   ESOPHAGOGASTRODUODENOSCOPY (EGD) WITH PROPOFOL  N/A 06/23/2019   Procedure: ESOPHAGOGASTRODUODENOSCOPY (EGD) WITH PROPOFOL ;  Surgeon: Luke Salaam, MD;  Location: [facility] ENDOSCOPY;  Service: Gastroenterology;  Laterality: N/A;   ESOPHAGOGASTRODUODENOSCOPY (EGD) WITH PROPOFOL  N/A 12/08/2022   Procedure: ESOPHAGOGASTRODUODENOSCOPY (EGD) WITH PROPOFOL ;  Surgeon: Margaret Darling, MD;  Location: [facility] ENDOSCOPY;  Service: Endoscopy;  Laterality: N/A;  DM, OZEMPIC   ESOPHAGOGASTRODUODENOSCOPY (EGD) WITH PROPOFOL  N/A 01/12/2023   Procedure: ESOPHAGOGASTRODUODENOSCOPY (EGD) WITH PROPOFOL ;  Surgeon: Margaret Darling, MD;  Location: [facility] ENDOSCOPY;  Service: Endoscopy;  Laterality: N/A;  DM   GIVENS CAPSULE STUDY  02/15/2024   Procedure: IMAGING PROCEDURE, GI TRACT, INTRALUMINAL, VIA CAPSULE;  Surgeon: [name], MD;  Location: [facility] ENDOSCOPY;  Service: Endoscopy;;   HOT HEMOSTASIS  02/15/2024   Procedure: EGD, WITH ARGON PLASMA COAGULATION;  Surgeon: [name], MD;  Location: [facility] ENDOSCOPY;  Service: Endoscopy;;   IR RADIOLOGIST EVAL & MGMT  10/28/2023   IR RADIOLOGIST EVAL & MGMT  01/18/2024   POLYPECTOMY  05/18/2023   Procedure: POLYPECTOMY;  Surgeon: Margaret Darling, MD;  Location: [facility] ENDOSCOPY;  Service: Endoscopy;;   POLYPECTOMY  02/15/2024   Procedure: POLYPECTOMY, INTESTINE;  Surgeon: [name], MD;  Location: [facility] ENDOSCOPY;  Service: Endoscopy;;   TONSILLECTOMY     TOTAL  HIP ARTHROPLASTY Right 2017   SOCIAL HISTORY: Social History   Socioeconomic History   Marital status: Divorced    Spouse name: Not on file   Number of children: 2   Years of education: Not on file   Highest education level: Not on file  Occupational History   Not on file  Tobacco Use   Smoking status: Former    Current packs/day: 0.00    Types: Cigarettes    Quit date: 03/03/2011    Years since quitting: 12.9   Smokeless tobacco: Never  Vaping Use   Vaping status: Never Used   Substance and Sexual Activity   Alcohol use: Yes    Comment: rarely   Drug use: Not Currently   Sexual activity: Not Currently  Other Topics Concern   Not on file  Social History Narrative   Not on file   Social Drivers of Health   Financial Resource Strain: Low Risk  (06/29/2023)   Received from [facility]   Overall Financial Resource Strain (CARDIA)    Difficulty of Paying Living Expenses: Not hard at all  Food Insecurity: No Food Insecurity (02/14/2024)   Hunger Vital Sign    Worried About Running Out of Food in the Last Year: Never true    Ran Out of Food in the Last Year: Never true  Transportation Needs: No Transportation Needs (02/14/2024)   PRAPARE - Administrator, Civil Service (Medical): No    Lack of Transportation (Non-Medical): No  Physical Activity: Inactive (06/16/2021)   Received from [facility]   Exercise Vital Sign  Stress: No Stress Concern Present (06/16/2021)   Received from [facility] of Occupational Health - Occupational Stress Questionnaire  Social Connections: Moderately Integrated (02/14/2024)   Social Connection and Isolation Panel [NHANES]    Frequency of Communication with Friends and Family: More than three times a week    Frequency of Social Gatherings with Friends and Family: More than three times a week    Attends Religious Services: More than 4 times per year    Active Member of [employer] or Organizations: Yes    Attends Engineer, structural: More than 4 times per year    Marital Status: Divorced  Intimate Partner Violence: Not At Risk (02/14/2024)   Humiliation, Afraid, Rape, and Kick questionnaire    Fear of Current or Ex-Partner: No    Emotionally Abused: No    Physically Abused: No    Sexually Abused: No   FAMILY HISTORY: Family History  Problem Relation Age of Onset   Lung cancer Mother    Heart attack Father 57   Mental illness Father     Bipolar disorder Brother    Bipolar disorder Paternal Uncle    Bipolar disorder Paternal Uncle    ALLERGIES:  is allergic to lithium, penicillins, sulfa antibiotics, and benzonatate.  MEDICATIONS:  Current Outpatient Medications  Medication Sig Dispense Refill   amLODipine  (NORVASC ) 5 MG tablet Take 5 mg by mouth daily.     ARIPiprazole  (ABILIFY ) 10 MG tablet Take 1 tablet (10 mg total) by mouth daily. 90 tablet 0   benztropine  (COGENTIN ) 0.5 MG tablet Take 1 tablet (0.5 mg total) by mouth 2 (two) times daily as needed for tremors. 60 tablet 1   escitalopram  (LEXAPRO ) 10 MG tablet TAKE ONE TABLET BY MOUTH ONE TIME DAILY (Patient taking differently: Take 10 mg by mouth at bedtime.) 90 tablet 3  furosemide (LASIX) 20 MG tablet Take 20 mg by mouth daily as needed for fluid or edema.     HYDROcodone -acetaminophen  (NORCO) 10-325 MG tablet Take 1 tablet by mouth every 8 (eight) hours as needed for severe pain (pain score 7-10). Must last 30 days. 90 tablet 0   lamoTRIgine  (LAMICTAL ) 200 MG tablet TAKE ONE TABLET BY MOUTH ONE TIME DAILY (Patient taking differently: Take 225 mg by mouth daily.) 90 tablet 3   lamoTRIgine  (LAMICTAL ) 25 MG tablet Take 1 tablet (25 mg total) by mouth daily. Take daily with 200 mg , total of 225 mg 90 tablet 0   metFORMIN (GLUCOPHAGE) 500 MG tablet Take 500 mg by mouth 2 (two) times daily with a meal.     OZEMPIC, 1 MG/DOSE, 4 MG/3ML SOPN Inject 1 mg into the skin once a week. Sundays     pantoprazole  (PROTONIX ) 40 MG tablet Take 40 mg by mouth every morning.     polyethylene glycol powder (GLYCOLAX/MIRALAX) 17 GM/SCOOP powder Take 17 g by mouth daily.     spironolactone (ALDACTONE) 25 MG tablet Take 25 mg by mouth daily.     tizanidine (ZANAFLEX) 2 MG capsule Take 2 mg by mouth 3 (three) times daily as needed for muscle spasms. Takes differently     XIFAXAN  550 MG TABS tablet Take 550 mg by mouth 2 (two) times daily.     No current facility-administered medications for  this visit.     PHYSICAL EXAMINATION: Vitals:   02/21/24 1337  BP: (!) 121/41  Pulse: 68  Resp: 19  Temp: 97.9 F (36.6 C)  SpO2: 98%   Filed Weights   02/21/24 1337  Weight: 176 lb 4.8 oz (80 kg)   Physical Exam Vitals reviewed.  Constitutional:      Appearance: She is not ill-appearing.  Eyes:     General: No scleral icterus. Cardiovascular:     Rate and Rhythm: Normal rate and regular rhythm.  Pulmonary:     Effort: No respiratory distress.  Abdominal:     General: There is no distension.     Palpations: Abdomen is soft.     Tenderness: There is no abdominal tenderness. There is no guarding.  Skin:    General: Skin is warm and dry.  Neurological:     Mental Status: She is alert and oriented to person, place, and time.  Psychiatric:        Mood and Affect: Mood normal.        Behavior: Behavior normal.     LABORATORY DATA:  I have reviewed the data as listed Lab Results  Component Value Date   WBC 5.4 02/21/2024   HGB 9.0 (L) 02/21/2024   HCT 30.0 (L) 02/21/2024   MCV 81.3 02/21/2024   PLT 82 (L) 02/21/2024   Iron /TIBC/Ferritin/ %Sat    Component Value Date/Time   IRON  46 02/14/2024 2144   IRON  37 12/06/2019 1507   TIBC 581 (H) 02/14/2024 2144   TIBC 428 12/06/2019 1507   FERRITIN 8 (L) 02/14/2024 2144   FERRITIN 45 12/06/2019 1507   IRONPCTSAT 8 (L) 02/14/2024 2144   IRONPCTSAT 9 (LL) 12/06/2019 1507     No results found.   ASSESSMENT & PLAN:   Symptomatic anemia # [2020] History of iron  deficiency anemia secondary to chronic GI bleed/cirrhosis; GAVE [EGD in G+FEB 2024].  FEB 2024- Iron  sat 9 Ferritin 45- wnl-PCP; Hb 9-10.  s/p IV iron  x4 [JAN 2024]- MM panel-WNL.   # April 2025- Hmg  5.1. Ferritin 8, Iron  sat 8%. S/p hospitalization and transfusions.   # Today- hmg 9. Plan for venofer  200 mg x 5. Hold off on transfusion tomorrow. Recheck labs in 2 months.    # Etiology: Cirrhosis/GI bleed FEB 2024- GAVE -non-bleeding-; colo-? Capsule  study.  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 Given recent drop in hemoglobin and hospitalization she underwent colonoscopy, endoscopy, and capsule (not resulted) with Dr. Ole Berkeley which showed multiple angiodysplastic lesions, treated with APC, 3 small polyps removed, non-bleeding internal hemorrhoids, esophagitis. Pathology of esophagus was consistent with reflux. Descending colon polyps were tubular adenomas without high grade dysplasia. Sigmoid colon polyp was hyperplastic.    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 Stable.    # Liver cirrhosis, splenomegaly, small volume ascites.  Patient does not drink alcohol. NAFLD. Followed by GI. 10/2023 US  - cirrhosis, retroperitoneal LNs. January 2025 MRI Abdomen w/wo contrast- chronic liver disease with micronodular liver with fibrosis, ascites, left sided varices. No aggressive appearing masses. Prominent upper abdominal retroperitoneal nodes similar to 2024 MRI. AFP 12/29/22 was 2.5/stable. Defer to GI for HCC screening.   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 More complex lesion along mid right kidney is stable. Surveillance in 6 months recommended. Defer to GI.    # Weight loss sec to oezmpic [40 pounds]- stable. Low concerns for malignancy at this time.    # DISPOSITION: Iron  today Cancel blood tomorrow 1 week- venofer  2 weeks- venofer  3 weeks- venofer  4 weeks- venofer  8 weeks- lab, see me or Dr Valentine Gasmen, +/- venofer - la  No problem-specific Assessment & Plan notes found for this encounter.  All questions were answered. The patient knows to call the clinic with any problems, questions or concerns.  Nelda Balsam, NP 02/21/2024

## 2024-02-21 NOTE — Patient Instructions (Signed)
 Iron Sucrose Injection What is this medication? IRON SUCROSE (EYE ern SOO krose) treats low levels of iron (iron deficiency anemia) in people with kidney disease. Iron is a mineral that plays an important role in making red blood cells, which carry oxygen from your lungs to the rest of your body. This medicine may be used for other purposes; ask your health care provider or pharmacist if you have questions. COMMON BRAND NAME(S): Venofer What should I tell my care team before I take this medication? They need to know if you have any of these conditions: Anemia not caused by low iron levels Heart disease High levels of iron in the blood Kidney disease Liver disease An unusual or allergic reaction to iron, other medications, foods, dyes, or preservatives Pregnant or trying to get pregnant Breastfeeding How should I use this medication? This medication is for infusion into a vein. It is given in a hospital or clinic setting. Talk to your care team about the use of this medication in children. While this medication may be prescribed for children as young as 2 years for selected conditions, precautions do apply. Overdosage: If you think you have taken too much of this medicine contact a poison control center or emergency room at once. NOTE: This medicine is only for you. Do not share this medicine with others. What if I miss a dose? Keep appointments for follow-up doses. It is important not to miss your dose. Call your care team if you are unable to keep an appointment. What may interact with this medication? Do not take this medication with any of the following: Deferoxamine Dimercaprol Other iron products This medication may also interact with the following: Chloramphenicol Deferasirox This list may not describe all possible interactions. Give your health care provider a list of all the medicines, herbs, non-prescription drugs, or dietary supplements you use. Also tell them if you smoke,  drink alcohol, or use illegal drugs. Some items may interact with your medicine. What should I watch for while using this medication? Visit your care team regularly. Tell your care team if your symptoms do not start to get better or if they get worse. You may need blood work done while you are taking this medication. You may need to follow a special diet. Talk to your care team. Foods that contain iron include: whole grains/cereals, dried fruits, beans, or peas, leafy green vegetables, and organ meats (liver, kidney). What side effects may I notice from receiving this medication? Side effects that you should report to your care team as soon as possible: Allergic reactions--skin rash, itching, hives, swelling of the face, lips, tongue, or throat Low blood pressure--dizziness, feeling faint or lightheaded, blurry vision Shortness of breath Side effects that usually do not require medical attention (report to your care team if they continue or are bothersome): Flushing Headache Joint pain Muscle pain Nausea Pain, redness, or irritation at injection site This list may not describe all possible side effects. Call your doctor for medical advice about side effects. You may report side effects to FDA at 1-800-FDA-1088. Where should I keep my medication? This medication is given in a hospital or clinic. It will not be stored at home. NOTE: This sheet is a summary. It may not cover all possible information. If you have questions about this medicine, talk to your doctor, pharmacist, or health care provider.  2024 Elsevier/Gold Standard (2023-03-26 00:00:00)

## 2024-02-22 ENCOUNTER — Inpatient Hospital Stay

## 2024-02-23 NOTE — Progress Notes (Unsigned)
 PROVIDER NOTE: Interpretation of information contained herein should be left to medically-trained personnel. Specific patient instructions are provided elsewhere under "Patient Instructions" section of medical record. This document was created in part using AI and STT-dictation technology, any transcriptional errors that may result from this process are unintentional.  Patient: Margaret [name]  Service: E/M   PCP: Margaret Cunning, MD  DOB: Feb 28, 1956  DOS: 02/24/2024  Provider: Cherylin Corrigan, NP  MRN: [id number]  Delivery: Face-to-face  Specialty: Interventional Pain Management  Type: Established Patient  Setting: Ambulatory outpatient facility  Specialty designation: 09  Referring Prov.: Margaret Cunning, MD  Location: Outpatient office facility       HPI  [name], a 68 y.o. year old female, is here today because of her No primary diagnosis found.. Margaret [name] primary complain today is No chief complaint on file.   Pain Assessment: Severity of   is reported as a  /10. Location:    / . Onset:  . Quality:  . Timing:  . Modifying factor(s):  Margaret [name] Vitals:  vitals were not taken for this visit.  BMI: Estimated body mass index is 34.43 kg/m as calculated from the following:   Height as of 02/14/24: 5' (1.524 m).   Weight as of 02/21/24: 176 lb 4.8 oz (80 kg). Last encounter: 11/30/2023.  Reason for encounter: medication management.  The patient reports doing well on the current medication regimen, with no adverse reactions or side effects reported to medication. Pharmacotherapy Assessment  Analgesic: Hydrocodone -acetaminophen  (Norco) 10-325 mg every 8 hours as needed for severe pain. MME=30 Monitoring: [place] PMP: PDMP reviewed during this encounter.       Pharmacotherapy: No side-effects or adverse reactions reported. Compliance: No problems identified. Effectiveness: Clinically acceptable.  No notes on file  No results found for: "CBDTHCR" No results found for: "D8THCCBX" No results  found for: "D9THCCBX"  UDS:  Summary  Date Value Ref Range Status  06/01/2023 Note  Final    Comment:    ==================================================================== [employer] (MW) ==================================================================== Test                             Result       Flag       Units  Drug Present and Declared for Prescription Verification   Hydrocodone                     5552         EXPECTED   ng/mg creat   Hydromorphone                  124          EXPECTED   ng/mg creat   Dihydrocodeine                 554          EXPECTED   ng/mg creat   Norhydrocodone                 2725         EXPECTED   ng/mg creat    Sources of hydrocodone  include scheduled prescription medications.    Hydromorphone, dihydrocodeine and norhydrocodone are expected    metabolites of hydrocodone . Hydromorphone and dihydrocodeine are    also available as scheduled prescription medications.  ==================================================================== Test  Result    Flag   Units      Ref Range   Creatinine              71               mg/dL      >=40 ==================================================================== Declared Medications:  The flagging and interpretation on this report are based on the  following declared medications.  Unexpected results may arise from  inaccuracies in the declared medications.   **Note: The testing scope of this panel includes these medications:   Hydrocodone  (Norco)   **Note: The testing scope of this panel does not include the  following reported medications:   Acetaminophen  (Norco)  Alendronate  Amlodipine   Aripiprazole  (Abilify )  Benztropine  (Cogentin )  Escitalopram  (Lexapro )  Furosemide (Lasix)  Glimepiride  Hydroxyzine   Meclizine (Antivert)  Metformin  Pantoprazole  (Protonix )  Polyethylene Glycol (MiraLAX)  Rifaximin  (Xifaxan )  Semaglutide (Ozempic)  Spironolactone   Tizanidine ==================================================================== For clinical consultation, please call [phone]. ====================================================================       ROS  Constitutional: Denies any fever or chills Gastrointestinal: No reported hemesis, hematochezia, vomiting, or acute GI distress Musculoskeletal: Denies any acute onset joint swelling, redness, loss of ROM, or weakness Neurological: No reported episodes of acute onset apraxia, aphasia, dysarthria, agnosia, amnesia, paralysis, loss of coordination, or loss of consciousness  Medication Review  ARIPiprazole , HYDROcodone -acetaminophen , Semaglutide (1 MG/DOSE), amLODipine , benztropine , escitalopram , furosemide, lamoTRIgine , metFORMIN, pantoprazole , polyethylene glycol powder, rifaximin , spironolactone, and tizanidine  History Review  Allergy: Margaret [name] is allergic to lithium, penicillins, sulfa antibiotics, and benzonatate. Drug: Margaret [name]  reports that she does not currently use drugs. Alcohol:  reports current alcohol use. Tobacco:  reports that she quit smoking about 12 years ago. Her smoking use included cigarettes. She has never used smokeless tobacco. Social: Margaret [name]  reports that she quit smoking about 12 years ago. Her smoking use included cigarettes. She has never used smokeless tobacco. She reports current alcohol use. She reports that she does not currently use drugs. Medical:  has a past medical history of (HFpEF) heart failure with preserved ejection fraction (HCC), Anxiety, Aortic atherosclerosis (HCC), Aortic stenosis, Bilateral carotid artery disease (HCC), Bilateral renal cysts, Bipolar 1 disorder (HCC), CAD (coronary artery disease) (11/04/2022), Cardiac murmur, CKD (chronic kidney disease), stage III (HCC), DDD (degenerative disc disease), lumbar, Depression, DM (diabetes mellitus), type 2 (HCC), Former smoker, Frequent falls, GERD (gastroesophageal reflux  disease), H/O: GI bleed, Heart murmur, History of blood transfusion, History of MRSA infection (2017), HLD (hyperlipidemia), Hypertension, IDA (iron  deficiency anemia), 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H [facility]), Multiple pulmonary nodules, Nephrolithiasis, Occasional tremors, Osteoarthritis, Osteoporosis, Portal hypertensive gastropathy (HCC), Portal venous hypertension (HCC), Renal mass, 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 Surgical: Ms. Mirkin  has a past surgical history that includes Abdominal hysterectomy; Tonsillectomy; Cholecystectomy; Esophagogastroduodenoscopy (egd) with propofol  (N/A, 06/23/2019); Colonoscopy with propofol  (N/A, 06/23/2019); Esophagogastroduodenoscopy (egd) with propofol  (N/A, 12/08/2022); Colonoscopy with propofol  (N/A, 12/08/2022); Esophagogastroduodenoscopy (egd) with propofol  (N/A, 01/12/2023); Colonoscopy with propofol  (N/A, 05/18/2023); polypectomy (05/18/2023); IR Radiologist Eval & Mgmt (10/28/2023); Total hip arthroplasty (Right, 2017); IR Radiologist Eval & Mgmt (01/18/2024); Esophagogastroduodenoscopy (N/A, 02/15/2024); Colonoscopy (N/A, 02/15/2024); Colonoscopy (02/15/2024); Givens capsule study (02/15/2024); Polypectomy (02/15/2024); and Hot hemostasis (02/15/2024). Family: family history includes Bipolar disorder in her brother, paternal uncle, and paternal uncle; Heart attack (age of onset: 25) in her father; Lung cancer in her mother; Mental illness in her father.  Laboratory Chemistry  Profile   Renal Lab Results  Component Value Date   BUN 15 02/15/2024   CREATININE 0.95 02/15/2024   BCR 19 12/06/2019   GFRAA 114 12/06/2019   GFRNONAA >60 02/15/2024    Hepatic Lab Results  Component Value Date   AST 45 (H) 02/15/2024   ALT 24 02/15/2024   ALBUMIN  3.4 (L) 02/15/2024   ALKPHOS 83 02/15/2024    Electrolytes Lab Results  Component Value  Date   NA 138 02/15/2024   K 4.6 02/15/2024   CL 110 02/15/2024   CALCIUM 8.9 02/15/2024    Bone No results found for: "VD25OH", "VD125OH2TOT", "ZO1096EA5", "WU9811BJ4", "25OHVITD1", "25OHVITD2", "25OHVITD3", "TESTOFREE", "TESTOSTERONE"  Inflammation (CRP: Acute Phase) (ESR: Chronic Phase) No results found for: "CRP", "ESRSEDRATE", "LATICACIDVEN"       Note: Above Lab results reviewed.  Recent Imaging Review  IR Radiologist Eval & Mgmt EXAM: NEW PATIENT OFFICE VISIT  CHIEF COMPLAINT: SEE NOTE IN EPIC  HISTORY OF PRESENT ILLNESS: SEE NOTE IN EPIC  REVIEW OF SYSTEMS: SEE NOTE IN EPIC  PHYSICAL EXAMINATION: SEE NOTE IN EPIC  ASSESSMENT AND PLAN: SEE NOTE IN EPIC  Electronically Signed   By: Fernando Hoyer M.D.   On: 01/18/2024 15:10 Note: Reviewed        Physical Exam  General appearance: Well nourished, well developed, and well hydrated. In no apparent acute distress Mental status: Alert, oriented x 3 (person, place, & time)       Respiratory: No evidence of acute respiratory distress Eyes: PERLA Vitals: There were no vitals taken for this visit. BMI: Estimated body mass index is 34.43 kg/m as calculated from the following:   Height as of 02/14/24: 5' (1.524 m).   Weight as of 02/21/24: 176 lb 4.8 oz (80 kg). Ideal: Ideal body weight: 45.5 kg (100 lb 4.9 oz) Adjusted ideal body weight: 59.3 kg (130 lb 11.3 oz)  Assessment   Diagnosis Status  1. Encounter for long-term opiate analgesic use   2. History of left hip replacement (2017)   3. Lumbar facet arthropathy   4. Chronic radicular lumbar pain   5. Chronic pain syndrome    Controlled Controlled Controlled   Plan of Care  Assessment and Plan We will continue the current medication regimen, as the patient reports adequate symptoms control and no adverse effects in the management of chronic pain syndrome.  Prescribing drug monitoring (PDMP) review is consistent with the current medication  regimen.  Pharmacotherapy (Medications Ordered): No orders of the defined types were placed in this encounter.  Orders:  No orders of the defined types were placed in this encounter.  Follow-up plan:   No follow-ups on file.    Recent Visits Date Type Provider Dept  11/30/23 Office Visit Cephus Collin, MD [facility]  Showing recent visits within past 90 days and meeting all other requirements Future Appointments Date Type Provider Dept  02/24/24 Appointment Cephus Collin, MD [facility]  Showing future appointments within next 90 days and meeting all other requirements  I discussed the assessment and treatment plan with the patient. The patient was provided an opportunity to ask questions and all were answered. The patient agreed with the plan and demonstrated an understanding of the instructions.  Patient advised to call back or seek an in-person evaluation if the symptoms or condition worsens.  Duration of encounter: *** minutes.  Total time on encounter, as per AMA guidelines included both the face-to-face and non-face-to-face time personally spent by the physician and/or  other qualified health care professional(s) on the day of the encounter (includes time in activities that require the physician or other qualified health care professional and does not include time in activities normally performed by clinical staff). Physician's time may include the following activities when performed: Preparing to see the patient (e.g., pre-charting review of records, searching for previously ordered imaging, lab work, and nerve conduction tests) Review of prior analgesic pharmacotherapies. Reviewing PMP Interpreting ordered tests (e.g., lab work, imaging, nerve conduction tests) Performing post-procedure evaluations, including interpretation of diagnostic procedures Obtaining and/or reviewing separately obtained history Performing a medically appropriate examination and/or  evaluation Counseling and educating the patient/family/caregiver Ordering medications, tests, or procedures Referring and communicating with other health care professionals (when not separately reported) Documenting clinical information in the electronic or other health record Independently interpreting results (not separately reported) and communicating results to the patient/ family/caregiver Care coordination (not separately reported)  Note by: Ronnie Doo K Gailene Youkhana, NP (TTS and AI technology used. I apologize for any typographical errors that were not detected and corrected.) Date: 02/24/2024; Time: 12:03 PM

## 2024-02-24 ENCOUNTER — Encounter: Payer: Self-pay | Admitting: Student in an Organized Health Care Education/Training Program

## 2024-02-24 ENCOUNTER — Ambulatory Visit
Payer: 59 | Attending: Student in an Organized Health Care Education/Training Program | Admitting: Student in an Organized Health Care Education/Training Program

## 2024-02-24 VITALS — BP 129/42 | HR 71 | Temp 97.2°F | Resp 16 | Ht 60.0 in | Wt 176.0 lb

## 2024-02-24 DIAGNOSIS — M47816 Spondylosis without myelopathy or radiculopathy, lumbar region: Secondary | ICD-10-CM | POA: Diagnosis not present

## 2024-02-24 DIAGNOSIS — M5416 Radiculopathy, lumbar region: Secondary | ICD-10-CM | POA: Insufficient documentation

## 2024-02-24 DIAGNOSIS — M25552 Pain in left hip: Secondary | ICD-10-CM | POA: Diagnosis present

## 2024-02-24 DIAGNOSIS — G894 Chronic pain syndrome: Secondary | ICD-10-CM | POA: Insufficient documentation

## 2024-02-24 DIAGNOSIS — Z96642 Presence of left artificial hip joint: Secondary | ICD-10-CM | POA: Diagnosis present

## 2024-02-24 DIAGNOSIS — G8929 Other chronic pain: Secondary | ICD-10-CM | POA: Insufficient documentation

## 2024-02-24 DIAGNOSIS — Z79891 Long term (current) use of opiate analgesic: Secondary | ICD-10-CM | POA: Insufficient documentation

## 2024-02-24 DIAGNOSIS — M25562 Pain in left knee: Secondary | ICD-10-CM | POA: Diagnosis present

## 2024-02-24 MED ORDER — HYDROCODONE-ACETAMINOPHEN 10-325 MG PO TABS: 1.0000 | ORAL_TABLET | Freq: Three times a day (TID) | ORAL | 0 refills | Status: DC | PRN

## 2024-02-24 MED ORDER — HYDROCODONE-ACETAMINOPHEN 10-325 MG PO TABS: 1.0000 | ORAL_TABLET | Freq: Three times a day (TID) | ORAL | 0 refills | Status: AC | PRN

## 2024-02-24 NOTE — Progress Notes (Signed)
 Nursing Pain Medication Assessment:  Safety precautions to be maintained throughout the outpatient stay will include: orient to surroundings, keep bed in low position, maintain call bell within reach at all times, provide assistance with transfer out of bed and ambulation.   Medication Inspection Compliance: Pill count conducted under aseptic conditions, in front of the patient. Neither the pills nor the bottle was removed from the patient's sight at any time. Once count was completed pills were immediately returned to the patient in their original bottle.  Medication: Hydrocodone /APAP Pill/Patch Count:  22 of 90 pills remain  Pill/Patch Appearance: Markings consistent with prescribed medication Bottle Appearance: Standard pharmacy container. Clearly labeled. Filled Date: 04 / 02 / 2025 Last Medication intake:  Today

## 2024-02-24 NOTE — Patient Instructions (Signed)

## 2024-02-28 ENCOUNTER — Inpatient Hospital Stay

## 2024-02-28 VITALS — BP 136/46 | HR 68 | Temp 97.1°F | Resp 16

## 2024-02-28 DIAGNOSIS — D500 Iron deficiency anemia secondary to blood loss (chronic): Secondary | ICD-10-CM | POA: Diagnosis not present

## 2024-02-28 MED ORDER — SODIUM CHLORIDE 0.9% FLUSH
10.0000 mL | Freq: Once | INTRAVENOUS | Status: AC | PRN
  Administered 2024-02-28: 10 mL
  Filled 2024-02-28: qty 10

## 2024-02-28 MED ORDER — IRON SUCROSE 20 MG/ML IV SOLN
200.0000 mg | Freq: Once | INTRAVENOUS | Status: AC
  Administered 2024-02-28: 200 mg via INTRAVENOUS
  Filled 2024-02-28: qty 10

## 2024-02-28 NOTE — Patient Instructions (Signed)

## 2024-02-28 NOTE — Progress Notes (Signed)
Patient tolerated Venofer infusion well. Explained recommendation of 30 min post monitoring. Patient refused to wait post monitoring. Educated on what signs to watch for & to call with any concerns. No question, discharged. Stable  

## 2024-03-02 ENCOUNTER — Ambulatory Visit: Admitting: Psychiatry

## 2024-03-06 ENCOUNTER — Inpatient Hospital Stay: Attending: Internal Medicine

## 2024-03-06 VITALS — BP 118/45 | HR 71 | Temp 98.9°F

## 2024-03-06 DIAGNOSIS — K922 Gastrointestinal hemorrhage, unspecified: Secondary | ICD-10-CM | POA: Diagnosis present

## 2024-03-06 DIAGNOSIS — D500 Iron deficiency anemia secondary to blood loss (chronic): Secondary | ICD-10-CM | POA: Insufficient documentation

## 2024-03-06 MED ORDER — SODIUM CHLORIDE 0.9% FLUSH
10.0000 mL | Freq: Once | INTRAVENOUS | Status: AC | PRN
  Administered 2024-03-06: 10 mL
  Filled 2024-03-06: qty 10

## 2024-03-06 MED ORDER — IRON SUCROSE 20 MG/ML IV SOLN
200.0000 mg | Freq: Once | INTRAVENOUS | Status: AC
  Administered 2024-03-06: 200 mg via INTRAVENOUS

## 2024-03-07 ENCOUNTER — Telehealth: Payer: Self-pay | Admitting: Psychiatry

## 2024-03-07 ENCOUNTER — Telehealth (INDEPENDENT_AMBULATORY_CARE_PROVIDER_SITE_OTHER): Admitting: Psychiatry

## 2024-03-07 ENCOUNTER — Encounter: Payer: Self-pay | Admitting: Psychiatry

## 2024-03-07 DIAGNOSIS — F3178 Bipolar disorder, in full remission, most recent episode mixed: Secondary | ICD-10-CM | POA: Diagnosis not present

## 2024-03-07 DIAGNOSIS — F418 Other specified anxiety disorders: Secondary | ICD-10-CM

## 2024-03-07 NOTE — Progress Notes (Signed)
 Virtual Visit via Video Note  I connected with Margaret [name] on 03/07/24 at  9:00 AM EDT by a video enabled telemedicine application and verified that I am speaking with the correct person using two identifiers.  Location Provider Location : [facility] Patient Location : Home  Participants: Patient , Provider   I discussed the limitations of evaluation and management by telemedicine and the availability of in person appointments. The patient expressed understanding and agreed to proceed.   I discussed the assessment and treatment plan with the patient. The patient was provided an opportunity to ask questions and all were answered. The patient agreed with the plan and demonstrated an understanding of the instructions.   The patient was advised to call back or seek an in-person evaluation if the symptoms worsen or if the condition fails to improve as anticipated.   BH MD OP Progress Note  03/07/2024 9:27 AM Margaret [name]  MRN:  [id number]  Chief Complaint:  Chief Complaint  Patient presents with   Follow-up   Depression   Medication Refill   Anxiety   Discussed the use of AI scribe software for clinical note transcription with the patient, who gave verbal consent to proceed.  History of Present Illness Margaret [name] is a 68 year old Caucasian female who lives in [place], has a history of bipolar disorder, other specified anxiety disorder, hyperlipidemia, liver cirrhosis, right kidney complex cyst, thrombocytopenia, chronic pain was evaluated by telemedicine today.  She recently experienced severe anemia with a hemoglobin level of 5.1 ( 02/14/24), requiring a two-day hospitalization for iron  infusions. Post-treatment, her energy levels have improved. She is currently on a regimen of iron  infusions every two weeks for a month, followed by a two-week break.  Her bipolar disorder and anxiety disorder are managed with Lamictal , which was recently increased to 225 mg, resulting  in significant improvement in mood symptoms. She no longer experiences crying spells, fatigue, or anxiety. She continues to take Abilify  and Lexapro  without any changes.  She reports anxiety is manageable as well.  She occasionally experiences restless legs and uses tizanidine sparingly for relief. She also takes vitamin B12 , which she feels have contributed to her increased energy levels, although her B12 levels were noted to be high recently.  She discontinued benztropine  after suspecting it caused tremors, which have since resolved.   Reviewed most recent CBC with differential dated 02/21/2024-her platelet count is currently low, and she is in communication with her healthcare providers regarding this.  She continues to follow up with hematology/oncology.  She reports sleeping well, with occasional interruptions for bathroom use. No significant anxiety, jitteriness, or nervousness. She is able to perform cognitive tasks such as recalling words and performing simple arithmetic, indicating stable cognitive function.  She denies any other concerns today.    Visit Diagnosis:    ICD-10-CM   1. Bipolar disorder, in full remission, most recent episode mixed (HCC)  F31.78    Type I most recent episode mixed, mild    2. Other specified anxiety disorders  F41.8    With limited symptom attacks      Past Psychiatric History: I have reviewed past psychiatric history from progress note on 08/21/2019.  Past trials of Depakote, Xanax .  Past Medical History:  Past Medical History:  Diagnosis Date   (HFpEF) heart failure with preserved ejection fraction (HCC)    a.) TTE 12/15/2022: EF >55%, mild LVH, G1DD, mild LAE, mild MAC, triv AR/MR/TR, mild TR, RVSP 30.7, mod AS (  MPG 20)   Anxiety    Aortic atherosclerosis (HCC)    Aortic stenosis    a.) TTE 12/15/2022: moderate AS (MPG 20 mmHg; AVA = 1.7 cm2)   Bilateral carotid artery disease (HCC)    a.) doppler 12/22/2020: <50% BICA   Bilateral renal  cysts    Bipolar 1 disorder (HCC)    CAD (coronary artery disease) 11/04/2022   a.) CT chest 11/04/2022: 3v CAD   Cardiac murmur    CKD (chronic kidney disease), stage III (HCC)    DDD (degenerative disc disease), lumbar    Depression    DM (diabetes mellitus), type 2 (HCC)    Former smoker    Frequent falls    GERD (gastroesophageal reflux disease)    H/O: GI bleed    Heart murmur    History of blood transfusion    History of MRSA infection 2017   HLD (hyperlipidemia)    Hypertension    IDA (iron  deficiency anemia)    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H [facility])    Multiple pulmonary nodules    Nephrolithiasis    Occasional tremors    Osteoarthritis    Osteoporosis    Portal hypertensive gastropathy (HCC)    Portal venous hypertension (HCC)    Renal mass, right 03/30/2023   a.) MRI ABD 03/30/2023: 1.2 x 1.2 cm complex cystic renal cortical mass in the posterior mid to low RIGHT kidney (Bosniak 3); b.) MRI ABD 08/03/2023: interval increase in size to 1.4 x 1.3 cm -> concerning for RCC; c.) US  ABD 10/12/2023: 1.7 complex cystic mass RIGHT kidney; d.) MRI ABD 11/15/2023: interval increase in size to 1.6 x 1.2 x 1.0 cm   Splenomegaly    Thoracic spondylosis    Thrombocytopenia (HCC)    Tubular adenoma of colon    Uses walker    Venous insufficiency    Ventral hernia     Past Surgical History:  Procedure Laterality Date   ABDOMINAL HYSTERECTOMY     CHOLECYSTECTOMY     COLONOSCOPY N/A 02/15/2024   Procedure: COLONOSCOPY;  Surgeon: [name], MD;  Location: [facility] ENDOSCOPY;  Service: Endoscopy;  Laterality: N/A;   COLONOSCOPY  02/15/2024   Procedure: COLONOSCOPY, WITH ARGON PLASMA COAGULATION;  Surgeon: [name], MD;  Location: [facility] ENDOSCOPY;  Service: Endoscopy;;   COLONOSCOPY WITH PROPOFOL  N/A 06/23/2019   Procedure: COLONOSCOPY WITH PROPOFOL ;  Surgeon: Luke Salaam, MD;  Location[facility] ENDOSCOPY;  Service: Gastroenterology;  Laterality: N/A;   COLONOSCOPY WITH PROPOFOL  N/A  12/08/2022   Procedure: COLONOSCOPY WITH PROPOFOL ;  Surgeon: Shane Darling, MD;  Location: [facility] ENDOSCOPY;  Service: Endoscopy;  Laterality: N/A;   COLONOSCOPY WITH PROPOFOL  N/A 05/18/2023   Procedure: COLONOSCOPY WITH PROPOFOL ;  Surgeon: Shane Darling, MD;  Location: [facility] ENDOSCOPY;  Service: Endoscopy;  Laterality: N/A;   ESOPHAGOGASTRODUODENOSCOPY N/A 02/15/2024   Procedure: EGD (ESOPHAGOGASTRODUODENOSCOPY);  Surgeon: [name], MD;  Location: [facility] ENDOSCOPY;  Service: Endoscopy;  Laterality: N/A;   ESOPHAGOGASTRODUODENOSCOPY (EGD) WITH PROPOFOL  N/A 06/23/2019   Procedure: ESOPHAGOGASTRODUODENOSCOPY (EGD) WITH PROPOFOL ;  Surgeon: Luke Salaam, MD;  Location: [facility] ENDOSCOPY;  Service: Gastroenterology;  Laterality: N/A;   ESOPHAGOGASTRODUODENOSCOPY (EGD) WITH PROPOFOL  N/A 12/08/2022   Procedure: ESOPHAGOGASTRODUODENOSCOPY (EGD) WITH PROPOFOL ;  Surgeon: Shane Darling, MD;  Location: [facility] ENDOSCOPY;  Service: Endoscopy;  Laterality: N/A;  DM, OZEMPIC   ESOPHAGOGASTRODUODENOSCOPY (EGD) WITH PROPOFOL  N/A 01/12/2023   Procedure: ESOPHAGOGASTRODUODENOSCOPY (EGD) WITH PROPOFOL ;  Surgeon: Shane Darling, MD;  Location: [facility] ENDOSCOPY;  Service: Endoscopy;  Laterality:  N/A;  DM   GIVENS CAPSULE STUDY  02/15/2024   Procedure: IMAGING PROCEDURE, GI TRACT, INTRALUMINAL, VIA CAPSULE;  Surgeon: [name], MD;  Location: [facility] ENDOSCOPY;  Service: Endoscopy;;   HOT HEMOSTASIS  02/15/2024   Procedure: EGD, WITH ARGON PLASMA COAGULATION;  Surgeon: [name], MD;  Location: [facility] ENDOSCOPY;  Service: Endoscopy;;   IR RADIOLOGIST EVAL & MGMT  10/28/2023   IR RADIOLOGIST EVAL & MGMT  01/18/2024   POLYPECTOMY  05/18/2023   Procedure: POLYPECTOMY;  Surgeon: Shane Darling, MD;  Location: [facility] ENDOSCOPY;  Service: Endoscopy;;   POLYPECTOMY  02/15/2024   Procedure: POLYPECTOMY, INTESTINE;  Surgeon: [name], MD;  Location: [facility] ENDOSCOPY;  Service: Endoscopy;;   TONSILLECTOMY      TOTAL HIP ARTHROPLASTY Right 2017    Family Psychiatric History: I have reviewed family psychiatric history from progress note on 08/21/2019.  Family History:  Family History  Problem Relation Age of Onset   Lung cancer Mother    Heart attack Father 31   Mental illness Father    Bipolar disorder Brother    Bipolar disorder Paternal Uncle    Bipolar disorder Paternal Uncle     Social History: I have reviewed social history from progress note on 08/21/2019. Social History   Socioeconomic History   Marital status: Divorced    Spouse name: Not on file   Number of children: 2   Years of education: Not on file   Highest education level: Not on file  Occupational History   Not on file  Tobacco Use   Smoking status: Former    Current packs/day: 0.00    Types: Cigarettes    Quit date: 03/03/2011    Years since quitting: 13.0   Smokeless tobacco: Never  Vaping Use   Vaping status: Never Used  Substance and Sexual Activity   Alcohol use: Yes    Comment: rarely   Drug use: Not Currently   Sexual activity: Not Currently  Other Topics Concern   Not on file  Social History Narrative   Not on file   Social Drivers of Health   Financial Resource Strain: Low Risk  (06/29/2023)   Received from [facility]   Overall Financial Resource Strain (CARDIA)    Difficulty of Paying Living Expenses: Not hard at all  Food Insecurity: No Food Insecurity (02/14/2024)   Hunger Vital Sign    Worried About Running Out of Food in the Last Year: Never true    Ran Out of Food in the Last Year: Never true  Transportation Needs: No Transportation Needs (02/14/2024)   PRAPARE - Administrator, Civil Service (Medical): No    Lack of Transportation (Non-Medical): No  Physical Activity: Inactive (06/16/2021)   [facility]   Exercise Vital Sign  Stress: No Stress Concern Present (06/16/2021)   Received from [facility] Occupational Health - Occupational Stress Questionnaire  Social Connections: Moderately Integrated (02/14/2024)   Social Connection and Isolation Panel [NHANES]    Frequency of Communication with Friends and Family: More than three times a week    Frequency of Social Gatherings with Friends and Family: More than three times a week    Attends Religious Services: More than 4 times per year    Active Member of [employer] or Organizations: Yes    Attends Engineer, structural: More than 4 times per year    Marital Status: Divorced  Allergies:  Allergies  Allergen Reactions   Lithium Nausea And Vomiting and Other (See Comments)   Penicillins Hives    Resulted in hospitalization   Sulfa Antibiotics Hives    Resulted in hospitalization   Benzonatate Other (See Comments)    Metabolic Disorder Labs: Lab Results  Component Value Date   HGBA1C 7.3 (H) 06/22/2019   MPG 162.81 06/22/2019   No results found for: "PROLACTIN" No results found for: "CHOL", "TRIG", "HDL", "CHOLHDL", "VLDL", "LDLCALC" Lab Results  Component Value Date   TSH 1.463 12/29/2022   TSH 1.737 11/16/2019    Therapeutic Level Labs: No results found for: "LITHIUM" No results found for: "VALPROATE" No results found for: "CBMZ"  Current Medications: Current Outpatient Medications  Medication Sig Dispense Refill   amLODipine  (NORVASC ) 5 MG tablet Take 5 mg by mouth daily.     ARIPiprazole  (ABILIFY ) 10 MG tablet Take 1 tablet (10 mg total) by mouth daily. 90 tablet 0   escitalopram  (LEXAPRO ) 10 MG tablet TAKE ONE TABLET BY MOUTH ONE TIME DAILY (Patient taking differently: Take 10 mg by mouth at bedtime.) 90 tablet 3   furosemide (LASIX) 20 MG tablet Take 20 mg by mouth daily as needed for fluid or edema.     glimepiride (AMARYL) 2 MG tablet Take 2 mg by mouth every morning.     HYDROcodone -acetaminophen  (NORCO) 10-325 MG tablet Take 1 tablet by mouth every 8 (eight) hours as needed for severe  pain (pain score 7-10). Must last 30 days. 90 tablet 0   [START ON 04/02/2024] HYDROcodone -acetaminophen  (NORCO) 10-325 MG tablet Take 1 tablet by mouth every 8 (eight) hours as needed for severe pain (pain score 7-10). Must last 30 days. 90 tablet 0   [START ON 05/02/2024] HYDROcodone -acetaminophen  (NORCO) 10-325 MG tablet Take 1 tablet by mouth every 8 (eight) hours as needed for severe pain (pain score 7-10). Must last 30 days. 90 tablet 0   lamoTRIgine  (LAMICTAL ) 200 MG tablet TAKE ONE TABLET BY MOUTH ONE TIME DAILY (Patient taking differently: Take 225 mg by mouth daily.) 90 tablet 3   lamoTRIgine  (LAMICTAL ) 25 MG tablet Take 1 tablet (25 mg total) by mouth daily. Take daily with 200 mg , total of 225 mg 90 tablet 0   metFORMIN (GLUCOPHAGE) 500 MG tablet Take 500 mg by mouth 2 (two) times daily with a meal.     OZEMPIC, 1 MG/DOSE, 4 MG/3ML SOPN Inject 1 mg into the skin once a week. Sundays     pantoprazole  (PROTONIX ) 40 MG tablet Take 40 mg by mouth every morning.     polyethylene glycol powder (GLYCOLAX/MIRALAX) 17 GM/SCOOP powder Take 17 g by mouth daily.     spironolactone (ALDACTONE) 25 MG tablet Take 25 mg by mouth daily.     tizanidine (ZANAFLEX) 2 MG capsule Take 2 mg by mouth 3 (three) times daily as needed for muscle spasms. Takes differently     XIFAXAN  550 MG TABS tablet Take 550 mg by mouth 2 (two) times daily.     No current facility-administered medications for this visit.     Musculoskeletal: Strength & Muscle Tone:  UTA Gait & Station:  Seated Patient leans: N/A  Psychiatric Specialty Exam: Review of Systems  Psychiatric/Behavioral: Negative.      There were no vitals taken for this visit.There is no height or weight on file to calculate BMI.  General Appearance: Fairly Groomed  Eye Contact:  Fair  Speech:  Clear and Coherent  Volume:  Normal  Mood:  Euthymic  Affect:  Congruent  Thought Process:  Goal Directed and Descriptions of Associations: Intact  Orientation:   Full (Time, Place, and Person)  Thought Content: Logical   Suicidal Thoughts:  No  Homicidal Thoughts:  No  Memory:  Immediate;   Fair Recent;   Fair Remote;   Fair  Judgement:  Fair  Insight:  Fair  Psychomotor Activity:  Normal  Concentration:  Concentration: Fair and Attention Span: Fair  Recall:  [employer] of Knowledge: Fair  Language: Fair  Akathisia:  No  Handed:  Right  AIMS (if indicated): not done  Assets:  Desire for Improvement Housing Social Support Transportation  ADL's:  Intact  Cognition: WNL  Sleep:  Fair   Screenings: Geneticist, molecular Office Visit from 07/23/2023 in [facility] Video Visit from 03/16/2023 in [facility] Office Visit from 10/13/2022 in [facility] Video Visit from 05/14/2022 in [facility] Video Visit from 02/13/2022 in [facility]  AIMS Total Score 0 0 0 0 0      GAD-7    Flowsheet Row Office Visit from 07/23/2023 in [facility] Office Visit from 10/13/2022 in [facility] Video Visit from 02/13/2022 in [facility]  Total GAD-7 Score 2 2 2       PHQ2-9    Flowsheet Row Office Visit from 02/24/2024 in [facility] Visit from 11/30/2023 in [facility] Visit from 07/23/2023 in [facility] Office Visit from 06/01/2023 in [facility] Video Visit from 03/16/2023 in [facility]  PHQ-2 Total Score 1 1 0 0 0      Flowsheet Row Video Visit from  03/07/2024 in [facility] ED to Hosp-Admission (Discharged) from 02/14/2024 in [facility] 1C MEDICAL TELEMETRY Video Visit from 01/21/2024 in [facility]  C-SSRS RISK CATEGORY No Risk No Risk No Risk        Assessment and Plan: Margaret [name] is a 68 year old Caucasian female who has a history of bipolar disorder, chronic pain was evaluated by telemedicine today, discussed assessment and plan as started below.  Assessment & Plan Bipolar disorder in full remission-most recent episode type I mixed mild Bipolar disorder is well-managed with the current medication regimen. The recent increase in Lamictal  to 225 mg has improved mood symptoms, eliminating crying spells and significant anxiety. Pharmacy issues with the Lamictal  prescription require resolution. - Contact the pharmacy to address  the Lamictal  prescription issue. - Continue Lamictal  225 mg daily - Continue Abilify  10 mg daily - Discontinue Benztropine  for possible side effects.  Anxiety disorder-stable Anxiety symptoms have significantly improved with the current medication regimen. Monitoring of platelet count is necessary due to potential medication effects. - Monitor platelet count and report any changes. - Continue Lexapro  10 mg daily-reduced dosage - Continue Hydroxyzine  25-50 mg at bedtime as needed  Reviewed most recent labs dated 02/21/2024-CBC with differential-platelet count low at 82 or patient is currently under the care of hematology/oncology.  Will monitor closely.  Follow-up Follow-up in clinic in 3 months or sooner in person.    Collaboration of Care: Collaboration  of Care: Other patient encouraged to continue to follow up with primary care provider and hematologist.  Patient/Guardian was advised Release of Information must be obtained prior to any record release in order to collaborate their care with an  outside provider. Patient/Guardian was advised if they have not already done so to contact the registration department to sign all necessary forms in order for us  to release information regarding their care.   Consent: Patient/Guardian gives verbal consent for treatment and assignment of benefits for services provided during this visit. Patient/Guardian expressed understanding and agreed to proceed.  This note was generated in part or whole with voice recognition software. Voice recognition is usually quite accurate but there are transcription errors that can and very often do occur. I apologize for any typographical errors that were not detected and corrected.     Anisah Kuck, MD 03/07/2024, 9:27 AM

## 2024-03-07 NOTE — Telephone Encounter (Signed)
 She is not due for lamictal  at this time then. Please call patient and let her know.

## 2024-03-07 NOTE — Telephone Encounter (Signed)
 left message with information that pharmacy gave and also what dr. Eappen said

## 2024-03-07 NOTE — Telephone Encounter (Signed)
 called pharmacy the lamictal  200mg  was last picked up on 12-30-23 for 90day supply and she has 2 refills left. the lamictal  25mg  was last picked up on 01-24-24 90 day supply and has no refills

## 2024-03-07 NOTE — Telephone Encounter (Signed)
 Patient states pharmacy, [facility], does not have her Lamictal  refills for 200 or the 25 mg.  Our record shows 1 year supply of Lamictal  200 sent out in December and Lamictal  25 mg 90 days supply was sent out in March. Will have staff contact pharmacy to verify.

## 2024-03-13 ENCOUNTER — Telehealth: Payer: Self-pay

## 2024-03-13 ENCOUNTER — Inpatient Hospital Stay

## 2024-03-13 VITALS — BP 134/73 | HR 76 | Resp 16

## 2024-03-13 DIAGNOSIS — F3161 Bipolar disorder, current episode mixed, mild: Secondary | ICD-10-CM

## 2024-03-13 DIAGNOSIS — D500 Iron deficiency anemia secondary to blood loss (chronic): Secondary | ICD-10-CM | POA: Diagnosis not present

## 2024-03-13 DIAGNOSIS — F418 Other specified anxiety disorders: Secondary | ICD-10-CM

## 2024-03-13 MED ORDER — IRON SUCROSE 20 MG/ML IV SOLN
200.0000 mg | Freq: Once | INTRAVENOUS | Status: AC
  Administered 2024-03-13: 200 mg via INTRAVENOUS
  Filled 2024-03-13: qty 10

## 2024-03-13 MED ORDER — LAMOTRIGINE 25 MG PO TABS: 25.0000 mg | ORAL_TABLET | Freq: Every day | ORAL | 0 refills | Status: DC

## 2024-03-13 NOTE — Telephone Encounter (Signed)
I have sent Lamictal to pharmacy. °

## 2024-03-13 NOTE — Telephone Encounter (Signed)
 Received fax from pharmacy requesting a refill of   lamoTRIgine  (LAMICTAL ) 25 MG tablet   Pharmacy  [facility] [address] - [place], [place] - [address] Phone: [phone]  Fax: [phone]

## 2024-03-16 ENCOUNTER — Ambulatory Visit: Admitting: Cardiology

## 2024-03-20 ENCOUNTER — Inpatient Hospital Stay

## 2024-03-20 VITALS — BP 125/51 | HR 70 | Temp 98.8°F | Resp 19

## 2024-03-20 DIAGNOSIS — D500 Iron deficiency anemia secondary to blood loss (chronic): Secondary | ICD-10-CM

## 2024-03-20 MED ORDER — IRON SUCROSE 20 MG/ML IV SOLN
200.0000 mg | Freq: Once | INTRAVENOUS | Status: AC
  Administered 2024-03-20: 200 mg via INTRAVENOUS

## 2024-03-20 MED ORDER — SODIUM CHLORIDE 0.9% FLUSH
10.0000 mL | Freq: Once | INTRAVENOUS | Status: AC | PRN
  Administered 2024-03-20: 10 mL
  Filled 2024-03-20: qty 10

## 2024-03-23 ENCOUNTER — Encounter: Payer: Self-pay | Admitting: Cardiology

## 2024-03-23 ENCOUNTER — Other Ambulatory Visit
Admission: RE | Admit: 2024-03-23 | Discharge: 2024-03-23 | Disposition: A | Discharge status: Home / Self Care | Source: Ambulatory Visit | Attending: Cardiology | Admitting: Cardiology

## 2024-03-23 ENCOUNTER — Ambulatory Visit: Attending: Cardiology | Admitting: Cardiology

## 2024-03-23 VITALS — BP 124/54 | HR 66 | Ht 64.0 in | Wt 174.0 lb

## 2024-03-23 DIAGNOSIS — R079 Chest pain, unspecified: Secondary | ICD-10-CM | POA: Diagnosis not present

## 2024-03-23 DIAGNOSIS — E1169 Type 2 diabetes mellitus with other specified complication: Secondary | ICD-10-CM | POA: Diagnosis not present

## 2024-03-23 DIAGNOSIS — R072 Precordial pain: Secondary | ICD-10-CM | POA: Insufficient documentation

## 2024-03-23 DIAGNOSIS — I10 Essential (primary) hypertension: Secondary | ICD-10-CM | POA: Diagnosis not present

## 2024-03-23 DIAGNOSIS — D649 Anemia, unspecified: Secondary | ICD-10-CM

## 2024-03-23 DIAGNOSIS — E785 Hyperlipidemia, unspecified: Secondary | ICD-10-CM

## 2024-03-23 DIAGNOSIS — I350 Nonrheumatic aortic (valve) stenosis: Secondary | ICD-10-CM | POA: Diagnosis not present

## 2024-03-23 DIAGNOSIS — K920 Hematemesis: Secondary | ICD-10-CM

## 2024-03-23 LAB — TROPONIN I (HIGH SENSITIVITY): Troponin I (High Sensitivity): 6 ng/L (ref <18)

## 2024-03-23 NOTE — Assessment & Plan Note (Addendum)
 Aortic valve stenosis with echocardiogram planned for follow-up. Symptoms not suggestive of acute cardiac event.  - Monitor for symptoms of worsening stenosis.  Follow-up echocardiogram done at [facility] in April 2025-gradients actually improved.

## 2024-03-23 NOTE — Patient Instructions (Signed)
 Medication Instructions:  Your Physician recommend you continue on your current medication as directed.    *If you need a refill on your cardiac medications before your next appointment, please call your pharmacy*  Lab Work: Your provider would like for you to have following labs drawn today BMP, Troponin I (High Sensitivity).   If you have labs (blood work) drawn today and your tests are completely normal, you will receive your results only by: MyChart Message (if you have MyChart) OR A paper copy in the mail If you have any lab test that is abnormal or we need to change your treatment, we will call you to review the results.  Testing/Procedures: Your physician has requested that you have an echocardiogram. Echocardiography is a painless test that uses sound waves to create images of your heart. It provides your doctor with information about the size and shape of your heart and how well your heart's chambers and valves are working.   You may receive an ultrasound enhancing agent through an IV if needed to better visualize your heart during the echo. This procedure takes approximately one hour.  There are no restrictions for this procedure.  This will take place at [address] [facility] [address], [place] [address]  Please note: We ask at that you not bring children with you during ultrasound (echo/ vascular) testing. Due to room size and safety concerns, children are not allowed in the ultrasound rooms during exams. Our front office staff cannot provide observation of children in our lobby area while testing is being conducted. An adult accompanying a patient to their appointment will only be allowed in the ultrasound room at the discretion of the ultrasound technician under special circumstances. We apologize for any inconvenience.   Follow-Up: At [facility], you and your health needs are our priority.  As part of our continuing mission to provide you with  exceptional heart care, our providers are all part of one team.  This team includes your primary Cardiologist (physician) and Advanced Practice Providers or APPs (Physician Assistants and Nurse Practitioners) who all work together to provide you with the care you need, when you need it.  Your next appointment:   4 - 6 week(s) (After the ECHO is completed)  Provider:   Randene Bustard, MD    We recommend signing up for the patient portal called "MyChart".  Sign up information is provided on this After Visit Summary.  MyChart is used to connect with patients for Virtual Visits (Telemedicine).  Patients are able to view lab/test results, encounter notes, upcoming appointments, etc.  Non-urgent messages can be sent to your provider as well.   To learn more about what you can do with MyChart, go to [email or web address].

## 2024-03-23 NOTE — Assessment & Plan Note (Signed)
 Her most recent A1c and LDL are both relatively well-controlled.  Not on any medication for lipids but is on glimepiride 2 mg daily, Glucophage 500 mg twice daily and Ozempic injections.  With her ongoing chest discomfort would like to exclude adverse events before we consider Coronary Calcium Score.

## 2024-03-23 NOTE — Assessment & Plan Note (Addendum)
 Reproducible chest wall pain along both sternal borders in the lower third of the sternum. Intermittent chest pain likely musculoskeletal, possibly costochondritis. Non-cardiac origin suggested by lack of relief from antacids. - Order echocardiogram to evaluate aortic valve and cardiac function. - Perform blood work for cardiac injury markers. - Apply warm compress or lidocaine  patch for symptomatic relief. - Consider Tylenol  for pain management, avoiding NSAIDs.

## 2024-03-23 NOTE — Assessment & Plan Note (Signed)
 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 Likely contributing to gastrointestinal bleeding and anemia. - Continue follow-up with gastroenterology for management of GAVE. - Discuss with gastroenterologist regarding ongoing symptoms and potential further interventions.  Esophagitis identified during endoscopy, likely contributing to gastrointestinal symptoms. - Continue current management with antacids as needed. - Discuss with gastroenterologist for further evaluation and management.  Hiatal hernia Hiatal hernia identified, potentially contributing to gastrointestinal symptoms. - Discuss with gastroenterologist regarding management of hiatal hernia.  Liver disease Liver disease potentially contributing to GAVE and esophageal varices. Managed with Xifaxan . - Continue Xifaxan  for liver disease management. - Monitor liver function tests and symptoms related to liver disease.

## 2024-03-23 NOTE — Telephone Encounter (Signed)
 I do not see that echo. Yes we do not need to repeat echo.  DOPPLER ECHO and OTHER SPECIAL PROCEDURES                 Aortic: TRIVIAL AR                 MODERATE AS                         298.0 cm/sec peak vel      35.5 mmHg peak grad                         20.0 mmHg mean grad        1.7 cm^2 by DOPPLER                 Mitral: TRIVIAL MR                 No MS                         MV Inflow E Vel = 125.0 cm/sec      MV Annulus E'Vel = 6.5 cm/sec                         E/E'Ratio = 19.1              Tricuspid: MILD TR                    No TS                         263.0 cm/sec peak TR vel   30.7 mmHg peak RV pressure              Pulmonary: TRIVIAL PR                 No PS  _________________________________________________________________________________________  INTERPRETATION  NORMAL LEFT VENTRICULAR SYSTOLIC FUNCTION  NORMAL RIGHT VENTRICULAR SYSTOLIC FUNCTION  TRIVIAL REGURGITATION NOTED (See above)  MODERATE VALVULAR STENOSIS (See above) => peak gradient 35.5 mmHg; mean gradient 20 mmHg.   We can cancel the test was ordered.    Randene Bustard, MD

## 2024-03-23 NOTE — Assessment & Plan Note (Addendum)
  Anemia with hemoglobin down to 5.  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 Currently receiving Venofer  infusions She was able to tolerate that without any active angina or heart failure symptoms suggesting lack of any severe CAD. - Continue Venofer  infusions as prescribed. - Monitor hemoglobin levels and symptoms of anemia.  Blood counts seem to be drifting back up again. Would like to try to avoid antiplatelet or anticoagulation agents if necessary

## 2024-03-23 NOTE — Progress Notes (Addendum)
 Cardiology Office Note:  .   Date:  03/23/2024  ID:  Risha Barretta, DOB 1956/10/02, MRN [id number] PCP: Sari Cunning, MD  [facility] Providers Cardiologist:  None     Chief Complaint  Patient presents with   Follow-up    Follow up visit post procedure. Patient states that she has been having pains in her chest for almost a week. Patient states that she has been taking Pepcid and it is not working at all. Patient states that she is experiencing chest pain right. Patient states that she also experiences swelling in her ankles and shortness of breath. Meds reviewed.     Patient Profile: .     Jazariah Teall is a borderline obese 69 y.o. female former heavy smoker (2 PPD) with a PMH notable for Moderate Aortic Stenosis, ?DM-2 (escorted A1c was 5.5), HTN, HLD, GERD, NASH with recent GI bleed (GAVE), with right renal tumor s/p ablation who presents here for postprocedure follow-up  She was originally referred at the request of Francee Inch, NP) for Sari Cunning, MD to assist with preop evaluation for renal tumor ablation   [facility] was seen on January 16 for preop assessment for CT guided renal tumor ablation.  She carries a diagnosis of "CHF "but had not been followed by cardiologist.  Had an echocardiogram done at [facility] clinic in 2024 revealing moderate aortic stenosis with a mean gradient of 20 mmHg..  She denied any cardiac symptoms or chest pain pressure or dyspnea with rest or exertion.  Nothing to suggest angina or heart failure.  Mobility very limited by ongoing hip and back pain following hip replacement surgery in March 2024.  She was on low-dose Lasix PRN but not really having any significant episodes of PND or orthopnea.  Mostly right leg swelling on occasion.  Subjective  Discussed the use of AI scribe software for clinical note transcription with the patient, who gave verbal consent to proceed.  History of Present Illness History of Present  Illness Salimatou Simone is a 68 year old female with a history of kidney tumor ablation and gastrointestinal bleeding who presents with persistent chest pressure.  She has been experiencing persistent chest pressure since last Friday, described as a sensation of a 'fist' pushing between her chest. The pressure is present both when sitting and standing and does not worsen with physical activity. She notes associated shortness of breath. No nausea, irregular heartbeats, or palpitations. She has been taking Pepcid twice daily, but it has not alleviated her symptoms.  In January, she underwent a kidney tumor ablation procedure. In April, she was hospitalized for a gastrointestinal bleed and anemia, with a hemoglobin level of 5. 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 She received three units of blood transfusions and IV iron  during her hospital stay. Post-discharge, she has been receiving Venofer  infusions for iron  supplementation.  She has a history of liver disease, esophagitis, and a non-bleeding cluster in the colon, for which biopsies were taken. She is currently on multiple medications including furosemide, spironolactone, amlodipine , Ozempic, metformin, glimepiride, Protonix , Xifaxan , Abilify , Lexapro , and Lamictal .  She experiences swelling in her right ankle, for which she takes furosemide daily. She also has a history of bipolar disorder, managed with Abilify , Lexapro , and Lamictal . She sleeps in a reclined position due to hip discomfort and uses one pillow. No recent trauma or incidents that could explain her chest discomfort.  Her past medical history includes aortic valve narrowing, for which she has not had a recent echocardiogram. She reports a family history of heart issues, as her father experienced chest pain.   Cardiovascular ROS: positive for - chest pain and some exertional dyspnea negative for  - edema, irregular heartbeat, orthopnea, palpitations, paroxysmal nocturnal dyspnea, rapid heart rate, or syncope/near syncope, TIA/CVA/amaurosis fugax, claudication  ROS:  Review of Systems - Negative except symptoms noted in HPI    Objective   Current Cardiac/Diabetes Meds  Medication Sig   amLODipine  (NORVASC ) 5 MG tablet Take 5 mg by mouth daily.   furosemide (LASIX) 20 MG tablet Take 20 mg by mouth daily as needed for fluid or edema.   glimepiride (AMARYL) 2 MG tablet Take 2 mg by mouth every morning.   metFORMIN (GLUCOPHAGE) 500 MG tablet Take 500 mg by mouth 2 (two) times daily with a meal    OZEMPIC, 1 MG/DOSE, 4 MG/3ML SOPN Inject 1 mg into the skin once a week. Sundays   spironolactone (ALDACTONE) 25 MG tablet Take 25 mg by mouth daily.   Noncardiac/Diabetes meds  Medication Sig   ARIPiprazole  (ABILIFY ) 10 MG tablet Take 1 tablet (10 mg total) by mouth daily.   escitalopram  (LEXAPRO ) 10 MG tablet TAKE ONE TABLET BY MOUTH ONE TIME DAILY (Patient taking differently: Take 10 mg by mouth at bedtime.)   HYDROcodone -acetaminophen  (NORCO) 10-325 MG tablet Take 1 tablet by mouth every 8 (eight) hours as needed for severe pain (pain score 7-10). Must last 30 days.   lamoTRIgine  (LAMICTAL ) 200 MG tablet & 25 MG tablet TAKE ONE of each TABLET BY MOUTH ONE TIME DAILY (Take total dose 225 mg by mouth daily.)   pantoprazole  (PROTONIX ) 40 MG tablet Take 40 mg by mouth every morning.   polyethylene glycol powder (GLYCOLAX/MIRALAX) 17 GM/SCOOP powder Take 17 g by mouth daily.   tizanidine (ZANAFLEX) 2 MG capsule Take 2 mg by mouth 3 (three) times daily as needed for muscle spasms. Takes differently   XIFAXAN  550 MG TABS tablet Take 550 mg by mouth 2 (two) times daily.    Studies Reviewed: Aaron Aas   EKG Interpretation Date/Time:  Thursday Mar 23 2024 10:41:19 EDT Ventricular Rate:  66 PR Interval:  158 QRS Duration:  64 QT Interval:  416 QTC Calculation: 436 R Axis:   8  Text  Interpretation: Normal sinus rhythm Normal ECG Premature ventricular complexes When compared with ECG of 18-Nov-2023 10:20, No significant change was found Confirmed by Randene Bustard ([id number]) on 03/23/2024 11:06:23 AM    DIAGNOSTIC Colonoscopy: Non-bleeding cluster of lesions, biopsies performed (02/2024) Endoscopy: GAVE, esophagitis, plasma coagulation of lesions (02/2024) ECHO 12/15/2022 [facility]): Normal LV size and function with an EF> 55%.  Normal wall motion.  Mild LVH with GR 1 DD and mild LA dilation.  Moderate calcific AS-mean AVG 20 mmHg  Echo 02/23/24:  Normal EF>55%.  Mild LVH.  Normal LA pressures with GR 1 DD.  Normal RV size and function.  Mild AS.  Peak gradient 24 mmHg, mean 13 mmHg.  Peak velocity 2.4 m/s  PATHOLOGY Non-cancerous renal tumor ablation (11/30/2023)  From [facility] Component 10/25/23 06/22/23 02/09/23 10/06/22  Cholesterol, Total 142 143 138 138  Triglyceride 86 120 84 97  HDL (High Density Lipoprotein) Cholesterol 51 47.9 54.1 47.9  LDL Calculated 74 71 67 71  Hgb A1c 5.5 6.7 6.0 7.4   Hb: 5 g/dL (82/9562)    Latest Ref Rng & Units 02/21/2024  1:14 PM 02/15/2024   11:07 AM 02/15/2024    4:24 AM  CBC  WBC 4.0 - 10.5 K/uL 5.4  3.9    Hemoglobin 12.0 - 15.0 g/dL 9.0  8.8  6.7   Hematocrit 36.0 - 46.0 % 30.0  28.1  21.8   Platelets 150 - 400 K/uL 82  106      Risk Assessment/Calculations:            Physical Exam:   VS:  BP (!) 124/54   Pulse 66   Ht 5\' 4"  (1.626 m)   Wt 174 lb (78.9 kg)   SpO2 98%   BMI 29.87 kg/m    Wt Readings from Last 3 Encounters:  03/23/24 174 lb (78.9 kg)  02/24/24 176 lb (79.8 kg)  02/21/24 176 lb 4.8 oz (80 kg)    GEN: Chronically ill-appearing.  Borderline obese.  Well nourished, well groomed; in no acute distress; has a rolling walker with significant antalgic gait. NECK: No JVD; No carotid bruits CARDIAC:  RRR; normal S1 and S2; 2/6 SEM at RUSB.  Otherwise no additional  murmurs, rubs, gallops; chest wall tenderness along the costosternal border bilaterally from mid sternum to manubrium.  Reproducible symptoms. RESPIRATORY:  Clear to auscultation without rales, wheezing or rhonchi ; nonlabored, good air movement. ABDOMEN: Soft, non-tender, non-distended EXTREMITIES: Both ankles have braces in place, the right ankle seems to be more swollen than the left but it appears more swelling than edema.    ASSESSMENT AND PLAN: .    Problem List Items Addressed This Visit       Cardiology Problems   HTN (hypertension) (Chronic)   Hyperlipidemia associated with type 2 diabetes mellitus (HCC) (Chronic)   Her most recent A1c and LDL are both relatively well-controlled.  Not on any medication for lipids but is on glimepiride 2 mg daily, Glucophage 500 mg twice daily and Ozempic injections.  With her ongoing chest discomfort would like to exclude adverse events before we consider Coronary Calcium Score.      Moderate aortic stenosis by prior echocardiography - Primary (Chronic)   Aortic valve stenosis with echocardiogram planned for follow-up. Symptoms not suggestive of acute cardiac event.  - Monitor for symptoms of worsening stenosis.  Follow-up echocardiogram done at [facility] in April 2025-gradients actually improved.      Relevant Orders   ECHOCARDIOGRAM COMPLETE     Other   RESOLVED: Chest pain   Relevant Orders   Troponin I (High Sensitivity) (Completed)   EKG 12-Lead (Completed)   Gastrointestinal hemorrhage (Chronic)   Gastric antral vascular ectasia (GAVE) GAVE identified during endoscopy with cauterization of lesions. Likely contributing to gastrointestinal bleeding and anemia. - Continue follow-up with gastroenterology for management of GAVE. - Discuss with gastroenterologist regarding ongoing symptoms and potential further interventions.  Esophagitis identified during endoscopy, likely contributing to gastrointestinal symptoms. - Continue current  management with antacids as needed. - Discuss with gastroenterologist for further evaluation and management.  Hiatal hernia Hiatal hernia identified, potentially contributing to gastrointestinal symptoms. - Discuss with gastroenterologist regarding management of hiatal hernia.  Liver disease Liver disease potentially contributing to GAVE and esophageal varices. Managed with Xifaxan . - Continue Xifaxan  for liver disease management. - Monitor liver function tests and symptoms related to liver disease.       Precordial pain   Reproducible chest wall pain along both sternal borders in the lower third of the sternum. Intermittent chest pain likely musculoskeletal, possibly costochondritis. Non-cardiac origin suggested by lack of relief from  antacids. - Order echocardiogram to evaluate aortic valve and cardiac function. - Perform blood work for cardiac injury markers. - Apply warm compress or lidocaine  patch for symptomatic relief. - Consider Tylenol  for pain management, avoiding NSAIDs.       Relevant Orders   Troponin I (High Sensitivity) (Completed)   Symptomatic anemia    Anemia with hemoglobin down to 5.  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 Currently receiving Venofer  infusions She was able to tolerate that without any active angina or heart failure symptoms suggesting lack of any severe CAD. - Continue Venofer  infusions as prescribed. - Monitor hemoglobin levels and symptoms of anemia.  Blood counts seem to be drifting back up again. Would like to try to avoid antiplatelet or anticoagulation agents if necessary        Follow-up Follow-up plans for ongoing management and evaluation of current symptoms. - Schedule follow-up appointment after echocardiogram results are available. - Coordinate with gastroenterology for ongoing management of GAVE and related symptoms.         Follow-Up: Return for Routine Follow-up after testing ~ 1-2 months, [place]  office.  Recording duration: 30 minutes Total time spent: 30 min spent with patient + 14 min spent charting = 44 min    Signed, Arleen Lacer, MD, MS Randene Bustard, M.D., M.S. Interventional Chartered certified accountant  Pager # [phone]

## 2024-03-23 NOTE — Telephone Encounter (Signed)
 Let her know that the follow-up echocardiogram actually showed better gradients.  More mild to moderate as opposed to moderate Aortic Stenosis  Randene Bustard, MD

## 2024-03-24 ENCOUNTER — Other Ambulatory Visit: Payer: Self-pay | Admitting: Emergency Medicine

## 2024-03-28 ENCOUNTER — Encounter: Payer: Self-pay | Admitting: *Deleted

## 2024-04-07 ENCOUNTER — Encounter: Payer: Self-pay | Admitting: Internal Medicine

## 2024-04-07 ENCOUNTER — Other Ambulatory Visit: Payer: Self-pay | Admitting: Internal Medicine

## 2024-04-07 ENCOUNTER — Other Ambulatory Visit

## 2024-04-07 DIAGNOSIS — Z1231 Encounter for screening mammogram for malignant neoplasm of breast: Secondary | ICD-10-CM

## 2024-04-13 ENCOUNTER — Other Ambulatory Visit: Payer: Self-pay

## 2024-04-13 ENCOUNTER — Telehealth: Payer: Self-pay | Admitting: *Deleted

## 2024-04-13 ENCOUNTER — Other Ambulatory Visit: Payer: Self-pay | Admitting: *Deleted

## 2024-04-13 ENCOUNTER — Inpatient Hospital Stay (HOSPITAL_BASED_OUTPATIENT_CLINIC_OR_DEPARTMENT_OTHER): Admitting: Nurse Practitioner

## 2024-04-13 ENCOUNTER — Inpatient Hospital Stay: Attending: Internal Medicine

## 2024-04-13 ENCOUNTER — Encounter: Payer: Self-pay | Admitting: Nurse Practitioner

## 2024-04-13 ENCOUNTER — Inpatient Hospital Stay

## 2024-04-13 VITALS — BP 121/43 | HR 72 | Resp 18

## 2024-04-13 VITALS — BP 122/31 | HR 73 | Temp 98.9°F | Resp 19 | Wt 179.0 lb

## 2024-04-13 DIAGNOSIS — Z79899 Other long term (current) drug therapy: Secondary | ICD-10-CM | POA: Insufficient documentation

## 2024-04-13 DIAGNOSIS — D500 Iron deficiency anemia secondary to blood loss (chronic): Secondary | ICD-10-CM | POA: Diagnosis present

## 2024-04-13 DIAGNOSIS — D649 Anemia, unspecified: Secondary | ICD-10-CM | POA: Diagnosis not present

## 2024-04-13 DIAGNOSIS — K922 Gastrointestinal hemorrhage, unspecified: Secondary | ICD-10-CM | POA: Diagnosis present

## 2024-04-13 LAB — BASIC METABOLIC PANEL - CANCER CENTER ONLY
Anion gap: 12 (ref 5–15)
BUN: 32 mg/dL — ABNORMAL HIGH (ref 8–23)
CO2: 21 mmol/L — ABNORMAL LOW (ref 22–32)
Calcium: 8.6 mg/dL — ABNORMAL LOW (ref 8.9–10.3)
Chloride: 100 mmol/L (ref 98–111)
Creatinine: 1.32 mg/dL — ABNORMAL HIGH (ref 0.44–1.00)
GFR, Estimated: 45 mL/min — ABNORMAL LOW (ref >60)
Glucose, Bld: 321 mg/dL — ABNORMAL HIGH (ref 70–99)
Potassium: 4.5 mmol/L (ref 3.5–5.1)
Sodium: 133 mmol/L — ABNORMAL LOW (ref 135–145)

## 2024-04-13 LAB — CBC WITH DIFFERENTIAL (CANCER CENTER ONLY)
Abs Immature Granulocytes: 0.03 10*3/uL (ref 0.00–0.07)
Basophils Absolute: 0 10*3/uL (ref 0.0–0.1)
Basophils Relative: 0 %
Eosinophils Absolute: 0 10*3/uL (ref 0.0–0.5)
Eosinophils Relative: 0 %
HCT: 22.5 % — ABNORMAL LOW (ref 36.0–46.0)
Hemoglobin: 6.9 g/dL — CL (ref 12.0–15.0)
Immature Granulocytes: 1 %
Lymphocytes Relative: 10 %
Lymphs Abs: 0.6 10*3/uL — ABNORMAL LOW (ref 0.7–4.0)
MCH: 25.5 pg — ABNORMAL LOW (ref 26.0–34.0)
MCHC: 30.7 g/dL (ref 30.0–36.0)
MCV: 83 fL (ref 80.0–100.0)
Monocytes Absolute: 0.4 10*3/uL (ref 0.1–1.0)
Monocytes Relative: 6 %
Neutro Abs: 5.5 10*3/uL (ref 1.7–7.7)
Neutrophils Relative %: 83 %
Platelet Count: 104 10*3/uL — ABNORMAL LOW (ref 150–400)
RBC: 2.71 MIL/uL — ABNORMAL LOW (ref 3.87–5.11)
RDW: 17.7 % — ABNORMAL HIGH (ref 11.5–15.5)
WBC Count: 6.6 10*3/uL (ref 4.0–10.5)
nRBC: 0 % (ref 0.0–0.2)

## 2024-04-13 LAB — PREPARE RBC (CROSSMATCH)

## 2024-04-13 MED ORDER — IRON SUCROSE 20 MG/ML IV SOLN
200.0000 mg | Freq: Once | INTRAVENOUS | Status: AC
  Administered 2024-04-13: 200 mg via INTRAVENOUS
  Filled 2024-04-13: qty 10

## 2024-04-13 NOTE — Telephone Encounter (Signed)
 1447-Rn spoke with Margaret [name] in [facility] lab. Critical value reported read back process performed with lab tech - hgb- 6.9.  1448-Lauren, NP informed of critical value. Read back process performed with provider. Patient will need 1 unit of blood tom.

## 2024-04-13 NOTE — Progress Notes (Signed)
 [facility] CONSULT NOTE  Patient Care Team: Sari Cunning, MD as PCP - General (Internal Medicine) Gwyn Leos, MD as Consulting Physician (Internal Medicine) Shane Darling, MD as Consulting Physician (Gastroenterology)  CHIEF COMPLAINTS/PURPOSE OF CONSULTATION: Anemia/thrombocytopenia  HEMATOLOGY HISTORY  06/21/2019-06/24/2019 due to severe anemia; hemoglobin of 5.7 upon admission; patient received 2 units of blood transfusion, 3 doses of IV iron . EGD and colonoscopy were performed during the hospitalization EGD and colonoscopy on 06/23/2019. [Dr.Anna; [facility]] EGD showed no abnormalities and the colonoscopy showed 3 sessile diminutive polyps which were resected completely.  Arteries of the duodenum were normal.  3 colon polyps resected were serrated polyp x2 and a tubular adenoma x1. No active source of bleeding was noted.    # Cirrhosis NAFLD- [? [facility]]  She saw Dr Valentine Gasmen on 02/14/24 and was found to have hemoglobin 5.1 with significant symptoms. She went to ER and was admitted. On 02/15/24, she underwent EGD with Dr Ole Berkeley which revealed LA  Grade A esophagitis w/o bleeding. Biopsied. 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 She also underwent colonoscopy which revealed single nonbleeding colonic angiodysplastic lesion treated with APC. 2 small polyps were removed. She also underwent capsule study. She received several blood transfusions. She was discharged home on 02/16/24.   HISTORY OF PRESENTING ILLNESS: ambulating with rolling walker [arthritis].   Margaret [name] 68 y.o.  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   She feels much better. Denies palpitations and dizziness but somewhat fatigued and weak. She has chronically black/dark  stools which is unchanged. She has ongoing fatigue.    Review of Systems  Constitutional:  Negative for chills, diaphoresis, fever, malaise/fatigue and weight loss.  Respiratory:  Negative for cough, hemoptysis, sputum production, shortness of breath and wheezing.   Cardiovascular:  Negative for chest pain, palpitations, orthopnea and leg swelling.  Gastrointestinal:  Positive for melena. Negative for abdominal pain, blood in stool, constipation, diarrhea, heartburn, nausea and vomiting.  Genitourinary:  Negative for dysuria, hematuria and urgency.  Musculoskeletal:  Positive for back pain and joint pain. Negative for falls.  Skin:  Negative for itching and rash.  Neurological:  Negative for dizziness, tingling, focal weakness, weakness and headaches.  Endo/Heme/Allergies:  Does not bruise/bleed easily.  Psychiatric/Behavioral:  Negative for depression. The patient is not nervous/anxious and does not have insomnia.    MEDICAL HISTORY:  Past Medical History:  Diagnosis Date   (HFpEF) heart failure with preserved ejection fraction (HCC)    a.) TTE 12/15/2022: EF >55%, mild LVH, G1DD, mild LAE, mild MAC, triv AR/MR/TR, mild TR, RVSP 30.7, mod AS (MPG 20)   Anxiety    Aortic atherosclerosis (HCC)    Aortic stenosis    a.) TTE 12/15/2022: moderate AS (MPG 20 mmHg; AVA = 1.7 cm2)   Bilateral carotid artery disease (HCC)    a.) doppler 12/22/2020: <50% BICA   Bilateral renal cysts    Bipolar 1 disorder (HCC)    CAD (coronary artery disease) 11/04/2022   a.) CT chest 11/04/2022: 3v CAD   Cardiac murmur    CKD (chronic kidney disease), stage III (HCC)    DDD (degenerative disc disease), lumbar    Depression    DM (diabetes mellitus), type 2 (HCC)    Former smoker    Frequent falls    GERD (gastroesophageal  reflux disease)    H/O: GI bleed    Heart murmur    History of blood transfusion    History of MRSA infection 2017   HLD (hyperlipidemia)    Hypertension    IDA (iron  deficiency  anemia)    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H [facility])    Multiple pulmonary nodules    Nephrolithiasis    Occasional tremors    Osteoarthritis    Osteoporosis    Portal hypertensive gastropathy (HCC)    Portal venous hypertension (HCC)    Renal mass, right 03/30/2023   a.) MRI ABD 03/30/2023: 1.2 x 1.2 cm complex cystic renal cortical mass in the posterior mid to low RIGHT kidney (Bosniak 3); b.) MRI ABD 08/03/2023: interval increase in size to 1.4 x 1.3 cm -> concerning for RCC; c.) US  ABD 10/12/2023: 1.7 complex cystic mass RIGHT kidney; d.) MRI ABD 11/15/2023: interval increase in size to 1.6 x 1.2 x 1.0 cm   Splenomegaly    Thoracic spondylosis    Thrombocytopenia (HCC)    Tubular adenoma of colon    Uses walker    Venous insufficiency    Ventral hernia    SURGICAL HISTORY: Past Surgical History:  Procedure Laterality Date   ABDOMINAL HYSTERECTOMY     CHOLECYSTECTOMY     COLONOSCOPY N/A 02/15/2024   Procedure: COLONOSCOPY;  Surgeon: [name], MD;  Location: [facility] ENDOSCOPY;  Service: Endoscopy;  Laterality: N/A;   COLONOSCOPY  02/15/2024   Procedure: COLONOSCOPY, WITH ARGON PLASMA COAGULATION;  Surgeon: [name], MD;  Location: [facility] ENDOSCOPY;  Service: Endoscopy;;   COLONOSCOPY WITH PROPOFOL  N/A 06/23/2019   Procedure: COLONOSCOPY WITH PROPOFOL ;  Surgeon: Luke Salaam, MD;  Location: [facility] ENDOSCOPY;  Service: Gastroenterology;  Laterality: N/A;   COLONOSCOPY WITH PROPOFOL  N/A 12/08/2022   Procedure: COLONOSCOPY WITH PROPOFOL ;  Surgeon: Shane Darling, MD;  Location: [facility] ENDOSCOPY;  Service: Endoscopy;  Laterality: N/A;   COLONOSCOPY WITH PROPOFOL  N/A 05/18/2023   Procedure: COLONOSCOPY WITH PROPOFOL ;  Surgeon: Shane Darling, MD;  Location: [facility] ENDOSCOPY;  Service: Endoscopy;  Laterality: N/A;   ESOPHAGOGASTRODUODENOSCOPY N/A 02/15/2024   Procedure: EGD (ESOPHAGOGASTRODUODENOSCOPY);  Surgeon: [name], MD;  Location: [facility] ENDOSCOPY;  Service: Endoscopy;   Laterality: N/A;   ESOPHAGOGASTRODUODENOSCOPY (EGD) WITH PROPOFOL  N/A 06/23/2019   Procedure: ESOPHAGOGASTRODUODENOSCOPY (EGD) WITH PROPOFOL ;  Surgeon: Luke Salaam, MD;  Location: [facility] ENDOSCOPY;  Service: Gastroenterology;  Laterality: N/A;   ESOPHAGOGASTRODUODENOSCOPY (EGD) WITH PROPOFOL  N/A 12/08/2022   Procedure: ESOPHAGOGASTRODUODENOSCOPY (EGD) WITH PROPOFOL ;  Surgeon: Shane Darling, MD;  Location: [facility] ENDOSCOPY;  Service: Endoscopy;  Laterality: N/A;  DM, OZEMPIC   ESOPHAGOGASTRODUODENOSCOPY (EGD) WITH PROPOFOL  N/A 01/12/2023   Procedure: ESOPHAGOGASTRODUODENOSCOPY (EGD) WITH PROPOFOL ;  Surgeon: Shane Darling, MD;  Location: [facility] ENDOSCOPY;  Service: Endoscopy;  Laterality: N/A;  DM   GIVENS CAPSULE STUDY  02/15/2024   Procedure: IMAGING PROCEDURE, GI TRACT, INTRALUMINAL, VIA CAPSULE;  Surgeon: [name], MD;  Location: [facility] ENDOSCOPY;  Service: Endoscopy;;   HOT HEMOSTASIS  02/15/2024   Procedure: EGD, WITH ARGON PLASMA COAGULATION;  Surgeon: [name], MD;  Location: [facility] ENDOSCOPY;  Service: Endoscopy;;   IR RADIOLOGIST EVAL & MGMT  10/28/2023   IR RADIOLOGIST EVAL & MGMT  01/18/2024   POLYPECTOMY  05/18/2023   Procedure: POLYPECTOMY;  Surgeon: Shane Darling, MD;  Location: [facility] ENDOSCOPY;  Service: Endoscopy;;   POLYPECTOMY  02/15/2024   Procedure: POLYPECTOMY, INTESTINE;  Surgeon: [name], MD;  Location: [facility] ENDOSCOPY;  Service: Endoscopy;;   TONSILLECTOMY  TOTAL HIP ARTHROPLASTY Right 2017   SOCIAL HISTORY: Social History   Socioeconomic History   Marital status: Divorced    Spouse name: Not on file   Number of children: 2   Years of education: Not on file   Highest education level: Not on file  Occupational History   Not on file  Tobacco Use   Smoking status: Former    Current packs/day: 0.00    Types: Cigarettes    Quit date: 03/03/2011    Years since quitting: 13.1   Smokeless tobacco: Never  Vaping Use   Vaping status: Never Used   Substance and Sexual Activity   Alcohol use: Yes    Comment: rarely   Drug use: Not Currently   Sexual activity: Not Currently  Other Topics Concern   Not on file  Social History Narrative   Not on file   Social Drivers of Health   Financial Resource Strain: Low Risk  (06/29/2023)   Received from [facility]   Overall Financial Resource Strain (CARDIA)    Difficulty of Paying Living Expenses: Not hard at all  Food Insecurity: No Food Insecurity (02/14/2024)   Hunger Vital Sign    Worried About Running Out of Food in the Last Year: Never true    Ran Out of Food in the Last Year: Never true  Transportation Needs: No Transportation Needs (02/14/2024)   PRAPARE - Administrator, Civil Service (Medical): No    Lack of Transportation (Non-Medical): No  Physical Activity: Inactive (06/16/2021)   Received from [facility]   Exercise Vital Sign    On average, how many days per week do you engage in moderate to strenuous exercise (like a brisk walk)?: 0 days    On average, how many minutes do you engage in exercise at this level?: 0 min  Stress: No Stress Concern Present (06/16/2021)   Received from [facility] - Occupational Stress Questionnaire  Social Connections: Moderately Integrated (02/14/2024)   Social Connection and Isolation Panel    Frequency of Communication with Friends and Family: More than three times a week    Frequency of Social Gatherings with Friends and Family: More than three times a week    Attends Religious Services: More than 4 times per year    Active Member of [employer] or Organizations: Yes    Attends Engineer, structural: More than 4 times per year    Marital Status: Divorced  Intimate Partner Violence: Not At Risk (02/14/2024)   Humiliation, Afraid, Rape, and Kick questionnaire    Fear of Current or Ex-Partner: No    Emotionally Abused: No     Physically Abused: No    Sexually Abused: No   FAMILY HISTORY: Family History  Problem Relation Age of Onset   Lung cancer Mother    Heart attack Father 12   Mental illness Father    Bipolar disorder Brother    Bipolar disorder Paternal Uncle    Bipolar disorder Paternal Uncle    ALLERGIES:  is allergic to lithium, penicillins, sulfa antibiotics, and benzonatate.  MEDICATIONS:  Current Outpatient Medications  Medication Sig Dispense Refill   amLODipine  (NORVASC ) 5 MG tablet Take 5 mg by mouth daily.     ARIPiprazole  (ABILIFY ) 10 MG tablet Take 1 tablet (10 mg total) by mouth daily. 90 tablet 0   escitalopram  (LEXAPRO ) 10 MG tablet TAKE ONE TABLET BY MOUTH ONE TIME  DAILY (Patient taking differently: Take 10 mg by mouth at bedtime.) 90 tablet 3   furosemide (LASIX) 20 MG tablet Take 20 mg by mouth daily as needed for fluid or edema.     glimepiride (AMARYL) 2 MG tablet Take 2 mg by mouth every morning.     HYDROcodone -acetaminophen  (NORCO) 10-325 MG tablet Take 1 tablet by mouth every 8 (eight) hours as needed for severe pain (pain score 7-10). Must last 30 days. 90 tablet 0   [START ON 05/02/2024] HYDROcodone -acetaminophen  (NORCO) 10-325 MG tablet Take 1 tablet by mouth every 8 (eight) hours as needed for severe pain (pain score 7-10). Must last 30 days. 90 tablet 0   lamoTRIgine  (LAMICTAL ) 200 MG tablet TAKE ONE TABLET BY MOUTH ONE TIME DAILY (Patient taking differently: Take 225 mg by mouth daily.) 90 tablet 3   lamoTRIgine  (LAMICTAL ) 25 MG tablet Take 1 tablet (25 mg total) by mouth daily. Take daily with 200 mg , total of 225 mg 90 tablet 0   meloxicam (MOBIC) 7.5 MG tablet Take 7.5 mg by mouth.     metFORMIN (GLUCOPHAGE) 500 MG tablet Take 500 mg by mouth 2 (two) times daily with a meal.     OZEMPIC, 1 MG/DOSE, 4 MG/3ML SOPN Inject 1 mg into the skin once a week. Sundays     pantoprazole  (PROTONIX ) 40 MG tablet Take 40 mg by mouth every morning.     polyethylene glycol powder  (GLYCOLAX/MIRALAX) 17 GM/SCOOP powder Take 17 g by mouth daily.     predniSONE (DELTASONE) 10 MG tablet Take 10 mg by mouth.     spironolactone (ALDACTONE) 25 MG tablet Take 25 mg by mouth daily.     tizanidine (ZANAFLEX) 2 MG capsule Take 2 mg by mouth 3 (three) times daily as needed for muscle spasms. Takes differently     XIFAXAN  550 MG TABS tablet Take 550 mg by mouth 2 (two) times daily.     No current facility-administered medications for this visit.     PHYSICAL EXAMINATION: Vitals:   04/13/24 1502  BP: (!) 122/31  Pulse: 73  Resp: 19  Temp: 98.9 F (37.2 C)  SpO2: 99%   Filed Weights   04/13/24 1502  Weight: 179 lb (81.2 kg)   Physical Exam Vitals reviewed.  Constitutional:      Appearance: She is not ill-appearing.   Eyes:     General: No scleral icterus.   Cardiovascular:     Rate and Rhythm: Normal rate and regular rhythm.  Pulmonary:     Effort: No respiratory distress.  Abdominal:     General: There is no distension.     Palpations: Abdomen is soft.     Tenderness: There is no abdominal tenderness. There is no guarding.   Skin:    General: Skin is warm and dry.   Neurological:     Mental Status: She is alert and oriented to person, place, and time.   Psychiatric:        Mood and Affect: Mood normal.        Behavior: Behavior normal.     LABORATORY DATA:  I have reviewed the data as listed Lab Results  Component Value Date   WBC 6.6 04/13/2024   HGB 6.9 (LL) 04/13/2024   HCT 22.5 (L) 04/13/2024   MCV 83.0 04/13/2024   PLT 104 (L) 04/13/2024   Iron /TIBC/Ferritin/ %Sat    Component Value Date/Time   IRON  46 02/14/2024 2144   IRON  37 12/06/2019 1507  TIBC 581 (H) 02/14/2024 2144   TIBC 428 12/06/2019 1507   FERRITIN 8 (L) 02/14/2024 2144   FERRITIN 45 12/06/2019 1507   IRONPCTSAT 8 (L) 02/14/2024 2144   IRONPCTSAT 9 (LL) 12/06/2019 1507   No results found.   ASSESSMENT & PLAN:   Symptomatic anemia # [2020] History of iron   deficiency anemia secondary to chronic GI bleed/cirrhosis; GAVE [EGD in G+FEB 2024].  FEB 2024- Iron  sat 9 Ferritin 45- wnl-PCP; Hb 9-10.  s/p IV iron  x4 [JAN 2024]- MM panel-WNL.   # April 2025- Hmg 5.1. Ferritin 8, Iron  sat 8%. S/p hospitalization and transfusions.   # Today- hmg 6.9. Plan for venofer  today and 1 unit pRBCs tomorrow. Recheck next week and I'll see her back in 2 weeks.    # Etiology: Cirrhosis/GI bleed FEB 2024- GAVE -non-bleeding-; colo-? Capsule study.  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 Given recent drop in hemoglobin and hospitalization she underwent colonoscopy, endoscopy, and capsule (not resulted) with Dr. Ole Berkeley which showed multiple angiodysplastic lesions, treated with APC, 3 small polyps removed, non-bleeding internal hemorrhoids, esophagitis. Pathology of esophagus was consistent with reflux. Descending colon polyps were tubular adenomas without high grade dysplasia. Sigmoid colon polyp was hyperplastic. She has EGD with Dr Emerick Hanlon scheduled for next week.    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 Stable.    # Liver cirrhosis, splenomegaly, small volume ascites.  Patient does not drink alcohol. NAFLD. Followed by GI. 10/2023 US  - cirrhosis, retroperitoneal LNs. January 2025 MRI Abdomen w/wo contrast- chronic liver disease with micronodular liver with fibrosis, ascites, left sided varices. No aggressive appearing masses. Prominent upper abdominal retroperitoneal nodes similar to 2024 MRI. AFP 12/29/22 was 2.5/stable. Defer to GI for HCC screening.   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 More complex lesion along mid right kidney is stable. Surveillance in 6 months recommended. Defer to GI.    # Weight loss sec to oezmpic [40 pounds]- stable. Low concerns for malignancy at this time.    # DISPOSITION: Iron  today Blood tomorrow Next  week- Wednesday- lab (H&H), josh, venofer  Thursday- +/- 1 unit pRBCs 2 weeks- lab (H&H), see me, +/- venofer - la  No problem-specific Assessment & Plan notes found for this encounter.  All questions were answered. The patient knows to call the clinic with any problems, questions or concerns.  Nelda Balsam, NP 04/13/2024

## 2024-04-14 ENCOUNTER — Inpatient Hospital Stay

## 2024-04-14 DIAGNOSIS — D500 Iron deficiency anemia secondary to blood loss (chronic): Secondary | ICD-10-CM | POA: Diagnosis not present

## 2024-04-14 DIAGNOSIS — D649 Anemia, unspecified: Secondary | ICD-10-CM

## 2024-04-14 MED ORDER — ACETAMINOPHEN 325 MG PO TABS
650.0000 mg | ORAL_TABLET | Freq: Once | ORAL | Status: DC
  Filled 2024-04-14: qty 2

## 2024-04-14 MED ORDER — DIPHENHYDRAMINE HCL 25 MG PO CAPS
25.0000 mg | ORAL_CAPSULE | Freq: Once | ORAL | Status: AC
  Administered 2024-04-14: 25 mg via ORAL
  Filled 2024-04-14: qty 1

## 2024-04-14 MED ORDER — SODIUM CHLORIDE 0.9% IV SOLUTION
250.0000 mL | INTRAVENOUS | Status: DC
  Administered 2024-04-14: 250 mL via INTRAVENOUS
  Filled 2024-04-14: qty 250

## 2024-04-15 LAB — BPAM RBC
Blood Product Expiration Date: 202507092359
ISSUE DATE / TIME: 202506131122
Unit Type and Rh: 5100

## 2024-04-15 LAB — TYPE AND SCREEN
ABO/RH(D): B POS
Antibody Screen: NEGATIVE
Unit division: 0

## 2024-04-17 ENCOUNTER — Other Ambulatory Visit

## 2024-04-17 ENCOUNTER — Ambulatory Visit: Admitting: Internal Medicine

## 2024-04-17 ENCOUNTER — Ambulatory Visit

## 2024-04-18 ENCOUNTER — Other Ambulatory Visit: Payer: Self-pay | Admitting: *Deleted

## 2024-04-18 DIAGNOSIS — D649 Anemia, unspecified: Secondary | ICD-10-CM

## 2024-04-19 ENCOUNTER — Inpatient Hospital Stay (HOSPITAL_BASED_OUTPATIENT_CLINIC_OR_DEPARTMENT_OTHER): Admitting: Hospice and Palliative Medicine

## 2024-04-19 ENCOUNTER — Inpatient Hospital Stay

## 2024-04-19 ENCOUNTER — Encounter: Payer: Self-pay | Admitting: Hospice and Palliative Medicine

## 2024-04-19 VITALS — BP 123/44 | HR 73 | Temp 98.9°F | Resp 17 | Wt 181.0 lb

## 2024-04-19 VITALS — BP 119/47 | HR 66

## 2024-04-19 DIAGNOSIS — D500 Iron deficiency anemia secondary to blood loss (chronic): Secondary | ICD-10-CM | POA: Diagnosis not present

## 2024-04-19 DIAGNOSIS — D649 Anemia, unspecified: Secondary | ICD-10-CM

## 2024-04-19 LAB — SAMPLE TO BLOOD BANK

## 2024-04-19 LAB — HEMOGLOBIN AND HEMATOCRIT (CANCER CENTER ONLY)
HCT: 29.3 % — ABNORMAL LOW (ref 36.0–46.0)
Hemoglobin: 9 g/dL — ABNORMAL LOW (ref 12.0–15.0)

## 2024-04-19 MED ORDER — IRON SUCROSE 20 MG/ML IV SOLN
200.0000 mg | Freq: Once | INTRAVENOUS | Status: AC
  Administered 2024-04-19: 200 mg via INTRAVENOUS

## 2024-04-19 NOTE — Progress Notes (Signed)
 Symptom Management Clinic [facility] at [facility] Telephone:(336) [phone] Fax:(336) [phone]  Patient Care Team: Sari Cunning, MD as PCP - General (Internal Medicine) Gwyn Leos, MD as Consulting Physician (Internal Medicine) Emerick Hanlon Leanora Prophet, MD as Consulting Physician (Gastroenterology)   NAME OF PATIENT: Margaret [name]  [id number]  [birth year]   DATE OF VISIT: 04/19/24  REASON FOR CONSULT: Margaret [name] is a 68 y.o. female with multiple medical problems including IDA, cirrhosis.  Patient status post EGD on 02/15/2024 revealing esophagitis without bleeding.  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  Patient also underwent colonoscopy revealing a single nonbleeding colonic angiodysplastic lesion.  2 small polyps were also removed.  Patient has received several doses of Venofer .  INTERVAL HISTORY: Patient saw Kenney Peacemaker, NP on 04/13/2024 for follow-up regarding IDA.  Patient was noted to have hemoglobin of 6.9.  She received Venofer  200 mg x 1 and also 1 unit PRBC.  Patient presents back to clinic today for follow-up labs and consideration of additional Venofer .  Today, patient reports feeling good.  Denies any significant changes or concerns.  Has chronic fatigue but this has not changed in intensity or severity.  Denies shortness of breath or chest pain.  Denies dark or tarry stools.  Denies any neurologic complaints. Denies recent fevers or illnesses. Denies any easy bleeding or bruising. Reports good appetite and denies weight loss. Denies chest pain. Denies any nausea, vomiting, constipation, or diarrhea. Denies urinary complaints. Patient offers no further specific complaints today.   PAST MEDICAL HISTORY: Past Medical History:  Diagnosis Date   (HFpEF) heart failure with preserved ejection fraction (HCC)    a.) TTE 12/15/2022: EF >55%, mild LVH, G1DD, mild LAE, mild MAC, triv AR/MR/TR, mild TR, RVSP 30.7, mod AS  (MPG 20)   Anxiety    Aortic atherosclerosis (HCC)    Aortic stenosis    a.) TTE 12/15/2022: moderate AS (MPG 20 mmHg; AVA = 1.7 cm2)   Bilateral carotid artery disease (HCC)    a.) doppler 12/22/2020: <50% BICA   Bilateral renal cysts    Bipolar 1 disorder (HCC)    CAD (coronary artery disease) 11/04/2022   a.) CT chest 11/04/2022: 3v CAD   Cardiac murmur    CKD (chronic kidney disease), stage III (HCC)    DDD (degenerative disc disease), lumbar    Depression    DM (diabetes mellitus), type 2 (HCC)    Former smoker    Frequent falls    GERD (gastroesophageal reflux disease)    H/O: GI bleed    Heart murmur    History of blood transfusion    History of MRSA infection 2017   HLD (hyperlipidemia)    Hypertension    IDA (iron  deficiency anemia)    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H [facility])    Multiple pulmonary nodules    Nephrolithiasis    Occasional tremors    Osteoarthritis    Osteoporosis    Portal hypertensive gastropathy (HCC)    Portal venous hypertension (HCC)    Renal mass, right 03/30/2023   a.) MRI ABD 03/30/2023: 1.2 x 1.2 cm complex cystic renal cortical mass in the posterior mid to low RIGHT kidney (Bosniak 3); b.) MRI ABD 08/03/2023: interval increase in size to 1.4 x 1.3 cm -> concerning for RCC; c.) US  ABD 10/12/2023: 1.7 complex cystic mass RIGHT kidney; d.) MRI ABD 11/15/2023: interval increase in size to 1.6 x 1.2 x 1.0 cm   Splenomegaly  Thoracic spondylosis    Thrombocytopenia (HCC)    Tubular adenoma of colon    Uses walker    Venous insufficiency    Ventral hernia     PAST SURGICAL HISTORY:  Past Surgical History:  Procedure Laterality Date   ABDOMINAL HYSTERECTOMY     CHOLECYSTECTOMY     COLONOSCOPY N/A 02/15/2024   Procedure: COLONOSCOPY;  Surgeon: [name], MD;  Location: [facility] ENDOSCOPY;  Service: Endoscopy;  Laterality: N/A;   COLONOSCOPY  02/15/2024   Procedure: COLONOSCOPY, WITH ARGON PLASMA COAGULATION;  Surgeon: [name], MD;   Location: [facility] ENDOSCOPY;  Service: Endoscopy;;   COLONOSCOPY WITH PROPOFOL  N/A 06/23/2019   Procedure: COLONOSCOPY WITH PROPOFOL ;  Surgeon: Luke Salaam, MD;  Location: [facility] ENDOSCOPY;  Service: Gastroenterology;  Laterality: N/A;   COLONOSCOPY WITH PROPOFOL  N/A 12/08/2022   Procedure: COLONOSCOPY WITH PROPOFOL ;  Surgeon: Shane Darling, MD;  Location: [facility] ENDOSCOPY;  Service: Endoscopy;  Laterality: N/A;   COLONOSCOPY WITH PROPOFOL  N/A 05/18/2023   Procedure: COLONOSCOPY WITH PROPOFOL ;  Surgeon: Shane Darling, MD;  Location: [facility] ENDOSCOPY;  Service: Endoscopy;  Laterality: N/A;   ESOPHAGOGASTRODUODENOSCOPY N/A 02/15/2024   Procedure: EGD (ESOPHAGOGASTRODUODENOSCOPY);  Surgeon: [name], MD;  Location: [facility] ENDOSCOPY;  Service: Endoscopy;  Laterality: N/A;   ESOPHAGOGASTRODUODENOSCOPY (EGD) WITH PROPOFOL  N/A 06/23/2019   Procedure: ESOPHAGOGASTRODUODENOSCOPY (EGD) WITH PROPOFOL ;  Surgeon: Luke Salaam, MD;  Location: [facility] ENDOSCOPY;  Service: Gastroenterology;  Laterality: N/A;   ESOPHAGOGASTRODUODENOSCOPY (EGD) WITH PROPOFOL  N/A 12/08/2022   Procedure: ESOPHAGOGASTRODUODENOSCOPY (EGD) WITH PROPOFOL ;  Surgeon: Shane Darling, MD;  Location: [facility] ENDOSCOPY;  Service: Endoscopy;  Laterality: N/A;  DM, OZEMPIC   ESOPHAGOGASTRODUODENOSCOPY (EGD) WITH PROPOFOL  N/A 01/12/2023   Procedure: ESOPHAGOGASTRODUODENOSCOPY (EGD) WITH PROPOFOL ;  Surgeon: Shane Darling, MD;  Location: [facility] ENDOSCOPY;  Service: Endoscopy;  Laterality: N/A;  DM   GIVENS CAPSULE STUDY  02/15/2024   Procedure: IMAGING PROCEDURE, GI TRACT, INTRALUMINAL, VIA CAPSULE;  Surgeon: [name], MD;  Location: [facility] ENDOSCOPY;  Service: Endoscopy;;   HOT HEMOSTASIS  02/15/2024   Procedure: EGD, WITH ARGON PLASMA COAGULATION;  Surgeon: [name], MD;  Location: [facility] ENDOSCOPY;  Service: Endoscopy;;   IR RADIOLOGIST EVAL & MGMT  10/28/2023   IR RADIOLOGIST EVAL & MGMT  01/18/2024   POLYPECTOMY  05/18/2023    Procedure: POLYPECTOMY;  Surgeon: Shane Darling, MD;  Location: [facility] ENDOSCOPY;  Service: Endoscopy;;   POLYPECTOMY  02/15/2024   Procedure: POLYPECTOMY, INTESTINE;  Surgeon: [name], MD;  Location: [facility] ENDOSCOPY;  Service: Endoscopy;;   TONSILLECTOMY     TOTAL HIP ARTHROPLASTY Right 2017    HEMATOLOGY/ONCOLOGY HISTORY:  Oncology History   No history exists.    ALLERGIES:  is allergic to lithium, penicillins, sulfa antibiotics, and benzonatate.  MEDICATIONS:  Current Outpatient Medications  Medication Sig Dispense Refill   amLODipine  (NORVASC ) 5 MG tablet Take 5 mg by mouth daily.     ARIPiprazole  (ABILIFY ) 10 MG tablet Take 1 tablet (10 mg total) by mouth daily. 90 tablet 0   escitalopram  (LEXAPRO ) 10 MG tablet TAKE ONE TABLET BY MOUTH ONE TIME DAILY (Patient taking differently: Take 10 mg by mouth at bedtime.) 90 tablet 3   furosemide (LASIX) 20 MG tablet Take 20 mg by mouth daily as needed for fluid or edema.     glimepiride (AMARYL) 2 MG tablet Take 2 mg by mouth every morning.     HYDROcodone -acetaminophen  (NORCO) 10-325 MG tablet Take 1 tablet by mouth every 8 (eight) hours as needed for severe  pain (pain score 7-10). Must last 30 days. 90 tablet 0   [START ON 05/02/2024] HYDROcodone -acetaminophen  (NORCO) 10-325 MG tablet Take 1 tablet by mouth every 8 (eight) hours as needed for severe pain (pain score 7-10). Must last 30 days. 90 tablet 0   lamoTRIgine  (LAMICTAL ) 200 MG tablet TAKE ONE TABLET BY MOUTH ONE TIME DAILY (Patient taking differently: Take 225 mg by mouth daily.) 90 tablet 3   lamoTRIgine  (LAMICTAL ) 25 MG tablet Take 1 tablet (25 mg total) by mouth daily. Take daily with 200 mg , total of 225 mg 90 tablet 0   meloxicam (MOBIC) 7.5 MG tablet Take 7.5 mg by mouth.     metFORMIN (GLUCOPHAGE) 500 MG tablet Take 500 mg by mouth 2 (two) times daily with a meal.     OZEMPIC, 1 MG/DOSE, 4 MG/3ML SOPN Inject 1 mg into the skin once a week. Sundays     pantoprazole   (PROTONIX ) 40 MG tablet Take 40 mg by mouth every morning.     polyethylene glycol powder (GLYCOLAX/MIRALAX) 17 GM/SCOOP powder Take 17 g by mouth daily.     spironolactone (ALDACTONE) 25 MG tablet Take 25 mg by mouth daily.     tizanidine (ZANAFLEX) 2 MG capsule Take 2 mg by mouth 3 (three) times daily as needed for muscle spasms. Takes differently     XIFAXAN  550 MG TABS tablet Take 550 mg by mouth 2 (two) times daily.     No current facility-administered medications for this visit.    VITAL SIGNS: There were no vitals taken for this visit. There were no vitals filed for this visit.  Estimated body mass index is 30.73 kg/m as calculated from the following:   Height as of 03/23/24: 5' 4 (1.626 m).   Weight as of 04/13/24: 179 lb (81.2 kg).  LABS: CBC:    Component Value Date/Time   WBC 6.6 04/13/2024 1430   WBC 3.9 (L) 02/15/2024 1107   HGB 6.9 (LL) 04/13/2024 1430   HCT 22.5 (L) 04/13/2024 1430   PLT 104 (L) 04/13/2024 1430   MCV 83.0 04/13/2024 1430   NEUTROABS 5.5 04/13/2024 1430   LYMPHSABS 0.6 (L) 04/13/2024 1430   MONOABS 0.4 04/13/2024 1430   EOSABS 0.0 04/13/2024 1430   BASOSABS 0.0 04/13/2024 1430   Comprehensive Metabolic Panel:    Component Value Date/Time   NA 133 (L) 04/13/2024 1430   NA 137 12/06/2019 1507   K 4.5 04/13/2024 1430   CL 100 04/13/2024 1430   CO2 21 (L) 04/13/2024 1430   BUN 32 (H) 04/13/2024 1430   BUN 11 12/06/2019 1507   CREATININE 1.32 (H) 04/13/2024 1430   GLUCOSE 321 (H) 04/13/2024 1430   CALCIUM 8.6 (L) 04/13/2024 1430   AST 45 (H) 02/15/2024 1107   AST 29 01/05/2024 1038   ALT 24 02/15/2024 1107   ALT 20 01/05/2024 1038   ALKPHOS 83 02/15/2024 1107   BILITOT 2.2 (H) 02/15/2024 1107   BILITOT 0.9 01/05/2024 1038   PROT 6.3 (L) 02/15/2024 1107   PROT 6.1 12/06/2019 1507   ALBUMIN  3.4 (L) 02/15/2024 1107   ALBUMIN  3.7 (L) 12/06/2019 1507    RADIOGRAPHIC STUDIES: No results found.  PERFORMANCE STATUS (ECOG) : 1 -  Symptomatic but completely ambulatory  Review of Systems Unless otherwise noted, a complete review of systems is negative.  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  Patient has repeat EGD/colonoscopy scheduled later this week.  Hemoglobin improved today to 9.0, up from 6.9 last week after transfusion/Venofer .  No overt signs of bleeding currently.  Patient does not require additional transfusion at this time.  Will proceed with second dose of Venofer  today.  Patient to follow-up in clinic next week as scheduled for repeat labs and to see Kenney Peacemaker, NP.   Patient expressed understanding and was in agreement with this plan. She also understands that She can call clinic at any time with any questions, concerns, or complaints.   Thank you for allowing me to participate in the care of this very pleasant patient.   Time Total: 15 minutes  Visit consisted of counseling and education dealing with the complex and emotionally intense issues of symptom management in the setting of serious illness.Greater than 50%  of this time was spent counseling and coordinating care related to the above assessment and plan.  Signed by: Gerilyn Kobus, PhD, NP-C

## 2024-04-19 NOTE — Progress Notes (Signed)
 Patient here for heme follow-up appointment, expresses concerns of fatigue

## 2024-04-19 NOTE — Patient Instructions (Signed)

## 2024-04-19 NOTE — Progress Notes (Signed)
 Declined post-observation. Aware of risks. Vitals stable at discharge.

## 2024-04-20 ENCOUNTER — Inpatient Hospital Stay

## 2024-04-21 ENCOUNTER — Encounter: Payer: Self-pay | Admitting: Anesthesiology

## 2024-04-21 ENCOUNTER — Encounter
Admission: RE | Disposition: A | Discharge status: Home / Self Care | Payer: Self-pay | Source: Ambulatory Visit | Attending: Gastroenterology

## 2024-04-21 ENCOUNTER — Ambulatory Visit
Admission: RE | Admit: 2024-04-21 | Discharge: 2024-04-21 | Disposition: A | Discharge status: Home / Self Care | Source: Ambulatory Visit | Attending: Gastroenterology | Admitting: Gastroenterology

## 2024-04-21 DIAGNOSIS — Z539 Procedure and treatment not carried out, unspecified reason: Secondary | ICD-10-CM | POA: Insufficient documentation

## 2024-04-21 DIAGNOSIS — D509 Iron deficiency anemia, unspecified: Secondary | ICD-10-CM | POA: Insufficient documentation

## 2024-04-21 DIAGNOSIS — K921 Melena: Secondary | ICD-10-CM | POA: Insufficient documentation

## 2024-04-21 DIAGNOSIS — K7460 Unspecified cirrhosis of liver: Secondary | ICD-10-CM | POA: Diagnosis not present

## 2024-04-21 SURGERY — EGD (ESOPHAGOGASTRODUODENOSCOPY)
Anesthesia: General

## 2024-04-21 MED ORDER — SODIUM CHLORIDE 0.9 % IV SOLN: INTRAVENOUS | Status: DC

## 2024-04-27 ENCOUNTER — Inpatient Hospital Stay

## 2024-04-27 ENCOUNTER — Encounter: Payer: Self-pay | Admitting: Nurse Practitioner

## 2024-04-27 ENCOUNTER — Inpatient Hospital Stay: Admitting: Nurse Practitioner

## 2024-04-27 ENCOUNTER — Other Ambulatory Visit: Payer: Self-pay | Admitting: *Deleted

## 2024-04-27 VITALS — BP 126/42 | HR 70

## 2024-04-27 VITALS — BP 133/42 | HR 75 | Temp 98.8°F | Resp 16 | Wt 181.0 lb

## 2024-04-27 DIAGNOSIS — D500 Iron deficiency anemia secondary to blood loss (chronic): Secondary | ICD-10-CM

## 2024-04-27 DIAGNOSIS — D649 Anemia, unspecified: Secondary | ICD-10-CM

## 2024-04-27 LAB — SAMPLE TO BLOOD BANK

## 2024-04-27 LAB — HEMOGLOBIN AND HEMATOCRIT (CANCER CENTER ONLY)
HCT: 28.1 % — ABNORMAL LOW (ref 36.0–46.0)
Hemoglobin: 8.7 g/dL — ABNORMAL LOW (ref 12.0–15.0)

## 2024-04-27 MED ORDER — SODIUM CHLORIDE 0.9% FLUSH
10.0000 mL | Freq: Once | INTRAVENOUS | Status: AC | PRN
  Administered 2024-04-27: 10 mL
  Filled 2024-04-27: qty 10

## 2024-04-27 MED ORDER — IRON SUCROSE 20 MG/ML IV SOLN
200.0000 mg | Freq: Once | INTRAVENOUS | Status: AC
  Administered 2024-04-27: 200 mg via INTRAVENOUS

## 2024-04-27 NOTE — Progress Notes (Signed)
 [facility] CONSULT NOTE  Patient Care Team: Cleotilde Oneil FALCON, MD as PCP - General (Internal Medicine) Rennie Cindy SAUNDERS, MD as Consulting Physician (Internal Medicine) Maryruth Ole DASEN, MD as Consulting Physician (Gastroenterology)  CHIEF COMPLAINTS/PURPOSE OF CONSULTATION: Anemia/thrombocytopenia  HEMATOLOGY HISTORY  06/21/2019-06/24/2019 due to severe anemia; hemoglobin of 5.7 upon admission; patient received 2 units of blood transfusion, 3 doses of IV iron . EGD and colonoscopy were performed during the hospitalization EGD and colonoscopy on 06/23/2019. [Dr.Anna; [facility]] EGD showed no abnormalities and the colonoscopy showed 3 sessile diminutive polyps which were resected completely.  Arteries of the duodenum were normal.  3 colon polyps resected were serrated polyp x2 and a tubular adenoma x1. No active source of bleeding was noted.    # Cirrhosis NAFLD- [? [facility]]  She saw Dr Rennie on 02/14/24 and was found to have hemoglobin 5.1 with significant symptoms. She went to ER and was admitted. On 02/15/24, she underwent EGD with Dr Jinny which revealed LA  Grade A esophagitis w/o bleeding. Biopsied. 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 She also underwent colonoscopy which revealed single nonbleeding colonic angiodysplastic lesion treated with APC. 2 small polyps were removed. She also underwent capsule study. She received several blood transfusions. She was discharged home on 02/16/24.   HISTORY OF PRESENTING ILLNESS: ambulating with rolling walker [arthritis].   Margaret [name] 68 y.o.  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   She feels much better. Denies palpitations and dizziness but somewhat fatigued and weak. She has chronically black/dark  stools which is unchanged. She has ongoing fatigue. Last transfusion 2 weeks ago.    Review of Systems  Constitutional:  Positive for chills and malaise/fatigue. Negative for diaphoresis, fever and weight loss.  Respiratory:  Negative for cough, hemoptysis, sputum production, shortness of breath and wheezing.   Cardiovascular:  Negative for chest pain, palpitations, orthopnea and leg swelling.  Gastrointestinal:  Positive for melena. Negative for abdominal pain, blood in stool, constipation, diarrhea, heartburn, nausea and vomiting.  Genitourinary:  Negative for dysuria, hematuria and urgency.  Musculoskeletal:  Positive for back pain and joint pain. Negative for falls.  Skin:  Negative for itching and rash.  Neurological:  Negative for dizziness, tingling, focal weakness, weakness and headaches.  Endo/Heme/Allergies:  Does not bruise/bleed easily.  Psychiatric/Behavioral:  Negative for depression. The patient is not nervous/anxious and does not have insomnia.    MEDICAL HISTORY:  Past Medical History:  Diagnosis Date   (HFpEF) heart failure with preserved ejection fraction (HCC)    a.) TTE 12/15/2022: EF >55%, mild LVH, G1DD, mild LAE, mild MAC, triv AR/MR/TR, mild TR, RVSP 30.7, mod AS (MPG 20)   Anxiety    Aortic atherosclerosis (HCC)    Aortic stenosis    a.) TTE 12/15/2022: moderate AS (MPG 20 mmHg; AVA = 1.7 cm2)   Bilateral carotid artery disease (HCC)    a.) doppler 12/22/2020: <50% BICA   Bilateral renal cysts    Bipolar 1 disorder (HCC)    CAD (coronary artery disease) 11/04/2022   a.) CT chest 11/04/2022: 3v CAD   Cardiac murmur    CKD (chronic kidney disease), stage III (HCC)    DDD (degenerative disc disease), lumbar    Depression    DM (diabetes mellitus), type 2 (HCC)    Former smoker  Frequent falls    GERD (gastroesophageal reflux disease)    H/O: GI bleed    Heart murmur    History of blood transfusion    History of MRSA infection 2017   HLD  (hyperlipidemia)    Hypertension    IDA (iron  deficiency anemia)    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H [facility])    Multiple pulmonary nodules    Nephrolithiasis    Occasional tremors    Osteoarthritis    Osteoporosis    Portal hypertensive gastropathy (HCC)    Portal venous hypertension (HCC)    Renal mass, right 03/30/2023   a.) MRI ABD 03/30/2023: 1.2 x 1.2 cm complex cystic renal cortical mass in the posterior mid to low RIGHT kidney (Bosniak 3); b.) MRI ABD 08/03/2023: interval increase in size to 1.4 x 1.3 cm -> concerning for RCC; c.) US  ABD 10/12/2023: 1.7 complex cystic mass RIGHT kidney; d.) MRI ABD 11/15/2023: interval increase in size to 1.6 x 1.2 x 1.0 cm   Splenomegaly    Thoracic spondylosis    Thrombocytopenia (HCC)    Tubular adenoma of colon    Uses walker    Venous insufficiency    Ventral hernia    SURGICAL HISTORY: Past Surgical History:  Procedure Laterality Date   ABDOMINAL HYSTERECTOMY     CHOLECYSTECTOMY     COLONOSCOPY N/A 02/15/2024   Procedure: COLONOSCOPY;  Surgeon: Jinny Carmine, MD;  Location: [facility] ENDOSCOPY;  Service: Endoscopy;  Laterality: N/A;   COLONOSCOPY  02/15/2024   Procedure: COLONOSCOPY, WITH ARGON PLASMA COAGULATION;  Surgeon: Jinny Carmine, MD;  Location: [facility] ENDOSCOPY;  Service: Endoscopy;;   COLONOSCOPY WITH PROPOFOL  N/A 06/23/2019   Procedure: COLONOSCOPY WITH PROPOFOL ;  Surgeon: Therisa Bi, MD;  Location: [facility] ENDOSCOPY;  Service: Gastroenterology;  Laterality: N/A;   COLONOSCOPY WITH PROPOFOL  N/A 12/08/2022   Procedure: COLONOSCOPY WITH PROPOFOL ;  Surgeon: Maryruth Ole DASEN, MD;  Location: [facility] ENDOSCOPY;  Service: Endoscopy;  Laterality: N/A;   COLONOSCOPY WITH PROPOFOL  N/A 05/18/2023   Procedure: COLONOSCOPY WITH PROPOFOL ;  Surgeon: Maryruth Ole DASEN, MD;  Location: [facility] ENDOSCOPY;  Service: Endoscopy;  Laterality: N/A;   ESOPHAGOGASTRODUODENOSCOPY N/A 02/15/2024   Procedure: EGD (ESOPHAGOGASTRODUODENOSCOPY);  Surgeon: Jinny Carmine,  MD;  Location: [facility] ENDOSCOPY;  Service: Endoscopy;  Laterality: N/A;   ESOPHAGOGASTRODUODENOSCOPY (EGD) WITH PROPOFOL  N/A 06/23/2019   Procedure: ESOPHAGOGASTRODUODENOSCOPY (EGD) WITH PROPOFOL ;  Surgeon: Therisa Bi, MD;  Location: [facility] ENDOSCOPY;  Service: Gastroenterology;  Laterality: N/A;   ESOPHAGOGASTRODUODENOSCOPY (EGD) WITH PROPOFOL  N/A 12/08/2022   Procedure: ESOPHAGOGASTRODUODENOSCOPY (EGD) WITH PROPOFOL ;  Surgeon: Maryruth Ole DASEN, MD;  Location: [facility] ENDOSCOPY;  Service: Endoscopy;  Laterality: N/A;  DM, OZEMPIC   ESOPHAGOGASTRODUODENOSCOPY (EGD) WITH PROPOFOL  N/A 01/12/2023   Procedure: ESOPHAGOGASTRODUODENOSCOPY (EGD) WITH PROPOFOL ;  Surgeon: Maryruth Ole DASEN, MD;  Location: [facility] ENDOSCOPY;  Service: Endoscopy;  Laterality: N/A;  DM   GIVENS CAPSULE STUDY  02/15/2024   Procedure: IMAGING PROCEDURE, GI TRACT, INTRALUMINAL, VIA CAPSULE;  Surgeon: Jinny Carmine, MD;  Location: [facility] ENDOSCOPY;  Service: Endoscopy;;   HOT HEMOSTASIS  02/15/2024   Procedure: EGD, WITH ARGON PLASMA COAGULATION;  Surgeon: Jinny Carmine, MD;  Location: [facility] ENDOSCOPY;  Service: Endoscopy;;   IR RADIOLOGIST EVAL & MGMT  10/28/2023   IR RADIOLOGIST EVAL & MGMT  01/18/2024   POLYPECTOMY  05/18/2023   Procedure: POLYPECTOMY;  Surgeon: Maryruth Ole DASEN, MD;  Location: [facility] ENDOSCOPY;  Service: Endoscopy;;   POLYPECTOMY  02/15/2024   Procedure: POLYPECTOMY, INTESTINE;  Surgeon: Jinny Carmine, MD;  Location: [facility]  ENDOSCOPY;  Service: Endoscopy;;   TONSILLECTOMY     TOTAL HIP ARTHROPLASTY Right 2017   SOCIAL HISTORY: Social History   Socioeconomic History   Marital status: Divorced    Spouse name: Not on file   Number of children: 2   Years of education: Not on file   Highest education level: Not on file  Occupational History   Not on file  Tobacco Use   Smoking status: Former    Current packs/day: 0.00    Types: Cigarettes    Quit date: 03/03/2011    Years since quitting: 13.1   Smokeless tobacco:  Never  Vaping Use   Vaping status: Never Used  Substance and Sexual Activity   Alcohol use: Yes    Comment: rarely   Drug use: Not Currently   Sexual activity: Not Currently  Other Topics Concern   Not on file  Social History Narrative   Not on file   Social Drivers of Health   Financial Resource Strain: Low Risk  (06/29/2023)   Received from [facility]   Overall Financial Resource Strain (CARDIA)    Difficulty of Paying Living Expenses: Not hard at all  Food Insecurity: No Food Insecurity (02/14/2024)   Hunger Vital Sign    Worried About Running Out of Food in the Last Year: Never true    Ran Out of Food in the Last Year: Never true  Transportation Needs: No Transportation Needs (02/14/2024)   PRAPARE - Administrator, Civil Service (Medical): No    Lack of Transportation (Non-Medical): No  Physical Activity: Inactive (06/16/2021)   Received from [facility]   Exercise Vital Sign    On average, how many days per week do you engage in moderate to strenuous exercise (like a brisk walk)?: 0 days    On average, how many minutes do you engage in exercise at this level?: 0 min  Stress: No Stress Concern Present (06/16/2021)   Received from [facility] of Occupational Health - Occupational Stress Questionnaire  Social Connections: Moderately Integrated (02/14/2024)   Social Connection and Isolation Panel    Frequency of Communication with Friends and Family: More than three times a week    Frequency of Social Gatherings with Friends and Family: More than three times a week    Attends Religious Services: More than 4 times per year    Active Member of [employer] or Organizations: Yes    Attends Engineer, structural: More than 4 times per year    Marital Status: Divorced  Intimate Partner Violence: Not At Risk (02/14/2024)   Humiliation, Afraid, Rape, and Kick questionnaire    Fear of Current or  Ex-Partner: No    Emotionally Abused: No    Physically Abused: No    Sexually Abused: No   FAMILY HISTORY: Family History  Problem Relation Age of Onset   Lung cancer Mother    Heart attack Father 34   Mental illness Father    Bipolar disorder Brother    Bipolar disorder Paternal Uncle    Bipolar disorder Paternal Uncle    ALLERGIES:  is allergic to lithium, penicillins, sulfa antibiotics, and benzonatate.  MEDICATIONS:  Current Outpatient Medications  Medication Sig Dispense Refill   amLODipine  (NORVASC ) 5 MG tablet Take 5 mg by mouth daily.     ARIPiprazole  (ABILIFY ) 10 MG tablet Take 1 tablet (10 mg total) by mouth daily. 90 tablet 0   escitalopram  (  LEXAPRO ) 10 MG tablet TAKE ONE TABLET BY MOUTH ONE TIME DAILY (Patient taking differently: Take 10 mg by mouth at bedtime.) 90 tablet 3   furosemide (LASIX) 20 MG tablet Take 20 mg by mouth daily as needed for fluid or edema.     glimepiride (AMARYL) 2 MG tablet Take 2 mg by mouth every morning.     HYDROcodone -acetaminophen  (NORCO) 10-325 MG tablet Take 1 tablet by mouth every 8 (eight) hours as needed for severe pain (pain score 7-10). Must last 30 days. 90 tablet 0   [START ON 05/02/2024] HYDROcodone -acetaminophen  (NORCO) 10-325 MG tablet Take 1 tablet by mouth every 8 (eight) hours as needed for severe pain (pain score 7-10). Must last 30 days. 90 tablet 0   lamoTRIgine  (LAMICTAL ) 200 MG tablet TAKE ONE TABLET BY MOUTH ONE TIME DAILY (Patient taking differently: Take 225 mg by mouth daily.) 90 tablet 3   lamoTRIgine  (LAMICTAL ) 25 MG tablet Take 1 tablet (25 mg total) by mouth daily. Take daily with 200 mg , total of 225 mg 90 tablet 0   metFORMIN (GLUCOPHAGE) 500 MG tablet Take 500 mg by mouth 2 (two) times daily with a meal.     OZEMPIC, 1 MG/DOSE, 4 MG/3ML SOPN Inject 1 mg into the skin once a week. Sundays     pantoprazole  (PROTONIX ) 40 MG tablet Take 40 mg by mouth every morning.     polyethylene glycol powder (GLYCOLAX/MIRALAX)  17 GM/SCOOP powder Take 17 g by mouth daily.     spironolactone (ALDACTONE) 25 MG tablet Take 25 mg by mouth daily.     tizanidine (ZANAFLEX) 2 MG capsule Take 2 mg by mouth 3 (three) times daily as needed for muscle spasms. Takes differently     XIFAXAN  550 MG TABS tablet Take 550 mg by mouth 2 (two) times daily.     meloxicam (MOBIC) 7.5 MG tablet Take 7.5 mg by mouth. (Patient not taking: Reported on 04/27/2024)     No current facility-administered medications for this visit.     PHYSICAL EXAMINATION: Vitals:   04/27/24 1329  BP: (!) 133/42  Pulse: 75  Resp: 16  Temp: 98.8 F (37.1 C)  SpO2: 98%    Filed Weights   04/27/24 1329  Weight: 181 lb (82.1 kg)    Physical Exam Vitals reviewed.  Constitutional:      Appearance: She is not ill-appearing.   Eyes:     General: No scleral icterus.   Cardiovascular:     Rate and Rhythm: Normal rate and regular rhythm.  Pulmonary:     Effort: No respiratory distress.  Abdominal:     General: There is no distension.     Palpations: Abdomen is soft.     Tenderness: There is no abdominal tenderness. There is no guarding.   Skin:    General: Skin is warm and dry.   Neurological:     Mental Status: She is alert and oriented to person, place, and time.   Psychiatric:        Mood and Affect: Mood normal.        Behavior: Behavior normal.     LABORATORY DATA:  I have reviewed the data as listed Lab Results  Component Value Date   WBC 6.6 04/13/2024   HGB 8.7 (L) 04/27/2024   HCT 28.1 (L) 04/27/2024   MCV 83.0 04/13/2024   PLT 104 (L) 04/13/2024   Iron /TIBC/Ferritin/ %Sat    Component Value Date/Time   IRON  46 02/14/2024 2144  IRON  37 12/06/2019 1507   TIBC 581 (H) 02/14/2024 2144   TIBC 428 12/06/2019 1507   FERRITIN 8 (L) 02/14/2024 2144   FERRITIN 45 12/06/2019 1507   IRONPCTSAT 8 (L) 02/14/2024 2144   IRONPCTSAT 9 (LL) 12/06/2019 1507   No results found.   ASSESSMENT & PLAN:   Symptomatic anemia #  [2020] History of iron  deficiency anemia secondary to chronic GI bleed/cirrhosis; GAVE [EGD in G+FEB 2024].  FEB 2024- Iron  sat 9 Ferritin 45- wnl-PCP; Hb 9-10.  s/p IV iron  x4 [JAN 2024]- MM panel-WNL.   # April 2025- Hmg 5.1. Ferritin 8, Iron  sat 8%. S/p hospitalization and transfusions. 2 weeks ago hmg dropped to 6.9. She received venofer  and one unit pRBCs.   # Today, hemogloibin 8.7. Last week 9. Plan for venofer  weekly.    # Etiology: Cirrhosis/GI bleed FEB 2024- GAVE -non-bleeding-; colo-? Capsule study.  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 Given recent drop in hemoglobin and hospitalization she underwent colonoscopy, endoscopy, and capsule (not resulted) with Dr. Jinny which showed multiple angiodysplastic lesions, treated with APC, 3 small polyps removed, non-bleeding internal hemorrhoids, esophagitis. Pathology of esophagus was consistent with reflux. Descending colon polyps were tubular adenomas without high grade dysplasia. Sigmoid colon polyp was hyperplastic.   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 Stable.    # Liver cirrhosis, splenomegaly, small volume ascites.  Patient does not drink alcohol. NAFLD. Followed by GI. 10/2023 US  - cirrhosis, retroperitoneal LNs. January 2025 MRI Abdomen w/wo contrast- chronic liver disease with micronodular liver with fibrosis, ascites, left sided varices. No aggressive appearing masses. Prominent upper abdominal retroperitoneal nodes similar to 2024 MRI. AFP 12/29/22 was 2.5/stable. Defer to GI for HCC screening.   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 More complex lesion along mid right kidney is stable. Surveillance in 6 months recommended. Defer to GI.    # Weight loss sec to oezmpic [40 pounds]- stable. Low concerns for malignancy at this time.    DISPOSITION: Venofer  today 1 week (prefers Friday)- lab (H&H)  + venofer  2 weeks (prefers Friday)- lab (H&H), see me, + venofer  3 weeks - lab (H&H), + venofer  4 weeks- lab (cbc), see me, + venofer , D2 poss transfusion- la  No problem-specific Assessment & Plan notes found for this encounter.  All questions were answered. The patient knows to call the clinic with any problems, questions or concerns.  Margaret KANDICE Dawn, NP 04/27/2024

## 2024-05-02 ENCOUNTER — Telehealth: Payer: Self-pay

## 2024-05-02 DIAGNOSIS — F3176 Bipolar disorder, in full remission, most recent episode depressed: Secondary | ICD-10-CM

## 2024-05-02 MED ORDER — ARIPIPRAZOLE 10 MG PO TABS: 10.0000 mg | ORAL_TABLET | Freq: Every day | ORAL | 0 refills | Status: DC

## 2024-05-02 NOTE — Telephone Encounter (Signed)
 Received fax from pharmacy requesting a refill for ARIPiprazole  (ABILIFY ) 10 MG tablet   Last visit 03-07-24 Next visit 06-19-24    Preferred pharmacy   [facility] [address] - [place], [place] - [address] Phone: [phone]  Fax: [phone]

## 2024-05-02 NOTE — Telephone Encounter (Signed)
I have sent Abilify to pharmacy. 

## 2024-05-02 NOTE — Addendum Note (Signed)
 Addended byBETHA COBY HEIGHT on: 05/02/2024 05:03 PM   Modules accepted: Orders

## 2024-05-03 ENCOUNTER — Inpatient Hospital Stay: Attending: Internal Medicine

## 2024-05-03 ENCOUNTER — Inpatient Hospital Stay

## 2024-05-03 VITALS — BP 132/55 | HR 64 | Temp 98.0°F | Resp 16

## 2024-05-03 DIAGNOSIS — D649 Anemia, unspecified: Secondary | ICD-10-CM | POA: Diagnosis not present

## 2024-05-03 DIAGNOSIS — Z79899 Other long term (current) drug therapy: Secondary | ICD-10-CM | POA: Diagnosis not present

## 2024-05-03 DIAGNOSIS — K922 Gastrointestinal hemorrhage, unspecified: Secondary | ICD-10-CM | POA: Diagnosis present

## 2024-05-03 DIAGNOSIS — R233 Spontaneous ecchymoses: Secondary | ICD-10-CM | POA: Insufficient documentation

## 2024-05-03 DIAGNOSIS — D500 Iron deficiency anemia secondary to blood loss (chronic): Secondary | ICD-10-CM | POA: Diagnosis present

## 2024-05-03 LAB — HEMOGLOBIN AND HEMATOCRIT (CANCER CENTER ONLY)
HCT: 28.3 % — ABNORMAL LOW (ref 36.0–46.0)
Hemoglobin: 8.8 g/dL — ABNORMAL LOW (ref 12.0–15.0)

## 2024-05-03 LAB — SAMPLE TO BLOOD BANK

## 2024-05-03 MED ORDER — IRON SUCROSE 20 MG/ML IV SOLN
200.0000 mg | Freq: Once | INTRAVENOUS | Status: AC
  Administered 2024-05-03: 200 mg via INTRAVENOUS
  Filled 2024-05-03: qty 10

## 2024-05-11 ENCOUNTER — Other Ambulatory Visit: Payer: Self-pay

## 2024-05-11 ENCOUNTER — Ambulatory Visit: Admitting: Cardiology

## 2024-05-11 ENCOUNTER — Other Ambulatory Visit

## 2024-05-11 ENCOUNTER — Ambulatory Visit

## 2024-05-11 ENCOUNTER — Ambulatory Visit: Admitting: Nurse Practitioner

## 2024-05-12 ENCOUNTER — Ambulatory Visit
Admission: RE | Admit: 2024-05-12 | Discharge: 2024-05-12 | Disposition: A | Discharge status: Home / Self Care | Source: Ambulatory Visit | Attending: Internal Medicine | Admitting: Internal Medicine

## 2024-05-12 ENCOUNTER — Other Ambulatory Visit: Payer: Self-pay | Admitting: *Deleted

## 2024-05-12 ENCOUNTER — Inpatient Hospital Stay (HOSPITAL_BASED_OUTPATIENT_CLINIC_OR_DEPARTMENT_OTHER): Admitting: Nurse Practitioner

## 2024-05-12 ENCOUNTER — Ambulatory Visit

## 2024-05-12 ENCOUNTER — Other Ambulatory Visit

## 2024-05-12 ENCOUNTER — Inpatient Hospital Stay

## 2024-05-12 ENCOUNTER — Encounter: Payer: Self-pay | Admitting: Nurse Practitioner

## 2024-05-12 VITALS — BP 124/45 | HR 69 | Temp 98.5°F | Resp 18 | Ht 64.0 in | Wt 179.0 lb

## 2024-05-12 VITALS — BP 127/56 | HR 71 | Temp 96.5°F | Resp 19

## 2024-05-12 DIAGNOSIS — D500 Iron deficiency anemia secondary to blood loss (chronic): Secondary | ICD-10-CM

## 2024-05-12 DIAGNOSIS — D649 Anemia, unspecified: Secondary | ICD-10-CM | POA: Diagnosis not present

## 2024-05-12 DIAGNOSIS — Z1231 Encounter for screening mammogram for malignant neoplasm of breast: Secondary | ICD-10-CM | POA: Diagnosis present

## 2024-05-12 LAB — HEMOGLOBIN AND HEMATOCRIT (CANCER CENTER ONLY)
HCT: 28.5 % — ABNORMAL LOW (ref 36.0–46.0)
Hemoglobin: 8.8 g/dL — ABNORMAL LOW (ref 12.0–15.0)

## 2024-05-12 LAB — SAMPLE TO BLOOD BANK

## 2024-05-12 MED ORDER — IRON SUCROSE 20 MG/ML IV SOLN
200.0000 mg | Freq: Once | INTRAVENOUS | Status: AC
Start: 2024-05-12 — End: 2024-05-12
  Administered 2024-05-12: 200 mg via INTRAVENOUS

## 2024-05-12 MED ORDER — SODIUM CHLORIDE 0.9% FLUSH
10.0000 mL | Freq: Once | INTRAVENOUS | Status: AC | PRN
  Administered 2024-05-12: 10 mL
  Filled 2024-05-12: qty 10

## 2024-05-12 NOTE — Progress Notes (Signed)
 [facility] CONSULT NOTE  Patient Care Team: Cleotilde Oneil FALCON, MD as PCP - General (Internal Medicine) Rennie Cindy SAUNDERS, MD as Consulting Physician (Internal Medicine) Maryruth Ole DASEN, MD as Consulting Physician (Gastroenterology)  CHIEF COMPLAINTS/PURPOSE OF CONSULTATION: Anemia/thrombocytopenia  HEMATOLOGY HISTORY  06/21/2019-06/24/2019 due to severe anemia; hemoglobin of 5.7 upon admission; patient received 2 units of blood transfusion, 3 doses of IV iron . EGD and colonoscopy were performed during the hospitalization EGD and colonoscopy on 06/23/2019. [Dr.Anna; [facility]] EGD showed no abnormalities and the colonoscopy showed 3 sessile diminutive polyps which were resected completely.  Arteries of the duodenum were normal.  3 colon polyps resected were serrated polyp x2 and a tubular adenoma x1. No active source of bleeding was noted.    # Cirrhosis NAFLD- [? [facility]]  She saw Dr Rennie on 02/14/24 and was found to have hemoglobin 5.1 with significant symptoms. She went to ER and was admitted. On 02/15/24, she underwent EGD with Dr Jinny which revealed LA  Grade A esophagitis w/o bleeding. Biopsied. 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 She also underwent colonoscopy which revealed single nonbleeding colonic angiodysplastic lesion treated with APC. 2 small polyps were removed. She also underwent capsule study. She received several blood transfusions. She was discharged home on 02/16/24.   HISTORY OF PRESENTING ILLNESS: ambulating with rolling walker [arthritis].   Margaret [name] 68 y.o.  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   She feels much better. Denies palpitations and dizziness but somewhat fatigued and weak. She has chronically black/dark  stools which is unchanged. She says she feels like she is bleeding still. Last received IV iron  last week. Fell at home, tripped over child's feet, and broke her toes. She has ongoing fatigue. Last transfusion 3 weeks ago.    Review of Systems  Constitutional:  Positive for chills and malaise/fatigue. Negative for diaphoresis, fever and weight loss.  Respiratory:  Negative for cough, hemoptysis, sputum production, shortness of breath and wheezing.   Cardiovascular:  Negative for chest pain, palpitations, orthopnea and leg swelling.  Gastrointestinal:  Positive for melena. Negative for abdominal pain, blood in stool, constipation, diarrhea, heartburn, nausea and vomiting.  Genitourinary:  Negative for dysuria, hematuria and urgency.  Musculoskeletal:  Positive for back pain, falls and joint pain.  Skin:  Negative for itching and rash.  Neurological:  Negative for dizziness, tingling, focal weakness, weakness and headaches.  Endo/Heme/Allergies:  Does not bruise/bleed easily.  Psychiatric/Behavioral:  Negative for depression. The patient is not nervous/anxious and does not have insomnia.    MEDICAL HISTORY:  Past Medical History:  Diagnosis Date   (HFpEF) heart failure with preserved ejection fraction (HCC)    a.) TTE 12/15/2022: EF >55%, mild LVH, G1DD, mild LAE, mild MAC, triv AR/MR/TR, mild TR, RVSP 30.7, mod AS (MPG 20)   Anxiety    Aortic atherosclerosis (HCC)    Aortic stenosis    a.) TTE 12/15/2022: moderate AS (MPG 20 mmHg; AVA = 1.7 cm2)   Bilateral carotid artery disease (HCC)    a.) doppler 12/22/2020: <50% BICA   Bilateral renal cysts    Bipolar 1 disorder (HCC)    CAD (coronary artery disease) 11/04/2022   a.) CT chest 11/04/2022: 3v CAD   Cardiac murmur    CKD (chronic kidney disease), stage III (HCC)    DDD (  degenerative disc disease), lumbar    Depression    DM (diabetes mellitus), type 2 (HCC)    Former smoker    Frequent falls    GERD (gastroesophageal reflux  disease)    H/O: GI bleed    Heart murmur    History of blood transfusion    History of MRSA infection 2017   HLD (hyperlipidemia)    Hypertension    IDA (iron  deficiency anemia)    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)    Multiple pulmonary nodules    Nephrolithiasis    Occasional tremors    Osteoarthritis    Osteoporosis    Portal hypertensive gastropathy (HCC)    Portal venous hypertension (HCC)    Renal mass, right 03/30/2023   a.) MRI ABD 03/30/2023: 1.2 x 1.2 cm complex cystic renal cortical mass in the posterior mid to low RIGHT kidney (Bosniak 3); b.) MRI ABD 08/03/2023: interval increase in size to 1.4 x 1.3 cm -> concerning for RCC; c.) US  ABD 10/12/2023: 1.7 complex cystic mass RIGHT kidney; d.) MRI ABD 11/15/2023: interval increase in size to 1.6 x 1.2 x 1.0 cm   Splenomegaly    Thoracic spondylosis    Thrombocytopenia (HCC)    Tubular adenoma of colon    Uses walker    Venous insufficiency    Ventral hernia    SURGICAL HISTORY: Past Surgical History:  Procedure Laterality Date   ABDOMINAL HYSTERECTOMY     CHOLECYSTECTOMY     COLONOSCOPY N/A 02/15/2024   Procedure: COLONOSCOPY;  Surgeon: Jinny Carmine, MD;  Location: [facility] ENDOSCOPY;  Service: Endoscopy;  Laterality: N/A;   COLONOSCOPY  02/15/2024   Procedure: COLONOSCOPY, WITH ARGON PLASMA COAGULATION;  Surgeon: Jinny Carmine, MD;  Location: [facility] ENDOSCOPY;  Service: Endoscopy;;   COLONOSCOPY WITH PROPOFOL  N/A 06/23/2019   Procedure: COLONOSCOPY WITH PROPOFOL ;  Surgeon: Therisa Bi, MD;  Location: [facility] ENDOSCOPY;  Service: Gastroenterology;  Laterality: N/A;   COLONOSCOPY WITH PROPOFOL  N/A 12/08/2022   Procedure: COLONOSCOPY WITH PROPOFOL ;  Surgeon: Maryruth Ole DASEN, MD;  Location: [facility] ENDOSCOPY;  Service: Endoscopy;  Laterality: N/A;   COLONOSCOPY WITH PROPOFOL  N/A 05/18/2023   Procedure: COLONOSCOPY WITH PROPOFOL ;  Surgeon: Maryruth Ole DASEN, MD;  Location: [facility] ENDOSCOPY;  Service: Endoscopy;  Laterality:  N/A;   ESOPHAGOGASTRODUODENOSCOPY N/A 02/15/2024   Procedure: EGD (ESOPHAGOGASTRODUODENOSCOPY);  Surgeon: Jinny Carmine, MD;  Location: [facility] ENDOSCOPY;  Service: Endoscopy;  Laterality: N/A;   ESOPHAGOGASTRODUODENOSCOPY (EGD) WITH PROPOFOL  N/A 06/23/2019   Procedure: ESOPHAGOGASTRODUODENOSCOPY (EGD) WITH PROPOFOL ;  Surgeon: Therisa Bi, MD;  Location: [facility] ENDOSCOPY;  Service: Gastroenterology;  Laterality: N/A;   ESOPHAGOGASTRODUODENOSCOPY (EGD) WITH PROPOFOL  N/A 12/08/2022   Procedure: ESOPHAGOGASTRODUODENOSCOPY (EGD) WITH PROPOFOL ;  Surgeon: Maryruth Ole DASEN, MD;  Location: [facility] ENDOSCOPY;  Service: Endoscopy;  Laterality: N/A;  DM, OZEMPIC   ESOPHAGOGASTRODUODENOSCOPY (EGD) WITH PROPOFOL  N/A 01/12/2023   Procedure: ESOPHAGOGASTRODUODENOSCOPY (EGD) WITH PROPOFOL ;  Surgeon: Maryruth Ole DASEN, MD;  Location: [facility] ENDOSCOPY;  Service: Endoscopy;  Laterality: N/A;  DM   GIVENS CAPSULE STUDY  02/15/2024   Procedure: IMAGING PROCEDURE, GI TRACT, INTRALUMINAL, VIA CAPSULE;  Surgeon: Jinny Carmine, MD;  Location: [facility] ENDOSCOPY;  Service: Endoscopy;;   HOT HEMOSTASIS  02/15/2024   Procedure: EGD, WITH ARGON PLASMA COAGULATION;  Surgeon: Jinny Carmine, MD;  Location: [facility] ENDOSCOPY;  Service: Endoscopy;;   IR RADIOLOGIST EVAL & MGMT  10/28/2023   IR RADIOLOGIST EVAL & MGMT  01/18/2024   POLYPECTOMY  05/18/2023   Procedure: POLYPECTOMY;  Surgeon: Maryruth Ole DASEN, MD;  Location: [facility] ENDOSCOPY;  Service: Endoscopy;;   POLYPECTOMY  02/15/2024   Procedure: POLYPECTOMY, INTESTINE;  Surgeon: Jinny Carmine, MD;  Location: [facility] ENDOSCOPY;  Service: Endoscopy;;   TONSILLECTOMY     TOTAL HIP ARTHROPLASTY Right 2017   SOCIAL HISTORY: Social History   Socioeconomic History   Marital status: Divorced    Spouse name: Not on file   Number of children: 2   Years of education: Not on file   Highest education level: Not on file  Occupational History   Not on file  Tobacco Use   Smoking status: Former     Current packs/day: 0.00    Types: Cigarettes    Quit date: 03/03/2011    Years since quitting: 13.2   Smokeless tobacco: Never  Vaping Use   Vaping status: Never Used  Substance and Sexual Activity   Alcohol use: Yes    Comment: rarely   Drug use: Not Currently   Sexual activity: Not Currently  Other Topics Concern   Not on file  Social History Narrative   Not on file   Social Drivers of Health   Financial Resource Strain: Low Risk  (05/04/2024)   Received from [facility]   Overall Financial Resource Strain (CARDIA)    Difficulty of Paying Living Expenses: Not hard at all  Food Insecurity: No Food Insecurity (05/04/2024)   Received from [facility]   Hunger Vital Sign    Within the past 12 months, you worried that your food would run out before you got the money to buy more.: Never true    Within the past 12 months, the food you bought just didn't last and you didn't have money to get more.: Never true  Transportation Needs: No Transportation Needs (05/04/2024)   Received from [facility] - Transportation    In the past 12 months, has lack of transportation kept you from medical appointments or from getting medications?: No    Lack of Transportation (Non-Medical): No  Physical Activity: Inactive (06/16/2021)   Received from [facility]   Exercise Vital Sign    On average, how many days per week do you engage in moderate to strenuous exercise (like a brisk walk)?: 0 days    On average, how many minutes do you engage in exercise at this level?: 0 min  Stress: No Stress Concern Present (06/16/2021)   Received from [facility] - Occupational Stress Questionnaire  Social Connections: Moderately Integrated (02/14/2024)   Social Connection and Isolation Panel    Frequency of Communication with Friends and Family: More than three times a week     Frequency of Social Gatherings with Friends and Family: More than three times a week    Attends Religious Services: More than 4 times per year    Active Member of [employer] or Organizations: Yes    Attends Engineer, structural: More than 4 times per year    Marital Status: Divorced  Intimate Partner Violence: Not At Risk (02/14/2024)   Humiliation, Afraid, Rape, and Kick questionnaire    Fear of Current or Ex-Partner: No    Emotionally Abused: No    Physically Abused: No    Sexually Abused: No   FAMILY HISTORY: Family History  Problem Relation Age of Onset   Lung cancer Mother    Heart attack Father 28   Mental illness Father    Bipolar disorder  Brother    Bipolar disorder Paternal Uncle    Bipolar disorder Paternal Uncle    ALLERGIES:  is allergic to lithium, penicillins, sulfa antibiotics, and benzonatate.  MEDICATIONS:  Current Outpatient Medications  Medication Sig Dispense Refill   amLODipine  (NORVASC ) 5 MG tablet Take 5 mg by mouth daily.     ARIPiprazole  (ABILIFY ) 10 MG tablet Take 1 tablet (10 mg total) by mouth daily. 90 tablet 0   escitalopram  (LEXAPRO ) 10 MG tablet TAKE ONE TABLET BY MOUTH ONE TIME DAILY (Patient taking differently: Take 10 mg by mouth at bedtime.) 90 tablet 3   furosemide (LASIX) 20 MG tablet Take 20 mg by mouth daily as needed for fluid or edema.     glimepiride (AMARYL) 2 MG tablet Take 2 mg by mouth every morning.     HYDROcodone -acetaminophen  (NORCO) 10-325 MG tablet Take 1 tablet by mouth every 8 (eight) hours as needed for severe pain (pain score 7-10). Must last 30 days. 90 tablet 0   lamoTRIgine  (LAMICTAL ) 200 MG tablet TAKE ONE TABLET BY MOUTH ONE TIME DAILY 90 tablet 3   lamoTRIgine  (LAMICTAL ) 25 MG tablet Take 1 tablet (25 mg total) by mouth daily. Take daily with 200 mg , total of 225 mg 90 tablet 0   meloxicam (MOBIC) 7.5 MG tablet Take 7.5 mg by mouth.     metFORMIN (GLUCOPHAGE) 500 MG tablet Take 500 mg by mouth 2 (two) times daily  with a meal.     OZEMPIC, 1 MG/DOSE, 4 MG/3ML SOPN Inject 1 mg into the skin once a week. Sundays     pantoprazole  (PROTONIX ) 40 MG tablet Take 40 mg by mouth every morning.     polyethylene glycol powder (GLYCOLAX/MIRALAX) 17 GM/SCOOP powder Take 17 g by mouth daily.     spironolactone (ALDACTONE) 25 MG tablet Take 25 mg by mouth daily.     tizanidine (ZANAFLEX) 2 MG capsule Take 2 mg by mouth 3 (three) times daily as needed for muscle spasms. Takes differently     XIFAXAN  550 MG TABS tablet Take 550 mg by mouth 2 (two) times daily.     No current facility-administered medications for this visit.    PHYSICAL EXAMINATION: Vitals:   05/12/24 1434  BP: (!) 124/45  Pulse: 69  Resp: 18  Temp: 98.5 F (36.9 C)  SpO2: 100%   Filed Weights   05/12/24 1434  Weight: 179 lb (81.2 kg)   Physical Exam Vitals reviewed.  Constitutional:      Appearance: She is not ill-appearing.  Eyes:     General: No scleral icterus. Cardiovascular:     Rate and Rhythm: Normal rate and regular rhythm.  Pulmonary:     Effort: No respiratory distress.  Abdominal:     General: There is no distension.     Palpations: Abdomen is soft.     Tenderness: There is no abdominal tenderness. There is no guarding.  Musculoskeletal:     Comments: Walking cast rle  Skin:    General: Skin is warm and dry.  Neurological:     Mental Status: She is alert and oriented to person, place, and time.  Psychiatric:        Mood and Affect: Mood normal.        Behavior: Behavior normal.     LABORATORY DATA:  I have reviewed the data as listed Lab Results  Component Value Date   WBC 6.6 04/13/2024   HGB 8.8 (L) 05/12/2024   HCT 28.5 (L) 05/12/2024  MCV 83.0 04/13/2024   PLT 104 (L) 04/13/2024   Iron /TIBC/Ferritin/ %Sat    Component Value Date/Time   IRON  46 02/14/2024 2144   IRON  37 12/06/2019 1507   TIBC 581 (H) 02/14/2024 2144   TIBC 428 12/06/2019 1507   FERRITIN 8 (L) 02/14/2024 2144   FERRITIN 45  12/06/2019 1507   IRONPCTSAT 8 (L) 02/14/2024 2144   IRONPCTSAT 9 (LL) 12/06/2019 1507   No results found.   ASSESSMENT & PLAN:   Symptomatic anemia # [2020] History of iron  deficiency anemia secondary to chronic GI bleed/cirrhosis; GAVE [EGD in G+FEB 2024].  FEB 2024- Iron  sat 9 Ferritin 45- wnl-PCP; Hb 9-10.  s/p IV iron  x4 [JAN 2024]- MM panel-WNL.   # April 2025- Hmg 5.1. Ferritin 8, Iron  sat 8%. S/p hospitalization and transfusions. 3 weeks ago hmg dropped to 6.9. She received venofer  and one unit pRBCs.   # Today, hemogloibin 8.8. Stable. Proceed with venofer .     # Etiology: Cirrhosis/GI bleed FEB 2024- GAVE -non-bleeding-; colo-? Capsule study.  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 Given recent drop in hemoglobin and hospitalization she underwent colonoscopy, endoscopy, and capsule (not resulted) with Dr. Jinny which showed multiple angiodysplastic lesions, treated with APC, 3 small polyps removed, non-bleeding internal hemorrhoids, esophagitis. Pathology of esophagus was consistent with reflux. Descending colon polyps were tubular adenomas without high grade dysplasia. Sigmoid colon polyp was hyperplastic.   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 Stable.    # Liver cirrhosis, splenomegaly, small volume ascites.  Patient does not drink alcohol. NAFLD. Followed by GI. 10/2023 US  - cirrhosis, retroperitoneal LNs. January 2025 MRI Abdomen w/wo contrast- chronic liver disease with micronodular liver with fibrosis, ascites, left sided varices. No aggressive appearing masses. Prominent upper abdominal retroperitoneal nodes similar to 2024 MRI. AFP 12/29/22 was 2.5/stable. Defer to GI for HCC screening.   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 More complex lesion along mid right kidney is stable. Surveillance in 6 months recommended. Defer to GI.    #  Weight loss sec to oezmpic [40 pounds]- stable. Low concerns for malignancy at this time.    DISPOSITION: Venofer  today 1 week (prefers Friday)- lab (H&H, hold tube) & venofer  2 weeks (prefers Friday)- lab (H&H, hold tube), venofer  3 weeks - lab (H&H) 4 weeks- lab (cbc, ferritin, iron  studies, hold tube), see me, + venofer , D2 poss transfusion- la  No problem-specific Assessment & Plan notes found for this encounter.  All questions were answered. The patient knows to call the clinic with any problems, questions or concerns.  Tinnie KANDICE Dawn, NP 05/12/2024

## 2024-05-12 NOTE — Progress Notes (Signed)
 Patient is doing ok, no new questions for today.

## 2024-05-17 ENCOUNTER — Other Ambulatory Visit: Payer: Self-pay | Admitting: Internal Medicine

## 2024-05-17 DIAGNOSIS — D649 Anemia, unspecified: Secondary | ICD-10-CM

## 2024-05-19 ENCOUNTER — Inpatient Hospital Stay

## 2024-05-19 ENCOUNTER — Other Ambulatory Visit: Payer: Self-pay

## 2024-05-19 VITALS — BP 118/48 | HR 67 | Temp 97.1°F | Resp 18

## 2024-05-19 DIAGNOSIS — D649 Anemia, unspecified: Secondary | ICD-10-CM

## 2024-05-19 DIAGNOSIS — D500 Iron deficiency anemia secondary to blood loss (chronic): Secondary | ICD-10-CM

## 2024-05-19 DIAGNOSIS — R233 Spontaneous ecchymoses: Secondary | ICD-10-CM

## 2024-05-19 LAB — HEMOGLOBIN AND HEMATOCRIT (CANCER CENTER ONLY)
HCT: 26.1 % — ABNORMAL LOW (ref 36.0–46.0)
Hemoglobin: 8 g/dL — ABNORMAL LOW (ref 12.0–15.0)

## 2024-05-19 LAB — PROTIME-INR
INR: 1.2 (ref 0.8–1.2)
Prothrombin Time: 16.3 s — ABNORMAL HIGH (ref 11.4–15.2)

## 2024-05-19 LAB — SAMPLE TO BLOOD BANK

## 2024-05-19 MED ORDER — IRON SUCROSE 20 MG/ML IV SOLN
200.0000 mg | Freq: Once | INTRAVENOUS | Status: AC
  Administered 2024-05-19: 200 mg via INTRAVENOUS
  Filled 2024-05-19: qty 10

## 2024-05-20 LAB — AFP TUMOR MARKER: AFP, Serum, Tumor Marker: 1.9 ng/mL (ref 0.0–9.2)

## 2024-05-22 ENCOUNTER — Other Ambulatory Visit: Payer: Self-pay | Admitting: Gastroenterology

## 2024-05-22 DIAGNOSIS — K7460 Unspecified cirrhosis of liver: Secondary | ICD-10-CM

## 2024-05-23 ENCOUNTER — Other Ambulatory Visit: Payer: Self-pay | Admitting: Family Medicine

## 2024-05-23 ENCOUNTER — Encounter: Payer: Self-pay | Admitting: Nurse Practitioner

## 2024-05-23 ENCOUNTER — Ambulatory Visit: Attending: Nurse Practitioner | Admitting: Nurse Practitioner

## 2024-05-23 VITALS — BP 120/36 | HR 54 | Temp 97.7°F | Resp 18 | Ht 60.0 in | Wt 180.0 lb

## 2024-05-23 DIAGNOSIS — G894 Chronic pain syndrome: Secondary | ICD-10-CM | POA: Insufficient documentation

## 2024-05-23 DIAGNOSIS — M5416 Radiculopathy, lumbar region: Secondary | ICD-10-CM | POA: Insufficient documentation

## 2024-05-23 DIAGNOSIS — Z96642 Presence of left artificial hip joint: Secondary | ICD-10-CM | POA: Insufficient documentation

## 2024-05-23 DIAGNOSIS — M47816 Spondylosis without myelopathy or radiculopathy, lumbar region: Secondary | ICD-10-CM | POA: Diagnosis not present

## 2024-05-23 DIAGNOSIS — Z79891 Long term (current) use of opiate analgesic: Secondary | ICD-10-CM | POA: Insufficient documentation

## 2024-05-23 DIAGNOSIS — M170 Bilateral primary osteoarthritis of knee: Secondary | ICD-10-CM | POA: Diagnosis not present

## 2024-05-23 DIAGNOSIS — R928 Other abnormal and inconclusive findings on diagnostic imaging of breast: Secondary | ICD-10-CM

## 2024-05-23 DIAGNOSIS — G8929 Other chronic pain: Secondary | ICD-10-CM | POA: Insufficient documentation

## 2024-05-23 MED ORDER — HYDROCODONE-ACETAMINOPHEN 10-325 MG PO TABS: 1.0000 | ORAL_TABLET | Freq: Three times a day (TID) | ORAL | 0 refills | Status: AC | PRN

## 2024-05-23 MED ORDER — HYDROCODONE-ACETAMINOPHEN 10-325 MG PO TABS: 1.0000 | ORAL_TABLET | Freq: Three times a day (TID) | ORAL | 0 refills | Status: DC | PRN

## 2024-05-23 NOTE — Progress Notes (Signed)
 PROVIDER NOTE: Interpretation of information contained herein should be left to medically-trained personnel. Specific patient instructions are provided elsewhere under Patient Instructions section of medical record. This document was created in part using AI and STT-dictation technology, any transcriptional errors that may result from this process are unintentional.  Patient: Margaret [name]  Service: E/M   PCP: Cleotilde Oneil FALCON, MD  DOB: 18-Apr-1956  DOS: 05/23/2024  Provider: Emmy MARLA Blanch, NP  MRN: [id number]  Delivery: Face-to-face  Specialty: Interventional Pain Management  Type: Established Patient  Setting: Ambulatory outpatient facility  Specialty designation: 09  Referring Prov.: Cleotilde Oneil FALCON, MD  Location: Outpatient office facility       History of present illness (HPI) Margaret [name], a 68 y.o. year old female, is here today because of her Bilateral primary osteoarthritis of knee [M17.0]. Ms. England primary complain today is Hip Pain (left)  Pertinent problems: Ms. Render has Chronic pain syndrome; Encounter for long-term opioid analgesics use; Bilateral primary osteoarthritis of knee; SI (sacroiliac) joint dysfunction; History of left hip replacement; Recurrent major depressive episodes, moderate (HCC); Cervical radicular pain; Lumbar facet arthropathy; Chronic right shoulder pain; and Cervical facet joint syndrome on the pertinent problem list.  Pain Assessment: Severity of Chronic pain is reported as a 6 /10. Location: Hip Left/denies. Onset: More than a month ago. Quality: Sharp. Timing: Intermittent. Modifying factor(s): Hydrocodone , Tylenol . Vitals:  height is 5' (1.524 m) and weight is 180 lb (81.6 kg). Her temporal temperature is 97.7 F (36.5 C). Her blood pressure is 120/36 (abnormal) and her pulse is 54 (abnormal). Her respiration is 18 and oxygen saturation is 98%.  BMI: Estimated body mass index is 35.15 kg/m as calculated from the following:   Height as of  this encounter: 5' (1.524 m).   Weight as of this encounter: 180 lb (81.6 kg).  Last encounter: 02/24/2024 Last procedure: 06/09/2023  Reason for encounter: evaluation for possible interventional PM therapy/treatment and medication management. No change in medical history since last visit.  Patient's pain is at baseline.  Patient continues multimodal pain regimen as prescribed.  States that it provides pain relief and improvement in functional status.   Of note, the patient experienced significant relief from a prior intra-articular hyaluronic acid injection (hyalgan) in the left knee.  She currently reports bilateral knee pain, with the right knee being more symptomatic than the left.  Given her persistent right knee pain, we discussed proceeding with an intra-articular injection for the right knee.  Pharmacotherapy Assessment   Hydrocodone -acetaminophen  (Norco) 10-325 mg every 8 hours as needed for severe pain. MME=30  Monitoring: [place] PMP: PDMP reviewed during this encounter.       Pharmacotherapy: No side-effects or adverse reactions reported. Compliance: No problems identified. Effectiveness: Clinically acceptable.  [name], RN  05/23/2024  1:38 PM  Sign when Signing Visit Nursing Pain Medication Assessment:  Safety precautions to be maintained throughout the outpatient stay will include: orient to surroundings, keep bed in low position, maintain call bell within reach at all times, provide assistance with transfer out of bed and ambulation.  Medication Inspection Compliance: Pill count conducted under aseptic conditions, in front of the patient. Neither the pills nor the bottle was removed from the patient's sight at any time. Once count was completed pills were immediately returned to the patient in their original bottle.  Medication: Hydrocodone /APAP Pill/Patch Count: 36 of 90 pills/patches remain Pill/Patch Appearance: Markings consistent with prescribed medication Bottle  Appearance: Standard pharmacy container. Clearly labeled.  Filled Date: 07 / 01 / 2025 Last Medication intake:  TodaySafety precautions to be maintained throughout the outpatient stay will include: orient to surroundings, keep bed in low position, maintain call bell within reach at all times, provide assistance with transfer out of bed and ambulation.   UDS:  Summary  Date Value Ref Range Status  06/01/2023 Note  Final    Comment:    ==================================================================== [employer] Select 13 (MW) ==================================================================== Test                             Result       Flag       Units  Drug Present and Declared for Prescription Verification   Hydrocodone                     5552         EXPECTED   ng/mg creat   Hydromorphone                  124          EXPECTED   ng/mg creat   Dihydrocodeine                 554          EXPECTED   ng/mg creat   Norhydrocodone                 2725         EXPECTED   ng/mg creat    Sources of hydrocodone  include scheduled prescription medications.    Hydromorphone, dihydrocodeine and norhydrocodone are expected    metabolites of hydrocodone . Hydromorphone and dihydrocodeine are    also available as scheduled prescription medications.  ==================================================================== Test                      Result    Flag   Units      Ref Range   Creatinine              71               mg/dL      >=79 ==================================================================== Declared Medications:  The flagging and interpretation on this report are based on the  following declared medications.  Unexpected results may arise from  inaccuracies in the declared medications.   **Note: The testing scope of this panel includes these medications:   Hydrocodone  (Norco)   **Note: The testing scope of this panel does not include the  following reported medications:    Acetaminophen  (Norco)  Alendronate  Amlodipine   Aripiprazole  (Abilify )  Benztropine  (Cogentin )  Escitalopram  (Lexapro )  Furosemide (Lasix)  Glimepiride  Hydroxyzine   Meclizine (Antivert)  Metformin  Pantoprazole  (Protonix )  Polyethylene Glycol (MiraLAX)  Rifaximin  (Xifaxan )  Semaglutide (Ozempic)  Spironolactone  Tizanidine ==================================================================== For clinical consultation, please call [phone]. ====================================================================     No results found for: CBDTHCR No results found for: D8THCCBX No results found for: D9THCCBX  ROS  Constitutional: Denies any fever or chills Gastrointestinal: No reported hemesis, hematochezia, vomiting, or acute GI distress Musculoskeletal: Hip pain, bilateral knee pain (R>L) Neurological: No reported episodes of acute onset apraxia, aphasia, dysarthria, agnosia, amnesia, paralysis, loss of coordination, or loss of consciousness  Medication Review  ARIPiprazole , HYDROcodone -acetaminophen , Semaglutide (1 MG/DOSE), amLODipine , escitalopram , furosemide, glimepiride, lamoTRIgine , meloxicam, metFORMIN, pantoprazole , polyethylene glycol powder, rifaximin , spironolactone, and tizanidine  History Review  Allergy: Ms. Gaddie is allergic to  lithium, penicillins, sulfa antibiotics, and benzonatate. Drug: Ms. Ebert  reports that she does not currently use drugs. Alcohol:  reports current alcohol use. Tobacco:  reports that she quit smoking about 13 years ago. Her smoking use included cigarettes. She has never used smokeless tobacco. Social: Ms. Cerros  reports that she quit smoking about 13 years ago. Her smoking use included cigarettes. She has never used smokeless tobacco. She reports current alcohol use. She reports that she does not currently use drugs. Medical:  has a past medical history of (HFpEF) heart failure with preserved ejection fraction (HCC),  Anxiety, Aortic atherosclerosis (HCC), Aortic stenosis, Bilateral carotid artery disease (HCC), Bilateral renal cysts, Bipolar 1 disorder (HCC), CAD (coronary artery disease) (11/04/2022), Cardiac murmur, CKD (chronic kidney disease), stage III (HCC), DDD (degenerative disc disease), lumbar, Depression, DM (diabetes mellitus), type 2 (HCC), Former smoker, Frequent falls, GERD (gastroesophageal reflux disease), H/O: GI bleed, Heart murmur, History of blood transfusion, History of MRSA infection (2017), HLD (hyperlipidemia), Hypertension, IDA (iron  deficiency anemia), 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H [facility]), Multiple pulmonary nodules, Nephrolithiasis, Occasional tremors, Osteoarthritis, Osteoporosis, Portal hypertensive gastropathy (HCC), Portal venous hypertension (HCC), Renal mass, 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 Surgical: Ms. Ibbotson  has a past surgical history that includes Abdominal hysterectomy; Tonsillectomy; Cholecystectomy; Esophagogastroduodenoscopy (egd) with propofol  (N/A, 06/23/2019); Colonoscopy with propofol  (N/A, 06/23/2019); Esophagogastroduodenoscopy (egd) with propofol  (N/A, 12/08/2022); Colonoscopy with propofol  (N/A, 12/08/2022); Esophagogastroduodenoscopy (egd) with propofol  (N/A, 01/12/2023); Colonoscopy with propofol  (N/A, 05/18/2023); polypectomy (05/18/2023); IR Radiologist Eval & Mgmt (10/28/2023); Total hip arthroplasty (Right, 2017); IR Radiologist Eval & Mgmt (01/18/2024); Esophagogastroduodenoscopy (N/A, 02/15/2024); Colonoscopy (N/A, 02/15/2024); Colonoscopy (02/15/2024); Givens capsule study (02/15/2024); Polypectomy (02/15/2024); and Hot hemostasis (02/15/2024). Family: family history includes Bipolar disorder in her brother, paternal uncle, and paternal uncle; Heart attack (age of onset: 10) in her father; Lung cancer in her mother; Mental illness in her  father.  Laboratory Chemistry Profile   Renal Lab Results  Component Value Date   BUN 32 (H) 04/13/2024   CREATININE 1.32 (H) 04/13/2024   BCR 19 12/06/2019   GFRAA 114 12/06/2019   GFRNONAA 45 (L) 04/13/2024    Hepatic Lab Results  Component Value Date   AST 45 (H) 02/15/2024   ALT 24 02/15/2024   ALBUMIN  3.4 (L) 02/15/2024   ALKPHOS 83 02/15/2024    Electrolytes Lab Results  Component Value Date   NA 133 (L) 04/13/2024   K 4.5 04/13/2024   CL 100 04/13/2024   CALCIUM 8.6 (L) 04/13/2024    Bone No results found for: VD25OH, [id number], CI6874NY7, CI7874NY7, 25OHVITD1, 25OHVITD2, 25OHVITD3, TESTOFREE, TESTOSTERONE  Inflammation (CRP: Acute Phase) (ESR: Chronic Phase) No results found for: CRP, ESRSEDRATE, LATICACIDVEN       Note: Above Lab results reviewed.  Recent Imaging Review  MM 3D SCREENING MAMMOGRAM BILATERAL BREAST CLINICAL DATA:  Screening.  EXAM: DIGITAL SCREENING BILATERAL MAMMOGRAM WITH TOMOSYNTHESIS AND CAD  TECHNIQUE: Bilateral screening digital craniocaudal and mediolateral oblique mammograms were obtained. Bilateral screening digital breast tomosynthesis was performed. The images were evaluated with computer-aided detection.  COMPARISON:  None available.  ACR Breast Density Category b: There are scattered areas of fibroglandular density.  FINDINGS: In the right breast, a possible mass warrants further evaluation. In the left breast, no findings suspicious for malignancy.  IMPRESSION: Further evaluation is suggested for a possible mass in the right breast.  RECOMMENDATION: Diagnostic mammogram and possibly ultrasound of the right breast. (Code:[id number])  The patient will be contacted regarding the findings, and additional imaging will be scheduled.  BI-RADS CATEGORY  0: Incomplete: Need additional imaging evaluation.  Electronically Signed   By: Alm Parkins M.D.   On: 05/18/2024 07:27 Note: Reviewed         Physical Exam  Vitals: BP (!) 120/36 (Cuff Size: Large)   Pulse (!) 54   Temp 97.7 F (36.5 C) (Temporal)   Resp 18   Ht 5' (1.524 m)   Wt 180 lb (81.6 kg)   SpO2 98%   BMI 35.15 kg/m  BMI: Estimated body mass index is 35.15 kg/m as calculated from the following:   Height as of this encounter: 5' (1.524 m).   Weight as of this encounter: 180 lb (81.6 kg). Ideal: Ideal body weight: 45.5 kg (100 lb 4.9 oz) Adjusted ideal body weight: 60 kg (132 lb 3 oz) General appearance: Well nourished, well developed, and well hydrated. In no apparent acute distress Mental status: Alert, oriented x 3 (person, place, & time)       Respiratory: No evidence of acute respiratory distress Eyes: PERLA  Lumbar Spine Area Exam  Skin & Axial Inspection: No masses, redness, or swelling Alignment: Symmetrical Functional ROM: Pain restricted ROM       Stability: No instability detected Muscle Tone/Strength: Functionally intact. No obvious neuro-muscular anomalies detected. Sensory (Neurological): Musculoskeletal pain pattern   Lower Extremity Exam      Side: Right lower extremity   Side: Left lower extremity  Stability: No instability observed           Stability: No instability observed          Skin & Extremity Inspection: Skin color, temperature, and hair growth are WNL. No peripheral edema or cyanosis. No masses, redness, swelling, asymmetry, or associated skin lesions. No contractures.   Skin & Extremity Inspection: left ankle swelling  Functional ROM: Unrestricted ROM                   Functional ROM: Pain restricted ROM left knee          Muscle Tone/Strength: Functionally intact. No obvious neuro-muscular anomalies detected.   Muscle Tone/Strength: Functionally intact. No obvious neuro-muscular anomalies detected.  Sensory (Neurological): Unimpaired         Sensory (Neurological): Arthropathic arthralgia        DTR: Patellar: deferred today Achilles: deferred today Plantar: deferred today    DTR: Patellar: deferred today Achilles: deferred today Plantar: deferred today  Palpation: No palpable anomalies   Palpation: No palpable anomalies   Assessment   Diagnosis Status  1. Bilateral primary osteoarthritis of knee   2. Encounter for long-term opiate analgesic use   3. History of left hip replacement (2017)   4. Lumbar facet arthropathy   5. Chronic radicular lumbar pain   6. Chronic pain syndrome    Persistent Controlled Controlled   Updated Problems: No problems updated.  Plan of Care  Problem-specific:  Assessment and Plan We will continue on current medication regimen.  Prescribing drug monitoring (PDMP) reviewed; findings consistent with the use of prescribed medication and no evidence of narcotic misuse or abuse. Routine UDS ordered today.  Schedule follow-up in 90 days for medication management with Emmy Blanch NP.  Plan: Knee injection  (Hyalagan) (Right side) with Dr. Marcelino   Ms. Margaret Hammonds Zeis has a current medication list which includes the following long-term medication(s): aripiprazole , escitalopram , furosemide, lamotrigine , lamotrigine , metformin, pantoprazole , and spironolactone.  Pharmacotherapy (Medications Ordered):  Meds ordered this encounter  Medications   HYDROcodone -acetaminophen  (NORCO) 10-325 MG tablet    Sig: Take 1 tablet by mouth every 8 (eight) hours as needed for severe pain (pain score 7-10). Must last 30 days.    Dispense:  90 tablet    Refill:  0    Chronic Pain: STOP Act (Not applicable) Fill 1 day early if closed on refill date. Avoid benzodiazepines within 8 hours of opioids   HYDROcodone -acetaminophen  (NORCO) 10-325 MG tablet    Sig: Take 1 tablet by mouth every 8 (eight) hours as needed for severe pain (pain score 7-10). Must last 30 days.    Dispense:  90 tablet    Refill:  0    Chronic Pain: STOP Act (Not applicable) Fill 1 day early if closed on refill date. Avoid benzodiazepines within 8 hours of opioids    HYDROcodone -acetaminophen  (NORCO) 10-325 MG tablet    Sig: Take 1 tablet by mouth every 8 (eight) hours as needed for severe pain (pain score 7-10). Must last 30 days.    Dispense:  90 tablet    Refill:  0    Chronic Pain: STOP Act (Not applicable) Fill 1 day early if closed on refill date. Avoid benzodiazepines within 8 hours of opioids   Orders:  Orders Placed This Encounter  Procedures   KNEE INJECTION    Indications: Knee arthralgia (pain) due to osteoarthritis (OA) Imaging: None (CPT-20610) Nursing: Please request pharmacy to have Hyalgan (Hyaluronan/Hyaluronic acid) available in the office. Comparable to Euflexxa or Triluron.    Standing Status:   Future    Expected Date:   06/06/2024    Expiration Date:   08/23/2024    Scheduling Instructions:     Procedure: Knee injection Hyalgan (Hyaluronan/Hyaluronic acid)     Treatment No.4           Level: Intra-articular     Laterality: Right Knee     Sedation: Patient's choice.     Timeframe: As soon as schedule allows     Total Hyaluronic acid (AKA hyaluronan or hyaluronate) per injection: 40 mg (20.0 mg/2 mL). Series of 5 injections.    Where will this procedure be performed?:   [facility]   [employer] Select 13 (MW), Urine    Volume: 30 ml(s). Minimum 3 ml of urine is needed. Document temperature of fresh sample. Indications: Long term (current) use of opiate analgesic (S20.108)    Release to patient:   Immediate        Return in about 3 months (around 08/23/2024) for (F2F), (MM), Emmy Blanch NP.    Recent Visits Date Type Provider Dept  02/24/24 Office Visit Marcelino Nurse, MD [facility]  Showing recent visits within past 90 days and meeting all other requirements Today's Visits Date Type Provider Dept  05/23/24 Office Visit Mosetta Ferdinand K, NP [facility]  Showing today's visits and meeting all other requirements Future Appointments No visits were found meeting these conditions. Showing  future appointments within next 90 days and meeting all other requirements  I discussed the assessment and treatment plan with the patient. The patient was provided an opportunity to ask questions and all were answered. The patient agreed with the plan and demonstrated an understanding of the instructions.  Patient advised to call back or seek an in-person evaluation if the symptoms or condition worsens.  Duration of encounter: 30 minutes.  Total time on encounter, as per AMA guidelines included both the face-to-face and non-face-to-face time personally  spent by the physician and/or other qualified health care professional(s) on the day of the encounter (includes time in activities that require the physician or other qualified health care professional and does not include time in activities normally performed by clinical staff). Physician's time may include the following activities when performed: Preparing to see the patient (e.g., pre-charting review of records, searching for previously ordered imaging, lab work, and nerve conduction tests) Review of prior analgesic pharmacotherapies. Reviewing PMP Interpreting ordered tests (e.g., lab work, imaging, nerve conduction tests) Performing post-procedure evaluations, including interpretation of diagnostic procedures Obtaining and/or reviewing separately obtained history Performing a medically appropriate examination and/or evaluation Counseling and educating the patient/family/caregiver Ordering medications, tests, or procedures Referring and communicating with other health care professionals (when not separately reported) Documenting clinical information in the electronic or other health record Independently interpreting results (not separately reported) and communicating results to the patient/ family/caregiver Care coordination (not separately reported)  Note by: Rickey Farrier K Ronit Cranfield, NP (TTS and AI technology used. I apologize for any typographical  errors that were not detected and corrected.) Date: 05/23/2024; Time: 2:00 PM

## 2024-05-23 NOTE — Patient Instructions (Signed)

## 2024-05-23 NOTE — Progress Notes (Signed)
 Nursing Pain Medication Assessment:  Safety precautions to be maintained throughout the outpatient stay will include: orient to surroundings, keep bed in low position, maintain call bell within reach at all times, provide assistance with transfer out of bed and ambulation.  Medication Inspection Compliance: Pill count conducted under aseptic conditions, in front of the patient. Neither the pills nor the bottle was removed from the patient's sight at any time. Once count was completed pills were immediately returned to the patient in their original bottle.  Medication: Hydrocodone /APAP Pill/Patch Count: 36 of 90 pills/patches remain Pill/Patch Appearance: Markings consistent with prescribed medication Bottle Appearance: Standard pharmacy container. Clearly labeled. Filled Date: 07 / 01 / 2025 Last Medication intake:  TodaySafety precautions to be maintained throughout the outpatient stay will include: orient to surroundings, keep bed in low position, maintain call bell within reach at all times, provide assistance with transfer out of bed and ambulation.

## 2024-05-24 ENCOUNTER — Other Ambulatory Visit: Payer: Self-pay | Admitting: Gastroenterology

## 2024-05-24 ENCOUNTER — Encounter: Payer: Self-pay | Admitting: Internal Medicine

## 2024-05-24 DIAGNOSIS — K7460 Unspecified cirrhosis of liver: Secondary | ICD-10-CM

## 2024-05-24 DIAGNOSIS — D49519 Neoplasm of unspecified behavior of unspecified kidney: Secondary | ICD-10-CM

## 2024-05-25 ENCOUNTER — Ambulatory Visit

## 2024-05-25 ENCOUNTER — Other Ambulatory Visit

## 2024-05-25 ENCOUNTER — Ambulatory Visit: Admitting: Nurse Practitioner

## 2024-05-26 ENCOUNTER — Inpatient Hospital Stay

## 2024-05-26 ENCOUNTER — Ambulatory Visit
Admission: RE | Admit: 2024-05-26 | Discharge: 2024-05-26 | Disposition: A | Discharge status: Home / Self Care | Source: Ambulatory Visit | Attending: Family Medicine | Admitting: Family Medicine

## 2024-05-26 ENCOUNTER — Other Ambulatory Visit

## 2024-05-26 ENCOUNTER — Encounter

## 2024-05-26 ENCOUNTER — Ambulatory Visit

## 2024-05-26 VITALS — BP 118/42 | HR 68 | Temp 99.1°F | Resp 16

## 2024-05-26 DIAGNOSIS — D500 Iron deficiency anemia secondary to blood loss (chronic): Secondary | ICD-10-CM | POA: Diagnosis not present

## 2024-05-26 DIAGNOSIS — R928 Other abnormal and inconclusive findings on diagnostic imaging of breast: Secondary | ICD-10-CM | POA: Insufficient documentation

## 2024-05-26 DIAGNOSIS — D649 Anemia, unspecified: Secondary | ICD-10-CM

## 2024-05-26 LAB — HEMOGLOBIN AND HEMATOCRIT (CANCER CENTER ONLY)
HCT: 25.2 % — ABNORMAL LOW (ref 36.0–46.0)
Hemoglobin: 7.8 g/dL — ABNORMAL LOW (ref 12.0–15.0)

## 2024-05-26 MED ORDER — IRON SUCROSE 20 MG/ML IV SOLN
200.0000 mg | Freq: Once | INTRAVENOUS | Status: AC
  Administered 2024-05-26: 200 mg via INTRAVENOUS
  Filled 2024-05-26: qty 10

## 2024-05-26 NOTE — Progress Notes (Signed)
 Declined post-observation. Aware of risks. Vitals stable at discharge.

## 2024-05-26 NOTE — Patient Instructions (Signed)

## 2024-05-27 LAB — SAMPLE TO BLOOD BANK

## 2024-05-29 ENCOUNTER — Telehealth: Payer: Self-pay | Admitting: *Deleted

## 2024-05-29 NOTE — Telephone Encounter (Signed)
 Daughter says to tell her if she is getting lab and infusion or not . I told her that she has lab only and she has 8/1 appt and daughter needs Thursday and we changed it to 7/31  at 2 pm. Daughter is with the time and date.

## 2024-05-30 LAB — TOXASSURE SELECT 13 (MW), URINE

## 2024-05-31 ENCOUNTER — Inpatient Hospital Stay

## 2024-05-31 ENCOUNTER — Other Ambulatory Visit

## 2024-05-31 DIAGNOSIS — D500 Iron deficiency anemia secondary to blood loss (chronic): Secondary | ICD-10-CM | POA: Diagnosis not present

## 2024-05-31 DIAGNOSIS — D649 Anemia, unspecified: Secondary | ICD-10-CM

## 2024-05-31 LAB — SAMPLE TO BLOOD BANK

## 2024-05-31 LAB — HEMOGLOBIN AND HEMATOCRIT (CANCER CENTER ONLY)
HCT: 25.4 % — ABNORMAL LOW (ref 36.0–46.0)
Hemoglobin: 7.9 g/dL — ABNORMAL LOW (ref 12.0–15.0)

## 2024-06-01 ENCOUNTER — Ambulatory Visit: Admitting: Cardiology

## 2024-06-01 ENCOUNTER — Inpatient Hospital Stay

## 2024-06-01 VITALS — BP 125/52 | HR 61 | Temp 98.6°F | Resp 18

## 2024-06-01 DIAGNOSIS — D500 Iron deficiency anemia secondary to blood loss (chronic): Secondary | ICD-10-CM

## 2024-06-01 MED ORDER — IRON SUCROSE 20 MG/ML IV SOLN
200.0000 mg | Freq: Once | INTRAVENOUS | Status: AC
Start: 2024-06-01 — End: 2024-06-01
  Administered 2024-06-01: 200 mg via INTRAVENOUS
  Filled 2024-06-01: qty 10

## 2024-06-01 MED ORDER — SODIUM CHLORIDE 0.9% FLUSH
10.0000 mL | Freq: Once | INTRAVENOUS | Status: AC | PRN
  Filled 2024-06-01: qty 10

## 2024-06-02 ENCOUNTER — Other Ambulatory Visit

## 2024-06-06 ENCOUNTER — Ambulatory Visit

## 2024-06-08 ENCOUNTER — Ambulatory Visit
Admission: RE | Admit: 2024-06-08 | Discharge: 2024-06-08 | Disposition: A | Discharge status: Home / Self Care | Source: Ambulatory Visit | Attending: Gastroenterology | Admitting: Gastroenterology

## 2024-06-08 DIAGNOSIS — D49519 Neoplasm of unspecified behavior of unspecified kidney: Secondary | ICD-10-CM | POA: Insufficient documentation

## 2024-06-08 DIAGNOSIS — K7460 Unspecified cirrhosis of liver: Secondary | ICD-10-CM | POA: Insufficient documentation

## 2024-06-08 MED ORDER — GADOBUTROL 1 MMOL/ML IV SOLN
8.0000 mL | Freq: Once | INTRAVENOUS | Status: AC | PRN
  Administered 2024-06-08: 8 mL via INTRAVENOUS

## 2024-06-09 ENCOUNTER — Other Ambulatory Visit

## 2024-06-09 ENCOUNTER — Ambulatory Visit

## 2024-06-09 ENCOUNTER — Ambulatory Visit: Admitting: Nurse Practitioner

## 2024-06-12 ENCOUNTER — Ambulatory Visit: Admitting: Student in an Organized Health Care Education/Training Program

## 2024-06-12 ENCOUNTER — Encounter

## 2024-06-13 ENCOUNTER — Ambulatory Visit

## 2024-06-13 ENCOUNTER — Other Ambulatory Visit

## 2024-06-13 ENCOUNTER — Ambulatory Visit: Admitting: Nurse Practitioner

## 2024-06-13 ENCOUNTER — Inpatient Hospital Stay

## 2024-06-13 ENCOUNTER — Inpatient Hospital Stay: Attending: Internal Medicine

## 2024-06-13 ENCOUNTER — Inpatient Hospital Stay (HOSPITAL_BASED_OUTPATIENT_CLINIC_OR_DEPARTMENT_OTHER): Admitting: Nurse Practitioner

## 2024-06-13 VITALS — BP 123/44 | HR 68 | Temp 97.4°F | Resp 16 | Wt 182.0 lb

## 2024-06-13 VITALS — BP 126/48 | HR 66

## 2024-06-13 DIAGNOSIS — D500 Iron deficiency anemia secondary to blood loss (chronic): Secondary | ICD-10-CM

## 2024-06-13 DIAGNOSIS — K922 Gastrointestinal hemorrhage, unspecified: Secondary | ICD-10-CM | POA: Diagnosis present

## 2024-06-13 DIAGNOSIS — Z79899 Other long term (current) drug therapy: Secondary | ICD-10-CM | POA: Insufficient documentation

## 2024-06-13 DIAGNOSIS — D649 Anemia, unspecified: Secondary | ICD-10-CM

## 2024-06-13 LAB — CBC WITH DIFFERENTIAL (CANCER CENTER ONLY)
Abs Immature Granulocytes: 0.02 K/uL (ref 0.00–0.07)
Basophils Absolute: 0 K/uL (ref 0.0–0.1)
Basophils Relative: 0 %
Eosinophils Absolute: 0.1 K/uL (ref 0.0–0.5)
Eosinophils Relative: 2 %
HCT: 21.6 % — ABNORMAL LOW (ref 36.0–46.0)
Hemoglobin: 6.6 g/dL — CL (ref 12.0–15.0)
Immature Granulocytes: 1 %
Lymphocytes Relative: 15 %
Lymphs Abs: 0.6 K/uL — ABNORMAL LOW (ref 0.7–4.0)
MCH: 27.3 pg (ref 26.0–34.0)
MCHC: 30.6 g/dL (ref 30.0–36.0)
MCV: 89.3 fL (ref 80.0–100.0)
Monocytes Absolute: 0.4 K/uL (ref 0.1–1.0)
Monocytes Relative: 9 %
Neutro Abs: 3 K/uL (ref 1.7–7.7)
Neutrophils Relative %: 73 %
Platelet Count: 101 K/uL — ABNORMAL LOW (ref 150–400)
RBC: 2.42 MIL/uL — ABNORMAL LOW (ref 3.87–5.11)
RDW: 17.6 % — ABNORMAL HIGH (ref 11.5–15.5)
WBC Count: 4 K/uL (ref 4.0–10.5)
nRBC: 0 % (ref 0.0–0.2)

## 2024-06-13 LAB — IRON AND TIBC
Iron: 22 ug/dL — ABNORMAL LOW (ref 28–170)
Saturation Ratios: 5 % — ABNORMAL LOW (ref 10.4–31.8)
TIBC: 475 ug/dL — ABNORMAL HIGH (ref 250–450)
UIBC: 453 ug/dL

## 2024-06-13 LAB — FERRITIN: Ferritin: 55 ng/mL (ref 11–307)

## 2024-06-13 MED ORDER — IRON SUCROSE 20 MG/ML IV SOLN
200.0000 mg | Freq: Once | INTRAVENOUS | Status: AC
Start: 2024-06-13 — End: 2024-06-13
  Administered 2024-06-13 (×2): 200 mg via INTRAVENOUS
  Filled 2024-06-13: qty 10

## 2024-06-13 NOTE — Progress Notes (Signed)
 [facility] CONSULT NOTE  Patient Care Team: Cleotilde Oneil FALCON, MD as PCP - General (Internal Medicine) Rennie Cindy SAUNDERS, MD as Consulting Physician (Internal Medicine) Maryruth Ole DASEN, MD as Consulting Physician (Gastroenterology)  CHIEF COMPLAINTS/PURPOSE OF CONSULTATION: Anemia/thrombocytopenia  HEMATOLOGY HISTORY  06/21/2019-06/24/2019 due to severe anemia; hemoglobin of 5.7 upon admission; patient received 2 units of blood transfusion, 3 doses of IV iron . EGD and colonoscopy were performed during the hospitalization EGD and colonoscopy on 06/23/2019. [Dr.Anna; [facility]] EGD showed no abnormalities and the colonoscopy showed 3 sessile diminutive polyps which were resected completely.  Arteries of the duodenum were normal.  3 colon polyps resected were serrated polyp x2 and a tubular adenoma x1. No active source of bleeding was noted.    # Cirrhosis NAFLD- [? [facility]]  She saw Dr Rennie on 02/14/24 and was found to have hemoglobin 5.1 with significant symptoms. She went to ER and was admitted. On 02/15/24, she underwent EGD with Dr Jinny which revealed LA  Grade A esophagitis w/o bleeding. Biopsied. 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 She also underwent colonoscopy which revealed single nonbleeding colonic angiodysplastic lesion treated with APC. 2 small polyps were removed. She also underwent capsule study. She received several blood transfusions. She was discharged home on 02/16/24.   HISTORY OF PRESENTING ILLNESS: ambulating with rolling walker [arthritis].   Margaret [name] 68 y.o.  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   Feeling poorly. Increasingly weak. She continues to have black/dark stools. She feels like she is bleeding. Last  received IV iron  on 06/01/24. She continues to recover from her fall and broken toes. Ongoing fatigue.    Review of Systems  Constitutional:  Positive for chills and malaise/fatigue. Negative for diaphoresis, fever and weight loss.  Respiratory:  Negative for cough, hemoptysis, sputum production, shortness of breath and wheezing.   Cardiovascular:  Negative for chest pain, palpitations, orthopnea and leg swelling.  Gastrointestinal:  Positive for melena. Negative for abdominal pain, blood in stool, constipation, diarrhea, heartburn, nausea and vomiting.  Genitourinary:  Negative for dysuria, hematuria and urgency.  Musculoskeletal:  Positive for back pain, falls and joint pain.  Skin:  Negative for itching and rash.  Neurological:  Negative for dizziness, tingling, focal weakness, weakness and headaches.  Endo/Heme/Allergies:  Does not bruise/bleed easily.  Psychiatric/Behavioral:  Negative for depression. The patient is not nervous/anxious and does not have insomnia.    MEDICAL HISTORY:  Past Medical History:  Diagnosis Date   (HFpEF) heart failure with preserved ejection fraction (HCC)    a.) TTE 12/15/2022: EF >55%, mild LVH, G1DD, mild LAE, mild MAC, triv AR/MR/TR, mild TR, RVSP 30.7, mod AS (MPG 20)   Anxiety    Aortic atherosclerosis (HCC)    Aortic stenosis    a.) TTE 12/15/2022: moderate AS (MPG 20 mmHg; AVA = 1.7 cm2)   Bilateral carotid artery disease (HCC)    a.) doppler 12/22/2020: <50% BICA   Bilateral renal cysts    Bipolar 1 disorder (HCC)    CAD (coronary artery disease) 11/04/2022   a.) CT chest 11/04/2022: 3v CAD   Cardiac murmur    CKD (chronic kidney disease), stage III (HCC)    DDD (degenerative disc disease), lumbar    Depression    DM (diabetes mellitus), type 2 (HCC)    Former smoker  Frequent falls    GERD (gastroesophageal reflux disease)    H/O: GI bleed    Heart murmur    History of blood transfusion    History of MRSA infection 2017   HLD  (hyperlipidemia)    Hypertension    IDA (iron  deficiency anemia)    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H [facility])    Multiple pulmonary nodules    Nephrolithiasis    Occasional tremors    Osteoarthritis    Osteoporosis    Portal hypertensive gastropathy (HCC)    Portal venous hypertension (HCC)    Renal mass, right 03/30/2023   a.) MRI ABD 03/30/2023: 1.2 x 1.2 cm complex cystic renal cortical mass in the posterior mid to low RIGHT kidney (Bosniak 3); b.) MRI ABD 08/03/2023: interval increase in size to 1.4 x 1.3 cm -> concerning for RCC; c.) US  ABD 10/12/2023: 1.7 complex cystic mass RIGHT kidney; d.) MRI ABD 11/15/2023: interval increase in size to 1.6 x 1.2 x 1.0 cm   Splenomegaly    Thoracic spondylosis    Thrombocytopenia (HCC)    Tubular adenoma of colon    Uses walker    Venous insufficiency    Ventral hernia    SURGICAL HISTORY: Past Surgical History:  Procedure Laterality Date   ABDOMINAL HYSTERECTOMY     CHOLECYSTECTOMY     COLONOSCOPY N/A 02/15/2024   Procedure: COLONOSCOPY;  Surgeon: Jinny Carmine, MD;  Location: [facility] ENDOSCOPY;  Service: Endoscopy;  Laterality: N/A;   COLONOSCOPY  02/15/2024   Procedure: COLONOSCOPY, WITH ARGON PLASMA COAGULATION;  Surgeon: Jinny Carmine, MD;  Location: [facility] ENDOSCOPY;  Service: Endoscopy;;   COLONOSCOPY WITH PROPOFOL  N/A 06/23/2019   Procedure: COLONOSCOPY WITH PROPOFOL ;  Surgeon: Therisa Bi, MD;  Location: [facility] ENDOSCOPY;  Service: Gastroenterology;  Laterality: N/A;   COLONOSCOPY WITH PROPOFOL  N/A 12/08/2022   Procedure: COLONOSCOPY WITH PROPOFOL ;  Surgeon: Maryruth Ole DASEN, MD;  Location: [facility] ENDOSCOPY;  Service: Endoscopy;  Laterality: N/A;   COLONOSCOPY WITH PROPOFOL  N/A 05/18/2023   Procedure: COLONOSCOPY WITH PROPOFOL ;  Surgeon: Maryruth Ole DASEN, MD;  Location: [facility] ENDOSCOPY;  Service: Endoscopy;  Laterality: N/A;   ESOPHAGOGASTRODUODENOSCOPY N/A 02/15/2024   Procedure: EGD (ESOPHAGOGASTRODUODENOSCOPY);  Surgeon: Jinny Carmine,  MD;  Location: [facility] ENDOSCOPY;  Service: Endoscopy;  Laterality: N/A;   ESOPHAGOGASTRODUODENOSCOPY (EGD) WITH PROPOFOL  N/A 06/23/2019   Procedure: ESOPHAGOGASTRODUODENOSCOPY (EGD) WITH PROPOFOL ;  Surgeon: Therisa Bi, MD;  Location: [facility] ENDOSCOPY;  Service: Gastroenterology;  Laterality: N/A;   ESOPHAGOGASTRODUODENOSCOPY (EGD) WITH PROPOFOL  N/A 12/08/2022   Procedure: ESOPHAGOGASTRODUODENOSCOPY (EGD) WITH PROPOFOL ;  Surgeon: Maryruth Ole DASEN, MD;  Location: [facility] ENDOSCOPY;  Service: Endoscopy;  Laterality: N/A;  DM, OZEMPIC   ESOPHAGOGASTRODUODENOSCOPY (EGD) WITH PROPOFOL  N/A 01/12/2023   Procedure: ESOPHAGOGASTRODUODENOSCOPY (EGD) WITH PROPOFOL ;  Surgeon: Maryruth Ole DASEN, MD;  Location: [facility] ENDOSCOPY;  Service: Endoscopy;  Laterality: N/A;  DM   GIVENS CAPSULE STUDY  02/15/2024   Procedure: IMAGING PROCEDURE, GI TRACT, INTRALUMINAL, VIA CAPSULE;  Surgeon: Jinny Carmine, MD;  Location: [facility] ENDOSCOPY;  Service: Endoscopy;;   HOT HEMOSTASIS  02/15/2024   Procedure: EGD, WITH ARGON PLASMA COAGULATION;  Surgeon: Jinny Carmine, MD;  Location: [facility] ENDOSCOPY;  Service: Endoscopy;;   IR RADIOLOGIST EVAL & MGMT  10/28/2023   IR RADIOLOGIST EVAL & MGMT  01/18/2024   POLYPECTOMY  05/18/2023   Procedure: POLYPECTOMY;  Surgeon: Maryruth Ole DASEN, MD;  Location: [facility] ENDOSCOPY;  Service: Endoscopy;;   POLYPECTOMY  02/15/2024   Procedure: POLYPECTOMY, INTESTINE;  Surgeon: Jinny Carmine, MD;  Location: [facility]  ENDOSCOPY;  Service: Endoscopy;;   TONSILLECTOMY     TOTAL HIP ARTHROPLASTY Right 2017   SOCIAL HISTORY: Social History   Socioeconomic History   Marital status: Divorced    Spouse name: Not on file   Number of children: 2   Years of education: Not on file   Highest education level: Not on file  Occupational History   Not on file  Tobacco Use   Smoking status: Former    Current packs/day: 0.00    Types: Cigarettes    Quit date: 03/03/2011    Years since quitting: 13.2   Smokeless tobacco:  Never  Vaping Use   Vaping status: Never Used  Substance and Sexual Activity   Alcohol use: Yes    Comment: rarely   Drug use: Not Currently   Sexual activity: Not Currently  Other Topics Concern   Not on file  Social History Narrative   Not on file   Social Drivers of Health   Financial Resource Strain: Low Risk  (05/04/2024)   Received from [facility]   Overall Financial Resource Strain (CARDIA)    Difficulty of Paying Living Expenses: Not hard at all  Food Insecurity: No Food Insecurity (05/04/2024)   Received from [facility]   Hunger Vital Sign    Within the past 12 months, you worried that your food would run out before you got the money to buy more.: Never true    Within the past 12 months, the food you bought just didn't last and you didn't have money to get more.: Never true  Transportation Needs: No Transportation Needs (05/04/2024)   Received from [facility] - Transportation    In the past 12 months, has lack of transportation kept you from medical appointments or from getting medications?: No    Lack of Transportation (Non-Medical): No  Physical Activity: Inactive (06/16/2021)   Received from [facility]   Exercise Vital Sign    On average, how many days per week do you engage in moderate to strenuous exercise (like a brisk walk)?: 0 days    On average, how many minutes do you engage in exercise at this level?: 0 min  Stress: No Stress Concern Present (06/16/2021)   Received from [facility] - Occupational Stress Questionnaire  Social Connections: Moderately Integrated (02/14/2024)   Social Connection and Isolation Panel    Frequency of Communication with Friends and Family: More than three times a week    Frequency of Social Gatherings with Friends and Family: More than three times a week    Attends Religious Services: More than  4 times per year    Active Member of [employer] or Organizations: Yes    Attends Engineer, structural: More than 4 times per year    Marital Status: Divorced  Intimate Partner Violence: Not At Risk (02/14/2024)   Humiliation, Afraid, Rape, and Kick questionnaire    Fear of Current or Ex-Partner: No    Emotionally Abused: No    Physically Abused: No    Sexually Abused: No   FAMILY HISTORY: Family History  Problem Relation Age of Onset   Lung cancer Mother    Heart attack Father 48   Mental illness Father    Bipolar disorder Brother    Bipolar disorder Paternal Uncle    Bipolar disorder Paternal Uncle    ALLERGIES:  is allergic to lithium,  penicillins, sulfa antibiotics, and benzonatate.  MEDICATIONS:  Current Outpatient Medications  Medication Sig Dispense Refill   amLODipine  (NORVASC ) 5 MG tablet Take 5 mg by mouth daily.     ARIPiprazole  (ABILIFY ) 10 MG tablet Take 1 tablet (10 mg total) by mouth daily. 90 tablet 0   escitalopram  (LEXAPRO ) 10 MG tablet TAKE ONE TABLET BY MOUTH ONE TIME DAILY 90 tablet 3   furosemide (LASIX) 20 MG tablet Take 20 mg by mouth daily as needed for fluid or edema.     glimepiride (AMARYL) 2 MG tablet Take 2 mg by mouth every morning.     HYDROcodone -acetaminophen  (NORCO) 10-325 MG tablet Take 1 tablet by mouth every 8 (eight) hours as needed for severe pain (pain score 7-10). Must last 30 days. 90 tablet 0   [START ON 07/01/2024] HYDROcodone -acetaminophen  (NORCO) 10-325 MG tablet Take 1 tablet by mouth every 8 (eight) hours as needed for severe pain (pain score 7-10). Must last 30 days. 90 tablet 0   [START ON 07/31/2024] HYDROcodone -acetaminophen  (NORCO) 10-325 MG tablet Take 1 tablet by mouth every 8 (eight) hours as needed for severe pain (pain score 7-10). Must last 30 days. 90 tablet 0   lamoTRIgine  (LAMICTAL ) 200 MG tablet TAKE ONE TABLET BY MOUTH ONE TIME DAILY 90 tablet 3   lamoTRIgine  (LAMICTAL ) 25 MG tablet Take 1 tablet (25 mg total) by  mouth daily. Take daily with 200 mg , total of 225 mg 90 tablet 0   metFORMIN (GLUCOPHAGE) 500 MG tablet Take 500 mg by mouth 2 (two) times daily with a meal.     OZEMPIC, 1 MG/DOSE, 4 MG/3ML SOPN Inject 1 mg into the skin once a week. Sundays     pantoprazole  (PROTONIX ) 40 MG tablet Take 40 mg by mouth every morning.     polyethylene glycol powder (GLYCOLAX/MIRALAX) 17 GM/SCOOP powder Take 17 g by mouth daily.     spironolactone (ALDACTONE) 25 MG tablet Take 25 mg by mouth daily.     tizanidine (ZANAFLEX) 2 MG capsule Take 2 mg by mouth 3 (three) times daily as needed for muscle spasms. Takes differently     XIFAXAN  550 MG TABS tablet Take 550 mg by mouth 2 (two) times daily.     meloxicam (MOBIC) 7.5 MG tablet Take 7.5 mg by mouth. (Patient not taking: Reported on 06/13/2024)     No current facility-administered medications for this visit.   Facility-Administered Medications Ordered in Other Visits  Medication Dose Route Frequency Provider Last Rate Last Admin   sodium chloride  flush (NS) 0.9 % injection 10 mL  10 mL Intracatheter Once PRN Brahmanday, Govinda R, MD        PHYSICAL EXAMINATION: Vitals:   06/13/24 1116  BP: (!) 123/44  Pulse: 68  Resp: 16  Temp: (!) 97.4 F (36.3 C)  SpO2: 100%   Filed Weights   06/13/24 1116  Weight: 182 lb (82.6 kg)   Physical Exam Vitals reviewed.  Constitutional:      Appearance: She is not ill-appearing.  Eyes:     General: No scleral icterus. Cardiovascular:     Rate and Rhythm: Normal rate and regular rhythm.  Pulmonary:     Effort: No respiratory distress.  Abdominal:     General: There is no distension.     Palpations: Abdomen is soft.     Tenderness: There is no abdominal tenderness. There is no guarding.  Musculoskeletal:     Comments: Walking cast rle  Skin:    General:  Skin is warm and dry.  Neurological:     Mental Status: She is alert and oriented to person, place, and time.  Psychiatric:        Mood and Affect: Mood  normal.        Behavior: Behavior normal.     LABORATORY DATA:  I have reviewed the data as listed Lab Results  Component Value Date   WBC 4.0 06/13/2024   HGB 6.6 (LL) 06/13/2024   HCT 21.6 (L) 06/13/2024   MCV 89.3 06/13/2024   PLT 101 (L) 06/13/2024   Iron /TIBC/Ferritin/ %Sat    Component Value Date/Time   IRON  22 (L) 06/13/2024 1046   IRON  37 12/06/2019 1507   TIBC 475 (H) 06/13/2024 1046   TIBC 428 12/06/2019 1507   FERRITIN 55 06/13/2024 1046   FERRITIN 45 12/06/2019 1507   IRONPCTSAT 5 (L) 06/13/2024 1046   IRONPCTSAT 9 (LL) 12/06/2019 1507   MR ABDOMEN WWO CONTRAST Result Date: 06/08/2024 CLINICAL DATA:  Chronic micronodular liver disease with areas of fibrosis, ascites and varices on prior imaging. Cirrhosis. 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 EXAM: MRI ABDOMEN WITHOUT AND WITH CONTRAST TECHNIQUE: Multiplanar multisequence MR imaging of the abdomen was performed both before and after the administration of intravenous contrast. CONTRAST:  8mL GADAVIST  GADOBUTROL  1 MMOL/ML IV SOLN COMPARISON:  Abdominal ultrasound 11/15/2023 and 08/03/2023 FINDINGS: Lower chest:  The visualized lower chest appears unremarkable. Hepatobiliary: Advanced hepatic cirrhosis again noted with diffuse contour irregularity of the liver, micronodularity and scattered fibrosis. No arterial phase enhancing lesion or restricted diffusion demonstrated. No significant intra or extrahepatic biliary dilatation status post cholecystectomy. Pancreas: Unremarkable. No pancreatic ductal dilatation or surrounding inflammatory changes. Spleen: Stable mild splenomegaly (15.9 x 16.8 x 7.6 cm). No focal splenic lesion. Adrenals/Urinary Tract: Both adrenal glands appear normal. Interval ablation of previously demonstrated mildly complex cystic lesion in the interpolar region of the right kidney. Ablation defect measures approximately 2.3 x 2.7 x 1.9 cm and demonstrates a peripheral rim of  low T2 signal and mild intrinsic T1 shortening. No suspicious enhancement or restricted diffusion. No suspicious renal lesions are identified. There is no hydronephrosis. Stomach/Bowel: The stomach appears unremarkable for its degree of distension. No evidence of bowel wall thickening, distention or surrounding inflammatory change. Vascular/Lymphatic: Similar prominent lymph nodes in the gastrohepatic ligament and porta hepatis, likely reactive. No acute vascular findings are demonstrated. Multiple left upper quadrant and retroperitoneal varices, as before. Other: No evidence of abdominal wall hernia or ascites. Musculoskeletal: No acute or significant osseous findings. IMPRESSION: 1. Interval ablation of previously demonstrated mildly complex cystic lesion in the interpolar region of the right kidney. No evidence of local recurrence or metastatic disease. No suspicious renal lesions. 2. Advanced hepatic cirrhosis with evidence of portal hypertension including splenomegaly and varices. No suspicious hepatic lesions identified. 3. Similar prominent lymph nodes in the gastrohepatic ligament and porta hepatis, likely reactive. Electronically Signed   By: Elsie Perone M.D.   On: 06/08/2024 15:44   MM 3D DIAGNOSTIC MAMMOGRAM UNILATERAL RIGHT BREAST Result Date: 05/26/2024 CLINICAL DATA:  RIGHT breast callback from new baseline EXAM: DIGITAL DIAGNOSTIC UNILATERAL RIGHT MAMMOGRAM WITH TOMOSYNTHESIS AND CAD; ULTRASOUND RIGHT BREAST LIMITED TECHNIQUE: Right digital diagnostic mammography and breast tomosynthesis was performed. The images were evaluated with computer-aided detection. ; Targeted ultrasound examination of the right breast was performed COMPARISON:  Previous exam(s). ACR Breast Density Category b: There are scattered areas of fibroglandular density. FINDINGS: The previously described finding does not persist  with additional views, consistent with superimposed fibroglandular tissue. No suspicious mass,  microcalcification, or other finding is identified. Given breast density at this location, targeted ultrasound was performed. Targeted ultrasound was of the RIGHT inner breast. No suspicious cystic or solid mass is seen at the site of screening mammographic concern. IMPRESSION: No mammographic or sonographic evidence at the site of screening mammographic concern. RECOMMENDATION: Screening mammogram in one year.(Code:SM-B-01Y) I have discussed the findings and recommendations with the patient. If applicable, a reminder letter will be sent to the patient regarding the next appointment. BI-RADS CATEGORY  1: Negative. Electronically Signed   By: Corean Salter M.D.   On: 05/26/2024 11:30   US  LIMITED ULTRASOUND INCLUDING AXILLA RIGHT BREAST Result Date: 05/26/2024 CLINICAL DATA:  RIGHT breast callback from new baseline EXAM: DIGITAL DIAGNOSTIC UNILATERAL RIGHT MAMMOGRAM WITH TOMOSYNTHESIS AND CAD; ULTRASOUND RIGHT BREAST LIMITED TECHNIQUE: Right digital diagnostic mammography and breast tomosynthesis was performed. The images were evaluated with computer-aided detection. ; Targeted ultrasound examination of the right breast was performed COMPARISON:  Previous exam(s). ACR Breast Density Category b: There are scattered areas of fibroglandular density. FINDINGS: The previously described finding does not persist with additional views, consistent with superimposed fibroglandular tissue. No suspicious mass, microcalcification, or other finding is identified. Given breast density at this location, targeted ultrasound was performed. Targeted ultrasound was of the RIGHT inner breast. No suspicious cystic or solid mass is seen at the site of screening mammographic concern. IMPRESSION: No mammographic or sonographic evidence at the site of screening mammographic concern. RECOMMENDATION: Screening mammogram in one year.(Code:SM-B-01Y) I have discussed the findings and recommendations with the patient. If applicable, a  reminder letter will be sent to the patient regarding the next appointment. BI-RADS CATEGORY  1: Negative. Electronically Signed   By: Corean Salter M.D.   On: 05/26/2024 11:30     ASSESSMENT & PLAN:   Symptomatic anemia # [2020] History of iron  deficiency anemia secondary to chronic GI bleed/cirrhosis; GAVE [EGD in G+FEB 2024].  FEB 2024- Iron  sat 9 Ferritin 45- wnl-PCP; Hb 9-10.  s/p IV iron  x4 [JAN 2024]- MM panel-WNL.   # April 2025- Hmg 5.1. Ferritin 8, Iron  sat 8%. S/p hospitalization and transfusions. She has been receiving IV iron  nearly weekly and counts ranging from 7-8. Last received IV iron  on 06/01/24  # Today, hemoglobin 6.6. Worse. Proceed with venofer  today and blood transfusion tomorrow. Plan for transfusions to keep hemoglobin > 7.      # Etiology: Cirrhosis/GI bleed FEB 2024- GAVE -non-bleeding-; colo-? Capsule study.  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 Given recent drop in hemoglobin and hospitalization she underwent colonoscopy, endoscopy, and capsule (not resulted) with Dr. Jinny which showed multiple angiodysplastic lesions, treated with APC, 3 small polyps removed, non-bleeding internal hemorrhoids, esophagitis. Pathology of esophagus was consistent with reflux. Descending colon polyps were tubular adenomas without high grade dysplasia. Sigmoid colon polyp was hyperplastic.   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 Stable.    # Liver cirrhosis, splenomegaly, small volume ascites.  Patient does not drink alcohol. NAFLD. Followed by GI. 10/2023 US  - cirrhosis, retroperitoneal LNs. January 2025 MRI Abdomen w/wo contrast- chronic liver disease with micronodular liver with fibrosis, ascites, left sided varices. No aggressive appearing masses. Prominent upper abdominal retroperitoneal nodes similar to 2024 MRI. AFP 12/29/22 was 2.5/stable. Defer to GI for HCC screening. MR Abdomen 06/08/24 showed advanced hepatic cirrhosis with  diffuse irregularity and contour of liver. No suspicious lesions identified.   #  Right kidney complex cyst- incidental May 2024 MRI- s/p urology, Dr Twylla- bilateral bosniak 1 and 2 renal cystic lesions. More complex lesion along mid right kidney is stable. She had MR Abdomen with GI which noted interval ablation of previously demonstrated mildly complex cystic lesion in right kidney. No suspicious enhancement noted. No other suspicious renal lesions identified. No hydronephrosis.     # Weight loss sec to oezmpic [40 pounds]- stable. Low concerns for malignancy at this time.    DISPOSITION: Venofer  today. 1 unit blood next available.  1 week (prefers Friday)- lab (H&H, hold tube) & venofer . D2 poss transfusion 2 weeks (prefers Friday)- lab (H&H, hold tube), venofer  D2 poss transfusion 3 weeks - (prefers Friday)- lab (H&H, hold tube), venofer  D2 poss transfusion 4 weeks- lab (cbc, ferritin, iron  studies, hold tube), see me, + venofer , D2 poss transfusion- la  No problem-specific Assessment & Plan notes found for this encounter.  All questions were answered. The patient knows to call the clinic with any problems, questions or concerns.  Tinnie KANDICE Dawn, NP 06/13/2024

## 2024-06-13 NOTE — Addendum Note (Signed)
 Addended by: CINDIE JESUSA HERO on: 06/13/2024 03:41 PM   Modules accepted: Orders

## 2024-06-14 ENCOUNTER — Ambulatory Visit: Admitting: Psychiatry

## 2024-06-14 ENCOUNTER — Inpatient Hospital Stay

## 2024-06-14 ENCOUNTER — Ambulatory Visit: Admitting: Student in an Organized Health Care Education/Training Program

## 2024-06-14 DIAGNOSIS — D500 Iron deficiency anemia secondary to blood loss (chronic): Secondary | ICD-10-CM | POA: Diagnosis not present

## 2024-06-14 DIAGNOSIS — D649 Anemia, unspecified: Secondary | ICD-10-CM

## 2024-06-14 LAB — PREPARE RBC (CROSSMATCH)

## 2024-06-14 MED ORDER — SODIUM CHLORIDE 0.9% IV SOLUTION
250.0000 mL | INTRAVENOUS | Status: DC
  Administered 2024-06-14 (×2): 100 mL via INTRAVENOUS
  Filled 2024-06-14: qty 250

## 2024-06-14 NOTE — Patient Instructions (Signed)
 Blood Transfusion, Adult A blood transfusion is a procedure in which you receive blood through an IV tube. You may need this procedure because of: A bleeding disorder. An illness. An injury. A surgery. The blood may come from someone else (a donor). You may also be able to donate blood for yourself before a surgery. The blood given in a transfusion may be made up of different types of cells. You may get: Red blood cells. These carry oxygen to the cells in the body. Platelets. These help your blood to clot. Plasma. This is the liquid part of your blood. It carries proteins and other substances through the body. White blood cells. These help you fight infections. If you have a clotting disorder, you may also get other types of blood products. Depending on the type of blood product, this procedure may take 1-4 hours to complete. Tell your doctor about: Any bleeding problems you have. Any reactions you have had during a blood transfusion in the past. Any allergies you have. All medicines you are taking, including vitamins, herbs, eye drops, creams, and over-the-counter medicines. Any surgeries you have had. Any medical conditions you have. Whether you are pregnant or may be pregnant. What are the risks? Talk with your health care provider about risks. The most common problems include: A mild allergic reaction. This includes red, swollen areas of skin (hives) and itching. Fever or chills. This may be the body's response to new blood cells received. This may happen during or up to 4 hours after the transfusion. More serious problems may include: A serious allergic reaction. This includes breathing trouble or swelling around the face and lips. Too much fluid in the lungs. This may cause breathing problems. Lung injury. This causes breathing trouble and low oxygen in the blood. This can happen within hours of the transfusion or days later. Too much iron . This can happen after getting many blood  transfusions over a period of time. An infection or virus passed through the blood. This is rare. Donated blood is carefully tested before it is given. Your body's defense system (immune system) trying to attack the new blood cells. This is rare. Symptoms may include fever, chills, nausea, low blood pressure, and low back or chest pain. Donated cells attacking healthy tissues. This is rare. What happens before the procedure? You will have a blood test to find out your blood type. The test also finds out what type of blood your body will accept and matches it to the donor type. If you are going to have a planned surgery, you may be able to donate your own blood. This may be done in case you need a transfusion. You will have your temperature, blood pressure, and pulse checked. You may receive medicine to help prevent an allergic reaction. This may be done if you have had a reaction to a transfusion before. This medicine may be given to you by mouth or through an IV tube. What happens during the procedure?  An IV tube will be put into one of your veins. The bag of blood will be attached to your IV tube. Then, the blood will enter through your vein. Your temperature, blood pressure, and pulse will be checked often. This is done to find early signs of a transfusion reaction. Tell your nurse right away if you have any of these symptoms: Shortness of breath or trouble breathing. Chest or back pain. Fever or chills. Red, swollen areas of skin or itching. If you have any signs  or symptoms of a reaction, your transfusion will be stopped. You may also be given medicine. When the transfusion is finished, your IV tube will be taken out. Pressure may be put on the IV site for a few minutes. A bandage (dressing) will be put on the IV site. The procedure may vary among doctors and hospitals. What happens after the procedure? You will be monitored until you leave the hospital or clinic. This includes  checking your temperature, blood pressure, pulse, breathing rate, and blood oxygen level. Your blood may be tested to see how you have responded to the transfusion. You may be warmed with fluids or blankets. This is done to keep the temperature of your body normal. If you have your procedure in an outpatient setting, you will be told whom to contact to report any reactions. Where to find more information Visit the [employer]: redcross.org Summary A blood transfusion is a procedure in which you receive blood through an IV tube. The blood you are given may be made up of different blood cells. You may receive red blood cells, platelets, plasma, or white blood cells. Your temperature, blood pressure, and pulse will be checked often. After the procedure, your blood may be tested to see how you have responded. This information is not intended to replace advice given to you by your health care provider. Make sure you discuss any questions you have with your health care provider. Document Revised: 01/16/2022 Document Reviewed: 01/16/2022 Elsevier Patient Education  2024 [employer].

## 2024-06-15 ENCOUNTER — Inpatient Hospital Stay

## 2024-06-15 LAB — TYPE AND SCREEN
ABO/RH(D): B POS
Antibody Screen: POSITIVE
Donor AG Type: NEGATIVE
Unit division: 0

## 2024-06-15 LAB — BPAM RBC
Blood Product Expiration Date: 202509142359
ISSUE DATE / TIME: 202508130814
Unit Type and Rh: 5100

## 2024-06-16 ENCOUNTER — Inpatient Hospital Stay

## 2024-06-16 ENCOUNTER — Other Ambulatory Visit: Payer: Self-pay | Admitting: *Deleted

## 2024-06-16 VITALS — BP 126/45 | HR 63 | Temp 98.3°F | Resp 16

## 2024-06-16 DIAGNOSIS — D509 Iron deficiency anemia, unspecified: Secondary | ICD-10-CM

## 2024-06-16 DIAGNOSIS — D500 Iron deficiency anemia secondary to blood loss (chronic): Secondary | ICD-10-CM

## 2024-06-16 DIAGNOSIS — D649 Anemia, unspecified: Secondary | ICD-10-CM

## 2024-06-16 LAB — HEMOGLOBIN AND HEMATOCRIT (CANCER CENTER ONLY)
HCT: 25.1 % — ABNORMAL LOW (ref 36.0–46.0)
Hemoglobin: 7.6 g/dL — ABNORMAL LOW (ref 12.0–15.0)

## 2024-06-16 LAB — SAMPLE TO BLOOD BANK

## 2024-06-16 LAB — PREPARE RBC (CROSSMATCH)

## 2024-06-16 MED ORDER — IRON SUCROSE 20 MG/ML IV SOLN
200.0000 mg | Freq: Once | INTRAVENOUS | Status: AC
  Administered 2024-06-16: 200 mg via INTRAVENOUS
  Filled 2024-06-16: qty 10

## 2024-06-16 NOTE — Patient Instructions (Signed)

## 2024-06-19 ENCOUNTER — Encounter: Payer: Self-pay | Admitting: Psychiatry

## 2024-06-19 ENCOUNTER — Other Ambulatory Visit: Payer: Self-pay | Admitting: Nurse Practitioner

## 2024-06-19 ENCOUNTER — Inpatient Hospital Stay

## 2024-06-19 ENCOUNTER — Telehealth (INDEPENDENT_AMBULATORY_CARE_PROVIDER_SITE_OTHER): Admitting: Psychiatry

## 2024-06-19 VITALS — BP 115/42 | HR 62 | Temp 98.4°F | Resp 16

## 2024-06-19 DIAGNOSIS — D500 Iron deficiency anemia secondary to blood loss (chronic): Secondary | ICD-10-CM | POA: Diagnosis not present

## 2024-06-19 DIAGNOSIS — D649 Anemia, unspecified: Secondary | ICD-10-CM

## 2024-06-19 DIAGNOSIS — F418 Other specified anxiety disorders: Secondary | ICD-10-CM

## 2024-06-19 DIAGNOSIS — F3178 Bipolar disorder, in full remission, most recent episode mixed: Secondary | ICD-10-CM | POA: Diagnosis not present

## 2024-06-19 DIAGNOSIS — D509 Iron deficiency anemia, unspecified: Secondary | ICD-10-CM

## 2024-06-19 MED ORDER — SODIUM CHLORIDE 0.9% IV SOLUTION
250.0000 mL | INTRAVENOUS | Status: DC
  Administered 2024-06-19: 100 mL via INTRAVENOUS
  Filled 2024-06-19: qty 250

## 2024-06-19 MED ORDER — DIPHENHYDRAMINE HCL 25 MG PO CAPS
25.0000 mg | ORAL_CAPSULE | Freq: Once | ORAL | Status: AC
  Administered 2024-06-19: 25 mg via ORAL
  Filled 2024-06-19: qty 1

## 2024-06-19 MED ORDER — ONDANSETRON HCL 8 MG PO TABS
8.0000 mg | ORAL_TABLET | Freq: Three times a day (TID) | ORAL | 0 refills | Status: DC | PRN
Start: 2024-06-19 — End: 2024-07-31

## 2024-06-19 MED ORDER — ACETAMINOPHEN 325 MG PO TABS
650.0000 mg | ORAL_TABLET | Freq: Once | ORAL | Status: AC
  Administered 2024-06-19: 650 mg via ORAL
  Filled 2024-06-19: qty 2

## 2024-06-19 MED ORDER — ONDANSETRON HCL 4 MG/2ML IJ SOLN
4.0000 mg | Freq: Once | INTRAMUSCULAR | Status: AC
  Administered 2024-06-19: 4 mg via INTRAVENOUS

## 2024-06-19 NOTE — Progress Notes (Signed)
 Virtual Visit via Video Note  I connected with Margaret [name] on 06/19/24 at  9:00 AM EDT by a video enabled telemedicine application and verified that I am speaking with the correct person using two identifiers.  Location Provider Location : [facility] Patient Location : Home  Participants: Patient , Provider   I discussed the limitations of evaluation and management by telemedicine and the availability of in person appointments. The patient expressed understanding and agreed to proceed.   I discussed the assessment and treatment plan with the patient. The patient was provided an opportunity to ask questions and all were answered. The patient agreed with the plan and demonstrated an understanding of the instructions.   The patient was advised to call back or seek an in-person evaluation if the symptoms worsen or if the condition fails to improve as anticipated.  BH MD OP Progress Note  06/19/2024 9:19 AM Margaret [name]  MRN:  [id number]  Chief Complaint:  Chief Complaint  Patient presents with   Follow-up   Anxiety   Depression   Medication Refill   Discussed the use of AI scribe software for clinical note transcription with the patient, who gave verbal consent to proceed.  History of Present Illness Margaret [name] is a 68 year old Caucasian female who lives in [place], has a history of bipolar disorder, other specified anxiety disorder, hyperlipidemia, liver cirrhosis, right kidney complex cyst, thrombocytopenia, chronic pain was evaluated by telemedicine today.  She reports doing mentally okay and describes stability in her mood. She denies recent episodes of mania or depression and states that her current medication regimen, including Lamictal  225 mg and Abilify  10 mg, is effective. She reports that she is no longer experiencing weepiness since her Lamictal  dose was increased to 225 mg. She denies experiencing side effects from either Abilify  or Lamictal . She also denies  hearing voices, seeing things, or experiencing paranoia. She reports no concerns or questions about her medications at this time.  She reports disrupted sleep, which she attributes to being up and down at night due to incontinence rather than mood symptoms.  She receives in-home assistance with household tasks 3 hours twice a week. She has not been driving since her toe fracture; her son and her son's mother-in-law have been providing transportation to appointments 2 or 3 times a week. She has remained at home since her injury.  She has iron  deficiency requiring transfusions and infusions. She experiences incontinence. She fractured the phalanx of the right great toe in July. She has gastrointestinal bleeding with a planned endoscopy.   Visit Diagnosis:    ICD-10-CM   1. Bipolar disorder, in full remission, most recent episode mixed (HCC)  F31.78    Type I    2. Other specified anxiety disorders  F41.8    With limited symptom attacks      Past Psychiatric History: I have reviewed past psychiatric history from progress note on 08/21/2019.  Past trials of Depakote, lithium, risperidone, Xanax .  Past Medical History:  Past Medical History:  Diagnosis Date   (HFpEF) heart failure with preserved ejection fraction (HCC)    a.) TTE 12/15/2022: EF >55%, mild LVH, G1DD, mild LAE, mild MAC, triv AR/MR/TR, mild TR, RVSP 30.7, mod AS (MPG 20)   Anxiety    Aortic atherosclerosis (HCC)    Aortic stenosis    a.) TTE 12/15/2022: moderate AS (MPG 20 mmHg; AVA = 1.7 cm2)   Bilateral carotid artery disease (HCC)    a.) doppler 12/22/2020: <50% BICA  Bilateral renal cysts    Bipolar 1 disorder (HCC)    CAD (coronary artery disease) 11/04/2022   a.) CT chest 11/04/2022: 3v CAD   Cardiac murmur    CKD (chronic kidney disease), stage III (HCC)    DDD (degenerative disc disease), lumbar    Depression    DM (diabetes mellitus), type 2 (HCC)    Former smoker    Frequent falls    GERD  (gastroesophageal reflux disease)    H/O: GI bleed    Heart murmur    History of blood transfusion    History of MRSA infection 2017   HLD (hyperlipidemia)    Hypertension    IDA (iron  deficiency anemia)    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)    Multiple pulmonary nodules    Nephrolithiasis    Occasional tremors    Osteoarthritis    Osteoporosis    Portal hypertensive gastropathy (HCC)    Portal venous hypertension (HCC)    Renal mass, right 03/30/2023   a.) MRI ABD 03/30/2023: 1.2 x 1.2 cm complex cystic renal cortical mass in the posterior mid to low RIGHT kidney (Bosniak 3); b.) MRI ABD 08/03/2023: interval increase in size to 1.4 x 1.3 cm -> concerning for RCC; c.) US  ABD 10/12/2023: 1.7 complex cystic mass RIGHT kidney; d.) MRI ABD 11/15/2023: interval increase in size to 1.6 x 1.2 x 1.0 cm   Splenomegaly    Thoracic spondylosis    Thrombocytopenia (HCC)    Tubular adenoma of colon    Uses walker    Venous insufficiency    Ventral hernia     Past Surgical History:  Procedure Laterality Date   ABDOMINAL HYSTERECTOMY     CHOLECYSTECTOMY     COLONOSCOPY N/A 02/15/2024   Procedure: COLONOSCOPY;  Surgeon: Jinny Carmine, MD;  Location: [facility] ENDOSCOPY;  Service: Endoscopy;  Laterality: N/A;   COLONOSCOPY  02/15/2024   Procedure: COLONOSCOPY, WITH ARGON PLASMA COAGULATION;  Surgeon: Jinny Carmine, MD;  Location: [facility] ENDOSCOPY;  Service: Endoscopy;;   COLONOSCOPY WITH PROPOFOL  N/A 06/23/2019   Procedure: COLONOSCOPY WITH PROPOFOL ;  Surgeon: Therisa Bi, MD;  Location: [facility] ENDOSCOPY;  Service: Gastroenterology;  Laterality: N/A;   COLONOSCOPY WITH PROPOFOL  N/A 12/08/2022   Procedure: COLONOSCOPY WITH PROPOFOL ;  Surgeon: Maryruth Ole DASEN, MD;  Location: [facility] ENDOSCOPY;  Service: Endoscopy;  Laterality: N/A;   COLONOSCOPY WITH PROPOFOL  N/A 05/18/2023   Procedure: COLONOSCOPY WITH PROPOFOL ;  Surgeon: Maryruth Ole DASEN, MD;  Location: [facility] ENDOSCOPY;  Service: Endoscopy;   Laterality: N/A;   ESOPHAGOGASTRODUODENOSCOPY N/A 02/15/2024   Procedure: EGD (ESOPHAGOGASTRODUODENOSCOPY);  Surgeon: Jinny Carmine, MD;  Location: [facility] ENDOSCOPY;  Service: Endoscopy;  Laterality: N/A;   ESOPHAGOGASTRODUODENOSCOPY (EGD) WITH PROPOFOL  N/A 06/23/2019   Procedure: ESOPHAGOGASTRODUODENOSCOPY (EGD) WITH PROPOFOL ;  Surgeon: Therisa Bi, MD;  Location: [facility] ENDOSCOPY;  Service: Gastroenterology;  Laterality: N/A;   ESOPHAGOGASTRODUODENOSCOPY (EGD) WITH PROPOFOL  N/A 12/08/2022   Procedure: ESOPHAGOGASTRODUODENOSCOPY (EGD) WITH PROPOFOL ;  Surgeon: Maryruth Ole DASEN, MD;  Location: [facility] ENDOSCOPY;  Service: Endoscopy;  Laterality: N/A;  DM, OZEMPIC   ESOPHAGOGASTRODUODENOSCOPY (EGD) WITH PROPOFOL  N/A 01/12/2023   Procedure: ESOPHAGOGASTRODUODENOSCOPY (EGD) WITH PROPOFOL ;  Surgeon: Maryruth Ole DASEN, MD;  Location: [facility] ENDOSCOPY;  Service: Endoscopy;  Laterality: N/A;  DM   GIVENS CAPSULE STUDY  02/15/2024   Procedure: IMAGING PROCEDURE, GI TRACT, INTRALUMINAL, VIA CAPSULE;  Surgeon: Jinny Carmine, MD;  Location: [facility] ENDOSCOPY;  Service: Endoscopy;;   HOT HEMOSTASIS  02/15/2024   Procedure: EGD, WITH ARGON PLASMA COAGULATION;  Surgeon:  Jinny Carmine, MD;  Location: [facility] ENDOSCOPY;  Service: Endoscopy;;   IR RADIOLOGIST EVAL & MGMT  10/28/2023   IR RADIOLOGIST EVAL & MGMT  01/18/2024   POLYPECTOMY  05/18/2023   Procedure: POLYPECTOMY;  Surgeon: Maryruth Ole DASEN, MD;  Location: [facility] ENDOSCOPY;  Service: Endoscopy;;   POLYPECTOMY  02/15/2024   Procedure: POLYPECTOMY, INTESTINE;  Surgeon: Jinny Carmine, MD;  Location: [facility] ENDOSCOPY;  Service: Endoscopy;;   TONSILLECTOMY     TOTAL HIP ARTHROPLASTY Right 2017    Family Psychiatric History: I have reviewed family psychiatric history from progress note on 08/21/2019.  Family History:  Family History  Problem Relation Age of Onset   Lung cancer Mother    Heart attack Father 30   Mental illness Father    Bipolar disorder Brother     Bipolar disorder Paternal Uncle    Bipolar disorder Paternal Uncle     Social History: I have reviewed social history from progress note on 08/21/2019. Social History   Socioeconomic History   Marital status: Divorced    Spouse name: Not on file   Number of children: 2   Years of education: Not on file   Highest education level: Not on file  Occupational History   Not on file  Tobacco Use   Smoking status: Former    Current packs/day: 0.00    Types: Cigarettes    Quit date: 03/03/2011    Years since quitting: 13.3   Smokeless tobacco: Never  Vaping Use   Vaping status: Never Used  Substance and Sexual Activity   Alcohol use: Yes    Comment: rarely   Drug use: Not Currently   Sexual activity: Not Currently  Other Topics Concern   Not on file  Social History Narrative   Not on file   Social Drivers of Health   Financial Resource Strain: Low Risk  (05/04/2024)   Received from [facility]   Overall Financial Resource Strain (CARDIA)    Difficulty of Paying Living Expenses: Not hard at all  Food Insecurity: No Food Insecurity (05/04/2024)   Received from [facility]   Hunger Vital Sign    Within the past 12 months, you worried that your food would run out before you got the money to buy more.: Never true    Within the past 12 months, the food you bought just didn't last and you didn't have money to get more.: Never true  Transportation Needs: No Transportation Needs (05/04/2024)   Received from [facility] - Transportation    In the past 12 months, has lack of transportation kept you from medical appointments or from getting medications?: No    Lack of Transportation (Non-Medical): No  Physical Activity: Inactive (06/16/2021)   Received from [facility]   Exercise Vital Sign    On average, how many days per week do you engage in moderate to strenuous exercise (like a brisk walk)?: 0 days    On  average, how many minutes do you engage in exercise at this level?: 0 min  Stress: No Stress Concern Present (06/16/2021)   Received from [facility] - Occupational Stress Questionnaire  Social Connections: Moderately Integrated (02/14/2024)   Social Connection and Isolation Panel    Frequency of Communication with Friends and Family: More than three times a week    Frequency of Social Gatherings with Friends and Family: More than three  times a week    Attends Religious Services: More than 4 times per year    Active Member of Clubs or Organizations: Yes    Attends Banker Meetings: More than 4 times per year    Marital Status: Divorced    Allergies:  Allergies  Allergen Reactions   Lithium Nausea And Vomiting and Other (See Comments)   Penicillins Hives    Resulted in hospitalization   Sulfa Antibiotics Hives    Resulted in hospitalization   Benzonatate Other (See Comments)    Metabolic Disorder Labs: Lab Results  Component Value Date   HGBA1C 7.3 (H) 06/22/2019   MPG 162.81 06/22/2019   No results found for: PROLACTIN No results found for: CHOL, TRIG, HDL, CHOLHDL, VLDL, LDLCALC Lab Results  Component Value Date   TSH 1.463 12/29/2022   TSH 1.737 11/16/2019    Therapeutic Level Labs: No results found for: LITHIUM No results found for: VALPROATE No results found for: CBMZ  Current Medications: Current Outpatient Medications  Medication Sig Dispense Refill   amLODipine  (NORVASC ) 5 MG tablet Take 5 mg by mouth daily.     ARIPiprazole  (ABILIFY ) 10 MG tablet Take 1 tablet (10 mg total) by mouth daily. 90 tablet 0   escitalopram  (LEXAPRO ) 10 MG tablet TAKE ONE TABLET BY MOUTH ONE TIME DAILY 90 tablet 3   furosemide (LASIX) 20 MG tablet Take 20 mg by mouth daily as needed for fluid or edema.     glimepiride (AMARYL) 2 MG tablet Take 2 mg by mouth every morning.      HYDROcodone -acetaminophen  (NORCO) 10-325 MG tablet Take 1 tablet by mouth every 8 (eight) hours as needed for severe pain (pain score 7-10). Must last 30 days. 90 tablet 0   [START ON 07/01/2024] HYDROcodone -acetaminophen  (NORCO) 10-325 MG tablet Take 1 tablet by mouth every 8 (eight) hours as needed for severe pain (pain score 7-10). Must last 30 days. 90 tablet 0   [START ON 07/31/2024] HYDROcodone -acetaminophen  (NORCO) 10-325 MG tablet Take 1 tablet by mouth every 8 (eight) hours as needed for severe pain (pain score 7-10). Must last 30 days. 90 tablet 0   lamoTRIgine  (LAMICTAL ) 200 MG tablet TAKE ONE TABLET BY MOUTH ONE TIME DAILY 90 tablet 3   lamoTRIgine  (LAMICTAL ) 25 MG tablet Take 1 tablet (25 mg total) by mouth daily. Take daily with 200 mg , total of 225 mg 90 tablet 0   meloxicam (MOBIC) 7.5 MG tablet Take 7.5 mg by mouth. (Patient not taking: Reported on 06/13/2024)     metFORMIN (GLUCOPHAGE) 500 MG tablet Take 500 mg by mouth 2 (two) times daily with a meal.     ondansetron  (ZOFRAN ) 8 MG tablet Take 1 tablet (8 mg total) by mouth every 8 (eight) hours as needed for nausea or vomiting. 30 tablet 0   OZEMPIC, 1 MG/DOSE, 4 MG/3ML SOPN Inject 1 mg into the skin once a week. Sundays     pantoprazole  (PROTONIX ) 40 MG tablet Take 40 mg by mouth every morning.     polyethylene glycol powder (GLYCOLAX/MIRALAX) 17 GM/SCOOP powder Take 17 g by mouth daily.     spironolactone (ALDACTONE) 25 MG tablet Take 25 mg by mouth daily.     tizanidine (ZANAFLEX) 2 MG capsule Take 2 mg by mouth 3 (three) times daily as needed for muscle spasms. Takes differently     XIFAXAN  550 MG TABS tablet Take 550 mg by mouth 2 (two) times daily.     No current facility-administered  medications for this visit.   Facility-Administered Medications Ordered in Other Visits  Medication Dose Route Frequency Provider Last Rate Last Admin   sodium chloride  flush (NS) 0.9 % injection 10 mL  10 mL Intracatheter Once PRN Brahmanday,  Govinda R, MD         Musculoskeletal: Strength & Muscle Tone: UTA Gait & Station: Seated Patient leans: N/A  Psychiatric Specialty Exam: Review of Systems  Psychiatric/Behavioral:  Positive for sleep disturbance. The patient is nervous/anxious.     There were no vitals taken for this visit.There is no height or weight on file to calculate BMI.  General Appearance: Casual  Eye Contact:  Fair  Speech:  Clear and Coherent  Volume:  Normal  Mood:  Anxious  Affect:  Appropriate  Thought Process:  Goal Directed and Descriptions of Associations: Intact  Orientation:  Full (Time, Place, and Person)  Thought Content: Logical   Suicidal Thoughts:  No  Homicidal Thoughts:  No  Memory:  Immediate;   Fair Recent;   Fair Remote;   Fair  Judgement:  Fair  Insight:  Fair  Psychomotor Activity:  Normal  Concentration:  Concentration: Fair and Attention Span: Fair  Recall:  [employer] of Knowledge: Fair  Language: Fair  Akathisia:  No  Handed:  Right  AIMS (if indicated): not done  Assets:  Communication Skills Desire for Improvement Housing Social Support Transportation  ADL's:  Intact  Cognition: WNL  Sleep:  interrupted due to urinary problems   Screenings: AIMS    Flowsheet Row Office Visit from 07/23/2023 in [facility] Video Visit from 03/16/2023 in [facility] Office Visit from 10/13/2022 in [facility] Video Visit from 05/14/2022 in [facility] Video Visit from 02/13/2022 in [facility]  AIMS Total Score 0 0 0 0 0   GAD-7    Flowsheet Row Office Visit from 07/23/2023 in [facility] Office Visit from 10/13/2022 in [facility] Video Visit from 02/13/2022 in [facility]  Psychiatric Associates  Total GAD-7 Score 2 2 2    PHQ2-9    Flowsheet Row Infusion from 06/19/2024 in [facility] Dept Of [place]. [facility] Infusion from 06/14/2024 in [facility] Dept Of [place]. [facility] Office Visit from 06/13/2024 in [facility] Dept Of [place]. [facility] Office Visit from 05/23/2024 in [facility] Infusion from 05/19/2024 in [facility] Dept Of [place]. [facility]  PHQ-2 Total Score 0 0 0 0 0   Flowsheet Row Infusion from 06/19/2024 in [facility] Of [place]. [facility] Most recent reading at 06/19/2024  1:25 PM Video Visit from 06/19/2024 in [facility] Most recent reading at 06/19/2024  9:22 AM Office Visit from 06/13/2024 in [facility] Dept Of [place]. [facility] Most recent reading at 06/13/2024 11:21 AM  C-SSRS RISK CATEGORY No Risk No Risk No Risk     Assessment and Plan: Margaret [name] is a 68 year old Caucasian female who has a history of bipolar disorder, chronic pain, disability, divorce, currently  stable on current medication regimen.  Discussed assessment and plan as noted below.  Bipolar disorder in remission most recent episode depressed Currently denies any significant mania or hypomanic symptoms. Continue Lamictal  225 mg daily Continue Abilify  10 mg daily Continue Benztropine  0.5 mg as needed for tremors.  Other specified anxiety disorder with limited symptom attacks-stable Currently reports anxiety is stable. Continue Lexapro  10 mg daily-reduced dosage Continue Hydroxyzine  25-50 mg at bedtime as needed  Follow-up Follow-up in clinic in 3 months or sooner in person.    Consent: Patient/Guardian gives verbal consent for treatment and assignment of benefits  for services provided during this visit. Patient/Guardian expressed understanding and agreed to proceed.   This note was generated in part or whole with voice recognition software. Voice recognition is usually quite accurate but there are transcription errors that can and very often do occur. I apologize for any typographical errors that were not detected and corrected.    Seniah Lawrence, MD 06/21/2024, 8:21 AM

## 2024-06-20 LAB — BPAM RBC
Blood Product Expiration Date: 202509142359
ISSUE DATE / TIME: 202508181357
Unit Type and Rh: 202509142359
Unit Type and Rh: 5100

## 2024-06-20 LAB — TYPE AND SCREEN
ABO/RH(D): B POS
Antibody Screen: POSITIVE
Donor AG Type: NEGATIVE
Unit division: 0

## 2024-06-21 ENCOUNTER — Telehealth: Payer: Self-pay | Admitting: Nurse Practitioner

## 2024-06-21 NOTE — Telephone Encounter (Signed)
 Pt called and left vm asking to r/s endoscopy. Called pt back and left vm letting her know that she needs to contact her PCP office [name] at [facility]) to r/s endoscopy - LH

## 2024-06-23 ENCOUNTER — Inpatient Hospital Stay

## 2024-06-23 ENCOUNTER — Other Ambulatory Visit

## 2024-06-23 ENCOUNTER — Other Ambulatory Visit: Payer: Self-pay | Admitting: *Deleted

## 2024-06-23 VITALS — BP 123/49 | HR 60 | Temp 95.4°F

## 2024-06-23 DIAGNOSIS — D500 Iron deficiency anemia secondary to blood loss (chronic): Secondary | ICD-10-CM

## 2024-06-23 DIAGNOSIS — D649 Anemia, unspecified: Secondary | ICD-10-CM

## 2024-06-23 LAB — HEMOGLOBIN AND HEMATOCRIT (CANCER CENTER ONLY)
HCT: 26.1 % — ABNORMAL LOW (ref 36.0–46.0)
Hemoglobin: 7.9 g/dL — ABNORMAL LOW (ref 12.0–15.0)

## 2024-06-23 MED ORDER — IRON SUCROSE 20 MG/ML IV SOLN
200.0000 mg | Freq: Once | INTRAVENOUS | Status: AC
  Administered 2024-06-23: 200 mg via INTRAVENOUS
  Filled 2024-06-23: qty 10

## 2024-06-23 NOTE — Patient Instructions (Signed)

## 2024-06-24 LAB — PREPARE RBC (CROSSMATCH)

## 2024-06-26 ENCOUNTER — Ambulatory Visit: Admitting: Nurse Practitioner

## 2024-06-26 ENCOUNTER — Other Ambulatory Visit

## 2024-06-26 ENCOUNTER — Inpatient Hospital Stay

## 2024-06-26 ENCOUNTER — Ambulatory Visit

## 2024-06-26 DIAGNOSIS — D649 Anemia, unspecified: Secondary | ICD-10-CM

## 2024-06-26 DIAGNOSIS — D500 Iron deficiency anemia secondary to blood loss (chronic): Secondary | ICD-10-CM

## 2024-06-26 MED ORDER — SODIUM CHLORIDE 0.9% IV SOLUTION
250.0000 mL | INTRAVENOUS | Status: DC
  Administered 2024-06-26: 100 mL via INTRAVENOUS
  Filled 2024-06-26: qty 250

## 2024-06-26 MED ORDER — ACETAMINOPHEN 325 MG PO TABS
650.0000 mg | ORAL_TABLET | Freq: Once | ORAL | Status: AC
  Administered 2024-06-26: 650 mg via ORAL
  Filled 2024-06-26: qty 2

## 2024-06-26 MED ORDER — DIPHENHYDRAMINE HCL 25 MG PO CAPS
25.0000 mg | ORAL_CAPSULE | Freq: Once | ORAL | Status: AC
  Administered 2024-06-26: 25 mg via ORAL
  Filled 2024-06-26: qty 1

## 2024-06-26 NOTE — Patient Instructions (Signed)
 Blood Transfusion, Adult A blood transfusion is a procedure in which you receive blood or a type of blood cell (blood component) through an IV. You may need a blood transfusion when you have a low blood count, which is a low number of any blood cell. This may result from a bleeding disorder, illness, injury, or surgery. The blood may come from a donor, or you may be able to have your own blood collected and stored (autologous blood donation) before a planned surgery. The blood given in a transfusion may be made up of different blood components. You may receive: Red blood cells. These carry oxygen to the cells in the body. Platelets. These help your blood to clot. Plasma. This is the liquid part of your blood. It carries proteins and other substances throughout the body. White blood cells. These help you fight infections. If you have hemophilia or another clotting disorder, you may also receive other types of blood products. Depending on the type of blood product, this procedure may take 1-4 hours to complete. Tell a health care provider about: Any bleeding problems you have. Any previous reactions you have had during a blood transfusion. Any allergies you have. All medicines you are taking, including vitamins, herbs, eye drops, creams, and over-the-counter medicines. Any surgeries you have had. Any medical conditions you have. Whether you are pregnant or may be pregnant. What are the risks? Talk with your health care provider about risks. The most common problems include: A mild allergic reaction, such as red, swollen areas of skin (hives) and itching. Fever or chills. This may be the body's response to new blood cells received. This may occur during or up to 4 hours after the transfusion. More serious problems may include: A serious allergic reaction that causes difficulty breathing or swelling around the face and lips. Transfusion-associated circulatory overload (TACO), or too much fluid in  the lungs. This may cause breathing problems. Transfusion-related acute lung injury (TRALI), which causes breathing difficulty and low oxygen in the blood. This can occur within hours of the transfusion or several days later. Iron overload. This can happen after receiving many blood transfusions over a period of time. Infection or virus being transmitted. This is rare because donated blood is carefully tested before it is given. Hemolytic transfusion reaction. This is rare. It happens when the body's defense system (immune system)tries to attack the new blood cells. Symptoms may include fever, chills, nausea, low blood pressure, and low back or chest pain. Transfusion-associated graft-versus-host disease (TAGVHD). This is rare. It happens when donated cells attack the body's healthy tissues. What happens before the procedure? You will have a blood test to check your blood type. This test is done to know what kind of blood your body will accept and to match it to the donor blood. If you are going to have a planned surgery, you may be able to do an autologous blood donation. This may be done in case you need to have a transfusion. You will have your temperature, blood pressure, and pulse checked before the transfusion. If you have had an allergic reaction to a transfusion in the past, you may be given medicine to help prevent a reaction. This medicine may be given to you by mouth (orally) or through an IV. What happens during the procedure?  An IV will be inserted into one of your veins. The bag of blood will be attached to your IV. The blood will then enter through your vein. Your temperature, blood pressure,  and pulse will be monitored during the transfusion. This monitoring is done to detect early signs of a transfusion reaction. Tell your nurse right away if you have any of these symptoms during the transfusion: Shortness of breath or trouble breathing. Chest or back pain. Fever or  chills. Itching or hives. If you have any signs or symptoms of a reaction, your transfusion will be stopped and you may be given medicine. When the transfusion is complete, your IV will be removed. Pressure may be applied to the IV site for a few minutes. A bandage (dressing)will be applied. The procedure may vary among health care providers and hospitals. What happens after the procedure? Your temperature, blood pressure, pulse, breathing rate, and blood oxygen level will be monitored until you leave the hospital or clinic. Your blood may be tested to see how you have responded to the transfusion. You may be warmed with fluids or blankets to maintain a normal body temperature. If you receive your blood transfusion in an outpatient setting, you will be told whom to contact to report any reactions. Where to find more information Visit the [employer]: redcross.org Summary A blood transfusion is a procedure in which you receive blood or a type of blood cell (blood component) through an IV. The blood given in a transfusion may be made up of different blood components. You may receive red blood cells, platelets, plasma, or white blood cells depending on the condition treated. Your temperature, blood pressure, and pulse will be monitored before, during, and after the transfusion. After the transfusion, your blood may be tested to see how your body has responded. This information is not intended to replace advice given to you by your health care provider. Make sure you discuss any questions you have with your health care provider. Document Revised: 01/16/2022 Document Reviewed: 01/16/2022 Elsevier Patient Education  2024 [employer].

## 2024-06-27 LAB — BPAM RBC
Blood Product Expiration Date: 202509252359
ISSUE DATE / TIME: 202508251321
Unit Type and Rh: 202509252359
Unit Type and Rh: 5100

## 2024-06-27 LAB — TYPE AND SCREEN
ABO/RH(D): B POS
Antibody Screen: POSITIVE
Donor AG Type: NEGATIVE
Unit division: 0

## 2024-06-29 ENCOUNTER — Inpatient Hospital Stay

## 2024-06-29 VITALS — BP 127/35 | HR 66 | Temp 96.3°F | Resp 18 | Ht 71.0 in | Wt 209.0 lb

## 2024-06-29 DIAGNOSIS — D500 Iron deficiency anemia secondary to blood loss (chronic): Secondary | ICD-10-CM | POA: Diagnosis not present

## 2024-06-29 DIAGNOSIS — D649 Anemia, unspecified: Secondary | ICD-10-CM

## 2024-06-29 LAB — HEMOGLOBIN AND HEMATOCRIT (CANCER CENTER ONLY)
HCT: 28.1 % — ABNORMAL LOW (ref 36.0–46.0)
Hemoglobin: 8.7 g/dL — ABNORMAL LOW (ref 12.0–15.0)

## 2024-06-29 LAB — SAMPLE TO BLOOD BANK

## 2024-06-29 MED ORDER — ONDANSETRON HCL 4 MG/2ML IJ SOLN: 4.0000 mg | Freq: Once | INTRAMUSCULAR | Status: DC

## 2024-06-29 MED ORDER — IRON SUCROSE 20 MG/ML IV SOLN
200.0000 mg | Freq: Once | INTRAVENOUS | Status: AC
  Administered 2024-06-29: 200 mg via INTRAVENOUS
  Filled 2024-06-29: qty 10

## 2024-06-30 ENCOUNTER — Other Ambulatory Visit

## 2024-06-30 ENCOUNTER — Inpatient Hospital Stay

## 2024-06-30 ENCOUNTER — Ambulatory Visit

## 2024-07-04 ENCOUNTER — Encounter
Admission: RE | Disposition: A | Discharge status: Home / Self Care | Payer: Self-pay | Source: Home / Self Care | Attending: Gastroenterology

## 2024-07-04 ENCOUNTER — Ambulatory Visit: Admitting: Anesthesiology

## 2024-07-04 ENCOUNTER — Ambulatory Visit
Admission: RE | Admit: 2024-07-04 | Discharge: 2024-07-04 | Disposition: A | Discharge status: Home / Self Care | Attending: Gastroenterology | Admitting: Gastroenterology

## 2024-07-04 ENCOUNTER — Other Ambulatory Visit: Payer: Self-pay

## 2024-07-04 DIAGNOSIS — F419 Anxiety disorder, unspecified: Secondary | ICD-10-CM | POA: Insufficient documentation

## 2024-07-04 DIAGNOSIS — K31811 Angiodysplasia of stomach and duodenum with bleeding: Secondary | ICD-10-CM | POA: Insufficient documentation

## 2024-07-04 DIAGNOSIS — I5032 Chronic diastolic (congestive) heart failure: Secondary | ICD-10-CM | POA: Insufficient documentation

## 2024-07-04 DIAGNOSIS — K7460 Unspecified cirrhosis of liver: Secondary | ICD-10-CM | POA: Insufficient documentation

## 2024-07-04 DIAGNOSIS — D509 Iron deficiency anemia, unspecified: Secondary | ICD-10-CM | POA: Diagnosis present

## 2024-07-04 DIAGNOSIS — K3189 Other diseases of stomach and duodenum: Secondary | ICD-10-CM | POA: Diagnosis not present

## 2024-07-04 DIAGNOSIS — K766 Portal hypertension: Secondary | ICD-10-CM | POA: Diagnosis not present

## 2024-07-04 DIAGNOSIS — I2510 Atherosclerotic heart disease of native coronary artery without angina pectoris: Secondary | ICD-10-CM | POA: Diagnosis not present

## 2024-07-04 DIAGNOSIS — K7581 Nonalcoholic steatohepatitis (NASH): Secondary | ICD-10-CM | POA: Diagnosis not present

## 2024-07-04 DIAGNOSIS — I130 Hypertensive heart and chronic kidney disease with heart failure and stage 1 through stage 4 chronic kidney disease, or unspecified chronic kidney disease: Secondary | ICD-10-CM | POA: Diagnosis not present

## 2024-07-04 DIAGNOSIS — F319 Bipolar disorder, unspecified: Secondary | ICD-10-CM | POA: Insufficient documentation

## 2024-07-04 DIAGNOSIS — Z87891 Personal history of nicotine dependence: Secondary | ICD-10-CM | POA: Diagnosis not present

## 2024-07-04 DIAGNOSIS — Z79899 Other long term (current) drug therapy: Secondary | ICD-10-CM | POA: Insufficient documentation

## 2024-07-04 DIAGNOSIS — Z7984 Long term (current) use of oral hypoglycemic drugs: Secondary | ICD-10-CM | POA: Insufficient documentation

## 2024-07-04 DIAGNOSIS — N183 Chronic kidney disease, stage 3 unspecified: Secondary | ICD-10-CM | POA: Insufficient documentation

## 2024-07-04 DIAGNOSIS — E1122 Type 2 diabetes mellitus with diabetic chronic kidney disease: Secondary | ICD-10-CM | POA: Diagnosis not present

## 2024-07-04 HISTORY — PX: ESOPHAGOGASTRODUODENOSCOPY: SHX5428

## 2024-07-04 LAB — GLUCOSE, CAPILLARY: Glucose-Capillary: 260 mg/dL — ABNORMAL HIGH (ref 70–99)

## 2024-07-04 SURGERY — EGD (ESOPHAGOGASTRODUODENOSCOPY)
Anesthesia: General

## 2024-07-04 MED ORDER — SODIUM CHLORIDE 0.9 % IV SOLN: INTRAVENOUS | Status: DC

## 2024-07-04 MED ORDER — LIDOCAINE HCL (CARDIAC) PF 100 MG/5ML IV SOSY
PREFILLED_SYRINGE | INTRAVENOUS | Status: DC | PRN
  Administered 2024-07-04: 80 mg via INTRAVENOUS

## 2024-07-04 MED ORDER — PHENYLEPHRINE 80 MCG/ML (10ML) SYRINGE FOR IV PUSH (FOR BLOOD PRESSURE SUPPORT)
PREFILLED_SYRINGE | INTRAVENOUS | Status: DC | PRN
  Administered 2024-07-04: 80 ug via INTRAVENOUS

## 2024-07-04 MED ORDER — EPHEDRINE SULFATE-NACL 50-0.9 MG/10ML-% IV SOSY
PREFILLED_SYRINGE | INTRAVENOUS | Status: DC | PRN
Start: 2024-07-04 — End: 2024-07-04
  Administered 2024-07-04: 5 mg via INTRAVENOUS

## 2024-07-04 MED ORDER — PROPOFOL 10 MG/ML IV BOLUS
INTRAVENOUS | Status: DC | PRN
  Administered 2024-07-04 (×3): 30 mg via INTRAVENOUS
  Administered 2024-07-04: 70 mg via INTRAVENOUS
  Administered 2024-07-04: 30 mg via INTRAVENOUS

## 2024-07-04 NOTE — Interval H&P Note (Signed)
 History and Physical Interval Note:  07/04/2024 9:15 AM  Margaret [name]  has presented today for surgery, with the diagnosis of IDA,portal hypertensive gastropahty.  The various methods of treatment have been discussed with the patient and family. After consideration of risks, benefits and other options for treatment, the patient has consented to  Procedure(s): EGD (ESOPHAGOGASTRODUODENOSCOPY) (N/A) as a surgical intervention.  The patient's history has been reviewed, patient examined, no change in status, stable for surgery.  I have reviewed the patient's chart and labs.  Questions were answered to the patient's satisfaction.     Margaret [name]  Ok to proceed with EGD

## 2024-07-04 NOTE — Anesthesia Postprocedure Evaluation (Signed)
 Anesthesia Post Note  Patient: Secondary school teacher  Procedure(s) Performed: EGD (ESOPHAGOGASTRODUODENOSCOPY)  Patient location during evaluation: PACU Anesthesia Type: General Level of consciousness: awake and alert Pain management: pain level controlled Vital Signs Assessment: post-procedure vital signs reviewed and stable Respiratory status: spontaneous breathing, nonlabored ventilation, respiratory function stable and patient connected to nasal cannula oxygen Cardiovascular status: blood pressure returned to baseline and stable Postop Assessment: no apparent nausea or vomiting Anesthetic complications: no   No notable events documented.   Last Vitals:  Vitals:   07/04/24 0901 07/04/24 0946  BP: (!) 129/53 (!) 125/42  Pulse: 67 70  Resp: 17 19  Temp: 36.6 C   SpO2:  100%    Last Pain:  Vitals:   07/04/24 0946  TempSrc:   PainSc: 0-No pain                 Lynwood KANDICE Clause

## 2024-07-04 NOTE — Op Note (Signed)
 [facility] Patient Name: Margaret [name] Procedure Date: 07/04/2024 8:57 AM MRN: [id number] Account #: [id number] Date of Birth: 15-Sep-1956 Admit Type: Outpatient Age: 68 Room: [facility] ENDO ROOM 1 Gender: Female Note Status: Finalized Instrument Name: Upper GI Scope [id number] Procedure:             Upper GI endoscopy Indications:           Iron  deficiency anemia, Portal hypertensive gastropathy Providers:             Ole Schick MD, MD Referring MD:          Oneil PHEBE Pinal, MD (Referring MD) Medicines:             Monitored Anesthesia Care Complications:         No immediate complications. Procedure:             Pre-Anesthesia Assessment:                        - Prior to the procedure, a History and Physical was                         performed, and patient medications and allergies were                         reviewed. The patient is competent. The risks and                         benefits of the procedure and the sedation options and                         risks were discussed with the patient. All questions                         were answered and informed consent was obtained.                         Patient identification and proposed procedure were                         verified by the physician, the nurse, the                         anesthesiologist, the anesthetist and the technician                         in the endoscopy suite. Mental Status Examination:                         alert and oriented. Airway Examination: normal                         oropharyngeal airway and neck mobility. Respiratory                         Examination: clear to auscultation. CV Examination:                         normal. Prophylactic Antibiotics: The patient does not  require prophylactic antibiotics. Prior                         Anticoagulants: The patient has taken no anticoagulant                         or antiplatelet  agents. ASA Grade Assessment: III - A                         patient with severe systemic disease. After reviewing                         the risks and benefits, the patient was deemed in                         satisfactory condition to undergo the procedure. The                         anesthesia plan was to use monitored anesthesia care                         (MAC). Immediately prior to administration of                         medications, the patient was re-assessed for adequacy                         to receive sedatives. The heart rate, respiratory                         rate, oxygen saturations, blood pressure, adequacy of                         pulmonary ventilation, and response to care were                         monitored throughout the procedure. The physical                         status of the patient was re-assessed after the                         procedure.                        After obtaining informed consent, the endoscope was                         passed under direct vision. Throughout the procedure,                         the patient's blood pressure, pulse, and oxygen                         saturations were monitored continuously. The Endoscope                         was introduced through the mouth, and advanced to the  second part of duodenum. The upper GI endoscopy was                         accomplished without difficulty. The patient tolerated                         the procedure well. Findings:      The examined esophagus was normal.      There is no endoscopic evidence of varices in the entire esophagus.      Moderate portal hypertensive gastropathy was found in the entire       examined stomach.      A single small angioectasia with no bleeding was found on the lesser       curvature of the gastric body. Coagulation for tissue destruction using       argon beam was successful. Estimated blood loss: none.       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       Estimated blood loss: none.      The examined duodenum was normal. Impression:            - Normal esophagus.                        - Portal hypertensive gastropathy.                        - A single non-bleeding angioectasia in the stomach.                         Treated with argon beam coagulation.                        - Gastric antral vascular ectasia with bleeding.                         Treated with argon plasma coagulation (APC).                        - Normal examined duodenum.                        - No specimens collected. Recommendation:        - Discharge patient to home.                        - Resume previous diet.                        - Continue present medications.                        - Return to referring physician as previously                         scheduled.                        - Could consider referral for RFA of GAVE is  conservative therapy with IV iron  is not successful. Procedure Code(s):     --- Professional ---                        [phone], 59, Esophagogastroduodenoscopy, flexible,                         transoral; with ablation of tumor(s), polyp(s), or                         other lesion(s) (includes pre- and post-dilation and                         guide wire passage, when performed) Diagnosis Code(s):     --- Professional ---                        K76.6, Portal hypertension                        K31.89, Other diseases of stomach and duodenum                        K31.811, Angiodysplasia of stomach and duodenum with                         bleeding                        D50.9, Iron  deficiency anemia, unspecified CPT copyright 2022 American Medical Association. All rights reserved. The  codes documented in this report are preliminary and upon coder review may  be revised to meet current compliance requirements. Ole Schick MD, MD 07/04/2024 9:40:16 AM Number of Addenda: 0 Note Initiated On: 07/04/2024 8:57 AM Estimated Blood Loss:  Estimated blood loss: none.      [facility]

## 2024-07-04 NOTE — H&P (Signed)
 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 No blood thinners. No neck surgeries. Last platelet count was 101.    Current Facility-Administered Medications:    0.9 %  sodium chloride  infusion, , Intravenous, Continuous, Cecia Egge, Margaret ONEIDA, MD, Last Rate: 20 mL/hr at 07/04/24 0859, New Bag at 07/04/24 0859  Facility-Administered Medications Ordered in Other Encounters:    sodium chloride  flush (NS) 0.9 % injection 10 mL, 10 mL, Intracatheter, Once PRN, Brahmanday, Govinda R, MD  Medications Prior to Admission  Medication Sig Dispense Refill Last Dose/Taking   amLODipine  (NORVASC ) 5 MG tablet Take 5 mg by mouth daily.   07/03/2024   glimepiride (AMARYL) 2 MG tablet Take 2 mg by mouth every morning.   07/03/2024   HYDROcodone -acetaminophen  (NORCO) 10-325 MG tablet Take 1 tablet by mouth every 8 (eight) hours as needed for severe pain (pain score 7-10). Must last 30 days. 90 tablet 0 07/04/2024 at  5:30 AM   metFORMIN (GLUCOPHAGE) 500 MG tablet Take 500 mg by mouth 2 (two) times daily with a meal.   07/03/2024 at  6:00 PM   ARIPiprazole  (ABILIFY ) 10 MG tablet Take 1 tablet (10 mg total) by mouth daily. 90 tablet 0    escitalopram  (LEXAPRO ) 10 MG tablet TAKE ONE TABLET BY MOUTH ONE TIME DAILY 90 tablet 3    furosemide (LASIX) 20 MG tablet Take 20 mg by mouth daily as needed for fluid or edema.      [START ON 07/31/2024] HYDROcodone -acetaminophen  (NORCO) 10-325 MG tablet Take 1 tablet by mouth every 8 (eight) hours as needed for severe pain (pain score 7-10). Must last 30 days. 90 tablet 0    lamoTRIgine  (LAMICTAL ) 200 MG tablet TAKE ONE TABLET BY MOUTH ONE TIME DAILY 90 tablet 3    lamoTRIgine  (LAMICTAL ) 25 MG tablet Take 1 tablet (25 mg total) by mouth daily. Take daily with 200  mg , total of 225 mg 90 tablet 0    meloxicam (MOBIC) 7.5 MG tablet Take 7.5 mg by mouth. (Patient not taking: Reported on 06/13/2024)      ondansetron  (ZOFRAN ) 8 MG tablet Take 1 tablet (8 mg total) by mouth every 8 (eight) hours as needed for nausea or vomiting. 30 tablet 0    OZEMPIC, 1 MG/DOSE, 4 MG/3ML SOPN Inject 1 mg into the skin once a week. Sundays   06/25/2024   pantoprazole  (PROTONIX ) 40 MG tablet Take 40 mg by mouth every morning.      polyethylene glycol powder (GLYCOLAX/MIRALAX) 17 GM/SCOOP powder Take 17 g by mouth daily.      spironolactone (ALDACTONE) 25 MG tablet Take 25 mg by mouth daily.      tizanidine (ZANAFLEX) 2 MG capsule Take 2 mg by mouth 3 (three) times daily as needed for muscle spasms. Takes differently      XIFAXAN  550 MG TABS tablet Take 550 mg by mouth 2 (two) times daily.        Allergies  Allergen Reactions   Lithium Nausea And Vomiting and Other (See Comments)   Penicillins Hives    Resulted in hospitalization   Sulfa Antibiotics Hives    Resulted in hospitalization   Benzonatate Other (See Comments)     Past Medical History:  Diagnosis Date   (  HFpEF) heart failure with preserved ejection fraction (HCC)    a.) TTE 12/15/2022: EF >55%, mild LVH, G1DD, mild LAE, mild MAC, triv AR/MR/TR, mild TR, RVSP 30.7, mod AS (MPG 20)   Anxiety    Aortic atherosclerosis (HCC)    Aortic stenosis    a.) TTE 12/15/2022: moderate AS (MPG 20 mmHg; AVA = 1.7 cm2)   Bilateral carotid artery disease (HCC)    a.) doppler 12/22/2020: <50% BICA   Bilateral renal cysts    Bipolar 1 disorder (HCC)    CAD (coronary artery disease) 11/04/2022   a.) CT chest 11/04/2022: 3v CAD   Cardiac murmur    CKD (chronic kidney disease), stage III (HCC)    DDD (degenerative disc disease), lumbar    Depression    DM (diabetes mellitus), type 2 (HCC)    Former smoker    Frequent falls    GERD (gastroesophageal reflux disease)    H/O: GI bleed    Heart murmur    History of blood  transfusion    History of MRSA infection 2017   HLD (hyperlipidemia)    Hypertension    IDA (iron  deficiency anemia)    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)    Multiple pulmonary nodules    Nephrolithiasis    Occasional tremors    Osteoarthritis    Osteoporosis    Portal hypertensive gastropathy (HCC)    Portal venous hypertension (HCC)    Renal mass, right 03/30/2023   a.) MRI ABD 03/30/2023: 1.2 x 1.2 cm complex cystic renal cortical mass in the posterior mid to low RIGHT kidney (Bosniak 3); b.) MRI ABD 08/03/2023: interval increase in size to 1.4 x 1.3 cm -> concerning for RCC; c.) US  ABD 10/12/2023: 1.7 complex cystic mass RIGHT kidney; d.) MRI ABD 11/15/2023: interval increase in size to 1.6 x 1.2 x 1.0 cm   Splenomegaly    Thoracic spondylosis    Thrombocytopenia (HCC)    Tubular adenoma of colon    Uses walker    Venous insufficiency    Ventral hernia     Review of systems:  Otherwise negative.    Physical Exam  Gen: Alert, oriented. Appears stated age.  HEENT: PERRLA. Lungs: No respiratory distress CV: RRR Abd: soft, benign, no masses Ext: No edema    Planned procedures: Proceed with EGD. The patient understands the nature of the planned procedure, indications, risks, alternatives and potential complications including but not limited to bleeding, infection, perforation, damage to internal organs and possible oversedation/side effects from anesthesia. The patient agrees and gives consent to proceed.  Please refer to procedure notes for findings, recommendations and patient disposition/instructions.     Margaret ONEIDA Schick, MD [facility] Gastroenterology

## 2024-07-04 NOTE — Anesthesia Preprocedure Evaluation (Signed)
 Anesthesia Evaluation  Patient identified by MRN, date of birth, ID band Patient awake    Reviewed: Allergy & Precautions, H&P , NPO status , Patient's Chart, lab work & pertinent test results, reviewed documented beta blocker date and time   Airway Mallampati: II   Neck ROM: full    Dental  (+) Poor Dentition   Pulmonary neg pulmonary ROS, former smoker   Pulmonary exam normal        Cardiovascular Exercise Tolerance: Poor hypertension, On Medications + CAD  Normal cardiovascular exam+ Valvular Problems/Murmurs  Rhythm:regular Rate:Normal     Neuro/Psych   Anxiety Depression Bipolar Disorder    Neuromuscular disease  negative psych ROS   GI/Hepatic ,GERD  ,,(+) Hepatitis -  Endo/Other  negative endocrine ROSdiabetes, Well Controlled    Renal/GU Renal disease  negative genitourinary   Musculoskeletal   Abdominal   Peds  Hematology  (+) Blood dyscrasia, anemia   Anesthesia Other Findings Past Medical History: No date: (HFpEF) heart failure with preserved ejection fraction (HCC)     Comment:  a.) TTE 12/15/2022: EF >55%, mild LVH, G1DD, mild LAE,               mild MAC, triv AR/MR/TR, mild TR, RVSP 30.7, mod AS (MPG               20) No date: Anxiety No date: Aortic atherosclerosis (HCC) No date: Aortic stenosis     Comment:  a.) TTE 12/15/2022: moderate AS (MPG 20 mmHg; AVA = 1.7               cm2) No date: Bilateral carotid artery disease (HCC)     Comment:  a.) doppler 12/22/2020: <50% BICA No date: Bilateral renal cysts No date: Bipolar 1 disorder (HCC) 11/04/2022: CAD (coronary artery disease)     Comment:  a.) CT chest 11/04/2022: 3v CAD No date: Cardiac murmur No date: CKD (chronic kidney disease), stage III (HCC) No date: DDD (degenerative disc disease), lumbar No date: Depression No date: DM (diabetes mellitus), type 2 (HCC) No date: Former smoker No date: Frequent falls No date: GERD  (gastroesophageal reflux disease) No date: H/O: GI bleed No date: Heart murmur No date: History of blood transfusion 2017: History of MRSA infection No date: HLD (hyperlipidemia) No date: Hypertension No date: IDA (iron  deficiency anemia) No date: 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H (HCC) No date: Multiple pulmonary nodules No date: Nephrolithiasis No date: Occasional tremors No date: Osteoarthritis No date: Osteoporosis No date: Portal hypertensive gastropathy (HCC) No date: Portal venous hypertension (HCC) 03/30/2023: Renal mass, right     Comment:  a.) MRI ABD 03/30/2023: 1.2 x 1.2 cm complex cystic               renal cortical mass in the posterior mid to low RIGHT               kidney (Bosniak 3); b.) MRI ABD 08/03/2023: interval               increase in size to 1.4 x 1.3 cm -> concerning for RCC;               c.) US  ABD 10/12/2023: 1.7 complex cystic mass RIGHT               kidney; d.) MRI ABD 11/15/2023: interval increase in size              to 1.6 x 1.2 x 1.0 cm No date: Splenomegaly  No date: Thoracic spondylosis No date: Thrombocytopenia (HCC) No date: Tubular adenoma of colon No date: Uses walker No date: Venous insufficiency No date: Ventral hernia Past Surgical History: No date: ABDOMINAL HYSTERECTOMY No date: CHOLECYSTECTOMY 02/15/2024: COLONOSCOPY; N/A     Comment:  Procedure: COLONOSCOPY;  Surgeon: Jinny Carmine, MD;                Location: [facility] ENDOSCOPY;  Service: Endoscopy;                Laterality: N/A; 02/15/2024: COLONOSCOPY     Comment:  Procedure: COLONOSCOPY, WITH ARGON PLASMA COAGULATION;                Surgeon: Jinny Carmine, MD;  Location: [facility] ENDOSCOPY;                Service: Endoscopy;; 06/23/2019: COLONOSCOPY WITH PROPOFOL ; N/A     Comment:  Procedure: COLONOSCOPY WITH PROPOFOL ;  Surgeon: Therisa Bi, MD;  Location: [facility] ENDOSCOPY;  Service:               Gastroenterology;  Laterality: N/A; 12/08/2022: COLONOSCOPY WITH  PROPOFOL ; N/A     Comment:  Procedure: COLONOSCOPY WITH PROPOFOL ;  Surgeon:               Maryruth Ole DASEN, MD;  Location: [facility] ENDOSCOPY;                Service: Endoscopy;  Laterality: N/A; 05/18/2023: COLONOSCOPY WITH PROPOFOL ; N/A     Comment:  Procedure: COLONOSCOPY WITH PROPOFOL ;  Surgeon:               Maryruth Ole DASEN, MD;  Location: [facility] ENDOSCOPY;                Service: Endoscopy;  Laterality: N/A; 02/15/2024: ESOPHAGOGASTRODUODENOSCOPY; N/A     Comment:  Procedure: EGD (ESOPHAGOGASTRODUODENOSCOPY);  Surgeon:               Jinny Carmine, MD;  Location: [facility] ENDOSCOPY;  Service:               Endoscopy;  Laterality: N/A; 06/23/2019: ESOPHAGOGASTRODUODENOSCOPY (EGD) WITH PROPOFOL ; N/A     Comment:  Procedure: ESOPHAGOGASTRODUODENOSCOPY (EGD) WITH               PROPOFOL ;  Surgeon: Therisa Bi, MD;  Location: [facility]               ENDOSCOPY;  Service: Gastroenterology;  Laterality: N/A; 12/08/2022: ESOPHAGOGASTRODUODENOSCOPY (EGD) WITH PROPOFOL ; N/A     Comment:  Procedure: ESOPHAGOGASTRODUODENOSCOPY (EGD) WITH               PROPOFOL ;  Surgeon: Maryruth Ole DASEN, MD;  Location:               [facility] ENDOSCOPY;  Service: Endoscopy;  Laterality: N/A;                DM, OZEMPIC 01/12/2023: ESOPHAGOGASTRODUODENOSCOPY (EGD) WITH PROPOFOL ; N/A     Comment:  Procedure: ESOPHAGOGASTRODUODENOSCOPY (EGD) WITH               PROPOFOL ;  Surgeon: Maryruth Ole DASEN, MD;  Location:               [facility] ENDOSCOPY;  Service: Endoscopy;  Laterality: N/A;                DM 02/15/2024: GIVENS CAPSULE STUDY     Comment:  Procedure: IMAGING PROCEDURE, GI TRACT, INTRALUMINAL,               VIA CAPSULE;  Surgeon: Jinny Carmine, MD;  Location: [facility]               ENDOSCOPY;  Service: Endoscopy;; 02/15/2024: HOT HEMOSTASIS     Comment:  Procedure: EGD, WITH ARGON PLASMA COAGULATION;  Surgeon:              Jinny Carmine, MD;  Location: [facility] ENDOSCOPY;  Service:               Endoscopy;; 10/28/2023: IR  RADIOLOGIST EVAL & MGMT 01/18/2024: IR RADIOLOGIST EVAL & MGMT 05/18/2023: POLYPECTOMY     Comment:  Procedure: POLYPECTOMY;  Surgeon: Maryruth Ole DASEN,               MD;  Location: [facility] ENDOSCOPY;  Service: Endoscopy;; 02/15/2024: POLYPECTOMY     Comment:  Procedure: POLYPECTOMY, INTESTINE;  Surgeon: Jinny Carmine, MD;  Location: [facility] ENDOSCOPY;  Service:               Endoscopy;; No date: TONSILLECTOMY 2017: TOTAL HIP ARTHROPLASTY; Right   Reproductive/Obstetrics negative OB ROS                              Anesthesia Physical Anesthesia Plan  ASA: 4  Anesthesia Plan: General   Post-op Pain Management:    Induction:   PONV Risk Score and Plan:   Airway Management Planned:   Additional Equipment:   Intra-op Plan:   Post-operative Plan:   Informed Consent: I have reviewed the patients History and Physical, chart, labs and discussed the procedure including the risks, benefits and alternatives for the proposed anesthesia with the patient or authorized representative who has indicated his/her understanding and acceptance.     Dental Advisory Given  Plan Discussed with: CRNA  Anesthesia Plan Comments:         Anesthesia Quick Evaluation

## 2024-07-04 NOTE — Transfer of Care (Signed)
 Immediate Anesthesia Transfer of Care Note  Patient: Margaret [name]  Procedure(s) Performed: EGD (ESOPHAGOGASTRODUODENOSCOPY)  Patient Location: PACU and Endoscopy Unit  Anesthesia Type:General  Level of Consciousness: awake, drowsy, and patient cooperative  Airway & Oxygen Therapy: Patient Spontanous Breathing and Patient connected to nasal cannula oxygen  Post-op Assessment: Report given to RN, Post -op Vital signs reviewed and stable, and Patient moving all extremities  Post vital signs: Reviewed and stable See vitals flowsheet for vital signs - Epic not transferring at this time.  Last Vitals:  Vitals Value Taken Time  BP    Temp    Pulse    Resp    SpO2      Last Pain:  Vitals:   07/04/24 0901  TempSrc: Temporal  PainSc: Asleep         Complications: No notable events documented.

## 2024-07-05 ENCOUNTER — Telehealth: Payer: Self-pay

## 2024-07-05 ENCOUNTER — Encounter: Payer: Self-pay | Admitting: Gastroenterology

## 2024-07-05 DIAGNOSIS — F3161 Bipolar disorder, current episode mixed, mild: Secondary | ICD-10-CM

## 2024-07-05 DIAGNOSIS — F418 Other specified anxiety disorders: Secondary | ICD-10-CM

## 2024-07-05 NOTE — Telephone Encounter (Signed)
 Received fax from patients pharmacy requesting a refill of lamoTRIgine  (LAMICTAL ) 25 MG tablet   Last visit 06-19-24 Next visit 09-21-24     Preferred Pharmacies   [facility] [address] - [place], [place] - [address] AT [address] Phone: [phone]  Fax: [phone]

## 2024-07-06 MED ORDER — LAMOTRIGINE 25 MG PO TABS: 25.0000 mg | ORAL_TABLET | Freq: Every day | ORAL | 1 refills | Status: AC

## 2024-07-06 NOTE — Addendum Note (Signed)
 Addended byBETHA COBY HEIGHT on: 07/06/2024 05:03 PM   Modules accepted: Orders

## 2024-07-06 NOTE — Telephone Encounter (Signed)
 I have sent Lamictal  25 mg to pharmacy as requested.

## 2024-07-07 ENCOUNTER — Other Ambulatory Visit: Payer: Self-pay | Admitting: *Deleted

## 2024-07-07 ENCOUNTER — Telehealth: Payer: Self-pay | Admitting: *Deleted

## 2024-07-07 ENCOUNTER — Inpatient Hospital Stay: Attending: Internal Medicine

## 2024-07-07 DIAGNOSIS — D649 Anemia, unspecified: Secondary | ICD-10-CM

## 2024-07-07 DIAGNOSIS — D500 Iron deficiency anemia secondary to blood loss (chronic): Secondary | ICD-10-CM

## 2024-07-07 DIAGNOSIS — K922 Gastrointestinal hemorrhage, unspecified: Secondary | ICD-10-CM | POA: Diagnosis present

## 2024-07-07 LAB — SAMPLE TO BLOOD BANK

## 2024-07-07 LAB — CBC WITH DIFFERENTIAL (CANCER CENTER ONLY)
Abs Immature Granulocytes: 0.01 K/uL (ref 0.00–0.07)
Basophils Absolute: 0 K/uL (ref 0.0–0.1)
Basophils Relative: 0 %
Eosinophils Absolute: 0 K/uL (ref 0.0–0.5)
Eosinophils Relative: 1 %
HCT: 24.7 % — ABNORMAL LOW (ref 36.0–46.0)
Hemoglobin: 7.6 g/dL — ABNORMAL LOW (ref 12.0–15.0)
Immature Granulocytes: 0 %
Lymphocytes Relative: 7 %
Lymphs Abs: 0.2 K/uL — ABNORMAL LOW (ref 0.7–4.0)
MCH: 27.9 pg (ref 26.0–34.0)
MCHC: 30.8 g/dL (ref 30.0–36.0)
MCV: 90.8 fL (ref 80.0–100.0)
Monocytes Absolute: 0.4 K/uL (ref 0.1–1.0)
Monocytes Relative: 15 %
Neutro Abs: 2.2 K/uL (ref 1.7–7.7)
Neutrophils Relative %: 77 %
Platelet Count: 85 K/uL — ABNORMAL LOW (ref 150–400)
RBC: 2.72 MIL/uL — ABNORMAL LOW (ref 3.87–5.11)
RDW: 17.2 % — ABNORMAL HIGH (ref 11.5–15.5)
WBC Count: 2.9 K/uL — ABNORMAL LOW (ref 4.0–10.5)
nRBC: 0 % (ref 0.0–0.2)

## 2024-07-07 LAB — FERRITIN: Ferritin: 102 ng/mL (ref 11–307)

## 2024-07-07 LAB — IRON AND TIBC
Iron: 26 ug/dL — ABNORMAL LOW (ref 28–170)
Saturation Ratios: 6 % — ABNORMAL LOW (ref 10.4–31.8)
TIBC: 442 ug/dL (ref 250–450)
UIBC: 416 ug/dL

## 2024-07-07 NOTE — Telephone Encounter (Signed)
 The patient called and said that she feels like she needs more IV iron  she wants to have labs checked after 1 pm early afternoon.call her and get a lab encounter and she wants it 1:00 or later.  He did get IV iron  on 8/12 8/15 8/22 8/28.  Also in between some of those days she came in for just IV fluids also. I spoke to the patient and she said that she is very fatigued and sleep and sleep does not have any energy to do things.

## 2024-07-08 LAB — PREPARE RBC (CROSSMATCH)

## 2024-07-10 ENCOUNTER — Inpatient Hospital Stay

## 2024-07-10 DIAGNOSIS — D649 Anemia, unspecified: Secondary | ICD-10-CM

## 2024-07-10 DIAGNOSIS — D500 Iron deficiency anemia secondary to blood loss (chronic): Secondary | ICD-10-CM | POA: Diagnosis not present

## 2024-07-10 MED ORDER — ACETAMINOPHEN 325 MG PO TABS
650.0000 mg | ORAL_TABLET | Freq: Once | ORAL | Status: AC
  Administered 2024-07-10: 325 mg via ORAL
  Filled 2024-07-10: qty 2

## 2024-07-10 MED ORDER — DIPHENHYDRAMINE HCL 25 MG PO CAPS
25.0000 mg | ORAL_CAPSULE | Freq: Once | ORAL | Status: AC
  Administered 2024-07-10: 25 mg via ORAL
  Filled 2024-07-10: qty 1

## 2024-07-10 MED ORDER — PROCHLORPERAZINE EDISYLATE 10 MG/2ML IJ SOLN
10.0000 mg | Freq: Once | INTRAMUSCULAR | Status: AC
  Administered 2024-07-10: 10 mg via INTRAVENOUS
  Filled 2024-07-10: qty 2

## 2024-07-10 MED ORDER — SODIUM CHLORIDE 0.9% IV SOLUTION
250.0000 mL | INTRAVENOUS | Status: DC
  Administered 2024-07-10: 100 mL via INTRAVENOUS
  Filled 2024-07-10: qty 250

## 2024-07-11 LAB — BPAM RBC
Blood Product Expiration Date: 202510122359
ISSUE DATE / TIME: 202509080829
Unit Type and Rh: 5100

## 2024-07-11 LAB — TYPE AND SCREEN
ABO/RH(D): B POS
Antibody Screen: POSITIVE
Donor AG Type: NEGATIVE
Unit division: 0

## 2024-07-13 ENCOUNTER — Encounter: Payer: Self-pay | Admitting: Nurse Practitioner

## 2024-07-13 ENCOUNTER — Inpatient Hospital Stay

## 2024-07-13 ENCOUNTER — Other Ambulatory Visit: Payer: Self-pay

## 2024-07-13 ENCOUNTER — Inpatient Hospital Stay (HOSPITAL_BASED_OUTPATIENT_CLINIC_OR_DEPARTMENT_OTHER): Admitting: Nurse Practitioner

## 2024-07-13 VITALS — BP 131/40 | HR 66 | Temp 99.7°F | Resp 16 | Wt 181.0 lb

## 2024-07-13 VITALS — BP 112/42 | HR 62 | Temp 97.0°F | Resp 18

## 2024-07-13 DIAGNOSIS — D500 Iron deficiency anemia secondary to blood loss (chronic): Secondary | ICD-10-CM

## 2024-07-13 DIAGNOSIS — D649 Anemia, unspecified: Secondary | ICD-10-CM

## 2024-07-13 LAB — CBC WITH DIFFERENTIAL/PLATELET
Abs Immature Granulocytes: 0.03 K/uL (ref 0.00–0.07)
Basophils Absolute: 0 K/uL (ref 0.0–0.1)
Basophils Relative: 0 %
Eosinophils Absolute: 0 K/uL (ref 0.0–0.5)
Eosinophils Relative: 0 %
HCT: 28.5 % — ABNORMAL LOW (ref 36.0–46.0)
Hemoglobin: 8.9 g/dL — ABNORMAL LOW (ref 12.0–15.0)
Immature Granulocytes: 1 %
Lymphocytes Relative: 12 %
Lymphs Abs: 0.3 K/uL — ABNORMAL LOW (ref 0.7–4.0)
MCH: 28.2 pg (ref 26.0–34.0)
MCHC: 31.2 g/dL (ref 30.0–36.0)
MCV: 90.2 fL (ref 80.0–100.0)
Monocytes Absolute: 0.2 K/uL (ref 0.1–1.0)
Monocytes Relative: 9 %
Neutro Abs: 2 K/uL (ref 1.7–7.7)
Neutrophils Relative %: 78 %
Platelets: 73 K/uL — ABNORMAL LOW (ref 150–400)
RBC: 3.16 MIL/uL — ABNORMAL LOW (ref 3.87–5.11)
RDW: 17.2 % — ABNORMAL HIGH (ref 11.5–15.5)
WBC: 2.6 K/uL — ABNORMAL LOW (ref 4.0–10.5)
nRBC: 0 % (ref 0.0–0.2)

## 2024-07-13 LAB — IRON AND TIBC
Iron: 25 ug/dL — ABNORMAL LOW (ref 28–170)
Saturation Ratios: 6 % — ABNORMAL LOW (ref 10.4–31.8)
TIBC: 412 ug/dL (ref 250–450)
UIBC: 387 ug/dL

## 2024-07-13 LAB — SAMPLE TO BLOOD BANK

## 2024-07-13 LAB — FERRITIN: Ferritin: 140 ng/mL (ref 11–307)

## 2024-07-13 MED ORDER — SODIUM CHLORIDE 0.9% FLUSH
10.0000 mL | Freq: Once | INTRAVENOUS | Status: AC | PRN
  Administered 2024-07-13: 10 mL
  Filled 2024-07-13: qty 10

## 2024-07-13 MED ORDER — IRON SUCROSE 20 MG/ML IV SOLN
200.0000 mg | Freq: Once | INTRAVENOUS | Status: AC
  Administered 2024-07-13: 200 mg via INTRAVENOUS
  Filled 2024-07-13: qty 10

## 2024-07-13 NOTE — Progress Notes (Signed)
 [facility] CONSULT NOTE  Patient Care Team: Cleotilde Oneil FALCON, MD as PCP - General (Internal Medicine) Rennie Cindy SAUNDERS, MD as Consulting Physician (Internal Medicine) Maryruth Ole DASEN, MD as Consulting Physician (Gastroenterology)  CHIEF COMPLAINTS/PURPOSE OF CONSULTATION: Anemia/thrombocytopenia  HEMATOLOGY HISTORY  06/21/2019-06/24/2019 due to severe anemia; hemoglobin of 5.7 upon admission; patient received 2 units of blood transfusion, 3 doses of IV iron . EGD and colonoscopy were performed during the hospitalization EGD and colonoscopy on 06/23/2019. [Dr.Anna; [facility]] EGD showed no abnormalities and the colonoscopy showed 3 sessile diminutive polyps which were resected completely.  Arteries of the duodenum were normal.  3 colon polyps resected were serrated polyp x2 and a tubular adenoma x1. No active source of bleeding was noted.    # Cirrhosis NAFLD- [? [facility]]  She saw Dr Rennie on 02/14/24 and was found to have hemoglobin 5.1 with significant symptoms. She went to ER and was admitted. On 02/15/24, she underwent EGD with Dr Jinny which revealed LA  Grade A esophagitis w/o bleeding. Biopsied. 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 She also underwent colonoscopy which revealed single nonbleeding colonic angiodysplastic lesion treated with APC. 2 small polyps were removed. She also underwent capsule study. She received several blood transfusions. She was discharged home on 02/16/24.   HISTORY OF PRESENTING ILLNESS: ambulating with rolling walker [arthritis].   Margaret [name] 68 y.o. 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   She last received IV iron  on 06/29/2024.  Complains of sinus pressure cough, congestion.  Increasing headache and dental  discomfort.  She feels weak and not like herself.   Review of Systems  Constitutional:  Positive for chills and malaise/fatigue. Negative for diaphoresis, fever and weight loss.  HENT:  Positive for congestion, sinus pain and sore throat.   Respiratory:  Positive for cough. Negative for hemoptysis, sputum production, shortness of breath and wheezing.   Cardiovascular:  Negative for chest pain, palpitations, orthopnea and leg swelling.  Gastrointestinal:  Positive for melena. Negative for abdominal pain, blood in stool, constipation, diarrhea, heartburn, nausea and vomiting.  Genitourinary:  Negative for dysuria, hematuria and urgency.  Musculoskeletal:  Positive for back pain, falls and joint pain.  Skin:  Negative for itching and rash.  Neurological:  Positive for weakness. Negative for dizziness, tingling, focal weakness and headaches.  Endo/Heme/Allergies:  Does not bruise/bleed easily.  Psychiatric/Behavioral:  Negative for depression. The patient is not nervous/anxious and does not have insomnia.    MEDICAL HISTORY:  Past Medical History:  Diagnosis Date   (HFpEF) heart failure with preserved ejection fraction (HCC)    a.) TTE 12/15/2022: EF >55%, mild LVH, G1DD, mild LAE, mild MAC, triv AR/MR/TR, mild TR, RVSP 30.7, mod AS (MPG 20)   Anxiety    Aortic atherosclerosis (HCC)    Aortic stenosis    a.) TTE 12/15/2022: moderate AS (MPG 20 mmHg; AVA = 1.7 cm2)   Bilateral carotid artery disease (HCC)    a.) doppler 12/22/2020: <50% BICA   Bilateral renal cysts    Bipolar 1 disorder (HCC)    CAD (coronary artery disease) 11/04/2022   a.) CT chest 11/04/2022: 3v CAD   Cardiac murmur    CKD (chronic kidney disease), stage III (HCC)    DDD (degenerative disc disease), lumbar    Depression    DM (diabetes mellitus),  type 2 [facility])    Former smoker    Frequent falls    GERD (gastroesophageal reflux disease)    H/O: GI bleed    Heart murmur    History of blood transfusion    History  of MRSA infection 2017   HLD (hyperlipidemia)    Hypertension    IDA (iron  deficiency anemia)    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)    Multiple pulmonary nodules    Nephrolithiasis    Occasional tremors    Osteoarthritis    Osteoporosis    Portal hypertensive gastropathy (HCC)    Portal venous hypertension (HCC)    Renal mass, right 03/30/2023   a.) MRI ABD 03/30/2023: 1.2 x 1.2 cm complex cystic renal cortical mass in the posterior mid to low RIGHT kidney (Bosniak 3); b.) MRI ABD 08/03/2023: interval increase in size to 1.4 x 1.3 cm -> concerning for RCC; c.) US  ABD 10/12/2023: 1.7 complex cystic mass RIGHT kidney; d.) MRI ABD 11/15/2023: interval increase in size to 1.6 x 1.2 x 1.0 cm   Splenomegaly    Thoracic spondylosis    Thrombocytopenia (HCC)    Tubular adenoma of colon    Uses walker    Venous insufficiency    Ventral hernia    SURGICAL HISTORY: Past Surgical History:  Procedure Laterality Date   ABDOMINAL HYSTERECTOMY     CHOLECYSTECTOMY     COLONOSCOPY N/A 02/15/2024   Procedure: COLONOSCOPY;  Surgeon: Jinny Carmine, MD;  Location: [facility] ENDOSCOPY;  Service: Endoscopy;  Laterality: N/A;   COLONOSCOPY  02/15/2024   Procedure: COLONOSCOPY, WITH ARGON PLASMA COAGULATION;  Surgeon: Jinny Carmine, MD;  Location: [facility] ENDOSCOPY;  Service: Endoscopy;;   COLONOSCOPY WITH PROPOFOL  N/A 06/23/2019   Procedure: COLONOSCOPY WITH PROPOFOL ;  Surgeon: Therisa Bi, MD;  Location: [facility] ENDOSCOPY;  Service: Gastroenterology;  Laterality: N/A;   COLONOSCOPY WITH PROPOFOL  N/A 12/08/2022   Procedure: COLONOSCOPY WITH PROPOFOL ;  Surgeon: Maryruth Ole DASEN, MD;  Location: [facility] ENDOSCOPY;  Service: Endoscopy;  Laterality: N/A;   COLONOSCOPY WITH PROPOFOL  N/A 05/18/2023   Procedure: COLONOSCOPY WITH PROPOFOL ;  Surgeon: Maryruth Ole DASEN, MD;  Location: [facility] ENDOSCOPY;  Service: Endoscopy;  Laterality: N/A;   ESOPHAGOGASTRODUODENOSCOPY N/A 02/15/2024   Procedure: EGD  (ESOPHAGOGASTRODUODENOSCOPY);  Surgeon: Jinny Carmine, MD;  Location: [facility] ENDOSCOPY;  Service: Endoscopy;  Laterality: N/A;   ESOPHAGOGASTRODUODENOSCOPY N/A 07/04/2024   Procedure: EGD (ESOPHAGOGASTRODUODENOSCOPY);  Surgeon: Maryruth Ole DASEN, MD;  Location: [facility] ENDOSCOPY;  Service: Endoscopy;  Laterality: N/A;   ESOPHAGOGASTRODUODENOSCOPY (EGD) WITH PROPOFOL  N/A 06/23/2019   Procedure: ESOPHAGOGASTRODUODENOSCOPY (EGD) WITH PROPOFOL ;  Surgeon: Therisa Bi, MD;  Location: [facility] ENDOSCOPY;  Service: Gastroenterology;  Laterality: N/A;   ESOPHAGOGASTRODUODENOSCOPY (EGD) WITH PROPOFOL  N/A 12/08/2022   Procedure: ESOPHAGOGASTRODUODENOSCOPY (EGD) WITH PROPOFOL ;  Surgeon: Maryruth Ole DASEN, MD;  Location: [facility] ENDOSCOPY;  Service: Endoscopy;  Laterality: N/A;  DM, OZEMPIC   ESOPHAGOGASTRODUODENOSCOPY (EGD) WITH PROPOFOL  N/A 01/12/2023   Procedure: ESOPHAGOGASTRODUODENOSCOPY (EGD) WITH PROPOFOL ;  Surgeon: Maryruth Ole DASEN, MD;  Location: [facility] ENDOSCOPY;  Service: Endoscopy;  Laterality: N/A;  DM   GIVENS CAPSULE STUDY  02/15/2024   Procedure: IMAGING PROCEDURE, GI TRACT, INTRALUMINAL, VIA CAPSULE;  Surgeon: Jinny Carmine, MD;  Location: [facility] ENDOSCOPY;  Service: Endoscopy;;   HOT HEMOSTASIS  02/15/2024   Procedure: EGD, WITH ARGON PLASMA COAGULATION;  Surgeon: Jinny Carmine, MD;  Location: [facility] ENDOSCOPY;  Service: Endoscopy;;   IR RADIOLOGIST EVAL & MGMT  10/28/2023   IR RADIOLOGIST EVAL & MGMT  01/18/2024   POLYPECTOMY  05/18/2023   Procedure: POLYPECTOMY;  Surgeon: Maryruth Ole DASEN, MD;  Location: [facility] ENDOSCOPY;  Service: Endoscopy;;   POLYPECTOMY  02/15/2024   Procedure: POLYPECTOMY, INTESTINE;  Surgeon: Jinny Carmine, MD;  Location: [facility] ENDOSCOPY;  Service: Endoscopy;;   TONSILLECTOMY     TOTAL HIP ARTHROPLASTY Right 2017   SOCIAL HISTORY: Social History   Socioeconomic History   Marital status: Divorced    Spouse name: Not on file   Number of children: 2   Years of education: Not on  file   Highest education level: Not on file  Occupational History   Not on file  Tobacco Use   Smoking status: Former    Current packs/day: 0.00    Types: Cigarettes    Quit date: 03/03/2011    Years since quitting: 13.3   Smokeless tobacco: Never  Vaping Use   Vaping status: Never Used  Substance and Sexual Activity   Alcohol use: Yes    Comment: rarely   Drug use: Not Currently   Sexual activity: Not Currently  Other Topics Concern   Not on file  Social History Narrative   Not on file   Social Drivers of Health   Financial Resource Strain: Low Risk  (05/04/2024)   Received from [facility]   Overall Financial Resource Strain (CARDIA)    Difficulty of Paying Living Expenses: Not hard at all  Food Insecurity: No Food Insecurity (05/04/2024)   Received from [facility]   Hunger Vital Sign    Within the past 12 months, you worried that your food would run out before you got the money to buy more.: Never true    Within the past 12 months, the food you bought just didn't last and you didn't have money to get more.: Never true  Transportation Needs: No Transportation Needs (05/04/2024)   Received from [facility] - Transportation    In the past 12 months, has lack of transportation kept you from medical appointments or from getting medications?: No    Lack of Transportation (Non-Medical): No  Physical Activity: Inactive (06/16/2021)   Received from [facility]   Exercise Vital Sign    On average, how many days per week do you engage in moderate to strenuous exercise (like a brisk walk)?: 0 days    On average, how many minutes do you engage in exercise at this level?: 0 min  Stress: No Stress Concern Present (06/16/2021)   Received from [facility] - Occupational Stress Questionnaire  Social Connections: Moderately Integrated (02/14/2024)    Social Connection and Isolation Panel    Frequency of Communication with Friends and Family: More than three times a week    Frequency of Social Gatherings with Friends and Family: More than three times a week    Attends Religious Services: More than 4 times per year    Active Member of [employer] or Organizations: Yes    Attends Banker Meetings: More than 4 times per year    Marital Status: Divorced  Intimate Partner Violence: Not At Risk (02/14/2024)   Humiliation, Afraid, Rape, and Kick questionnaire    Fear of Current or Ex-Partner: No    Emotionally Abused: No    Physically Abused: No    Sexually Abused: No   FAMILY HISTORY: Family History  Problem Relation Age of Onset   Lung cancer Mother    Heart attack  Father 43   Mental illness Father    Bipolar disorder Brother    Bipolar disorder Paternal Uncle    Bipolar disorder Paternal Uncle    ALLERGIES:  is allergic to lithium, penicillins, sulfa antibiotics, and benzonatate.  MEDICATIONS:  Current Outpatient Medications  Medication Sig Dispense Refill   amLODipine  (NORVASC ) 5 MG tablet Take 5 mg by mouth daily.     ARIPiprazole  (ABILIFY ) 10 MG tablet Take 1 tablet (10 mg total) by mouth daily. 90 tablet 0   escitalopram  (LEXAPRO ) 10 MG tablet TAKE ONE TABLET BY MOUTH ONE TIME DAILY 90 tablet 3   furosemide (LASIX) 20 MG tablet Take 20 mg by mouth daily as needed for fluid or edema.     glimepiride (AMARYL) 2 MG tablet Take 2 mg by mouth every morning.     HYDROcodone -acetaminophen  (NORCO) 10-325 MG tablet Take 1 tablet by mouth every 8 (eight) hours as needed for severe pain (pain score 7-10). Must last 30 days. 90 tablet 0   [START ON 07/31/2024] HYDROcodone -acetaminophen  (NORCO) 10-325 MG tablet Take 1 tablet by mouth every 8 (eight) hours as needed for severe pain (pain score 7-10). Must last 30 days. 90 tablet 0   lamoTRIgine  (LAMICTAL ) 200 MG tablet TAKE ONE TABLET BY MOUTH ONE TIME DAILY 90 tablet 3   lamoTRIgine   (LAMICTAL ) 25 MG tablet Take 1 tablet (25 mg total) by mouth daily. Take daily with 200 mg , total of 225 mg 90 tablet 1   meloxicam (MOBIC) 7.5 MG tablet Take 7.5 mg by mouth. (Patient not taking: Reported on 06/13/2024)     metFORMIN (GLUCOPHAGE) 500 MG tablet Take 500 mg by mouth 2 (two) times daily with a meal.     ondansetron  (ZOFRAN ) 8 MG tablet Take 1 tablet (8 mg total) by mouth every 8 (eight) hours as needed for nausea or vomiting. 30 tablet 0   OZEMPIC, 1 MG/DOSE, 4 MG/3ML SOPN Inject 1 mg into the skin once a week. Sundays     pantoprazole  (PROTONIX ) 40 MG tablet Take 40 mg by mouth every morning.     polyethylene glycol powder (GLYCOLAX/MIRALAX) 17 GM/SCOOP powder Take 17 g by mouth daily.     spironolactone (ALDACTONE) 25 MG tablet Take 25 mg by mouth daily.     tizanidine (ZANAFLEX) 2 MG capsule Take 2 mg by mouth 3 (three) times daily as needed for muscle spasms. Takes differently     XIFAXAN  550 MG TABS tablet Take 550 mg by mouth 2 (two) times daily.     No current facility-administered medications for this visit.   Facility-Administered Medications Ordered in Other Visits  Medication Dose Route Frequency Provider Last Rate Last Admin   sodium chloride  flush (NS) 0.9 % injection 10 mL  10 mL Intracatheter Once PRN Brahmanday, Govinda R, MD        PHYSICAL EXAMINATION: Vitals:   07/13/24 1509  BP: (!) 131/40  Pulse: 66  Resp: 16  Temp: 99.7 F (37.6 C)  SpO2: 97%   Filed Weights   07/13/24 1509  Weight: 181 lb (82.1 kg)   Physical Exam Vitals reviewed.  Constitutional:      Appearance: She is not ill-appearing.     Comments: Fatigued appearing.  Accompanied.  HENT:     Right Ear: Tympanic membrane, ear canal and external ear normal.     Left Ear: Tympanic membrane, ear canal and external ear normal.     Nose: Congestion and rhinorrhea present.     Mouth/Throat:  Pharynx: Posterior oropharyngeal erythema present.  Eyes:     General: No scleral  icterus. Cardiovascular:     Rate and Rhythm: Normal rate and regular rhythm.  Pulmonary:     Effort: No respiratory distress.  Abdominal:     General: There is no distension.     Palpations: Abdomen is soft.     Tenderness: There is no abdominal tenderness. There is no guarding.  Musculoskeletal:     Comments: Walking cast rle  Skin:    General: Skin is warm and dry.  Neurological:     Mental Status: She is alert and oriented to person, place, and time.  Psychiatric:        Mood and Affect: Mood normal.        Behavior: Behavior normal.     LABORATORY DATA:  I have reviewed the data as listed Lab Results  Component Value Date   WBC 2.9 (L) 07/07/2024   HGB 7.6 (L) 07/07/2024   HCT 24.7 (L) 07/07/2024   MCV 90.8 07/07/2024   PLT 85 (L) 07/07/2024   Iron /TIBC/Ferritin/ %Sat    Component Value Date/Time   IRON  26 (L) 07/07/2024 1324   IRON  37 12/06/2019 1507   TIBC 442 07/07/2024 1324   TIBC 428 12/06/2019 1507   FERRITIN 102 07/07/2024 1324   FERRITIN 45 12/06/2019 1507   IRONPCTSAT 6 (L) 07/07/2024 1324   IRONPCTSAT 9 (LL) 12/06/2019 1507   No results found.  ASSESSMENT & PLAN:   Symptomatic anemia # [2020] History of iron  deficiency anemia secondary to chronic GI bleed/cirrhosis; GAVE [EGD in G+FEB 2024].  FEB 2024- Iron  sat 9 Ferritin 45- wnl-PCP; Hb 9-10.  s/p IV iron  x4 [JAN 2024]- MM panel-WNL.   # April 2025- Hmg 5.1. Ferritin 8, Iron  sat 8%. S/p hospitalization and transfusions. She has been receiving IV iron  nearly weekly and counts ranging from 7-8. Last received IV iron  on August 2025.  # Today, hemoglobin improved to 8.9.  Hold transfusion for tomorrow.  Proceed with Venofer  today.  Goal to keep hemoglobin greater than 7.    # Etiology: Cirrhosis/GI bleed FEB 2024- GAVE -non-bleeding-; colo-? Capsule study.  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 Given recent drop in hemoglobin and hospitalization she underwent colonoscopy,  endoscopy, and capsule (not resulted) with Dr. Jinny which showed multiple angiodysplastic lesions, treated with APC, 3 small polyps removed, non-bleeding internal hemorrhoids, esophagitis. Pathology of esophagus was consistent with reflux. Descending colon polyps were tubular adenomas without high grade dysplasia. Sigmoid colon polyp was hyperplastic.   # Thrombocytopenia - plt has been > 100. Etiology- secondary to cirrhosis & hypersplenism.  Platelet count has again dropped to 73.  Continue to suspect secondary to cirrhosis.  No active bleeding given that her hemoglobin has improved.  Monitor for now.   # Liver cirrhosis, splenomegaly, small volume ascites.  Patient does not drink alcohol. NAFLD. Followed by GI. 10/2023 US  - cirrhosis, retroperitoneal LNs. January 2025 MRI Abdomen w/wo contrast- chronic liver disease with micronodular liver with fibrosis, ascites, left sided varices. No aggressive appearing masses. Prominent upper abdominal retroperitoneal nodes similar to 2024 MRI. AFP 12/29/22 was 2.5/stable. Defer to GI for HCC screening. MR Abdomen 06/08/24 showed advanced hepatic cirrhosis with diffuse irregularity and contour of liver. No suspicious lesions identified.   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 More complex lesion along mid right kidney is stable. She  had MR Abdomen with GI which noted interval ablation of previously demonstrated mildly complex cystic lesion in right kidney. No suspicious enhancement noted. No other suspicious renal lesions identified. No hydronephrosis.     # URI-immunocompromised.  Duration of symptoms warrants antibiotic coverage.  Start azithromycin .  # Weight loss sec to oezmpic [40 pounds]- stable. Low concerns for malignancy at this time.    DISPOSITION: Venofer  today. Hold transfusion tomorrow 1 week (prefers Friday)- lab (H&H, hold tube) & venofer . D2 poss transfusion 2  weeks (prefers Friday)- lab (H&H, hold tube), venofer  D2 poss transfusion 3 weeks - (prefers Friday)- lab (H&H, hold tube), venofer  D2 poss transfusion 4 weeks- lab (cbc, ferritin, iron  studies, hold tube), Dr Rennie, + venofer , D2 poss transfusion- la  No problem-specific Assessment & Plan notes found for this encounter.  All questions were answered. The patient knows to call the clinic with any problems, questions or concerns.  Tinnie KANDICE Dawn, NP 07/13/2024

## 2024-07-14 ENCOUNTER — Telehealth: Payer: Self-pay | Admitting: *Deleted

## 2024-07-14 ENCOUNTER — Inpatient Hospital Stay

## 2024-07-14 MED ORDER — AZITHROMYCIN 250 MG PO TABS
ORAL_TABLET | ORAL | 0 refills | Status: DC
Start: 2024-07-14 — End: 2024-08-28

## 2024-07-14 NOTE — Telephone Encounter (Signed)
 Called Amy to let her know that Lauren did not give her any prednisone so what ever prednisone she has from the other doctor she can take take it till its done.  Antibiotics that the doctor gave her already Jerilynn says she needs to stop that when do not use it anymore and she has need to take ZITHROMAX  that has been instructions to take 2 pills the first day after that just take 1 pill a day until it is done.  I called Amy's cell phone that she said would be the best set of [phone] I got her voicemail and I left this information on her voicemail

## 2024-07-20 ENCOUNTER — Inpatient Hospital Stay

## 2024-07-20 ENCOUNTER — Other Ambulatory Visit

## 2024-07-20 ENCOUNTER — Encounter: Payer: Self-pay | Admitting: Internal Medicine

## 2024-07-20 VITALS — BP 131/47 | HR 65 | Temp 98.4°F | Resp 18

## 2024-07-20 DIAGNOSIS — D500 Iron deficiency anemia secondary to blood loss (chronic): Secondary | ICD-10-CM | POA: Diagnosis not present

## 2024-07-20 DIAGNOSIS — D649 Anemia, unspecified: Secondary | ICD-10-CM

## 2024-07-20 LAB — SAMPLE TO BLOOD BANK

## 2024-07-20 LAB — HEMOGLOBIN AND HEMATOCRIT (CANCER CENTER ONLY)
HCT: 32.1 % — ABNORMAL LOW (ref 36.0–46.0)
Hemoglobin: 10 g/dL — ABNORMAL LOW (ref 12.0–15.0)

## 2024-07-20 MED ORDER — IRON SUCROSE 20 MG/ML IV SOLN
200.0000 mg | Freq: Once | INTRAVENOUS | Status: AC
  Administered 2024-07-20: 200 mg via INTRAVENOUS
  Filled 2024-07-20: qty 10

## 2024-07-20 NOTE — Patient Instructions (Signed)

## 2024-07-21 ENCOUNTER — Inpatient Hospital Stay

## 2024-07-24 ENCOUNTER — Inpatient Hospital Stay

## 2024-07-28 ENCOUNTER — Inpatient Hospital Stay

## 2024-07-31 ENCOUNTER — Inpatient Hospital Stay

## 2024-07-31 ENCOUNTER — Other Ambulatory Visit: Payer: Self-pay | Admitting: *Deleted

## 2024-07-31 VITALS — BP 146/52 | HR 61 | Resp 16

## 2024-07-31 DIAGNOSIS — D649 Anemia, unspecified: Secondary | ICD-10-CM

## 2024-07-31 DIAGNOSIS — D500 Iron deficiency anemia secondary to blood loss (chronic): Secondary | ICD-10-CM

## 2024-07-31 LAB — SAMPLE TO BLOOD BANK

## 2024-07-31 LAB — HEMOGLOBIN AND HEMATOCRIT (CANCER CENTER ONLY)
HCT: 33.4 % — ABNORMAL LOW (ref 36.0–46.0)
Hemoglobin: 10.7 g/dL — ABNORMAL LOW (ref 12.0–15.0)

## 2024-07-31 MED ORDER — ONDANSETRON HCL 8 MG PO TABS: 8.0000 mg | ORAL_TABLET | Freq: Three times a day (TID) | ORAL | 0 refills | Status: AC | PRN

## 2024-07-31 MED ORDER — IRON SUCROSE 20 MG/ML IV SOLN
200.0000 mg | Freq: Once | INTRAVENOUS | Status: AC
  Administered 2024-07-31: 200 mg via INTRAVENOUS
  Filled 2024-07-31: qty 10

## 2024-08-01 ENCOUNTER — Inpatient Hospital Stay

## 2024-08-02 ENCOUNTER — Encounter: Payer: Self-pay | Admitting: Internal Medicine

## 2024-08-04 ENCOUNTER — Inpatient Hospital Stay: Attending: Internal Medicine

## 2024-08-04 ENCOUNTER — Inpatient Hospital Stay

## 2024-08-04 VITALS — BP 131/43 | HR 55 | Temp 97.1°F | Resp 18

## 2024-08-04 DIAGNOSIS — D500 Iron deficiency anemia secondary to blood loss (chronic): Secondary | ICD-10-CM | POA: Diagnosis present

## 2024-08-04 DIAGNOSIS — Z79899 Other long term (current) drug therapy: Secondary | ICD-10-CM | POA: Diagnosis not present

## 2024-08-04 DIAGNOSIS — K922 Gastrointestinal hemorrhage, unspecified: Secondary | ICD-10-CM | POA: Insufficient documentation

## 2024-08-04 DIAGNOSIS — D649 Anemia, unspecified: Secondary | ICD-10-CM

## 2024-08-04 LAB — SAMPLE TO BLOOD BANK

## 2024-08-04 LAB — HEMOGLOBIN AND HEMATOCRIT (CANCER CENTER ONLY)
HCT: 33.4 % — ABNORMAL LOW (ref 36.0–46.0)
Hemoglobin: 10.9 g/dL — ABNORMAL LOW (ref 12.0–15.0)

## 2024-08-04 MED ORDER — IRON SUCROSE 20 MG/ML IV SOLN
200.0000 mg | Freq: Once | INTRAVENOUS | Status: AC
  Administered 2024-08-04: 200 mg via INTRAVENOUS
  Filled 2024-08-04: qty 10

## 2024-08-04 NOTE — Patient Instructions (Signed)

## 2024-08-07 ENCOUNTER — Inpatient Hospital Stay

## 2024-08-07 ENCOUNTER — Telehealth: Payer: Self-pay

## 2024-08-07 DIAGNOSIS — F3176 Bipolar disorder, in full remission, most recent episode depressed: Secondary | ICD-10-CM

## 2024-08-07 MED ORDER — ARIPIPRAZOLE 10 MG PO TABS
10.0000 mg | ORAL_TABLET | Freq: Every day | ORAL | 3 refills | Status: AC
Start: 2024-08-07 — End: ?

## 2024-08-07 NOTE — Telephone Encounter (Signed)
I have sent Abilify to pharmacy. 

## 2024-08-07 NOTE — Addendum Note (Signed)
 Addended by: Rendi Mapel on: 08/07/2024 10:14 AM   Modules accepted: Orders

## 2024-08-07 NOTE — Telephone Encounter (Signed)
 Received fax from patients pharmacy requesting a refill of ARIPiprazole  (ABILIFY ) 10 MG tablet    Last visit 06-19-24 Next visit 09-21-24     Preferred Pharmacies   [facility] [address] - [place], [place] - [address] AT [address] Phone: [phone]  Fax: [phone]

## 2024-08-10 ENCOUNTER — Ambulatory Visit: Admitting: Cardiology

## 2024-08-11 ENCOUNTER — Encounter: Payer: Self-pay | Admitting: Internal Medicine

## 2024-08-11 ENCOUNTER — Inpatient Hospital Stay: Admitting: Nurse Practitioner

## 2024-08-11 ENCOUNTER — Inpatient Hospital Stay

## 2024-08-11 ENCOUNTER — Telehealth: Payer: Self-pay | Admitting: Internal Medicine

## 2024-08-11 NOTE — Telephone Encounter (Signed)
 She just woke up and can't be here for appointment today. She is saying she had labs at Dr. Molli office and her levels were up. She can not come at all until Wednesday or Thursday after 12 next week. Please advise.

## 2024-08-11 NOTE — Telephone Encounter (Signed)
 Hgb 11.8 at [facility] clinic and stable. Ok to change apts out to next week.

## 2024-08-14 ENCOUNTER — Other Ambulatory Visit: Payer: Self-pay | Admitting: Interventional Radiology

## 2024-08-14 ENCOUNTER — Inpatient Hospital Stay

## 2024-08-14 ENCOUNTER — Ambulatory Visit: Admitting: Student in an Organized Health Care Education/Training Program

## 2024-08-14 DIAGNOSIS — N2889 Other specified disorders of kidney and ureter: Secondary | ICD-10-CM

## 2024-08-15 ENCOUNTER — Ambulatory Visit
Admission: RE | Admit: 2024-08-15 | Discharge: 2024-08-15 | Disposition: A | Discharge status: Home / Self Care | Source: Ambulatory Visit | Attending: Interventional Radiology | Admitting: Interventional Radiology

## 2024-08-15 DIAGNOSIS — N2889 Other specified disorders of kidney and ureter: Secondary | ICD-10-CM

## 2024-08-15 HISTORY — PX: IR RADIOLOGIST EVAL & MGMT: IMG5224

## 2024-08-15 NOTE — Progress Notes (Signed)
 Referring Physician(s): Stoioff,Scott C   Supervising Physician: Karalee Beat  Chief Complaint: The patient is seen in follow up today s/p right renal mass thermal ablation 11/22/23  History of present illness:  Margaret [name], 68 year old female, has a medical history significant for heart failure, aortic stenosis, CAD, Bipolar 1 disorder, DM, liver cirrhosis with portal hypertension, and a right renal mass. The right renal mass was followed with surveillance imaging and continued to enlarge. She was referred to Interventional Radiology to explore treatment options and she met with Dr. Karalee in consultation 10/28/23. They discussed conservative management with surveillance imaging versus treatment with percutaneous thermal ablation. Given that the lesion had demonstrated growth the patient expressed a desire to proceed with treatment.   On 11/22/23 she underwent a technically successful percutaneous thermal ablation of the right renal mass. She tolerated the procedure well and when she followed up with Dr. Karalee 01/18/24 she reported an uneventful recovery. They discussed following up with routine surveillance imaging and this was performed 06/08/24.   She presents to the IR outpatient clinic today for follow up and to review the results of her MR Abdomen. She is feeling well today and has no issues or concerns.   Past Medical History:  Diagnosis Date   (HFpEF) heart failure with preserved ejection fraction (HCC)    a.) TTE 12/15/2022: EF >55%, mild LVH, G1DD, mild LAE, mild MAC, triv AR/MR/TR, mild TR, RVSP 30.7, mod AS (MPG 20)   Anxiety    Aortic atherosclerosis    Aortic stenosis    a.) TTE 12/15/2022: moderate AS (MPG 20 mmHg; AVA = 1.7 cm2)   Bilateral carotid artery disease    a.) doppler 12/22/2020: <50% BICA   Bilateral renal cysts    Bipolar 1 disorder (HCC)    CAD (coronary artery disease) 11/04/2022   a.) CT chest 11/04/2022: 3v CAD   Cardiac murmur    CKD  (chronic kidney disease), stage III (HCC)    DDD (degenerative disc disease), lumbar    Depression    DM (diabetes mellitus), type 2 (HCC)    Former smoker    Frequent falls    GERD (gastroesophageal reflux disease)    H/O: GI bleed    Heart murmur    History of blood transfusion    History of MRSA infection 2017   HLD (hyperlipidemia)    Hypertension    IDA (iron  deficiency anemia)    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H [facility])    Multiple pulmonary nodules    Nephrolithiasis    Occasional tremors    Osteoarthritis    Osteoporosis    Portal hypertensive gastropathy (HCC)    Portal venous hypertension (HCC)    Renal mass, right 03/30/2023   a.) MRI ABD 03/30/2023: 1.2 x 1.2 cm complex cystic renal cortical mass in the posterior mid to low RIGHT kidney (Bosniak 3); b.) MRI ABD 08/03/2023: interval increase in size to 1.4 x 1.3 cm -> concerning for RCC; c.) US  ABD 10/12/2023: 1.7 complex cystic mass RIGHT kidney; d.) MRI ABD 11/15/2023: interval increase in size to 1.6 x 1.2 x 1.0 cm   Splenomegaly    Thoracic spondylosis    Thrombocytopenia    Tubular adenoma of colon    Uses walker    Venous insufficiency    Ventral hernia     Past Surgical History:  Procedure Laterality Date   ABDOMINAL HYSTERECTOMY     CHOLECYSTECTOMY     COLONOSCOPY N/A 02/15/2024   Procedure: COLONOSCOPY;  Surgeon: Jinny Carmine, MD;  Location: [facility] ENDOSCOPY;  Service: Endoscopy;  Laterality: N/A;   COLONOSCOPY  02/15/2024   Procedure: COLONOSCOPY, WITH ARGON PLASMA COAGULATION;  Surgeon: Jinny Carmine, MD;  Location: [facility] ENDOSCOPY;  Service: Endoscopy;;   COLONOSCOPY WITH PROPOFOL  N/A 06/23/2019   Procedure: COLONOSCOPY WITH PROPOFOL ;  Surgeon: Therisa Bi, MD;  Location: [facility] ENDOSCOPY;  Service: Gastroenterology;  Laterality: N/A;   COLONOSCOPY WITH PROPOFOL  N/A 12/08/2022   Procedure: COLONOSCOPY WITH PROPOFOL ;  Surgeon: Maryruth Ole DASEN, MD;  Location: [facility] ENDOSCOPY;  Service: Endoscopy;   Laterality: N/A;   COLONOSCOPY WITH PROPOFOL  N/A 05/18/2023   Procedure: COLONOSCOPY WITH PROPOFOL ;  Surgeon: Maryruth Ole DASEN, MD;  Location: [facility] ENDOSCOPY;  Service: Endoscopy;  Laterality: N/A;   ESOPHAGOGASTRODUODENOSCOPY N/A 02/15/2024   Procedure: EGD (ESOPHAGOGASTRODUODENOSCOPY);  Surgeon: Jinny Carmine, MD;  Location: [facility] ENDOSCOPY;  Service: Endoscopy;  Laterality: N/A;   ESOPHAGOGASTRODUODENOSCOPY N/A 07/04/2024   Procedure: EGD (ESOPHAGOGASTRODUODENOSCOPY);  Surgeon: Maryruth Ole DASEN, MD;  Location: [facility] ENDOSCOPY;  Service: Endoscopy;  Laterality: N/A;   ESOPHAGOGASTRODUODENOSCOPY (EGD) WITH PROPOFOL  N/A 06/23/2019   Procedure: ESOPHAGOGASTRODUODENOSCOPY (EGD) WITH PROPOFOL ;  Surgeon: Therisa Bi, MD;  Location: [facility] ENDOSCOPY;  Service: Gastroenterology;  Laterality: N/A;   ESOPHAGOGASTRODUODENOSCOPY (EGD) WITH PROPOFOL  N/A 12/08/2022   Procedure: ESOPHAGOGASTRODUODENOSCOPY (EGD) WITH PROPOFOL ;  Surgeon: Maryruth Ole DASEN, MD;  Location: [facility] ENDOSCOPY;  Service: Endoscopy;  Laterality: N/A;  DM, OZEMPIC   ESOPHAGOGASTRODUODENOSCOPY (EGD) WITH PROPOFOL  N/A 01/12/2023   Procedure: ESOPHAGOGASTRODUODENOSCOPY (EGD) WITH PROPOFOL ;  Surgeon: Maryruth Ole DASEN, MD;  Location: [facility] ENDOSCOPY;  Service: Endoscopy;  Laterality: N/A;  DM   GIVENS CAPSULE STUDY  02/15/2024   Procedure: IMAGING PROCEDURE, GI TRACT, INTRALUMINAL, VIA CAPSULE;  Surgeon: Jinny Carmine, MD;  Location: [facility] ENDOSCOPY;  Service: Endoscopy;;   HOT HEMOSTASIS  02/15/2024   Procedure: EGD, WITH ARGON PLASMA COAGULATION;  Surgeon: Jinny Carmine, MD;  Location: [facility] ENDOSCOPY;  Service: Endoscopy;;   IR RADIOLOGIST EVAL & MGMT  10/28/2023   IR RADIOLOGIST EVAL & MGMT  01/18/2024   POLYPECTOMY  05/18/2023   Procedure: POLYPECTOMY;  Surgeon: Maryruth Ole DASEN, MD;  Location: [facility] ENDOSCOPY;  Service: Endoscopy;;   POLYPECTOMY  02/15/2024   Procedure: POLYPECTOMY, INTESTINE;  Surgeon: Jinny Carmine, MD;  Location: [facility]  ENDOSCOPY;  Service: Endoscopy;;   TONSILLECTOMY     TOTAL HIP ARTHROPLASTY Right 2017    Allergies: Lithium, Penicillins, Sulfa antibiotics, and Benzonatate  Medications: Prior to Admission medications   Medication Sig Start Date End Date Taking? Authorizing Provider  amLODipine  (NORVASC ) 5 MG tablet Take 5 mg by mouth daily.    [provider]  ARIPiprazole  (ABILIFY ) 10 MG tablet Take 1 tablet (10 mg total) by mouth daily. 08/07/24   Eappen, Saramma, MD  azithromycin  (ZITHROMAX ) 250 MG tablet Take 2 on day 1; and then 1 pill once a day. 07/14/24   Dasie Tinnie MATSU, NP  escitalopram  (LEXAPRO ) 10 MG tablet TAKE ONE TABLET BY MOUTH ONE TIME DAILY 10/08/23   Eappen, Saramma, MD  furosemide (LASIX) 20 MG tablet Take 20 mg by mouth daily as needed for fluid or edema. 01/30/20   [provider]  glimepiride (AMARYL) 2 MG tablet Take 2 mg by mouth every morning. 02/22/24   [provider]  HYDROcodone -acetaminophen  (NORCO) 10-325 MG tablet Take 1 tablet by mouth every 8 (eight) hours as needed for severe pain (pain score 7-10). Must last 30 days. 07/31/24 08/30/24  Patel, Seema K, NP  lamoTRIgine  (LAMICTAL ) 200 MG tablet TAKE ONE  TABLET BY MOUTH ONE TIME DAILY 10/08/23   Eappen, Saramma, MD  lamoTRIgine  (LAMICTAL ) 25 MG tablet Take 1 tablet (25 mg total) by mouth daily. Take daily with 200 mg , total of 225 mg 07/06/24   Eappen, Saramma, MD  meloxicam (MOBIC) 7.5 MG tablet Take 7.5 mg by mouth. Patient not taking: Reported on 07/13/2024 04/07/24 04/07/25  [provider]  metFORMIN (GLUCOPHAGE) 500 MG tablet Take 500 mg by mouth 2 (two) times daily with a meal.    [provider]  ondansetron  (ZOFRAN ) 8 MG tablet Take 1 tablet (8 mg total) by mouth every 8 (eight) hours as needed for nausea or vomiting. 07/31/24   Brahmanday, Govinda R, MD  OZEMPIC, 1 MG/DOSE, 4 MG/3ML SOPN Inject 1 mg into the skin once a week. Sundays 12/08/21   [provider]  pantoprazole   (PROTONIX ) 40 MG tablet Take 40 mg by mouth every morning.    [provider]  polyethylene glycol powder (GLYCOLAX/MIRALAX) 17 GM/SCOOP powder Take 17 g by mouth daily. 07/15/22   [provider]  spironolactone (ALDACTONE) 25 MG tablet Take 25 mg by mouth daily.    [provider]  tizanidine (ZANAFLEX) 2 MG capsule Take 2 mg by mouth 3 (three) times daily as needed for muscle spasms. Takes differently    [provider]  XIFAXAN  550 MG TABS tablet Take 550 mg by mouth 2 (two) times daily. 10/29/23   [provider]     Family History  Problem Relation Age of Onset   Lung cancer Mother    Heart attack Father 27   Mental illness Father    Bipolar disorder Brother    Bipolar disorder Paternal Uncle    Bipolar disorder Paternal Uncle     Social History   Socioeconomic History   Marital status: Divorced    Spouse name: Not on file   Number of children: 2   Years of education: Not on file   Highest education level: Not on file  Occupational History   Not on file  Tobacco Use   Smoking status: Former    Current packs/day: 0.00    Types: Cigarettes    Quit date: 03/03/2011    Years since quitting: 13.4   Smokeless tobacco: Never  Vaping Use   Vaping status: Never Used  Substance and Sexual Activity   Alcohol use: Yes    Comment: rarely   Drug use: Not Currently   Sexual activity: Not Currently  Other Topics Concern   Not on file  Social History Narrative   Not on file   Social Drivers of Health   Financial Resource Strain: Low Risk  (05/04/2024)   Received from [facility]   Overall Financial Resource Strain (CARDIA)    Difficulty of Paying Living Expenses: Not hard at all  Food Insecurity: No Food Insecurity (05/04/2024)   Received from [facility]   Hunger Vital Sign    Within the past 12 months, you worried that your food would run out before you got the money to buy more.: Never true     Within the past 12 months, the food you bought just didn't last and you didn't have money to get more.: Never true  Transportation Needs: No Transportation Needs (05/04/2024)   Received from [facility] - Transportation    In the past 12 months, has lack of transportation kept you from medical appointments or from getting medications?: No  Lack of Transportation (Non-Medical): No  Physical Activity: Inactive (06/16/2021)   Received from [facility]   Exercise Vital Sign    On average, how many days per week do you engage in moderate to strenuous exercise (like a brisk walk)?: 0 days    On average, how many minutes do you engage in exercise at this level?: 0 min  Stress: No Stress Concern Present (06/16/2021)   Received from [facility] - Occupational Stress Questionnaire  Social Connections: Moderately Integrated (02/14/2024)   Social Connection and Isolation Panel    Frequency of Communication with Friends and Family: More than three times a week    Frequency of Social Gatherings with Friends and Family: More than three times a week    Attends Religious Services: More than 4 times per year    Active Member of [employer] or Organizations: Yes    Attends Engineer, structural: More than 4 times per year    Marital Status: Divorced     Vital Signs: BP (!) 171/61 (BP Location: Left Arm, Patient Position: Sitting, Cuff Size: Normal)   Pulse 72   Temp (!) 96 F (35.6 C) (Oral)   Wt 177 lb (80.3 kg)   SpO2 96%   BMI 34.57 kg/m   Physical Exam Constitutional:      General: She is not in acute distress.    Appearance: She is not ill-appearing.  Cardiovascular:     Rate and Rhythm: Normal rate.  Pulmonary:     Effort: Pulmonary effort is normal.  Skin:    General: Skin is warm and dry.     Findings: Bruising present.     Comments: Scattered bruising to upper extremities.    Neurological:     Mental Status: She is alert and oriented to person, place, and time.     Imaging: No results found.  Labs:  CBC: Recent Labs    04/13/24 1430 04/19/24 1258 06/13/24 1046 06/16/24 0947 07/07/24 1324 07/13/24 1441 07/20/24 1153 07/31/24 1435 08/04/24 1501  WBC 6.6  --  4.0  --  2.9* 2.6*  --   --   --   HGB 6.9*   < > 6.6*   < > 7.6* 8.9* 10.0* 10.7* 10.9*  HCT 22.5*   < > 21.6*   < > 24.7* 28.5* 32.1* 33.4* 33.4*  PLT 104*  --  101*  --  85* 73*  --   --   --    < > = values in this interval not displayed.    COAGS: Recent Labs    11/22/23 0800 02/14/24 1153 05/19/24 1243  INR 1.2 1.3* 1.2    BMP: Recent Labs    02/14/24 0955 02/14/24 1153 02/15/24 1107 04/13/24 1430  NA 137 139 138 133*  K 4.6 4.2 4.6 4.5  CL 104 106 110 100  CO2 22 23 23  21*  GLUCOSE 200* 171* 161* 321*  BUN 18 19 15  32*  CALCIUM 8.9 9.0 8.9 8.6*  CREATININE 1.03* 1.06* 0.95 1.32*  GFRNONAA 60* 58* >60 45*    LIVER FUNCTION TESTS: Recent Labs    10/11/23 0926 01/05/24 1038 02/14/24 1153 02/15/24 1107  BILITOT 1.0 0.9 1.0 2.2*  AST 25 29 30  45*  ALT 20 20 19 24   ALKPHOS 70 101 84 83  PROT 6.7 6.8 6.5 6.3*  ALBUMIN  3.8 3.7 3.5 3.4*    Assessment and Plan:  68 year old female with  a history of right renal mass s/p percutaneous thermal ablation 11/22/23.   She continues to do well and has no complaints at this time. Her MR abdomen 06/08/24 shows interval ablation of the right renal mass without evidence of local recurrence or metastatic disease.   Plan: Surveillance MR Abdomen with and without contrast in 6 months.   Electronically Signed: Warren JONELLE Dais 08/15/2024, 1:13 PM   I spent a total of 25 Minutes in face to face in clinical consultation, greater than 50% of which was counseling/coordinating care for right renal mass.

## 2024-08-15 NOTE — Progress Notes (Signed)
 Patient Profile:   Margaret [name]  is a 68 y.o.  female Chief Complaint  Patient presents with  . Follow-up      PROBLEM LIST: Past Medical History:  Diagnosis Date  . Allergy 20 yrs. Old  . Anxiety disorder 11/14/2019  . Bipolar disorder, in partial remission, most recent episode mixed (CMS/HHS-HCC) 06/21/2019  . CKD (chronic kidney disease), stage III (CMS-HCC) 06/21/2019  . Controlled type 2 diabetes mellitus without complication, with long-term current use of insulin  (CMS/HHS-HCC) 06/21/2019  . Gastroesophageal reflux disease without esophagitis 05/06/2020  . Generalized osteoarthritis 06/21/2019  . Heart murmur   . History of blood transfusion   . Hyperlipidemia 05/06/2020  . Hypertension    Penicillin, Sulfa drugs  . Iron  deficiency anemia due to chronic blood loss 07/04/2019   EGD normal, colonoscopy multiple polyps, 8/20, hemoglobin 5.7, transfusion x3  . Osteoporosis ?  SABRA Recurrent major depressive episodes, moderate (CMS-HCC) 05/06/2020  . Sleep apnea ?    Past Surgical History:  Procedure Laterality Date  . Colon @ [facility]  12/08/2022   Tubular adenoma/PHx CP/Repeat colonoscopy in 6 months due to prep/CTL  . EGD @ [facility]  12/08/2022   Normal EGD biopsy/Repeat TBD/CTL  . EGD @ [facility]  01/12/2023   EGD with GAVE and AVM's, treated with apc. Repeat prn/CTL  . Colon @ [facility]  05/18/2023   Tubular adenoma/Hyperplastic changes/PHx CP/Colonoscopy with TA. Given total of three Ta's across two colonoscopies, would repeat colonoscopy in three years/CTL  . EGD@[facility]   07/04/2024   PortalHypertensiveGastropathy/RptPRN/CTL  . CHOLECYSTECTOMY  ?  . HYSTERECTOMY  In my 30's  . JOINT REPLACEMENT    . TONSILLECTOMY  68 yrs old- 1960's    ALLERGIES: Allergies  Allergen Reactions  . Lithium Nausea And Vomiting and Vomiting  . Penicillin Hives  . Penicillins Hives  . Sulfa (Sulfonamide Antibiotics) Hives  . Tessalon [Benzonatate] Dizziness    CURRENT  MEDICATIONS: Current Outpatient Medications  Medication Sig Dispense Refill  . amLODIPine  (NORVASC ) 2.5 MG tablet Take 1 tablet (2.5 mg total) by mouth once daily 45 tablet 3  . ARIPiprazole  (ABILIFY ) 5 MG tablet Take 5 mg by mouth once daily Per patient 10 mg daily    . benztropine  (COGENTIN ) 0.5 MG tablet Take by mouth    . cefdinir (OMNICEF) 300 mg capsule Take 1 capsule (300 mg total) by mouth 2 (two) times daily for 7 days 14 capsule 0  . cholecalciferol 1000 unit tablet Take 1 tablet (1,000 Units total) by mouth once daily    . duke's magic mouthwash Swish and spit 5 mLs every 6 (six) hours as needed 120 mL 0  . escitalopram  oxalate (LEXAPRO ) 10 MG tablet Take 10 mg by mouth once daily 15 mg daily per patient    . fluticasone  propionate (CUTIVATE ) 0.05 % cream Apply 1 Application topically 2 (two) times daily    . fluticasone  propionate (CUTIVATE ) 0.05 % cream Apply topically 2 (two) times daily for 30 days 30 g 2  . FUROsemide (LASIX) 20 MG tablet TAKE 1-2 TABLETS BY MOUTH ONCE DAILY FOR FLUID. 180 tablet 3  . glimepiride (AMARYL) 2 MG tablet Take 1 tablet (2 mg total) by mouth daily with breakfast 180 tablet 3  . HYDROcodone -acetaminophen  (NORCO) 5-325 mg tablet Takes 3 times a day.    . hydrOXYzine  pamoate (VISTARIL ) 25 MG  capsule Take 25 mg by mouth    . ketoconazole (NIZORAL) 2 % cream Apply 1 Application topically once daily 30 g 2  . lamoTRIgine  (LAMICTAL ) 200 MG tablet Take 200 mg by mouth once daily    . lamoTRIgine  (LAMICTAL ) 25 MG tablet Take 25 mg by mouth every 12 (twelve) hours    . metFORMIN (GLUCOPHAGE) 500 MG tablet Take 1 tablet (500 mg total) by mouth 2 (two) times daily with meals 180 tablet 3  . ondansetron  (ZOFRAN ) 8 MG tablet Take 8 mg by mouth every 8 (eight) hours as needed for Nausea or Vomiting    . pantoprazole  (PROTONIX ) 40 MG DR tablet Take 1 tablet (40 mg total) by mouth 2 (two) times daily before meals 60 tablet 11  . polyethylene glycol (MIRALAX) powder  MIX 17 GRAMS IN 8 OZ OF WATER /JUICE AND CONSUME 5 TIMES DAILY 510 g 6  . rifAXIMin  (XIFAXAN ) 550 mg tablet Take 1 tablet (550 mg total) by mouth 2 (two) times daily 180 tablet 3  . semaglutide (OZEMPIC) 1 mg/dose (4 mg/3 mL) pen injector INJECT 1MG  UNDER THE SKIN ONCE WEEKLY ON THE SAME DAY EACH WEEK 3 mL 5  . spironolactone (ALDACTONE) 25 MG tablet Take 2 tablets (50 mg total) by mouth once daily 180 tablet 3  . tiZANidine (ZANAFLEX) 2 MG capsule TAKE ONE CAPSULE BY MOUTH THREE TIMES A DAY AS NEEDED FOR MUSCLE SPASMS 50 capsule 3  . blood glucose diagnostic test strip 1 each (1 strip total) 3 (three) times daily Use as instructed. Accu check 100 each 12  . blood glucose diagnostic test strip 1 each (1 strip total) as directed Use as instructed. Use twice a day  Dx E11.9 Accu Chek 200 each 3  . blood glucose meter kit as directed 1 each 0  . lancets Use 1 each as directed Use twice a day DX E11.9 Accu Chek 200 each 3   No current facility-administered medications for this visit.      HPI   CLINICAL SUMMARY:  Patient presents post-COVID with severe bronchitis, has been on Z-Pak, and Omnicef.  Chest x-ray did not show pneumonia.  Her coughing is dramatically better.  Hemoglobin A1c up to 9.6, has not been following her diet at all  ROS: Review of systems is unremarkable for any active cardiac, respiratory, GI, GU, hematologic, neurologic, dermatologic, HEENT, or psychiatric symptoms except as noted above, 10 systems reviewed.  No fevers, chills, or constitutional symptoms.   PHYSICAL EXAM  Vital signs:  BP (!) 140/70   Pulse 71   Wt 78.7 kg (173 lb 6.4 oz)   LMP  (LMP Unknown)   SpO2 97%   BMI 32.23 kg/m  Body mass index is 32.23 kg/m.   Wt Readings from Last 3 Encounters:  08/15/24 78.7 kg (173 lb 6.4 oz)  08/08/24 80.6 kg (177 lb 9.6 oz)  07/21/24 80.4 kg (177 lb 3.2 oz)     BP Readings from Last 3 Encounters:  08/15/24 (!) 140/70  08/08/24 120/60  07/21/24 120/60     Constitutional:NAD Neck: supple, no thyromegaly, good ROM Respiratory:clear to auscultation, no rales or wheezes Cardiovascular:RRR, no murmur or gallop Abdominal:soft, good BS, NT Ext: no edema, good peripheral pulses Neuro: alert and oriented X 3, grossly nonfocal     ASSESSMENT/PLAN   Uncontrolled diabetes-secondary to dietary noncompliance, historically if she follows her diet her A1c's are in the 6-7 range.  Back on Ozempic, on metformin, she will return to dietary control  46-month reevaluation this is due to dietary indiscretion Post-COVID bronchitis-post antibiotics, lungs are clear, still some cough, a few days of Tussionex Flu shot  Dispo:   Return in about 4 months (around 12/16/2024) for followup.

## 2024-08-16 NOTE — Progress Notes (Unsigned)
 [facility] CONSULT NOTE  Patient Care Team: Cleotilde Oneil FALCON, MD as PCP - General (Internal Medicine) Rennie Cindy SAUNDERS, MD as Consulting Physician (Internal Medicine) Maryruth Ole DASEN, MD as Consulting Physician (Gastroenterology)  CHIEF COMPLAINTS/PURPOSE OF CONSULTATION: Anemia/thrombocytopenia  HEMATOLOGY HISTORY  06/21/2019-06/24/2019 due to severe anemia; hemoglobin of 5.7 upon admission; patient received 2 units of blood transfusion, 3 doses of IV iron . EGD and colonoscopy were performed during the hospitalization EGD and colonoscopy on 06/23/2019. [Dr.Anna; [facility]] EGD showed no abnormalities and the colonoscopy showed 3 sessile diminutive polyps which were resected completely.  Arteries of the duodenum were normal.  3 colon polyps resected were serrated polyp x2 and a tubular adenoma x1. No active source of bleeding was noted.    # Cirrhosis NAFLD- [? [facility]]  She saw Dr Rennie on 02/14/24 and was found to have hemoglobin 5.1 with significant symptoms. She went to ER and was admitted. On 02/15/24, she underwent EGD with Dr Jinny which revealed LA  Grade A esophagitis w/o bleeding. Biopsied. 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 She also underwent colonoscopy which revealed single nonbleeding colonic angiodysplastic lesion treated with APC. 2 small polyps were removed. She also underwent capsule study. She received several blood transfusions. She was discharged home on 02/16/24.   HISTORY OF PRESENTING ILLNESS: ambulating with rolling walker [arthritis].   Margaret [name] 68 y.o. female pleasant patient was been with a history of cirrhosis/nonalcoholic fatty liver - iron  deficiency secondary to GI bleed/ GAVE [Dr.Locklear], bipolar disorder is here for follow-up of anemia/thrombocytopenia. Today her Hg 12.2 reports mild intermittent bright red blood from rectum after bowel movements nothing  ongoing at this time or significant.   She last received IV iron  on 08/04/2024.  She reports recently recovering from COVID no longer having fever/chills, no nasal congestion, no sore throat, no shortness of breath, mild non productive cough improving.   She reports a long history of bipolar disorder.  She reports  I feel great today but I am feeling manic, and it makes me spend money.  Reports difficulty sleeping last night due to these symptoms.  Denies any symptoms of depression.  She is established with psychiatry and she did agree today to call her psychiatrist and work with her to get through her current manic episode.   Review of Systems  Constitutional:  Negative for chills, diaphoresis, fever, malaise/fatigue and weight loss.  HENT:  Negative for congestion, sinus pain and sore throat.   Respiratory:  Positive for cough. Negative for hemoptysis, sputum production, shortness of breath and wheezing.   Cardiovascular:  Negative for chest pain, palpitations, orthopnea and leg swelling.  Gastrointestinal:  Positive for melena. Negative for abdominal pain, blood in stool, constipation, diarrhea, heartburn, nausea and vomiting.  Genitourinary:  Negative for dysuria, hematuria and urgency.  Musculoskeletal:  Negative for back pain, falls and joint pain.  Skin:  Negative for itching and rash.  Neurological:  Negative for dizziness, tingling, focal weakness, weakness and headaches.  Endo/Heme/Allergies:  Does not bruise/bleed easily.  Psychiatric/Behavioral:  Negative for depression, hallucinations, substance abuse and suicidal ideas. The patient has insomnia. The patient is not nervous/anxious.    MEDICAL HISTORY:  Past Medical History:  Diagnosis Date   (HFpEF) heart failure with preserved ejection fraction (HCC)    a.) TTE 12/15/2022: EF >55%, mild LVH, G1DD, mild LAE, mild MAC, triv AR/MR/TR, mild TR, RVSP 30.7, mod AS (MPG 20)   Anxiety  Aortic atherosclerosis    Aortic stenosis     a.) TTE 12/15/2022: moderate AS (MPG 20 mmHg; AVA = 1.7 cm2)   Bilateral carotid artery disease    a.) doppler 12/22/2020: <50% BICA   Bilateral renal cysts    Bipolar 1 disorder (HCC)    CAD (coronary artery disease) 11/04/2022   a.) CT chest 11/04/2022: 3v CAD   Cardiac murmur    CKD (chronic kidney disease), stage III (HCC)    DDD (degenerative disc disease), lumbar    Depression    DM (diabetes mellitus), type 2 (HCC)    Former smoker    Frequent falls    GERD (gastroesophageal reflux disease)    H/O: GI bleed    Heart murmur    History of blood transfusion    History of MRSA infection 2017   HLD (hyperlipidemia)    Hypertension    IDA (iron  deficiency anemia)    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H [facility])    Multiple pulmonary nodules    Nephrolithiasis    Occasional tremors    Osteoarthritis    Osteoporosis    Portal hypertensive gastropathy (HCC)    Portal venous hypertension (HCC)    Renal mass, right 03/30/2023   a.) MRI ABD 03/30/2023: 1.2 x 1.2 cm complex cystic renal cortical mass in the posterior mid to low RIGHT kidney (Bosniak 3); b.) MRI ABD 08/03/2023: interval increase in size to 1.4 x 1.3 cm -> concerning for RCC; c.) US  ABD 10/12/2023: 1.7 complex cystic mass RIGHT kidney; d.) MRI ABD 11/15/2023: interval increase in size to 1.6 x 1.2 x 1.0 cm   Splenomegaly    Thoracic spondylosis    Thrombocytopenia    Tubular adenoma of colon    Uses walker    Venous insufficiency    Ventral hernia    SURGICAL HISTORY: Past Surgical History:  Procedure Laterality Date   ABDOMINAL HYSTERECTOMY     CHOLECYSTECTOMY     COLONOSCOPY N/A 02/15/2024   Procedure: COLONOSCOPY;  Surgeon: Jinny Carmine, MD;  Location: [facility] ENDOSCOPY;  Service: Endoscopy;  Laterality: N/A;   COLONOSCOPY  02/15/2024   Procedure: COLONOSCOPY, WITH ARGON PLASMA COAGULATION;  Surgeon: Jinny Carmine, MD;  Location: [facility] ENDOSCOPY;  Service: Endoscopy;;   COLONOSCOPY WITH PROPOFOL  N/A 06/23/2019    Procedure: COLONOSCOPY WITH PROPOFOL ;  Surgeon: Therisa Bi, MD;  Location: [facility] ENDOSCOPY;  Service: Gastroenterology;  Laterality: N/A;   COLONOSCOPY WITH PROPOFOL  N/A 12/08/2022   Procedure: COLONOSCOPY WITH PROPOFOL ;  Surgeon: Maryruth Ole DASEN, MD;  Location: [facility] ENDOSCOPY;  Service: Endoscopy;  Laterality: N/A;   COLONOSCOPY WITH PROPOFOL  N/A 05/18/2023   Procedure: COLONOSCOPY WITH PROPOFOL ;  Surgeon: Maryruth Ole DASEN, MD;  Location: [facility] ENDOSCOPY;  Service: Endoscopy;  Laterality: N/A;   ESOPHAGOGASTRODUODENOSCOPY N/A 02/15/2024   Procedure: EGD (ESOPHAGOGASTRODUODENOSCOPY);  Surgeon: Jinny Carmine, MD;  Location: [facility] ENDOSCOPY;  Service: Endoscopy;  Laterality: N/A;   ESOPHAGOGASTRODUODENOSCOPY N/A 07/04/2024   Procedure: EGD (ESOPHAGOGASTRODUODENOSCOPY);  Surgeon: Maryruth Ole DASEN, MD;  Location: [facility] ENDOSCOPY;  Service: Endoscopy;  Laterality: N/A;   ESOPHAGOGASTRODUODENOSCOPY (EGD) WITH PROPOFOL  N/A 06/23/2019   Procedure: ESOPHAGOGASTRODUODENOSCOPY (EGD) WITH PROPOFOL ;  Surgeon: Therisa Bi, MD;  Location: [facility] ENDOSCOPY;  Service: Gastroenterology;  Laterality: N/A;   ESOPHAGOGASTRODUODENOSCOPY (EGD) WITH PROPOFOL  N/A 12/08/2022   Procedure: ESOPHAGOGASTRODUODENOSCOPY (EGD) WITH PROPOFOL ;  Surgeon: Maryruth Ole DASEN, MD;  Location: [facility] ENDOSCOPY;  Service: Endoscopy;  Laterality: N/A;  DM, OZEMPIC   ESOPHAGOGASTRODUODENOSCOPY (EGD) WITH PROPOFOL  N/A 01/12/2023   Procedure: ESOPHAGOGASTRODUODENOSCOPY (EGD) WITH  PROPOFOL ;  Surgeon: Maryruth Ole DASEN, MD;  Location: [facility] ENDOSCOPY;  Service: Endoscopy;  Laterality: N/A;  DM   GIVENS CAPSULE STUDY  02/15/2024   Procedure: IMAGING PROCEDURE, GI TRACT, INTRALUMINAL, VIA CAPSULE;  Surgeon: Jinny Carmine, MD;  Location: [facility] ENDOSCOPY;  Service: Endoscopy;;   HOT HEMOSTASIS  02/15/2024   Procedure: EGD, WITH ARGON PLASMA COAGULATION;  Surgeon: Jinny Carmine, MD;  Location: [facility] ENDOSCOPY;  Service: Endoscopy;;   IR RADIOLOGIST EVAL  & MGMT  10/28/2023   IR RADIOLOGIST EVAL & MGMT  01/18/2024   POLYPECTOMY  05/18/2023   Procedure: POLYPECTOMY;  Surgeon: Maryruth Ole DASEN, MD;  Location: [facility] ENDOSCOPY;  Service: Endoscopy;;   POLYPECTOMY  02/15/2024   Procedure: POLYPECTOMY, INTESTINE;  Surgeon: Jinny Carmine, MD;  Location: [facility] ENDOSCOPY;  Service: Endoscopy;;   TONSILLECTOMY     TOTAL HIP ARTHROPLASTY Right 2017   SOCIAL HISTORY: Social History   Socioeconomic History   Marital status: Divorced    Spouse name: Not on file   Number of children: 2   Years of education: Not on file   Highest education level: Not on file  Occupational History   Not on file  Tobacco Use   Smoking status: Former    Current packs/day: 0.00    Types: Cigarettes    Quit date: 03/03/2011    Years since quitting: 13.4   Smokeless tobacco: Never  Vaping Use   Vaping status: Never Used  Substance and Sexual Activity   Alcohol use: Yes    Comment: rarely   Drug use: Not Currently   Sexual activity: Not Currently  Other Topics Concern   Not on file  Social History Narrative   Not on file   Social Drivers of Health   Financial Resource Strain: Low Risk  (05/04/2024)   Received from [facility]   Overall Financial Resource Strain (CARDIA)    Difficulty of Paying Living Expenses: Not hard at all  Food Insecurity: No Food Insecurity (05/04/2024)   Received from [facility]   Hunger Vital Sign    Within the past 12 months, you worried that your food would run out before you got the money to buy more.: Never true    Within the past 12 months, the food you bought just didn't last and you didn't have money to get more.: Never true  Transportation Needs: No Transportation Needs (05/04/2024)   Received from [facility] - Transportation    In the past 12 months, has lack of transportation kept you from medical appointments or from getting medications?: No    Lack of  Transportation (Non-Medical): No  Physical Activity: Inactive (06/16/2021)   Received from [facility]   Exercise Vital Sign    On average, how many days per week do you engage in moderate to strenuous exercise (like a brisk walk)?: 0 days    On average, how many minutes do you engage in exercise at this level?: 0 min  Stress: No Stress Concern Present (06/16/2021)   Received from [facility] - Occupational Stress Questionnaire  Social Connections: Moderately Integrated (02/14/2024)   Social Connection and Isolation Panel    Frequency of Communication with Friends and Family: More than three times a week    Frequency of Social Gatherings with Friends and Family: More than three times a week    Attends Religious Services: More than 4 times per  year    Active Member of Clubs or Organizations: Yes    Attends Banker Meetings: More than 4 times per year    Marital Status: Divorced  Intimate Partner Violence: Not At Risk (02/14/2024)   Humiliation, Afraid, Rape, and Kick questionnaire    Fear of Current or Ex-Partner: No    Emotionally Abused: No    Physically Abused: No    Sexually Abused: No   FAMILY HISTORY: Family History  Problem Relation Age of Onset   Lung cancer Mother    Heart attack Father 36   Mental illness Father    Bipolar disorder Brother    Bipolar disorder Paternal Uncle    Bipolar disorder Paternal Uncle    ALLERGIES:  is allergic to lithium, penicillins, sulfa antibiotics, and benzonatate.  MEDICATIONS:  Current Outpatient Medications  Medication Sig Dispense Refill   amLODipine  (NORVASC ) 5 MG tablet Take 5 mg by mouth daily.     ARIPiprazole  (ABILIFY ) 10 MG tablet Take 1 tablet (10 mg total) by mouth daily. 90 tablet 3   escitalopram  (LEXAPRO ) 10 MG tablet TAKE ONE TABLET BY MOUTH ONE TIME DAILY 90 tablet 3   furosemide (LASIX) 20 MG tablet Take 20 mg by mouth daily as  needed for fluid or edema.     glimepiride (AMARYL) 2 MG tablet Take 2 mg by mouth every morning.     HYDROcodone -acetaminophen  (NORCO) 10-325 MG tablet Take 1 tablet by mouth every 8 (eight) hours as needed for severe pain (pain score 7-10). Must last 30 days. 90 tablet 0   lamoTRIgine  (LAMICTAL ) 25 MG tablet Take 1 tablet (25 mg total) by mouth daily. Take daily with 200 mg , total of 225 mg 90 tablet 1   metFORMIN (GLUCOPHAGE) 500 MG tablet Take 500 mg by mouth 2 (two) times daily with a meal.     ondansetron  (ZOFRAN ) 8 MG tablet Take 1 tablet (8 mg total) by mouth every 8 (eight) hours as needed for nausea or vomiting. 30 tablet 0   OZEMPIC, 1 MG/DOSE, 4 MG/3ML SOPN Inject 1 mg into the skin once a week. Sundays     pantoprazole  (PROTONIX ) 40 MG tablet Take 40 mg by mouth every morning.     polyethylene glycol powder (GLYCOLAX/MIRALAX) 17 GM/SCOOP powder Take 17 g by mouth daily.     spironolactone (ALDACTONE) 25 MG tablet Take 25 mg by mouth daily.     tizanidine (ZANAFLEX) 2 MG capsule Take 2 mg by mouth 3 (three) times daily as needed for muscle spasms. Takes differently     XIFAXAN  550 MG TABS tablet Take 550 mg by mouth 2 (two) times daily.     azithromycin  (ZITHROMAX ) 250 MG tablet Take 2 on day 1; and then 1 pill once a day. (Patient not taking: Reported on 08/17/2024) 6 each 0   lamoTRIgine  (LAMICTAL ) 200 MG tablet TAKE ONE TABLET BY MOUTH ONE TIME DAILY (Patient not taking: Reported on 08/17/2024) 90 tablet 3   No current facility-administered medications for this visit.   Facility-Administered Medications Ordered in Other Visits  Medication Dose Route Frequency Provider Last Rate Last Admin   iron  sucrose (VENOFER ) injection 200 mg  200 mg Intravenous Once Allen, Lauren G, NP       sodium chloride  flush (NS) 0.9 % injection 10 mL  10 mL Intracatheter Once PRN Brahmanday, Govinda R, MD       sodium chloride  flush (NS) 0.9 % injection 10 mL  10 mL Intracatheter Once PRN  Rennie Cindy SAUNDERS, MD        PHYSICAL EXAMINATION: Vitals:   08/17/24 0839 08/17/24 0910  BP: (!) 161/52 (!) 163/53  Pulse: 69   Resp: 16   Temp: 98.1 F (36.7 C)   SpO2: 96%     Filed Weights   08/17/24 0839  Weight: 173 lb 12.8 oz (78.8 kg)   Physical Exam Vitals reviewed.  Constitutional:      Comments: Fatigued appearing.  Accompanied.  HENT:     Right Ear: Tympanic membrane, ear canal and external ear normal.     Left Ear: Tympanic membrane, ear canal and external ear normal.     Nose: No congestion or rhinorrhea.     Mouth/Throat:     Pharynx: No posterior oropharyngeal erythema.  Eyes:     General: No scleral icterus. Cardiovascular:     Rate and Rhythm: Normal rate and regular rhythm.  Pulmonary:     Effort: No respiratory distress.  Abdominal:     General: There is no distension.     Palpations: Abdomen is soft.     Tenderness: There is no abdominal tenderness. There is no guarding.  Musculoskeletal:     Comments: Walking cast rle  Skin:    General: Skin is warm and dry.  Neurological:     Mental Status: She is alert and oriented to person, place, and time.  Psychiatric:        Mood and Affect: Mood normal.        Behavior: Behavior normal.     LABORATORY DATA:  I have reviewed the data as listed Lab Results  Component Value Date   WBC 4.6 08/17/2024   HGB 12.2 08/17/2024   HCT 36.9 08/17/2024   MCV 88.1 08/17/2024   PLT 85 (L) 08/17/2024   Iron /TIBC/Ferritin/ %Sat    Component Value Date/Time   IRON  25 (L) 07/13/2024 1441   IRON  37 12/06/2019 1507   TIBC 412 07/13/2024 1441   TIBC 428 12/06/2019 1507   FERRITIN 140 07/13/2024 1441   FERRITIN 45 12/06/2019 1507   IRONPCTSAT 6 (L) 07/13/2024 1441   IRONPCTSAT 9 (LL) 12/06/2019 1507   No results found.  ASSESSMENT & PLAN:   Symptomatic anemia # [2020] History of iron  deficiency anemia secondary to chronic GI bleed/cirrhosis; GAVE [EGD in G+FEB 2024].  FEB 2024- Iron  sat 9 Ferritin 45- wnl-PCP;  Hb 9-10.  s/p IV iron  x4 [JAN 2024]- MM panel-WNL.  # April 2025- Hmg 5.1. Ferritin 8, Iron  sat 8%. S/p hospitalization and transfusions. She has been receiving IV iron  nearly weekly and counts ranging from 7-8. Last received IV iron  on 08/04/24  # Today, hemoglobin improved to 12.2.  SEPT Iron  sat 6, Ferritin 140, Hg 12.2. Hold transfusion for tomorrow.  Proceed with Venofer  today due to ongoing intermittent bleeding and continued low iron  saturations.  Iron  studies and ferritin today still pending.  Goal to keep hemoglobin greater than 7.    # Etiology: Cirrhosis/GI bleed FEB 2024- GAVE -non-bleeding-; colo-? Capsule study.  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 Given recent drop in hemoglobin and hospitalization she underwent colonoscopy, endoscopy, and capsule (not resulted) with Dr. Jinny which showed multiple angiodysplastic lesions, treated with APC, 3 small polyps removed, non-bleeding internal hemorrhoids, esophagitis. Pathology of esophagus was consistent with reflux. Descending colon polyps were tubular adenomas without high grade dysplasia. Sigmoid colon polyp was hyperplastic.  She reports mild bright red bleeding from rectum after bowel movement today  nothing significant proceed with venofer  today   # Thrombocytopenia - plt has been > 100. Etiology- secondary to cirrhosis & hypersplenism.  Platelet count at 85 today.  Continue to suspect secondary to cirrhosis.  No active bleeding given that her hemoglobin has improved.  Monitor for now.   # Liver cirrhosis, splenomegaly, small volume ascites.  Patient does not drink alcohol. NAFLD. Followed by GI. 10/2023 US  - cirrhosis, retroperitoneal LNs. January 2025 MRI Abdomen w/wo contrast- chronic liver disease with micronodular liver with fibrosis, ascites, left sided varices. No aggressive appearing masses. Prominent upper abdominal retroperitoneal nodes similar to 2024 MRI. AFP 12/29/22 was 2.5/stable. Defer to GI for HCC  screening. MR Abdomen 06/08/24 showed advanced hepatic cirrhosis with diffuse irregularity and contour of liver. No suspicious lesions identified.   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 More complex lesion along mid right kidney is stable. She had MR Abdomen with GI which noted interval ablation of previously demonstrated mildly complex cystic lesion in right kidney. No suspicious enhancement noted. No other suspicious renal lesions identified. No hydronephrosis.     # URI-immunocompromised.  Duration of symptoms warrants antibiotic coverage.  Start azithromycin .  # Weight loss sec to oezmpic [40 pounds]- stable. Low concerns for malignancy at this time.   Bipolar disorder- reports symptoms of mania today she is established with psychiatry and agreed to call psychiatry today to discuss her symptoms of mania to regain better control.  Currently taking Abilify  and Lexapro .  Discussed with patient the importance of working with psychiatry to manage symptoms to prevent future symptoms of depression she agrees with discussion and plan.   DISPOSITION: Venofer  today. Cancel transfusion tomorrow 2 weeks- lab (H&H, hold tube), venofer . D2 poss transfusion 4 weeks- lab (H&H, hold tube), venofer . D2 poss transfusion 6 weeks- lab (cbc, cmp, ferritin, iron  studies, hold tube), Dr Dessie, +/- venofer . D2 poss transfusion- la  No problem-specific Assessment & Plan notes found for this encounter.  All questions were answered. The patient knows to call the clinic with any problems, questions or concerns.  Tinnie Dawn, DNP, AGNP-C, AOCNP [facility] [phone] (clinic)

## 2024-08-17 ENCOUNTER — Inpatient Hospital Stay

## 2024-08-17 ENCOUNTER — Encounter: Payer: Self-pay | Admitting: Nurse Practitioner

## 2024-08-17 ENCOUNTER — Inpatient Hospital Stay: Admitting: Nurse Practitioner

## 2024-08-17 ENCOUNTER — Telehealth: Payer: Self-pay

## 2024-08-17 VITALS — BP 163/53 | HR 69 | Temp 98.1°F | Resp 16 | Ht 60.0 in | Wt 173.8 lb

## 2024-08-17 VITALS — BP 155/57 | HR 70

## 2024-08-17 DIAGNOSIS — D500 Iron deficiency anemia secondary to blood loss (chronic): Secondary | ICD-10-CM | POA: Diagnosis not present

## 2024-08-17 DIAGNOSIS — D649 Anemia, unspecified: Secondary | ICD-10-CM

## 2024-08-17 LAB — CBC WITH DIFFERENTIAL (CANCER CENTER ONLY)
Abs Immature Granulocytes: 0.02 K/uL (ref 0.00–0.07)
Basophils Absolute: 0 K/uL (ref 0.0–0.1)
Basophils Relative: 0 %
Eosinophils Absolute: 0.1 K/uL (ref 0.0–0.5)
Eosinophils Relative: 1 %
HCT: 36.9 % (ref 36.0–46.0)
Hemoglobin: 12.2 g/dL (ref 12.0–15.0)
Immature Granulocytes: 0 %
Lymphocytes Relative: 15 %
Lymphs Abs: 0.7 K/uL (ref 0.7–4.0)
MCH: 29.1 pg (ref 26.0–34.0)
MCHC: 33.1 g/dL (ref 30.0–36.0)
MCV: 88.1 fL (ref 80.0–100.0)
Monocytes Absolute: 0.5 K/uL (ref 0.1–1.0)
Monocytes Relative: 10 %
Neutro Abs: 3.4 K/uL (ref 1.7–7.7)
Neutrophils Relative %: 74 %
Platelet Count: 85 K/uL — ABNORMAL LOW (ref 150–400)
RBC: 4.19 MIL/uL (ref 3.87–5.11)
RDW: 15.9 % — ABNORMAL HIGH (ref 11.5–15.5)
WBC Count: 4.6 K/uL (ref 4.0–10.5)
nRBC: 0 % (ref 0.0–0.2)

## 2024-08-17 LAB — FERRITIN: Ferritin: 175 ng/mL (ref 11–307)

## 2024-08-17 LAB — SAMPLE TO BLOOD BANK

## 2024-08-17 LAB — IRON AND TIBC
Iron: 69 ug/dL (ref 28–170)
Saturation Ratios: 16 % (ref 10.4–31.8)
TIBC: 437 ug/dL (ref 250–450)
UIBC: 368 ug/dL

## 2024-08-17 MED ORDER — IRON SUCROSE 20 MG/ML IV SOLN
200.0000 mg | Freq: Once | INTRAVENOUS | Status: AC
  Administered 2024-08-17: 200 mg via INTRAVENOUS
  Filled 2024-08-17: qty 10

## 2024-08-17 MED ORDER — SODIUM CHLORIDE 0.9% FLUSH
10.0000 mL | Freq: Once | INTRAVENOUS | Status: AC | PRN
  Administered 2024-08-17: 10 mL
  Filled 2024-08-17: qty 10

## 2024-08-17 NOTE — Telephone Encounter (Signed)
 Medication management - Telephone call with pt to follow up on a message she left requeting the call back. Pt stated she had been feeling more manic the past 4-5 days, not sleeping but 4-6 broken hours a nithg, thoughts racing more, and feeling on top of the world.  Patient is concerned she may need her medications to be tweaked a little.  Patient stated today she was more shaky too so she took medication to help with this but still concerned and wanted provider to know in case something was needed.  Agreed to send message to Dr. Eappen.

## 2024-08-17 NOTE — Progress Notes (Signed)
 07/04/24 endoscopy.Chest xray last week, covid.

## 2024-08-17 NOTE — Telephone Encounter (Signed)
 Please schedule for tomorrow virtual 30 minutes .

## 2024-08-18 ENCOUNTER — Inpatient Hospital Stay

## 2024-08-18 ENCOUNTER — Telehealth (INDEPENDENT_AMBULATORY_CARE_PROVIDER_SITE_OTHER): Admitting: Psychiatry

## 2024-08-18 DIAGNOSIS — F3161 Bipolar disorder, current episode mixed, mild: Secondary | ICD-10-CM

## 2024-08-18 NOTE — Progress Notes (Signed)
 Patient arrived for the appointment however unable to connect due to network issue.  Patient's appointment has been rescheduled.  Patient also will be placed on a wait list for next week for a sooner appointment.

## 2024-08-21 ENCOUNTER — Ambulatory Visit (HOSPITAL_BASED_OUTPATIENT_CLINIC_OR_DEPARTMENT_OTHER): Admitting: Nurse Practitioner

## 2024-08-21 DIAGNOSIS — G894 Chronic pain syndrome: Secondary | ICD-10-CM

## 2024-08-21 DIAGNOSIS — Z91199 Patient's noncompliance with other medical treatment and regimen due to unspecified reason: Secondary | ICD-10-CM

## 2024-08-21 NOTE — Progress Notes (Signed)
 08/21/2024-No show

## 2024-08-24 ENCOUNTER — Encounter: Payer: Self-pay | Admitting: Internal Medicine

## 2024-08-28 ENCOUNTER — Ambulatory Visit: Attending: Nurse Practitioner | Admitting: Nurse Practitioner

## 2024-08-28 ENCOUNTER — Encounter: Payer: Self-pay | Admitting: Nurse Practitioner

## 2024-08-28 VITALS — BP 155/54 | HR 66 | Temp 97.2°F | Resp 18 | Ht 60.0 in | Wt 171.0 lb

## 2024-08-28 DIAGNOSIS — M47816 Spondylosis without myelopathy or radiculopathy, lumbar region: Secondary | ICD-10-CM | POA: Diagnosis not present

## 2024-08-28 DIAGNOSIS — G894 Chronic pain syndrome: Secondary | ICD-10-CM | POA: Insufficient documentation

## 2024-08-28 DIAGNOSIS — M170 Bilateral primary osteoarthritis of knee: Secondary | ICD-10-CM | POA: Diagnosis present

## 2024-08-28 DIAGNOSIS — M25562 Pain in left knee: Secondary | ICD-10-CM | POA: Diagnosis present

## 2024-08-28 DIAGNOSIS — Z79891 Long term (current) use of opiate analgesic: Secondary | ICD-10-CM | POA: Insufficient documentation

## 2024-08-28 DIAGNOSIS — Z96642 Presence of left artificial hip joint: Secondary | ICD-10-CM | POA: Diagnosis present

## 2024-08-28 DIAGNOSIS — M5416 Radiculopathy, lumbar region: Secondary | ICD-10-CM | POA: Diagnosis present

## 2024-08-28 DIAGNOSIS — G8929 Other chronic pain: Secondary | ICD-10-CM | POA: Insufficient documentation

## 2024-08-28 MED ORDER — HYDROCODONE-ACETAMINOPHEN 10-325 MG PO TABS: 1.0000 | ORAL_TABLET | Freq: Three times a day (TID) | ORAL | 0 refills | Status: AC | PRN

## 2024-08-28 MED ORDER — HYDROCODONE-ACETAMINOPHEN 10-325 MG PO TABS: 1.0000 | ORAL_TABLET | Freq: Three times a day (TID) | ORAL | 0 refills | Status: DC | PRN

## 2024-08-28 NOTE — Progress Notes (Signed)
 Nursing Pain Medication Assessment:  Safety precautions to be maintained throughout the outpatient stay will include: orient to surroundings, keep bed in low position, maintain call bell within reach at all times, provide assistance with transfer out of bed and ambulation.  Medication Inspection Compliance: Pill count conducted under aseptic conditions, in front of the patient. Neither the pills nor the bottle was removed from the patient's sight at any time. Once count was completed pills were immediately returned to the patient in their original bottle.  Medication: Hydrocodone /APAP Pill/Patch Count: 0 of 90 pills/patches remain Pill/Patch Appearance: Markings consistent with prescribed medication Bottle Appearance: Standard pharmacy container. Clearly labeled. Filled Date: 09 / 29 / 2025 Last Medication intake:  TodaySafety precautions to be maintained throughout the outpatient stay will include: orient to surroundings, keep bed in low position, maintain call bell within reach at all times, provide assistance with transfer out of bed and ambulation.   Patient reports there are 6 pills in her pill container.

## 2024-08-28 NOTE — Progress Notes (Signed)
 PROVIDER NOTE: Interpretation of information contained herein should be left to medically-trained personnel. Specific patient instructions are provided elsewhere under Patient Instructions section of medical record. This document was created in part using AI and STT-dictation technology, any transcriptional errors that may result from this process are unintentional.  Patient: Margaret [name]  Service: E/M   PCP: Cleotilde Oneil FALCON, MD  DOB: [birth year]  DOS: 08/28/2024  Provider: Emmy MARLA Blanch, NP  MRN: [id number]  Delivery: Face-to-face  Specialty: Interventional Pain Management  Type: Established Patient  Setting: Ambulatory outpatient facility  Specialty designation: 09  Referring Prov.: Cleotilde Oneil FALCON, MD  Location: Outpatient office facility       History of present illness (HPI) [name], a 68 y.o. year old female, is here today because of her Bilateral primary osteoarthritis of knee [M17.0]. [name] primary complain today is bilateral knee pain.   Pertinent problems: [name] has Chronic pain syndrome; Encounter for long-term opioid analgesics use; Bilateral primary osteoarthritis of knee; SI (sacroiliac) joint dysfunction; History of left hip replacement; Recurrent major depressive episodes, moderate (HCC); Cervical radicular pain; Lumbar facet arthropathy; Chronic right shoulder pain; and Cervical facet joint syndrome on the pertinent problem list.  Pain Assessment: Severity of 8/10  is reported as a 8 /10. Location:  bilateral knee pain / . Onset: chronic . Quality:  sharp, shooting. Timing: intermittent . Modifying factor(s):  rest, walking, pain medications.  Vitals:  height is 5' (1.524 m) and weight is 171 lb (77.6 kg). Her temporal temperature is 97.2 F (36.2 C) (abnormal). Her blood pressure is 155/54 (abnormal) and her pulse is 66. Her respiration is 18 and oxygen saturation is 100%.  BMI: Estimated body mass index is 33.4 kg/m as calculated from the  following:   Height as of this encounter: 5' (1.524 m).   Weight as of this encounter: 171 lb (77.6 kg).  Last encounter: 08/21/2024. Last procedure: Visit date not found.  Reason for encounter: evaluation for possible interventional PM therapy/treatment and medication management. No change in medical history since last visit.  Patient's pain is at baseline.  Patient continues multimodal pain regimen as prescribed.  States that it provides pain relief and improvement in functional status.   Discussed the use of AI scribe software for clinical note transcription with the patient, who gave verbal consent to proceed.  History of Present Illness   Margaret [name] is a 68 year old female who presents with knee pain and requests bilateral knee injections.  Of note, she received therapeutic intra-articular knee injection on Mar 10, 2023, which provided significant pain relief approximately more than a year.  Having a flareup of bilateral knee pain, we discussed repeating knee injection on both sides.   She experiences significant pain in both knees, describing them as 'she's hating now.' The pain worsens with weather changes. She has previously received injections, although her last injection was for her shoulder.  She manages her knee pain with hydrocodone  10-325 mg, which she takes regularly without experiencing any side effects or adverse reaction.   Pharmacotherapy Assessment   Hydrocodone -acetaminophen  (Norco) 10-325 mg every 8 hours as needed for severe pain. MME=30  Monitoring: [place] PMP: PDMP reviewed during this encounter.       Pharmacotherapy: No side-effects or adverse reactions reported. Compliance: No problems identified. Effectiveness: Clinically acceptable.  [name], RN  08/28/2024 11:01 AM  Sign when Signing Visit Nursing Pain Medication Assessment:  Safety precautions to be maintained throughout the  outpatient stay will include: orient to surroundings, keep bed in low  position, maintain call bell within reach at all times, provide assistance with transfer out of bed and ambulation.  Medication Inspection Compliance: Pill count conducted under aseptic conditions, in front of the patient. Neither the pills nor the bottle was removed from the patient's sight at any time. Once count was completed pills were immediately returned to the patient in their original bottle.  Medication: Hydrocodone /APAP Pill/Patch Count: 0 of 90 pills/patches remain Pill/Patch Appearance: Markings consistent with prescribed medication Bottle Appearance: Standard pharmacy container. Clearly labeled. Filled Date: 09 / 29 / 2025 Last Medication intake:  TodaySafety precautions to be maintained throughout the outpatient stay will include: orient to surroundings, keep bed in low position, maintain call bell within reach at all times, provide assistance with transfer out of bed and ambulation.   Patient reports there are 6 pills in her pill container.    UDS:  Summary  Date Value Ref Range Status  05/24/2024 FINAL  Final    Comment:    ==================================================================== [employer] [facility] 13 (MW) ==================================================================== Test                             Result       Flag       Units  Drug Present   Hydrocodone                     3076                    ng/mg creat   Dihydrocodeine                 465                     ng/mg creat   Norhydrocodone                 2311                    ng/mg creat    Sources of hydrocodone  include scheduled prescription medications.    Dihydrocodeine and norhydrocodone are expected metabolites of    hydrocodone . Dihydrocodeine is also available as a scheduled    prescription medication.  ==================================================================== Test                      Result    Flag   Units      Ref Range   Creatinine              46               mg/dL       >=79 ==================================================================== Declared Medications:  Medication list was not provided. ==================================================================== For clinical consultation, please call [phone]. ====================================================================     No results found for: CBDTHCR No results found for: D8THCCBX No results found for: D9THCCBX  ROS  Constitutional: Denies any fever or chills Gastrointestinal: No reported hemesis, hematochezia, vomiting, or acute GI distress Musculoskeletal: Bilateral knee pain Neurological: No reported episodes of acute onset apraxia, aphasia, dysarthria, agnosia, amnesia, paralysis, loss of coordination, or loss of consciousness  Medication Review  ARIPiprazole , HYDROcodone -acetaminophen , Semaglutide (1 MG/DOSE), amLODipine , azithromycin , escitalopram , furosemide, glimepiride, lamoTRIgine , metFORMIN, ondansetron , pantoprazole , polyethylene glycol powder, rifaximin , spironolactone, and tizanidine  History Review  Allergy: Ms. Hanford is allergic to lithium, penicillins, sulfa antibiotics, and benzonatate. Drug: Ms. Schauer  reports that she does not currently use drugs. Alcohol:  reports current alcohol use. Tobacco:  reports that she quit smoking about 13 years ago. Her smoking use included cigarettes. She has never used smokeless tobacco. Social: Ms. Hem  reports that she quit smoking about 13 years ago. Her smoking use included cigarettes. She has never used smokeless tobacco. She reports current alcohol use. She reports that she does not currently use drugs. Medical:  has a past medical history of (HFpEF) heart failure with preserved ejection fraction (HCC), Anxiety, Aortic atherosclerosis, Aortic stenosis, Bilateral carotid artery disease, Bilateral renal cysts, Bipolar 1 disorder (HCC), CAD (coronary artery disease) (11/04/2022), Cardiac murmur, CKD (chronic  kidney disease), stage III (HCC), DDD (degenerative disc disease), lumbar, Depression, DM (diabetes mellitus), type 2 (HCC), Former smoker, Frequent falls, GERD (gastroesophageal reflux disease), H/O: GI bleed, Heart murmur, History of blood transfusion, History of MRSA infection (2017), HLD (hyperlipidemia), Hypertension, IDA (iron  deficiency anemia), 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H [facility]), Multiple pulmonary nodules, Nephrolithiasis, Occasional tremors, Osteoarthritis, Osteoporosis, Portal hypertensive gastropathy (HCC), Portal venous hypertension (HCC), Renal mass, 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 Surgical: Ms. Judy  has a past surgical history that includes Abdominal hysterectomy; Tonsillectomy; Cholecystectomy; Esophagogastroduodenoscopy (egd) with propofol  (N/A, 06/23/2019); Colonoscopy with propofol  (N/A, 06/23/2019); Esophagogastroduodenoscopy (egd) with propofol  (N/A, 12/08/2022); Colonoscopy with propofol  (N/A, 12/08/2022); Esophagogastroduodenoscopy (egd) with propofol  (N/A, 01/12/2023); Colonoscopy with propofol  (N/A, 05/18/2023); polypectomy (05/18/2023); IR Radiologist Eval & Mgmt (10/28/2023); Total hip arthroplasty (Right, 2017); IR Radiologist Eval & Mgmt (01/18/2024); Esophagogastroduodenoscopy (N/A, 02/15/2024); Colonoscopy (N/A, 02/15/2024); Colonoscopy (02/15/2024); Givens capsule study (02/15/2024); Polypectomy (02/15/2024); Hot hemostasis (02/15/2024); and Esophagogastroduodenoscopy (N/A, 07/04/2024). Family: family history includes Bipolar disorder in her brother, paternal uncle, and paternal uncle; Heart attack (age of onset: 57) in her father; Lung cancer in her mother; Mental illness in her father.  Laboratory Chemistry Profile   Renal Lab Results  Component Value Date   BUN 32 (H) 04/13/2024   CREATININE 1.32 (H) 04/13/2024   BCR 19 12/06/2019   GFRAA 114  12/06/2019   GFRNONAA 45 (L) 04/13/2024    Hepatic Lab Results  Component Value Date   AST 45 (H) 02/15/2024   ALT 24 02/15/2024   ALBUMIN  3.4 (L) 02/15/2024   ALKPHOS 83 02/15/2024    Electrolytes Lab Results  Component Value Date   NA 133 (L) 04/13/2024   K 4.5 04/13/2024   CL 100 04/13/2024   CALCIUM 8.6 (L) 04/13/2024    Bone No results found for: VD25OH, [id number], CI6874NY7, CI7874NY7, 25OHVITD1, 25OHVITD2, 25OHVITD3, TESTOFREE, TESTOSTERONE  Inflammation (CRP: Acute Phase) (ESR: Chronic Phase) No results found for: CRP, ESRSEDRATE, LATICACIDVEN       Note: Above Lab results reviewed.  Recent Imaging Review  MR ABDOMEN WWO CONTRAST CLINICAL DATA:  Chronic micronodular liver disease with areas of fibrosis, ascites and varices on prior imaging. Cirrhosis. 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  EXAM: MRI ABDOMEN WITHOUT AND WITH CONTRAST  TECHNIQUE: Multiplanar multisequence MR imaging of the abdomen was performed both before and after the administration of intravenous contrast.  CONTRAST:  8mL GADAVIST  GADOBUTROL  1 MMOL/ML IV SOLN  COMPARISON:  Abdominal ultrasound 11/15/2023 and 08/03/2023  FINDINGS: Lower chest:  The visualized lower chest appears unremarkable.  Hepatobiliary: Advanced hepatic cirrhosis again noted with diffuse contour irregularity of the liver, micronodularity and scattered fibrosis. No arterial phase enhancing lesion or restricted diffusion demonstrated. No significant intra or extrahepatic biliary dilatation  status post cholecystectomy.  Pancreas: Unremarkable. No pancreatic ductal dilatation or surrounding inflammatory changes.  Spleen: Stable mild splenomegaly (15.9 x 16.8 x 7.6 cm). No focal splenic lesion.  Adrenals/Urinary Tract: Both adrenal glands appear normal. Interval ablation of previously demonstrated mildly complex cystic lesion in the interpolar region of the  right kidney. Ablation defect measures approximately 2.3 x 2.7 x 1.9 cm and demonstrates a peripheral rim of low T2 signal and mild intrinsic T1 shortening. No suspicious enhancement or restricted diffusion. No suspicious renal lesions are identified. There is no hydronephrosis.  Stomach/Bowel: The stomach appears unremarkable for its degree of distension. No evidence of bowel wall thickening, distention or surrounding inflammatory change.  Vascular/Lymphatic: Similar prominent lymph nodes in the gastrohepatic ligament and porta hepatis, likely reactive. No acute vascular findings are demonstrated. Multiple left upper quadrant and retroperitoneal varices, as before.  Other: No evidence of abdominal wall hernia or ascites.  Musculoskeletal: No acute or significant osseous findings.  IMPRESSION: 1. Interval ablation of previously demonstrated mildly complex cystic lesion in the interpolar region of the right kidney. No evidence of local recurrence or metastatic disease. No suspicious renal lesions. 2. Advanced hepatic cirrhosis with evidence of portal hypertension including splenomegaly and varices. No suspicious hepatic lesions identified. 3. Similar prominent lymph nodes in the gastrohepatic ligament and porta hepatis, likely reactive.  Electronically Signed   By: Elsie Perone M.D.   On: 06/08/2024 15:44 Note: Reviewed        Physical Exam  Vitals: BP (!) 155/54 (Cuff Size: Normal)   Pulse 66   Temp (!) 97.2 F (36.2 C) (Temporal)   Resp 18   Ht 5' (1.524 m)   Wt 171 lb (77.6 kg)   SpO2 100%   BMI 33.40 kg/m  BMI: Estimated body mass index is 33.4 kg/m as calculated from the following:   Height as of this encounter: 5' (1.524 m).   Weight as of this encounter: 171 lb (77.6 kg). Ideal: Ideal body weight: 45.5 kg (100 lb 4.9 oz) Adjusted ideal body weight: 58.3 kg (128 lb 9.4 oz) General appearance: Well nourished, well developed, and well hydrated. In no apparent  acute distress Mental status: Alert, oriented x 3 (person, place, & time)       Respiratory: No evidence of acute respiratory distress Eyes: PERLA  Musculoskeletal: + Bilateral knee pain worse with walking, weightbearing, standing  Lumbar Spine Area Exam  Skin & Axial Inspection: No masses, redness, or swelling Alignment: Symmetrical Functional ROM: Pain restricted ROM       Stability: No instability detected Muscle Tone/Strength: Functionally intact. No obvious neuro-muscular anomalies detected. Sensory (Neurological): Musculoskeletal pain pattern   Lower Extremity Exam      Side: Right lower extremity   Side: Left lower extremity  Stability: No instability observed           Stability: No instability observed          Skin & Extremity Inspection: Skin color, temperature, and hair growth are WNL. No peripheral edema or cyanosis. No masses, redness, swelling, asymmetry, or associated skin lesions. No contractures.   Skin & Extremity Inspection: left ankle swelling  Functional ROM: Unrestricted ROM                   Functional ROM: Pain restricted ROM left knee          Muscle Tone/Strength: Functionally intact. No obvious neuro-muscular anomalies detected.   Muscle Tone/Strength: Functionally intact. No obvious neuro-muscular anomalies detected.  Sensory (  Neurological): Unimpaired         Sensory (Neurological): Arthropathic arthralgia        DTR: Patellar: deferred today Achilles: deferred today Plantar: deferred today   DTR: Patellar: deferred today Achilles: deferred today Plantar: deferred today  Palpation: No palpable anomalies   Palpation: No palpable anomalies   Assessment   Diagnosis Status  1. Bilateral primary osteoarthritis of knee   2. Chronic pain of left knee   3. Chronic pain syndrome   4. Encounter for long-term opiate analgesic use   5. History of left hip replacement (2017)   6. Lumbar facet arthropathy   7. Chronic radicular lumbar pain    Having a  Flare-up Having a Flare-up Having a Flare-up   Updated Problems: No problems updated.  Plan of Care  Problem-specific:  Assessment and Plan    Bilateral knee pain: The patient continues experiencing bilateral knee pain, which significantly impacts her daily functional mobility and activities of daily living.  We discussed repeating bilateral knee injection in 3 months.  Chronic pain syndrome: Patient's pain is well-controlled with hydrocodone , will continue on current medication regimen.  Prescribing drug monitoring (PMP) reviewed, findings consistent with the use of prescribed medication and no evidence of narcotic misuse or abuse.  Urine drug screening (UDS) up to date and consistent with prescribed medication.  No side effects or any adverse reaction reported to medication, however, the patient was advised to drink water  to prevent from opioid induced constipation.  Schedule follow-up in 90 days for medication management. Chronic pain syndrome managed with hydrocodone  without side effects. - Continue hydrocodone  10-325 mg as prescribed.   Chronic left knee pain: Discussed to repeat bilateral knee injection.   Plan: Schedule: (Clinic) (B) Knee injection # 1 with Dr. Lateef (Future)      Ms. Margaret Hammonds Fitzsimmons has a current medication list which includes the following long-term medication(s): aripiprazole , escitalopram , furosemide, lamotrigine , lamotrigine , metformin, pantoprazole , and spironolactone.  Pharmacotherapy (Medications Ordered): Meds ordered this encounter  Medications   HYDROcodone -acetaminophen  (NORCO) 10-325 MG tablet    Sig: Take 1 tablet by mouth every 8 (eight) hours as needed for severe pain (pain score 7-10). Must last 30 days.    Dispense:  90 tablet    Refill:  0    Chronic Pain: STOP Act (Not applicable) Fill 1 day early if closed on refill date. Avoid benzodiazepines within 8 hours of opioids   HYDROcodone -acetaminophen  (NORCO) 10-325 MG tablet    Sig:  Take 1 tablet by mouth every 8 (eight) hours as needed for severe pain (pain score 7-10). Must last 30 days.    Dispense:  90 tablet    Refill:  0    Chronic Pain: STOP Act (Not applicable) Fill 1 day early if closed on refill date. Avoid benzodiazepines within 8 hours of opioids   HYDROcodone -acetaminophen  (NORCO) 10-325 MG tablet    Sig: Take 1 tablet by mouth every 8 (eight) hours as needed for severe pain (pain score 7-10). Must last 30 days.    Dispense:  90 tablet    Refill:  0    Chronic Pain: STOP Act (Not applicable) Fill 1 day early if closed on refill date. Avoid benzodiazepines within 8 hours of opioids   Orders:  Orders Placed This Encounter  Procedures   KNEE INJECTION    Local Anesthetic & Steroid injection.    Standing Status:   Future    Expected Date:   11/28/2024    Expiration Date:  08/28/2025    Scheduling Instructions:     Procedure: Intra-articular Hyalgan steroid knee injection     Side(s): Bilateral Knee     Sedation: With Sedation.     Timeframe: as soon as schedule    Where will this procedure be performed?:   [facility]        Return in about 3 months (around 11/28/2024) for (F2F), (MM), Emmy Blanch NP.    Recent Visits No visits were found meeting these conditions. Showing recent visits within past 90 days and meeting all other requirements Today's Visits Date Type Provider Dept  08/28/24 Office Visit Reeve Turnley K, NP [facility]  Showing today's visits and meeting all other requirements Future Appointments No visits were found meeting these conditions. Showing future appointments within next 90 days and meeting all other requirements  I discussed the assessment and treatment plan with the patient. The patient was provided an opportunity to ask questions and all were answered. The patient agreed with the plan and demonstrated an understanding of the instructions.  Patient advised to call back or seek an in-person evaluation  if the symptoms or condition worsens.  I personally spent a total of 30 minutes in the care of the patient today including preparing to see the patient, getting/reviewing separately obtained history, performing a medically appropriate exam/evaluation, counseling and educating, placing orders, referring and communicating with other health care professionals, documenting clinical information in the EHR, independently interpreting results, communicating results, and coordinating care.   Note by: Marisue Canion K Annasofia Pohl, NP (TTS and AI technology used. I apologize for any typographical errors that were not detected and corrected.) Date: 08/28/2024; Time: 1:20 PM

## 2024-08-28 NOTE — Patient Instructions (Signed)

## 2024-08-29 ENCOUNTER — Encounter: Payer: Self-pay | Admitting: Psychiatry

## 2024-08-29 ENCOUNTER — Ambulatory Visit: Admitting: Psychiatry

## 2024-08-29 ENCOUNTER — Other Ambulatory Visit: Payer: Self-pay

## 2024-08-29 VITALS — BP 144/68 | HR 68 | Temp 97.4°F | Ht 60.0 in | Wt 170.2 lb

## 2024-08-29 DIAGNOSIS — F418 Other specified anxiety disorders: Secondary | ICD-10-CM | POA: Diagnosis not present

## 2024-08-29 DIAGNOSIS — F3111 Bipolar disorder, current episode manic without psychotic features, mild: Secondary | ICD-10-CM | POA: Insufficient documentation

## 2024-08-29 NOTE — Progress Notes (Unsigned)
 [facility] MD OP Progress Note  08/29/2024 4:39 PM Margaret [name]  MRN:  [id number]  Chief Complaint:  Chief Complaint  Patient presents with   Follow-up   Manic Behavior   Medication Refill   Insomnia   Discussed the use of AI scribe software for clinical note transcription with the patient, who gave verbal consent to proceed.  History of Present Illness Margaret [name] is a 68 year old female Caucasian, lives in [place], has a history of bipolar disorder, other specified anxiety disorder, hyperlipidemia, liver cirrhosis, right kidney complex cyst, thrombocytopenia, chronic pain was evaluated in office today for a follow-up appointment.  Over several days, she has experienced elevated mood, increased energy, and anxiety, describing herself as feeling up, up, up and unable to sit still. Increased activity, restlessness, and frequent urges to get up at home have affected her routine. She reports recent impulsive spending, which her son confronted her about. She notes that these manic symptoms have persisted for several days and remain ongoing at the time of the visit, though she feels able to manage and states the symptoms are not currently causing significant trouble in her daily functioning. She describes the first 5 days of the episode as more disruptive but now feels she is managing better.  She is currently not doing anything to impulsive and stopped spending.  She does have sleep problems however that is mostly because of her need to void rather than manic symptoms.  Prior to the current manic symptoms, she experienced a brief period of mild depression. She denies depressive symptoms in the past few days. She reports adequate appetite, though she notes her blood sugar increased due to eating more while she was less physically active.   She denies suicidal thoughts, thoughts of harming others, and auditory hallucinations. She reports no memory concerns and states she performed  adequately on recent memory test during a home visit from her insurance company.  Her current medications include lamotrigine  225 mg, aripiprazole  10 mg, and escitalopram  10 mg. She confirms ongoing adherence to these medications and reports no recent changes to her regimen.  She appeared to be alert, oriented to person place time situation.  3 word memory immediate 3 out of 3, after 5 minutes 3 out of 3.  Was able to do simple calculations well.   Visit Diagnosis:    ICD-10-CM   1. Bipolar 1 disorder, manic, mild (HCC)  F31.11    improving    2. Other specified anxiety disorders  F41.8    With limited symptom attacks      Past Psychiatric History: I have reviewed past psychiatric history from progress note on 08/21/2019.  Past trials of Depakote, lithium, risperidone, Xanax   Past Medical History:  Past Medical History:  Diagnosis Date   (HFpEF) heart failure with preserved ejection fraction (HCC)    a.) TTE 12/15/2022: EF >55%, mild LVH, G1DD, mild LAE, mild MAC, triv AR/MR/TR, mild TR, RVSP 30.7, mod AS (MPG 20)   Anxiety    Aortic atherosclerosis    Aortic stenosis    a.) TTE 12/15/2022: moderate AS (MPG 20 mmHg; AVA = 1.7 cm2)   Bilateral carotid artery disease    a.) doppler 12/22/2020: <50% BICA   Bilateral renal cysts    Bipolar 1 disorder (HCC)    CAD (coronary artery disease) 11/04/2022   a.) CT chest 11/04/2022: 3v CAD   Cardiac murmur    CKD (chronic kidney disease), stage III (HCC)    DDD (degenerative disc disease),  lumbar    Depression    DM (diabetes mellitus), type 2 (HCC)    Former smoker    Frequent falls    GERD (gastroesophageal reflux disease)    H/O: GI bleed    Heart murmur    History of blood transfusion    History of MRSA infection 2017   HLD (hyperlipidemia)    Hypertension    IDA (iron  deficiency anemia)    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)    Multiple pulmonary nodules    Nephrolithiasis    Occasional tremors    Osteoarthritis     Osteoporosis    Portal hypertensive gastropathy (HCC)    Portal venous hypertension (HCC)    Renal mass, right 03/30/2023   a.) MRI ABD 03/30/2023: 1.2 x 1.2 cm complex cystic renal cortical mass in the posterior mid to low RIGHT kidney (Bosniak 3); b.) MRI ABD 08/03/2023: interval increase in size to 1.4 x 1.3 cm -> concerning for RCC; c.) US  ABD 10/12/2023: 1.7 complex cystic mass RIGHT kidney; d.) MRI ABD 11/15/2023: interval increase in size to 1.6 x 1.2 x 1.0 cm   Splenomegaly    Thoracic spondylosis    Thrombocytopenia    Tubular adenoma of colon    Uses walker    Venous insufficiency    Ventral hernia     Past Surgical History:  Procedure Laterality Date   ABDOMINAL HYSTERECTOMY     CHOLECYSTECTOMY     COLONOSCOPY N/A 02/15/2024   Procedure: COLONOSCOPY;  Surgeon: Jinny Carmine, MD;  Location: [facility] ENDOSCOPY;  Service: Endoscopy;  Laterality: N/A;   COLONOSCOPY  02/15/2024   Procedure: COLONOSCOPY, WITH ARGON PLASMA COAGULATION;  Surgeon: Jinny Carmine, MD;  Location: [facility] ENDOSCOPY;  Service: Endoscopy;;   COLONOSCOPY WITH PROPOFOL  N/A 06/23/2019   Procedure: COLONOSCOPY WITH PROPOFOL ;  Surgeon: Therisa Bi, MD;  Location[facility] ENDOSCOPY;  Service: Gastroenterology;  Laterality: N/A;   COLONOSCOPY WITH PROPOFOL  N/A 12/08/2022   Procedure: COLONOSCOPY WITH PROPOFOL ;  Surgeon: Maryruth Ole DASEN, MD;  Location: [facility] ENDOSCOPY;  Service: Endoscopy;  Laterality: N/A;   COLONOSCOPY WITH PROPOFOL  N/A 05/18/2023   Procedure: COLONOSCOPY WITH PROPOFOL ;  Surgeon: Maryruth Ole DASEN, MD;  Location: [facility] ENDOSCOPY;  Service: Endoscopy;  Laterality: N/A;   ESOPHAGOGASTRODUODENOSCOPY N/A 02/15/2024   Procedure: EGD (ESOPHAGOGASTRODUODENOSCOPY);  Surgeon: Jinny Carmine, MD;  Location: [facility] ENDOSCOPY;  Service: Endoscopy;  Laterality: N/A;   ESOPHAGOGASTRODUODENOSCOPY N/A 07/04/2024   Procedure: EGD (ESOPHAGOGASTRODUODENOSCOPY);  Surgeon: Maryruth Ole DASEN, MD;  Location: [facility] ENDOSCOPY;  Service:  Endoscopy;  Laterality: N/A;   ESOPHAGOGASTRODUODENOSCOPY (EGD) WITH PROPOFOL  N/A 06/23/2019   Procedure: ESOPHAGOGASTRODUODENOSCOPY (EGD) WITH PROPOFOL ;  Surgeon: Therisa Bi, MD;  Location: [facility] ENDOSCOPY;  Service: Gastroenterology;  Laterality: N/A;   ESOPHAGOGASTRODUODENOSCOPY (EGD) WITH PROPOFOL  N/A 12/08/2022   Procedure: ESOPHAGOGASTRODUODENOSCOPY (EGD) WITH PROPOFOL ;  Surgeon: Maryruth Ole DASEN, MD;  Location: [facility] ENDOSCOPY;  Service: Endoscopy;  Laterality: N/A;  DM, OZEMPIC   ESOPHAGOGASTRODUODENOSCOPY (EGD) WITH PROPOFOL  N/A 01/12/2023   Procedure: ESOPHAGOGASTRODUODENOSCOPY (EGD) WITH PROPOFOL ;  Surgeon: Maryruth Ole DASEN, MD;  Location: [facility] ENDOSCOPY;  Service: Endoscopy;  Laterality: N/A;  DM   GIVENS CAPSULE STUDY  02/15/2024   Procedure: IMAGING PROCEDURE, GI TRACT, INTRALUMINAL, VIA CAPSULE;  Surgeon: Jinny Carmine, MD;  Location: [facility] ENDOSCOPY;  Service: Endoscopy;;   HOT HEMOSTASIS  02/15/2024   Procedure: EGD, WITH ARGON PLASMA COAGULATION;  Surgeon: Jinny Carmine, MD;  Location: [facility] ENDOSCOPY;  Service: Endoscopy;;   IR RADIOLOGIST EVAL & MGMT  10/28/2023   IR  RADIOLOGIST EVAL & MGMT  01/18/2024   POLYPECTOMY  05/18/2023   Procedure: POLYPECTOMY;  Surgeon: Maryruth Ole DASEN, MD;  Location: [facility] ENDOSCOPY;  Service: Endoscopy;;   POLYPECTOMY  02/15/2024   Procedure: POLYPECTOMY, INTESTINE;  Surgeon: Jinny Carmine, MD;  Location: [facility] ENDOSCOPY;  Service: Endoscopy;;   TONSILLECTOMY     TOTAL HIP ARTHROPLASTY Right 2017    Family Psychiatric History: Reviewed family psychiatric history from progress note on 08/21/2019.  Family History:  Family History  Problem Relation Age of Onset   Lung cancer Mother    Heart attack Father 7   Mental illness Father    Bipolar disorder Brother    Bipolar disorder Paternal Uncle    Bipolar disorder Paternal Uncle     Social History: I have reviewed social history from progress note on 08/21/2019. Social History    Socioeconomic History   Marital status: Divorced    Spouse name: Not on file   Number of children: 2   Years of education: Not on file   Highest education level: High school graduate  Occupational History   Not on file  Tobacco Use   Smoking status: Former    Current packs/day: 0.00    Types: Cigarettes    Quit date: 03/03/2011    Years since quitting: 13.5   Smokeless tobacco: Never  Vaping Use   Vaping status: Never Used  Substance and Sexual Activity   Alcohol use: Yes    Comment: rarely   Drug use: Not Currently   Sexual activity: Not Currently  Other Topics Concern   Not on file  Social History Narrative   Not on file   Social Drivers of Health   Financial Resource Strain: Low Risk  (05/04/2024)   Received from [facility]   Overall Financial Resource Strain (CARDIA)    Difficulty of Paying Living Expenses: Not hard at all  Food Insecurity: No Food Insecurity (05/04/2024)   Received [facility]   Hunger Vital Sign    Within the past 12 months, you worried that your food would run out before you got the money to buy more.: Never true    Within the past 12 months, the food you bought just didn't last and you didn't have money to get more.: Never true  Transportation Needs: No Transportation Needs (05/04/2024)   Received from [facility] - Transportation    In the past 12 months, has lack of transportation kept you from medical appointments or from getting medications?: No    Lack of Transportation (Non-Medical): No  Physical Activity: Inactive (06/16/2021)   Received from [facility]   Exercise Vital Sign    On average, how many days per week do you engage in moderate to strenuous exercise (like a brisk walk)?: 0 days    On average, how many minutes do you engage in exercise at this level?: 0 min  Stress: No Stress Concern Present (06/16/2021)   Received from [facility] - Occupational Stress Questionnaire  Social Connections: Moderately Integrated (02/14/2024)   Social Connection and Isolation Panel    Frequency of Communication with Friends and Family: More than three times a week    Frequency of Social Gatherings with Friends and Family: More than three times a week    Attends Religious Services: More than 4 times per year    Active Member of [employer] or Organizations:  Yes    Attends Club or Organization Meetings: More than 4 times per year    Marital Status: Divorced    Allergies:  Allergies  Allergen Reactions   Lithium Nausea And Vomiting and Other (See Comments)   Penicillins Hives    Resulted in hospitalization   Sulfa Antibiotics Hives    Resulted in hospitalization   Benzonatate Other (See Comments)    Metabolic Disorder Labs: Lab Results  Component Value Date   HGBA1C 7.3 (H) 06/22/2019   MPG 162.81 06/22/2019   No results found for: PROLACTIN No results found for: CHOL, TRIG, HDL, CHOLHDL, VLDL, LDLCALC Lab Results  Component Value Date   TSH 1.463 12/29/2022   TSH 1.737 11/16/2019    Therapeutic Level Labs: No results found for: LITHIUM No results found for: VALPROATE No results found for: CBMZ  Current Medications: Current Outpatient Medications  Medication Sig Dispense Refill   amLODipine  (NORVASC ) 5 MG tablet Take 5 mg by mouth daily.     ARIPiprazole  (ABILIFY ) 10 MG tablet Take 1 tablet (10 mg total) by mouth daily. 90 tablet 3   escitalopram  (LEXAPRO ) 10 MG tablet TAKE ONE TABLET BY MOUTH ONE TIME DAILY 90 tablet 3   furosemide (LASIX) 20 MG tablet Take 20 mg by mouth daily as needed for fluid or edema.     glimepiride (AMARYL) 2 MG tablet Take 2 mg by mouth every morning.     HYDROcodone -acetaminophen  (NORCO) 10-325 MG tablet Take 1 tablet by mouth every 8 (eight) hours as needed for severe pain (pain score 7-10). Must last 30 days. 90 tablet 0    [START ON 09/29/2024] HYDROcodone -acetaminophen  (NORCO) 10-325 MG tablet Take 1 tablet by mouth every 8 (eight) hours as needed for severe pain (pain score 7-10). Must last 30 days. 90 tablet 0   [START ON 10/29/2024] HYDROcodone -acetaminophen  (NORCO) 10-325 MG tablet Take 1 tablet by mouth every 8 (eight) hours as needed for severe pain (pain score 7-10). Must last 30 days. 90 tablet 0   lamoTRIgine  (LAMICTAL ) 200 MG tablet TAKE ONE TABLET BY MOUTH ONE TIME DAILY 90 tablet 3   lamoTRIgine  (LAMICTAL ) 25 MG tablet Take 1 tablet (25 mg total) by mouth daily. Take daily with 200 mg , total of 225 mg 90 tablet 1   metFORMIN (GLUCOPHAGE) 500 MG tablet Take 500 mg by mouth 2 (two) times daily with a meal.     ondansetron  (ZOFRAN ) 8 MG tablet Take 1 tablet (8 mg total) by mouth every 8 (eight) hours as needed for nausea or vomiting. 30 tablet 0   OZEMPIC, 1 MG/DOSE, 4 MG/3ML SOPN Inject 1 mg into the skin once a week. Sundays     pantoprazole  (PROTONIX ) 40 MG tablet Take 40 mg by mouth every morning.     polyethylene glycol powder (GLYCOLAX/MIRALAX) 17 GM/SCOOP powder Take 17 g by mouth daily.     spironolactone (ALDACTONE) 25 MG tablet Take 25 mg by mouth daily.     tizanidine (ZANAFLEX) 2 MG capsule Take 2 mg by mouth 3 (three) times daily as needed for muscle spasms. Takes differently     XIFAXAN  550 MG TABS tablet Take 550 mg by mouth 2 (two) times daily.     No current facility-administered medications for this visit.   Facility-Administered Medications Ordered in Other Visits  Medication Dose Route Frequency Provider Last Rate Last Admin   sodium chloride  flush (NS) 0.9 % injection 10 mL  10 mL Intracatheter Once PRN Brahmanday, Govinda R, MD  Musculoskeletal: Strength & Muscle Tone: within normal limits Gait & Station: uses a walker Patient leans: Front  Psychiatric Specialty Exam: Review of Systems  Psychiatric/Behavioral:  Positive for sleep disturbance.        Recent mania  getting better    Blood pressure (!) 144/68, pulse 68, temperature (!) 97.4 F (36.3 C), temperature source Temporal, height 5' (1.524 m), weight 170 lb 3.2 oz (77.2 kg).Body mass index is 33.24 kg/m.  General Appearance: Casual  Eye Contact:  Fair  Speech:  Clear and Coherent  Volume:  Normal  Mood:  mania , getting better  Affect:  Appropriate  Thought Process:  Goal Directed and Descriptions of Associations: Intact  Orientation:  Full (Time, Place, and Person)  Thought Content: Logical   Suicidal Thoughts:  No  Homicidal Thoughts:  No  Memory:  Immediate;   Fair Recent;   Fair Remote;   Fair  Judgement:  Fair  Insight:  Fair  Psychomotor Activity:  Normal  Concentration:  Concentration: Fair and Attention Span: Fair  Recall:  [employer] of Knowledge: Fair  Language: Fair  Akathisia:  No  Handed:  Right  AIMS (if indicated): done  Assets:  Communication Skills Desire for Improvement Housing Social Support Talents/Skills Transportation  ADL's:  Intact  Cognition: WNL  Sleep:  varies due to need to void   Screenings: Geneticist, Molecular Office Visit from 08/29/2024 in [facility] Office Visit from 07/23/2023 in [facility] Video Visit from 03/16/2023 in [facility] Office Visit from 10/13/2022 in [facility] Video Visit from 05/14/2022 in [facility]  AIMS Total Score 0 0 0 0 0   GAD-7    Flowsheet Row Office Visit from 08/29/2024 in [facility] Office Visit from 07/23/2023 in [facility] Office Visit from 10/13/2022 in [facility] Video Visit from 02/13/2022 in [facility]  Total GAD-7 Score 5 2 2 2     PHQ2-9    Flowsheet Row Office Visit from 08/29/2024 in [facility] Office Visit from 08/28/2024 in [facility] Visit from 08/17/2024 in [facility] [place]. [facility] Infusion from 07/20/2024 in [facility] Of [place]. [facility] Infusion from 07/13/2024 in [facility] Of [place]. [facility]  PHQ-2 Total Score 2 1 0 0 0  PHQ-9 Total Score 8 -- -- -- --   Flowsheet Row Office Visit from 08/29/2024 in [facility] Office Visit from 08/17/2024 in [facility] Of [place]. [facility] Infusion from 07/20/2024 in [facility] Of [place]. [facility]  C-SSRS RISK CATEGORY No Risk No Risk No Risk     Assessment and Plan: Stephie Xu is a 53 year old Caucasian female who presented for a follow-up appointment discussed assessment and plan as noted below.  1. Bipolar 1 disorder, manic, mild (HCC)-improving Recently went through a manic episode although currently it is better managed on the current medication regimen.  Agrees to monitor her symptoms closely if  anything changes. Continue Lamictal  225 mg daily Continue Abilify  10 mg daily Continue Benztropine  0.5 mg as needed for tremors  2. Other specified anxiety disorders-stable Currently denies any significant anxiety symptoms other than situational anxiety.  Managed on current medication regimen. Continue Lexapro  10 mg daily-reduced dosage Continue Hydroxyzine  25-50 mg at bedtime as needed  Follow-up Follow-up in clinic in 6 weeks or sooner if needed.    Collaboration of Care: Collaboration of Care: Primary Care Provider AEB encouraged to follow-up with primary care provider for elevated blood pressure  reading.  Patient/Guardian was advised Release of Information must be obtained prior to any record release in order to collaborate their care with an outside provider. Patient/Guardian was advised if they have not already done so to contact the registration department to sign all necessary forms in order for us  to release information regarding their care.   Consent: Patient/Guardian gives verbal consent for treatment and assignment of benefits for services provided during this visit. Patient/Guardian expressed understanding and agreed to proceed.  This note was generated in part or whole with voice recognition software. Voice recognition is usually quite accurate but there are transcription errors that can and very often do occur. I apologize for any typographical errors that were not detected and corrected.     Nikeshia Keetch, MD 08/30/2024, 9:31 AM

## 2024-09-01 ENCOUNTER — Encounter: Payer: Self-pay | Admitting: Internal Medicine

## 2024-09-01 ENCOUNTER — Inpatient Hospital Stay

## 2024-09-01 VITALS — BP 134/56 | HR 67 | Temp 97.4°F | Resp 18

## 2024-09-01 DIAGNOSIS — D500 Iron deficiency anemia secondary to blood loss (chronic): Secondary | ICD-10-CM

## 2024-09-01 DIAGNOSIS — D649 Anemia, unspecified: Secondary | ICD-10-CM

## 2024-09-01 LAB — SAMPLE TO BLOOD BANK

## 2024-09-01 LAB — HEMOGLOBIN AND HEMATOCRIT (CANCER CENTER ONLY)
HCT: 40.5 % (ref 36.0–46.0)
Hemoglobin: 13.6 g/dL (ref 12.0–15.0)

## 2024-09-01 MED ORDER — SODIUM CHLORIDE 0.9% FLUSH
10.0000 mL | Freq: Once | INTRAVENOUS | Status: AC | PRN
  Administered 2024-09-01: 10 mL
  Filled 2024-09-01: qty 10

## 2024-09-01 MED ORDER — IRON SUCROSE 20 MG/ML IV SOLN
200.0000 mg | Freq: Once | INTRAVENOUS | Status: AC
  Administered 2024-09-01: 200 mg via INTRAVENOUS
  Filled 2024-09-01: qty 10

## 2024-09-04 ENCOUNTER — Inpatient Hospital Stay

## 2024-09-14 ENCOUNTER — Other Ambulatory Visit: Payer: Self-pay

## 2024-09-14 DIAGNOSIS — D500 Iron deficiency anemia secondary to blood loss (chronic): Secondary | ICD-10-CM

## 2024-09-15 ENCOUNTER — Inpatient Hospital Stay: Attending: Internal Medicine

## 2024-09-15 ENCOUNTER — Inpatient Hospital Stay

## 2024-09-15 VITALS — BP 148/57 | HR 69 | Temp 97.6°F | Resp 18

## 2024-09-15 DIAGNOSIS — D500 Iron deficiency anemia secondary to blood loss (chronic): Secondary | ICD-10-CM

## 2024-09-15 DIAGNOSIS — K922 Gastrointestinal hemorrhage, unspecified: Secondary | ICD-10-CM | POA: Diagnosis present

## 2024-09-15 LAB — SAMPLE TO BLOOD BANK

## 2024-09-15 LAB — HEMOGLOBIN AND HEMATOCRIT (CANCER CENTER ONLY)
HCT: 38.3 % (ref 36.0–46.0)
Hemoglobin: 12.9 g/dL (ref 12.0–15.0)

## 2024-09-15 MED ORDER — IRON SUCROSE 20 MG/ML IV SOLN
200.0000 mg | Freq: Once | INTRAVENOUS | Status: AC
  Administered 2024-09-15: 200 mg via INTRAVENOUS
  Filled 2024-09-15: qty 10

## 2024-09-15 NOTE — Patient Instructions (Signed)

## 2024-09-18 ENCOUNTER — Inpatient Hospital Stay

## 2024-09-21 ENCOUNTER — Ambulatory Visit: Admitting: Psychiatry

## 2024-09-23 ENCOUNTER — Other Ambulatory Visit: Payer: Self-pay | Admitting: Psychiatry

## 2024-09-23 DIAGNOSIS — F3176 Bipolar disorder, in full remission, most recent episode depressed: Secondary | ICD-10-CM

## 2024-09-23 DIAGNOSIS — F418 Other specified anxiety disorders: Secondary | ICD-10-CM

## 2024-10-02 ENCOUNTER — Inpatient Hospital Stay

## 2024-10-02 ENCOUNTER — Inpatient Hospital Stay: Admitting: Internal Medicine

## 2024-10-02 ENCOUNTER — Encounter: Payer: Self-pay | Admitting: Internal Medicine

## 2024-10-02 ENCOUNTER — Inpatient Hospital Stay: Attending: Internal Medicine

## 2024-10-02 VITALS — BP 135/59 | HR 68 | Temp 98.3°F | Resp 18 | Ht 60.0 in | Wt 171.5 lb

## 2024-10-02 VITALS — BP 117/45 | HR 65 | Resp 18

## 2024-10-02 DIAGNOSIS — Z79899 Other long term (current) drug therapy: Secondary | ICD-10-CM | POA: Diagnosis not present

## 2024-10-02 DIAGNOSIS — D500 Iron deficiency anemia secondary to blood loss (chronic): Secondary | ICD-10-CM | POA: Insufficient documentation

## 2024-10-02 DIAGNOSIS — K922 Gastrointestinal hemorrhage, unspecified: Secondary | ICD-10-CM | POA: Insufficient documentation

## 2024-10-02 DIAGNOSIS — D649 Anemia, unspecified: Secondary | ICD-10-CM

## 2024-10-02 LAB — CBC WITH DIFFERENTIAL (CANCER CENTER ONLY)
Abs Immature Granulocytes: 0.04 K/uL (ref 0.00–0.07)
Basophils Absolute: 0 K/uL (ref 0.0–0.1)
Basophils Relative: 0 %
Eosinophils Absolute: 0 K/uL (ref 0.0–0.5)
Eosinophils Relative: 1 %
HCT: 37 % (ref 36.0–46.0)
Hemoglobin: 12.5 g/dL (ref 12.0–15.0)
Immature Granulocytes: 1 %
Lymphocytes Relative: 12 %
Lymphs Abs: 0.8 K/uL (ref 0.7–4.0)
MCH: 30.1 pg (ref 26.0–34.0)
MCHC: 33.8 g/dL (ref 30.0–36.0)
MCV: 89.2 fL (ref 80.0–100.0)
Monocytes Absolute: 0.5 K/uL (ref 0.1–1.0)
Monocytes Relative: 7 %
Neutro Abs: 5.5 K/uL (ref 1.7–7.7)
Neutrophils Relative %: 79 %
Platelet Count: 96 K/uL — ABNORMAL LOW (ref 150–400)
RBC: 4.15 MIL/uL (ref 3.87–5.11)
RDW: 15.8 % — ABNORMAL HIGH (ref 11.5–15.5)
WBC Count: 6.9 K/uL (ref 4.0–10.5)
nRBC: 0 % (ref 0.0–0.2)

## 2024-10-02 LAB — IRON AND TIBC
Iron: 127 ug/dL (ref 28–170)
Saturation Ratios: 31 % (ref 10.4–31.8)
TIBC: 409 ug/dL (ref 250–450)
UIBC: 282 ug/dL

## 2024-10-02 LAB — CMP (CANCER CENTER ONLY)
ALT: 37 U/L (ref 0–44)
AST: 38 U/L (ref 15–41)
Albumin: 3.8 g/dL (ref 3.5–5.0)
Alkaline Phosphatase: 122 U/L (ref 38–126)
Anion gap: 11 (ref 5–15)
BUN: 32 mg/dL — ABNORMAL HIGH (ref 8–23)
CO2: 23 mmol/L (ref 22–32)
Calcium: 9 mg/dL (ref 8.9–10.3)
Chloride: 98 mmol/L (ref 98–111)
Creatinine: 1.02 mg/dL — ABNORMAL HIGH (ref 0.44–1.00)
GFR, Estimated: >60 mL/min (ref >60)
Glucose, Bld: 333 mg/dL — ABNORMAL HIGH (ref 70–99)
Potassium: 4.2 mmol/L (ref 3.5–5.1)
Sodium: 132 mmol/L — ABNORMAL LOW (ref 135–145)
Total Bilirubin: 1.6 mg/dL — ABNORMAL HIGH (ref 0.0–1.2)
Total Protein: 7.1 g/dL (ref 6.5–8.1)

## 2024-10-02 LAB — SAMPLE TO BLOOD BANK

## 2024-10-02 LAB — FERRITIN: Ferritin: 232 ng/mL (ref 11–307)

## 2024-10-02 MED ORDER — IRON SUCROSE 20 MG/ML IV SOLN
200.0000 mg | Freq: Once | INTRAVENOUS | Status: AC
  Administered 2024-10-02: 200 mg via INTRAVENOUS
  Filled 2024-10-02: qty 10

## 2024-10-02 MED ORDER — ONDANSETRON HCL 4 MG/2ML IJ SOLN: 4.0000 mg | Freq: Once | INTRAMUSCULAR | Status: DC

## 2024-10-02 NOTE — Assessment & Plan Note (Addendum)
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  FEB 2024- Iron  sat 9 Ferritin 45- wnl-PCP; Hb 9-10.  s/p IV iron  x4 [JAN 2024]- MM panel-WNL.   # Today Hb  12.5- proceed with Venofer .   # Etiology: Cirrhosis/GI bleed FEB 2024- GAVE -non-bleeding-; colo-? Capsule study.  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  Hold off EGD for now-.  # Easy bruising -mild thrombocytopenia > 100-secondary cirrhosis hypersplenism-monitor for now.  #Liver cirrhosis, splenomegaly, small volume ascites.  Patient does not drink alcohol.? NAFLD-AFP. Patient will need screening ultrasounds [? [facility]]-US - 2024- March - cirrhosis;MAY 2024-MRI per GI - NO malignancy noted. Defer to  re: surveillance.   # Right kidney complex cyst [incidental MAY 2024- MRI]- s/p urology, Dr.Stoioff- MRi pending.    # weight loss sec to oezmpic [40 pounds]- stable. Low concerns for malignancy at this time.   # DISPOSITION: # cancel blood transfusion # venofer  today # venofer  in 3 weeks   # follow up in 6 weeks- APP- labs- cbc/HOLD tube;Venofer - possible D-2 1 unit PRBC-Dr.B

## 2024-10-02 NOTE — Progress Notes (Signed)
 Fatigue/weakness: NO Dyspena: YES Light headedness: NO Blood in stool: NO

## 2024-10-02 NOTE — Progress Notes (Signed)
 [facility] CONSULT NOTE  Patient Care Team: Cleotilde Oneil FALCON, MD as PCP - General (Internal Medicine) Margaret Cindy SAUNDERS, MD as Consulting Physician (Internal Medicine) Maryruth Ole DASEN, MD as Consulting Physician (Gastroenterology)  CHIEF COMPLAINTS/PURPOSE OF CONSULTATION: Anemia/thrombocytopenia   HEMATOLOGY HISTORY  06/21/2019-06/24/2019 due to severe anemia; hemoglobin of 5.7 upon admission; patient received 2 units of blood transfusion, 3 doses of IV iron . EGD and colonoscopy were performed during the hospitalization EGD and colonoscopy on 06/23/2019. [Dr.Anna; [facility]] EGD showed no abnormalities and the colonoscopy showed 3 sessile diminutive polyps which were resected completely.  Arteries of the duodenum were normal.  3 colon polyps resected were serrated polyp x2 and a tubular adenoma x1. No active source of bleeding was noted.    # Cirrhosis NAFLD- [? [facility]]  HISTORY OF PRESENTING ILLNESS: ambulating with rolling walker [arthritis].   Margaret [name] 68 y.o.  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   Discussed the use of AI scribe software for clinical note transcription with the patient, who gave verbal consent to proceed.  History of Present Illness   Margaret [name] is a 68 year old female with anemia and cirrhosis who presents for follow-up of her anemia treatment.  She has a history of severe anemia with hemoglobin levels previously dropping to 5.1 g/dL, causing symptoms of fatigue and feeling extremely cold. She has been receiving iron  infusions since September, which have improved her hemoglobin levels to 12.5 g/dL. She has received seven or eight infusions, and the treatment appears effective in maintaining her hemoglobin levels.  She has noticed new bruising on her legs, described as clusters of  small bruises the size of a pencil eraser. She has been taking Ozempic for over a year without prior bruising issues. She is concerned about the sudden onset of bruising.      Review of Systems  Constitutional:  Positive for malaise/fatigue. Negative for chills, diaphoresis, fever and weight loss.  HENT:  Negative for nosebleeds and sore throat.   Eyes:  Negative for double vision.  Respiratory:  Positive for shortness of breath. Negative for cough, hemoptysis, sputum production and wheezing.   Cardiovascular:  Positive for palpitations. Negative for chest pain, orthopnea and leg swelling.  Gastrointestinal:  Negative for abdominal pain, blood in stool, constipation, diarrhea, heartburn, melena, nausea and vomiting.  Genitourinary:  Negative for dysuria, frequency and urgency.  Musculoskeletal:  Positive for back pain and joint pain.  Skin: Negative.  Negative for itching and rash.  Neurological:  Negative for dizziness, tingling, focal weakness, weakness and headaches.  Endo/Heme/Allergies:  Does not bruise/bleed easily.  Psychiatric/Behavioral:  Negative for depression. The patient is not nervous/anxious and does not have insomnia.    MEDICAL HISTORY:  Past Medical History:  Diagnosis Date   (HFpEF) heart failure with preserved ejection fraction (HCC)    a.) TTE 12/15/2022: EF >55%, mild LVH, G1DD, mild LAE, mild MAC, triv AR/MR/TR, mild TR, RVSP 30.7, mod AS (MPG 20)   Anxiety    Aortic atherosclerosis    Aortic stenosis    a.) TTE 12/15/2022: moderate AS (MPG 20 mmHg; AVA = 1.7 cm2)   Bilateral carotid artery disease    a.) doppler 12/22/2020: <50% BICA   Bilateral renal cysts    Bipolar 1 disorder (HCC)    CAD (coronary artery disease) 11/04/2022   a.) CT chest 11/04/2022: 3v  CAD   Cardiac murmur    CKD (chronic kidney disease), stage III (HCC)    DDD (degenerative disc disease), lumbar    Depression    DM (diabetes mellitus), type 2 (HCC)    Former smoker    Frequent falls     GERD (gastroesophageal reflux disease)    H/O: GI bleed    Heart murmur    History of blood transfusion    History of MRSA infection 2017   HLD (hyperlipidemia)    Hypertension    IDA (iron  deficiency anemia)    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H [facility])    Multiple pulmonary nodules    Nephrolithiasis    Occasional tremors    Osteoarthritis    Osteoporosis    Portal hypertensive gastropathy (HCC)    Portal venous hypertension (HCC)    Renal mass, right 03/30/2023   a.) MRI ABD 03/30/2023: 1.2 x 1.2 cm complex cystic renal cortical mass in the posterior mid to low RIGHT kidney (Bosniak 3); b.) MRI ABD 08/03/2023: interval increase in size to 1.4 x 1.3 cm -> concerning for RCC; c.) US  ABD 10/12/2023: 1.7 complex cystic mass RIGHT kidney; d.) MRI ABD 11/15/2023: interval increase in size to 1.6 x 1.2 x 1.0 cm   Splenomegaly    Thoracic spondylosis    Thrombocytopenia    Tubular adenoma of colon    Uses walker    Venous insufficiency    Ventral hernia    SURGICAL HISTORY: Past Surgical History:  Procedure Laterality Date   ABDOMINAL HYSTERECTOMY     CHOLECYSTECTOMY     COLONOSCOPY N/A 02/15/2024   Procedure: COLONOSCOPY;  Surgeon: Jinny Carmine, MD;  Location: [facility] ENDOSCOPY;  Service: Endoscopy;  Laterality: N/A;   COLONOSCOPY  02/15/2024   Procedure: COLONOSCOPY, WITH ARGON PLASMA COAGULATION;  Surgeon: Jinny Carmine, MD;  Location: [facility] ENDOSCOPY;  Service: Endoscopy;;   COLONOSCOPY WITH PROPOFOL  N/A 06/23/2019   Procedure: COLONOSCOPY WITH PROPOFOL ;  Surgeon: Therisa Bi, MD;  Location: [facility] ENDOSCOPY;  Service: Gastroenterology;  Laterality: N/A;   COLONOSCOPY WITH PROPOFOL  N/A 12/08/2022   Procedure: COLONOSCOPY WITH PROPOFOL ;  Surgeon: Maryruth Ole DASEN, MD;  Location: [facility] ENDOSCOPY;  Service: Endoscopy;  Laterality: N/A;   COLONOSCOPY WITH PROPOFOL  N/A 05/18/2023   Procedure: COLONOSCOPY WITH PROPOFOL ;  Surgeon: Maryruth Ole DASEN, MD;  Location: [facility] ENDOSCOPY;   Service: Endoscopy;  Laterality: N/A;   ESOPHAGOGASTRODUODENOSCOPY N/A 02/15/2024   Procedure: EGD (ESOPHAGOGASTRODUODENOSCOPY);  Surgeon: Jinny Carmine, MD;  Location: [facility] ENDOSCOPY;  Service: Endoscopy;  Laterality: N/A;   ESOPHAGOGASTRODUODENOSCOPY N/A 07/04/2024   Procedure: EGD (ESOPHAGOGASTRODUODENOSCOPY);  Surgeon: Maryruth Ole DASEN, MD;  Location: [facility] ENDOSCOPY;  Service: Endoscopy;  Laterality: N/A;   ESOPHAGOGASTRODUODENOSCOPY (EGD) WITH PROPOFOL  N/A 06/23/2019   Procedure: ESOPHAGOGASTRODUODENOSCOPY (EGD) WITH PROPOFOL ;  Surgeon: Therisa Bi, MD;  Location: [facility] ENDOSCOPY;  Service: Gastroenterology;  Laterality: N/A;   ESOPHAGOGASTRODUODENOSCOPY (EGD) WITH PROPOFOL  N/A 12/08/2022   Procedure: ESOPHAGOGASTRODUODENOSCOPY (EGD) WITH PROPOFOL ;  Surgeon: Maryruth Ole DASEN, MD;  Location: [facility] ENDOSCOPY;  Service: Endoscopy;  Laterality: N/A;  DM, OZEMPIC   ESOPHAGOGASTRODUODENOSCOPY (EGD) WITH PROPOFOL  N/A 01/12/2023   Procedure: ESOPHAGOGASTRODUODENOSCOPY (EGD) WITH PROPOFOL ;  Surgeon: Maryruth Ole DASEN, MD;  Location: [facility] ENDOSCOPY;  Service: Endoscopy;  Laterality: N/A;  DM   GIVENS CAPSULE STUDY  02/15/2024   Procedure: IMAGING PROCEDURE, GI TRACT, INTRALUMINAL, VIA CAPSULE;  Surgeon: Jinny Carmine, MD;  Location: [facility] ENDOSCOPY;  Service: Endoscopy;;   HOT HEMOSTASIS  02/15/2024   Procedure: EGD, WITH ARGON PLASMA COAGULATION;  Surgeon: Jinny Carmine, MD;  Location: [facility] ENDOSCOPY;  Service: Endoscopy;;   IR RADIOLOGIST EVAL & MGMT  10/28/2023   IR RADIOLOGIST EVAL & MGMT  01/18/2024   IR RADIOLOGIST EVAL & MGMT  08/15/2024   POLYPECTOMY  05/18/2023   Procedure: POLYPECTOMY;  Surgeon: Maryruth Ole DASEN, MD;  Location: [facility] ENDOSCOPY;  Service: Endoscopy;;   POLYPECTOMY  02/15/2024   Procedure: POLYPECTOMY, INTESTINE;  Surgeon: Jinny Carmine, MD;  Location: [facility] ENDOSCOPY;  Service: Endoscopy;;   TONSILLECTOMY     TOTAL HIP ARTHROPLASTY Right 2017   SOCIAL HISTORY: Social History    Socioeconomic History   Marital status: Divorced    Spouse name: Not on file   Number of children: 2   Years of education: Not on file   Highest education level: High school graduate  Occupational History   Not on file  Tobacco Use   Smoking status: Former    Current packs/day: 0.00    Types: Cigarettes    Quit date: 03/03/2011    Years since quitting: 13.5   Smokeless tobacco: Never  Vaping Use   Vaping status: Never Used  Substance and Sexual Activity   Alcohol use: Yes    Comment: rarely   Drug use: Not Currently   Sexual activity: Not Currently  Other Topics Concern   Not on file  Social History Narrative   Not on file   Social Drivers of Health   Financial Resource Strain: Low Risk  (05/04/2024)   Received from [facility]   Overall Financial Resource Strain (CARDIA)    Difficulty of Paying Living Expenses: Not hard at all  Food Insecurity: No Food Insecurity (05/04/2024)   Received from [facility]   Hunger Vital Sign    Within the past 12 months, you worried that your food would run out before you got the money to buy more.: Never true    Within the past 12 months, the food you bought just didn't last and you didn't have money to get more.: Never true  Transportation Needs: No Transportation Needs (05/04/2024)   Received from [facility] - Transportation    In the past 12 months, has lack of transportation kept you from medical appointments or from getting medications?: No    Lack of Transportation (Non-Medical): No  Physical Activity: Inactive (06/16/2021)   Received from [facility]   Exercise Vital Sign    On average, how many days per week do you engage in moderate to strenuous exercise (like a brisk walk)?: 0 days    On average, how many minutes do you engage in exercise at this level?: 0 min  Stress: No Stress Concern Present (06/16/2021)   Received from [facility] - Occupational Stress Questionnaire  Social Connections: Moderately Integrated (02/14/2024)   Social Connection and Isolation Panel    Frequency of Communication with Friends and Family: More than three times a week    Frequency of Social Gatherings with Friends and Family: More than three times a week    Attends Religious Services: More than 4 times per year    Active Member of [employer] or Organizations: Yes    Attends Banker Meetings: More than 4 times per year    Marital Status: Divorced  Intimate Partner Violence: Not At Risk (02/14/2024)   Humiliation, Afraid, Rape, and Kick questionnaire    Fear of Current or  Ex-Partner: No    Emotionally Abused: No    Physically Abused: No    Sexually Abused: No   FAMILY HISTORY: Family History  Problem Relation Age of Onset   Lung cancer Mother    Heart attack Father 36   Mental illness Father    Bipolar disorder Brother    Bipolar disorder Paternal Uncle    Bipolar disorder Paternal Uncle    ALLERGIES:  is allergic to lithium, penicillins, sulfa antibiotics, and benzonatate.  MEDICATIONS:  Current Outpatient Medications  Medication Sig Dispense Refill   amLODipine  (NORVASC ) 5 MG tablet Take 5 mg by mouth daily.     ARIPiprazole  (ABILIFY ) 10 MG tablet Take 1 tablet (10 mg total) by mouth daily. 90 tablet 3   escitalopram  (LEXAPRO ) 10 MG tablet TAKE ONE TABLET BY MOUTH ONE TIME DAILY 90 tablet 3   furosemide (LASIX) 20 MG tablet Take 20 mg by mouth daily as needed for fluid or edema.     glimepiride (AMARYL) 2 MG tablet Take 2 mg by mouth every morning.     HYDROcodone -acetaminophen  (NORCO) 10-325 MG tablet Take 1 tablet by mouth every 8 (eight) hours as needed for severe pain (pain score 7-10). Must last 30 days. 90 tablet 0   [START ON 10/29/2024] HYDROcodone -acetaminophen  (NORCO) 10-325 MG tablet Take 1 tablet by mouth every 8 (eight) hours as needed for severe pain (pain score  7-10). Must last 30 days. 90 tablet 0   lamoTRIgine  (LAMICTAL ) 200 MG tablet TAKE ONE TABLET BY MOUTH ONE TIME DAILY 90 tablet 3   lamoTRIgine  (LAMICTAL ) 25 MG tablet Take 1 tablet (25 mg total) by mouth daily. Take daily with 200 mg , total of 225 mg 90 tablet 1   metFORMIN (GLUCOPHAGE) 500 MG tablet Take 500 mg by mouth 2 (two) times daily with a meal.     ondansetron  (ZOFRAN ) 8 MG tablet Take 1 tablet (8 mg total) by mouth every 8 (eight) hours as needed for nausea or vomiting. 30 tablet 0   OZEMPIC, 1 MG/DOSE, 4 MG/3ML SOPN Inject 1 mg into the skin once a week. Sundays     pantoprazole  (PROTONIX ) 40 MG tablet Take 40 mg by mouth every morning.     polyethylene glycol powder (GLYCOLAX/MIRALAX) 17 GM/SCOOP powder Take 17 g by mouth daily.     spironolactone (ALDACTONE) 25 MG tablet Take 25 mg by mouth daily.     tizanidine (ZANAFLEX) 2 MG capsule Take 2 mg by mouth 3 (three) times daily as needed for muscle spasms. Takes differently     XIFAXAN  550 MG TABS tablet Take 550 mg by mouth 2 (two) times daily.     No current facility-administered medications for this visit.   Facility-Administered Medications Ordered in Other Visits  Medication Dose Route Frequency Provider Last Rate Last Admin   sodium chloride  flush (NS) 0.9 % injection 10 mL  10 mL Intracatheter Once PRN Jabron Weese R, MD         PHYSICAL EXAMINATION: Vitals:   10/02/24 1456  BP: (!) 135/59  Pulse: 68  Resp: 18  Temp: 98.3 F (36.8 C)  SpO2: 97%    Filed Weights   10/02/24 1456  Weight: 171 lb 8 oz (77.8 kg)    Physical Exam Vitals reviewed.  Constitutional:      Appearance: She is not ill-appearing.  Eyes:     General: No scleral icterus. Cardiovascular:     Rate and Rhythm: Normal rate and regular rhythm.  Pulmonary:  Effort: No respiratory distress.  Abdominal:     General: There is no distension.     Palpations: Abdomen is soft.     Tenderness: There is no abdominal tenderness. There is  no guarding.  Skin:    General: Skin is warm and dry.  Neurological:     Mental Status: She is alert and oriented to person, place, and time.  Psychiatric:        Mood and Affect: Mood normal.        Behavior: Behavior normal.      LABORATORY DATA:  I have reviewed the data as listed Lab Results  Component Value Date   WBC 6.9 10/02/2024   HGB 12.5 10/02/2024   HCT 37.0 10/02/2024   MCV 89.2 10/02/2024   PLT 96 (L) 10/02/2024   Recent Labs    01/05/24 1038 02/14/24 0955 02/14/24 1153 02/15/24 1107 04/13/24 1430  NA 133*   < > 139 138 133*  K 4.5   < > 4.2 4.6 4.5  CL 100   < > 106 110 100  CO2 24   < > 23 23 21*  GLUCOSE 225*   < > 171* 161* 321*  BUN 16   < > 19 15 32*  CREATININE 1.04*   < > 1.06* 0.95 1.32*  CALCIUM 8.8*   < > 9.0 8.9 8.6*  GFRNONAA 59*   < > 58* >60 45*  PROT 6.8  --  6.5 6.3*  --   ALBUMIN  3.7  --  3.5 3.4*  --   AST 29  --  30 45*  --   ALT 20  --  19 24  --   ALKPHOS 101  --  84 83  --   BILITOT 0.9  --  1.0 2.2*  --    < > = values in this interval not displayed.     No results found.   ASSESSMENT & PLAN:    Symptomatic anemia # [2020] History of iron  deficiency anemia secondary to chronic GI bleed/cirrhosis; GAVE [EGD in G+FEB 2024].  FEB 2024- Iron  sat 9 Ferritin 45- wnl-PCP; Hb 9-10.  s/p IV iron  x4 [JAN 2024]- MM panel-WNL.   # Today Hb  12.5- proceed with Venofer .   # Etiology: Cirrhosis/GI bleed FEB 2024- GAVE -non-bleeding-; colo-? Capsule study.  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  Hold off EGD for now-.  # Easy bruising -mild thrombocytopenia > 100-secondary cirrhosis hypersplenism-monitor for now.  #Liver cirrhosis, splenomegaly, small volume ascites.  Patient does not drink alcohol.? NAFLD-AFP. Patient will need screening ultrasounds [? [facility]]-US - 2024- March - cirrhosis;MAY 2024-MRI per GI - NO malignancy noted. Defer to  re: surveillance.   # Right kidney complex cyst [incidental MAY  2024- MRI]- s/p urology, Dr.Stoioff- MRi pending.    # weight loss sec to oezmpic [40 pounds]- stable. Low concerns for malignancy at this time.   # DISPOSITION: # cancel blood transfusion # venofer  today # venofer  in 3 weeks   # follow up in 6 weeks- APP- labs- cbc/HOLD tube;Venofer - possible D-2 1 unit PRBC-Dr.B    All questions were answered. The patient knows to call the clinic with any problems, questions or concerns.   Cindy JONELLE Joe, MD 10/02/2024

## 2024-10-03 ENCOUNTER — Inpatient Hospital Stay

## 2024-10-04 ENCOUNTER — Inpatient Hospital Stay

## 2024-10-11 ENCOUNTER — Telehealth: Admitting: Psychiatry

## 2024-10-23 ENCOUNTER — Inpatient Hospital Stay

## 2024-10-23 VITALS — BP 121/49 | HR 66 | Temp 97.4°F | Resp 19

## 2024-10-23 DIAGNOSIS — D500 Iron deficiency anemia secondary to blood loss (chronic): Secondary | ICD-10-CM | POA: Diagnosis not present

## 2024-10-23 MED ORDER — IRON SUCROSE 20 MG/ML IV SOLN
200.0000 mg | Freq: Once | INTRAVENOUS | Status: AC
  Administered 2024-10-23: 200 mg via INTRAVENOUS

## 2024-10-23 MED ORDER — SODIUM CHLORIDE 0.9% FLUSH
10.0000 mL | Freq: Once | INTRAVENOUS | Status: AC | PRN
  Administered 2024-10-23: 10 mL
  Filled 2024-10-23: qty 10

## 2024-11-13 ENCOUNTER — Inpatient Hospital Stay

## 2024-11-13 ENCOUNTER — Inpatient Hospital Stay: Admitting: Internal Medicine

## 2024-11-13 ENCOUNTER — Inpatient Hospital Stay: Attending: Internal Medicine

## 2024-11-13 ENCOUNTER — Encounter: Payer: Self-pay | Admitting: Nurse Practitioner

## 2024-11-13 ENCOUNTER — Inpatient Hospital Stay: Admitting: Nurse Practitioner

## 2024-11-13 VITALS — BP 139/53 | HR 60 | Temp 96.3°F | Resp 18

## 2024-11-13 VITALS — BP 138/55 | HR 61 | Temp 97.8°F | Resp 16 | Wt 173.5 lb

## 2024-11-13 DIAGNOSIS — D649 Anemia, unspecified: Secondary | ICD-10-CM

## 2024-11-13 DIAGNOSIS — K922 Gastrointestinal hemorrhage, unspecified: Secondary | ICD-10-CM | POA: Insufficient documentation

## 2024-11-13 DIAGNOSIS — D500 Iron deficiency anemia secondary to blood loss (chronic): Secondary | ICD-10-CM

## 2024-11-13 LAB — CBC WITH DIFFERENTIAL (CANCER CENTER ONLY)
Abs Immature Granulocytes: 0.05 K/uL (ref 0.00–0.07)
Basophils Absolute: 0 K/uL (ref 0.0–0.1)
Basophils Relative: 1 %
Eosinophils Absolute: 0.1 K/uL (ref 0.0–0.5)
Eosinophils Relative: 1 %
HCT: 34.8 % — ABNORMAL LOW (ref 36.0–46.0)
Hemoglobin: 11.6 g/dL — ABNORMAL LOW (ref 12.0–15.0)
Immature Granulocytes: 1 %
Lymphocytes Relative: 17 %
Lymphs Abs: 0.9 K/uL (ref 0.7–4.0)
MCH: 31 pg (ref 26.0–34.0)
MCHC: 33.3 g/dL (ref 30.0–36.0)
MCV: 93 fL (ref 80.0–100.0)
Monocytes Absolute: 0.5 K/uL (ref 0.1–1.0)
Monocytes Relative: 10 %
Neutro Abs: 3.9 K/uL (ref 1.7–7.7)
Neutrophils Relative %: 70 %
Platelet Count: 89 K/uL — ABNORMAL LOW (ref 150–400)
RBC: 3.74 MIL/uL — ABNORMAL LOW (ref 3.87–5.11)
RDW: 13.8 % (ref 11.5–15.5)
WBC Count: 5.5 K/uL (ref 4.0–10.5)
nRBC: 0 % (ref 0.0–0.2)

## 2024-11-13 LAB — SAMPLE TO BLOOD BANK

## 2024-11-13 MED ORDER — SODIUM CHLORIDE 0.9% FLUSH
10.0000 mL | Freq: Once | INTRAVENOUS | Status: AC | PRN
  Administered 2024-11-13: 10 mL
  Filled 2024-11-13: qty 10

## 2024-11-13 MED ORDER — IRON SUCROSE 20 MG/ML IV SOLN
200.0000 mg | Freq: Once | INTRAVENOUS | Status: AC
  Administered 2024-11-13: 200 mg via INTRAVENOUS
  Filled 2024-11-13: qty 10

## 2024-11-13 NOTE — Progress Notes (Signed)
 [facility] CONSULT NOTE  Patient Care Team: Cleotilde Oneil FALCON, MD as PCP - General (Internal Medicine) Rennie Cindy SAUNDERS, MD as Consulting Physician (Oncology) Maryruth Ole DASEN, MD as Consulting Physician (Gastroenterology)  CHIEF COMPLAINTS/PURPOSE OF CONSULTATION: Anemia/thrombocytopenia   HEMATOLOGY HISTORY  06/21/2019-06/24/2019 due to severe anemia; hemoglobin of 5.7 upon admission; patient received 2 units of blood transfusion, 3 doses of IV iron . EGD and colonoscopy were performed during the hospitalization EGD and colonoscopy on 06/23/2019. [Dr.Anna; [facility]] EGD showed no abnormalities and the colonoscopy showed 3 sessile diminutive polyps which were resected completely.  Arteries of the duodenum were normal.  3 colon polyps resected were serrated polyp x2 and a tubular adenoma x1. No active source of bleeding was noted.    # Cirrhosis NAFLD- [? [facility]]  HISTORY OF PRESENTING ILLNESS: ambulating with rolling walker [arthritis].   Margaret [name] 69 y.o. 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   Review of Systems  Constitutional:  Positive for malaise/fatigue. Negative for chills, diaphoresis, fever and weight loss.  HENT:  Negative for nosebleeds and sore throat.   Eyes:  Negative for double vision.  Respiratory:  Negative for cough, hemoptysis, sputum production, shortness of breath and wheezing.   Cardiovascular:  Negative for chest pain, palpitations, orthopnea and leg swelling.  Gastrointestinal:  Negative for abdominal pain, blood in stool, constipation, diarrhea, heartburn, melena, nausea and vomiting.  Genitourinary:  Negative for dysuria, frequency and urgency.  Musculoskeletal:  Negative for back pain, falls and joint pain.  Skin:  Negative for itching and rash.  Neurological:  Negative for dizziness, tingling,  focal weakness, weakness and headaches.  Endo/Heme/Allergies:  Does not bruise/bleed easily.  Psychiatric/Behavioral:  Negative for depression. The patient is not nervous/anxious and does not have insomnia.    MEDICAL HISTORY:  Past Medical History:  Diagnosis Date   (HFpEF) heart failure with preserved ejection fraction (HCC)    a.) TTE 12/15/2022: EF >55%, mild LVH, G1DD, mild LAE, mild MAC, triv AR/MR/TR, mild TR, RVSP 30.7, mod AS (MPG 20)   Anxiety    Aortic atherosclerosis    Aortic stenosis    a.) TTE 12/15/2022: moderate AS (MPG 20 mmHg; AVA = 1.7 cm2)   Bilateral carotid artery disease    a.) doppler 12/22/2020: <50% BICA   Bilateral renal cysts    Bipolar 1 disorder (HCC)    CAD (coronary artery disease) 11/04/2022   a.) CT chest 11/04/2022: 3v CAD   Cardiac murmur    CKD (chronic kidney disease), stage III (HCC)    DDD (degenerative disc disease), lumbar    Depression    DM (diabetes mellitus), type 2 (HCC)    Former smoker    Frequent falls    GERD (gastroesophageal reflux disease)    H/O: GI bleed    Heart murmur    History of blood transfusion    History of MRSA infection 2017   HLD (hyperlipidemia)    Hypertension    IDA (iron  deficiency anemia)    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[facility])    Multiple pulmonary nodules    Nephrolithiasis    Occasional tremors    Osteoarthritis    Osteoporosis    Portal hypertensive gastropathy (HCC)    Portal venous hypertension (HCC)    Renal mass, right 03/30/2023   a.) MRI ABD 03/30/2023: 1.2 x 1.2 cm complex cystic renal  cortical mass in the posterior mid to low RIGHT kidney (Bosniak 3); b.) MRI ABD 08/03/2023: interval increase in size to 1.4 x 1.3 cm -> concerning for RCC; c.) US  ABD 10/12/2023: 1.7 complex cystic mass RIGHT kidney; d.) MRI ABD 11/15/2023: interval increase in size to 1.6 x 1.2 x 1.0 cm   Splenomegaly    Thoracic spondylosis    Thrombocytopenia    Tubular adenoma of colon    Uses walker    Venous  insufficiency    Ventral hernia    SURGICAL HISTORY: Past Surgical History:  Procedure Laterality Date   ABDOMINAL HYSTERECTOMY     CHOLECYSTECTOMY     COLONOSCOPY N/A 02/15/2024   Procedure: COLONOSCOPY;  Surgeon: Jinny Carmine, MD;  Location: [facility] ENDOSCOPY;  Service: Endoscopy;  Laterality: N/A;   COLONOSCOPY  02/15/2024   Procedure: COLONOSCOPY, WITH ARGON PLASMA COAGULATION;  Surgeon: Jinny Carmine, MD;  Location: [facility] ENDOSCOPY;  Service: Endoscopy;;   COLONOSCOPY WITH PROPOFOL  N/A 06/23/2019   Procedure: COLONOSCOPY WITH PROPOFOL ;  Surgeon: Therisa Bi, MD;  Location: [facility] ENDOSCOPY;  Service: Gastroenterology;  Laterality: N/A;   COLONOSCOPY WITH PROPOFOL  N/A 12/08/2022   Procedure: COLONOSCOPY WITH PROPOFOL ;  Surgeon: Maryruth Ole DASEN, MD;  Location: [facility] ENDOSCOPY;  Service: Endoscopy;  Laterality: N/A;   COLONOSCOPY WITH PROPOFOL  N/A 05/18/2023   Procedure: COLONOSCOPY WITH PROPOFOL ;  Surgeon: Maryruth Ole DASEN, MD;  Location: [facility] ENDOSCOPY;  Service: Endoscopy;  Laterality: N/A;   ESOPHAGOGASTRODUODENOSCOPY N/A 02/15/2024   Procedure: EGD (ESOPHAGOGASTRODUODENOSCOPY);  Surgeon: Jinny Carmine, MD;  Location: [facility] ENDOSCOPY;  Service: Endoscopy;  Laterality: N/A;   ESOPHAGOGASTRODUODENOSCOPY N/A 07/04/2024   Procedure: EGD (ESOPHAGOGASTRODUODENOSCOPY);  Surgeon: Maryruth Ole DASEN, MD;  Location: [facility] ENDOSCOPY;  Service: Endoscopy;  Laterality: N/A;   ESOPHAGOGASTRODUODENOSCOPY (EGD) WITH PROPOFOL  N/A 06/23/2019   Procedure: ESOPHAGOGASTRODUODENOSCOPY (EGD) WITH PROPOFOL ;  Surgeon: Therisa Bi, MD;  Location: [facility] ENDOSCOPY;  Service: Gastroenterology;  Laterality: N/A;   ESOPHAGOGASTRODUODENOSCOPY (EGD) WITH PROPOFOL  N/A 12/08/2022   Procedure: ESOPHAGOGASTRODUODENOSCOPY (EGD) WITH PROPOFOL ;  Surgeon: Maryruth Ole DASEN, MD;  Location: [facility] ENDOSCOPY;  Service: Endoscopy;  Laterality: N/A;  DM, OZEMPIC   ESOPHAGOGASTRODUODENOSCOPY (EGD) WITH PROPOFOL  N/A 01/12/2023   Procedure:  ESOPHAGOGASTRODUODENOSCOPY (EGD) WITH PROPOFOL ;  Surgeon: Maryruth Ole DASEN, MD;  Location: [facility] ENDOSCOPY;  Service: Endoscopy;  Laterality: N/A;  DM   GIVENS CAPSULE STUDY  02/15/2024   Procedure: IMAGING PROCEDURE, GI TRACT, INTRALUMINAL, VIA CAPSULE;  Surgeon: Jinny Carmine, MD;  Location: [facility] ENDOSCOPY;  Service: Endoscopy;;   HOT HEMOSTASIS  02/15/2024   Procedure: EGD, WITH ARGON PLASMA COAGULATION;  Surgeon: Jinny Carmine, MD;  Location: [facility] ENDOSCOPY;  Service: Endoscopy;;   IR RADIOLOGIST EVAL & MGMT  10/28/2023   IR RADIOLOGIST EVAL & MGMT  01/18/2024   IR RADIOLOGIST EVAL & MGMT  08/15/2024   POLYPECTOMY  05/18/2023   Procedure: POLYPECTOMY;  Surgeon: Maryruth Ole DASEN, MD;  Location: [facility] ENDOSCOPY;  Service: Endoscopy;;   POLYPECTOMY  02/15/2024   Procedure: POLYPECTOMY, INTESTINE;  Surgeon: Jinny Carmine, MD;  Location: [facility] ENDOSCOPY;  Service: Endoscopy;;   TONSILLECTOMY     TOTAL HIP ARTHROPLASTY Right 2017   SOCIAL HISTORY: Social History   Socioeconomic History   Marital status: Divorced    Spouse name: Not on file   Number of children: 2   Years of education: Not on file   Highest education level: High school graduate  Occupational History   Not on file  Tobacco Use   Smoking status: Former    Current packs/day:  0.00    Types: Cigarettes    Quit date: 03/03/2011    Years since quitting: 13.7   Smokeless tobacco: Never  Vaping Use   Vaping status: Never Used  Substance and Sexual Activity   Alcohol use: Yes    Comment: rarely   Drug use: Not Currently   Sexual activity: Not Currently  Other Topics Concern   Not on file  Social History Narrative   Not on file   Social Drivers of Health   Tobacco Use: Medium Risk (11/13/2024)   Patient History    Smoking Tobacco Use: Former    Smokeless Tobacco Use: Never    Passive Exposure: Not on Actuary Strain: Low Risk  (05/04/2024)   Received from [facility]   Overall  Financial Resource Strain (CARDIA)    Difficulty of Paying Living Expenses: Not hard at all  Food Insecurity: No Food Insecurity (05/04/2024)   Received from [facility]   Epic    Within the past 12 months, you worried that your food would run out before you got the money to buy more.: Never true    Within the past 12 months, the food you bought just didn't last and you didn't have money to get more.: Never true  Transportation Needs: No Transportation Needs (05/04/2024)   Received from [facility] - Transportation    In the past 12 months, has lack of transportation kept you from medical appointments or from getting medications?: No    Lack of Transportation (Non-Medical): No  Physical Activity: Not on file  Stress: Not on file  Social Connections: Moderately Integrated (02/14/2024)   Social Connection and Isolation Panel    Frequency of Communication with Friends and Family: More than three times a week    Frequency of Social Gatherings with Friends and Family: More than three times a week    Attends Religious Services: More than 4 times per year    Active Member of [employer] or Organizations: Yes    Attends Engineer, Structural: More than 4 times per year    Marital Status: Divorced  Intimate Partner Violence: Not At Risk (02/14/2024)   Humiliation, Afraid, Rape, and Kick questionnaire    Fear of Current or Ex-Partner: No    Emotionally Abused: No    Physically Abused: No    Sexually Abused: No  Depression (PHQ2-9): Low Risk (11/13/2024)   Depression (PHQ2-9)    PHQ-2 Score: 0  Recent Concern: Depression (PHQ2-9) - Medium Risk (08/30/2024)   Depression (PHQ2-9)    PHQ-2 Score: 8  Alcohol Screen: Not on file  Housing: Low Risk  (05/04/2024)   Received from [facility]   Epic    In the last 12 months, was there a time when you were not able to pay the mortgage or rent on time?: No    In the past 12 months, how many  times have you moved where you were living?: 0    At any time in the past 12 months, were you homeless or living in a shelter (including now)?: No  Utilities: Not At Risk (05/04/2024)   Received from [facility]   Epic    In the past 12 months has the electric, gas, oil, or water  company threatened to shut off services in your home?: No  Health Literacy: Not on file   FAMILY HISTORY: Family History  Problem Relation Age of Onset  Lung cancer Mother    Heart attack Father 71   Mental illness Father    Bipolar disorder Brother    Bipolar disorder Paternal Uncle    Bipolar disorder Paternal Uncle    ALLERGIES:  is allergic to lithium, penicillins, sulfa antibiotics, and benzonatate.  MEDICATIONS:  Current Outpatient Medications  Medication Sig Dispense Refill   amLODipine  (NORVASC ) 5 MG tablet Take 5 mg by mouth daily.     ARIPiprazole  (ABILIFY ) 10 MG tablet Take 1 tablet (10 mg total) by mouth daily. 90 tablet 3   escitalopram  (LEXAPRO ) 10 MG tablet TAKE ONE TABLET BY MOUTH ONE TIME DAILY 90 tablet 3   furosemide (LASIX) 20 MG tablet Take 20 mg by mouth daily as needed for fluid or edema.     glimepiride (AMARYL) 2 MG tablet Take 2 mg by mouth every morning.     HYDROcodone -acetaminophen  (NORCO) 10-325 MG tablet Take 1 tablet by mouth every 8 (eight) hours as needed for severe pain (pain score 7-10). Must last 30 days. 90 tablet 0   lamoTRIgine  (LAMICTAL ) 200 MG tablet TAKE ONE TABLET BY MOUTH ONE TIME DAILY 90 tablet 3   lamoTRIgine  (LAMICTAL ) 25 MG tablet Take 1 tablet (25 mg total) by mouth daily. Take daily with 200 mg , total of 225 mg 90 tablet 1   metFORMIN (GLUCOPHAGE) 500 MG tablet Take 500 mg by mouth 2 (two) times daily with a meal.     ondansetron  (ZOFRAN ) 8 MG tablet Take 1 tablet (8 mg total) by mouth every 8 (eight) hours as needed for nausea or vomiting. 30 tablet 0   OZEMPIC, 1 MG/DOSE, 4 MG/3ML SOPN Inject 1 mg into the skin once a week. Sundays      pantoprazole  (PROTONIX ) 40 MG tablet Take 40 mg by mouth every morning.     polyethylene glycol powder (GLYCOLAX/MIRALAX) 17 GM/SCOOP powder Take 17 g by mouth daily.     spironolactone (ALDACTONE) 25 MG tablet Take 25 mg by mouth daily.     XIFAXAN  550 MG TABS tablet Take 550 mg by mouth 2 (two) times daily.     tizanidine (ZANAFLEX) 2 MG capsule Take 2 mg by mouth 3 (three) times daily as needed for muscle spasms. Takes differently (Patient not taking: Reported on 11/13/2024)     No current facility-administered medications for this visit.   Facility-Administered Medications Ordered in Other Visits  Medication Dose Route Frequency Provider Last Rate Last Admin   sodium chloride  flush (NS) 0.9 % injection 10 mL  10 mL Intracatheter Once PRN Brahmanday, Govinda R, MD         PHYSICAL EXAMINATION: Vitals:   11/13/24 1017  BP: (!) 138/55  Pulse: 61  Resp: 16  Temp: 97.8 F (36.6 C)  SpO2: 96%   Filed Weights   11/13/24 1017  Weight: 173 lb 8 oz (78.7 kg)    Physical Exam Vitals reviewed.  Constitutional:      Appearance: She is not ill-appearing.  Eyes:     General: No scleral icterus. Cardiovascular:     Rate and Rhythm: Normal rate and regular rhythm.  Pulmonary:     Effort: No respiratory distress.  Abdominal:     General: There is no distension.     Palpations: Abdomen is soft.     Tenderness: There is no abdominal tenderness. There is no guarding.  Skin:    General: Skin is warm and dry.  Neurological:     Mental Status: She is alert and  oriented to person, place, and time.  Psychiatric:        Mood and Affect: Mood normal.        Behavior: Behavior normal.     LABORATORY DATA:  I have reviewed the data as listed Lab Results  Component Value Date   WBC 5.5 11/13/2024   HGB 11.6 (L) 11/13/2024   HCT 34.8 (L) 11/13/2024   MCV 93.0 11/13/2024   PLT 89 (L) 11/13/2024   Recent Labs    02/14/24 1153 02/15/24 1107 04/13/24 1430 10/02/24 1502  NA 139 138  133* 132*  K 4.2 4.6 4.5 4.2  CL 106 110 100 98  CO2 23 23 21* 23  GLUCOSE 171* 161* 321* 333*  BUN 19 15 32* 32*  CREATININE 1.06* 0.95 1.32* 1.02*  CALCIUM 9.0 8.9 8.6* 9.0  GFRNONAA 58* >60 45* >60  PROT 6.5 6.3*  --  7.1  ALBUMIN  3.5 3.4*  --  3.8  AST 30 45*  --  38  ALT 19 24  --  37  ALKPHOS 84 83  --  122  BILITOT 1.0 2.2*  --  1.6*  Iron /TIBC/Ferritin/ %Sat    Component Value Date/Time   IRON  127 10/02/2024 1502   IRON  37 12/06/2019 1507   TIBC 409 10/02/2024 1502   TIBC 428 12/06/2019 1507   FERRITIN 232 10/02/2024 1502   FERRITIN 45 12/06/2019 1507   IRONPCTSAT 31 10/02/2024 1502   IRONPCTSAT 9 (LL) 12/06/2019 1507   No results found.   ASSESSMENT & PLAN:   Symptomatic anemia # [2020] History of iron  deficiency anemia secondary to chronic GI bleed/cirrhosis; GAVE [EGD in G+FEB 2024].  FEB 2024- Iron  sat 9 Ferritin 45- wnl-PCP; Hb 9-10.  s/p IV iron  x4 [JAN 2024]- MM panel-WNL.    # Today Hb  11.7. Hold transfusion. Proceed with venofer .     # Etiology: Cirrhosis/GI bleed FEB 2024- GAVE non-bleeding; colo-? Capsule study.  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  Hold off EGD for now.   # Easy bruising -mild thrombocytopenia. Plt 89. Secondary cirrhosis hypersplenism. Monitor.    # Liver cirrhosis, splenomegaly, small volume ascites.  Patient does not drink alcohol.? NAFLD. Patient will need screening ultrasounds [? [facility]]-US - 2024- March - cirrhosis; MAY 2024-MRI per GI - NO malignancy noted. Defer to GI re: surveillance. Last AFP 05/19/24 - 1.9. MR Abdomen W WO Contrast (Aug/2025- [facility] [place]/GI)- no evidence of malignancy.    # Right kidney complex cyst [incidental MAY 2024- MRI]- s/p urology, Dr.Stoioff- August 2025- s/p ablation.    # Weight loss sec to oezmpic [40 pounds]- stable. Low concerns for malignancy at this time.    DISPOSITION: # Cancel blood transfusion # venofer  today # venofer  in 3 weeks   # follow up in 6 weeks- labs  (cbc, hold tube), Dr Rennie, +/- venofer .  # D2 possible 1 unit pRBCs- la  No problem-specific Assessment & Plan notes found for this encounter.  All questions were answered. The patient knows to call the clinic with any problems, questions or concerns.  Tinnie KANDICE Dawn, NP 11/13/2024

## 2024-11-13 NOTE — Progress Notes (Signed)
 Pt in for follow up, reports being more tired and fatigued.

## 2024-11-14 ENCOUNTER — Inpatient Hospital Stay

## 2024-11-14 ENCOUNTER — Telehealth: Payer: Self-pay | Admitting: Internal Medicine

## 2024-11-14 NOTE — Telephone Encounter (Signed)
 Pt called to change appt time for 2/2 appt - can only do morning appts - changed appt time w/pt - pt confirmed date/time - LH

## 2024-11-15 ENCOUNTER — Encounter: Payer: Self-pay | Admitting: Psychiatry

## 2024-11-15 ENCOUNTER — Telehealth (INDEPENDENT_AMBULATORY_CARE_PROVIDER_SITE_OTHER): Admitting: Psychiatry

## 2024-11-15 DIAGNOSIS — F418 Other specified anxiety disorders: Secondary | ICD-10-CM

## 2024-11-15 DIAGNOSIS — F3174 Bipolar disorder, in full remission, most recent episode manic: Secondary | ICD-10-CM

## 2024-11-15 MED ORDER — HYDROXYZINE PAMOATE 25 MG PO CAPS: 25.0000 mg | ORAL_CAPSULE | Freq: Two times a day (BID) | ORAL | 0 refills | Status: AC | PRN

## 2024-11-15 NOTE — Patient Instructions (Signed)
 Hydroxyzine Capsules or Tablets What is this medication? HYDROXYZINE (hye DROX i zeen) treats the symptoms of allergies and allergic reactions. It may also be used to treat anxiety or cause drowsiness before a procedure. It works by blocking histamine, a substance released by the body during an allergic reaction. It belongs to a group of medications called antihistamines. This medicine may be used for other purposes; ask your health care provider or pharmacist if you have questions. COMMON BRAND NAME(S): ANX, Atarax, Rezine, Vistaril What should I tell my care team before I take this medication? They need to know if you have any of these conditions: Glaucoma Heart disease Irregular heartbeat or rhythm Kidney disease Liver disease Lung or breathing disease, such as asthma Stomach or intestine problems Thyroid disease Trouble passing urine An unusual or allergic reaction to hydroxyzine, other medications, foods, dyes or preservatives Pregnant or trying to get pregnant Breastfeeding How should I use this medication? Take this medication by mouth with a full glass of water. Take it as directed on the prescription label at the same time every day. You can take it with or without food. If it upsets your stomach, take it with food. Talk to your care team about the use of this medication in children. While it may be prescribed for children as young as 6 years for selected conditions, precautions do apply. People 65 years and older may have a stronger reaction and need a smaller dose. Overdosage: If you think you have taken too much of this medicine contact a poison control center or emergency room at once. NOTE: This medicine is only for you. Do not share this medicine with others. What if I miss a dose? If you miss a dose, take it as soon as you can. If it is almost time for your next dose, take only that dose. Do not take double or extra doses. What may interact with this medication? Do not  take this medication with any of the following: Cisapride Dronedarone Pimozide Thioridazine This medication may also interact with the following: Alcohol Antihistamines for allergy, cough, and cold Atropine Barbiturate medications for sleep or seizures, such as phenobarbital Certain antibiotics, such as erythromycin or clarithromycin Certain medications for anxiety or sleep Certain medications for bladder problems, such as oxybutynin or tolterodine Certain medications for irregular heartbeat Certain medications for mental health conditions Certain medications for Parkinson disease, such as benztropine, trihexyphenidyl Certain medications for seizures, such as phenobarbital or primidone Certain medications for stomach problems, such as dicyclomine or hyoscyamine Certain medications for travel sickness, such as scopolamine Ipratropium Opioid medications for pain Other medications that cause heart rhythm changes, such as dofetilide This list may not describe all possible interactions. Give your health care provider a list of all the medicines, herbs, non-prescription drugs, or dietary supplements you use. Also tell them if you smoke, drink alcohol, or use illegal drugs. Some items may interact with your medicine. What should I watch for while using this medication? Visit your care team for regular checks on your progress. Tell your care team if your symptoms do not start to get better or if they get worse. This medication may affect your coordination, reaction time, or judgment. Do not drive or operate machinery until you know how this medication affects you. Sit up or stand slowly to reduce the risk of dizzy or fainting spells. Drinking alcohol with this medication can increase the risk of these side effects. Your mouth may get dry. Chewing sugarless gum or sucking hard candy  and drinking plenty of water may help. Contact your care team if the problem does not go away or is severe. This  medication may cause dry eyes and blurred vision. If you wear contact lenses, you may feel some discomfort. Lubricating eye drops may help. See your care team if the problem does not go away or is severe. If you are receiving skin tests for allergies, tell your care team you are taking this medication. What side effects may I notice from receiving this medication? Side effects that you should report to your care team as soon as possible: Allergic reactions--skin rash, itching, hives, swelling of the face, lips, tongue, or throat Heart rhythm changes--fast or irregular heartbeat, dizziness, feeling faint or lightheaded, chest pain, trouble breathing Side effects that usually do not require medical attention (report to your care team if they continue or are bothersome): Confusion Drowsiness Dry mouth Hallucinations Headache This list may not describe all possible side effects. Call your doctor for medical advice about side effects. You may report side effects to FDA at 1-800-FDA-1088. Where should I keep my medication? Keep out of the reach of children and pets. Store at room temperature between 15 and 30 degrees C (59 and 86 degrees F). Keep container tightly closed. Throw away any unused medication after the expiration date. NOTE: This sheet is a summary. It may not cover all possible information. If you have questions about this medicine, talk to your doctor, pharmacist, or health care provider.  2024 Elsevier/Gold Standard (2022-05-29 00:00:00)

## 2024-11-15 NOTE — Progress Notes (Signed)
 Virtual Visit via Video Note  I connected with Margaret [name] on 11/15/2024 at 11:00 AM EST by a video enabled telemedicine application and verified that I am speaking with the correct person using two identifiers.  Location Provider Location : [facility] Patient Location : Home  Participants: Patient , Provider    I discussed the limitations of evaluation and management by telemedicine and the availability of in person appointments. The patient expressed understanding and agreed to proceed.   I discussed the assessment and treatment plan with the patient. The patient was provided an opportunity to ask questions and all were answered. The patient agreed with the plan and demonstrated an understanding of the instructions.   The patient was advised to call back or seek an in-person evaluation if the symptoms worsen or if the condition fails to improve as anticipated.  BH MD OP Progress Note  11/15/2024 11:25 AM Margaret [name]  MRN:  [id number]  Chief Complaint:  Chief Complaint  Patient presents with   Follow-up   Anxiety   Depression   Manic Behavior   Medication Refill   Discussed the use of AI scribe software for clinical note transcription with the patient, who gave verbal consent to proceed.  History of Present Illness Margaret [name] is a 69 year old Caucasian female, lives in [place], has a history of bipolar disorder, other specified anxiety disorder, hyperlipidemia, liver cirrhosis, right kidney complex cyst, thrombocytopenia, chronic pain was evaluated by telemedicine today for a follow-up appointment.  Since her last visit in October, she reports significant improvement in mood symptoms following a recent manic episode. She describes feeling much better and notes that her depressive symptoms have resolved. She reports that her current medication regimen includes Lamictal  225 mg daily, Abilify  10 mg daily, and Lexapro  10 mg daily (reduced from a previous higher  dose). She also takes Cogentin  0.5 mg as needed for tremors .  She states that she has not experienced significant depression or anxiety symptoms since November. A recent episode of acute anxiety occurred when she had a flat tire on the highway, during which she experienced intense shaking and panic. Her son assisted her during this event, and she felt fine after the situation resolved. She notes that she does not experience daily anxiety and considers such episodes situational.  She reports sleeping well and does not describe any current sleep disturbances except for the need to get up to void. She denies any current or past thoughts of hurting herself or others.  She denies any homicidality or perceptual disturbances.  She lives alone, attends church regularly, and has a son who provides support.    Visit Diagnosis:    ICD-10-CM   1. Bipolar disorder, in full remission, most recent episode manic  F31.74    Type I    2. Other specified anxiety disorders  F41.8 hydrOXYzine  (VISTARIL ) 25 MG capsule   With limited symptom attacks      Past Psychiatric History: I have reviewed past psychiatric history from progress note on 08/21/2019.  Past trials of Depakote, lithium, risperidone, Xanax   Past Medical History:  Past Medical History:  Diagnosis Date   (HFpEF) heart failure with preserved ejection fraction (HCC)    a.) TTE 12/15/2022: EF >55%, mild LVH, G1DD, mild LAE, mild MAC, triv AR/MR/TR, mild TR, RVSP 30.7, mod AS (MPG 20)   Anxiety    Aortic atherosclerosis    Aortic stenosis    a.) TTE 12/15/2022: moderate AS (MPG 20 mmHg; AVA = 1.7  cm2)   Bilateral carotid artery disease    a.) doppler 12/22/2020: <50% BICA   Bilateral renal cysts    Bipolar 1 disorder (HCC)    CAD (coronary artery disease) 11/04/2022   a.) CT chest 11/04/2022: 3v CAD   Cardiac murmur    CKD (chronic kidney disease), stage III (HCC)    DDD (degenerative disc disease), lumbar    Depression    DM (diabetes  mellitus), type 2 (HCC)    Former smoker    Frequent falls    GERD (gastroesophageal reflux disease)    H/O: GI bleed    Heart murmur    History of blood transfusion    History of MRSA infection 2017   HLD (hyperlipidemia)    Hypertension    IDA (iron  deficiency anemia)    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H [facility])    Multiple pulmonary nodules    Nephrolithiasis    Occasional tremors    Osteoarthritis    Osteoporosis    Portal hypertensive gastropathy (HCC)    Portal venous hypertension (HCC)    Renal mass, right 03/30/2023   a.) MRI ABD 03/30/2023: 1.2 x 1.2 cm complex cystic renal cortical mass in the posterior mid to low RIGHT kidney (Bosniak 3); b.) MRI ABD 08/03/2023: interval increase in size to 1.4 x 1.3 cm -> concerning for RCC; c.) US  ABD 10/12/2023: 1.7 complex cystic mass RIGHT kidney; d.) MRI ABD 11/15/2023: interval increase in size to 1.6 x 1.2 x 1.0 cm   Splenomegaly    Thoracic spondylosis    Thrombocytopenia    Tubular adenoma of colon    Uses walker    Venous insufficiency    Ventral hernia     Past Surgical History:  Procedure Laterality Date   ABDOMINAL HYSTERECTOMY     CHOLECYSTECTOMY     COLONOSCOPY N/A 02/15/2024   Procedure: COLONOSCOPY;  Surgeon: Jinny Carmine, MD;  Location: [facility] ENDOSCOPY;  Service: Endoscopy;  Laterality: N/A;   COLONOSCOPY  02/15/2024   Procedure: COLONOSCOPY, WITH ARGON PLASMA COAGULATION;  Surgeon: Jinny Carmine, MD;  Location: [facility] ENDOSCOPY;  Service: Endoscopy;;   COLONOSCOPY WITH PROPOFOL  N/A 06/23/2019   Procedure: COLONOSCOPY WITH PROPOFOL ;  Surgeon: Therisa Bi, MD;  Location: [facility] ENDOSCOPY;  Service: Gastroenterology;  Laterality: N/A;   COLONOSCOPY WITH PROPOFOL  N/A 12/08/2022   Procedure: COLONOSCOPY WITH PROPOFOL ;  Surgeon: Maryruth Ole DASEN, MD;  Location: [facility] ENDOSCOPY;  Service: Endoscopy;  Laterality: N/A;   COLONOSCOPY WITH PROPOFOL  N/A 05/18/2023   Procedure: COLONOSCOPY WITH PROPOFOL ;  Surgeon: Maryruth Ole DASEN, MD;  Location: [facility] ENDOSCOPY;  Service: Endoscopy;  Laterality: N/A;   ESOPHAGOGASTRODUODENOSCOPY N/A 02/15/2024   Procedure: EGD (ESOPHAGOGASTRODUODENOSCOPY);  Surgeon: Jinny Carmine, MD;  Location[facility] ENDOSCOPY;  Service: Endoscopy;  Laterality: N/A;   ESOPHAGOGASTRODUODENOSCOPY N/A 07/04/2024   Procedure: EGD (ESOPHAGOGASTRODUODENOSCOPY);  Surgeon: Maryruth Ole DASEN, MD;  Location: [facility] ENDOSCOPY;  Service: Endoscopy;  Laterality: N/A;   ESOPHAGOGASTRODUODENOSCOPY (EGD) WITH PROPOFOL  N/A 06/23/2019   Procedure: ESOPHAGOGASTRODUODENOSCOPY (EGD) WITH PROPOFOL ;  Surgeon: Therisa Bi, MD;  Location: [facility] ENDOSCOPY;  Service: Gastroenterology;  Laterality: N/A;   ESOPHAGOGASTRODUODENOSCOPY (EGD) WITH PROPOFOL  N/A 12/08/2022   Procedure: ESOPHAGOGASTRODUODENOSCOPY (EGD) WITH PROPOFOL ;  Surgeon: Maryruth Ole DASEN, MD;  Location: [facility] ENDOSCOPY;  Service: Endoscopy;  Laterality: N/A;  DM, OZEMPIC   ESOPHAGOGASTRODUODENOSCOPY (EGD) WITH PROPOFOL  N/A 01/12/2023   Procedure: ESOPHAGOGASTRODUODENOSCOPY (EGD) WITH PROPOFOL ;  Surgeon: Maryruth Ole DASEN, MD;  Location: [facility] ENDOSCOPY;  Service: Endoscopy;  Laterality: N/A;  DM   GIVENS  CAPSULE STUDY  02/15/2024   Procedure: IMAGING PROCEDURE, GI TRACT, INTRALUMINAL, VIA CAPSULE;  Surgeon: Jinny Carmine, MD;  Location: [facility] ENDOSCOPY;  Service: Endoscopy;;   HOT HEMOSTASIS  02/15/2024   Procedure: EGD, WITH ARGON PLASMA COAGULATION;  Surgeon: Jinny Carmine, MD;  Location: [facility] ENDOSCOPY;  Service: Endoscopy;;   IR RADIOLOGIST EVAL & MGMT  10/28/2023   IR RADIOLOGIST EVAL & MGMT  01/18/2024   IR RADIOLOGIST EVAL & MGMT  08/15/2024   POLYPECTOMY  05/18/2023   Procedure: POLYPECTOMY;  Surgeon: Maryruth Ole DASEN, MD;  Location: [facility] ENDOSCOPY;  Service: Endoscopy;;   POLYPECTOMY  02/15/2024   Procedure: POLYPECTOMY, INTESTINE;  Surgeon: Jinny Carmine, MD;  Location: [facility] ENDOSCOPY;  Service: Endoscopy;;   TONSILLECTOMY     TOTAL HIP ARTHROPLASTY Right 2017     Family Psychiatric History: I have reviewed family psychiatric history from progress note on 08/21/2019.  Family History:  Family History  Problem Relation Age of Onset   Lung cancer Mother    Heart attack Father 22   Mental illness Father    Bipolar disorder Brother    Bipolar disorder Paternal Uncle    Bipolar disorder Paternal Uncle     Social History: I have reviewed social history from progress note on 08/21/2019. Social History   Socioeconomic History   Marital status: Divorced    Spouse name: Not on file   Number of children: 2   Years of education: Not on file   Highest education level: High school graduate  Occupational History   Not on file  Tobacco Use   Smoking status: Former    Current packs/day: 0.00    Types: Cigarettes    Quit date: 03/03/2011    Years since quitting: 13.7   Smokeless tobacco: Never  Vaping Use   Vaping status: Never Used  Substance and Sexual Activity   Alcohol use: Yes    Comment: rarely   Drug use: Not Currently   Sexual activity: Not Currently  Other Topics Concern   Not on file  Social History Narrative   Not on file   Social Drivers of Health   Tobacco Use: Medium Risk (11/15/2024)   Patient History    Smoking Tobacco Use: Former    Smokeless Tobacco Use: Never    Passive Exposure: Not on Actuary Strain: Low Risk  (05/04/2024)   Received from [facility]   Overall Financial Resource Strain (CARDIA)    Difficulty of Paying Living Expenses: Not hard at all  Food Insecurity: No Food Insecurity (05/04/2024)   Received from [facility]   Epic    Within the past 12 months, you worried that your food would run out before you got the money to buy more.: Never true    Within the past 12 months, the food you bought just didn't last and you didn't have money to get more.: Never true  Transportation Needs: No Transportation Needs (05/04/2024)   Received from [facility] - Transportation    In the past 12 months, has lack of transportation kept you from medical appointments or from getting medications?: No    Lack of Transportation (Non-Medical): No  Physical Activity: Not on file  Stress: Not on file  Social Connections: Moderately Integrated (02/14/2024)   Social Connection and Isolation Panel    Frequency of Communication with Friends and Family: More than three times a week    Frequency of Social Gatherings with  Friends and Family: More than three times a week    Attends Religious Services: More than 4 times per year    Active Member of Clubs or Organizations: Yes    Attends Banker Meetings: More than 4 times per year    Marital Status: Divorced  Depression (PHQ2-9): Low Risk (11/13/2024)   Depression (PHQ2-9)    PHQ-2 Score: 0  Recent Concern: Depression (PHQ2-9) - Medium Risk (08/30/2024)   Depression (PHQ2-9)    PHQ-2 Score: 8  Alcohol Screen: Not on file  Housing: Low Risk  (05/04/2024)   Received from [facility]   Epic    In the last 12 months, was there a time when you were not able to pay the mortgage or rent on time?: No    In the past 12 months, how many times have you moved where you were living?: 0    At any time in the past 12 months, were you homeless or living in a shelter (including now)?: No  Utilities: Not At Risk (05/04/2024)   [facility]   Epic    In the past 12 months has the electric, gas, oil, or water  company threatened to shut off services in your home?: No  Health Literacy: Not on file    Allergies: Allergies[1]  Metabolic Disorder Labs: Lab Results  Component Value Date   HGBA1C 7.3 (H) 06/22/2019   MPG 162.81 06/22/2019   No results found for: PROLACTIN No results found for: CHOL, TRIG, HDL, CHOLHDL, VLDL, LDLCALC Lab Results  Component Value Date   TSH 1.463 12/29/2022   TSH 1.737 11/16/2019    Therapeutic Level  Labs: No results found for: LITHIUM No results found for: VALPROATE No results found for: CBMZ  Current Medications: Current Outpatient Medications  Medication Sig Dispense Refill   hydrOXYzine  (VISTARIL ) 25 MG capsule Take 1 capsule (25 mg total) by mouth 2 (two) times daily as needed for anxiety. 60 capsule 0   amLODipine  (NORVASC ) 5 MG tablet Take 5 mg by mouth daily.     ARIPiprazole  (ABILIFY ) 10 MG tablet Take 1 tablet (10 mg total) by mouth daily. 90 tablet 3   escitalopram  (LEXAPRO ) 10 MG tablet TAKE ONE TABLET BY MOUTH ONE TIME DAILY 90 tablet 3   furosemide (LASIX) 20 MG tablet Take 20 mg by mouth daily as needed for fluid or edema.     glimepiride (AMARYL) 2 MG tablet Take 2 mg by mouth every morning.     HYDROcodone -acetaminophen  (NORCO) 10-325 MG tablet Take 1 tablet by mouth every 8 (eight) hours as needed for severe pain (pain score 7-10). Must last 30 days. 90 tablet 0   lamoTRIgine  (LAMICTAL ) 200 MG tablet TAKE ONE TABLET BY MOUTH ONE TIME DAILY 90 tablet 3   lamoTRIgine  (LAMICTAL ) 25 MG tablet Take 1 tablet (25 mg total) by mouth daily. Take daily with 200 mg , total of 225 mg 90 tablet 1   metFORMIN (GLUCOPHAGE) 500 MG tablet Take 500 mg by mouth 2 (two) times daily with a meal.     ondansetron  (ZOFRAN ) 8 MG tablet Take 1 tablet (8 mg total) by mouth every 8 (eight) hours as needed for nausea or vomiting. 30 tablet 0   OZEMPIC, 1 MG/DOSE, 4 MG/3ML SOPN Inject 1 mg into the skin once a week. Sundays     pantoprazole  (PROTONIX ) 40 MG tablet Take 40 mg by mouth every morning.     polyethylene glycol powder (GLYCOLAX/MIRALAX)  17 GM/SCOOP powder Take 17 g by mouth daily.     spironolactone (ALDACTONE) 25 MG tablet Take 25 mg by mouth daily.     tizanidine (ZANAFLEX) 2 MG capsule Take 2 mg by mouth 3 (three) times daily as needed for muscle spasms. Takes differently (Patient not taking: Reported on 11/13/2024)     XIFAXAN  550 MG TABS tablet Take 550 mg by mouth 2 (two) times  daily.     No current facility-administered medications for this visit.   Facility-Administered Medications Ordered in Other Visits  Medication Dose Route Frequency Provider Last Rate Last Admin   sodium chloride  flush (NS) 0.9 % injection 10 mL  10 mL Intracatheter Once PRN Brahmanday, Govinda R, MD         Musculoskeletal: Strength & Muscle Tone: UTA Gait & Station: Seated Patient leans: N/A  Psychiatric Specialty Exam: Review of Systems  Psychiatric/Behavioral:  The patient is nervous/anxious.     There were no vitals taken for this visit.There is no height or weight on file to calculate BMI.  General Appearance: Casual  Eye Contact:  Fair  Speech:  Clear and Coherent  Volume:  Normal  Mood:  Anxious  Affect:  Congruent  Thought Process:  Goal Directed and Descriptions of Associations: Intact  Orientation:  Full (Time, Place, and Person)  Thought Content: Logical   Suicidal Thoughts:  No  Homicidal Thoughts:  No  Memory:  Immediate;   Fair Recent;   Fair Remote;   Fair  Judgement:  Fair  Insight:  Fair  Psychomotor Activity:  Normal  Concentration:  Concentration: Fair and Attention Span: Fair  Recall:  [employer] of Knowledge: Fair  Language: Fair  Akathisia:  No  Handed:  Right  AIMS (if indicated): not done  Assets:  Communication Skills Desire for Improvement Housing Social Support Talents/Skills  ADL's:  Intact  Cognition: WNL  Sleep:  varies, due to the need to void   Screenings: AIMS    Flowsheet Row Office Visit from 08/29/2024 in [facility] Office Visit from 07/23/2023 in [facility] Video Visit from 03/16/2023 in [facility] Office Visit from 10/13/2022 in [facility] Video Visit from 05/14/2022 in [facility]  AIMS Total Score 0 0 0 0 0   GAD-7     Flowsheet Row Office Visit from 08/29/2024 in [facility] Office Visit from 07/23/2023 in [facility] Office Visit from 10/13/2022 in [facility] Video Visit from 02/13/2022 in [facility]  Total GAD-7 Score 5 2 2 2    PHQ2-9    Flowsheet Row Infusion from 11/13/2024 in [facility] Of [place]. [facility] Most recent reading at 11/13/2024 10:50 AM Office Visit from 11/13/2024 in [facility] Of [place]. [facility] Most recent reading at 11/13/2024 10:12 AM Office Visit from 10/02/2024 in [facility] Of [place]. [facility] Most recent reading at 10/02/2024  3:23 PM Office Visit from 08/29/2024 in [facility] Most recent reading at 08/30/2024  8:32 AM Office Visit from 08/28/2024 in [facility] Most recent reading at 08/28/2024 11:02 AM  PHQ-2 Total  Score 0 0 0 2 1  PHQ-9 Total Score -- -- -- 8 --   Flowsheet Row Video Visit from 11/15/2024 in [facility] Office Visit from 11/13/2024 in [facility] Of [place]. [facility] Office Visit from 10/02/2024 in [facility] Of [place]. [facility]  C-SSRS RISK CATEGORY No Risk No Risk No Risk     Assessment and Plan: Margaret [name] is a 69 year old Caucasian female who presented for a follow-up appointment, discussed assessment and plan as noted below.  1. Bipolar disorder, in full remission, most recent episode manic Currently denies any significant mania or depression symptoms Continue Lamotrigine  225 mg daily Continue Abilify  10 mg daily Continue Benztropine  0.5 mg as needed  for tremors.  2. Other specified anxiety disorders-stable Episodic anxiety during situational stressors only otherwise doing fairly well. Start Hydroxyzine  25 mg twice a day as needed for severe anxiety attacks only Continue Lexapro  10 mg daily, reduced dosage. Discussed referral for CBT, patient agrees to let this provider know if interested.  Follow-up Follow-up in clinic in 3 months or sooner if needed.      Collaboration of Care: Collaboration of Care: Primary Care Provider AEB patient encouraged to follow-up with primary care provider and will benefit from repeat labs including hemoglobin A1c since patient is on medications like Abilify .  She agrees to get this completed at her upcoming visit beginning of February.  Patient/Guardian was advised Release of Information must be obtained prior to any record release in order to collaborate their care with an outside provider. Patient/Guardian was advised if they have not already done so to contact the registration department to sign all necessary forms in order for us  to release information regarding their care.   Consent: Patient/Guardian gives verbal consent for treatment and assignment of benefits for services provided during this visit. Patient/Guardian expressed understanding and agreed to proceed.   This note was generated in part or whole with voice recognition software. Voice recognition is usually quite accurate but there are transcription errors that can and very often do occur. I apologize for any typographical errors that were not detected and corrected.    Aralyn Nowak, MD 11/15/2024, 3:47 PM     [1]  Allergies Allergen Reactions   Lithium Nausea And Vomiting and Other (See Comments)   Penicillins Hives    Resulted in hospitalization   Sulfa Antibiotics Hives    Resulted in hospitalization   Benzonatate Other (See Comments)

## 2024-11-21 ENCOUNTER — Ambulatory Visit: Admitting: Nurse Practitioner

## 2024-11-21 ENCOUNTER — Encounter: Payer: Self-pay | Admitting: Nurse Practitioner

## 2024-11-21 VITALS — BP 141/48 | HR 64 | Temp 97.6°F | Resp 18 | Ht 60.0 in | Wt 170.0 lb

## 2024-11-21 DIAGNOSIS — Z96642 Presence of left artificial hip joint: Secondary | ICD-10-CM | POA: Diagnosis not present

## 2024-11-21 DIAGNOSIS — M25552 Pain in left hip: Secondary | ICD-10-CM | POA: Insufficient documentation

## 2024-11-21 DIAGNOSIS — Z79899 Other long term (current) drug therapy: Secondary | ICD-10-CM | POA: Insufficient documentation

## 2024-11-21 DIAGNOSIS — M5416 Radiculopathy, lumbar region: Secondary | ICD-10-CM | POA: Insufficient documentation

## 2024-11-21 DIAGNOSIS — M47816 Spondylosis without myelopathy or radiculopathy, lumbar region: Secondary | ICD-10-CM | POA: Diagnosis not present

## 2024-11-21 DIAGNOSIS — G8929 Other chronic pain: Secondary | ICD-10-CM | POA: Diagnosis present

## 2024-11-21 DIAGNOSIS — G894 Chronic pain syndrome: Secondary | ICD-10-CM | POA: Insufficient documentation

## 2024-11-21 DIAGNOSIS — Z79891 Long term (current) use of opiate analgesic: Secondary | ICD-10-CM | POA: Diagnosis not present

## 2024-11-21 MED ORDER — HYDROCODONE-ACETAMINOPHEN 10-325 MG PO TABS: 1.0000 | ORAL_TABLET | Freq: Three times a day (TID) | ORAL | 0 refills | Status: AC | PRN

## 2024-11-21 NOTE — Patient Instructions (Signed)

## 2024-11-21 NOTE — Progress Notes (Signed)
 PROVIDER NOTE: Interpretation of information contained herein should be left to medically-trained personnel. Specific patient instructions are provided elsewhere under Patient Instructions section of medical record. This document was created in part using AI and STT-dictation technology, any transcriptional errors that may result from this process are unintentional.  Patient: Margaret [name]  Service: E/M   PCP: Margaret [name] FALCON, MD  DOB: 12-30-1955  DOS: 11/21/2024  Provider: Emmy Margaret [name] [name], Margaret [name]  MRN: [id number]  Delivery: Face-to-face  Specialty: Interventional Pain Management  Type: Established Patient  Setting: Ambulatory outpatient facility  Specialty designation: 09  Referring Prov.: Margaret [name] FALCON, MD  Location: Outpatient office facility       History of present illness (HPI) Margaret [name], a 69 y.o. year old female, is here today because of her left hip pain. Margaret [name] primary complain today is Hip Pain (Left )  Pertinent problems: Margaret [name] has HTN (hypertension); Controlled type 2 diabetes mellitus without complication, without long-term current use of insulin  (HCC); 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H (nonalcoholic steatohepatitis) (HCC); Chronic pain syndrome; Encounter for long-term opiate analgesic use; SI (sacroiliac) joint dysfunction; Osteoporosis; Low back pain; Cervical radicular pain; Lumbar facet arthropathy; Neck pain on right side; Chronic right shoulder pain; Cervical facet joint syndrome; Renal cell carcinoma of right kidney (HCC); Chronic pain of left knee; and Chronic radicular lumbar pain on their pertinent problem list.  Pain Assessment: Severity of Chronic pain is reported as a 7 /10. Location: Hip Left/Denies. Onset: More than a month ago. Quality: Aching, Discomfort. Timing: Constant. Modifying factor(s): Pain medication eases the pain. Vitals:  height is 5' (1.524 m) and weight is 170 lb (77.1 kg). Her temporal temperature is 97.6 F (36.4 C). Her blood  pressure is 141/48 (abnormal) and her pulse is 64. Her respiration is 18 and oxygen saturation is 100%.  BMI: Estimated body mass index is 33.2 kg/m as calculated from the following:   Height as of this encounter: 5' (1.524 m).   Weight as of this encounter: 170 lb (77.1 kg).  Last encounter: 08/28/2024. Last procedure: Visit date not found.  Reason for encounter: medication management. No change in medical history since last visit.  Patient's pain is at baseline.  Patient continues multimodal pain regimen as prescribed.  States that it provides pain relief and improvement in functional status.  Discussed the use of AI scribe software for clinical note transcription with the patient, who gave verbal consent to proceed.  History of Present Illness   Margaret [name] is a 69 year old female who presents for pain management of chronic left hip pain.  She experiences a persistent deep ache in her left hip, rated as 7/10 in intensity, which does not radiate down the leg. The pain has been present since her first surgery in 2017, followed by a second surgery in 2018, both of which did not alleviate her symptoms. The pain is constant but reduces to a 6/10 with hydrocodone -acetaminophen  10-325 mg taken three times daily. She also uses Tylenol  for arthritis but cannot take ibuprofen due to liver concerns.  Functional limitations due to her hip pain include being able to walk only 10 to 15 minutes using a walker. She notes a leg length discrepancy following her surgeries, with the left leg being shorter, which affects her gait and increases pressure on the hip.  She also reports bilateral knee pain, with a sensation of the leg wanting to go backwards. She has received gel injections in the past, which  provided relief for about a year. She is scheduled for bilateral knee injections soon.  She reports that previous injections for her right hip were effective. Additionally, she experiences shoulder pain  with movement, which was previously relieved by an injection.  A history of a right foot fracture in September 2025 resulted in a displaced fracture of the second metatarsal bone. She wore a boot cast for eight weeks but still experiences swelling and deviation of the foot. She takes furosemide daily for swelling.  She reports a history of constipation when hospitalized, managed with Miralax. No current side effects from her pain medication.     Pharmacotherapy Assessment   Hydrocodone -acetaminophen  (Norco) 10-325 mg every 8 hours as needed for severe pain. MME=30  Monitoring: [place] PMP: PDMP reviewed during this encounter.       Pharmacotherapy: No side-effects or adverse reactions reported. Compliance: No problems identified. Effectiveness: Clinically acceptable.  Margaret [name], Margaret [name]  11/21/2024 11:15 AM  Sign when Signing Visit Nursing Pain Medication Assessment:  Safety precautions to be maintained throughout the outpatient stay will include: orient to surroundings, keep bed in low position, maintain call bell within reach at all times, provide assistance with transfer out of bed and ambulation.  Medication Inspection Compliance: Pill count conducted under aseptic conditions, in front of the patient. Neither the pills nor the bottle was removed from the patient's sight at any time. Once count was completed pills were immediately returned to the patient in their original bottle.  Medication: Hydrocodone /APAP Pill/Patch Count: 27 of 90 pills/patches remain Pill/Patch Appearance: Markings consistent with prescribed medication Bottle Appearance: Standard pharmacy container. Clearly labeled. Filled Date: 9 / 23 / 2025 Last Medication intake:  Today    UDS:  Summary  Date Value Ref Range Status  05/24/2024 FINAL  Final    Comment:    ==================================================================== [employer]  (MW) ==================================================================== Test                             Result       Flag       Units  Drug Present   Hydrocodone                     3076                    ng/mg creat   Dihydrocodeine                 465                     ng/mg creat   Norhydrocodone                 2311                    ng/mg creat    Sources of hydrocodone  include scheduled prescription medications.    Dihydrocodeine and norhydrocodone are expected metabolites of    hydrocodone . Dihydrocodeine is also available as a scheduled    prescription medication.  ==================================================================== Test                      Result    Flag   Units      Ref Range   Creatinine              46  mg/dL      >=79 ==================================================================== Declared Medications:  Medication list was not provided. ==================================================================== For clinical consultation, please call [phone]. ====================================================================     No results found for: CBDTHCR No results found for: D8THCCBX No results found for: D9THCCBX  ROS  Constitutional: Denies any fever or chills Gastrointestinal: No reported hemesis, hematochezia, vomiting, or acute GI distress Musculoskeletal: left hip pain Neurological: No reported episodes of acute onset apraxia, aphasia, dysarthria, agnosia, amnesia, paralysis, loss of coordination, or loss of consciousness  Medication Review  ARIPiprazole , HYDROcodone -acetaminophen , Semaglutide (1 MG/DOSE), amLODipine , escitalopram , furosemide, glimepiride, hydrOXYzine , lamoTRIgine , metFORMIN, ondansetron , pantoprazole , polyethylene glycol powder, rifaximin , spironolactone, and tizanidine  History Review  Allergy: Margaret [name] is allergic to lithium, penicillins, sulfa antibiotics, and benzonatate. Drug:  Margaret [name]  reports that she does not currently use drugs. Alcohol:  reports current alcohol use. Tobacco:  reports that she quit smoking about 13 years ago. Her smoking use included cigarettes. She has never used smokeless tobacco. Social: Ms. Piechocki  reports that she quit smoking about 13 years ago. Her smoking use included cigarettes. She has never used smokeless tobacco. She reports current alcohol use. She reports that she does not currently use drugs. Medical:  has a past medical history of (HFpEF) heart failure with preserved ejection fraction (HCC), Anxiety, Aortic atherosclerosis, Aortic stenosis, Bilateral carotid artery disease, Bilateral renal cysts, Bipolar 1 disorder (HCC), CAD (coronary artery disease) (11/04/2022), Cardiac murmur, CKD (chronic kidney disease), stage III (HCC), DDD (degenerative disc disease), lumbar, Depression, DM (diabetes mellitus), type 2 (HCC), Former smoker, Frequent falls, GERD (gastroesophageal reflux disease), H/O: GI bleed, Heart murmur, History of blood transfusion, History of MRSA infection (2017), HLD (hyperlipidemia), Hypertension, IDA (iron  deficiency anemia), 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H [facility]), Multiple pulmonary nodules, Nephrolithiasis, Occasional tremors, Osteoarthritis, Osteoporosis, Portal hypertensive gastropathy (HCC), Portal venous hypertension (HCC), Renal mass, 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 Surgical: Margaret [name]  has a past surgical history that includes Abdominal hysterectomy; Tonsillectomy; Cholecystectomy; Esophagogastroduodenoscopy (egd) with propofol  (N/A, 06/23/2019); Colonoscopy with propofol  (N/A, 06/23/2019); Esophagogastroduodenoscopy (egd) with propofol  (N/A, 12/08/2022); Colonoscopy with propofol  (N/A, 12/08/2022); Esophagogastroduodenoscopy (egd) with propofol  (N/A, 01/12/2023); Colonoscopy with propofol  (N/A,  05/18/2023); polypectomy (05/18/2023); IR Radiologist Eval & Mgmt (10/28/2023); Total hip arthroplasty (Right, 2017); IR Radiologist Eval & Mgmt (01/18/2024); Esophagogastroduodenoscopy (N/A, 02/15/2024); Colonoscopy (N/A, 02/15/2024); Colonoscopy (02/15/2024); Givens capsule study (02/15/2024); Polypectomy (02/15/2024); Hot hemostasis (02/15/2024); Esophagogastroduodenoscopy (N/A, 07/04/2024); and IR Radiologist Eval & Mgmt (08/15/2024). Family: family history includes Bipolar disorder in her brother, paternal uncle, and paternal uncle; Heart attack (age of onset: 67) in her father; Lung cancer in her mother; Mental illness in her father.  Laboratory Chemistry Profile   Renal Lab Results  Component Value Date   BUN 32 (H) 10/02/2024   CREATININE 1.02 (H) 10/02/2024   BCR 19 12/06/2019   GFRAA 114 12/06/2019   GFRNONAA >60 10/02/2024    Hepatic Lab Results  Component Value Date   AST 38 10/02/2024   ALT 37 10/02/2024   ALBUMIN  3.8 10/02/2024   ALKPHOS 122 10/02/2024    Electrolytes Lab Results  Component Value Date   NA 132 (L) 10/02/2024   [name] 4.2 10/02/2024   CL 98 10/02/2024   CALCIUM 9.0 10/02/2024    Bone No results found for: VD25OH, VD125OH2TOT, CI6874NY7, CI7874NY7, 25OHVITD1, 25OHVITD2, 25OHVITD3, TESTOFREE, TESTOSTERONE  Inflammation (CRP: Acute Phase) (ESR: Chronic Phase) No results found for: CRP, ESRSEDRATE, LATICACIDVEN  Note: Above Lab results reviewed.  Recent Imaging Review  IR Radiologist Eval & Mgmt EXAM: Margaret PATIENT OFFICE VISIT  CHIEF COMPLAINT: SEE NOTE IN EPIC  HISTORY OF PRESENT ILLNESS: SEE NOTE IN EPIC  REVIEW OF SYSTEMS: SEE NOTE IN EPIC  PHYSICAL EXAMINATION: SEE NOTE IN EPIC  ASSESSMENT AND PLAN: SEE NOTE IN EPIC  Electronically Signed   By: Wilkie Lent M.D.   On: 09/12/2024 08:46 Note: Reviewed        Physical Exam  Vitals: BP (!) 141/48 (BP Location: Right Arm, Patient Position: Sitting, Cuff Size:  Normal)   Pulse 64   Temp 97.6 F (36.4 C) (Temporal)   Resp 18   Ht 5' (1.524 m)   Wt 170 lb (77.1 kg)   SpO2 100%   BMI 33.20 kg/m  BMI: Estimated body mass index is 33.2 kg/m as calculated from the following:   Height as of this encounter: 5' (1.524 m).   Weight as of this encounter: 170 lb (77.1 kg). Ideal: Ideal body weight: 45.5 kg (100 lb 4.9 oz) Adjusted ideal body weight: 58.1 kg (128 lb 3 oz) General appearance: Well nourished, well developed, and well hydrated. In no apparent acute distress Mental status: Alert, oriented x 3 (person, place, & time)       Respiratory: No evidence of acute respiratory distress Eyes: PERLA  Musculoskeletal: Left hip pain Assessment   Diagnosis Status  1. Left hip pain   2. Medication management   3. Encounter for long-term opiate analgesic use   4. History of left hip replacement (2017)   5. Lumbar facet arthropathy   6. Chronic radicular lumbar pain   7. 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 - Referred to primary care for shoe lift prescription. - Continue hydrocodone  and Tylenol , max 4000 mg Tylenol /day.  Bilateral knee osteoarthritis with pain Bilateral knee pain with instability, previous gel injections effective for one year. - Scheduled bilateral knee injections with Dr. Marcelino  Chronic right foot pain and deformity after fracture Chronic right foot pain and deformity with persistent swelling post-fracture. - Follow-up with podiatrist for foot deformity and swelling. - Continue furosemide for swelling. - Use RICE (Rest, ice, compression,elevation) for swelling management.  Chronic pain syndrome with long-term opioid  therapy Chronic pain syndrome managed with hydrocodone  and Tylenol , no side effects except constipation during hospitalization. - Continue hydrocodone -acetaminophen  and educate patient max 4000 mg Tylenol /day, do not exceed 4000 mg in 24 hours. Patient's pain is controlled with hydrocodone , will continue on current medication regimen.  Prescribing drug monitoring (PMP) reviewed, findings consistent with the use of prescribed medication and no evidence of narcotic misuse or abuse.  Urine drug screening (UDS) up to date and consistent with prescribed medication.  No side effects or any adverse reaction reported to medication, however, the patient was advised to drink water  to prevent from opioid induced constipation.  Schedule follow-up in 90 days for medication management.      Margaret [name] has a current medication list which includes the following long-term medication(s): aripiprazole , escitalopram , furosemide, lamotrigine , lamotrigine , metformin, pantoprazole , and spironolactone.  Pharmacotherapy (Medications Ordered): Meds ordered this encounter  Medications   HYDROcodone -acetaminophen  (NORCO) 10-325 MG tablet  Sig: Take 1 tablet by mouth every 8 (eight) hours as needed for severe pain (pain score 7-10). Must last 30 days.    Dispense:  90 tablet    Refill:  0    Chronic Pain: STOP Act (Not applicable) Fill 1 day early if closed on refill date. Avoid benzodiazepines within 8 hours of opioids   HYDROcodone -acetaminophen  (NORCO) 10-325 MG tablet    Sig: Take 1 tablet by mouth every 8 (eight) hours as needed for severe pain (pain score 7-10). Must last 30 days.    Dispense:  90 tablet    Refill:  0    Chronic Pain: STOP Act (Not applicable) Fill 1 day early if closed on refill date. Avoid benzodiazepines within 8 hours of opioids   HYDROcodone -acetaminophen  (NORCO) 10-325 MG tablet    Sig: Take 1 tablet by mouth every 8 (eight) hours as needed for severe pain (pain score 7-10). Must  last 30 days.    Dispense:  90 tablet    Refill:  0    Chronic Pain: STOP Act (Not applicable) Fill 1 day early if closed on refill date. Avoid benzodiazepines within 8 hours of opioids   Orders:  No orders of the defined types were placed in this encounter.       Return in about 3 months (around 02/19/2025) for (F2F), (MM), Margaret [name] Margaret [name].    Recent Visits Date Type Provider Dept  08/28/24 Office Visit Margaret [name] [name], Margaret [name] [facility]  Showing recent visits within past 90 days and meeting all other requirements Today's Visits Date Type Provider Dept  11/21/24 Office Visit Margaret Krenzer [name], Margaret [name] [facility]  Showing today's visits and meeting all other requirements Future Appointments Date Type Provider Dept  11/27/24 Appointment Marcelino Nurse, MD [facility]  02/12/25 Appointment Margaret Holderman [name], Margaret [name] [facility]  Showing future appointments within next 90 days and meeting all other requirements  I discussed the assessment and treatment plan with the patient. The patient was provided an opportunity to ask questions and all were answered. The patient agreed with the plan and demonstrated an understanding of the instructions.  Patient advised to call back or seek an in-person evaluation if the symptoms or condition worsens. I personally spent a total of 30 minutes in the care of the patient today including preparing to see the patient, getting/reviewing separately obtained history, performing a medically appropriate exam/evaluation, counseling and educating, placing orders, referring and communicating with other health care professionals, documenting clinical information in the EHR, independently interpreting results, communicating results, and coordinating care.   Note by: Magaret Justo [name] Kiondre Grenz, Margaret [name] (TTS and AI technology used. I apologize for any typographical errors that were not detected and corrected.) Date: 11/21/2024; Time: 2:21 PM

## 2024-11-21 NOTE — Progress Notes (Signed)
 Nursing Pain Medication Assessment:  Safety precautions to be maintained throughout the outpatient stay will include: orient to surroundings, keep bed in low position, maintain call bell within reach at all times, provide assistance with transfer out of bed and ambulation.  Medication Inspection Compliance: Pill count conducted under aseptic conditions, in front of the patient. Neither the pills nor the bottle was removed from the patient's sight at any time. Once count was completed pills were immediately returned to the patient in their original bottle.  Medication: Hydrocodone /APAP Pill/Patch Count: 27 of 90 pills/patches remain Pill/Patch Appearance: Markings consistent with prescribed medication Bottle Appearance: Standard pharmacy container. Clearly labeled. Filled Date: 37 / 24 / 2025 Last Medication intake:  Today

## 2024-11-24 ENCOUNTER — Other Ambulatory Visit: Payer: Self-pay | Admitting: Gastroenterology

## 2024-11-24 DIAGNOSIS — R188 Other ascites: Secondary | ICD-10-CM

## 2024-11-27 ENCOUNTER — Encounter: Admitting: Nurse Practitioner

## 2024-11-27 ENCOUNTER — Ambulatory Visit: Admitting: Student in an Organized Health Care Education/Training Program

## 2024-12-04 ENCOUNTER — Inpatient Hospital Stay

## 2024-12-08 ENCOUNTER — Ambulatory Visit: Admission: RE | Admit: 2024-12-08 | Source: Ambulatory Visit

## 2024-12-13 ENCOUNTER — Inpatient Hospital Stay

## 2024-12-22 ENCOUNTER — Ambulatory Visit

## 2024-12-26 ENCOUNTER — Inpatient Hospital Stay: Admitting: Internal Medicine

## 2024-12-26 ENCOUNTER — Inpatient Hospital Stay

## 2024-12-27 ENCOUNTER — Inpatient Hospital Stay

## 2025-01-04 ENCOUNTER — Ambulatory Visit: Admitting: Cardiology

## 2025-02-12 ENCOUNTER — Encounter: Admitting: Nurse Practitioner

## 2025-02-14 ENCOUNTER — Telehealth: Admitting: Psychiatry
